# Patient Record
Sex: Male | Born: 1945 | Race: Black or African American | Hispanic: No | Marital: Married | Smoking: Current every day smoker
Health system: Southern US, Community
[De-identification: ages and names within clinical notes are randomized; demographics above are authoritative.]

## PROBLEM LIST (undated history)

## (undated) DIAGNOSIS — K635 Polyp of colon: Secondary | ICD-10-CM

## (undated) DIAGNOSIS — J449 Chronic obstructive pulmonary disease, unspecified: Secondary | ICD-10-CM

## (undated) DIAGNOSIS — M1990 Unspecified osteoarthritis, unspecified site: Secondary | ICD-10-CM

## (undated) DIAGNOSIS — K922 Gastrointestinal hemorrhage, unspecified: Secondary | ICD-10-CM

## (undated) DIAGNOSIS — I10 Essential (primary) hypertension: Secondary | ICD-10-CM

## (undated) DIAGNOSIS — I509 Heart failure, unspecified: Secondary | ICD-10-CM

## (undated) DIAGNOSIS — I3139 Other pericardial effusion (noninflammatory): Secondary | ICD-10-CM

## (undated) DIAGNOSIS — I5189 Other ill-defined heart diseases: Secondary | ICD-10-CM

## (undated) DIAGNOSIS — I469 Cardiac arrest, cause unspecified: Secondary | ICD-10-CM

## (undated) DIAGNOSIS — N184 Chronic kidney disease, stage 4 (severe): Secondary | ICD-10-CM

## (undated) DIAGNOSIS — I639 Cerebral infarction, unspecified: Secondary | ICD-10-CM

## (undated) DIAGNOSIS — N400 Enlarged prostate without lower urinary tract symptoms: Secondary | ICD-10-CM

## (undated) DIAGNOSIS — H547 Unspecified visual loss: Secondary | ICD-10-CM

## (undated) DIAGNOSIS — I96 Gangrene, not elsewhere classified: Secondary | ICD-10-CM

## (undated) DIAGNOSIS — H3552 Pigmentary retinal dystrophy: Secondary | ICD-10-CM

## (undated) DIAGNOSIS — I499 Cardiac arrhythmia, unspecified: Secondary | ICD-10-CM

## (undated) DIAGNOSIS — I313 Pericardial effusion (noninflammatory): Secondary | ICD-10-CM

## (undated) DIAGNOSIS — D649 Anemia, unspecified: Secondary | ICD-10-CM

## (undated) HISTORY — DX: Chronic obstructive pulmonary disease, unspecified: J44.9

## (undated) HISTORY — PX: CATARACT EXTRACTION W/ INTRAOCULAR LENS  IMPLANT, BILATERAL: SHX1307

## (undated) HISTORY — DX: Gastrointestinal hemorrhage, unspecified: K92.2

## (undated) HISTORY — DX: Unspecified osteoarthritis, unspecified site: M19.90

## (undated) HISTORY — DX: Cerebral infarction, unspecified: I63.9

## (undated) HISTORY — DX: Other ill-defined heart diseases: I51.89

## (undated) HISTORY — DX: Anemia, unspecified: D64.9

## (undated) HISTORY — PX: KNEE LIGAMENT RECONSTRUCTION: SHX1895

## (undated) HISTORY — DX: Pigmentary retinal dystrophy: H35.52

## (undated) HISTORY — DX: Essential (primary) hypertension: I10

---

## 1999-04-26 ENCOUNTER — Encounter: Payer: Self-pay | Admitting: Internal Medicine

## 1999-04-26 ENCOUNTER — Ambulatory Visit (HOSPITAL_COMMUNITY): Admission: RE | Admit: 1999-04-26 | Discharge: 1999-04-26 | Payer: Self-pay | Admitting: Internal Medicine

## 1999-11-08 ENCOUNTER — Ambulatory Visit (HOSPITAL_COMMUNITY): Admission: RE | Admit: 1999-11-08 | Discharge: 1999-11-08 | Payer: Self-pay | Admitting: Gastroenterology

## 1999-11-08 ENCOUNTER — Encounter (INDEPENDENT_AMBULATORY_CARE_PROVIDER_SITE_OTHER): Payer: Self-pay | Admitting: *Deleted

## 1999-12-20 ENCOUNTER — Encounter: Admission: RE | Admit: 1999-12-20 | Discharge: 2000-01-05 | Payer: Self-pay | Admitting: Internal Medicine

## 1999-12-20 ENCOUNTER — Encounter: Payer: Self-pay | Admitting: Internal Medicine

## 1999-12-20 ENCOUNTER — Ambulatory Visit (HOSPITAL_COMMUNITY): Admission: RE | Admit: 1999-12-20 | Discharge: 1999-12-20 | Payer: Self-pay | Admitting: Internal Medicine

## 2002-02-12 ENCOUNTER — Ambulatory Visit (HOSPITAL_COMMUNITY): Admission: RE | Admit: 2002-02-12 | Discharge: 2002-02-12 | Payer: Self-pay | Admitting: Gastroenterology

## 2002-02-12 ENCOUNTER — Encounter (INDEPENDENT_AMBULATORY_CARE_PROVIDER_SITE_OTHER): Payer: Self-pay | Admitting: *Deleted

## 2004-10-13 ENCOUNTER — Ambulatory Visit: Payer: Self-pay | Admitting: *Deleted

## 2004-12-20 ENCOUNTER — Ambulatory Visit: Payer: Self-pay | Admitting: Family Medicine

## 2004-12-27 ENCOUNTER — Ambulatory Visit (HOSPITAL_COMMUNITY): Admission: RE | Admit: 2004-12-27 | Discharge: 2004-12-27 | Payer: Self-pay | Admitting: Family Medicine

## 2005-10-17 ENCOUNTER — Ambulatory Visit: Payer: Self-pay | Admitting: Family Medicine

## 2005-12-18 ENCOUNTER — Ambulatory Visit: Payer: Self-pay | Admitting: Family Medicine

## 2006-05-08 ENCOUNTER — Ambulatory Visit: Payer: Self-pay | Admitting: Family Medicine

## 2006-05-15 ENCOUNTER — Ambulatory Visit (HOSPITAL_COMMUNITY): Admission: RE | Admit: 2006-05-15 | Discharge: 2006-05-15 | Payer: Self-pay | Admitting: Family Medicine

## 2007-09-24 ENCOUNTER — Ambulatory Visit: Payer: Self-pay | Admitting: Family Medicine

## 2007-11-04 ENCOUNTER — Ambulatory Visit: Payer: Self-pay | Admitting: Family Medicine

## 2007-11-04 LAB — CONVERTED CEMR LAB
Albumin: 4.1 g/dL (ref 3.5–5.2)
BUN: 17 mg/dL (ref 6–23)
Chloride: 110 meq/L (ref 96–112)
Cholesterol: 106 mg/dL (ref 0–200)
Eosinophils Relative: 0 % (ref 0–5)
HCT: 27.3 % — ABNORMAL LOW (ref 39.0–52.0)
HDL: 23 mg/dL — ABNORMAL LOW (ref >39)
Lymphocytes Relative: 20 % (ref 12–46)
Lymphs Abs: 1.3 10*3/uL (ref 0.7–4.0)
MCHC: 26.7 g/dL — ABNORMAL LOW (ref 30.0–36.0)
MCV: 78.2 fL (ref 78.0–100.0)
Monocytes Relative: 8 % (ref 3–12)
Neutro Abs: 4.6 10*3/uL (ref 1.7–7.7)
Neutrophils Relative %: 71 % (ref 43–77)
PSA: 0.79 ng/mL (ref 0.10–4.00)
Platelets: 96 10*3/uL — ABNORMAL LOW (ref 150–400)
Total Bilirubin: 0.3 mg/dL (ref 0.3–1.2)
Total CHOL/HDL Ratio: 4.6
VLDL: 32 mg/dL (ref 0–40)

## 2007-11-05 ENCOUNTER — Encounter (INDEPENDENT_AMBULATORY_CARE_PROVIDER_SITE_OTHER): Payer: Self-pay | Admitting: Family Medicine

## 2007-11-05 LAB — CONVERTED CEMR LAB
Iron: 15 ug/dL — ABNORMAL LOW (ref 42–165)
Saturation Ratios: 4 % — ABNORMAL LOW (ref 20–55)
TIBC: 373 ug/dL (ref 215–435)

## 2008-09-22 ENCOUNTER — Ambulatory Visit: Payer: Self-pay | Admitting: Family Medicine

## 2008-11-24 ENCOUNTER — Ambulatory Visit: Payer: Self-pay | Admitting: Family Medicine

## 2008-11-24 LAB — CONVERTED CEMR LAB
ALT: 18 units/L (ref 0–53)
AST: 15 units/L (ref 0–37)
Alkaline Phosphatase: 68 units/L (ref 39–117)
Basophils Relative: 0 % (ref 0–1)
CO2: 20 meq/L (ref 19–32)
Chloride: 110 meq/L (ref 96–112)
MCHC: 29.8 g/dL — ABNORMAL LOW (ref 30.0–36.0)
MCV: 88.4 fL (ref 78.0–100.0)
Monocytes Absolute: 0.6 10*3/uL (ref 0.1–1.0)
Monocytes Relative: 9 % (ref 3–12)
Potassium: 4.7 meq/L (ref 3.5–5.3)
RDW: 17.7 % — ABNORMAL HIGH (ref 11.5–15.5)
Total Bilirubin: 0.3 mg/dL (ref 0.3–1.2)
Triglycerides: 330 mg/dL — ABNORMAL HIGH (ref <150)
WBC: 6.9 10*3/uL (ref 4.0–10.5)

## 2009-01-05 ENCOUNTER — Ambulatory Visit: Payer: Self-pay | Admitting: Family Medicine

## 2009-01-05 LAB — CONVERTED CEMR LAB
Basophils Absolute: 0 10*3/uL (ref 0.0–0.1)
Basophils Relative: 0 % (ref 0–1)
Eosinophils Absolute: 0 10*3/uL (ref 0.0–0.7)
Eosinophils Relative: 0 % (ref 0–5)
HCT: 36.5 % — ABNORMAL LOW (ref 39.0–52.0)
Lymphocytes Relative: 23 % (ref 12–46)
Lymphs Abs: 1.6 10*3/uL (ref 0.7–4.0)
MCV: 89.2 fL (ref 78.0–100.0)
Monocytes Absolute: 0.6 10*3/uL (ref 0.1–1.0)
Neutro Abs: 4.9 10*3/uL (ref 1.7–7.7)
Platelets: 81 10*3/uL — ABNORMAL LOW (ref 150–400)
RBC: 4.09 M/uL — ABNORMAL LOW (ref 4.22–5.81)
WBC: 7.1 10*3/uL (ref 4.0–10.5)

## 2009-01-25 ENCOUNTER — Ambulatory Visit: Payer: Self-pay | Admitting: Hematology & Oncology

## 2009-02-09 ENCOUNTER — Ambulatory Visit: Payer: Self-pay | Admitting: Hematology and Oncology

## 2009-03-09 LAB — CBC & DIFF AND RETIC
BASO%: 0.3 % (ref 0.0–2.0)
EOS%: 0 % (ref 0.0–7.0)
IRF: 0.48 — ABNORMAL HIGH (ref 0.070–0.380)
MCH: 27.3 pg (ref 27.2–33.4)
MCHC: 31.5 g/dL — ABNORMAL LOW (ref 32.0–36.0)
MONO#: 0.5 10*3/uL (ref 0.1–0.9)
RBC: 3.88 10*6/uL — ABNORMAL LOW (ref 4.20–5.82)
RDW: 16.4 % — ABNORMAL HIGH (ref 11.0–14.6)
RETIC #: 114.8 10*3/uL — ABNORMAL HIGH (ref 31.8–103.9)
WBC: 6 10*3/uL (ref 4.0–10.3)
lymph#: 1.5 10*3/uL (ref 0.9–3.3)
nRBC: 0 % (ref 0–0)

## 2009-03-09 LAB — URINALYSIS, MICROSCOPIC - CHCC
Bilirubin (Urine): NEGATIVE
Blood: NEGATIVE
Glucose: NEGATIVE g/dL
Nitrite: NEGATIVE

## 2009-03-09 LAB — TECHNOLOGIST REVIEW

## 2009-03-11 ENCOUNTER — Ambulatory Visit (HOSPITAL_COMMUNITY): Admission: RE | Admit: 2009-03-11 | Discharge: 2009-03-11 | Payer: Self-pay | Admitting: Hematology and Oncology

## 2009-03-11 LAB — PROTEIN ELECTROPHORESIS, SERUM, WITH REFLEX
Alpha-1-Globulin: 4.2 % (ref 2.9–4.9)
Alpha-2-Globulin: 8.3 % (ref 7.1–11.8)
Gamma Globulin: 21.8 % — ABNORMAL HIGH (ref 11.1–18.8)
Total Protein, Serum Electrophoresis: 7.9 g/dL (ref 6.0–8.3)

## 2009-03-11 LAB — IRON AND TIBC
%SAT: 10 % — ABNORMAL LOW (ref 20–55)
TIBC: 347 ug/dL (ref 215–435)
UIBC: 313 ug/dL

## 2009-03-11 LAB — HEMOGLOBINOPATHY EVALUATION
Hemoglobin Other: 0 % (ref 0.0–0.0)
Hgb A2 Quant: 2.6 % (ref 2.2–3.2)
Hgb F Quant: 0 % (ref 0.0–2.0)

## 2009-03-11 LAB — DIRECT ANTIGLOBULIN TEST (NOT AT ARMC): DAT IgG: NEGATIVE

## 2009-03-11 LAB — HAPTOGLOBIN: Haptoglobin: 178 mg/dL (ref 16–200)

## 2009-03-11 LAB — VITAMIN B12: Vitamin B-12: 301 pg/mL (ref 211–911)

## 2009-03-16 ENCOUNTER — Encounter (INDEPENDENT_AMBULATORY_CARE_PROVIDER_SITE_OTHER): Payer: Self-pay | Admitting: Interventional Radiology

## 2009-03-16 ENCOUNTER — Ambulatory Visit (HOSPITAL_COMMUNITY): Admission: RE | Admit: 2009-03-16 | Discharge: 2009-03-16 | Payer: Self-pay | Admitting: Hematology and Oncology

## 2009-04-06 ENCOUNTER — Ambulatory Visit: Payer: Self-pay | Admitting: Hematology and Oncology

## 2009-05-18 ENCOUNTER — Ambulatory Visit (HOSPITAL_COMMUNITY): Admission: RE | Admit: 2009-05-18 | Discharge: 2009-05-18 | Payer: Self-pay | Admitting: Hematology and Oncology

## 2009-05-18 LAB — BASIC METABOLIC PANEL
CO2: 25 mEq/L (ref 19–32)
Calcium: 9 mg/dL (ref 8.4–10.5)
Sodium: 139 mEq/L (ref 135–145)

## 2009-05-18 LAB — IRON AND TIBC
%SAT: 22 % (ref 20–55)
TIBC: 234 ug/dL (ref 215–435)
UIBC: 183 ug/dL

## 2009-05-18 LAB — CBC WITH DIFFERENTIAL/PLATELET
BASO%: 0.2 % (ref 0.0–2.0)
EOS%: 0.2 % (ref 0.0–7.0)
Eosinophils Absolute: 0 10*3/uL (ref 0.0–0.5)
LYMPH%: 22.5 % (ref 14.0–49.0)
MCH: 29.1 pg (ref 27.2–33.4)
MCHC: 33.1 g/dL (ref 32.0–36.0)
MCV: 87.8 fL (ref 79.3–98.0)
MONO%: 8.4 % (ref 0.0–14.0)
NEUT#: 4.4 10*3/uL (ref 1.5–6.5)
RBC: 4.02 10*6/uL — ABNORMAL LOW (ref 4.20–5.82)
RDW: 17.5 % — ABNORMAL HIGH (ref 11.0–14.6)
nRBC: 0 % (ref 0–0)

## 2009-05-21 ENCOUNTER — Ambulatory Visit: Payer: Self-pay | Admitting: Hematology and Oncology

## 2009-09-10 ENCOUNTER — Ambulatory Visit: Payer: Self-pay | Admitting: Oncology

## 2009-09-15 ENCOUNTER — Encounter: Payer: Self-pay | Admitting: Internal Medicine

## 2009-09-15 ENCOUNTER — Ambulatory Visit: Payer: Self-pay | Admitting: Internal Medicine

## 2009-09-15 ENCOUNTER — Inpatient Hospital Stay (HOSPITAL_COMMUNITY): Admission: EM | Admit: 2009-09-15 | Discharge: 2009-09-20 | Payer: Self-pay | Admitting: Emergency Medicine

## 2009-09-17 ENCOUNTER — Encounter: Payer: Self-pay | Admitting: Cardiovascular Disease

## 2009-09-21 ENCOUNTER — Ambulatory Visit: Payer: Self-pay | Admitting: Cardiovascular Disease

## 2009-09-27 ENCOUNTER — Telehealth (INDEPENDENT_AMBULATORY_CARE_PROVIDER_SITE_OTHER): Payer: Self-pay | Admitting: *Deleted

## 2009-09-28 ENCOUNTER — Ambulatory Visit: Payer: Self-pay | Admitting: Cardiology

## 2009-09-28 ENCOUNTER — Encounter (HOSPITAL_COMMUNITY): Admission: RE | Admit: 2009-09-28 | Discharge: 2009-12-09 | Payer: Self-pay | Admitting: Internal Medicine

## 2009-09-28 ENCOUNTER — Ambulatory Visit: Payer: Self-pay

## 2009-09-28 DIAGNOSIS — M109 Gout, unspecified: Secondary | ICD-10-CM

## 2009-09-28 DIAGNOSIS — I110 Hypertensive heart disease with heart failure: Secondary | ICD-10-CM | POA: Insufficient documentation

## 2009-09-28 DIAGNOSIS — I313 Pericardial effusion (noninflammatory): Secondary | ICD-10-CM

## 2009-09-28 DIAGNOSIS — I509 Heart failure, unspecified: Secondary | ICD-10-CM

## 2009-09-30 ENCOUNTER — Encounter: Payer: Self-pay | Admitting: Cardiology

## 2009-09-30 ENCOUNTER — Ambulatory Visit: Payer: Self-pay

## 2009-10-01 DIAGNOSIS — Z862 Personal history of diseases of the blood and blood-forming organs and certain disorders involving the immune mechanism: Secondary | ICD-10-CM

## 2009-10-01 DIAGNOSIS — H548 Legal blindness, as defined in USA: Secondary | ICD-10-CM | POA: Insufficient documentation

## 2009-10-01 DIAGNOSIS — M1990 Unspecified osteoarthritis, unspecified site: Secondary | ICD-10-CM | POA: Insufficient documentation

## 2009-10-03 LAB — CONVERTED CEMR LAB
Basophils Relative: 0.7 % (ref 0.0–3.0)
CO2: 20 meq/L (ref 19–32)
Calcium: 8.6 mg/dL (ref 8.4–10.5)
Chloride: 112 meq/L (ref 96–112)
Creatinine, Ser: 2.2 mg/dL — ABNORMAL HIGH (ref 0.4–1.5)
Eosinophils Relative: 0.3 % (ref 0.0–5.0)
Hemoglobin: 10.8 g/dL — ABNORMAL LOW (ref 13.0–17.0)
MCV: 95 fL (ref 78.0–100.0)
Monocytes Relative: 10 % (ref 3.0–12.0)
Neutrophils Relative %: 61.7 % (ref 43.0–77.0)
Platelets: 142 10*3/uL — ABNORMAL LOW (ref 150.0–400.0)
Potassium: 4.6 meq/L (ref 3.5–5.1)

## 2009-10-04 ENCOUNTER — Ambulatory Visit: Payer: Self-pay | Admitting: Cardiology

## 2009-10-04 ENCOUNTER — Ambulatory Visit: Payer: Self-pay | Admitting: Cardiovascular Disease

## 2009-10-04 DIAGNOSIS — I5033 Acute on chronic diastolic (congestive) heart failure: Secondary | ICD-10-CM

## 2009-10-04 DIAGNOSIS — I10 Essential (primary) hypertension: Secondary | ICD-10-CM

## 2009-10-05 LAB — CONVERTED CEMR LAB
Creatinine, Ser: 2.7 mg/dL — ABNORMAL HIGH (ref 0.4–1.5)
GFR calc non Af Amer: 30.8 mL/min (ref >60)
Potassium: 4.3 meq/L (ref 3.5–5.1)
Sodium: 142 meq/L (ref 135–145)

## 2009-10-11 ENCOUNTER — Ambulatory Visit: Payer: Self-pay | Admitting: Cardiovascular Disease

## 2009-10-13 ENCOUNTER — Encounter: Payer: Self-pay | Admitting: Family Medicine

## 2009-10-13 ENCOUNTER — Encounter: Payer: Self-pay | Admitting: Cardiovascular Disease

## 2009-10-13 ENCOUNTER — Emergency Department (HOSPITAL_COMMUNITY): Admission: EM | Admit: 2009-10-13 | Discharge: 2009-10-14 | Payer: Self-pay | Admitting: Emergency Medicine

## 2009-10-13 LAB — CONVERTED CEMR LAB
BUN: 54 mg/dL — ABNORMAL HIGH (ref 6–23)
Glucose, Bld: 127 mg/dL — ABNORMAL HIGH (ref 70–99)
Potassium: 5.9 meq/L — ABNORMAL HIGH (ref 3.5–5.1)
Sodium: 143 meq/L (ref 135–145)

## 2009-10-14 ENCOUNTER — Ambulatory Visit: Payer: Self-pay | Admitting: Internal Medicine

## 2009-10-14 ENCOUNTER — Ambulatory Visit (HOSPITAL_COMMUNITY): Admission: RE | Admit: 2009-10-14 | Discharge: 2009-10-14 | Payer: Self-pay | Admitting: Cardiovascular Disease

## 2009-10-14 ENCOUNTER — Encounter: Payer: Self-pay | Admitting: Cardiovascular Disease

## 2009-10-14 ENCOUNTER — Ambulatory Visit: Payer: Self-pay

## 2009-10-27 ENCOUNTER — Encounter: Payer: Self-pay | Admitting: Cardiology

## 2009-10-27 ENCOUNTER — Telehealth (INDEPENDENT_AMBULATORY_CARE_PROVIDER_SITE_OTHER): Payer: Self-pay | Admitting: *Deleted

## 2009-11-02 ENCOUNTER — Encounter: Payer: Self-pay | Admitting: Cardiology

## 2009-12-30 ENCOUNTER — Encounter (HOSPITAL_COMMUNITY): Admission: RE | Admit: 2009-12-30 | Discharge: 2010-03-30 | Payer: Self-pay | Admitting: Nephrology

## 2010-04-19 ENCOUNTER — Ambulatory Visit: Payer: Self-pay | Admitting: Family Medicine

## 2010-06-07 ENCOUNTER — Encounter (HOSPITAL_COMMUNITY): Admission: RE | Admit: 2010-06-07 | Discharge: 2010-09-05 | Payer: Self-pay | Admitting: Nephrology

## 2010-09-14 ENCOUNTER — Encounter (HOSPITAL_COMMUNITY)
Admission: RE | Admit: 2010-09-14 | Discharge: 2010-12-13 | Payer: Self-pay | Source: Home / Self Care | Attending: Nephrology | Admitting: Nephrology

## 2011-01-13 NOTE — Letter (Signed)
Summary: [facility] Letter/Consult Report   [facility] Letter/Consult Report   Imported By: Roderic Ovens 07/04/2010 14:22:34  _____________________________________________________________________  External Attachment:    Type:   Image     Comment:   External Document

## 2011-02-20 LAB — POCT HEMOGLOBIN-HEMACUE: Hemoglobin: 11.2 g/dL — ABNORMAL LOW (ref 13.0–17.0)

## 2011-02-21 LAB — IRON AND TIBC
Iron: 66 ug/dL (ref 42–135)
TIBC: 205 ug/dL — ABNORMAL LOW (ref 215–435)

## 2011-02-21 LAB — POCT HEMOGLOBIN-HEMACUE
Hemoglobin: 11.7 g/dL — ABNORMAL LOW (ref 13.0–17.0)
Hemoglobin: 11.7 g/dL — ABNORMAL LOW (ref 13.0–17.0)

## 2011-02-22 LAB — POCT HEMOGLOBIN-HEMACUE: Hemoglobin: 10.4 g/dL — ABNORMAL LOW (ref 13.0–17.0)

## 2011-02-23 ENCOUNTER — Encounter (HOSPITAL_COMMUNITY): Payer: Medicare Other | Attending: Nephrology

## 2011-02-23 DIAGNOSIS — N184 Chronic kidney disease, stage 4 (severe): Secondary | ICD-10-CM | POA: Insufficient documentation

## 2011-02-23 DIAGNOSIS — D638 Anemia in other chronic diseases classified elsewhere: Secondary | ICD-10-CM | POA: Insufficient documentation

## 2011-02-23 LAB — IRON AND TIBC
Saturation Ratios: 16 % — ABNORMAL LOW (ref 20–55)
TIBC: 211 ug/dL — ABNORMAL LOW (ref 215–435)
UIBC: 178 ug/dL

## 2011-02-23 LAB — POCT HEMOGLOBIN-HEMACUE: Hemoglobin: 10.4 g/dL — ABNORMAL LOW (ref 13.0–17.0)

## 2011-02-24 LAB — POCT HEMOGLOBIN-HEMACUE
Hemoglobin: 11.4 g/dL — ABNORMAL LOW (ref 13.0–17.0)
Hemoglobin: 10.8 g/dL — ABNORMAL LOW (ref 13.0–17.0)

## 2011-02-25 LAB — POCT HEMOGLOBIN-HEMACUE: Hemoglobin: 10.8 g/dL — ABNORMAL LOW (ref 13.0–17.0)

## 2011-02-26 LAB — IRON AND TIBC
Iron: 60 ug/dL (ref 42–135)
TIBC: 219 ug/dL (ref 215–435)

## 2011-02-26 LAB — FERRITIN: Ferritin: 511 ng/mL — ABNORMAL HIGH (ref 22–322)

## 2011-03-01 LAB — POCT HEMOGLOBIN-HEMACUE: Hemoglobin: 10.9 g/dL — ABNORMAL LOW (ref 13.0–17.0)

## 2011-03-06 LAB — IRON AND TIBC
Saturation Ratios: 14 % — ABNORMAL LOW (ref 20–55)
TIBC: 229 ug/dL (ref 215–435)

## 2011-03-06 LAB — POCT HEMOGLOBIN-HEMACUE
Hemoglobin: 10.1 g/dL — ABNORMAL LOW (ref 13.0–17.0)
Hemoglobin: 11.3 g/dL — ABNORMAL LOW (ref 13.0–17.0)

## 2011-03-08 ENCOUNTER — Encounter (HOSPITAL_COMMUNITY): Payer: Medicare Other

## 2011-03-08 ENCOUNTER — Other Ambulatory Visit: Payer: Self-pay | Admitting: Nephrology

## 2011-03-15 LAB — URINALYSIS, ROUTINE W REFLEX MICROSCOPIC
Bilirubin Urine: NEGATIVE
Hgb urine dipstick: NEGATIVE
Protein, ur: NEGATIVE mg/dL
Urobilinogen, UA: 0.2 mg/dL (ref 0.0–1.0)

## 2011-03-15 LAB — CBC
HCT: 27.1 % — ABNORMAL LOW (ref 39.0–52.0)
Hemoglobin: 9.2 g/dL — ABNORMAL LOW (ref 13.0–17.0)
MCHC: 34.1 g/dL (ref 30.0–36.0)
RDW: 16 % — ABNORMAL HIGH (ref 11.5–15.5)

## 2011-03-15 LAB — BASIC METABOLIC PANEL
CO2: 17 mEq/L — ABNORMAL LOW (ref 19–32)
Glucose, Bld: 101 mg/dL — ABNORMAL HIGH (ref 70–99)
Potassium: 5.2 mEq/L — ABNORMAL HIGH (ref 3.5–5.1)
Sodium: 138 mEq/L (ref 135–145)

## 2011-03-15 LAB — DIFFERENTIAL
Basophils Relative: 1 % (ref 0–1)
Eosinophils Absolute: 0 10*3/uL (ref 0.0–0.7)
Eosinophils Relative: 0 % (ref 0–5)
Monocytes Absolute: 0.6 10*3/uL (ref 0.1–1.0)
Monocytes Relative: 11 % (ref 3–12)

## 2011-03-15 LAB — URINE MICROSCOPIC-ADD ON

## 2011-03-16 LAB — IRON AND TIBC
Iron: 55 ug/dL (ref 42–135)
Saturation Ratios: 25 % (ref 20–55)
TIBC: 224 ug/dL (ref 215–435)
UIBC: 169 ug/dL

## 2011-03-16 LAB — GLUCOSE, CAPILLARY
Glucose-Capillary: 113 mg/dL — ABNORMAL HIGH (ref 70–99)
Glucose-Capillary: 93 mg/dL (ref 70–99)
Glucose-Capillary: 94 mg/dL (ref 70–99)

## 2011-03-16 LAB — BASIC METABOLIC PANEL
BUN: 29 mg/dL — ABNORMAL HIGH (ref 6–23)
CO2: 21 mEq/L (ref 19–32)
CO2: 22 mEq/L (ref 19–32)
CO2: 24 mEq/L (ref 19–32)
CO2: 24 mEq/L (ref 19–32)
Calcium: 8.6 mg/dL (ref 8.4–10.5)
Calcium: 8.7 mg/dL (ref 8.4–10.5)
Calcium: 8.7 mg/dL (ref 8.4–10.5)
Calcium: 9 mg/dL (ref 8.4–10.5)
Calcium: 9.3 mg/dL (ref 8.4–10.5)
Chloride: 109 mEq/L (ref 96–112)
Chloride: 113 mEq/L — ABNORMAL HIGH (ref 96–112)
Creatinine, Ser: 1.85 mg/dL — ABNORMAL HIGH (ref 0.4–1.5)
Creatinine, Ser: 1.98 mg/dL — ABNORMAL HIGH (ref 0.4–1.5)
Creatinine, Ser: 2.23 mg/dL — ABNORMAL HIGH (ref 0.4–1.5)
GFR calc Af Amer: 36 mL/min — ABNORMAL LOW (ref >60)
GFR calc Af Amer: 42 mL/min — ABNORMAL LOW (ref >60)
GFR calc Af Amer: 45 mL/min — ABNORMAL LOW (ref >60)
GFR calc Af Amer: 47 mL/min — ABNORMAL LOW (ref >60)
GFR calc Af Amer: 48 mL/min — ABNORMAL LOW (ref >60)
GFR calc non Af Amer: 30 mL/min — ABNORMAL LOW (ref >60)
GFR calc non Af Amer: 31 mL/min — ABNORMAL LOW (ref >60)
GFR calc non Af Amer: 34 mL/min — ABNORMAL LOW (ref >60)
GFR calc non Af Amer: 37 mL/min — ABNORMAL LOW (ref >60)
Glucose, Bld: 120 mg/dL — ABNORMAL HIGH (ref 70–99)
Glucose, Bld: 94 mg/dL (ref 70–99)
Potassium: 4.4 mEq/L (ref 3.5–5.1)
Sodium: 139 mEq/L (ref 135–145)
Sodium: 140 mEq/L (ref 135–145)
Sodium: 142 mEq/L (ref 135–145)
Sodium: 142 mEq/L (ref 135–145)
Sodium: 143 mEq/L (ref 135–145)

## 2011-03-16 LAB — CARDIAC PANEL(CRET KIN+CKTOT+MB+TROPI)
CK, MB: 3.4 ng/mL (ref 0.3–4.0)
CK, MB: 3.6 ng/mL (ref 0.3–4.0)
Relative Index: 0.6 (ref 0.0–2.5)
Total CK: 594 U/L — ABNORMAL HIGH (ref 7–232)
Total CK: 989 U/L — ABNORMAL HIGH (ref 7–232)
Troponin I: 0.05 ng/mL (ref 0.00–0.06)

## 2011-03-16 LAB — CBC
HCT: 30.4 % — ABNORMAL LOW (ref 39.0–52.0)
HCT: 31.3 % — ABNORMAL LOW (ref 39.0–52.0)
Hemoglobin: 10.2 g/dL — ABNORMAL LOW (ref 13.0–17.0)
Hemoglobin: 10.9 g/dL — ABNORMAL LOW (ref 13.0–17.0)
Hemoglobin: 11.5 g/dL — ABNORMAL LOW (ref 13.0–17.0)
MCHC: 33.7 g/dL (ref 30.0–36.0)
MCHC: 33.7 g/dL (ref 30.0–36.0)
MCHC: 34.2 g/dL (ref 30.0–36.0)
MCHC: 34.4 g/dL (ref 30.0–36.0)
Platelets: 45 10*3/uL — CL (ref 150–400)
Platelets: 52 10*3/uL — ABNORMAL LOW (ref 150–400)
Platelets: 64 10*3/uL — ABNORMAL LOW (ref 150–400)
RBC: 3.11 MIL/uL — ABNORMAL LOW (ref 4.22–5.81)
RBC: 3.32 MIL/uL — ABNORMAL LOW (ref 4.22–5.81)
RDW: 16.1 % — ABNORMAL HIGH (ref 11.5–15.5)
RDW: 16.5 % — ABNORMAL HIGH (ref 11.5–15.5)
RDW: 16.5 % — ABNORMAL HIGH (ref 11.5–15.5)
WBC: 6.2 10*3/uL (ref 4.0–10.5)
WBC: 6.7 10*3/uL (ref 4.0–10.5)

## 2011-03-16 LAB — DIFFERENTIAL
Basophils Relative: 0 % (ref 0–1)
Eosinophils Relative: 0 % (ref 0–5)
Monocytes Absolute: 0.6 10*3/uL (ref 0.1–1.0)
Monocytes Relative: 12 % (ref 3–12)
Neutrophils Relative %: 62 % (ref 43–77)

## 2011-03-16 LAB — FERRITIN: Ferritin: 297 ng/mL (ref 22–322)

## 2011-03-16 LAB — TROPONIN I: Troponin I: 0.05 ng/mL (ref 0.00–0.06)

## 2011-03-16 LAB — CK TOTAL AND CKMB (NOT AT ARMC)
CK, MB: 2.9 ng/mL (ref 0.3–4.0)
Relative Index: 0.8 (ref 0.0–2.5)
Total CK: 374 U/L — ABNORMAL HIGH (ref 7–232)

## 2011-03-16 LAB — APTT: aPTT: 38 seconds — ABNORMAL HIGH (ref 24–37)

## 2011-03-16 LAB — BRAIN NATRIURETIC PEPTIDE: Pro B Natriuretic peptide (BNP): 372 pg/mL — ABNORMAL HIGH (ref 0.0–100.0)

## 2011-03-22 ENCOUNTER — Other Ambulatory Visit: Payer: Self-pay | Admitting: Nephrology

## 2011-03-22 ENCOUNTER — Encounter (HOSPITAL_COMMUNITY): Payer: Medicare Other | Attending: Nephrology

## 2011-03-22 DIAGNOSIS — D638 Anemia in other chronic diseases classified elsewhere: Secondary | ICD-10-CM | POA: Insufficient documentation

## 2011-03-22 DIAGNOSIS — N184 Chronic kidney disease, stage 4 (severe): Secondary | ICD-10-CM | POA: Insufficient documentation

## 2011-03-22 LAB — BONE MARROW EXAM

## 2011-03-22 LAB — CBC
HCT: 32 % — ABNORMAL LOW (ref 39.0–52.0)
MCV: 87.6 fL (ref 78.0–100.0)
RBC: 3.65 MIL/uL — ABNORMAL LOW (ref 4.22–5.81)
WBC: 5.9 10*3/uL (ref 4.0–10.5)

## 2011-03-22 LAB — PROTIME-INR: Prothrombin Time: 13.6 seconds (ref 11.6–15.2)

## 2011-03-22 LAB — CHROMOSOME ANALYSIS, BONE MARROW

## 2011-03-23 ENCOUNTER — Encounter (HOSPITAL_COMMUNITY): Payer: Medicare Other

## 2011-04-05 ENCOUNTER — Encounter (HOSPITAL_COMMUNITY): Payer: Medicare Other

## 2011-04-05 ENCOUNTER — Other Ambulatory Visit: Payer: Self-pay | Admitting: Nephrology

## 2011-04-05 LAB — POCT HEMOGLOBIN-HEMACUE: Hemoglobin: 12.2 g/dL — ABNORMAL LOW (ref 13.0–17.0)

## 2011-04-19 ENCOUNTER — Encounter (HOSPITAL_COMMUNITY): Payer: Medicare Other

## 2011-04-26 ENCOUNTER — Encounter (HOSPITAL_COMMUNITY): Payer: Medicare Other | Attending: Nephrology

## 2011-04-26 ENCOUNTER — Other Ambulatory Visit: Payer: Self-pay | Admitting: Nephrology

## 2011-04-26 DIAGNOSIS — D638 Anemia in other chronic diseases classified elsewhere: Secondary | ICD-10-CM | POA: Insufficient documentation

## 2011-04-26 DIAGNOSIS — N184 Chronic kidney disease, stage 4 (severe): Secondary | ICD-10-CM | POA: Insufficient documentation

## 2011-04-28 NOTE — Op Note (Signed)
[place]. [facility]  Patient:    KETAN, RENZ Visit Number: [id number] MRN: [id number]          Service Type: END Location: ENDO Attending Physician:  Orland Mustard Dictated by:   Llana Aliment. Randa Evens, M.D. Proc. Date: 02/12/02 Admit Date:  02/12/2002   CC:         Moshe Salisbury. Audria Nine, M.D.   Operative Report  PROCEDURE PERFORMED:  Colonoscopy and coagulation of polyps.  ENDOSCOPIST:  Llana Aliment. Randa Evens, M.D.  MEDICATIONS USED:  Fentanyl 50 mcg, Versed 5 mg IV, Ancef 1 gm due to intracranial shunt.  INSTRUMENT:  Pediatric Olympus video colonoscope.  INDICATIONS:  Previous history of adenomatous polyps with multiple polyps removed two years ago.  DESCRIPTION OF PROCEDURE:  The procedure had been explained to the patient and consent obtained.  With the patient in the left lateral decubitus position, the Olympus pediatric video colonoscope was inserted and advanced under direct visualization.  The prep was excellent and we were able to advance to the cecum without difficulty.  The ileocecal valve and appendiceal orifice were seen.  The scope was withdrawn.  The cecum, ascending colon, hepatic flexure, transverse colon, splenic flexure, descending and sigmoid colon were seen. Scattered diverticula in the  sigmoid colon.  In the rectum were two small 3 to 4 mm polyps that were cauterized and placed in a single jar.  No other polyps were seen.  Scope withdrawn, patient tolerated the procedure well.  ASSESSMENT:  Rectal polyps cauterized.  PLAN:  Routine postpolypectomy instructions.  Will recommend repeating procedure in three years.Dictated by:   Llana Aliment. Randa Evens, M.D. Attending Physician:  Orland Mustard DD:  02/12/02 TD:  02/12/02 Job: [id number]

## 2011-04-28 NOTE — Procedures (Signed)
[place]. [facility]  Patient:    Cameron Mendez                        MRN: [id number] Proc. Date: 11/08/99 Adm. Date:  [id number] Attending:  Orland Mustard CC:         Crista Luria, M.D., [facility]                           Procedure Report  PROCEDURE:  Colonoscopy and polypectomy with coagulation of polyp.  MEDICATIONS:  Ampicillin 1 g IV prior to the procedure due to cerebral shunt. Fentanyl 50 mcg, Versed 5 mg IV.  INDICATIONS:  Previous history of adenomatous colon polyps.  DESCRIPTION OF PROCEDURE:  Procedure has been explained to patient, consent obtained.  Patient in left lateral decubitus position.  The Olympus adult video  colonoscope inserted and advanced under direct visualization.  The colon was reasonably clean.  We were able to advance to the cecum.  A 1.5-1.75 cm sessile  polyp was encountered and removed with a snare, sucked up against the scope and  withdrawn.  We then advanced the scope back to the ascending colon, irrigating nd came back.  No other polyps were seen in the ascending colon, transverse colon r proximal descending colon.  In the distal descending colon, a 1.5 cm polyp was een and cauterized and placed in jar #2.  Approximately 10 cm distal to that, a 0.75 cm polyp was encountered was removed with a snare and sucked up against the scope nd removed.  No bleeding at these polypectomy sites and the scope was reinserted. The remainder of the sigmoid colon and rectum was unremarkable.  Patient tolerated he procedure well, maintained on low-flow oxygen and pulse oximeter, throughout the procedure with no obvious problem.  ASSESSMENT:  Multiple colon polyps removed.  PLAN:  Will remain off of nonsteroidals for one week.  Will recommend repeating in two years due to multiple polyps.  Routine postpolypectomy instructions. DD:  11/08/99 TD:  11/08/99 Job: [id number]

## 2011-05-17 ENCOUNTER — Encounter (HOSPITAL_COMMUNITY): Payer: Medicare Other | Attending: Nephrology

## 2011-05-17 ENCOUNTER — Other Ambulatory Visit: Payer: Self-pay | Admitting: Nephrology

## 2011-05-17 DIAGNOSIS — D638 Anemia in other chronic diseases classified elsewhere: Secondary | ICD-10-CM | POA: Insufficient documentation

## 2011-05-17 DIAGNOSIS — N184 Chronic kidney disease, stage 4 (severe): Secondary | ICD-10-CM | POA: Insufficient documentation

## 2011-05-17 LAB — POCT HEMOGLOBIN-HEMACUE: Hemoglobin: 11.5 g/dL — ABNORMAL LOW (ref 13.0–17.0)

## 2011-06-07 ENCOUNTER — Encounter (HOSPITAL_COMMUNITY): Payer: Medicare Other

## 2011-06-07 ENCOUNTER — Other Ambulatory Visit: Payer: Self-pay | Admitting: Nephrology

## 2011-06-07 LAB — POCT HEMOGLOBIN-HEMACUE: Hemoglobin: 12.4 g/dL — ABNORMAL LOW (ref 13.0–17.0)

## 2011-06-21 ENCOUNTER — Other Ambulatory Visit: Payer: Self-pay | Admitting: Nephrology

## 2011-06-21 ENCOUNTER — Encounter (HOSPITAL_COMMUNITY): Payer: Medicare Other | Attending: Nephrology

## 2011-06-21 DIAGNOSIS — D638 Anemia in other chronic diseases classified elsewhere: Secondary | ICD-10-CM | POA: Insufficient documentation

## 2011-06-21 DIAGNOSIS — N184 Chronic kidney disease, stage 4 (severe): Secondary | ICD-10-CM | POA: Insufficient documentation

## 2011-06-21 LAB — IRON AND TIBC: Saturation Ratios: 34 % (ref 20–55)

## 2011-06-21 LAB — FERRITIN: Ferritin: 655 ng/mL — ABNORMAL HIGH (ref 22–322)

## 2011-07-12 ENCOUNTER — Other Ambulatory Visit: Payer: Self-pay | Admitting: Nephrology

## 2011-07-12 ENCOUNTER — Encounter (HOSPITAL_COMMUNITY): Payer: Medicare Other | Attending: Nephrology

## 2011-07-12 DIAGNOSIS — D638 Anemia in other chronic diseases classified elsewhere: Secondary | ICD-10-CM | POA: Insufficient documentation

## 2011-07-12 DIAGNOSIS — N184 Chronic kidney disease, stage 4 (severe): Secondary | ICD-10-CM | POA: Insufficient documentation

## 2011-07-12 LAB — POCT HEMOGLOBIN-HEMACUE: Hemoglobin: 11.5 g/dL — ABNORMAL LOW (ref 13.0–17.0)

## 2011-08-02 ENCOUNTER — Encounter (HOSPITAL_COMMUNITY): Payer: Medicare Other

## 2011-08-09 ENCOUNTER — Encounter (HOSPITAL_COMMUNITY): Payer: Medicare Other

## 2011-08-09 ENCOUNTER — Other Ambulatory Visit: Payer: Self-pay | Admitting: Nephrology

## 2011-08-09 LAB — POCT HEMOGLOBIN-HEMACUE: Hemoglobin: 12.1 g/dL — ABNORMAL LOW (ref 13.0–17.0)

## 2011-08-23 ENCOUNTER — Encounter (HOSPITAL_COMMUNITY): Payer: Medicare Other | Attending: Nephrology

## 2011-08-23 ENCOUNTER — Other Ambulatory Visit: Payer: Self-pay | Admitting: Nephrology

## 2011-08-23 DIAGNOSIS — N184 Chronic kidney disease, stage 4 (severe): Secondary | ICD-10-CM | POA: Insufficient documentation

## 2011-08-23 DIAGNOSIS — D638 Anemia in other chronic diseases classified elsewhere: Secondary | ICD-10-CM | POA: Insufficient documentation

## 2011-09-13 ENCOUNTER — Encounter (HOSPITAL_COMMUNITY)
Admission: RE | Admit: 2011-09-13 | Discharge: 2011-09-13 | Disposition: A | Discharge status: Home / Self Care | Payer: Medicare Other | Source: Ambulatory Visit | Attending: Nephrology | Admitting: Nephrology

## 2011-09-13 DIAGNOSIS — N184 Chronic kidney disease, stage 4 (severe): Secondary | ICD-10-CM | POA: Insufficient documentation

## 2011-09-13 DIAGNOSIS — D638 Anemia in other chronic diseases classified elsewhere: Secondary | ICD-10-CM | POA: Insufficient documentation

## 2011-10-04 ENCOUNTER — Other Ambulatory Visit: Payer: Self-pay | Admitting: Nephrology

## 2011-10-04 ENCOUNTER — Encounter (HOSPITAL_COMMUNITY)
Admission: RE | Admit: 2011-10-04 | Discharge: 2011-10-04 | Payer: Medicare Other | Source: Ambulatory Visit | Attending: Nephrology | Admitting: Nephrology

## 2011-10-04 LAB — IRON AND TIBC: UIBC: 178 ug/dL (ref 125–400)

## 2011-10-12 ENCOUNTER — Encounter (HOSPITAL_COMMUNITY): Payer: Medicare Other

## 2011-10-12 ENCOUNTER — Other Ambulatory Visit: Payer: Self-pay | Admitting: Nephrology

## 2011-10-12 DIAGNOSIS — N184 Chronic kidney disease, stage 4 (severe): Secondary | ICD-10-CM | POA: Insufficient documentation

## 2011-10-12 DIAGNOSIS — D638 Anemia in other chronic diseases classified elsewhere: Secondary | ICD-10-CM | POA: Insufficient documentation

## 2011-10-12 LAB — POCT HEMOGLOBIN-HEMACUE: Hemoglobin: 10.7 g/dL — ABNORMAL LOW (ref 13.0–17.0)

## 2011-10-25 ENCOUNTER — Encounter (HOSPITAL_COMMUNITY): Payer: Medicare Other

## 2011-11-06 ENCOUNTER — Other Ambulatory Visit (HOSPITAL_COMMUNITY): Payer: Self-pay | Admitting: *Deleted

## 2011-11-08 ENCOUNTER — Encounter (HOSPITAL_COMMUNITY)
Admission: RE | Admit: 2011-11-08 | Discharge: 2011-11-08 | Disposition: A | Discharge status: Home / Self Care | Payer: Medicare Other | Source: Ambulatory Visit | Attending: Nephrology | Admitting: Nephrology

## 2011-11-08 MED ORDER — EPOETIN ALFA 20000 UNIT/ML IJ SOLN
INTRAMUSCULAR | Status: AC
  Administered 2011-11-08: 20000 [IU]
  Filled 2011-11-08: qty 1

## 2011-11-08 MED ORDER — EPOETIN ALFA 10000 UNIT/ML IJ SOLN: 20000.0000 [IU] | INTRAMUSCULAR | Status: DC

## 2011-11-27 ENCOUNTER — Other Ambulatory Visit (HOSPITAL_COMMUNITY): Payer: Self-pay | Admitting: *Deleted

## 2011-11-29 ENCOUNTER — Encounter (HOSPITAL_COMMUNITY)
Admission: RE | Admit: 2011-11-29 | Discharge: 2011-11-29 | Disposition: A | Discharge status: Home / Self Care | Payer: Medicare Other | Source: Ambulatory Visit | Attending: Nephrology | Admitting: Nephrology

## 2011-11-29 DIAGNOSIS — N184 Chronic kidney disease, stage 4 (severe): Secondary | ICD-10-CM | POA: Insufficient documentation

## 2011-11-29 DIAGNOSIS — D638 Anemia in other chronic diseases classified elsewhere: Secondary | ICD-10-CM | POA: Insufficient documentation

## 2011-11-29 LAB — BASIC METABOLIC PANEL
GFR calc Af Amer: 20 mL/min — ABNORMAL LOW (ref >90)
GFR calc non Af Amer: 17 mL/min — ABNORMAL LOW (ref >90)
Potassium: 3.7 mEq/L (ref 3.5–5.1)
Sodium: 140 mEq/L (ref 135–145)

## 2011-11-29 LAB — POCT HEMOGLOBIN-HEMACUE: Hemoglobin: 10.9 g/dL — ABNORMAL LOW (ref 13.0–17.0)

## 2011-11-29 MED ORDER — EPOETIN ALFA 20000 UNIT/ML IJ SOLN
INTRAMUSCULAR | Status: AC
  Administered 2011-11-29: 20000 [IU] via SUBCUTANEOUS
  Filled 2011-11-29: qty 1

## 2011-11-29 MED ORDER — EPOETIN ALFA 10000 UNIT/ML IJ SOLN: 20000.0000 [IU] | INTRAMUSCULAR | Status: DC

## 2011-12-18 ENCOUNTER — Other Ambulatory Visit (HOSPITAL_COMMUNITY): Payer: Self-pay | Admitting: *Deleted

## 2011-12-20 ENCOUNTER — Encounter (HOSPITAL_COMMUNITY)
Admission: RE | Admit: 2011-12-20 | Discharge: 2011-12-20 | Disposition: A | Discharge status: Home / Self Care | Payer: Medicare Other | Source: Ambulatory Visit | Attending: Nephrology | Admitting: Nephrology

## 2011-12-20 DIAGNOSIS — N184 Chronic kidney disease, stage 4 (severe): Secondary | ICD-10-CM | POA: Insufficient documentation

## 2011-12-20 DIAGNOSIS — D638 Anemia in other chronic diseases classified elsewhere: Secondary | ICD-10-CM | POA: Insufficient documentation

## 2011-12-20 LAB — BASIC METABOLIC PANEL
CO2: 24 mEq/L (ref 19–32)
Calcium: 8.9 mg/dL (ref 8.4–10.5)
Creatinine, Ser: 3.58 mg/dL — ABNORMAL HIGH (ref 0.50–1.35)
Glucose, Bld: 111 mg/dL — ABNORMAL HIGH (ref 70–99)

## 2011-12-20 LAB — IRON AND TIBC
Iron: 38 ug/dL — ABNORMAL LOW (ref 42–135)
TIBC: 187 ug/dL — ABNORMAL LOW (ref 215–435)

## 2011-12-20 MED ORDER — EPOETIN ALFA 20000 UNIT/ML IJ SOLN
INTRAMUSCULAR | Status: AC
  Administered 2011-12-20: 20000 [IU] via SUBCUTANEOUS
  Filled 2011-12-20: qty 1

## 2011-12-20 MED ORDER — EPOETIN ALFA 10000 UNIT/ML IJ SOLN: 20000.0000 [IU] | INTRAMUSCULAR | Status: DC

## 2012-01-10 ENCOUNTER — Encounter (HOSPITAL_COMMUNITY)
Admission: RE | Admit: 2012-01-10 | Discharge: 2012-01-10 | Disposition: A | Discharge status: Home / Self Care | Payer: Medicare Other | Source: Ambulatory Visit | Attending: Nephrology | Admitting: Nephrology

## 2012-01-10 MED ORDER — EPOETIN ALFA 20000 UNIT/ML IJ SOLN
INTRAMUSCULAR | Status: AC
  Administered 2012-01-10: 20000 [IU] via SUBCUTANEOUS
  Filled 2012-01-10: qty 1

## 2012-01-10 MED ORDER — EPOETIN ALFA 10000 UNIT/ML IJ SOLN: 20000.0000 [IU] | INTRAMUSCULAR | Status: DC

## 2012-01-23 ENCOUNTER — Other Ambulatory Visit (HOSPITAL_COMMUNITY): Payer: Self-pay | Admitting: *Deleted

## 2012-01-25 ENCOUNTER — Encounter (HOSPITAL_COMMUNITY)
Admission: RE | Admit: 2012-01-25 | Discharge: 2012-01-25 | Disposition: A | Discharge status: Home / Self Care | Payer: Medicare Other | Source: Ambulatory Visit | Attending: Nephrology | Admitting: Nephrology

## 2012-01-25 DIAGNOSIS — D638 Anemia in other chronic diseases classified elsewhere: Secondary | ICD-10-CM | POA: Insufficient documentation

## 2012-01-25 DIAGNOSIS — N184 Chronic kidney disease, stage 4 (severe): Secondary | ICD-10-CM | POA: Insufficient documentation

## 2012-01-25 LAB — POCT HEMOGLOBIN-HEMACUE: Hemoglobin: 11.4 g/dL — ABNORMAL LOW (ref 13.0–17.0)

## 2012-01-25 MED ORDER — FERUMOXYTOL INJECTION 510 MG/17 ML
510.0000 mg | Freq: Once | INTRAVENOUS | Status: AC
  Administered 2012-01-25: 510 mg via INTRAVENOUS

## 2012-01-25 MED ORDER — FERUMOXYTOL INJECTION 510 MG/17 ML
INTRAVENOUS | Status: AC
  Administered 2012-01-25: 510 mg via INTRAVENOUS
  Filled 2012-01-25: qty 17

## 2012-01-25 MED ORDER — EPOETIN ALFA 20000 UNIT/ML IJ SOLN
INTRAMUSCULAR | Status: AC
  Filled 2012-01-25: qty 1

## 2012-01-25 MED ORDER — EPOETIN ALFA 20000 UNIT/ML IJ SOLN
INTRAMUSCULAR | Status: AC
  Administered 2012-01-25: 20000 [IU] via SUBCUTANEOUS
  Filled 2012-01-25: qty 1

## 2012-01-25 MED ORDER — EPOETIN ALFA 10000 UNIT/ML IJ SOLN: 20000.0000 [IU] | INTRAMUSCULAR | Status: DC

## 2012-01-25 MED ORDER — FERUMOXYTOL INJECTION 510 MG/17 ML: 510.0000 mg | Freq: Once | INTRAVENOUS | Status: DC

## 2012-01-31 ENCOUNTER — Encounter (HOSPITAL_COMMUNITY): Payer: Medicare Other

## 2012-02-08 ENCOUNTER — Encounter (HOSPITAL_COMMUNITY)
Admission: RE | Admit: 2012-02-08 | Discharge: 2012-02-08 | Disposition: A | Discharge status: Home / Self Care | Payer: Medicare Other | Source: Ambulatory Visit | Attending: Nephrology | Admitting: Nephrology

## 2012-02-08 LAB — POCT HEMOGLOBIN-HEMACUE: Hemoglobin: 9.8 g/dL — ABNORMAL LOW (ref 13.0–17.0)

## 2012-02-08 MED ORDER — EPOETIN ALFA 20000 UNIT/ML IJ SOLN
INTRAMUSCULAR | Status: AC
  Administered 2012-02-08: 20000 [IU] via SUBCUTANEOUS
  Filled 2012-02-08: qty 1

## 2012-02-08 MED ORDER — EPOETIN ALFA 10000 UNIT/ML IJ SOLN: 20000.0000 [IU] | INTRAMUSCULAR | Status: DC

## 2012-02-21 ENCOUNTER — Encounter (HOSPITAL_COMMUNITY)
Admission: RE | Admit: 2012-02-21 | Discharge: 2012-02-21 | Disposition: A | Discharge status: Home / Self Care | Payer: Medicare Other | Source: Ambulatory Visit | Attending: Nephrology | Admitting: Nephrology

## 2012-02-21 DIAGNOSIS — D638 Anemia in other chronic diseases classified elsewhere: Secondary | ICD-10-CM | POA: Insufficient documentation

## 2012-02-21 DIAGNOSIS — N184 Chronic kidney disease, stage 4 (severe): Secondary | ICD-10-CM | POA: Insufficient documentation

## 2012-02-21 MED ORDER — EPOETIN ALFA 10000 UNIT/ML IJ SOLN: 20000.0000 [IU] | INTRAMUSCULAR | Status: DC

## 2012-02-21 MED ORDER — EPOETIN ALFA 20000 UNIT/ML IJ SOLN
INTRAMUSCULAR | Status: AC
  Administered 2012-02-21: 20000 [IU] via SUBCUTANEOUS
  Filled 2012-02-21: qty 1

## 2012-03-06 ENCOUNTER — Encounter (HOSPITAL_COMMUNITY): Payer: Medicare Other

## 2012-03-06 ENCOUNTER — Encounter (HOSPITAL_COMMUNITY)
Admission: RE | Admit: 2012-03-06 | Discharge: 2012-03-06 | Disposition: A | Discharge status: Home / Self Care | Payer: Medicare Other | Source: Ambulatory Visit | Attending: Nephrology | Admitting: Nephrology

## 2012-03-06 MED ORDER — EPOETIN ALFA 10000 UNIT/ML IJ SOLN: 20000.0000 [IU] | INTRAMUSCULAR | Status: DC

## 2012-03-06 MED ORDER — EPOETIN ALFA 20000 UNIT/ML IJ SOLN
INTRAMUSCULAR | Status: AC
  Administered 2012-03-06: 20000 [IU] via SUBCUTANEOUS
  Filled 2012-03-06: qty 1

## 2012-03-20 ENCOUNTER — Encounter (HOSPITAL_COMMUNITY)
Admission: RE | Admit: 2012-03-20 | Discharge: 2012-03-20 | Disposition: A | Discharge status: Home / Self Care | Payer: Medicare Other | Source: Ambulatory Visit | Attending: Nephrology | Admitting: Nephrology

## 2012-03-20 DIAGNOSIS — D638 Anemia in other chronic diseases classified elsewhere: Secondary | ICD-10-CM | POA: Insufficient documentation

## 2012-03-20 DIAGNOSIS — N184 Chronic kidney disease, stage 4 (severe): Secondary | ICD-10-CM | POA: Insufficient documentation

## 2012-03-20 LAB — IRON AND TIBC
Iron: 28 ug/dL — ABNORMAL LOW (ref 42–135)
TIBC: 186 ug/dL — ABNORMAL LOW (ref 215–435)
UIBC: 158 ug/dL (ref 125–400)

## 2012-03-20 LAB — FERRITIN: Ferritin: 989 ng/mL — ABNORMAL HIGH (ref 22–322)

## 2012-03-20 LAB — POCT HEMOGLOBIN-HEMACUE: Hemoglobin: 10.3 g/dL — ABNORMAL LOW (ref 13.0–17.0)

## 2012-03-20 MED ORDER — EPOETIN ALFA 20000 UNIT/ML IJ SOLN
INTRAMUSCULAR | Status: AC
  Administered 2012-03-20: 20000 [IU] via SUBCUTANEOUS
  Filled 2012-03-20: qty 1

## 2012-03-20 MED ORDER — EPOETIN ALFA 10000 UNIT/ML IJ SOLN: 20000.0000 [IU] | INTRAMUSCULAR | Status: DC

## 2012-04-01 ENCOUNTER — Other Ambulatory Visit (HOSPITAL_COMMUNITY): Payer: Self-pay | Admitting: *Deleted

## 2012-04-02 ENCOUNTER — Encounter (HOSPITAL_COMMUNITY): Payer: Medicare Other

## 2012-04-02 ENCOUNTER — Encounter (HOSPITAL_COMMUNITY)
Admission: RE | Admit: 2012-04-02 | Discharge: 2012-04-02 | Disposition: A | Discharge status: Home / Self Care | Payer: Medicare Other | Source: Ambulatory Visit | Attending: Nephrology | Admitting: Nephrology

## 2012-04-02 MED ORDER — EPOETIN ALFA 20000 UNIT/ML IJ SOLN
INTRAMUSCULAR | Status: AC
  Administered 2012-04-02: 20000 [IU] via SUBCUTANEOUS
  Filled 2012-04-02: qty 1

## 2012-04-02 MED ORDER — FERUMOXYTOL INJECTION 510 MG/17 ML
510.0000 mg | INTRAVENOUS | Status: DC
  Administered 2012-04-02: 510 mg via INTRAVENOUS
  Filled 2012-04-02: qty 17

## 2012-04-02 MED ORDER — SODIUM CHLORIDE 0.9 % IV SOLN
INTRAVENOUS | Status: DC
  Administered 2012-04-02: 09:00:00 via INTRAVENOUS

## 2012-04-03 ENCOUNTER — Other Ambulatory Visit: Payer: Self-pay | Admitting: Gastroenterology

## 2012-04-15 ENCOUNTER — Other Ambulatory Visit (HOSPITAL_COMMUNITY): Payer: Self-pay | Admitting: *Deleted

## 2012-04-16 ENCOUNTER — Encounter (HOSPITAL_COMMUNITY)
Admission: RE | Admit: 2012-04-16 | Discharge: 2012-04-16 | Disposition: A | Discharge status: Home / Self Care | Payer: Medicare Other | Source: Ambulatory Visit | Attending: Nephrology | Admitting: Nephrology

## 2012-04-16 DIAGNOSIS — D638 Anemia in other chronic diseases classified elsewhere: Secondary | ICD-10-CM | POA: Insufficient documentation

## 2012-04-16 DIAGNOSIS — N184 Chronic kidney disease, stage 4 (severe): Secondary | ICD-10-CM | POA: Insufficient documentation

## 2012-04-16 LAB — POCT HEMOGLOBIN-HEMACUE: Hemoglobin: 10 g/dL — ABNORMAL LOW (ref 13.0–17.0)

## 2012-04-16 MED ORDER — FERUMOXYTOL INJECTION 510 MG/17 ML
510.0000 mg | INTRAVENOUS | Status: AC
Start: 2012-04-16 — End: 2012-04-16
  Administered 2012-04-16: 510 mg via INTRAVENOUS
  Filled 2012-04-16: qty 17

## 2012-04-16 MED ORDER — SODIUM CHLORIDE 0.9 % IV SOLN
INTRAVENOUS | Status: AC
  Administered 2012-04-16: 250 mL via INTRAVENOUS

## 2012-04-16 MED ORDER — EPOETIN ALFA 20000 UNIT/ML IJ SOLN
INTRAMUSCULAR | Status: AC
  Administered 2012-04-16: 20000 [IU] via SUBCUTANEOUS
  Filled 2012-04-16: qty 1

## 2012-04-16 MED ORDER — EPOETIN ALFA 10000 UNIT/ML IJ SOLN: 20000.0000 [IU] | INTRAMUSCULAR | Status: DC

## 2012-04-30 ENCOUNTER — Encounter (HOSPITAL_COMMUNITY)
Admission: RE | Admit: 2012-04-30 | Discharge: 2012-04-30 | Disposition: A | Discharge status: Home / Self Care | Payer: Medicare Other | Source: Ambulatory Visit | Attending: Nephrology | Admitting: Nephrology

## 2012-04-30 LAB — POCT HEMOGLOBIN-HEMACUE: Hemoglobin: 10.3 g/dL — ABNORMAL LOW (ref 13.0–17.0)

## 2012-04-30 MED ORDER — EPOETIN ALFA 20000 UNIT/ML IJ SOLN
INTRAMUSCULAR | Status: AC
  Administered 2012-04-30: 20000 [IU] via SUBCUTANEOUS
  Filled 2012-04-30: qty 1

## 2012-04-30 MED ORDER — EPOETIN ALFA 10000 UNIT/ML IJ SOLN: 20000.0000 [IU] | INTRAMUSCULAR | Status: DC

## 2012-05-14 ENCOUNTER — Encounter (HOSPITAL_COMMUNITY)
Admission: RE | Admit: 2012-05-14 | Discharge: 2012-05-14 | Disposition: A | Discharge status: Home / Self Care | Payer: Medicare Other | Source: Ambulatory Visit | Attending: Nephrology | Admitting: Nephrology

## 2012-05-14 DIAGNOSIS — N184 Chronic kidney disease, stage 4 (severe): Secondary | ICD-10-CM | POA: Insufficient documentation

## 2012-05-14 DIAGNOSIS — D638 Anemia in other chronic diseases classified elsewhere: Secondary | ICD-10-CM | POA: Insufficient documentation

## 2012-05-14 MED ORDER — EPOETIN ALFA 20000 UNIT/ML IJ SOLN
INTRAMUSCULAR | Status: AC
  Administered 2012-05-14: 20000 [IU] via SUBCUTANEOUS
  Filled 2012-05-14: qty 1

## 2012-05-14 MED ORDER — EPOETIN ALFA 10000 UNIT/ML IJ SOLN: 20000.0000 [IU] | INTRAMUSCULAR | Status: DC

## 2012-05-28 ENCOUNTER — Encounter (HOSPITAL_COMMUNITY): Payer: Medicare Other

## 2012-05-29 ENCOUNTER — Encounter (HOSPITAL_COMMUNITY)
Admission: RE | Admit: 2012-05-29 | Discharge: 2012-05-29 | Disposition: A | Discharge status: Home / Self Care | Payer: Medicare Other | Source: Ambulatory Visit | Attending: Nephrology | Admitting: Nephrology

## 2012-05-29 LAB — POCT HEMOGLOBIN-HEMACUE: Hemoglobin: 9.5 g/dL — ABNORMAL LOW (ref 13.0–17.0)

## 2012-05-29 MED ORDER — EPOETIN ALFA 20000 UNIT/ML IJ SOLN
INTRAMUSCULAR | Status: AC
  Administered 2012-05-29: 20000 [IU] via SUBCUTANEOUS
  Filled 2012-05-29: qty 1

## 2012-05-29 MED ORDER — EPOETIN ALFA 10000 UNIT/ML IJ SOLN: 20000.0000 [IU] | INTRAMUSCULAR | Status: DC

## 2012-06-11 ENCOUNTER — Other Ambulatory Visit: Payer: Self-pay

## 2012-06-11 ENCOUNTER — Encounter (HOSPITAL_COMMUNITY): Payer: Medicare Other

## 2012-06-11 DIAGNOSIS — N185 Chronic kidney disease, stage 5: Secondary | ICD-10-CM

## 2012-06-11 DIAGNOSIS — Z01810 Encounter for preprocedural cardiovascular examination: Secondary | ICD-10-CM

## 2012-06-12 ENCOUNTER — Encounter: Payer: Self-pay | Admitting: Vascular Surgery

## 2012-06-17 ENCOUNTER — Encounter (HOSPITAL_COMMUNITY)
Admission: RE | Admit: 2012-06-17 | Discharge: 2012-06-17 | Disposition: A | Discharge status: Home / Self Care | Payer: Medicare Other | Source: Ambulatory Visit | Attending: Nephrology | Admitting: Nephrology

## 2012-06-17 DIAGNOSIS — N184 Chronic kidney disease, stage 4 (severe): Secondary | ICD-10-CM | POA: Insufficient documentation

## 2012-06-17 DIAGNOSIS — D638 Anemia in other chronic diseases classified elsewhere: Secondary | ICD-10-CM | POA: Insufficient documentation

## 2012-06-17 LAB — IRON AND TIBC
Iron: 51 ug/dL (ref 42–135)
Saturation Ratios: 26 % (ref 20–55)
UIBC: 147 ug/dL (ref 125–400)

## 2012-06-17 MED ORDER — EPOETIN ALFA 10000 UNIT/ML IJ SOLN
20000.0000 [IU] | INTRAMUSCULAR | Status: DC
Start: 2012-06-17 — End: 2012-06-18

## 2012-06-17 MED ORDER — EPOETIN ALFA 20000 UNIT/ML IJ SOLN
INTRAMUSCULAR | Status: AC
  Administered 2012-06-17: 20000 [IU] via SUBCUTANEOUS
  Filled 2012-06-17: qty 1

## 2012-07-01 ENCOUNTER — Encounter: Payer: Self-pay | Admitting: Vascular Surgery

## 2012-07-02 ENCOUNTER — Encounter (INDEPENDENT_AMBULATORY_CARE_PROVIDER_SITE_OTHER): Payer: Medicare Other | Admitting: *Deleted

## 2012-07-02 ENCOUNTER — Ambulatory Visit (INDEPENDENT_AMBULATORY_CARE_PROVIDER_SITE_OTHER): Payer: Medicare Other | Admitting: Vascular Surgery

## 2012-07-02 ENCOUNTER — Encounter (HOSPITAL_COMMUNITY)
Admission: RE | Admit: 2012-07-02 | Discharge: 2012-07-02 | Disposition: A | Discharge status: Home / Self Care | Payer: Medicare Other | Source: Ambulatory Visit | Attending: Nephrology | Admitting: Nephrology

## 2012-07-02 ENCOUNTER — Encounter: Payer: Self-pay | Admitting: Vascular Surgery

## 2012-07-02 VITALS — BP 118/47 | HR 67 | Resp 20 | Ht 64.0 in | Wt 163.0 lb

## 2012-07-02 DIAGNOSIS — Z01810 Encounter for preprocedural cardiovascular examination: Secondary | ICD-10-CM

## 2012-07-02 DIAGNOSIS — N186 End stage renal disease: Secondary | ICD-10-CM

## 2012-07-02 DIAGNOSIS — N185 Chronic kidney disease, stage 5: Secondary | ICD-10-CM

## 2012-07-02 MED ORDER — EPOETIN ALFA 20000 UNIT/ML IJ SOLN: 20000.0000 [IU] | INTRAMUSCULAR | Status: DC

## 2012-07-02 MED ORDER — EPOETIN ALFA 20000 UNIT/ML IJ SOLN
INTRAMUSCULAR | Status: AC
  Administered 2012-07-02: 20000 [IU] via SUBCUTANEOUS
  Filled 2012-07-02: qty 1

## 2012-07-02 NOTE — Progress Notes (Signed)
The patient has today for evaluation of AV access for hemodialysis. He has progressive chronic renal insufficiency. He is not on hemodialysis currently. He is here today with her family member and is wheelchair-bound and legally blind.  Past Medical History  Diagnosis Date  . Chronic kidney disease   . Hypertension   . Anemia   . Diastolic dysfunction   . Retinitis pigmentosa     Blindness  . Arthritis     Gout    History  Substance Use Topics  . Smoking status: Current Everyday Smoker -- 1.0 packs/day for 40 years    Types: Cigarettes  . Smokeless tobacco: Never Used  . Alcohol Use: No    Family History  Problem Relation Age of Onset  . Cancer Mother     OVARIAN    No Known Allergies  Current outpatient prescriptions:acetaminophen (TYLENOL) 325 MG tablet, Take 650 mg by mouth every 6 (six) hours as needed., Disp: , Rfl: ;  allopurinol (ZYLOPRIM) 100 MG tablet, Take 100 mg by mouth 2 (two) times daily., Disp: , Rfl: ;  amLODipine (NORVASC) 10 MG tablet, Take 10 mg by mouth daily., Disp: , Rfl: ;  aspirin 81 MG tablet, Take 81 mg by mouth daily., Disp: , Rfl:  carvedilol (COREG) 25 MG tablet, Take 25 mg by mouth 2 (two) times daily with a meal., Disp: , Rfl: ;  colchicine 0.6 MG tablet, Take 0.6 mg by mouth daily., Disp: , Rfl: ;  epoetin alfa (EPOGEN,PROCRIT) 13086 UNIT/ML injection, Inject 20,000 Units into the skin as needed., Disp: , Rfl: ;  furosemide (LASIX) 80 MG tablet, Take 80 mg by mouth 2 (two) times daily., Disp: , Rfl:  hydrALAZINE (APRESOLINE) 50 MG tablet, Take 50 mg by mouth 3 (three) times daily., Disp: , Rfl: ;  silodosin (RAPAFLO) 8 MG CAPS capsule, Take 8 mg by mouth daily with breakfast., Disp: , Rfl:  No current facility-administered medications for this visit. Facility-Administered Medications Ordered in Other Visits: epoetin alfa (EPOGEN,PROCRIT) 57846 UNIT/ML injection, , , , , 20,000 Units at 07/02/12 0920;  epoetin alfa (EPOGEN,PROCRIT) injection 20,000  Units, 20,000 Units, Subcutaneous, Q14 Days, Dyke Maes, MD  BP 118/47  Pulse 67  Resp 20  Ht 5\' 4"  (1.626 m)  Wt 163 lb (73.936 kg)  BMI 27.98 kg/m2  Body mass index is 27.98 kg/(m^2).       Review of systems positive for neck pain with ambulation and at rest. 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 He does not have large surface veins. Skin without ulcers or rashes Chest equal breath sounds without labored respirations or wheezes currently  Left arm vein mapping reveals patent cephalic vein throughout its course. It is 0.2 2.3 cm in the distal to mid forearm and greater than 3 cm from the antecubital fossa proximally  Impression and plan chronic renal insufficiency with patent cephalic vein in the left arm. I discussed options at length with the patient and his family present. I have recommended attempted fistula creation in left arm. He understands this is an outpatient procedure under local anesthesia with sedation we'll schedule this at his convenience on 07/08/2012

## 2012-07-03 ENCOUNTER — Encounter (HOSPITAL_COMMUNITY): Payer: Self-pay | Admitting: Pharmacy Technician

## 2012-07-09 ENCOUNTER — Other Ambulatory Visit: Payer: Self-pay

## 2012-07-09 NOTE — Procedures (Unsigned)
CEPHALIC VEIN MAPPING  INDICATION:  Preoperative vein mapping for dialysis access.  HISTORY: Chronic kidney disease.  EXAM:  The right cephalic and basilic veins are not evaluated.  The left cephalic vein is compressible.  Diameter measurements range from 0.49 to 0.22 cm.  The left basilic vein is compressible.  Diameter measurements range from 0.4 to 0.16 cm.  See attached worksheet for all measurements.  IMPRESSION:  Patent left cephalic and basilic veins with diameter measurements as described above.  ___________________________________________ [name], M.D.  LT/MEDQ  D:  07/03/2012  T:  07/03/2012  Job:  [id number]

## 2012-07-10 ENCOUNTER — Encounter (HOSPITAL_COMMUNITY)
Admission: RE | Admit: 2012-07-10 | Discharge: 2012-07-10 | Disposition: A | Discharge status: Home / Self Care | Payer: Medicare Other | Source: Ambulatory Visit | Attending: Vascular Surgery | Admitting: Vascular Surgery

## 2012-07-10 ENCOUNTER — Other Ambulatory Visit: Payer: Self-pay

## 2012-07-10 ENCOUNTER — Ambulatory Visit (HOSPITAL_COMMUNITY)
Admission: RE | Admit: 2012-07-10 | Discharge: 2012-07-10 | Disposition: A | Discharge status: Home / Self Care | Payer: Medicare Other | Source: Ambulatory Visit | Attending: Anesthesiology | Admitting: Anesthesiology

## 2012-07-10 ENCOUNTER — Encounter (HOSPITAL_COMMUNITY): Payer: Self-pay

## 2012-07-10 DIAGNOSIS — Z01812 Encounter for preprocedural laboratory examination: Secondary | ICD-10-CM | POA: Insufficient documentation

## 2012-07-10 DIAGNOSIS — Z01818 Encounter for other preprocedural examination: Secondary | ICD-10-CM | POA: Insufficient documentation

## 2012-07-10 NOTE — Pre-Procedure Instructions (Signed)
Cameron Mendez   07/10/2012   Your procedure is scheduled on:  Monday, AUGUST 5TH   Report to [facility] at 0830 AM.             Short Stay is now located on the 3rd floor of the hospital (3700)   Call this number if you have problems the morning of surgery: [phone]   Remember:   Do not eat food or drink any liquids:After Midnight. Sunday   Take these medicines the morning of surgery with A SIP OF WATER: Norvasc, Coreg   Do not wear jewelry, make-up or nail polish.  Do not wear lotions, powders, or perfumes. You may NOT wear deodorant.   Do not shave 48 hours prior to surgery. Men may shave face and neck.   Do not bring valuables to the hospital.  Contacts, dentures or bridgework may not be worn into surgery.   Leave suitcase in the car. After surgery it may be brought to your room.  For patients admitted to the hospital, checkout time is 11:00 AM the day of discharge.   Patients discharged the day of surgery will not be allowed to drive home.  Name and phone number of your driver:     Special Instructions: CHG Shower Use Special Wash: 1/2 bottle night before surgery and 1/2 bottle morning of surgery.   Please read over the following fact sheets that you were given: Pain Booklet, MRSA Information and Surgical Site Infection Prevention

## 2012-07-11 NOTE — Consult Note (Signed)
Anesthesia chart review: Patient is a 66 year old male scheduled for creation of a left arm arteriovenous fistula versus arteriovenous Gore-Tex graft on 07/15/2012 by Dr. Arbie Cookey.  He has not hemodialysis. History includes smoking, diastolic dysfunction, anemia, retinitis pigmentosa, hypertension, arthritis.  He is legally blind.  He reports a  history of a shunt placed in [place] many years ago to help control the pressures in his eye.  CXR on 07/10/12 showed no active disease. No significant change.   EKG on 07/10/12 showed NSR, lateral ST/T wave abnormality, consider ischemia.  It was not felt significantly changed when compared to his prior EKGs from November or October 2010.    Nuclear non-gated stress test on 09/28/09 showed:  "Originally a dobutamine stress study was started.The patient felt poorly [wheezing], and there was significant hypertension. The dobutamine was stopped. Therefore, only rest images are available. The rest images are normal. There is no obvious scar."  It was ordered due to severe HTN with pulmonary edema with underlying CKD.  He was diuresed, treated for HTN, and Nephrology consulted.  At his post-Myoview Cardiology follow-up appointment, Dr. Clifton James did not recommend a follow-up stress test at that time since he was then asymptomatic.  Echo was recommended (see below).   Echo on 10/14/09 showed: - Left ventricle: Wall thickness was increased in a pattern of severe LVH. Systolic function was vigorous. The estimated ejection fraction was in the range of 65% to 70%. Doppler parameters are consistent with a reversible restrictive pattern, indicative of decreased left ventricular diastolic compliance and/or increased left atrial pressure (grade 3 diastolic dysfunction). - Mitral valve: Mild regurgitation. - Left atrium: The atrium was moderately dilated. - Pulmonary arteries: PA peak pressure: 60mm Hg (S). - Pericardium, extracardiac: Small to moderate pericardial effusion  surrounds heart. Measures 6 to 15 mm. Largest along lateral surface of LV. No evidence of hemodynamic compromise by echo criteria. A small pericardial effusion was identified.  He is for labs on the day of surgery.  His EKG is stable.  If he does not have any new cardiopulmonary symptoms, then anticipate he can proceed as planned.  Shonna Chock, PA-C

## 2012-07-12 NOTE — Progress Notes (Signed)
Left message at [phone]. Time change to 0730.  Left callback # [phone] for pt to call and confirm receipt of this message.

## 2012-07-14 MED ORDER — CEFAZOLIN SODIUM 1-5 GM-% IV SOLN: 1.0000 g | Freq: Once | INTRAVENOUS | Status: DC

## 2012-07-14 MED ORDER — CEFAZOLIN SODIUM-DEXTROSE 2-3 GM-% IV SOLR
2.0000 g | Freq: Once | INTRAVENOUS | Status: AC
  Administered 2012-07-15: 2 g via INTRAVENOUS
  Filled 2012-07-14: qty 50

## 2012-07-15 ENCOUNTER — Encounter (HOSPITAL_COMMUNITY)
Admission: RE | Disposition: A | Discharge status: Home / Self Care | Payer: Self-pay | Source: Ambulatory Visit | Attending: Vascular Surgery

## 2012-07-15 ENCOUNTER — Telehealth: Payer: Self-pay | Admitting: Vascular Surgery

## 2012-07-15 ENCOUNTER — Encounter (HOSPITAL_COMMUNITY): Payer: Self-pay | Admitting: Vascular Surgery

## 2012-07-15 ENCOUNTER — Ambulatory Visit (HOSPITAL_COMMUNITY): Payer: Medicare Other | Admitting: Vascular Surgery

## 2012-07-15 ENCOUNTER — Ambulatory Visit (HOSPITAL_COMMUNITY)
Admission: RE | Admit: 2012-07-15 | Discharge: 2012-07-15 | Disposition: A | Discharge status: Home / Self Care | Payer: Medicare Other | Source: Ambulatory Visit | Attending: Vascular Surgery | Admitting: Vascular Surgery

## 2012-07-15 ENCOUNTER — Other Ambulatory Visit (HOSPITAL_COMMUNITY): Payer: Self-pay | Admitting: *Deleted

## 2012-07-15 DIAGNOSIS — H3552 Pigmentary retinal dystrophy: Secondary | ICD-10-CM | POA: Insufficient documentation

## 2012-07-15 DIAGNOSIS — N186 End stage renal disease: Secondary | ICD-10-CM

## 2012-07-15 DIAGNOSIS — M129 Arthropathy, unspecified: Secondary | ICD-10-CM | POA: Insufficient documentation

## 2012-07-15 DIAGNOSIS — M1990 Unspecified osteoarthritis, unspecified site: Secondary | ICD-10-CM | POA: Insufficient documentation

## 2012-07-15 DIAGNOSIS — K219 Gastro-esophageal reflux disease without esophagitis: Secondary | ICD-10-CM | POA: Insufficient documentation

## 2012-07-15 DIAGNOSIS — I509 Heart failure, unspecified: Secondary | ICD-10-CM | POA: Insufficient documentation

## 2012-07-15 DIAGNOSIS — F17200 Nicotine dependence, unspecified, uncomplicated: Secondary | ICD-10-CM | POA: Insufficient documentation

## 2012-07-15 DIAGNOSIS — I120 Hypertensive chronic kidney disease with stage 5 chronic kidney disease or end stage renal disease: Secondary | ICD-10-CM | POA: Insufficient documentation

## 2012-07-15 HISTORY — PX: AV FISTULA PLACEMENT: SHX1204

## 2012-07-15 LAB — POCT I-STAT 4, (NA,K, GLUC, HGB,HCT)
HCT: 32 % — ABNORMAL LOW (ref 39.0–52.0)
Hemoglobin: 10.9 g/dL — ABNORMAL LOW (ref 13.0–17.0)

## 2012-07-15 SURGERY — ARTERIOVENOUS (AV) FISTULA CREATION
Anesthesia: Monitor Anesthesia Care | Site: Arm Lower | Laterality: Left | Wound class: Clean

## 2012-07-15 MED ORDER — LIDOCAINE-EPINEPHRINE 0.5 %-1:200000 IJ SOLN
INTRAMUSCULAR | Status: DC | PRN
  Administered 2012-07-15: 6 mL

## 2012-07-15 MED ORDER — FENTANYL CITRATE 0.05 MG/ML IJ SOLN
INTRAMUSCULAR | Status: DC | PRN
  Administered 2012-07-15: 50 ug via INTRAVENOUS

## 2012-07-15 MED ORDER — SODIUM CHLORIDE 0.9 % IV SOLN
INTRAVENOUS | Status: DC | PRN
  Administered 2012-07-15: 12:00:00 via INTRAVENOUS

## 2012-07-15 MED ORDER — SODIUM CHLORIDE 0.9 % IR SOLN
Status: DC | PRN
  Administered 2012-07-15: 12:00:00

## 2012-07-15 MED ORDER — OXYCODONE HCL 5 MG PO TABS: 5.0000 mg | ORAL_TABLET | ORAL | Status: AC | PRN

## 2012-07-15 MED ORDER — MIDAZOLAM HCL 5 MG/5ML IJ SOLN
INTRAMUSCULAR | Status: DC | PRN
  Administered 2012-07-15: 0.5 mg via INTRAVENOUS

## 2012-07-15 MED ORDER — ACETAMINOPHEN 10 MG/ML IV SOLN
INTRAVENOUS | Status: AC
  Filled 2012-07-15: qty 100

## 2012-07-15 MED ORDER — PROPOFOL 10 MG/ML IV EMUL
INTRAVENOUS | Status: DC | PRN
  Administered 2012-07-15: 25 ug/kg/min via INTRAVENOUS

## 2012-07-15 MED ORDER — 0.9 % SODIUM CHLORIDE (POUR BTL) OPTIME
TOPICAL | Status: DC | PRN
  Administered 2012-07-15: 1000 mL

## 2012-07-15 MED ORDER — SODIUM CHLORIDE 0.9 % IV SOLN
INTRAVENOUS | Status: DC
  Administered 2012-07-15: 11:00:00 via INTRAVENOUS

## 2012-07-15 MED ORDER — DEXTROSE 5 % IV SOLN
INTRAVENOUS | Status: DC | PRN
  Administered 2012-07-15: 12:00:00 via INTRAVENOUS

## 2012-07-15 SURGICAL SUPPLY — 37 items
APL SKNCLS STERI-STRIP NONHPOA (GAUZE/BANDAGES/DRESSINGS) ×1
BENZOIN TINCTURE PRP APPL 2/3 (GAUZE/BANDAGES/DRESSINGS) ×2 IMPLANT
CANISTER SUCTION 2500CC (MISCELLANEOUS) ×2 IMPLANT
CLIP LIGATING EXTRA MED SLVR (CLIP) ×2 IMPLANT
CLIP LIGATING EXTRA SM BLUE (MISCELLANEOUS) ×2 IMPLANT
CLOTH BEACON ORANGE TIMEOUT ST (SAFETY) ×2 IMPLANT
CLSR STERI-STRIP ANTIMIC 1/2X4 (GAUZE/BANDAGES/DRESSINGS) ×1 IMPLANT
COVER PROBE W GEL 5X96 (DRAPES) ×2 IMPLANT
COVER SURGICAL LIGHT HANDLE (MISCELLANEOUS) ×2 IMPLANT
DECANTER SPIKE VIAL GLASS SM (MISCELLANEOUS) ×2 IMPLANT
ELECT REM PT RETURN 9FT ADLT (ELECTROSURGICAL) ×2
ELECTRODE REM PT RTRN 9FT ADLT (ELECTROSURGICAL) ×1 IMPLANT
GEL ULTRASOUND 20GR AQUASONIC (MISCELLANEOUS) IMPLANT
GLOVE BIOGEL PI IND STRL 6.5 (GLOVE) IMPLANT
GLOVE BIOGEL PI IND STRL 7.5 (GLOVE) IMPLANT
GLOVE BIOGEL PI INDICATOR 6.5 (GLOVE) ×2
GLOVE BIOGEL PI INDICATOR 7.5 (GLOVE) ×2
GLOVE SS BIOGEL STRL SZ 7 (GLOVE) IMPLANT
GLOVE SS BIOGEL STRL SZ 7.5 (GLOVE) ×1 IMPLANT
GLOVE SUPERSENSE BIOGEL SZ 7 (GLOVE) ×1
GLOVE SUPERSENSE BIOGEL SZ 7.5 (GLOVE) ×1
GOWN STRL NON-REIN LRG LVL3 (GOWN DISPOSABLE) ×4 IMPLANT
KIT BASIN OR (CUSTOM PROCEDURE TRAY) ×2 IMPLANT
KIT ROOM TURNOVER OR (KITS) ×2 IMPLANT
NS IRRIG 1000ML POUR BTL (IV SOLUTION) ×2 IMPLANT
PACK CV ACCESS (CUSTOM PROCEDURE TRAY) ×2 IMPLANT
PAD ARMBOARD 7.5X6 YLW CONV (MISCELLANEOUS) ×4 IMPLANT
SPONGE GAUZE 4X4 12PLY (GAUZE/BANDAGES/DRESSINGS) ×2 IMPLANT
STRIP CLOSURE SKIN 1/2X4 (GAUZE/BANDAGES/DRESSINGS) ×2 IMPLANT
SUT PROLENE 6 0 CC (SUTURE) ×2 IMPLANT
SUT VIC AB 3-0 SH 27 (SUTURE) ×2
SUT VIC AB 3-0 SH 27X BRD (SUTURE) ×1 IMPLANT
TAPE CLOTH SURG 4X10 WHT LF (GAUZE/BANDAGES/DRESSINGS) ×1 IMPLANT
TOWEL OR 17X24 6PK STRL BLUE (TOWEL DISPOSABLE) ×2 IMPLANT
TOWEL OR 17X26 10 PK STRL BLUE (TOWEL DISPOSABLE) ×2 IMPLANT
UNDERPAD 30X30 INCONTINENT (UNDERPADS AND DIAPERS) ×2 IMPLANT
WATER STERILE IRR 1000ML POUR (IV SOLUTION) ×2 IMPLANT

## 2012-07-15 NOTE — Anesthesia Preprocedure Evaluation (Addendum)
Anesthesia Evaluation  Patient identified by MRN, date of birth, ID band Patient awake    Reviewed: Allergy & Precautions, H&P , NPO status , Patient's Chart, lab work & pertinent test results, reviewed documented beta blocker date and time   History of Anesthesia Complications Negative for: history of anesthetic complications  Airway Mallampati: II TM Distance: >3 FB Neck ROM: Full    Dental  (+) Edentulous Upper, Poor Dentition, Loose and Dental Advisory Given,    Pulmonary Current Smoker,          Cardiovascular hypertension, Pt. on medications and Pt. on home beta blockers +CHF     Neuro/Psych negative neurological ROS  negative psych ROS   GI/Hepatic Neg liver ROS, GERD-  Controlled,  Endo/Other  negative endocrine ROS  Renal/GU CRFRenal disease     Musculoskeletal  (+) Arthritis -, Osteoarthritis,    Abdominal   Peds  Hematology negative hematology ROS (+)   Anesthesia Other Findings   Reproductive/Obstetrics                          Anesthesia Physical Anesthesia Plan  ASA: III  Anesthesia Plan: MAC   Post-op Pain Management:    Induction: Intravenous  Airway Management Planned: Mask  Additional Equipment:   Intra-op Plan:   Post-operative Plan:   Informed Consent: I have reviewed the patients History and Physical, chart, labs and discussed the procedure including the risks, benefits and alternatives for the proposed anesthesia with the patient or authorized representative who has indicated his/her understanding and acceptance.   Dental advisory given  Plan Discussed with: CRNA, Anesthesiologist and Surgeon  Anesthesia Plan Comments:         Anesthesia Quick Evaluation

## 2012-07-15 NOTE — Anesthesia Postprocedure Evaluation (Signed)
  Anesthesia Post-op Note  Patient: Cameron Mendez  Procedure(s) Performed: Procedure(s) (LRB): ARTERIOVENOUS (AV) FISTULA CREATION (Left)  Patient Location: PACU  Anesthesia Type: MAC  Level of Consciousness: awake  Airway and Oxygen Therapy: Patient Spontanous Breathing  Post-op Pain: mild  Post-op Assessment: Post-op Vital signs reviewed  Post-op Vital Signs: Reviewed  Complications: No apparent anesthesia complications

## 2012-07-15 NOTE — Op Note (Signed)
OPERATIVE REPORT  DATE OF SURGERY: 07/15/2012  PATIENT: Cameron Mendez, 66 y.o. male MRN: [id number]  DOB: [birth year]  PRE-OPERATIVE DIAGNOSIS: Chronic renal insufficiency  POST-OPERATIVE DIAGNOSIS:  Same  PROCEDURE: Left upper arm AV fistula creation  SURGEON:  Gretta Began, M.D.  PHYSICIAN ASSISTANT: Collins  ANESTHESIA:  Local with sedation  EBL: Minimal ml  Total I/O In: 50 [I.V.:50] Out: -   BLOOD ADMINISTERED: None  DRAINS: None  SPECIMEN: None  COUNTS CORRECT:  YES  PLAN OF CARE: PACU   PATIENT DISPOSITION:  PACU - hemodynamically stable  PROCEDURE DETAILS: The patient was taken to the operating room placed supine position where the area the left arm was prepped and draped in usual sterile fashion. SonoSite ultrasound was used to visualize the cephalic vein. This was small throughout the course of the forearm but was better at the antecubital space and proximally. Using local anesthesia an incision was made over the antecubital space and carried down to isolate the cephalic vein and the brachial artery. Tributary branches of the cephalic vein were ligated and divided. The vein was divided distally and mobilized to the level of the brachial artery. The brachial artery was occluded proximal and distally was opened with an 11 blade and sent longitudinally with Potts scissors. The vein was sewn end-to-side to the artery with a running 6-0 Prolene suture. Clamps removed and good flow was noted through the fistula. Wounds were irrigated with saline. Hemostasis was obtained with electrocautery. The wounds were closed with 3-0 Vicryl in the subcutaneous and subcuticular tissue. Benzoin and Steri-Strips were applied.   Gretta Began, M.D. 07/15/2012 12:53 PM

## 2012-07-15 NOTE — Interval H&P Note (Signed)
History and Physical Interval Note:  07/15/2012 8:54 AM  Cameron Mendez  has presented today for surgery, with the diagnosis of End Stage Renal Disease  The various methods of treatment have been discussed with the patient and family. After consideration of risks, benefits and other options for treatment, the patient has consented to  Procedure(s) (LRB): ARTERIOVENOUS (AV) FISTULA CREATION (Left) as a surgical intervention .  The patient's history has been reviewed, patient examined, no change in status, stable for surgery.  I have reviewed the patient's chart and labs.  Questions were answered to the patient's satisfaction.     [name]

## 2012-07-15 NOTE — Transfer of Care (Signed)
Immediate Anesthesia Transfer of Care Note  Patient: Cameron Mendez  Procedure(s) Performed: Procedure(s) (LRB): ARTERIOVENOUS (AV) FISTULA CREATION (Left)  Patient Location: PACU  Anesthesia Type: MAC  Level of Consciousness: sedated and patient cooperative  Airway & Oxygen Therapy: Patient Spontanous Breathing and Patient connected to face mask oxygen  Post-op Assessment: Report given to PACU RN and Post -op Vital signs reviewed and stable  Post vital signs: Reviewed and stable  Complications: No apparent anesthesia complications

## 2012-07-15 NOTE — H&P (View-Only) (Signed)
The patient has today for evaluation of AV access for hemodialysis. He has progressive chronic renal insufficiency. He is not on hemodialysis currently. He is here today with her family member and is wheelchair-bound and legally blind.  Past Medical History  Diagnosis Date  . Chronic kidney disease   . Hypertension   . Anemia   . Diastolic dysfunction   . Retinitis pigmentosa     Blindness  . Arthritis     Gout    History  Substance Use Topics  . Smoking status: Current Everyday Smoker -- 1.0 packs/day for 40 years    Types: Cigarettes  . Smokeless tobacco: Never Used  . Alcohol Use: No    Family History  Problem Relation Age of Onset  . Cancer Mother     OVARIAN    No Known Allergies  Current outpatient prescriptions:acetaminophen (TYLENOL) 325 MG tablet, Take 650 mg by mouth every 6 (six) hours as needed., Disp: , Rfl: ;  allopurinol (ZYLOPRIM) 100 MG tablet, Take 100 mg by mouth 2 (two) times daily., Disp: , Rfl: ;  amLODipine (NORVASC) 10 MG tablet, Take 10 mg by mouth daily., Disp: , Rfl: ;  aspirin 81 MG tablet, Take 81 mg by mouth daily., Disp: , Rfl:  carvedilol (COREG) 25 MG tablet, Take 25 mg by mouth 2 (two) times daily with a meal., Disp: , Rfl: ;  colchicine 0.6 MG tablet, Take 0.6 mg by mouth daily., Disp: , Rfl: ;  epoetin alfa (EPOGEN,PROCRIT) 20000 UNIT/ML injection, Inject 20,000 Units into the skin as needed., Disp: , Rfl: ;  furosemide (LASIX) 80 MG tablet, Take 80 mg by mouth 2 (two) times daily., Disp: , Rfl:  hydrALAZINE (APRESOLINE) 50 MG tablet, Take 50 mg by mouth 3 (three) times daily., Disp: , Rfl: ;  silodosin (RAPAFLO) 8 MG CAPS capsule, Take 8 mg by mouth daily with breakfast., Disp: , Rfl:  No current facility-administered medications for this visit. Facility-Administered Medications Ordered in Other Visits: epoetin alfa (EPOGEN,PROCRIT) 20000 UNIT/ML injection, , , , , 20,000 Units at 07/02/12 0920;  epoetin alfa (EPOGEN,PROCRIT) injection 20,000  Units, 20,000 Units, Subcutaneous, Q14 Days, Michael T Mattingly, MD  BP 118/47  Pulse 67  Resp 20  Ht 5' 4" (1.626 m)  Wt 163 lb (73.936 kg)  BMI 27.98 kg/m2  Body mass index is 27.98 kg/(m^2).       Review of systems positive for neck pain with ambulation and at rest. 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 He does not have large surface veins. Skin without ulcers or rashes Chest equal breath sounds without labored respirations or wheezes currently  Left arm vein mapping reveals patent cephalic vein throughout its course. It is 0.2 2.3 cm in the distal to mid forearm and greater than 3 cm from the antecubital fossa proximally  Impression and plan chronic renal insufficiency with patent cephalic vein in the left arm. I discussed options at length with the patient and his family present. I have recommended attempted fistula creation in left arm. He understands this is an outpatient procedure under local anesthesia with sedation we'll schedule this at his convenience on 07/08/2012 

## 2012-07-15 NOTE — Preoperative (Signed)
Beta Blockers   Reason not to administer Beta Blockers:Not Applicable 

## 2012-07-15 NOTE — Telephone Encounter (Addendum)
Message copied by Rosalyn Charters on Mon Jul 15, 2012  3:23 PM ------      Message from: Phillips Odor      Created: Mon Jul 15, 2012 12:55 PM                   ----- Message -----         From: Lars Mage, PA         Sent: 07/15/2012  12:49 PM           To: Melene Plan, RN            Left AVfistula upper arm F/U with Dr. Arbie Cookey in 4 weeks.  notified patient of fu appt in 4 wks on 08-13-12 11:45am

## 2012-07-16 ENCOUNTER — Encounter (HOSPITAL_COMMUNITY): Payer: Self-pay | Admitting: Vascular Surgery

## 2012-07-16 ENCOUNTER — Encounter (HOSPITAL_COMMUNITY): Payer: Medicare Other

## 2012-07-25 ENCOUNTER — Encounter (HOSPITAL_COMMUNITY)
Admission: RE | Admit: 2012-07-25 | Discharge: 2012-07-25 | Disposition: A | Discharge status: Home / Self Care | Payer: Medicare Other | Source: Ambulatory Visit | Attending: Nephrology | Admitting: Nephrology

## 2012-07-25 DIAGNOSIS — N184 Chronic kidney disease, stage 4 (severe): Secondary | ICD-10-CM | POA: Insufficient documentation

## 2012-07-25 DIAGNOSIS — D638 Anemia in other chronic diseases classified elsewhere: Secondary | ICD-10-CM | POA: Insufficient documentation

## 2012-07-25 LAB — POCT HEMOGLOBIN-HEMACUE: Hemoglobin: 7.5 g/dL — ABNORMAL LOW (ref 13.0–17.0)

## 2012-07-25 MED ORDER — EPOETIN ALFA 10000 UNIT/ML IJ SOLN: 20000.0000 [IU] | INTRAMUSCULAR | Status: DC

## 2012-07-25 MED ORDER — EPOETIN ALFA 20000 UNIT/ML IJ SOLN
INTRAMUSCULAR | Status: AC
  Administered 2012-07-25: 20000 [IU] via SUBCUTANEOUS
  Filled 2012-07-25: qty 1

## 2012-08-06 ENCOUNTER — Encounter (HOSPITAL_COMMUNITY)
Admission: RE | Admit: 2012-08-06 | Discharge: 2012-08-06 | Disposition: A | Discharge status: Home / Self Care | Payer: Medicare Other | Source: Ambulatory Visit | Attending: Nephrology | Admitting: Nephrology

## 2012-08-06 LAB — ABO/RH: ABO/RH(D): O POS

## 2012-08-06 MED ORDER — EPOETIN ALFA 20000 UNIT/ML IJ SOLN
INTRAMUSCULAR | Status: AC
  Administered 2012-08-06: 20000 [IU]
  Filled 2012-08-06: qty 1

## 2012-08-06 MED ORDER — EPOETIN ALFA 10000 UNIT/ML IJ SOLN: 20000.0000 [IU] | INTRAMUSCULAR | Status: DC

## 2012-08-07 ENCOUNTER — Encounter (HOSPITAL_COMMUNITY)
Admission: RE | Admit: 2012-08-07 | Discharge: 2012-08-07 | Disposition: A | Discharge status: Home / Self Care | Payer: Medicare Other | Source: Ambulatory Visit | Attending: Nephrology | Admitting: Nephrology

## 2012-08-07 LAB — POCT HEMOGLOBIN-HEMACUE: Hemoglobin: 6.9 g/dL — CL (ref 13.0–17.0)

## 2012-08-07 MED ORDER — FUROSEMIDE 10 MG/ML IJ SOLN
80.0000 mg | Freq: Once | INTRAMUSCULAR | Status: AC
  Administered 2012-08-07: 80 mg via INTRAVENOUS
  Filled 2012-08-07 (×2): qty 8

## 2012-08-07 NOTE — Progress Notes (Signed)
Lungs clear to auscultation bilaterally. 

## 2012-08-08 LAB — TYPE AND SCREEN
Antibody Screen: NEGATIVE
Unit division: 0

## 2012-08-09 ENCOUNTER — Encounter: Payer: Self-pay | Admitting: Vascular Surgery

## 2012-08-13 ENCOUNTER — Encounter: Payer: Self-pay | Admitting: Vascular Surgery

## 2012-08-13 ENCOUNTER — Ambulatory Visit (INDEPENDENT_AMBULATORY_CARE_PROVIDER_SITE_OTHER): Payer: Medicare Other | Admitting: Vascular Surgery

## 2012-08-13 VITALS — BP 145/63 | HR 78 | Resp 18 | Ht 63.0 in | Wt 138.5 lb

## 2012-08-13 DIAGNOSIS — N186 End stage renal disease: Secondary | ICD-10-CM

## 2012-08-13 NOTE — Progress Notes (Signed)
Patient is here today with his granddaughter for followup of left upper arm AV fistula creation. Surgery was 07/08/2012. He has good healing of his antecubital wound. He has excellent Sunnie Odden maturation of the cephalic vein. He is thin and has very superficial cephalic vein and it runs in a very straight course. He has an excellent thrill in the fistula. The size is approximately 5-6 mm in diameter. He was instructed not continued use of left arm exercise and will followup US on an as-needed basis. I feel he is a very good chance that this will be adequate for hemodialysis should he progressed to renal failure

## 2012-08-20 ENCOUNTER — Encounter (HOSPITAL_COMMUNITY)
Admission: RE | Admit: 2012-08-20 | Discharge: 2012-08-20 | Disposition: A | Discharge status: Home / Self Care | Payer: Medicare Other | Source: Ambulatory Visit | Attending: Nephrology | Admitting: Nephrology

## 2012-08-20 DIAGNOSIS — N184 Chronic kidney disease, stage 4 (severe): Secondary | ICD-10-CM | POA: Insufficient documentation

## 2012-08-20 DIAGNOSIS — D638 Anemia in other chronic diseases classified elsewhere: Secondary | ICD-10-CM | POA: Insufficient documentation

## 2012-08-20 LAB — POCT HEMOGLOBIN-HEMACUE: Hemoglobin: 10.2 g/dL — ABNORMAL LOW (ref 13.0–17.0)

## 2012-08-20 MED ORDER — EPOETIN ALFA 20000 UNIT/ML IJ SOLN
INTRAMUSCULAR | Status: AC
  Administered 2012-08-20: 20000 [IU] via SUBCUTANEOUS
  Filled 2012-08-20: qty 1

## 2012-08-20 MED ORDER — EPOETIN ALFA 10000 UNIT/ML IJ SOLN: 20000.0000 [IU] | INTRAMUSCULAR | Status: DC

## 2012-09-03 ENCOUNTER — Encounter (HOSPITAL_COMMUNITY)
Admission: RE | Admit: 2012-09-03 | Discharge: 2012-09-03 | Disposition: A | Discharge status: Home / Self Care | Payer: Medicare Other | Source: Ambulatory Visit | Attending: Nephrology | Admitting: Nephrology

## 2012-09-03 LAB — POCT HEMOGLOBIN-HEMACUE: Hemoglobin: 9.9 g/dL — ABNORMAL LOW (ref 13.0–17.0)

## 2012-09-03 MED ORDER — EPOETIN ALFA 20000 UNIT/ML IJ SOLN
INTRAMUSCULAR | Status: AC
  Administered 2012-09-03: 20000 [IU] via SUBCUTANEOUS
  Filled 2012-09-03: qty 1

## 2012-09-03 MED ORDER — EPOETIN ALFA 10000 UNIT/ML IJ SOLN: 20000.0000 [IU] | INTRAMUSCULAR | Status: DC

## 2012-09-17 ENCOUNTER — Encounter (HOSPITAL_COMMUNITY)
Admission: RE | Admit: 2012-09-17 | Discharge: 2012-09-17 | Disposition: A | Discharge status: Home / Self Care | Payer: Medicare Other | Source: Ambulatory Visit | Attending: Nephrology | Admitting: Nephrology

## 2012-09-17 DIAGNOSIS — N184 Chronic kidney disease, stage 4 (severe): Secondary | ICD-10-CM | POA: Insufficient documentation

## 2012-09-17 DIAGNOSIS — D638 Anemia in other chronic diseases classified elsewhere: Secondary | ICD-10-CM | POA: Insufficient documentation

## 2012-09-17 LAB — IRON AND TIBC
Iron: 39 ug/dL — ABNORMAL LOW (ref 42–135)
Saturation Ratios: 23 % (ref 20–55)
UIBC: 133 ug/dL (ref 125–400)

## 2012-09-17 MED ORDER — EPOETIN ALFA 10000 UNIT/ML IJ SOLN: 20000.0000 [IU] | INTRAMUSCULAR | Status: DC

## 2012-09-17 MED ORDER — EPOETIN ALFA 20000 UNIT/ML IJ SOLN
INTRAMUSCULAR | Status: AC
  Administered 2012-09-17: 20000 [IU] via SUBCUTANEOUS
  Filled 2012-09-17: qty 1

## 2012-09-18 ENCOUNTER — Encounter (HOSPITAL_COMMUNITY): Payer: Self-pay | Admitting: Emergency Medicine

## 2012-09-18 ENCOUNTER — Emergency Department (HOSPITAL_COMMUNITY)
Admission: EM | Admit: 2012-09-18 | Discharge: 2012-09-19 | Disposition: A | Discharge status: Home / Self Care | Payer: Medicare Other | Attending: Emergency Medicine | Admitting: Emergency Medicine

## 2012-09-18 DIAGNOSIS — Z7982 Long term (current) use of aspirin: Secondary | ICD-10-CM | POA: Insufficient documentation

## 2012-09-18 DIAGNOSIS — N189 Chronic kidney disease, unspecified: Secondary | ICD-10-CM | POA: Insufficient documentation

## 2012-09-18 DIAGNOSIS — L02519 Cutaneous abscess of unspecified hand: Secondary | ICD-10-CM | POA: Insufficient documentation

## 2012-09-18 DIAGNOSIS — Z992 Dependence on renal dialysis: Secondary | ICD-10-CM | POA: Insufficient documentation

## 2012-09-18 DIAGNOSIS — L0390 Cellulitis, unspecified: Secondary | ICD-10-CM

## 2012-09-18 DIAGNOSIS — L988 Other specified disorders of the skin and subcutaneous tissue: Secondary | ICD-10-CM | POA: Insufficient documentation

## 2012-09-18 DIAGNOSIS — L03119 Cellulitis of unspecified part of limb: Secondary | ICD-10-CM | POA: Insufficient documentation

## 2012-09-18 DIAGNOSIS — M25529 Pain in unspecified elbow: Secondary | ICD-10-CM | POA: Insufficient documentation

## 2012-09-18 DIAGNOSIS — M7989 Other specified soft tissue disorders: Secondary | ICD-10-CM | POA: Insufficient documentation

## 2012-09-18 DIAGNOSIS — I129 Hypertensive chronic kidney disease with stage 1 through stage 4 chronic kidney disease, or unspecified chronic kidney disease: Secondary | ICD-10-CM | POA: Insufficient documentation

## 2012-09-18 DIAGNOSIS — Z79899 Other long term (current) drug therapy: Secondary | ICD-10-CM | POA: Insufficient documentation

## 2012-09-18 DIAGNOSIS — T148XXA Other injury of unspecified body region, initial encounter: Secondary | ICD-10-CM

## 2012-09-18 DIAGNOSIS — H3552 Pigmentary retinal dystrophy: Secondary | ICD-10-CM | POA: Insufficient documentation

## 2012-09-18 MED ORDER — ENOXAPARIN SODIUM 100 MG/ML ~~LOC~~ SOLN
60.0000 mg | Freq: Once | SUBCUTANEOUS | Status: AC
  Administered 2012-09-19: 60 mg via SUBCUTANEOUS
  Filled 2012-09-18: qty 1

## 2012-09-18 MED ORDER — OXYCODONE-ACETAMINOPHEN 5-325 MG PO TABS
1.0000 | ORAL_TABLET | Freq: Once | ORAL | Status: AC
  Administered 2012-09-18: 1 via ORAL
  Filled 2012-09-18: qty 1
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
## 2012-09-19 ENCOUNTER — Ambulatory Visit (HOSPITAL_COMMUNITY)
Admission: RE | Admit: 2012-09-19 | Discharge: 2012-09-19 | Disposition: A | Discharge status: Home / Self Care | Payer: Medicare Other | Source: Ambulatory Visit | Attending: Family Medicine | Admitting: Family Medicine

## 2012-09-19 DIAGNOSIS — M7989 Other specified soft tissue disorders: Secondary | ICD-10-CM | POA: Insufficient documentation

## 2012-09-19 MED ORDER — CEPHALEXIN 500 MG PO CAPS: 500.0000 mg | ORAL_CAPSULE | Freq: Four times a day (QID) | ORAL | Status: DC

## 2012-09-19 NOTE — ED Notes (Signed)
PA at bedside for procedure.

## 2012-09-19 NOTE — ED Provider Notes (Signed)
Medical screening examination/treatment/procedure(s) were conducted as a shared visit with non-physician practitioner(s) and myself.  I personally evaluated the patient during the encounter.  I saw the patient along with C. Schinlever.  Cameron Mendez.  Cameron Mendez.  Cameron started with pain and swelling of the Mendez two days ago.  On exam, the patient is afebrile and the vitals are stable.  The arm appears to have mild swelling of the Mendez and distal forearm.  There is a palpable thrill and distal pulses are intact.  The concern at this point is for dvt.  Cameron will be brought back in the am for a vascular study to rule this out.  Cameron was given lovenox and will be discharged to home.  Geoffery Lyons, MD 09/19/12 1911

## 2012-09-19 NOTE — Progress Notes (Signed)
Right:  No evidence of DVT or superficial thrombosis.    

## 2012-10-01 ENCOUNTER — Encounter (HOSPITAL_COMMUNITY)
Admission: RE | Admit: 2012-10-01 | Discharge: 2012-10-01 | Disposition: A | Discharge status: Home / Self Care | Payer: Medicare Other | Source: Ambulatory Visit | Attending: Nephrology | Admitting: Nephrology

## 2012-10-01 MED ORDER — EPOETIN ALFA 20000 UNIT/ML IJ SOLN
INTRAMUSCULAR | Status: AC
  Administered 2012-10-01: 20000 [IU] via SUBCUTANEOUS
  Filled 2012-10-01: qty 1

## 2012-10-01 MED ORDER — EPOETIN ALFA 10000 UNIT/ML IJ SOLN: 20000.0000 [IU] | INTRAMUSCULAR | Status: DC

## 2012-10-14 ENCOUNTER — Other Ambulatory Visit (HOSPITAL_COMMUNITY): Payer: Self-pay | Admitting: *Deleted

## 2012-10-15 ENCOUNTER — Encounter (HOSPITAL_COMMUNITY)
Admission: RE | Admit: 2012-10-15 | Discharge: 2012-10-15 | Disposition: A | Discharge status: Home / Self Care | Payer: Medicare Other | Source: Ambulatory Visit | Attending: Nephrology | Admitting: Nephrology

## 2012-10-15 DIAGNOSIS — D638 Anemia in other chronic diseases classified elsewhere: Secondary | ICD-10-CM | POA: Insufficient documentation

## 2012-10-15 DIAGNOSIS — N184 Chronic kidney disease, stage 4 (severe): Secondary | ICD-10-CM | POA: Insufficient documentation

## 2012-10-15 MED ORDER — DARBEPOETIN ALFA-POLYSORBATE 200 MCG/0.4ML IJ SOLN: 200.0000 ug | INTRAMUSCULAR | Status: DC

## 2012-10-15 MED ORDER — DARBEPOETIN ALFA-POLYSORBATE 200 MCG/0.4ML IJ SOLN
INTRAMUSCULAR | Status: AC
  Filled 2012-10-15: qty 0.4

## 2012-10-15 MED ORDER — DARBEPOETIN ALFA-POLYSORBATE 200 MCG/0.4ML IJ SOLN
200.0000 ug | Freq: Once | INTRAMUSCULAR | Status: DC
  Administered 2012-10-15: 200 ug via SUBCUTANEOUS

## 2012-10-29 ENCOUNTER — Encounter (HOSPITAL_COMMUNITY)
Admission: RE | Admit: 2012-10-29 | Discharge: 2012-10-29 | Disposition: A | Discharge status: Home / Self Care | Payer: Medicare Other | Source: Ambulatory Visit | Attending: Nephrology | Admitting: Nephrology

## 2012-10-29 MED ORDER — DARBEPOETIN ALFA-POLYSORBATE 200 MCG/0.4ML IJ SOLN
INTRAMUSCULAR | Status: AC
  Filled 2012-10-29: qty 0.4

## 2012-10-29 MED ORDER — DARBEPOETIN ALFA-POLYSORBATE 200 MCG/0.4ML IJ SOLN
200.0000 ug | INTRAMUSCULAR | Status: DC
  Administered 2012-10-29: 200 ug via SUBCUTANEOUS

## 2012-10-31 ENCOUNTER — Ambulatory Visit
Admission: RE | Admit: 2012-10-31 | Discharge: 2012-10-31 | Disposition: A | Discharge status: Home / Self Care | Payer: Medicare Other | Source: Ambulatory Visit | Attending: Family Medicine | Admitting: Family Medicine

## 2012-10-31 ENCOUNTER — Other Ambulatory Visit: Payer: Self-pay | Admitting: Family Medicine

## 2012-10-31 DIAGNOSIS — M79645 Pain in left finger(s): Secondary | ICD-10-CM

## 2012-11-12 ENCOUNTER — Encounter (HOSPITAL_COMMUNITY)
Admission: RE | Admit: 2012-11-12 | Discharge: 2012-11-12 | Disposition: A | Discharge status: Home / Self Care | Payer: Medicare Other | Source: Ambulatory Visit | Attending: Nephrology | Admitting: Nephrology

## 2012-11-12 DIAGNOSIS — D638 Anemia in other chronic diseases classified elsewhere: Secondary | ICD-10-CM | POA: Insufficient documentation

## 2012-11-12 DIAGNOSIS — N184 Chronic kidney disease, stage 4 (severe): Secondary | ICD-10-CM | POA: Insufficient documentation

## 2012-11-12 LAB — POCT HEMOGLOBIN-HEMACUE: Hemoglobin: 8.7 g/dL — ABNORMAL LOW (ref 13.0–17.0)

## 2012-11-12 MED ORDER — DARBEPOETIN ALFA-POLYSORBATE 200 MCG/0.4ML IJ SOLN
200.0000 ug | INTRAMUSCULAR | Status: DC
  Administered 2012-11-12: 200 ug via SUBCUTANEOUS

## 2012-11-12 MED ORDER — DARBEPOETIN ALFA-POLYSORBATE 200 MCG/0.4ML IJ SOLN
INTRAMUSCULAR | Status: AC
  Administered 2012-11-12: 200 ug via SUBCUTANEOUS
  Filled 2012-11-12: qty 0.4

## 2012-11-19 ENCOUNTER — Ambulatory Visit (INDEPENDENT_AMBULATORY_CARE_PROVIDER_SITE_OTHER): Payer: Medicare Other | Admitting: Vascular Surgery

## 2012-11-19 DIAGNOSIS — I96 Gangrene, not elsewhere classified: Secondary | ICD-10-CM

## 2012-11-19 DIAGNOSIS — M79609 Pain in unspecified limb: Secondary | ICD-10-CM

## 2012-11-19 DIAGNOSIS — I998 Other disorder of circulatory system: Secondary | ICD-10-CM

## 2012-11-27 ENCOUNTER — Encounter (HOSPITAL_COMMUNITY)
Admission: RE | Admit: 2012-11-27 | Discharge: 2012-11-27 | Disposition: A | Discharge status: Home / Self Care | Payer: Medicare Other | Source: Ambulatory Visit | Attending: Nephrology | Admitting: Nephrology

## 2012-11-27 LAB — POCT HEMOGLOBIN-HEMACUE: Hemoglobin: 9.3 g/dL — ABNORMAL LOW (ref 13.0–17.0)

## 2012-11-27 MED ORDER — DARBEPOETIN ALFA-POLYSORBATE 200 MCG/0.4ML IJ SOLN
INTRAMUSCULAR | Status: AC
  Administered 2012-11-27: 200 ug via SUBCUTANEOUS
  Filled 2012-11-27: qty 0.4

## 2012-11-27 MED ORDER — DARBEPOETIN ALFA-POLYSORBATE 200 MCG/0.4ML IJ SOLN
200.0000 ug | INTRAMUSCULAR | Status: DC
  Administered 2012-11-27: 200 ug via SUBCUTANEOUS

## 2012-12-12 ENCOUNTER — Encounter (HOSPITAL_COMMUNITY): Payer: Medicare Other

## 2012-12-18 ENCOUNTER — Other Ambulatory Visit: Payer: Self-pay | Admitting: Orthopedic Surgery

## 2012-12-24 ENCOUNTER — Encounter (HOSPITAL_COMMUNITY)
Admission: RE | Admit: 2012-12-24 | Discharge: 2012-12-24 | Disposition: A | Discharge status: Home / Self Care | Payer: Medicare Other | Source: Ambulatory Visit | Attending: Nephrology | Admitting: Nephrology

## 2012-12-24 DIAGNOSIS — N184 Chronic kidney disease, stage 4 (severe): Secondary | ICD-10-CM | POA: Insufficient documentation

## 2012-12-24 DIAGNOSIS — D638 Anemia in other chronic diseases classified elsewhere: Secondary | ICD-10-CM | POA: Insufficient documentation

## 2012-12-24 LAB — IRON AND TIBC
Saturation Ratios: 20 % (ref 20–55)
TIBC: 167 ug/dL — ABNORMAL LOW (ref 215–435)

## 2012-12-24 MED ORDER — DARBEPOETIN ALFA-POLYSORBATE 200 MCG/0.4ML IJ SOLN
INTRAMUSCULAR | Status: AC
  Filled 2012-12-24: qty 0.4

## 2012-12-24 MED ORDER — DARBEPOETIN ALFA-POLYSORBATE 200 MCG/0.4ML IJ SOLN
200.0000 ug | INTRAMUSCULAR | Status: DC
  Administered 2012-12-24: 200 ug via SUBCUTANEOUS

## 2012-12-25 ENCOUNTER — Encounter (HOSPITAL_BASED_OUTPATIENT_CLINIC_OR_DEPARTMENT_OTHER): Payer: Self-pay | Admitting: *Deleted

## 2012-12-25 NOTE — Progress Notes (Signed)
Pt had lt upper arm av shunt put in 7/13-no on dialysis yet. Needs istat ekg done 8/13 Pt blind

## 2012-12-30 ENCOUNTER — Encounter (HOSPITAL_BASED_OUTPATIENT_CLINIC_OR_DEPARTMENT_OTHER)
Admission: RE | Disposition: A | Discharge status: Home / Self Care | Payer: Self-pay | Source: Ambulatory Visit | Attending: Orthopedic Surgery

## 2012-12-30 ENCOUNTER — Ambulatory Visit (HOSPITAL_BASED_OUTPATIENT_CLINIC_OR_DEPARTMENT_OTHER): Payer: Medicare Other | Admitting: Anesthesiology

## 2012-12-30 ENCOUNTER — Encounter (HOSPITAL_BASED_OUTPATIENT_CLINIC_OR_DEPARTMENT_OTHER): Payer: Self-pay | Admitting: Anesthesiology

## 2012-12-30 ENCOUNTER — Ambulatory Visit (HOSPITAL_BASED_OUTPATIENT_CLINIC_OR_DEPARTMENT_OTHER)
Admission: RE | Admit: 2012-12-30 | Discharge: 2012-12-30 | Disposition: A | Discharge status: Home / Self Care | Payer: Medicare Other | Source: Ambulatory Visit | Attending: Orthopedic Surgery | Admitting: Orthopedic Surgery

## 2012-12-30 ENCOUNTER — Encounter (HOSPITAL_BASED_OUTPATIENT_CLINIC_OR_DEPARTMENT_OTHER): Payer: Self-pay | Admitting: *Deleted

## 2012-12-30 DIAGNOSIS — N189 Chronic kidney disease, unspecified: Secondary | ICD-10-CM | POA: Insufficient documentation

## 2012-12-30 DIAGNOSIS — I129 Hypertensive chronic kidney disease with stage 1 through stage 4 chronic kidney disease, or unspecified chronic kidney disease: Secondary | ICD-10-CM | POA: Insufficient documentation

## 2012-12-30 DIAGNOSIS — F17200 Nicotine dependence, unspecified, uncomplicated: Secondary | ICD-10-CM | POA: Insufficient documentation

## 2012-12-30 DIAGNOSIS — Z7982 Long term (current) use of aspirin: Secondary | ICD-10-CM | POA: Insufficient documentation

## 2012-12-30 DIAGNOSIS — J4489 Other specified chronic obstructive pulmonary disease: Secondary | ICD-10-CM | POA: Insufficient documentation

## 2012-12-30 DIAGNOSIS — I96 Gangrene, not elsewhere classified: Secondary | ICD-10-CM | POA: Insufficient documentation

## 2012-12-30 DIAGNOSIS — Z79899 Other long term (current) drug therapy: Secondary | ICD-10-CM | POA: Insufficient documentation

## 2012-12-30 DIAGNOSIS — J449 Chronic obstructive pulmonary disease, unspecified: Secondary | ICD-10-CM | POA: Insufficient documentation

## 2012-12-30 HISTORY — DX: Benign prostatic hyperplasia without lower urinary tract symptoms: N40.0

## 2012-12-30 HISTORY — DX: Unspecified visual loss: H54.7

## 2012-12-30 HISTORY — PX: AMPUTATION: SHX166

## 2012-12-30 LAB — POCT I-STAT, CHEM 8
Chloride: 111 mEq/L (ref 96–112)
HCT: 31 % — ABNORMAL LOW (ref 39.0–52.0)
Potassium: 4.7 mEq/L (ref 3.5–5.1)
Sodium: 144 mEq/L (ref 135–145)

## 2012-12-30 SURGERY — AMPUTATION DIGIT
Anesthesia: General | Site: Finger | Laterality: Left | Wound class: Clean

## 2012-12-30 MED ORDER — SODIUM CHLORIDE 0.9 % IV SOLN
INTRAVENOUS | Status: DC
  Administered 2012-12-30: 08:00:00 via INTRAVENOUS

## 2012-12-30 MED ORDER — OXYCODONE HCL 5 MG/5ML PO SOLN: 5.0000 mg | Freq: Once | ORAL | Status: DC | PRN

## 2012-12-30 MED ORDER — CEFAZOLIN SODIUM-DEXTROSE 2-3 GM-% IV SOLR
2.0000 g | Freq: Once | INTRAVENOUS | Status: AC
  Administered 2012-12-30: 2 g via INTRAVENOUS

## 2012-12-30 MED ORDER — FENTANYL CITRATE 0.05 MG/ML IJ SOLN
INTRAMUSCULAR | Status: DC | PRN
  Administered 2012-12-30: 25 ug via INTRAVENOUS

## 2012-12-30 MED ORDER — EPHEDRINE SULFATE 50 MG/ML IJ SOLN
INTRAMUSCULAR | Status: DC | PRN
  Administered 2012-12-30: 10 mg via INTRAVENOUS

## 2012-12-30 MED ORDER — DOXYCYCLINE HYCLATE 100 MG PO TABS: 100.0000 mg | ORAL_TABLET | Freq: Two times a day (BID) | ORAL | Status: DC

## 2012-12-30 MED ORDER — HYDROMORPHONE HCL PF 1 MG/ML IJ SOLN: 0.2500 mg | INTRAMUSCULAR | Status: DC | PRN

## 2012-12-30 MED ORDER — PROMETHAZINE HCL 25 MG/ML IJ SOLN: 6.2500 mg | INTRAMUSCULAR | Status: DC | PRN

## 2012-12-30 MED ORDER — PROPOFOL 10 MG/ML IV BOLUS
INTRAVENOUS | Status: DC | PRN
  Administered 2012-12-30: 150 mg via INTRAVENOUS

## 2012-12-30 MED ORDER — LIDOCAINE HCL (CARDIAC) 20 MG/ML IV SOLN
INTRAVENOUS | Status: DC | PRN
  Administered 2012-12-30: 80 mg via INTRAVENOUS

## 2012-12-30 MED ORDER — OXYCODONE HCL 5 MG PO TABS: 5.0000 mg | ORAL_TABLET | Freq: Once | ORAL | Status: DC | PRN

## 2012-12-30 MED ORDER — LACTATED RINGERS IV SOLN: INTRAVENOUS | Status: DC

## 2012-12-30 MED ORDER — ONDANSETRON HCL 4 MG/2ML IJ SOLN
INTRAMUSCULAR | Status: DC | PRN
  Administered 2012-12-30: 4 mg via INTRAVENOUS

## 2012-12-30 MED ORDER — MIDAZOLAM HCL 2 MG/2ML IJ SOLN: 1.0000 mg | INTRAMUSCULAR | Status: DC | PRN

## 2012-12-30 MED ORDER — HYDROCODONE-ACETAMINOPHEN 5-325 MG PO TABS: 1.0000 | ORAL_TABLET | Freq: Four times a day (QID) | ORAL | Status: DC | PRN

## 2012-12-30 MED ORDER — DEXAMETHASONE SODIUM PHOSPHATE 4 MG/ML IJ SOLN
INTRAMUSCULAR | Status: DC | PRN
  Administered 2012-12-30: 10 mg via INTRAVENOUS

## 2012-12-30 MED ORDER — FENTANYL CITRATE 0.05 MG/ML IJ SOLN: 50.0000 ug | Freq: Once | INTRAMUSCULAR | Status: DC

## 2012-12-30 MED ORDER — BUPIVACAINE HCL (PF) 0.25 % IJ SOLN
INTRAMUSCULAR | Status: DC | PRN
  Administered 2012-12-30: 7 mL

## 2012-12-30 SURGICAL SUPPLY — 53 items
BANDAGE ELASTIC 3 VELCRO ST LF (GAUZE/BANDAGES/DRESSINGS) IMPLANT
BANDAGE GAUZE STRT 1 STR LF (GAUZE/BANDAGES/DRESSINGS) ×1 IMPLANT
BLADE MINI RND TIP GREEN BEAV (BLADE) IMPLANT
BLADE SURG 15 STRL LF DISP TIS (BLADE) ×2 IMPLANT
BLADE SURG 15 STRL SS (BLADE) ×4
BNDG CMPR 9X4 STRL LF SNTH (GAUZE/BANDAGES/DRESSINGS) ×1
BNDG CMPR MD 5X2 ELC HKLP STRL (GAUZE/BANDAGES/DRESSINGS)
BNDG COHESIVE 1X5 TAN STRL LF (GAUZE/BANDAGES/DRESSINGS) ×2 IMPLANT
BNDG ELASTIC 2 VLCR STRL LF (GAUZE/BANDAGES/DRESSINGS) IMPLANT
BNDG ESMARK 4X9 LF (GAUZE/BANDAGES/DRESSINGS) ×1 IMPLANT
CHLORAPREP W/TINT 26ML (MISCELLANEOUS) ×2 IMPLANT
CLOTH BEACON ORANGE TIMEOUT ST (SAFETY) ×2 IMPLANT
CORDS BIPOLAR (ELECTRODE) ×2 IMPLANT
COVER MAYO STAND STRL (DRAPES) ×2 IMPLANT
COVER TABLE BACK 60X90 (DRAPES) ×2 IMPLANT
DECANTER SPIKE VIAL GLASS SM (MISCELLANEOUS) IMPLANT
DRAIN PENROSE 1/4X12 LTX STRL (WOUND CARE) IMPLANT
DRAPE EXTREMITY T 121X128X90 (DRAPE) ×2 IMPLANT
DRAPE OEC MINIVIEW 54X84 (DRAPES) IMPLANT
DRAPE SURG 17X23 STRL (DRAPES) ×2 IMPLANT
GAUZE SPONGE 4X4 12PLY STRL LF (GAUZE/BANDAGES/DRESSINGS) IMPLANT
GAUZE XEROFORM 1X8 LF (GAUZE/BANDAGES/DRESSINGS) ×2 IMPLANT
GLOVE BIO SURGEON STRL SZ 6.5 (GLOVE) ×1 IMPLANT
GLOVE BIO SURGEON STRL SZ7.5 (GLOVE) ×2 IMPLANT
GLOVE BIOGEL PI IND STRL 7.0 (GLOVE) IMPLANT
GLOVE BIOGEL PI IND STRL 8 (GLOVE) ×1 IMPLANT
GLOVE BIOGEL PI INDICATOR 7.0 (GLOVE) ×1
GLOVE BIOGEL PI INDICATOR 8 (GLOVE) ×1
GOWN PREVENTION PLUS XLARGE (GOWN DISPOSABLE) ×2 IMPLANT
GOWN PREVENTION PLUS XXLARGE (GOWN DISPOSABLE) ×1 IMPLANT
GOWN STRL REIN XL XLG (GOWN DISPOSABLE) ×2 IMPLANT
NS IRRIG 1000ML POUR BTL (IV SOLUTION) ×2 IMPLANT
PACK BASIN DAY SURGERY FS (CUSTOM PROCEDURE TRAY) ×2 IMPLANT
PADDING CAST ABS 4INX4YD NS (CAST SUPPLIES) ×1
PADDING CAST ABS COTTON 4X4 ST (CAST SUPPLIES) ×1 IMPLANT
SPLINT FINGER 5/8X2.25 (CAST SUPPLIES) IMPLANT
SPLINT FNGR BALL END 5/8X4.25 (SOFTGOODS) IMPLANT
SPLINT PLASTALUME 2 1/4 (CAST SUPPLIES) ×2
SPLINT PLASTALUME BALL 4 1/4IN (SOFTGOODS)
SPONGE GAUZE 4X4 12PLY (GAUZE/BANDAGES/DRESSINGS) ×2 IMPLANT
STOCKINETTE 4X48 STRL (DRAPES) ×2 IMPLANT
SUT CHROMIC 4 0 P 3 18 (SUTURE) IMPLANT
SUT ETHILON 3 0 PS 1 (SUTURE) IMPLANT
SUT ETHILON 4 0 PS 2 18 (SUTURE) IMPLANT
SUT MERSILENE 4 0 P 3 (SUTURE) IMPLANT
SUT MON AB 5-0 P3 18 (SUTURE) ×2 IMPLANT
SUT VIC AB 4-0 P-3 18XBRD (SUTURE) IMPLANT
SUT VIC AB 4-0 P3 18 (SUTURE)
SYR BULB 3OZ (MISCELLANEOUS) ×2 IMPLANT
SYR CONTROL 10ML LL (SYRINGE) ×1 IMPLANT
TOWEL OR 17X24 6PK STRL BLUE (TOWEL DISPOSABLE) ×2 IMPLANT
UNDERPAD 30X30 INCONTINENT (UNDERPADS AND DIAPERS) ×2 IMPLANT
WATER STERILE IRR 1000ML POUR (IV SOLUTION) ×1 IMPLANT

## 2012-12-30 NOTE — Op Note (Signed)
[id number] 

## 2012-12-30 NOTE — Transfer of Care (Signed)
Immediate Anesthesia Transfer of Care Note  Patient: Cameron Mendez  Procedure(s) Performed: Procedure(s) (LRB) with comments: AMPUTATION DIGIT (Left) - LEFT SMALL FINGER AMPUTATION  Patient Location: PACU  Anesthesia Type:General  Level of Consciousness: sedated  Airway & Oxygen Therapy: Patient Spontanous Breathing and Patient connected to face mask oxygen  Post-op Assessment: Report given to PACU RN and Post -op Vital signs reviewed and stable  Post vital signs: Reviewed and stable  Complications: No apparent anesthesia complications
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
## 2012-12-30 NOTE — Anesthesia Postprocedure Evaluation (Signed)
  Anesthesia Post-op Note  Patient: Cameron Mendez  Procedure(s) Performed: Procedure(s) (LRB) with comments: AMPUTATION DIGIT (Left) - LEFT SMALL FINGER AMPUTATION  Patient Location: PACU  Anesthesia Type:General  Level of Consciousness: awake  Airway and Oxygen Therapy: Patient Spontanous Breathing  Post-op Pain: mild  Post-op Assessment: Post-op Vital signs reviewed, Patient's Cardiovascular Status Stable, Respiratory Function Stable, Patent Airway, No signs of Nausea or vomiting, Adequate PO intake and Pain level controlled  Post-op Vital Signs: stable  Complications: No apparent anesthesia complications
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
## 2012-12-30 NOTE — Brief Op Note (Signed)
12/30/2012  9:44 AM  PATIENT:  Ander Slade  67 y.o. male  PRE-OPERATIVE DIAGNOSIS:  LEFT SMALL FINGER GANGRENE  POST-OPERATIVE DIAGNOSIS:  LEFT SMALL FINGER GANGRENE  PROCEDURE:  Procedure(s) (LRB) with comments: AMPUTATION DIGIT (Left) - LEFT SMALL FINGER AMPUTATION  SURGEON:  Surgeon(s) and Role:    * [name], MD - Primary  PHYSICIAN ASSISTANT:   ASSISTANTS: none   ANESTHESIA:   general  EBL:     BLOOD ADMINISTERED:none  DRAINS: none   LOCAL MEDICATIONS USED:  MARCAINE     SPECIMEN:  Source of Specimen:  left small finger  DISPOSITION OF SPECIMEN:  PATHOLOGY  COUNTS:  YES  TOURNIQUET:   Total Tourniquet Time Documented: Forearm (Left) - 12 minutes  DICTATION: .Other Dictation: Dictation Number [id number]  PLAN OF CARE: Discharge to home after PACU  PATIENT DISPOSITION:  PACU - hemodynamically stable.

## 2012-12-30 NOTE — Anesthesia Procedure Notes (Signed)
Procedure Name: LMA Insertion Date/Time: 12/30/2012 9:03 AM Performed by: Gar Gibbon Pre-anesthesia Checklist: Patient identified, Emergency Drugs available, Suction available and Patient being monitored Patient Re-evaluated:Patient Re-evaluated prior to inductionOxygen Delivery Method: Circle System Utilized Preoxygenation: Pre-oxygenation with 100% oxygen Intubation Type: IV induction Ventilation: Mask ventilation without difficulty LMA: LMA inserted LMA Size: 4.0 Number of attempts: 1 Airway Equipment and Method: bite block Placement Confirmation: positive ETCO2 Tube secured with: Tape Dental Injury: Teeth and Oropharynx as per pre-operative assessment

## 2012-12-30 NOTE — Anesthesia Preprocedure Evaluation (Signed)
Anesthesia Evaluation  Patient identified by MRN, date of birth, ID band Patient awake    Reviewed: Allergy & Precautions, H&P , NPO status , Patient's Chart, lab work & pertinent test results  Airway Mallampati: I TM Distance: >3 FB Neck ROM: Full    Dental   Pulmonary shortness of breath, COPDCurrent Smoker,  + rhonchi         Cardiovascular hypertension, Rate:Normal     Neuro/Psych    GI/Hepatic   Endo/Other    Renal/GU CRFRenal disease     Musculoskeletal   Abdominal   Peds  Hematology   Anesthesia Other Findings   Reproductive/Obstetrics                           Anesthesia Physical Anesthesia Plan  ASA: III  Anesthesia Plan: General   Post-op Pain Management:    Induction: Intravenous  Airway Management Planned: LMA  Additional Equipment:   Intra-op Plan:   Post-operative Plan: Extubation in OR  Informed Consent: I have reviewed the patients History and Physical, chart, labs and discussed the procedure including the risks, benefits and alternatives for the proposed anesthesia with the patient or authorized representative who has indicated his/her understanding and acceptance.     Plan Discussed with: Surgeon and CRNA  Anesthesia Plan Comments:         Anesthesia Quick Evaluation

## 2013-01-01 ENCOUNTER — Encounter (HOSPITAL_BASED_OUTPATIENT_CLINIC_OR_DEPARTMENT_OTHER): Payer: Self-pay | Admitting: Orthopedic Surgery

## 2013-01-07 ENCOUNTER — Encounter (HOSPITAL_COMMUNITY): Payer: Medicare Other

## 2013-01-08 ENCOUNTER — Other Ambulatory Visit (HOSPITAL_COMMUNITY): Payer: Self-pay | Admitting: *Deleted

## 2013-01-09 ENCOUNTER — Encounter (HOSPITAL_COMMUNITY)
Admission: RE | Admit: 2013-01-09 | Discharge: 2013-01-09 | Disposition: A | Discharge status: Home / Self Care | Payer: Medicare Other | Source: Ambulatory Visit | Attending: Nephrology | Admitting: Nephrology

## 2013-01-09 MED ORDER — DARBEPOETIN ALFA-POLYSORBATE 200 MCG/0.4ML IJ SOLN
INTRAMUSCULAR | Status: AC
  Administered 2013-01-09: 200 ug via SUBCUTANEOUS
  Filled 2013-01-09: qty 0.4

## 2013-01-09 MED ORDER — DARBEPOETIN ALFA-POLYSORBATE 200 MCG/0.4ML IJ SOLN: 200.0000 ug | INTRAMUSCULAR | Status: DC

## 2013-01-23 ENCOUNTER — Encounter (HOSPITAL_COMMUNITY): Payer: Medicare Other

## 2013-02-04 ENCOUNTER — Encounter (HOSPITAL_COMMUNITY)
Admission: RE | Admit: 2013-02-04 | Discharge: 2013-02-04 | Disposition: A | Discharge status: Home / Self Care | Payer: Medicare Other | Source: Ambulatory Visit | Attending: Nephrology | Admitting: Nephrology

## 2013-02-04 DIAGNOSIS — D638 Anemia in other chronic diseases classified elsewhere: Secondary | ICD-10-CM | POA: Insufficient documentation

## 2013-02-04 DIAGNOSIS — N184 Chronic kidney disease, stage 4 (severe): Secondary | ICD-10-CM | POA: Insufficient documentation

## 2013-02-04 MED ORDER — DARBEPOETIN ALFA-POLYSORBATE 200 MCG/0.4ML IJ SOLN
200.0000 ug | INTRAMUSCULAR | Status: DC
  Administered 2013-02-04: 200 ug via SUBCUTANEOUS

## 2013-02-04 MED ORDER — DARBEPOETIN ALFA-POLYSORBATE 200 MCG/0.4ML IJ SOLN
INTRAMUSCULAR | Status: AC
  Filled 2013-02-04: qty 0.4

## 2013-02-18 ENCOUNTER — Encounter (HOSPITAL_COMMUNITY)
Admission: RE | Admit: 2013-02-18 | Discharge: 2013-02-18 | Disposition: A | Discharge status: Home / Self Care | Payer: Medicare Other | Source: Ambulatory Visit | Attending: Nephrology | Admitting: Nephrology

## 2013-02-18 DIAGNOSIS — N184 Chronic kidney disease, stage 4 (severe): Secondary | ICD-10-CM | POA: Insufficient documentation

## 2013-02-18 DIAGNOSIS — D638 Anemia in other chronic diseases classified elsewhere: Secondary | ICD-10-CM | POA: Insufficient documentation

## 2013-02-18 MED ORDER — DARBEPOETIN ALFA-POLYSORBATE 200 MCG/0.4ML IJ SOLN
INTRAMUSCULAR | Status: AC
  Administered 2013-02-18: 200 ug via SUBCUTANEOUS
  Filled 2013-02-18: qty 0.4

## 2013-02-18 MED ORDER — DARBEPOETIN ALFA-POLYSORBATE 200 MCG/0.4ML IJ SOLN
200.0000 ug | INTRAMUSCULAR | Status: DC
  Administered 2013-02-18: 200 ug via SUBCUTANEOUS

## 2013-03-03 ENCOUNTER — Other Ambulatory Visit (HOSPITAL_COMMUNITY): Payer: Self-pay | Admitting: *Deleted

## 2013-03-04 ENCOUNTER — Encounter (HOSPITAL_COMMUNITY): Payer: Medicare Other

## 2013-03-04 ENCOUNTER — Encounter (HOSPITAL_COMMUNITY)
Admission: RE | Admit: 2013-03-04 | Discharge: 2013-03-04 | Disposition: A | Discharge status: Home / Self Care | Payer: Medicare Other | Source: Ambulatory Visit | Attending: Nephrology | Admitting: Nephrology

## 2013-03-04 LAB — POCT HEMOGLOBIN-HEMACUE: Hemoglobin: 10.7 g/dL — ABNORMAL LOW (ref 13.0–17.0)

## 2013-03-04 MED ORDER — DARBEPOETIN ALFA-POLYSORBATE 200 MCG/0.4ML IJ SOLN
200.0000 ug | INTRAMUSCULAR | Status: DC
  Administered 2013-03-04: 200 ug via SUBCUTANEOUS

## 2013-03-04 MED ORDER — DARBEPOETIN ALFA-POLYSORBATE 200 MCG/0.4ML IJ SOLN
INTRAMUSCULAR | Status: AC
  Filled 2013-03-04: qty 0.4

## 2013-03-18 ENCOUNTER — Encounter (HOSPITAL_COMMUNITY)
Admission: RE | Admit: 2013-03-18 | Discharge: 2013-03-18 | Disposition: A | Discharge status: Home / Self Care | Payer: Medicare Other | Source: Ambulatory Visit | Attending: Nephrology | Admitting: Nephrology

## 2013-03-18 DIAGNOSIS — N184 Chronic kidney disease, stage 4 (severe): Secondary | ICD-10-CM | POA: Insufficient documentation

## 2013-03-18 DIAGNOSIS — D638 Anemia in other chronic diseases classified elsewhere: Secondary | ICD-10-CM | POA: Insufficient documentation

## 2013-03-18 LAB — IRON AND TIBC
Iron: 51 ug/dL (ref 42–135)
TIBC: 200 ug/dL — ABNORMAL LOW (ref 215–435)

## 2013-03-18 LAB — FERRITIN: Ferritin: 731 ng/mL — ABNORMAL HIGH (ref 22–322)

## 2013-03-18 MED ORDER — DARBEPOETIN ALFA-POLYSORBATE 200 MCG/0.4ML IJ SOLN: 200.0000 ug | INTRAMUSCULAR | Status: DC

## 2013-04-01 ENCOUNTER — Encounter (HOSPITAL_COMMUNITY)
Admission: RE | Admit: 2013-04-01 | Discharge: 2013-04-01 | Disposition: A | Discharge status: Home / Self Care | Payer: Medicare Other | Source: Ambulatory Visit | Attending: Nephrology | Admitting: Nephrology

## 2013-04-01 MED ORDER — DARBEPOETIN ALFA-POLYSORBATE 200 MCG/0.4ML IJ SOLN: 200.0000 ug | INTRAMUSCULAR | Status: DC

## 2013-04-01 MED ORDER — DARBEPOETIN ALFA-POLYSORBATE 200 MCG/0.4ML IJ SOLN
INTRAMUSCULAR | Status: AC
  Administered 2013-04-01: 200 ug via SUBCUTANEOUS
  Filled 2013-04-01: qty 0.4

## 2013-04-14 ENCOUNTER — Other Ambulatory Visit (HOSPITAL_COMMUNITY): Payer: Self-pay | Admitting: *Deleted

## 2013-04-15 ENCOUNTER — Encounter (HOSPITAL_COMMUNITY)
Admission: RE | Admit: 2013-04-15 | Discharge: 2013-04-15 | Disposition: A | Discharge status: Home / Self Care | Payer: Medicare Other | Source: Ambulatory Visit | Attending: Nephrology | Admitting: Nephrology

## 2013-04-15 DIAGNOSIS — D638 Anemia in other chronic diseases classified elsewhere: Secondary | ICD-10-CM | POA: Insufficient documentation

## 2013-04-15 DIAGNOSIS — N184 Chronic kidney disease, stage 4 (severe): Secondary | ICD-10-CM | POA: Insufficient documentation

## 2013-04-15 MED ORDER — DARBEPOETIN ALFA-POLYSORBATE 200 MCG/0.4ML IJ SOLN
200.0000 ug | INTRAMUSCULAR | Status: DC
  Administered 2013-04-15: 200 ug via SUBCUTANEOUS

## 2013-04-15 MED ORDER — DARBEPOETIN ALFA-POLYSORBATE 200 MCG/0.4ML IJ SOLN
INTRAMUSCULAR | Status: AC
  Filled 2013-04-15: qty 0.4

## 2013-04-29 ENCOUNTER — Encounter (HOSPITAL_COMMUNITY)
Admission: RE | Admit: 2013-04-29 | Discharge: 2013-04-29 | Disposition: A | Discharge status: Home / Self Care | Payer: Medicare Other | Source: Ambulatory Visit | Attending: Nephrology | Admitting: Nephrology

## 2013-04-29 MED ORDER — DARBEPOETIN ALFA-POLYSORBATE 200 MCG/0.4ML IJ SOLN
INTRAMUSCULAR | Status: AC
  Filled 2013-04-29: qty 0.4

## 2013-04-29 MED ORDER — DARBEPOETIN ALFA-POLYSORBATE 200 MCG/0.4ML IJ SOLN
200.0000 ug | INTRAMUSCULAR | Status: DC
  Administered 2013-04-29: 200 ug via SUBCUTANEOUS

## 2013-05-08 ENCOUNTER — Other Ambulatory Visit: Payer: Self-pay | Admitting: Nephrology

## 2013-05-08 ENCOUNTER — Ambulatory Visit
Admission: RE | Admit: 2013-05-08 | Discharge: 2013-05-08 | Disposition: A | Discharge status: Home / Self Care | Payer: Medicare Other | Source: Ambulatory Visit | Attending: Nephrology | Admitting: Nephrology

## 2013-05-08 DIAGNOSIS — R0602 Shortness of breath: Secondary | ICD-10-CM

## 2013-05-13 ENCOUNTER — Encounter (HOSPITAL_COMMUNITY)
Admission: RE | Admit: 2013-05-13 | Discharge: 2013-05-13 | Disposition: A | Discharge status: Home / Self Care | Payer: Medicare Other | Source: Ambulatory Visit | Attending: Nephrology | Admitting: Nephrology

## 2013-05-13 DIAGNOSIS — D638 Anemia in other chronic diseases classified elsewhere: Secondary | ICD-10-CM | POA: Insufficient documentation

## 2013-05-13 DIAGNOSIS — N184 Chronic kidney disease, stage 4 (severe): Secondary | ICD-10-CM | POA: Insufficient documentation

## 2013-05-13 MED ORDER — DARBEPOETIN ALFA-POLYSORBATE 200 MCG/0.4ML IJ SOLN
200.0000 ug | INTRAMUSCULAR | Status: DC
  Administered 2013-05-13: 200 ug via SUBCUTANEOUS

## 2013-05-13 MED ORDER — DARBEPOETIN ALFA-POLYSORBATE 200 MCG/0.4ML IJ SOLN
INTRAMUSCULAR | Status: AC
  Filled 2013-05-13: qty 0.4

## 2013-05-27 ENCOUNTER — Encounter (HOSPITAL_COMMUNITY)
Admission: RE | Admit: 2013-05-27 | Discharge: 2013-05-27 | Disposition: A | Discharge status: Home / Self Care | Payer: Medicare Other | Source: Ambulatory Visit | Attending: Nephrology | Admitting: Nephrology

## 2013-05-27 MED ORDER — DARBEPOETIN ALFA-POLYSORBATE 200 MCG/0.4ML IJ SOLN
200.0000 ug | INTRAMUSCULAR | Status: DC
  Administered 2013-05-27: 200 ug via SUBCUTANEOUS

## 2013-05-27 MED ORDER — DARBEPOETIN ALFA-POLYSORBATE 200 MCG/0.4ML IJ SOLN
INTRAMUSCULAR | Status: AC
  Filled 2013-05-27: qty 0.4

## 2013-06-10 ENCOUNTER — Encounter (HOSPITAL_COMMUNITY)
Admission: RE | Admit: 2013-06-10 | Discharge: 2013-06-10 | Disposition: A | Discharge status: Home / Self Care | Payer: Medicare Other | Source: Ambulatory Visit | Attending: Nephrology | Admitting: Nephrology

## 2013-06-10 DIAGNOSIS — N184 Chronic kidney disease, stage 4 (severe): Secondary | ICD-10-CM | POA: Insufficient documentation

## 2013-06-10 DIAGNOSIS — D638 Anemia in other chronic diseases classified elsewhere: Secondary | ICD-10-CM | POA: Insufficient documentation

## 2013-06-10 LAB — IRON AND TIBC: UIBC: 115 ug/dL — ABNORMAL LOW (ref 125–400)

## 2013-06-10 LAB — FERRITIN: Ferritin: 666 ng/mL — ABNORMAL HIGH (ref 22–322)

## 2013-06-10 MED ORDER — DARBEPOETIN ALFA-POLYSORBATE 200 MCG/0.4ML IJ SOLN: 200.0000 ug | INTRAMUSCULAR | Status: DC

## 2013-06-10 MED ORDER — DARBEPOETIN ALFA-POLYSORBATE 200 MCG/0.4ML IJ SOLN
INTRAMUSCULAR | Status: AC
  Administered 2013-06-10: 200 ug via SUBCUTANEOUS
  Filled 2013-06-10: qty 0.4

## 2013-06-24 ENCOUNTER — Encounter (HOSPITAL_COMMUNITY)
Admission: RE | Admit: 2013-06-24 | Discharge: 2013-06-24 | Disposition: A | Discharge status: Home / Self Care | Payer: Medicare Other | Source: Ambulatory Visit | Attending: Nephrology | Admitting: Nephrology

## 2013-06-24 LAB — POCT HEMOGLOBIN-HEMACUE: Hemoglobin: 9.3 g/dL — ABNORMAL LOW (ref 13.0–17.0)

## 2013-06-24 MED ORDER — DARBEPOETIN ALFA-POLYSORBATE 200 MCG/0.4ML IJ SOLN: 200.0000 ug | INTRAMUSCULAR | Status: DC

## 2013-06-24 MED ORDER — DARBEPOETIN ALFA-POLYSORBATE 200 MCG/0.4ML IJ SOLN
INTRAMUSCULAR | Status: AC
  Administered 2013-06-24: 200 ug via SUBCUTANEOUS
  Filled 2013-06-24: qty 0.4

## 2013-07-07 ENCOUNTER — Other Ambulatory Visit (HOSPITAL_COMMUNITY): Payer: Self-pay | Admitting: *Deleted

## 2013-07-08 ENCOUNTER — Encounter (HOSPITAL_COMMUNITY)
Admission: RE | Admit: 2013-07-08 | Discharge: 2013-07-08 | Disposition: A | Discharge status: Home / Self Care | Payer: Medicare Other | Source: Ambulatory Visit | Attending: Nephrology | Admitting: Nephrology

## 2013-07-08 LAB — POCT HEMOGLOBIN-HEMACUE: Hemoglobin: 9.7 g/dL — ABNORMAL LOW (ref 13.0–17.0)

## 2013-07-08 MED ORDER — DARBEPOETIN ALFA-POLYSORBATE 200 MCG/0.4ML IJ SOLN
INTRAMUSCULAR | Status: AC
  Administered 2013-07-08: 200 ug via SUBCUTANEOUS
  Filled 2013-07-08: qty 0.4

## 2013-07-08 MED ORDER — DARBEPOETIN ALFA-POLYSORBATE 200 MCG/0.4ML IJ SOLN: 200.0000 ug | INTRAMUSCULAR | Status: DC

## 2013-07-22 ENCOUNTER — Encounter (HOSPITAL_COMMUNITY)
Admission: RE | Admit: 2013-07-22 | Discharge: 2013-07-22 | Disposition: A | Discharge status: Home / Self Care | Payer: Medicare Other | Source: Ambulatory Visit | Attending: Nephrology | Admitting: Nephrology

## 2013-07-22 DIAGNOSIS — N184 Chronic kidney disease, stage 4 (severe): Secondary | ICD-10-CM | POA: Insufficient documentation

## 2013-07-22 DIAGNOSIS — D638 Anemia in other chronic diseases classified elsewhere: Secondary | ICD-10-CM | POA: Insufficient documentation

## 2013-07-22 MED ORDER — DARBEPOETIN ALFA-POLYSORBATE 200 MCG/0.4ML IJ SOLN
INTRAMUSCULAR | Status: AC
  Administered 2013-07-22: 200 ug via SUBCUTANEOUS
  Filled 2013-07-22: qty 0.4

## 2013-07-22 MED ORDER — DARBEPOETIN ALFA-POLYSORBATE 200 MCG/0.4ML IJ SOLN: 200.0000 ug | INTRAMUSCULAR | Status: DC

## 2013-08-05 ENCOUNTER — Encounter (HOSPITAL_COMMUNITY)
Admission: RE | Admit: 2013-08-05 | Discharge: 2013-08-05 | Disposition: A | Discharge status: Home / Self Care | Payer: Medicare Other | Source: Ambulatory Visit | Attending: Nephrology | Admitting: Nephrology

## 2013-08-05 MED ORDER — DARBEPOETIN ALFA-POLYSORBATE 200 MCG/0.4ML IJ SOLN
INTRAMUSCULAR | Status: AC
  Filled 2013-08-05: qty 0.4

## 2013-08-05 MED ORDER — DARBEPOETIN ALFA-POLYSORBATE 200 MCG/0.4ML IJ SOLN
200.0000 ug | INTRAMUSCULAR | Status: DC
  Administered 2013-08-05: 200 ug via SUBCUTANEOUS

## 2013-08-19 ENCOUNTER — Encounter (HOSPITAL_COMMUNITY)
Admission: RE | Admit: 2013-08-19 | Discharge: 2013-08-19 | Disposition: A | Discharge status: Home / Self Care | Payer: Medicare Other | Source: Ambulatory Visit | Attending: Nephrology | Admitting: Nephrology

## 2013-08-19 DIAGNOSIS — D638 Anemia in other chronic diseases classified elsewhere: Secondary | ICD-10-CM | POA: Insufficient documentation

## 2013-08-19 DIAGNOSIS — N184 Chronic kidney disease, stage 4 (severe): Secondary | ICD-10-CM | POA: Insufficient documentation

## 2013-08-19 MED ORDER — DARBEPOETIN ALFA-POLYSORBATE 200 MCG/0.4ML IJ SOLN
200.0000 ug | INTRAMUSCULAR | Status: DC
  Administered 2013-08-19: 200 ug via SUBCUTANEOUS
  Filled 2013-08-19: qty 0.4

## 2013-09-02 ENCOUNTER — Encounter (HOSPITAL_COMMUNITY)
Admission: RE | Admit: 2013-09-02 | Discharge: 2013-09-02 | Disposition: A | Discharge status: Home / Self Care | Payer: Medicare Other | Source: Ambulatory Visit | Attending: Nephrology | Admitting: Nephrology

## 2013-09-02 LAB — POCT HEMOGLOBIN-HEMACUE: Hemoglobin: 8.9 g/dL — ABNORMAL LOW (ref 13.0–17.0)

## 2013-09-02 MED ORDER — DARBEPOETIN ALFA-POLYSORBATE 200 MCG/0.4ML IJ SOLN
INTRAMUSCULAR | Status: AC
  Administered 2013-09-02: 200 ug via SUBCUTANEOUS
  Filled 2013-09-02: qty 0.4

## 2013-09-02 MED ORDER — DARBEPOETIN ALFA-POLYSORBATE 200 MCG/0.4ML IJ SOLN
200.0000 ug | INTRAMUSCULAR | Status: DC
  Administered 2013-09-02: 200 ug via SUBCUTANEOUS

## 2013-09-10 HISTORY — PX: PERICARDIOCENTESIS: SHX2215

## 2013-09-16 ENCOUNTER — Encounter (HOSPITAL_COMMUNITY): Payer: Medicare Other

## 2013-09-17 ENCOUNTER — Ambulatory Visit (HOSPITAL_COMMUNITY)
Admission: RE | Admit: 2013-09-17 | Discharge: 2013-09-17 | Disposition: A | Discharge status: Home / Self Care | Payer: Medicare Other | Source: Ambulatory Visit | Attending: Emergency Medicine | Admitting: Emergency Medicine

## 2013-09-17 ENCOUNTER — Inpatient Hospital Stay (HOSPITAL_COMMUNITY)
Admission: EM | Admit: 2013-09-17 | Discharge: 2013-09-26 | DRG: 291 | Disposition: A | Discharge status: Home / Self Care | Payer: Medicare Other | Attending: Internal Medicine | Admitting: Internal Medicine

## 2013-09-17 ENCOUNTER — Encounter (HOSPITAL_COMMUNITY): Payer: Self-pay | Admitting: Emergency Medicine

## 2013-09-17 ENCOUNTER — Other Ambulatory Visit (HOSPITAL_COMMUNITY): Payer: Self-pay | Admitting: Emergency Medicine

## 2013-09-17 ENCOUNTER — Other Ambulatory Visit: Payer: Self-pay

## 2013-09-17 DIAGNOSIS — H3552 Pigmentary retinal dystrophy: Secondary | ICD-10-CM | POA: Diagnosis present

## 2013-09-17 DIAGNOSIS — R0602 Shortness of breath: Secondary | ICD-10-CM

## 2013-09-17 DIAGNOSIS — J810 Acute pulmonary edema: Secondary | ICD-10-CM | POA: Diagnosis present

## 2013-09-17 DIAGNOSIS — I313 Pericardial effusion (noninflammatory): Secondary | ICD-10-CM | POA: Diagnosis present

## 2013-09-17 DIAGNOSIS — I110 Hypertensive heart disease with heart failure: Secondary | ICD-10-CM

## 2013-09-17 DIAGNOSIS — N19 Unspecified kidney failure: Secondary | ICD-10-CM

## 2013-09-17 DIAGNOSIS — I959 Hypotension, unspecified: Secondary | ICD-10-CM | POA: Diagnosis not present

## 2013-09-17 DIAGNOSIS — H548 Legal blindness, as defined in USA: Secondary | ICD-10-CM | POA: Diagnosis present

## 2013-09-17 DIAGNOSIS — I319 Disease of pericardium, unspecified: Secondary | ICD-10-CM

## 2013-09-17 DIAGNOSIS — R062 Wheezing: Secondary | ICD-10-CM

## 2013-09-17 DIAGNOSIS — Z7982 Long term (current) use of aspirin: Secondary | ICD-10-CM

## 2013-09-17 DIAGNOSIS — I3139 Other pericardial effusion (noninflammatory): Secondary | ICD-10-CM | POA: Diagnosis present

## 2013-09-17 DIAGNOSIS — J9600 Acute respiratory failure, unspecified whether with hypoxia or hypercapnia: Secondary | ICD-10-CM | POA: Diagnosis present

## 2013-09-17 DIAGNOSIS — N2581 Secondary hyperparathyroidism of renal origin: Secondary | ICD-10-CM | POA: Diagnosis present

## 2013-09-17 DIAGNOSIS — S68118A Complete traumatic metacarpophalangeal amputation of other finger, initial encounter: Secondary | ICD-10-CM

## 2013-09-17 DIAGNOSIS — J440 Chronic obstructive pulmonary disease with acute lower respiratory infection: Secondary | ICD-10-CM | POA: Diagnosis present

## 2013-09-17 DIAGNOSIS — N184 Chronic kidney disease, stage 4 (severe): Secondary | ICD-10-CM | POA: Diagnosis present

## 2013-09-17 DIAGNOSIS — Z886 Allergy status to analgesic agent status: Secondary | ICD-10-CM

## 2013-09-17 DIAGNOSIS — Z23 Encounter for immunization: Secondary | ICD-10-CM

## 2013-09-17 DIAGNOSIS — Z860100 Personal history of colon polyps, unspecified: Secondary | ICD-10-CM

## 2013-09-17 DIAGNOSIS — Z8041 Family history of malignant neoplasm of ovary: Secondary | ICD-10-CM

## 2013-09-17 DIAGNOSIS — J9601 Acute respiratory failure with hypoxia: Secondary | ICD-10-CM | POA: Diagnosis present

## 2013-09-17 DIAGNOSIS — N186 End stage renal disease: Secondary | ICD-10-CM | POA: Diagnosis present

## 2013-09-17 DIAGNOSIS — M109 Gout, unspecified: Secondary | ICD-10-CM | POA: Diagnosis present

## 2013-09-17 DIAGNOSIS — D631 Anemia in chronic kidney disease: Secondary | ICD-10-CM | POA: Diagnosis present

## 2013-09-17 DIAGNOSIS — F17200 Nicotine dependence, unspecified, uncomplicated: Secondary | ICD-10-CM | POA: Diagnosis present

## 2013-09-17 DIAGNOSIS — D649 Anemia, unspecified: Secondary | ICD-10-CM

## 2013-09-17 DIAGNOSIS — Z79899 Other long term (current) drug therapy: Secondary | ICD-10-CM

## 2013-09-17 DIAGNOSIS — E872 Acidosis, unspecified: Secondary | ICD-10-CM | POA: Diagnosis present

## 2013-09-17 DIAGNOSIS — J209 Acute bronchitis, unspecified: Secondary | ICD-10-CM | POA: Diagnosis present

## 2013-09-17 DIAGNOSIS — I5033 Acute on chronic diastolic (congestive) heart failure: Principal | ICD-10-CM | POA: Diagnosis present

## 2013-09-17 DIAGNOSIS — D696 Thrombocytopenia, unspecified: Secondary | ICD-10-CM | POA: Diagnosis present

## 2013-09-17 DIAGNOSIS — N400 Enlarged prostate without lower urinary tract symptoms: Secondary | ICD-10-CM | POA: Diagnosis present

## 2013-09-17 DIAGNOSIS — N179 Acute kidney failure, unspecified: Secondary | ICD-10-CM | POA: Diagnosis not present

## 2013-09-17 DIAGNOSIS — I509 Heart failure, unspecified: Secondary | ICD-10-CM

## 2013-09-17 DIAGNOSIS — M1990 Unspecified osteoarthritis, unspecified site: Secondary | ICD-10-CM

## 2013-09-17 DIAGNOSIS — E46 Unspecified protein-calorie malnutrition: Secondary | ICD-10-CM | POA: Diagnosis present

## 2013-09-17 DIAGNOSIS — I120 Hypertensive chronic kidney disease with stage 5 chronic kidney disease or end stage renal disease: Secondary | ICD-10-CM | POA: Diagnosis not present

## 2013-09-17 DIAGNOSIS — I10 Essential (primary) hypertension: Secondary | ICD-10-CM | POA: Diagnosis present

## 2013-09-17 HISTORY — DX: Chronic kidney disease, stage 4 (severe): N18.4

## 2013-09-17 HISTORY — DX: Pericardial effusion (noninflammatory): I31.3

## 2013-09-17 HISTORY — DX: Gangrene, not elsewhere classified: I96

## 2013-09-17 HISTORY — DX: Polyp of colon: K63.5

## 2013-09-17 HISTORY — DX: Other pericardial effusion (noninflammatory): I31.39

## 2013-09-17 LAB — POCT I-STAT 3, ART BLOOD GAS (G3+)
Acid-base deficit: 10 mmol/L — ABNORMAL HIGH (ref 0.0–2.0)
Bicarbonate: 15.7 mEq/L — ABNORMAL LOW (ref 20.0–24.0)
Patient temperature: 98.6
TCO2: 17 mmol/L (ref 0–100)
pH, Arterial: 7.307 — ABNORMAL LOW (ref 7.350–7.450)

## 2013-09-17 LAB — CBC WITH DIFFERENTIAL/PLATELET
Basophils Absolute: 0 10*3/uL (ref 0.0–0.1)
Basophils Relative: 0 % (ref 0–1)
Eosinophils Absolute: 0 10*3/uL (ref 0.0–0.7)
Eosinophils Absolute: 0 10*3/uL (ref 0.0–0.7)
Eosinophils Relative: 0 % (ref 0–5)
Lymphocytes Relative: 9 % — ABNORMAL LOW (ref 12–46)
Lymphs Abs: 0.5 10*3/uL — ABNORMAL LOW (ref 0.7–4.0)
MCH: 30 pg (ref 26.0–34.0)
MCHC: 32.2 g/dL (ref 30.0–36.0)
Monocytes Relative: 1 % — ABNORMAL LOW (ref 3–12)
Neutro Abs: 4.9 10*3/uL (ref 1.7–7.7)
Neutro Abs: 3.9 10*3/uL (ref 1.7–7.7)
Neutrophils Relative %: 86 % — ABNORMAL HIGH (ref 43–77)
Neutrophils Relative %: 90 % — ABNORMAL HIGH (ref 43–77)
Platelets: 64 10*3/uL — ABNORMAL LOW (ref 150–400)
Platelets: 56 10*3/uL — ABNORMAL LOW (ref 150–400)
RBC: 3.1 MIL/uL — ABNORMAL LOW (ref 4.22–5.81)
RDW: 16.5 % — ABNORMAL HIGH (ref 11.5–15.5)
WBC: 5.7 10*3/uL (ref 4.0–10.5)

## 2013-09-17 LAB — PRO B NATRIURETIC PEPTIDE
Pro B Natriuretic peptide (BNP): 7483 pg/mL — ABNORMAL HIGH (ref 0–125)
Pro B Natriuretic peptide (BNP): 8483 pg/mL — ABNORMAL HIGH (ref 0–125)

## 2013-09-17 LAB — COMPREHENSIVE METABOLIC PANEL
ALT: 9 U/L (ref 0–53)
AST: 11 U/L (ref 0–37)
BUN: 59 mg/dL — ABNORMAL HIGH (ref 6–23)
CO2: 17 mEq/L — ABNORMAL LOW (ref 19–32)
Calcium: 8.8 mg/dL (ref 8.4–10.5)
Chloride: 111 mEq/L (ref 96–112)
Creatinine, Ser: 3.62 mg/dL — ABNORMAL HIGH (ref 0.50–1.35)
GFR calc non Af Amer: 16 mL/min — ABNORMAL LOW (ref >90)
Total Bilirubin: 0.4 mg/dL (ref 0.3–1.2)

## 2013-09-17 LAB — BASIC METABOLIC PANEL
Calcium: 8.3 mg/dL — ABNORMAL LOW (ref 8.4–10.5)
GFR calc Af Amer: 20 mL/min — ABNORMAL LOW (ref >90)
GFR calc non Af Amer: 17 mL/min — ABNORMAL LOW (ref >90)
Potassium: 4.2 mEq/L (ref 3.5–5.1)
Sodium: 144 mEq/L (ref 135–145)

## 2013-09-17 LAB — TROPONIN I
Troponin I: <0.3 ng/mL (ref <0.30)
Troponin I: <0.3 ng/mL (ref <0.30)
Troponin I: <0.3 ng/mL (ref <0.30)

## 2013-09-17 LAB — MRSA PCR SCREENING: MRSA by PCR: NEGATIVE

## 2013-09-17 LAB — PHOSPHORUS: Phosphorus: 2.9 mg/dL (ref 2.3–4.6)

## 2013-09-17 MED ORDER — ACETAMINOPHEN 650 MG RE SUPP: 650.0000 mg | Freq: Four times a day (QID) | RECTAL | Status: DC | PRN

## 2013-09-17 MED ORDER — FUROSEMIDE 10 MG/ML IJ SOLN
80.0000 mg | Freq: Three times a day (TID) | INTRAMUSCULAR | Status: DC
  Administered 2013-09-17 – 2013-09-18 (×5): 80 mg via INTRAVENOUS
  Filled 2013-09-17 (×7): qty 8

## 2013-09-17 MED ORDER — LEVOFLOXACIN IN D5W 500 MG/100ML IV SOLN
500.0000 mg | INTRAVENOUS | Status: DC
  Filled 2013-09-17 (×2): qty 100

## 2013-09-17 MED ORDER — ASPIRIN EC 81 MG PO TBEC
81.0000 mg | DELAYED_RELEASE_TABLET | Freq: Every day | ORAL | Status: DC
  Administered 2013-09-17 – 2013-09-26 (×9): 81 mg via ORAL
  Filled 2013-09-17 (×10): qty 1

## 2013-09-17 MED ORDER — PNEUMOCOCCAL VAC POLYVALENT 25 MCG/0.5ML IJ INJ
0.5000 mL | INJECTION | INTRAMUSCULAR | Status: AC
  Administered 2013-09-18: 0.5 mL via INTRAMUSCULAR
  Filled 2013-09-17 (×2): qty 0.5

## 2013-09-17 MED ORDER — ONDANSETRON HCL 4 MG PO TABS: 4.0000 mg | ORAL_TABLET | Freq: Four times a day (QID) | ORAL | Status: DC | PRN

## 2013-09-17 MED ORDER — ACETAMINOPHEN 325 MG PO TABS: 650.0000 mg | ORAL_TABLET | Freq: Four times a day (QID) | ORAL | Status: DC | PRN

## 2013-09-17 MED ORDER — SODIUM BICARBONATE 650 MG PO TABS
650.0000 mg | ORAL_TABLET | Freq: Two times a day (BID) | ORAL | Status: DC
  Administered 2013-09-17 – 2013-09-18 (×4): 650 mg via ORAL
  Filled 2013-09-17 (×6): qty 1

## 2013-09-17 MED ORDER — CARVEDILOL 25 MG PO TABS
25.0000 mg | ORAL_TABLET | Freq: Two times a day (BID) | ORAL | Status: DC
  Administered 2013-09-17 – 2013-09-19 (×5): 25 mg via ORAL
  Filled 2013-09-17 (×10): qty 1

## 2013-09-17 MED ORDER — IPRATROPIUM BROMIDE 0.02 % IN SOLN
0.5000 mg | RESPIRATORY_TRACT | Status: DC
  Administered 2013-09-17 – 2013-09-21 (×26): 0.5 mg via RESPIRATORY_TRACT
  Filled 2013-09-17 (×27): qty 2.5

## 2013-09-17 MED ORDER — SODIUM CHLORIDE 0.9 % IJ SOLN
3.0000 mL | Freq: Two times a day (BID) | INTRAMUSCULAR | Status: DC
  Administered 2013-09-17 – 2013-09-26 (×15): 3 mL via INTRAVENOUS

## 2013-09-17 MED ORDER — FUROSEMIDE 10 MG/ML IJ SOLN
40.0000 mg | Freq: Once | INTRAMUSCULAR | Status: AC
  Administered 2013-09-17: 40 mg via INTRAVENOUS
  Filled 2013-09-17: qty 4

## 2013-09-17 MED ORDER — ONDANSETRON HCL 4 MG/2ML IJ SOLN: 4.0000 mg | Freq: Four times a day (QID) | INTRAMUSCULAR | Status: DC | PRN

## 2013-09-17 MED ORDER — ALBUTEROL SULFATE (5 MG/ML) 0.5% IN NEBU
2.5000 mg | INHALATION_SOLUTION | RESPIRATORY_TRACT | Status: DC
  Administered 2013-09-17 – 2013-09-21 (×26): 2.5 mg via RESPIRATORY_TRACT
  Filled 2013-09-17 (×27): qty 0.5

## 2013-09-17 MED ORDER — LEVOFLOXACIN IN D5W 750 MG/150ML IV SOLN
750.0000 mg | Freq: Once | INTRAVENOUS | Status: AC
  Administered 2013-09-17: 750 mg via INTRAVENOUS
  Filled 2013-09-17: qty 150

## 2013-09-17 MED ORDER — ALBUTEROL SULFATE (5 MG/ML) 0.5% IN NEBU: 2.5000 mg | INHALATION_SOLUTION | RESPIRATORY_TRACT | Status: DC | PRN

## 2013-09-17 MED ORDER — INFLUENZA VAC SPLIT QUAD 0.5 ML IM SUSP
0.5000 mL | INTRAMUSCULAR | Status: AC
  Administered 2013-09-18: 0.5 mL via INTRAMUSCULAR
  Filled 2013-09-17 (×2): qty 0.5

## 2013-09-17 MED ORDER — COLCHICINE 0.6 MG PO TABS
0.6000 mg | ORAL_TABLET | Freq: Every day | ORAL | Status: DC | PRN
  Filled 2013-09-17: qty 1

## 2013-09-17 MED ORDER — ALBUTEROL (5 MG/ML) CONTINUOUS INHALATION SOLN: 15.0000 mL | INHALATION_SOLUTION | RESPIRATORY_TRACT | Status: DC

## 2013-09-17 MED ORDER — SODIUM CHLORIDE 0.9 % IJ SOLN
3.0000 mL | Freq: Two times a day (BID) | INTRAMUSCULAR | Status: DC
  Administered 2013-09-17 – 2013-09-18 (×2): 3 mL via INTRAVENOUS

## 2013-09-17 MED ORDER — HYDROCODONE-ACETAMINOPHEN 5-325 MG PO TABS
1.0000 | ORAL_TABLET | Freq: Four times a day (QID) | ORAL | Status: DC | PRN
  Administered 2013-09-20 – 2013-09-24 (×3): 1 via ORAL
  Filled 2013-09-17 (×4): qty 1

## 2013-09-17 MED ORDER — ALBUTEROL SULFATE (5 MG/ML) 0.5% IN NEBU
5.0000 mg | INHALATION_SOLUTION | Freq: Once | RESPIRATORY_TRACT | Status: AC
  Administered 2013-09-17: 5 mg via RESPIRATORY_TRACT

## 2013-09-17 MED ORDER — AMLODIPINE BESYLATE 10 MG PO TABS
10.0000 mg | ORAL_TABLET | Freq: Every day | ORAL | Status: DC
  Administered 2013-09-17 – 2013-09-18 (×2): 10 mg via ORAL
  Filled 2013-09-17 (×2): qty 1

## 2013-09-17 MED ORDER — TAMSULOSIN HCL 0.4 MG PO CAPS
0.4000 mg | ORAL_CAPSULE | Freq: Every day | ORAL | Status: DC
  Administered 2013-09-17 – 2013-09-25 (×9): 0.4 mg via ORAL
  Filled 2013-09-17 (×10): qty 1

## 2013-09-17 MED ORDER — BUDESONIDE 0.25 MG/2ML IN SUSP
0.2500 mg | Freq: Two times a day (BID) | RESPIRATORY_TRACT | Status: DC
  Administered 2013-09-17 – 2013-09-26 (×15): 0.25 mg via RESPIRATORY_TRACT
  Filled 2013-09-17 (×21): qty 2

## 2013-09-17 MED ORDER — ALLOPURINOL 100 MG PO TABS
100.0000 mg | ORAL_TABLET | Freq: Two times a day (BID) | ORAL | Status: DC
  Administered 2013-09-17 – 2013-09-26 (×18): 100 mg via ORAL
  Filled 2013-09-17 (×20): qty 1

## 2013-09-17 MED ORDER — ASPIRIN 81 MG PO CHEW
81.0000 mg | CHEWABLE_TABLET | Freq: Every day | ORAL | Status: DC
  Administered 2013-09-17: 81 mg via ORAL
  Filled 2013-09-17 (×2): qty 1

## 2013-09-17 NOTE — ED Notes (Signed)
See downtime charting. 

## 2013-09-17 NOTE — ED Provider Notes (Signed)
CSN: [id number]     Arrival date & time 09/17/13  0050 History   First MD Initiated Contact with Patient 09/17/13 0258     Chief Complaint - shortness of breath  Patient is a 67 y.o. male presenting with shortness of breath. The history is provided by the patient.  Shortness of Breath Severity:  Moderate Onset quality:  Gradual Duration: several hours  Timing:  Constant Progression:  Worsening Chronicity:  Recurrent Relieved by:  Rest Worsened by:  Activity Associated symptoms: cough   Associated symptoms: no chest pain, no fever and no vomiting     Past Medical History  Diagnosis Date  . Anemia   . Diastolic dysfunction   . Retinitis pigmentosa     Blindness--has shunt --placed yrs ago in [place]..  . Arthritis     Gout  . Hypertension     dx--"long time"  . Shortness of breath   . Chronic kidney disease   . BPH (benign prostatic hyperplasia)   . Blind    Past Surgical History  Procedure Laterality Date  . Eye surgery    . Knee ligament reconstruction      left  . Av fistula placement  07/15/2012    Procedure: ARTERIOVENOUS (AV) FISTULA CREATION;  Surgeon: [name], MD;  Location: [facility] OR;  Service: Vascular;  Laterality: Left;  . Amputation  12/30/2012    Procedure: AMPUTATION DIGIT;  Surgeon: [name], MD;  Location: [facility];  Service: Orthopedics;  Laterality: Left;  LEFT SMALL FINGER AMPUTATION   Family History  Problem Relation Age of Onset  . Cancer Mother     OVARIAN   History  Substance Use Topics  . Smoking status: Current Every Day Smoker -- 0.50 packs/day for 40 years    Types: Cigarettes  . Smokeless tobacco: Never Used  . Alcohol Use: No    Review of Systems  Constitutional: Negative for fever.  Respiratory: Positive for cough and shortness of breath.   Cardiovascular: Negative for chest pain and leg swelling.  Gastrointestinal: Negative for vomiting.  All other systems reviewed and are negative.    Allergies   Ibuprofen  Home Medications   Current Outpatient Rx  Name  Route  Sig  Dispense  Refill  . albuterol (PROVENTIL HFA;VENTOLIN HFA) 108 (90 BASE) MCG/ACT inhaler   Inhalation   Inhale 2 puffs into the lungs every 6 (six) hours as needed.         [name] allopurinol (ZYLOPRIM) 100 MG tablet   Oral   Take 100 mg by mouth 2 (two) times daily.         [name] amLODipine (NORVASC) 10 MG tablet   Oral   Take 10 mg by mouth daily.         [name] aspirin 81 MG tablet   Oral   Take 81 mg by mouth daily.         . carvedilol (COREG) 25 MG tablet   Oral   Take 25 mg by mouth 2 (two) times daily with a meal.         . colchicine 0.6 MG tablet   Oral   Take 0.6 mg by mouth daily as needed. For gout         . furosemide (LASIX) 80 MG tablet   Oral   Take 80 mg by mouth 2 (two) times daily.         [name] HYDROcodone-acetaminophen (NORCO) 5-325 MG per tablet   Oral  Take 1 tablet by mouth every 6 (six) hours as needed for pain.   20 tablet   0   . silodosin (RAPAFLO) 8 MG CAPS capsule   Oral   Take 8 mg by mouth daily with breakfast.         . doxycycline (VIBRA-TABS) 100 MG tablet   Oral   Take 1 tablet (100 mg total) by mouth 2 (two) times daily.   14 tablet   0   .Physical Exam BP 132/62  Pulse 65  Resp 17  SpO2 100%  CONSTITUTIONAL: Well developed/well nourished HEAD: Normocephalic/atraumatic EYES: EOMI/PERRL ENMT: Mucous membranes moist NECK: supple no meningeal signs CV: S1/S2 noted, no murmurs/rubs/gallops noted LUNGS: coarse wheezing noted bilaterally, with mild tachypnea ABDOMEN: soft, nontender, no rebound or guarding GU:no cva tenderness NEURO: Pt is awake/alert, moves all extremitiesx4 EXTREMITIES: pulses normal, full ROM SKIN: warm, color normal PSYCH: no abnormalities of mood noted  ED Course  Procedures   CRITICAL CARE Performed by: Joya Gaskins Total critical care time: 31 Critical care time was exclusive of separately billable procedures  and treating other patients. Critical care was necessary to treat or prevent imminent or life-threatening deterioration. Critical care was time spent personally by me on the following activities: development of treatment plan with patient and/or surrogate as well as nursing, discussions with consultants, evaluation of patient's response to treatment, examination of patient, obtaining history from patient or surrogate, ordering and performing treatments and interventions, ordering and review of laboratory studies, ordering and review of radiographic studies, pulse oximetry and re-evaluation of patient's condition.  3:54 AM Pt presented SOB with wheeze and seemed to respond to albuterol However during ED course his resp status worsened and he required placement of bipap He also received albuterol/atrovent and prednisone He is now improved after placement of bipap CXR reveals CHF.  Lasix was ordered 4:07 AM D/w dr Toniann Fail.  Will admit to stepdown He requests ABG to be performed   MDM  No diagnosis found. Nursing notes including past medical history and social history reviewed and considered in documentation xrays reviewed and considered Labs/vital reviewed and considered    Date: 09/17/2013  Rate: 70  Rhythm: normal sinus rhythm  QRS Axis: normal  Intervals: normal  ST/T Wave abnormalities: nonspecific ST changes  Conduction Disutrbances:none     Joya Gaskins, MD 09/17/13 0408

## 2013-09-17 NOTE — Progress Notes (Signed)
Patient seen and examined.admitted after midnight secondary to increased SOB; presumably due to COPD and CHF exacerbation. Patient denies CP and currently breathing better and no longer on BIPAP. Please referred to Dr. Katherene Ponto H&P for further info and details on admission and plan.  Cameron Mendez [phone]

## 2013-09-17 NOTE — Progress Notes (Signed)
Nutrition Brief Note  Patient identified on the Malnutrition Screening Tool (MST) Report  Wt Readings from Last 15 Encounters:  09/17/13 151 lb 10.8 oz (68.8 kg)  12/25/12 138 lb (62.596 kg)  12/25/12 138 lb (62.596 kg)  08/13/12 138 lb 8 oz (62.823 kg)  08/07/12 145 lb (65.772 kg)  07/10/12 143 lb (64.864 kg)  07/02/12 163 lb (73.936 kg)  04/16/12 163 lb (73.936 kg)  04/02/12 163 lb (73.936 kg)  01/25/12 163 lb (73.936 kg)  10/04/09 173 lb (78.472 kg)  09/28/09 176 lb (79.833 kg)  09/28/09 177 lb (80.287 kg)    Body mass index is 26.02 kg/(m^2). Patient meets criteria for overweight based on current BMI.   Current diet order is Heart Healthy, patient is consuming approximately 100% of meals at this time. Labs and medications reviewed.   RD met with pt who reports he has had a good appetite with good intake.  He reports his wt is stable at 150-152 lbs.  Admission wt is 151 lbs.  Pt denies nutrition-related concerns or additional needs at this time. No nutrition interventions warranted at this time. If nutrition issues arise, please consult RD.   Loyce Dys, MS RD LDN Clinical Inpatient Dietitian Pager: [phone] Weekend/After hours pager: [phone]

## 2013-09-17 NOTE — H&P (Signed)
[facility] History and Physical  BUDDY LOEFFELHOLZ ZOX:[id number] DOB: 1946/04/27 DOA: 09/17/2013  Referring physician: ER physician. PCP: [name], MD  Specialists: Dr. Briant Cedar. Nephrologist.  Chief Complaint: Shortness of breath.  HPI: Cameron Mendez is a 67 y.o. male with known history of chronic kidney disease stage IV, congestive heart failure last EF measured in 2010 was 65-70%, chronic anemia, hypertension started developing sudden onset of shortness of breath last night. Patient did not have any chest pain productive cough fever chills. In the ER he was found to be hypoxic and placed on BiPAP. Patient was given IV Lasix and at this time has been admitted for acute pulmonary edema secondary to CHF. As per patient he is still making urine. Patient states he has been taking his medications as advised. Chest x-ray shows features consistent with CHF. On exam patient does have mild wheezing. Patient's previous 2-D echo done in 2010 did show large pericardial effusion.  Review of Systems: As presented in the history of presenting illness, rest negative.  Past Medical History  Diagnosis Date  . Anemia   . Diastolic dysfunction   . Retinitis pigmentosa     Blindness--has shunt --placed yrs ago in [place]..  . Arthritis     Gout  . Hypertension     dx--"long time"  . Shortness of breath   . Chronic kidney disease   . BPH (benign prostatic hyperplasia)   . Blind    Past Surgical History  Procedure Laterality Date  . Eye surgery    . Knee ligament reconstruction      left  . Av fistula placement  07/15/2012    Procedure: ARTERIOVENOUS (AV) FISTULA CREATION;  Surgeon: [name], MD;  Location: [facility] OR;  Service: Vascular;  Laterality: Left;  . Amputation  12/30/2012    Procedure: AMPUTATION DIGIT;  Surgeon: [name], MD;  Location: [facility];  Service: Orthopedics;  Laterality: Left;  LEFT SMALL FINGER AMPUTATION   Social History:  reports that he has  been smoking Cigarettes.  He has a 20 pack-year smoking history. He has never used smokeless tobacco. He reports that he does not drink alcohol or use illicit drugs. Home. where does patient live-- Yes. Can patient participate in ADLs?  Allergies  Allergen Reactions  . Ibuprofen     Family History  Problem Relation Age of Onset  . Cancer Mother     OVARIAN      Prior to Admission medications   Medication Sig Start Date End Date Taking? Authorizing Provider  albuterol (PROVENTIL HFA;VENTOLIN HFA) 108 (90 BASE) MCG/ACT inhaler Inhale 2 puffs into the lungs every 6 (six) hours as needed.   Yes Historical Provider, MD  allopurinol (ZYLOPRIM) 100 MG tablet Take 100 mg by mouth 2 (two) times daily.   Yes Historical Provider, MD  amLODipine (NORVASC) 10 MG tablet Take 10 mg by mouth daily.   Yes Historical Provider, MD  aspirin 81 MG tablet Take 81 mg by mouth daily.   Yes Historical Provider, MD  carvedilol (COREG) 25 MG tablet Take 25 mg by mouth 2 (two) times daily with a meal.   Yes Historical Provider, MD  colchicine 0.6 MG tablet Take 0.6 mg by mouth daily as needed. For gout   Yes Historical Provider, MD  furosemide (LASIX) 80 MG tablet Take 80 mg by mouth 2 (two) times daily.   Yes Historical Provider, MD  HYDROcodone-acetaminophen (NORCO) 5-325 MG per tablet Take 1 tablet by mouth  every 6 (six) hours as needed for pain. 12/30/12  Yes [name], MD  silodosin (RAPAFLO) 8 MG CAPS capsule Take 8 mg by mouth daily with breakfast.   Yes Historical Provider, MD  doxycycline (VIBRA-TABS) 100 MG tablet Take 1 tablet (100 mg total) by mouth 2 (two) times daily. 12/30/12   [name], MD   Physical Exam: Filed Vitals:   09/17/13 0330 09/17/13 0345 09/17/13 0430 09/17/13 0455  BP: 132/62 138/57 146/65 147/59  Pulse: 65 66 71 67  Temp:    98 F (36.7 C)  TempSrc:    Axillary  Resp: 17 17 19 16   Height:    5\' 4"  (1.626 m)  Weight:    68.8 kg (151 lb 10.8 oz)  SpO2: 100% 100% 100% 97%      General:  Well-developed and nourished.  Eyes: Anicteric no pallor.  ENT: No discharge from ears eyes nose mouth.  Neck: No mass felt. Positive JVD.  Cardiovascular: S1-S2 heard.  Respiratory: Mild expiratory wheeze heard bilaterally.  Abdomen: Soft nontender bowel sounds present.  Skin: No rash.  Musculoskeletal: No edema.  Psychiatric: Appears normal.  Neurologic: Alert awake oriented to time place and person. Moves all extremities.  Labs on Admission:  Basic Metabolic Panel:  Recent Labs Lab 09/17/13 0210  NA 144  K 4.2  CL 113*  CO2 16*  GLUCOSE 136*  BUN 56*  CREATININE 3.45*  CALCIUM 8.3*   Liver Function Tests: No results found for this basename: AST, ALT, ALKPHOS, BILITOT, PROT, ALBUMIN,  in the last 168 hours No results found for this basename: LIPASE, AMYLASE,  in the last 168 hours No results found for this basename: AMMONIA,  in the last 168 hours CBC:  Recent Labs Lab 09/17/13 0210  WBC 5.7  NEUTROABS 4.9  HGB 9.3*  HCT 29.5*  MCV 95.2  PLT 64*   Cardiac Enzymes:  Recent Labs Lab 09/17/13 0425  TROPONINI <0.30    BNP (last 3 results)  Recent Labs  09/17/13 0425  PROBNP 7483.0*   CBG: No results found for this basename: GLUCAP,  in the last 168 hours  Radiological Exams on Admission: No results found.  EKG: Independently reviewed. Normal sinus rhythm with low-voltage.  Assessment/Plan Principal Problem:   Acute respiratory failure Active Problems:   Essential hypertension, benign   ANEMIA, HX OF   Acute pulmonary edema   Chronic kidney disease (CKD), stage IV (severe)   1. Acute respiratory failure probably secondary to acute pulmonary - at this time I have placed patient on Lasix 80 mg IV every week. Check 2-D echo to measure EF and also pericardial effusion. Closely follow intake output and metabolic panel. Continue BiPAP for now. I have ordered an ABG. Patient also has a mild wheezing which could be from his  CHF but since patient also smokes cigarettes I have placed patient on nebulizer and Pulmicort. 2. Chronic kidney disease stage IV to V - patient has AV fistula. At this time closely monitor intake output and metabolic panel. If respiratory status does not improve with IV Lasix and may need to consult nephrologist. 3. Metabolic acidosis - I have ordered an ABG stat. Could be from his renal failure. 4. Chronic anemia - closely follow CBC. 5. History of hypertension - continue amlodipine. 6. History of gout - continue allopurinol.    Code Status: Full code.  Family Communication: Patient's wife and daughter at the bedside.  Disposition Plan: Admit to inpatient.  Mykala Mccready N. [facility] Pager [phone].  If 7PM-7AM, please contact night-coverage [email or web address] Password [email or web address] 09/17/2013, 6:09 AM

## 2013-09-17 NOTE — Progress Notes (Signed)
  Echocardiogram 2D Echocardiogram has been performed.  Morry Veiga FRANCES 09/17/2013, 4:57 PM

## 2013-09-17 NOTE — Progress Notes (Signed)
Utilization Review Completed.  

## 2013-09-18 ENCOUNTER — Encounter (HOSPITAL_COMMUNITY): Payer: Self-pay | Admitting: Physician Assistant

## 2013-09-18 DIAGNOSIS — D649 Anemia, unspecified: Secondary | ICD-10-CM | POA: Diagnosis present

## 2013-09-18 DIAGNOSIS — N186 End stage renal disease: Secondary | ICD-10-CM | POA: Diagnosis present

## 2013-09-18 DIAGNOSIS — D696 Thrombocytopenia, unspecified: Secondary | ICD-10-CM | POA: Diagnosis present

## 2013-09-18 LAB — BASIC METABOLIC PANEL
Calcium: 8.5 mg/dL (ref 8.4–10.5)
Chloride: 110 mEq/L (ref 96–112)
GFR calc non Af Amer: 15 mL/min — ABNORMAL LOW (ref >90)
Glucose, Bld: 127 mg/dL — ABNORMAL HIGH (ref 70–99)
Sodium: 144 mEq/L (ref 135–145)

## 2013-09-18 LAB — URINALYSIS, ROUTINE W REFLEX MICROSCOPIC
Bilirubin Urine: NEGATIVE
Glucose, UA: NEGATIVE mg/dL
Ketones, ur: NEGATIVE mg/dL
Nitrite: NEGATIVE
Protein, ur: NEGATIVE mg/dL
Urobilinogen, UA: 0.2 mg/dL (ref 0.0–1.0)

## 2013-09-18 LAB — CBC
MCH: 30.4 pg (ref 26.0–34.0)
MCHC: 32.6 g/dL (ref 30.0–36.0)
Platelets: 60 10*3/uL — ABNORMAL LOW (ref 150–400)
RBC: 2.76 MIL/uL — ABNORMAL LOW (ref 4.22–5.81)
RDW: 16.3 % — ABNORMAL HIGH (ref 11.5–15.5)
WBC: 7.2 10*3/uL (ref 4.0–10.5)

## 2013-09-18 LAB — URINE MICROSCOPIC-ADD ON

## 2013-09-18 MED ORDER — FUROSEMIDE 10 MG/ML IJ SOLN
100.0000 mg | Freq: Four times a day (QID) | INTRAVENOUS | Status: DC
  Filled 2013-09-18 (×3): qty 10

## 2013-09-18 MED ORDER — CALCITRIOL 0.25 MCG PO CAPS
0.2500 ug | ORAL_CAPSULE | Freq: Every day | ORAL | Status: DC
  Administered 2013-09-18 – 2013-09-22 (×4): 0.25 ug via ORAL
  Filled 2013-09-18 (×6): qty 1

## 2013-09-18 MED ORDER — FUROSEMIDE 10 MG/ML IJ SOLN
100.0000 mg | Freq: Four times a day (QID) | INTRAVENOUS | Status: DC
  Administered 2013-09-18 – 2013-09-19 (×2): 100 mg via INTRAVENOUS
  Filled 2013-09-18 (×4): qty 10

## 2013-09-18 MED ORDER — DARBEPOETIN ALFA-POLYSORBATE 100 MCG/0.5ML IJ SOLN
100.0000 ug | INTRAMUSCULAR | Status: DC
  Administered 2013-09-19: 100 ug via SUBCUTANEOUS
  Filled 2013-09-18 (×2): qty 0.5

## 2013-09-18 MED ORDER — DARBEPOETIN ALFA-POLYSORBATE 100 MCG/0.5ML IJ SOLN: 100.0000 ug | INTRAMUSCULAR | Status: DC

## 2013-09-18 NOTE — Consult Note (Signed)
Cardiology Consultation Note  Patient ID: Cameron Mendez, MRN: [id number], DOB/AGE: 1946/03/05 67 y.o. Admit date: 09/17/2013   Date of Consult: 09/18/2013 Primary Physician: [name], MD Primary Cardiologist: New to [facility] although see note below - references stress test  Chief Complaint: SOB Reason for Consult: pericardial effusion, CHF  HPI: Cameron Mendez is a 67 y/o blind M with history of CKD stage IV (LUEAV fistula in place not yet being used), HTN, diastolic CHF, pericardial effusion seen on echo 2010 who presented to [facility] 09/17/2013 with SOB. He was unaware of any prior heart problems. Per H&P, the patient presented with sudden onset of SOB last night. The patient states he had a "cold in my neck" but cannot explain this further. He reports SOB both with activity and at rest. He denies orthopnea, CP, fever or chills. In the ER he was found to be hypoxic requiring bipap. CXR showed mild-mod CHF. He was started on scheduled IV Lasix, receiving 360mg  thus far since admission. Cr has gone from 3.45 to 3.90 and weight up 9 lbs, I/O's only -830 but hospitalist NP reports that patient has had good UOP today and has been occasionally incontinent (condom cath falling off). pBNP 7-8k, Hgb down to 8-9 (general trend in upper 9-10), platelets 60k. Troponins neg x 4. He was found to have large pericardial effusion by 2D echo with partial collapse of the IVC but no definite evidence of tamponade. He denies any current symptoms but is audibly wheezing. Pulsus paradoxus 6.  Past Medical History  Diagnosis Date  . Anemia   . Diastolic dysfunction   . Retinitis pigmentosa     Blindness--has shunt --placed yrs ago in [place]..  . Arthritis     Gout  . Hypertension     dx--"long time"  . CKD (chronic kidney disease), stage IV     a. L upper extremity AV fistula created 07/2012.  [name] BPH (benign prostatic hyperplasia)   . Blind   . Gangrene of finger     a. 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  . Colon polyps   . Pericardial effusion     a. Noted on echo 10/2009. b. Again seen on echo 09/2013.      Most Recent Cardiac Studies: 2D Echo 09/2013 - Left ventricle: The cavity size was normal. Wall thickness was increased in a pattern of moderate LVH. The estimated ejection fraction was 60%. Wall motion was normal; there were no regional wall motion abnormalities. - Left atrium: The atrium was mildly dilated. - Pulmonary arteries: PA peak pressure: 54mm Hg (S). - Pericardium, extracardiac: A large pericardial effusion was identified circumferential to the heart. - Impressions: There is partial collapse of the IVC. There is no definite evidence of tamponade. Impressions: - There is partial collapse of the IVC. There is no definite evidence of tamponade.  2D echo 10/2009 - Left ventricle: Wall thickness was increased in a pattern of severe LVH. Systolic function was vigorous. The estimated ejection fraction was in the range of 65% to 70%. Doppler parameters are consistent with a reversible restrictive pattern, indicative of decreased left ventricular diastolic compliance and/or increased left atrial pressure (grade 3 diastolic dysfunction). - Mitral valve: Mild regurgitation. - Left atrium: The atrium was moderately dilated. - Pulmonary arteries: PA peak pressure: 60mm Hg (S). - Pericardium, extracardiac: Small to moderate pericardial effusion surrounds heart. Measures 6 to 15 mm. Largest along lateral surface of LV. No evidence of hemodynamic compromise by echo criteria.  A small pericardial effusion was identified.  There is a pre-op assessment done by surgery prior to her AV graft implantation which reports:  "Nuclear non-gated stress test on 09/28/09 showed: "Originally a dobutamine stress study was started.The patient felt poorly [wheezing], and there was significant hypertension. The dobutamine was stopped. Therefore, only rest images are available. The rest images are  normal. There is no obvious scar." It was ordered due to severe HTN with pulmonary edema with underlying CKD. He was diuresed, treated for HTN, and Nephrology consulted. At his post-[facility] Cardiology follow-up appointment, Dr. Clifton  did not recommend a follow-up stress test at that time since he was then asymptomatic. Echo was recommended (see below)."   Surgical History:  Past Surgical History  Procedure Laterality Date  . Eye surgery    . Knee ligament reconstruction      left  . Av fistula placement  07/15/2012    Procedure: ARTERIOVENOUS (AV) FISTULA CREATION;  Surgeon: [name], MD;  Location: [facility] OR;  Service: Vascular;  Laterality: Left;  . Amputation  12/30/2012    Procedure: AMPUTATION DIGIT;  Surgeon: [name], MD;  Location: [facility];  Service: Orthopedics;  Laterality: Left;  LEFT SMALL FINGER AMPUTATION     Home Meds: Prior to Admission medications   Medication Sig Start Date End Date Taking? Authorizing Provider  albuterol (PROVENTIL HFA;VENTOLIN HFA) 108 (90 BASE) MCG/ACT inhaler Inhale 2 puffs into the lungs every 6 (six) hours as needed.   Yes Historical Provider, MD  allopurinol (ZYLOPRIM) 100 MG tablet Take 100 mg by mouth 2 (two) times daily.   Yes Historical Provider, MD  amLODipine (NORVASC) 10 MG tablet Take 10 mg by mouth daily.   Yes Historical Provider, MD  aspirin 81 MG tablet Take 81 mg by mouth daily.   Yes Historical Provider, MD  calcitRIOL (ROCALTROL) 0.25 MCG capsule Take 0.25 mcg by mouth daily.   Yes Historical Provider, MD  carvedilol (COREG) 25 MG tablet Take 25 mg by mouth 2 (two) times daily with a meal.   Yes Historical Provider, MD  colchicine 0.6 MG tablet Take 0.6 mg by mouth daily as needed. For gout   Yes Historical Provider, MD  furosemide (LASIX) 80 MG tablet Take 80 mg by mouth 2 (two) times daily.   Yes Historical Provider, MD  HYDROcodone-acetaminophen (NORCO) 5-325 MG per tablet Take 1 tablet by mouth every 6 (six)  hours as needed for pain. 12/30/12  Yes [name], MD  silodosin (RAPAFLO) 8 MG CAPS capsule Take 8 mg by mouth daily with breakfast.   Yes Historical Provider, MD    Inpatient Medications:  . albuterol  2.5 mg Nebulization Q4H  . allopurinol  100 mg Oral BID  . amLODipine  10 mg Oral Daily  . aspirin  81 mg Oral Daily  . aspirin EC  81 mg Oral Daily  . budesonide (PULMICORT) nebulizer solution  0.25 mg Nebulization BID  . carvedilol  25 mg Oral BID WC  . furosemide  80 mg Intravenous Q8H  . influenza vac split quadrivalent PF  0.5 mL Intramuscular Tomorrow-1000  . ipratropium  0.5 mg Nebulization Q4H  . [START ON 09/19/2013] levofloxacin (LEVAQUIN) IV  500 mg Intravenous Q48H  . pneumococcal 23 valent vaccine  0.5 mL Intramuscular Tomorrow-1000  . sodium bicarbonate  650 mg Oral BID  . sodium chloride  3 mL Intravenous Q12H  . sodium chloride  3 mL Intravenous Q12H  . tamsulosin  0.4  mg Oral QPC supper      Allergies:  Allergies  Allergen Reactions  . Ibuprofen     History   Social History  . Marital Status: Married    Spouse Name: N/A    Number of Children: N/A  . Years of Education: N/A   Occupational History  . Not on file.   Social History Main Topics  . Smoking status: Current Every Day Smoker -- 0.50 packs/day for 40 years    Types: Cigarettes  . Smokeless tobacco: Never Used  . Alcohol Use: No  . Drug Use: No  . Sexual Activity: Not on file   Other Topics Concern  . Not on file   Social History Narrative  . No narrative on file     Family History  Problem Relation Age of Onset  . Cancer Mother     OVARIAN     Review of Systems: General: negative for chills, fever Cardiovascular: negative for chest pain, edema, orthopnea, palpitations, paroxysmal nocturnal dyspnea, shortness of breath or dyspnea on exertion Dermatological: negative for rash Respiratory: negative for cough or wheezing Urologic: negative for hematuria Abdominal: negative for  nausea, vomiting, diarrhea, bright red blood per rectum, melena, or hematemesis Neurologic: negative for visual changes, syncope, or dizziness All other systems reviewed and are otherwise negative except as noted above.  Labs:  Recent Labs  09/17/13 0425 09/17/13 0747 09/17/13 1245 09/17/13 2044  TROPONINI <0.30 <0.30 <0.30 <0.30   Lab Results  Component Value Date   WBC 7.2 09/18/2013   HGB 8.4* 09/18/2013   HCT 25.8* 09/18/2013   MCV 93.5 09/18/2013   PLT 60* 09/18/2013    Recent Labs Lab 09/17/13 0747 09/18/13 0400  NA 142 144  K 4.3 4.5  CL 111 110  CO2 17* 17*  BUN 59* 72*  CREATININE 3.62* 3.90*  CALCIUM 8.8 8.5  PROT 8.4*  --   BILITOT 0.4  --   ALKPHOS 79  --   ALT 9  --   AST 11  --   GLUCOSE 146* 127*   Lab Results  Component Value Date   CHOL 145 11/24/2008   HDL 22* 11/24/2008   LDLCALC 57 11/24/2008   TRIG 330* 11/24/2008   Radiology/Studies:  Dg Chest 2 View 09/17/2013   *RADIOLOGY REPORT*  Clinical Data: Short of breath, respiratory distress  Two-view chest x-ray  Comparison: Prior chest x-ray 05/08/2013  Findings: Stable marked enlargement of the cardiopericardial silhouette.  Interval increase in the pulmonary vascular congestion and bilateral interstitial and airspace opacities with slightly increased confluence in the right base.  Small left pleural effusion. Incompletely imaged ventriculoperitoneal shunt tubing appears unchanged.  IMPRESSION: Mild - moderate CHF.   Original Report Authenticated By: Malachy Moan, M.D.    EKG: NSR 70bpm low voltage precordial leads nonspecific ST-T changes  Physical Exam: Blood pressure 116/63, pulse 76, temperature 97.8 F (36.6 C), temperature source Oral, resp. rate 15, height 5\' 4"  (1.626 m), weight 160 lb 4.4 oz (72.7 kg), SpO2 100.00%. Pulsus paradoxus 6. General: Chronically ill appearing AAM in no acute distress but audibly wheezing. Head: Normocephalic, atraumatic, sclera non-icteric, no xanthomas,  nares are without discharge. Dentition in poor repair. Neck: JVD moderately elevated. Lungs: Diffuse coarse breath sounds with expiratory wheezing. Breathing is unlabored. Heart: RRR with S1 S2. No murmurs, rubs, or gallops appreciated. Abdomen: Soft, non-tender, non-distended with normoactive bowel sounds. No hepatomegaly. No rebound/guarding. No obvious abdominal masses. Msk:  Strength and tone appear normal for age. Extremities:  No clubbing or cyanosis. Tr edema.  Distal pedal pulses are 2+ and equal bilaterally. Neuro: Alert and oriented to place/self, not date. Moves all extremities spontaneously. Psych:  Responds to questions with blunted answers, a flat affect.   Assessment and Plan:   1. Acute hypoxic respiratory failure  2. Acute on chronic diastolic CHF 3. Large pericardial effusion 4. Acute on chronic kidney disease stage IV 5.  HTN 6. Acute on chronic anemia 7. Thrombocytopenia, ?chronic  Pt seen and examined with Dr. Antoine Poche. There is currently no evidence of cardiac tamponade but will need to follow volume status and vitals closely. Plan pericardiocentesis tomorrow to aid in fluid removal. Recommend renal consultation to follow along given bump in Cr with diuresis.  Signed, Dayna Dunn PA-C 09/18/2013, 11:37 AM  History and all data above reviewed.  Patient examined.  I agree with the findings as above.  He presented with dyspnea.  I am not sure how cogent he is in his description of symptoms. However, he does report dyspnea.  He is SOB with mild activity.  No apparent chest pain.  Echo shows a large pericardial effusion.  This is increased in size from 2010.  The patient exam reveals COR:RRR, no rub  ,  Lungs: Decreased breath sounds with upper airway transmitted wheezing  ,  Abd: No rebound no guarding, unable to percuss the liver., Ext no edema.  Neck External JVD to the angle of the jaw.  I suspect the internal JV is also to the angle of the jaw.    .  All available labs,  radiology testing, previous records reviewed. Agree with documented assessment and plan. I personally reviewed the echo images. Tamponade.  It is difficult to know although I believe that there is a significant possiblity that his pericardial effusion is contributing to his dyspnea.  He does have edema on Xray and significant cardiac enlargement (pericardia fluid).  This effusion is getting larger and an etiology is not clear (although it could be related to CKD.)  I think that that therapeutic pericardiocentesis is indicated.  However, I did discuss with him the risk of this which is higher with his lower platelets and other comorbidities.  I will place him on the schedule in the AM for pericardiocentesis.    Fayrene Fearing Ellary Casamento  1:26 PM  09/18/2013

## 2013-09-18 NOTE — Progress Notes (Signed)
Spoke with pt's wife with the patient's permission. Explained pericardiocentesis, rationale for doing so, risks and benefits. She is in agreement with her husband of proceeding with procedure tomorrow. Jaspal Pultz PA-C

## 2013-09-18 NOTE — Consult Note (Signed)
Cameron Mendez is a 67 y/o blind male with a history of stage IV CKD, sCreat 2.79 on 3/20, 3.62 on 7/28.  Renal dz due to nephrosclerosis followed by Dr. Briant Cedar,  s/p LUE AV fistula placed 07/15/12, HTN, diastolic CHF, pericardial effusion seen on echo 2010 who presented to [facility] 09/17/2013 with SOB. The patient presented with 2-3 day history of SOB. He reported SOB both with activity and at rest. He was found to be hypoxic requiring bipap. CXR showed a large cardiac silhouette and mild-mod CHF. He was started on IV Lasix 80mg  q8. Cr has gone from 3.45 to 3.90, weight up 9 lbs, but I/O's only -830 so far.  He was found to have large pericardial effusion by 2D echo with partial collapse of the IVC but no definite evidence of tamponade.  We are asked to follow given worsening sCreat and pericardial effusion.  Patient reports to me that a couple of days PTA he vomited for no reason and has had some nausea but overall had been at his baseline status.  He was last seen at our office on 07/07/13.  Past Medical History  Diagnosis Date  . Anemia   . Diastolic dysfunction   . Retinitis pigmentosa     Blindness--has shunt --placed yrs ago in [place]..  . Arthritis     Gout  . Hypertension     dx--"long time"  . CKD (chronic kidney disease), stage IV     a. L upper extremity AV fistula created 07/2012.  [name] BPH (benign prostatic hyperplasia)   . Blind   . Gangrene of finger     a. 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  . Colon polyps   . Pericardial effusion     a. Noted on echo 10/2009. b. Again seen on echo 09/2013.   Past Surgical History  Procedure Laterality Date  . Eye surgery    . Knee ligament reconstruction      left  . Av fistula placement  07/15/2012    Procedure: ARTERIOVENOUS (AV) FISTULA CREATION;  Surgeon: [name], MD;  Location: [facility] OR;  Service: Vascular;  Laterality: Left;  . Amputation  12/30/2012    Procedure: AMPUTATION DIGIT;  Surgeon: [name], MD;   Location: [facility];  Service: Orthopedics;  Laterality: Left;  LEFT SMALL FINGER AMPUTATION   Social History:  reports that he has been smoking Cigarettes.  He has a 20 pack-year smoking history. He has never used smokeless tobacco. He reports that he does not drink alcohol or use illicit drugs. Allergies:  Allergies  Allergen Reactions  . Ibuprofen    Family History  Problem Relation Age of Onset  . Cancer Mother     OVARIAN    Medications:  Scheduled: . albuterol  2.5 mg Nebulization Q4H  . allopurinol  100 mg Oral BID  . amLODipine  10 mg Oral Daily  . aspirin  81 mg Oral Daily  . aspirin EC  81 mg Oral Daily  . budesonide (PULMICORT) nebulizer solution  0.25 mg Nebulization BID  . carvedilol  25 mg Oral BID WC  . furosemide  80 mg Intravenous Q8H  . ipratropium  0.5 mg Nebulization Q4H  . [START ON 09/19/2013] levofloxacin (LEVAQUIN) IV  500 mg Intravenous Q48H  . sodium bicarbonate  650 mg Oral BID  . sodium chloride  3 mL Intravenous Q12H  . sodium chloride  3 mL Intravenous Q12H  . tamsulosin  0.4 mg Oral  QPC supper   ROS: as per HPI  Blood pressure 114/43, pulse 76, temperature 98.3 F (36.8 C), temperature source Oral, resp. rate 22, height 5\' 4"  (1.626 m), weight 72.7 kg (160 lb 4.4 oz), SpO2 97.00%.  General appearance: cooperative and slowed mentation Head: Normocephalic, without obvious abnormality, atraumatic neck vein on right bulging Eyes: blind with bilateral nystagmus Ears: normal TM's and external ear canals both ears Nose: no discharge Throat: lips, mucosa, and tongue normal; teeth and gums normal Resp: wheezes bilaterally Chest wall: no tenderness Cardio: regular rate and rhythm GI: protuberant Extremities: edema trace AVF lue not mature, finger amp Skin: Skin color, texture, turgor normal. No rashes or lesions Neurologic: Grossly normal  Slightly slow responses, but just woke up Results for orders placed during the hospital  encounter of 09/17/13 (from the past 48 hour(s))  TROPONIN I     Status: None   Collection Time    09/17/13  4:25 AM      Result Value Range   Troponin I <0.30  <0.30 ng/mL   Comment:            Due to the release kinetics of cTnI,     a negative result within the first hours     of the onset of symptoms does not rule out     myocardial infarction with certainty.     If myocardial infarction is still suspected,     repeat the test at appropriate intervals.  PRO B NATRIURETIC PEPTIDE     Status: Abnormal   Collection Time    09/17/13  4:25 AM      Result Value Range   Pro B Natriuretic peptide (BNP) 7483.0 (*) 0 - 125 pg/mL  POCT I-STAT 3, BLOOD GAS (G3+)     Status: Abnormal   Collection Time    09/17/13  4:28 AM      Result Value Range   pH, Arterial 7.307 (*) 7.350 - 7.450   pCO2 arterial 31.4 (*) 35.0 - 45.0 mmHg   pO2, Arterial 83.0  80.0 - 100.0 mmHg   Bicarbonate 15.7 (*) 20.0 - 24.0 mEq/L   TCO2 17  0 - 100 mmol/L   O2 Saturation 95.0     Acid-base deficit 10.0 (*) 0.0 - 2.0 mmol/L   Patient temperature 98.6 F     Collection site RADIAL, ALLEN'S TEST ACCEPTABLE     Drawn by Operator     Sample type ARTERIAL    MRSA PCR SCREENING     Status: None   Collection Time    09/17/13  5:11 AM      Result Value Range   MRSA by PCR NEGATIVE  NEGATIVE   Comment:            The GeneXpert MRSA Assay (FDA     approved for NASAL specimens     only), is one component of a     comprehensive MRSA colonization     surveillance program. It is not     intended to diagnose MRSA     infection nor to guide or     monitor treatment for     MRSA infections.  TROPONIN I     Status: None   Collection Time    09/17/13  7:47 AM      Result Value Range   Troponin I <0.30  <0.30 ng/mL   Comment:            Due to the release kinetics of  cTnI,     a negative result within the first hours     of the onset of symptoms does not rule out     myocardial infarction with certainty.     If  myocardial infarction is still suspected,     repeat the test at appropriate intervals.  PRO B NATRIURETIC PEPTIDE     Status: Abnormal   Collection Time    09/17/13  7:47 AM      Result Value Range   Pro B Natriuretic peptide (BNP) 8483.0 (*) 0 - 125 pg/mL  COMPREHENSIVE METABOLIC PANEL     Status: Abnormal   Collection Time    09/17/13  7:47 AM      Result Value Range   Sodium 142  135 - 145 mEq/L   Potassium 4.3  3.5 - 5.1 mEq/L   Chloride 111  96 - 112 mEq/L   CO2 17 (*) 19 - 32 mEq/L   Glucose, Bld 146 (*) 70 - 99 mg/dL   BUN 59 (*) 6 - 23 mg/dL   Creatinine, Ser 1.61 (*) 0.50 - 1.35 mg/dL   Calcium 8.8  8.4 - 09.6 mg/dL   Total Protein 8.4 (*) 6.0 - 8.3 g/dL   Albumin 3.8  3.5 - 5.2 g/dL   AST 11  0 - 37 U/L   ALT 9  0 - 53 U/L   Alkaline Phosphatase 79  39 - 117 U/L   Total Bilirubin 0.4  0.3 - 1.2 mg/dL   GFR calc non Af Amer 16 (*) >90 mL/min   GFR calc Af Amer 19 (*) >90 mL/min   Comment: (NOTE)     The eGFR has been calculated using the CKD EPI equation.     This calculation has not been validated in all clinical situations.     eGFR's persistently <90 mL/min signify possible Chronic Kidney     Disease.  CBC WITH DIFFERENTIAL     Status: Abnormal   Collection Time    09/17/13  7:47 AM      Result Value Range   WBC 4.3  4.0 - 10.5 K/uL   RBC 3.14 (*) 4.22 - 5.81 MIL/uL   Hemoglobin 9.5 (*) 13.0 - 17.0 g/dL   HCT 04.5 (*) 40.9 - 81.1 %   MCV 93.9  78.0 - 100.0 fL   MCH 30.3  26.0 - 34.0 pg   MCHC 32.2  30.0 - 36.0 g/dL   RDW 91.4 (*) 78.2 - 95.6 %   Platelets 56 (*) 150 - 400 K/uL   Comment: CONSISTENT WITH PREVIOUS RESULT   Neutrophils Relative % 90 (*) 43 - 77 %   Neutro Abs 3.9  1.7 - 7.7 K/uL   Lymphocytes Relative 9 (*) 12 - 46 %   Lymphs Abs 0.4 (*) 0.7 - 4.0 K/uL   Monocytes Relative 1 (*) 3 - 12 %   Monocytes Absolute 0.0 (*) 0.1 - 1.0 K/uL   Eosinophils Relative 0  0 - 5 %   Eosinophils Absolute 0.0  0.0 - 0.7 K/uL   Basophils Relative 0  0 - 1  %   Basophils Absolute 0.0  0.0 - 0.1 K/uL  TSH     Status: None   Collection Time    09/17/13  7:47 AM      Result Value Range   TSH 1.853  0.350 - 4.500 uIU/mL   Comment: Performed at [facility]  PHOSPHORUS     Status: None  Collection Time    09/17/13  7:47 AM      Result Value Range   Phosphorus 2.9  2.3 - 4.6 mg/dL  TROPONIN I     Status: None   Collection Time    09/17/13 12:45 PM      Result Value Range   Troponin I <0.30  <0.30 ng/mL   Comment:            Due to the release kinetics of cTnI,     a negative result within the first hours     of the onset of symptoms does not rule out     myocardial infarction with certainty.     If myocardial infarction is still suspected,     repeat the test at appropriate intervals.  TROPONIN I     Status: None   Collection Time    09/17/13  8:44 PM      Result Value Range   Troponin I <0.30  <0.30 ng/mL   Comment:            Due to the release kinetics of cTnI,     a negative result within the first hours     of the onset of symptoms does not rule out     myocardial infarction with certainty.     If myocardial infarction is still suspected,     repeat the test at appropriate intervals.  BASIC METABOLIC PANEL     Status: Abnormal   Collection Time    09/18/13  4:00 AM      Result Value Range   Sodium 144  135 - 145 mEq/L   Potassium 4.5  3.5 - 5.1 mEq/L   Chloride 110  96 - 112 mEq/L   CO2 17 (*) 19 - 32 mEq/L   Glucose, Bld 127 (*) 70 - 99 mg/dL   BUN 72 (*) 6 - 23 mg/dL   Creatinine, Ser 0.27 (*) 0.50 - 1.35 mg/dL   Calcium 8.5  8.4 - 25.3 mg/dL   GFR calc non Af Amer 15 (*) >90 mL/min   GFR calc Af Amer 17 (*) >90 mL/min   Comment: (NOTE)     The eGFR has been calculated using the CKD EPI equation.     This calculation has not been validated in all clinical situations.     eGFR's persistently <90 mL/min signify possible Chronic Kidney     Disease.  CBC     Status: Abnormal   Collection Time    09/18/13   4:00 AM      Result Value Range   WBC 7.2  4.0 - 10.5 K/uL   RBC 2.76 (*) 4.22 - 5.81 MIL/uL   Hemoglobin 8.4 (*) 13.0 - 17.0 g/dL   HCT 66.4 (*) 40.3 - 47.4 %   MCV 93.5  78.0 - 100.0 fL   MCH 30.4  26.0 - 34.0 pg   MCHC 32.6  30.0 - 36.0 g/dL   RDW 25.9 (*) 56.3 - 87.5 %   Platelets 60 (*) 150 - 400 K/uL   Comment: PLATELET COUNT CONFIRMED BY SMEAR     REPEATED TO VERIFY     CONSISTENT WITH PREVIOUS RESULT   Dg Chest 2 View  09/17/2013   *RADIOLOGY REPORT*  Clinical Data: Short of breath, respiratory distress  Two-view chest x-ray  Comparison: Prior chest x-ray 05/08/2013  Findings: Stable marked enlargement of the cardiopericardial silhouette.  Interval increase in the pulmonary vascular congestion and bilateral interstitial and airspace  opacities with slightly increased confluence in the right base.  Small left pleural effusion. Incompletely imaged ventriculoperitoneal shunt tubing appears unchanged.  IMPRESSION: Mild - moderate CHF.   Original Report Authenticated By: Malachy Moan, M.D.    Assessment:  1 Stage IV CKD with acute exacerbation hemodynamically mediated due to CHF and lower BP; with met. acidosis and ? 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  We will follow closely with you.  Jamille Fisher C 09/18/2013, 2:47 PM

## 2013-09-18 NOTE — Progress Notes (Signed)
[facility] Progress Note [place] TEAM 1 - Stepdown ICU Team   Cameron Mendez ZOX:[id number] DOB: 03-01-1946 DOA: 09/17/2013 PCP: [name], MD  Brief narrative: 67 year old male patient with known chronic kidney disease stage IV as well as diastolic congestive heart failure. He has chronic anemia and chronic thrombocytopenia dating back to 2013. He developed sudden onset of shortness of breath on the night of admission this was not associated with any chest pain or constitutional symptoms. In the ER he was profoundly hypoxic and required BiPAP. He was given IV Lasix. Chest x-ray was consistent with edema and CHF. Despite his poor kidney function patient endorsed he is making his usual amount of urine. Patient also has a known small to moderate pericardial effusion found on echocardiogram in 2010.  Assessment/Plan: Active Problems:   Acute respiratory failure with hypoxia due to:   A) DIASTOLIC HEART FAILURE, ACUTE ON CHRONIC   B) Acute pulmonary edema   C) COPD exacerbation -still wheezing- cardiac vs obstructive airways -continue IV Lasix, Coreg, supportive care and nebs -Cards consulted -cont Levaquin to cover for bronchitis    Essential hypertension, benign -as above -cont Norvasc    Large Pericardial effusion -progressively enlarging over years -now large but without tamponade physiology -Cards consulted- pericardiocentesis tomorrow    CKD (chronic kidney disease) stage 4, GFR 15-29 ml/min -subtle increase in BUN/Scr since admit -will dc Colchicine -Nephrology consulted    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    Anemia/Thrombocytopenia, chronic -stable   BPH -cont Flomax   DVT prophylaxis: SCDs Code Status: Full Family Communication: Patient only Disposition Plan/Expected LOS: Stepdown Isolation: None Nutritional Status: Likely has chronic protein calorie malnutrition related to multiple chronic  illnesses  Consultants: Nephrology Cardiology  Procedures: TTE - Left ventricle: The cavity size was normal. Wall thickness was increased in a pattern of moderate LVH. The estimated ejection fraction was 60%. Wall motion was normal; there were no regional wall motion abnormalities. - Left atrium: The atrium was mildly dilated. - Pulmonary arteries: PA peak pressure: 54mm Hg (S). - Pericardium, extracardiac: A large pericardial effusion was identified circumferential to the heart. - Impressions: There is partial collapse of the IVC. There is no definite evidence of tamponade.   Antibiotics:  Levaquin 10/8 >>>  HPI/Subjective: Patient alert and awake and denies shortness of breath- states breathing has improved since admission. Poor historian.  Objective: Blood pressure 114/43, pulse 76, temperature 98.3 F (36.8 C), temperature source Oral, resp. rate 22, height 5\' 4"  (1.626 m), weight 72.7 kg (160 lb 4.4 oz), SpO2 97.00%.  Intake/Output Summary (Last 24 hours) at 09/18/13 1333 Last data filed at 09/18/13 0300  Gross per 24 hour  Intake      0 ml  Output    800 ml  Net   -800 ml     Exam: General: No acute respiratory distress Lungs: COARSE to auscultation bilaterally with wheezes, 3 L Cardiovascular: Regular rate and rhythm without murmur gallop or rub normal S1 and S2, no peripheral edema or JVD Abdomen: Nontender, nondistended, soft, bowel sounds positive, no rebound, no ascites, no appreciable mass Musculoskeletal: No significant cyanosis, clubbing of bilateral lower extremities Neurological: Alert and oriented x 3 although has difficulty with recollection of current medical history including that he last saw specific physicians, moves all extremities x 4 without focal neurological deficits, CN 2-12 intact  Scheduled Meds:  Scheduled Meds: . albuterol  2.5 mg Nebulization Q4H  . allopurinol  100 mg Oral BID  . amLODipine  10 mg Oral Daily  . aspirin  81 mg Oral  Daily  . aspirin EC  81 mg Oral Daily  . budesonide (PULMICORT) nebulizer solution  0.25 mg Nebulization BID  . carvedilol  25 mg Oral BID WC  . furosemide  80 mg Intravenous Q8H  . influenza vac split quadrivalent PF  0.5 mL Intramuscular Tomorrow-1000  . ipratropium  0.5 mg Nebulization Q4H  . [START ON 09/19/2013] levofloxacin (LEVAQUIN) IV  500 mg Intravenous Q48H  . pneumococcal 23 valent vaccine  0.5 mL Intramuscular Tomorrow-1000  . sodium bicarbonate  650 mg Oral BID  . sodium chloride  3 mL Intravenous Q12H  . sodium chloride  3 mL Intravenous Q12H  . tamsulosin  0.4 mg Oral QPC supper   Continuous Infusions:   **Reviewed in detail by the Attending Physicia  Data Reviewed: Basic Metabolic Panel:  Recent Labs Lab 09/17/13 0210 09/17/13 0747 09/18/13 0400  NA 144 142 144  K 4.2 4.3 4.5  CL 113* 111 110  CO2 16* 17* 17*  GLUCOSE 136* 146* 127*  BUN 56* 59* 72*  CREATININE 3.45* 3.62* 3.90*  CALCIUM 8.3* 8.8 8.5  PHOS  --  2.9  --    Liver Function Tests:  Recent Labs Lab 09/17/13 0747  AST 11  ALT 9  ALKPHOS 79  BILITOT 0.4  PROT 8.4*  ALBUMIN 3.8   No results found for this basename: LIPASE, AMYLASE,  in the last 168 hours No results found for this basename: AMMONIA,  in the last 168 hours CBC:  Recent Labs Lab 09/17/13 0210 09/17/13 0747 09/18/13 0400  WBC 5.7 4.3 7.2  NEUTROABS 4.9 3.9  --   HGB 9.3* 9.5* 8.4*  HCT 29.5* 29.5* 25.8*  MCV 95.2 93.9 93.5  PLT 64* 56* 60*   Cardiac Enzymes:  Recent Labs Lab 09/17/13 0425 09/17/13 0747 09/17/13 1245 09/17/13 2044  TROPONINI <0.30 <0.30 <0.30 <0.30   BNP (last 3 results)  Recent Labs  09/17/13 0425 09/17/13 0747  PROBNP 7483.0* 8483.0*   CBG: No results found for this basename: GLUCAP,  in the last 168 hours  Recent Results (from the past 240 hour(s))  MRSA PCR SCREENING     Status: None   Collection Time    09/17/13  5:11 AM      Result Value Range Status   MRSA by PCR  NEGATIVE  NEGATIVE Final   Comment:            The GeneXpert MRSA Assay (FDA     approved for NASAL specimens     only), is one component of a     comprehensive MRSA colonization     surveillance program. It is not     intended to diagnose MRSA     infection nor to guide or     monitor treatment for     MRSA infections.     Studies:  Recent x-ray studies have been reviewed in detail by the Attending Physician     Junious Silk, ANP [facility] Office  [phone] Pager [phone]  **If unable to reach the above provider after paging please contact the Flow Manager @ [phone]  On-Call/Text Page:      [email or web address]      password [email or web address]  If 7PM-7AM, please contact night-coverage [email or web address] Password [email or web address] 09/18/2013, 1:33 PM   LOS: 1 day   I have examined the patient, reviewed the chart and modified the above note which I agree with.   Felton Buczynski,MD  [phone] 09/18/2013, 3:29 PM

## 2013-09-18 NOTE — Progress Notes (Signed)
Attempt to call report to nurse Junius Roads who is currently at bedside with another patient. Nurse to return call

## 2013-09-19 ENCOUNTER — Encounter (HOSPITAL_COMMUNITY)
Admission: EM | Disposition: A | Discharge status: Home / Self Care | Payer: Self-pay | Source: Home / Self Care | Attending: Internal Medicine

## 2013-09-19 DIAGNOSIS — I319 Disease of pericardium, unspecified: Secondary | ICD-10-CM

## 2013-09-19 DIAGNOSIS — J209 Acute bronchitis, unspecified: Secondary | ICD-10-CM

## 2013-09-19 DIAGNOSIS — I509 Heart failure, unspecified: Secondary | ICD-10-CM

## 2013-09-19 HISTORY — PX: PERICARDIAL TAP: SHX5486

## 2013-09-19 LAB — LACTATE DEHYDROGENASE, PLEURAL OR PERITONEAL FLUID: LD, Fluid: 106 U/L — ABNORMAL HIGH (ref 3–23)

## 2013-09-19 LAB — BODY FLUID CELL COUNT WITH DIFFERENTIAL
Eos, Fluid: 0 %
Lymphs, Fluid: 14 %
Monocyte-Macrophage-Serous Fluid: 24 % — ABNORMAL LOW (ref 50–90)
Neutrophil Count, Fluid: 62 % — ABNORMAL HIGH (ref 0–25)
Total Nucleated Cell Count, Fluid: 149 cu mm (ref 0–1000)

## 2013-09-19 LAB — RENAL FUNCTION PANEL
Albumin: 3.3 g/dL — ABNORMAL LOW (ref 3.5–5.2)
BUN: 90 mg/dL — ABNORMAL HIGH (ref 6–23)
CO2: 19 mEq/L (ref 19–32)
Calcium: 8.5 mg/dL (ref 8.4–10.5)
GFR calc Af Amer: 14 mL/min — ABNORMAL LOW (ref >90)
GFR calc non Af Amer: 12 mL/min — ABNORMAL LOW (ref >90)
Phosphorus: 4.7 mg/dL — ABNORMAL HIGH (ref 2.3–4.6)
Sodium: 142 mEq/L (ref 135–145)

## 2013-09-19 LAB — CREATININE, FLUID (PLEURAL, PERITONEAL, JP DRAINAGE): Creat, Fluid: 4.4 mg/dL

## 2013-09-19 LAB — AMYLASE, BODY FLUID: Amylase, Fluid: 34 U/L

## 2013-09-19 LAB — IRON AND TIBC
Iron: 60 ug/dL (ref 42–135)
Saturation Ratios: 37 % (ref 20–55)
TIBC: 163 ug/dL — ABNORMAL LOW (ref 215–435)
UIBC: 103 ug/dL — ABNORMAL LOW (ref 125–400)

## 2013-09-19 LAB — FERRITIN: Ferritin: 545 ng/mL — ABNORMAL HIGH (ref 22–322)

## 2013-09-19 LAB — PROTEIN, BODY FLUID: Total protein, fluid: 5.1 g/dL

## 2013-09-19 SURGERY — PERICARDIAL TAP
Anesthesia: LOCAL

## 2013-09-19 MED ORDER — SODIUM CHLORIDE 0.9 % IV SOLN
INTRAVENOUS | Status: AC
  Administered 2013-09-19: 16:00:00 via INTRAVENOUS

## 2013-09-19 MED ORDER — HEPARIN (PORCINE) IN NACL 2-0.9 UNIT/ML-% IJ SOLN
INTRAMUSCULAR | Status: AC
  Filled 2013-09-19: qty 500

## 2013-09-19 MED ORDER — COLCHICINE 0.6 MG PO TABS
0.3000 mg | ORAL_TABLET | Freq: Every day | ORAL | Status: DC
  Administered 2013-09-20 – 2013-09-26 (×7): 0.3 mg via ORAL
  Filled 2013-09-19 (×7): qty 0.5

## 2013-09-19 MED ORDER — FENTANYL CITRATE 0.05 MG/ML IJ SOLN
INTRAMUSCULAR | Status: AC
  Filled 2013-09-19: qty 2

## 2013-09-19 MED ORDER — MORPHINE SULFATE 2 MG/ML IJ SOLN
2.0000 mg | INTRAMUSCULAR | Status: DC | PRN
  Administered 2013-09-19 – 2013-09-20 (×6): 2 mg via INTRAVENOUS
  Filled 2013-09-19 (×6): qty 1

## 2013-09-19 MED ORDER — LIDOCAINE HCL (PF) 1 % IJ SOLN
INTRAMUSCULAR | Status: AC
  Filled 2013-09-19: qty 30

## 2013-09-19 MED ORDER — SODIUM BICARBONATE 650 MG PO TABS
1300.0000 mg | ORAL_TABLET | Freq: Two times a day (BID) | ORAL | Status: DC
  Administered 2013-09-19 – 2013-09-20 (×3): 1300 mg via ORAL
  Filled 2013-09-19 (×5): qty 2

## 2013-09-19 MED ORDER — MIDAZOLAM HCL 2 MG/2ML IJ SOLN
INTRAMUSCULAR | Status: AC
  Filled 2013-09-19: qty 2

## 2013-09-19 MED ORDER — COLCHICINE 0.6 MG PO TABS
0.6000 mg | ORAL_TABLET | Freq: Once | ORAL | Status: AC
  Administered 2013-09-19: 0.6 mg via ORAL
  Filled 2013-09-19: qty 1

## 2013-09-19 NOTE — Progress Notes (Signed)
Pt c/o chest pain.  8/10.  BP also trending lower, heart sounds distant, possible rub heard.  Spoke with Dr Excell Seltzer.  Stat 2d echo ordered to eval pericardial effusion.  Per Dr. Excell Seltzer at bedside, 250cc NS given for SBPs in 80's.  After the bolus pt able to have 2 mg morphine.  This helped with the pain.  Also started on Colchicine for pleuritic pain.    Eliane Decree, RN

## 2013-09-19 NOTE — Progress Notes (Signed)
Echocardiogram 2D Echocardiogram has been performed.  Cameron Mendez 09/19/2013, 4:14 PM

## 2013-09-19 NOTE — Interval H&P Note (Signed)
History and Physical Interval Note:  09/19/2013 9:48 AM  Cameron Mendez  has presented today for surgery, with the diagnosis of pericardial eff  The various methods of treatment have been discussed with the patient and family. After consideration of risks, benefits and other options for treatment, the patient has consented to  Procedure(s): PERICARDIAL TAP (N/A) as a surgical intervention .  The patient's history has been reviewed, patient examined, no change in status, stable for surgery.  I have reviewed the patient's chart and labs.  Questions were answered to the patient's satisfaction.  I called the patient's wife this morning and discussed the plan for pericardiocentesis with her. She understands.    Tonny Bollman

## 2013-09-19 NOTE — Progress Notes (Addendum)
CTSP for pleuritic CP and hypotension. BP has been as low as 80's systolic. Pt complaining of chest pain with deep breathing. Since placement this am, 150 cc pericardial fluid drained.   Exam reveals pt in moderate discomfort. BP 106/30, HR 60's. Lungs clear, heart RRR with a friction rub.   Stat echo done and I reviewed this. Shows resolution of his effusion except small - moderate posterior effusion (much smaller than pre-procedure).   Plan - 250 cc saline bolus, narcotic analgesics. Ideally would treat pericardial pain with NSAID's but he is not a candidate because of his kidney disease. Will add colchicine 0.6 mg once then 0.3 mg daily with his renal disease.   Tonny Bollman 09/19/2013 3:49 PM

## 2013-09-19 NOTE — CV Procedure (Signed)
   Cardiac Catheterization Procedure Note  Name: ODEAN FESTER MRN: [id number] DOB: Apr 18, 1946  Procedure: pericardiocentesis  Indication: large pericardial effusion   Procedural details: The subxiphoid area was prepped and draped using normal sterile technique. After careful palpation and fluoroscopic evaluation of landmarks, the left subxiphoid area was anesthetized with 1% lidocaine. A pericardial needle was advanced under fluoroscopic guidance into the pericardial space and a wire was advanced so that it clearly surrounded the heart silhouette. After a dilator was advanced, a side-hole catheter was advanced into the pericardial space and 740cc or straw-colored pericardial fluid was removed and sent to the lab for fluid studies. The patient tolerated the procedure well without immediate complications.   Final Conclusions:  Successful pericardiocentesis with removal of 740 cc straw-colored fluid.   Recommendations: Echo in am. Remove drain in am pending rate of fluid drainage.   Tonny Bollman 09/19/2013, 10:33 AM

## 2013-09-19 NOTE — Progress Notes (Signed)
Admit: 09/17/2013 LOS: 2  39M CKD4 (Followed by Briant Cedar, BL SCr mid 3s) admitted with SOB, likey acute CHF exacerbation, and large pericardial effusion (drained 10/10 ).  Has AoCKD.    Subjective:  periocardiocentesis this AM, straw colored fluid; left pericardial drain in place On 3L [place] 100% SpO2 Breathing much improved Pt and wife don't identify any dietary indiscretions UA without protein or blood.    10/09 0701 - 10/10 0700 In: 225 [P.O.:222; I.V.:3] Out: 1900 [Urine:1900]  Filed Weights   09/17/13 0455 09/18/13 0455 09/19/13 0500  Weight: 68.8 kg (151 lb 10.8 oz) 72.7 kg (160 lb 4.4 oz) 67.4 kg (148 lb 9.4 oz)    Current meds: reviewed including calcitrol 0.25 daily, aranesp (10/10 last dose), levofloxacin, NaHCO3 650 BID (Inc to 1300 BId today) Current Labs: reviewed    Physical Exam:  Blood pressure 112/57, pulse 68, temperature 97.5 F (36.4 C), temperature source Oral, resp. rate 14, height 5\' 4"  (1.626 m), weight 67.4 kg (148 lb 9.4 oz), SpO2 100.00%. NAD, groggy RRR, nl s1s2 PULM: b/l end exp wheezing, occ rhonchorus sounds ABD: s/nt/nd EXT: no LEE currently; LUA AVF +B/T SKIN: pericardial drain bandaged; straw colored fluid in line NERUO: no asterixis. nonfocal ENT: poor dentition EYES: muddy sclera GU: condom cath on.   Assessment/Plan 1. AoCKD: Worsening over past 24h could be related to aggressive diuresis.  Agree with lasix holidy for next 24h.  Does not appar uremic currently and I agree that this chronic effusion is unlikely 2/2 his CKD.  No indication for RRT.   2. HTN/Vol: as above, hold diuretics for 24h.  BP ok.  No LEE.  Breathing markedly improved with SpO2 100% on 3L.  Amlodipine was held yesterday. 3. Pericardial Effusion: drain in place, studies in lab.  ANA ordered.  Per cardiology. 4. 2HPTH: on calcitriol, Ca is 8.5. 5. Anemia: On aranesp.  Follow 6. Wheezing: SMoker.  ? COPD.  On albuterol.  .   7. MEtabolic acidosis:  increase NaHCO3 to 1300 BID 8. L UA of AVF: appears immature, +B/T.   Sabra Heck MD 09/19/2013, 11:30 AM   Recent Labs Lab 09/17/13 0210 09/17/13 0747 09/18/13 0400 09/19/13 0405  NA 144 142 144 142  K 4.2 4.3 4.5 4.2  CL 113* 111 110 107  CO2 16* 17* 17* 19  GLUCOSE 136* 146* 127* 90  BUN 56* 59* 72* 90*  CREATININE 3.45* 3.62* 3.90* 4.47*  CALCIUM 8.3* 8.8 8.5 8.5  PHOS  --  2.9  --  4.7*    Recent Labs Lab 09/17/13 0210 09/17/13 0747 09/18/13 0400  WBC 5.7 4.3 7.2  NEUTROABS 4.9 3.9  --   HGB 9.3* 9.5* 8.4*  HCT 29.5* 29.5* 25.8*  MCV 95.2 93.9 93.5  PLT 64* 56* 60*

## 2013-09-19 NOTE — Care Management Note (Signed)
    Page 1 of 1   09/19/2013     11:48:46 AM   CARE MANAGEMENT NOTE 09/19/2013  Patient:  Cameron Mendez, Cameron Mendez   Account Number:  [id number]  Date Initiated:  09/19/2013  Documentation initiated by:  Junius Creamer  Subjective/Objective Assessment:   adm w pericardial effusion     Action/Plan:   lives w wife, pcp dr Earvin [place]   Anticipated DC Date:     Anticipated DC Plan:        DC Planning Services  CM consult      Choice offered to / List presented to:             Status of service:   Medicare Important Message given?   (If response is "NO", the following Medicare IM given date fields will be blank) Date Medicare IM given:   Date Additional Medicare IM given:    Discharge Disposition:    Per UR Regulation:  Reviewed for med. necessity/level of care/duration of stay  If discussed at Long Length of Stay Meetings, dates discussed:    Comments:

## 2013-09-19 NOTE — H&P (View-Only) (Signed)
    Subjective:  No Chest pain. Dyspnea unchanged.  Objective:  Vital Signs in the last 24 hours: Temp:  [97.6 F (36.4 C)-98.4 F (36.9 C)] 97.9 F (36.6 C) (10/10 0400) Pulse Rate:  [72-76] 76 (10/09 2357) Resp:  [15-22] 18 (10/09 2357) BP: (112-121)/(43-84) 112/57 mmHg (10/09 2357) SpO2:  [96 %-100 %] 100 % (10/10 0314) Weight:  [148 lb 9.4 oz (67.4 kg)] 148 lb 9.4 oz (67.4 kg) (10/10 0500)  Intake/Output from previous day: 10/09 0701 - 10/10 0700 In: 225 [P.O.:222; I.V.:3] Out: 1900 [Urine:1900]  Physical Exam: Pt is alert and oriented, elderly male in NAD HEENT: normal Neck: JVP - moderately elevated Lungs: CTA bilaterally CV: RRR, distant Abd: soft, NT, obese Ext: no C/C/E Skin: warm/dry no rash   Lab Results:  Recent Labs  09/17/13 0747 09/18/13 0400  WBC 4.3 7.2  HGB 9.5* 8.4*  PLT 56* 60*    Recent Labs  09/18/13 0400 09/19/13 0405  NA 144 142  K 4.5 4.2  CL 110 107  CO2 17* 19  GLUCOSE 127* 90  BUN 72* 90*  CREATININE 3.90* 4.47*    Recent Labs  09/17/13 1245 09/17/13 2044  TROPONINI <0.30 <0.30    Cardiac Studies: 2D Echo: Study Conclusions  - Left ventricle: The cavity size was normal. Wall thickness was increased in a pattern of moderate LVH. The estimated ejection fraction was 60%. Wall motion was normal; there were no regional wall motion abnormalities. - Left atrium: The atrium was mildly dilated. - Pulmonary arteries: PA peak pressure: 54mm Hg (S). - Pericardium, extracardiac: A large pericardial effusion was identified circumferential to the heart. - Impressions: There is partial collapse of the IVC. There is no definite evidence of tamponade. Impressions:  - There is partial collapse of the IVC. There is no definite evidence of tamponade.   Tele: Sinus rhythm, personally reviewed.  Assessment/Plan:  1. Acute hypoxic respiratory failure  2. Acute on chronic diastolic CHF  3. Large pericardial effusion  4.  Acute on chronic kidney disease stage IV  5. HTN  6. Acute on chronic anemia  7. Thrombocytopenia, plt 60,000  I have reviewed the patient's echo images and discussed his case with Dr Hochrein. He has a large pericardial effusion without clear evidence of tamponade, but is noted to have acute on chronic heart failure and progressive dyspnea. I agree that pericardiocentesis is indicated for therapeutic and potentially diagnostic purposes. I have explained the procedure to the patient and he seems to understand. Risks, indications, and alternatives have been reviewed with his wife who is agreeable.   BUN/creatinine are worsening. I am going to hold high-dose diuretics until he is seen by the renal team later today.  Vaishnavi Dalby, M.D. 09/19/2013, 7:21 AM     

## 2013-09-19 NOTE — Progress Notes (Signed)
    Subjective:  No Chest pain. Dyspnea unchanged.  Objective:  Vital Signs in the last 24 hours: Temp:  [97.6 F (36.4 C)-98.4 F (36.9 C)] 97.9 F (36.6 C) (10/10 0400) Pulse Rate:  [72-76] 76 (10/09 2357) Resp:  [15-22] 18 (10/09 2357) BP: (112-121)/(43-84) 112/57 mmHg (10/09 2357) SpO2:  [96 %-100 %] 100 % (10/10 0314) Weight:  [148 lb 9.4 oz (67.4 kg)] 148 lb 9.4 oz (67.4 kg) (10/10 0500)  Intake/Output from previous day: 10/09 0701 - 10/10 0700 In: 225 [P.O.:222; I.V.:3] Out: 1900 [Urine:1900]  Physical Exam: Pt is alert and oriented, elderly male in NAD HEENT: normal Neck: JVP - moderately elevated Lungs: CTA bilaterally CV: RRR, distant Abd: soft, NT, obese Ext: no C/C/E Skin: warm/dry no rash   Lab Results:  Recent Labs  09/17/13 0747 09/18/13 0400  WBC 4.3 7.2  HGB 9.5* 8.4*  PLT 56* 60*    Recent Labs  09/18/13 0400 09/19/13 0405  NA 144 142  K 4.5 4.2  CL 110 107  CO2 17* 19  GLUCOSE 127* 90  BUN 72* 90*  CREATININE 3.90* 4.47*    Recent Labs  09/17/13 1245 09/17/13 2044  TROPONINI <0.30 <0.30    Cardiac Studies: 2D Echo: Study Conclusions  - Left ventricle: The cavity size was normal. Wall thickness was increased in a pattern of moderate LVH. The estimated ejection fraction was 60%. Wall motion was normal; there were no regional wall motion abnormalities. - Left atrium: The atrium was mildly dilated. - Pulmonary arteries: PA peak pressure: 54mm Hg (S). - Pericardium, extracardiac: A large pericardial effusion was identified circumferential to the heart. - Impressions: There is partial collapse of the IVC. There is no definite evidence of tamponade. Impressions:  - There is partial collapse of the IVC. There is no definite evidence of tamponade.   Tele: Sinus rhythm, personally reviewed.  Assessment/Plan:  1. Acute hypoxic respiratory failure  2. Acute on chronic diastolic CHF  3. Large pericardial effusion  4.  Acute on chronic kidney disease stage IV  5. HTN  6. Acute on chronic anemia  7. Thrombocytopenia, plt 60,000  I have reviewed the patient's echo images and discussed his case with Dr Antoine Poche. He has a large pericardial effusion without clear evidence of tamponade, but is noted to have acute on chronic heart failure and progressive dyspnea. I agree that pericardiocentesis is indicated for therapeutic and potentially diagnostic purposes. I have explained the procedure to the patient and he seems to understand. Risks, indications, and alternatives have been reviewed with his wife who is agreeable.   BUN/creatinine are worsening. I am going to hold high-dose diuretics until he is seen by the renal team later today.  Tonny Bollman, M.D. 09/19/2013, 7:21 AM

## 2013-09-19 NOTE — Progress Notes (Signed)
[facility] Progress Note  TEAM 1 - Stepdown ICU Team   [name] DGU:[id number] DOB: [birth year] DOA: 09/17/2013 PCP: [name], MD  Brief narrative: 67 year old male Cameron Mendez with known chronic kidney disease stage IV as well as diastolic congestive heart failure. Cameron has chronic anemia and chronic thrombocytopenia dating back to 2013. Cameron developed sudden onset of shortness of breath on the night of admission this was not associated with any chest pain or constitutional symptoms. In the ER Cameron was profoundly hypoxic and required BiPAP. Cameron was given IV Lasix. Chest x-ray was consistent with edema and CHF. Despite his poor kidney function Cameron Mendez endorsed Cameron is making his usual amount of urine. Cameron Mendez also has a known small to moderate pericardial effusion found on echocardiogram in 2010.  Assessment/Plan: Active Problems:   Acute respiratory failure with hypoxia due to:   A) DIASTOLIC HEART FAILURE, ACUTE ON CHRONIC   B) Acute pulmonary edema   C) COPD exacerbation - wheezing- likely due to COPD since not significant improvement and Scr up with aggressive diuresis -continue Coreg, supportive care and nebs -Cards consulted and ordered diuretics -renal function worsening and agree with holding diuretics for now -cont Levaquin to cover for bronchitis    Essential hypertension, benign -as above -cont Norvasc    Large Pericardial effusion -progressively enlarging over years -now large but without tamponade physiology -- pericardiocentesis 10/10 with 740 cc straw colored fluid removed-FU on any ordered cytology    CKD (chronic kidney disease) stage 4, GFR 15-29 ml/min -further increase in BUN/Scr since admit-? 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 Wall thickness was increased in a pattern of moderate LVH. The estimated ejection fraction was 60%. Wall motion was normal; there were no regional wall motion abnormalities. - Left atrium: The atrium was mildly dilated. - Pulmonary arteries: PA peak pressure: 54mm Hg (S). - Pericardium, extracardiac: A large pericardial effusion was identified circumferential to the heart. - Impressions: There is partial collapse of the IVC. There is no definite evidence of tamponade.   Antibiotics:  Levaquin 10/8 >>>  HPI/Subjective: Cameron Mendez alert and awake and denies shortness of breath at rest. No questions.  Objective: Blood pressure 112/57, pulse 68, temperature 97.5 F (36.4 C), temperature source Oral, resp. rate 14, height 5\' 4"  (1.626 m), weight 67.4 kg (148 lb 9.4 oz), SpO2 100.00%.  Intake/Output Summary (Last 24 hours) at 09/19/13 1113 Last data filed at 09/19/13 0500  Gross per 24 hour  Intake    225 ml  Output   1900 ml  Net  -1675 ml     Exam: General: No acute respiratory distress Lungs: Less coarse bilateral  wheezes, 3 L-100% Cardiovascular: Regular rate and rhythm without murmur gallop or rub normal S1 and S2, no peripheral edema or JVD Abdomen: Nontender, nondistended, soft, bowel sounds positive, no rebound, no ascites, no appreciable mass Musculoskeletal: No significant cyanosis, clubbing of bilateral lower extremities Neurological: Alert and oriented x 3 although has difficulty with recollection of current medical history including that Cameron last saw specific physicians, moves all extremities x 4 without focal neurological deficits, CN  2-12 intact  Scheduled Meds:  Scheduled Meds: .  albuterol  2.5 mg Nebulization Q4H  . allopurinol  100 mg Oral BID  . aspirin  81 mg Oral Daily  . aspirin EC  81 mg Oral Daily  . budesonide (PULMICORT) nebulizer solution  0.25 mg Nebulization BID  . calcitRIOL  0.25 mcg Oral Daily  . carvedilol  25 mg Oral BID WC  . darbepoetin (ARANESP) injection - DIALYSIS  100 mcg Subcutaneous Q Fri-HD  . ipratropium  0.5 mg Nebulization Q4H  . levofloxacin (LEVAQUIN) IV  500 mg Intravenous Q48H  . sodium bicarbonate  650 mg Oral BID  . sodium chloride  3 mL Intravenous Q12H  . tamsulosin  0.4 mg Oral QPC supper   Continuous Infusions:   **Reviewed in detail by the Attending Physicia  Data Reviewed: Basic Metabolic Panel:  Recent Labs Lab 09/17/13 0210 09/17/13 0747 09/18/13 0400 09/19/13 0405  NA 144 142 144 142  K 4.2 4.3 4.5 4.2  CL 113* 111 110 107  CO2 16* 17* 17* 19  GLUCOSE 136* 146* 127* 90  BUN 56* 59* 72* 90*  CREATININE 3.45* 3.62* 3.90* 4.47*  CALCIUM 8.3* 8.8 8.5 8.5  PHOS  --  2.9  --  4.7*   Liver Function Tests:  Recent Labs Lab 09/17/13 0747 09/19/13 0405  AST 11  --   ALT 9  --   ALKPHOS 79  --   BILITOT 0.4  --   PROT 8.4*  --   ALBUMIN 3.8 3.3*   No results found for this basename: LIPASE, AMYLASE,  in the last 168 hours No results found for this basename: AMMONIA,  in the last 168 hours CBC:  Recent Labs Lab 09/17/13 0210 09/17/13 0747 09/18/13 0400  WBC 5.7 4.3 7.2  NEUTROABS 4.9 3.9  --   HGB 9.3* 9.5* 8.4*  HCT 29.5* 29.5* 25.8*  MCV 95.2 93.9 93.5  PLT 64* 56* 60*   Cardiac Enzymes:  Recent Labs Lab 09/17/13 0425 09/17/13 0747 09/17/13 1245 09/17/13 2044  TROPONINI <0.30 <0.30 <0.30 <0.30   BNP (last 3 results)  Recent Labs  09/17/13 0425 09/17/13 0747  PROBNP 7483.0* 8483.0*   CBG: No results found for this basename: GLUCAP,  in the last 168 hours  Recent Results (from the past 240 hour(s))  MRSA PCR SCREENING     Status: None   Collection Time    09/17/13  5:11  AM      Result Value Range Status   MRSA by PCR NEGATIVE  NEGATIVE Final   Comment:            The GeneXpert MRSA Assay (FDA     approved for NASAL specimens     only), is one component of a     comprehensive MRSA colonization     surveillance program. It is not     intended to diagnose MRSA     infection nor to guide or     monitor treatment for     MRSA infections.     Studies:  Recent x-ray studies have been reviewed in detail by the Attending Physician     Junious Silk, ANP [facility] Office  [phone] Pager [phone]  **If unable to reach the above provider after paging please contact the Flow Manager @ [phone]  On-Call/Text Page:      [email or web address]      password [email or web address]  If 7PM-7AM, please contact night-coverage [email or web address] Password [email or web address] 09/19/2013, 11:13 AM  LOS: 2 days   I have examined the Cameron Mendez, reviewed the chart and modified the above note which I agree with.   Adajah Cocking,MD [phone] 09/19/2013, 12:54 PM

## 2013-09-20 DIAGNOSIS — D696 Thrombocytopenia, unspecified: Secondary | ICD-10-CM

## 2013-09-20 LAB — BASIC METABOLIC PANEL
BUN: 100 mg/dL — ABNORMAL HIGH (ref 6–23)
CO2: 18 mEq/L — ABNORMAL LOW (ref 19–32)
Chloride: 107 mEq/L (ref 96–112)
Creatinine, Ser: 4.55 mg/dL — ABNORMAL HIGH (ref 0.50–1.35)
GFR calc Af Amer: 14 mL/min — ABNORMAL LOW (ref >90)
Glucose, Bld: 93 mg/dL (ref 70–99)
Potassium: 4.4 mEq/L (ref 3.5–5.1)

## 2013-09-20 LAB — CBC
HCT: 29.1 % — ABNORMAL LOW (ref 39.0–52.0)
Hemoglobin: 9.3 g/dL — ABNORMAL LOW (ref 13.0–17.0)
MCH: 29.8 pg (ref 26.0–34.0)
MCHC: 32 g/dL (ref 30.0–36.0)
MCV: 93.3 fL (ref 78.0–100.0)
RBC: 3.12 MIL/uL — ABNORMAL LOW (ref 4.22–5.81)
RDW: 16.5 % — ABNORMAL HIGH (ref 11.5–15.5)

## 2013-09-20 MED ORDER — CARVEDILOL 25 MG PO TABS
25.0000 mg | ORAL_TABLET | Freq: Two times a day (BID) | ORAL | Status: DC
  Administered 2013-09-20 – 2013-09-23 (×7): 25 mg via ORAL
  Filled 2013-09-20 (×9): qty 1

## 2013-09-20 NOTE — Progress Notes (Signed)
SUBJECTIVE:  Positive chest pain related to the pericardial drain.  No acute distress   PHYSICAL EXAM Filed Vitals:   09/20/13 0500 09/20/13 0600 09/20/13 0700 09/20/13 0847  BP: 139/61 134/53 144/70   Pulse: 59 58 58   Temp:      TempSrc:      Resp: 12 10 21    Height:      Weight: 149 lb 11.1 oz (67.9 kg)     SpO2: 100% 99% 98% 97%   General:  No acute distress but slightly uncomfortable, Lungs:  Diffuse wheezing Heart:  RRR, no rub Abdomen:  Positive bowel sounds, no rebound no guarding Extremities:  No edema Neuro:  Blind, nonfocal  LABS:  Results for orders placed during the hospital encounter of 09/17/13 (from the past 24 hour(s))  BODY FLUID CELL COUNT WITH DIFFERENTIAL     Status: Abnormal   Collection Time    09/19/13 10:10 AM      Result Value Range   Fluid Type-FCT PERICARDIAL     Color, Fluid YELLOW  YELLOW   Appearance, Fluid CLEAR  CLEAR   WBC, Fluid 149  0 - 1000 cu mm   Neutrophil Count, Fluid 62 (*) 0 - 25 %   Lymphs, Fluid 14     Monocyte-Macrophage-Serous Fluid 24 (*) 50 - 90 %   Eos, Fluid 0    LACTATE DEHYDROGENASE, BODY FLUID     Status: Abnormal   Collection Time    09/19/13 10:10 AM      Result Value Range   LD, Fluid 106 (*) 3 - 23 U/L   Fluid Type-FLDH PERICARDIAL    BODY FLUID CULTURE     Status: None   Collection Time    09/19/13 10:10 AM      Result Value Range   Specimen Description FLUID PERICARDIAL     Special Requests FLUID     Gram Stain       Value: RARE WBC PRESENT, PREDOMINANTLY PMN     NO ORGANISMS SEEN     Performed at [facility]   Culture PENDING     Report Status PENDING    AMYLASE, BODY FLUID     Status: None   Collection Time    09/19/13 10:10 AM      Result Value Range   Amylase, Fluid 34     Fluid Type-FAMY PERICARDIAL    PROTEIN, BODY FLUID     Status: None   Collection Time    09/19/13 10:10 AM      Result Value Range   Total protein, fluid 5.1     Fluid Type-FTP PERICARDIAL      CREATININE, BODY FLUID     Status: None   Collection Time    09/19/13 10:10 AM      Result Value Range   Creat, Fluid 4.4     Fluid Type-FCRE PERICARDIAL    GLUCOSE, SEROUS FLUID     Status: None   Collection Time    09/19/13 10:10 AM      Result Value Range   Glucose, Fluid 97     Fluid Type-FGLU PERICARDIAL    PH, BODY FLUID     Status: None   Collection Time    09/19/13 10:10 AM      Result Value Range   pH, Fluid Type PERICARDIAL  FLUID     pH, Fluid 8.00    FERRITIN     Status: Abnormal   Collection Time  09/19/13  3:17 PM      Result Value Range   Ferritin 545 (*) 22 - 322 ng/mL  IRON AND TIBC     Status: Abnormal   Collection Time    09/19/13  3:17 PM      Result Value Range   Iron 60  42 - 135 ug/dL   TIBC 161 (*) 096 - 045 ug/dL   Saturation Ratios 37  20 - 55 %   UIBC 103 (*) 125 - 400 ug/dL  BASIC METABOLIC PANEL     Status: Abnormal   Collection Time    09/20/13  5:50 AM      Result Value Range   Sodium 140  135 - 145 mEq/L   Potassium 4.4  3.5 - 5.1 mEq/L   Chloride 107  96 - 112 mEq/L   CO2 18 (*) 19 - 32 mEq/L   Glucose, Bld 93  70 - 99 mg/dL   BUN 409 (*) 6 - 23 mg/dL   Creatinine, Ser 8.11 (*) 0.50 - 1.35 mg/dL   Calcium 8.1 (*) 8.4 - 10.5 mg/dL   GFR calc non Af Amer 12 (*) >90 mL/min   GFR calc Af Amer 14 (*) >90 mL/min  CBC     Status: Abnormal   Collection Time    09/20/13  5:50 AM      Result Value Range   WBC 7.0  4.0 - 10.5 K/uL   RBC 3.12 (*) 4.22 - 5.81 MIL/uL   Hemoglobin 9.3 (*) 13.0 - 17.0 g/dL   HCT 91.4 (*) 78.2 - 95.6 %   MCV 93.3  78.0 - 100.0 fL   MCH 29.8  26.0 - 34.0 pg   MCHC 32.0  30.0 - 36.0 g/dL   RDW 21.3 (*) 08.6 - 57.8 %   Platelets 62 (*) 150 - 400 K/uL    Intake/Output Summary (Last 24 hours) at 09/20/13 0947 Last data filed at 09/20/13 0800  Gross per 24 hour  Intake    900 ml  Output    760 ml  Net    140 ml    ASSESSMENT AND PLAN:  Pericardial effusion:  S/P pericardial tap.  Unfortunately his  breathing is not improved.  I pulled the pericardial drain.  He can have follow up echoes in the future.  If he has recurrent effusion he would need a window.  Cytology and cultures pending.  However, I suspect that this related to his renal insufficiency.  This has been a slowly growing effusion over a couple of years.  His dyspnea might in some part be related to CHF with diastolic dysfunction but was not likely related to the effusion as he has had no improvement.  We will follow up the studies on the pericardial fluid.   Fayrene Fearing [facility] 09/20/2013 9:47 AM

## 2013-09-20 NOTE — Progress Notes (Addendum)
[facility] Progress Note [place] TEAM 1 - Stepdown ICU Team   MIRAJ TRUSS ZOX:[id number] DOB: [birth year] DOA: 09/17/2013 PCP: [name], MD  Brief narrative: 67 year old male patient with known chronic kidney disease stage IV as well as diastolic congestive heart failure. He has chronic anemia and chronic thrombocytopenia dating back to 2013. He developed sudden onset of shortness of breath on the night of admission this was not associated with any chest pain or constitutional symptoms. In the ER he was profoundly hypoxic and required BiPAP. He was given IV Lasix. Chest x-ray was consistent with edema and CHF. Despite his poor kidney function patient endorsed he is making his usual amount of urine. Patient also has a known small to moderate pericardial effusion found on echocardiogram in 2010.  Assessment/Plan: Active Problems:   Acute respiratory failure with hypoxia due to:   A) DIASTOLIC HEART FAILURE, ACUTE ON CHRONIC   B) Acute pulmonary edema   C) COPD exacerbation- on chronic O2 at home- dose not have pulmonologist - wheezing- likely due to COPD since not significant improvement and Scr up with aggressive diuresis -continue Coreg, supportive care and nebs  -renal function worsening and agree with holding diuretics for now -cont Levaquin to cover for bronchitis - upper airway wheeze continues- vocal cord issues??- have consulted pulm     Essential hypertension, benign -as above -cont Norvasc    Large Pericardial effusion -progressively enlarging over years -now large but without tamponade physiology -- pericardiocentesis 10/10 with 740 cc straw colored fluid removed-currently blood tinged fluid.  - f/u on ANA - significant chest pain due to friction after fluid removed- unable to take NSAIDS due to ARF therefore started on Colchicine. Also being given Morphine and Vicodin PRN.    ARF on CKD (chronic kidney disease) stage 4, GFR 15-29 ml/min -further increase in  BUN/Scr since admit-? overdiuresis -Colchicine dc'd 10/9 -Nephrology following  Metabolic Acidosis - on bicarb tabs per renal     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    Anemia/Thrombocytopenia, chronic -stable   BPH -cont Flomax   DVT prophylaxis: SCDs Code Status: Full Family Communication: Patient only Disposition Plan/Expected LOS: Transferred to CCU post pericardiocentesis- will place orders for transfer to tele Isolation: None Nutritional Status: Likely has chronic protein calorie malnutrition related to multiple chronic illnesses  Consultants: Nephrology Cardiology  Procedures: TTE - Left ventricle: The cavity size was normal. Wall thickness was increased in a pattern of moderate LVH. The estimated ejection fraction was 60%. Wall motion was normal; there were no regional wall motion abnormalities. - Left atrium: The atrium was mildly dilated. - Pulmonary arteries: PA peak pressure: 54mm Hg (S). - Pericardium, extracardiac: A large pericardial effusion was identified circumferential to the heart. - Impressions: There is partial collapse of the IVC. There is no definite evidence of tamponade.   Antibiotics:  Levaquin 10/8 >>>  HPI/Subjective: Patient alert and awake - per RN he has severe chest pain this morning and received Morphine and Hydrocodone- Dr Antoine Poche has just removed chest tube.   Objective: Blood pressure 144/70, pulse 58, temperature 97.8 F (36.6 C), temperature source Oral, resp. rate 21, height 5\' 4"  (1.626 m), weight 67.9 kg (149 lb 11.1 oz), SpO2 97.00%.  Intake/Output Summary (Last 24 hours) at 09/20/13 0950 Last data filed at 09/20/13 0800  Gross per 24 hour  Intake    900 ml  Output    760 ml  Net    140 ml     Exam: General: No  acute respiratory distress Lungs:  coarse bilateral  wheezes, 3 L-100%- crackles at bases Cardiovascular: Regular rate and rhythm without murmur gallop or rub normal S1 and S2, no peripheral edema or JVD-  chest tube in place Abdomen: Nontender, nondistended, soft, bowel sounds positive, no rebound, no ascites, no appreciable mass Musculoskeletal: No significant cyanosis, clubbing of bilateral lower extremities Neurological: Alert and oriented x 3 although has difficulty with recollection of current medical history including that he last saw specific physicians, moves all extremities x 4 without focal neurological deficits, CN 2-12 intact  Scheduled Meds:  Scheduled Meds: . albuterol  2.5 mg Nebulization Q4H  . allopurinol  100 mg Oral BID  . aspirin EC  81 mg Oral Daily  . budesonide (PULMICORT) nebulizer solution  0.25 mg Nebulization BID  . calcitRIOL  0.25 mcg Oral Daily  . carvedilol  25 mg Oral BID WC  . colchicine  0.3 mg Oral Daily  . darbepoetin (ARANESP) injection - DIALYSIS  100 mcg Subcutaneous Q Fri-HD  . ipratropium  0.5 mg Nebulization Q4H  . levofloxacin (LEVAQUIN) IV  500 mg Intravenous Q48H  . sodium bicarbonate  1,300 mg Oral BID  . sodium chloride  3 mL Intravenous Q12H  . tamsulosin  0.4 mg Oral QPC supper   Continuous Infusions:   **Reviewed in detail by the Attending Physicia  Data Reviewed: Basic Metabolic Panel:  Recent Labs Lab 09/17/13 0210 09/17/13 0747 09/18/13 0400 09/19/13 0405 09/20/13 0550  NA 144 142 144 142 140  K 4.2 4.3 4.5 4.2 4.4  CL 113* 111 110 107 107  CO2 16* 17* 17* 19 18*  GLUCOSE 136* 146* 127* 90 93  BUN 56* 59* 72* 90* 100*  CREATININE 3.45* 3.62* 3.90* 4.47* 4.55*  CALCIUM 8.3* 8.8 8.5 8.5 8.1*  PHOS  --  2.9  --  4.7*  --    Liver Function Tests:  Recent Labs Lab 09/17/13 0747 09/19/13 0405  AST 11  --   ALT 9  --   ALKPHOS 79  --   BILITOT 0.4  --   PROT 8.4*  --   ALBUMIN 3.8 3.3*   No results found for this basename: LIPASE, AMYLASE,  in the last 168 hours No results found for this basename: AMMONIA,  in the last 168 hours CBC:  Recent Labs Lab 09/17/13 0210 09/17/13 0747 09/18/13 0400 09/20/13 0550    WBC 5.7 4.3 7.2 7.0  NEUTROABS 4.9 3.9  --   --   HGB 9.3* 9.5* 8.4* 9.3*  HCT 29.5* 29.5* 25.8* 29.1*  MCV 95.2 93.9 93.5 93.3  PLT 64* 56* 60* 62*   Cardiac Enzymes:  Recent Labs Lab 09/17/13 0425 09/17/13 0747 09/17/13 1245 09/17/13 2044  TROPONINI <0.30 <0.30 <0.30 <0.30   BNP (last 3 results)  Recent Labs  09/17/13 0425 09/17/13 0747  PROBNP 7483.0* 8483.0*   CBG: No results found for this basename: GLUCAP,  in the last 168 hours  Recent Results (from the past 240 hour(s))  MRSA PCR SCREENING     Status: None   Collection Time    09/17/13  5:11 AM      Result Value Range Status   MRSA by PCR NEGATIVE  NEGATIVE Final   Comment:            The GeneXpert MRSA Assay (FDA     approved for NASAL specimens     only), is one component of a     comprehensive MRSA colonization  surveillance program. It is not     intended to diagnose MRSA     infection nor to guide or     monitor treatment for     MRSA infections.  BODY FLUID CULTURE     Status: None   Collection Time    09/19/13 10:10 AM      Result Value Range Status   Specimen Description FLUID PERICARDIAL   Final   Special Requests FLUID   Final   Gram Stain     Final   Value: RARE WBC PRESENT, PREDOMINANTLY PMN     NO ORGANISMS SEEN     Performed at [facility]   Culture PENDING   Incomplete   Report Status PENDING   Incomplete     Studies:  Recent x-ray studies have been reviewed in detail by the Attending Physician    **If unable to reach the above provider after paging please contact the Flow Manager @ [phone]  On-Call/Text Page:      [email or web address]      password [email or web address]  If 7PM-7AM, please contact night-coverage [email or web address] Password [email or web address] 09/20/2013, 9:50 AM   LOS: 3 days   I have examined the patient, reviewed the chart and modified the above note which I agree with.   Madelyn Tlatelpa,MD [phone] 09/20/2013, 9:50 AM

## 2013-09-20 NOTE — Progress Notes (Signed)
Admit: 09/17/2013 LOS: 3  30M CKD4 (Followed by Briant Cedar, BL SCr mid 3s, has AVF) admitted with SOB, likey acute diastolic CHF exacerbation, and large pericardial effusion (drained 10/10 ).  Has AoCKD.    Subjective:  Mild hypotension and CP yesterday, rec NS bolus Pt thirsty this AM No SOB Cr and BUN increased today  10/10 0701 - 10/11 0700 In: 900 [P.O.:640; I.V.:260] Out: 685 [Urine:450; Drains:235]  Filed Weights   09/18/13 0455 09/19/13 0500 09/20/13 0500  Weight: 72.7 kg (160 lb 4.4 oz) 67.4 kg (148 lb 9.4 oz) 67.9 kg (149 lb 11.1 oz)    Current meds: reviewed including calcitrol 0.25 daily, aranesp (10/10 last dose), levofloxacin, NaHCO3 650 BID (Inc to 1300 BId today) Current Labs: reviewed    Physical Exam:  Blood pressure 144/70, pulse 58, temperature 97.8 F (36.6 C), temperature source Oral, resp. rate 21, height 5\' 4"  (1.626 m), weight 67.9 kg (149 lb 11.1 oz), SpO2 98.00%. NAD, groggy, appropriate RRR, nl s1s2,  PULM: b/l end exp wheezing, occ rhonchorus sounds ABD: s/nt/nd EXT: no LEE currently; LUA AVF +B/T SKIN: pericardial drain bandaged; NERUO: no asterixis. nonfocal ENT: poor dentition EYES: muddy sclera GU: condom cath on.   Assessment/Plan 1. AoCKD: BP ok, no LEE, and stable on 3L [place].  Pt is thirsty.  I still wonder if this is from aggressive diuresis and would hold diuresis for at least another 24h.  Pt with nl LVEF but clearly came in with hypoxia and resp distress probably diastolic issues.  At least today I'd be generous with PO intake and consider IVFs.  Hopefully will improve as intravascular volume returns.  If continues to worsen might need to pursue RRT during hospitalization.  Electrolytes ok.  No uremic symptoms.   2. HTN/Vol: as above, hold diuretics for another 24h.  BP ok.  No LEE.  Breathing markedly improved with SpO2 100% on 3L.  Amlodipine is on hold. 3. Pericardial Effusion: drain in place, studies in lab.  ANA  ordered.  Per cardiology. 4. 2HPTH: on calcitriol, Ca is 8.1 5. Anemia: On aranesp.  Follow 6. Wheezing: SMoker.  ? COPD.  On albuterol and ipratropium nebs standing  .   7. MEtabolic acidosis: increase NaHCO3 to 1300 BID. Follow.   8. L UA of AVF: appears immature, +B/T.   Sabra Heck MD 09/20/2013, 8:45 AM   Recent Labs Lab 09/17/13 0210 09/17/13 0747 09/18/13 0400 09/19/13 0405 09/20/13 0550  NA 144 142 144 142 140  K 4.2 4.3 4.5 4.2 4.4  CL 113* 111 110 107 107  CO2 16* 17* 17* 19 18*  GLUCOSE 136* 146* 127* 90 93  BUN 56* 59* 72* 90* 100*  CREATININE 3.45* 3.62* 3.90* 4.47* 4.55*  CALCIUM 8.3* 8.8 8.5 8.5 8.1*  PHOS  --  2.9  --  4.7*  --     Recent Labs Lab 09/17/13 0210 09/17/13 0747 09/18/13 0400 09/20/13 0550  WBC 5.7 4.3 7.2 7.0  NEUTROABS 4.9 3.9  --   --   HGB 9.3* 9.5* 8.4* 9.3*  HCT 29.5* 29.5* 25.8* 29.1*  MCV 95.2 93.9 93.5 93.3  PLT 64* 56* 60* 62*

## 2013-09-20 NOTE — Progress Notes (Signed)
Spouse at bedside ,feeding Pt .

## 2013-09-21 ENCOUNTER — Inpatient Hospital Stay (HOSPITAL_COMMUNITY): Payer: Medicare Other

## 2013-09-21 DIAGNOSIS — I10 Essential (primary) hypertension: Secondary | ICD-10-CM

## 2013-09-21 DIAGNOSIS — R062 Wheezing: Secondary | ICD-10-CM

## 2013-09-21 DIAGNOSIS — D649 Anemia, unspecified: Secondary | ICD-10-CM

## 2013-09-21 DIAGNOSIS — I5033 Acute on chronic diastolic (congestive) heart failure: Principal | ICD-10-CM

## 2013-09-21 DIAGNOSIS — I319 Disease of pericardium, unspecified: Secondary | ICD-10-CM

## 2013-09-21 LAB — BASIC METABOLIC PANEL
BUN: 111 mg/dL — ABNORMAL HIGH (ref 6–23)
CO2: 16 mEq/L — ABNORMAL LOW (ref 19–32)
Creatinine, Ser: 4.79 mg/dL — ABNORMAL HIGH (ref 0.50–1.35)
GFR calc non Af Amer: 11 mL/min — ABNORMAL LOW (ref >90)
Glucose, Bld: 97 mg/dL (ref 70–99)
Potassium: 4.2 mEq/L (ref 3.5–5.1)

## 2013-09-21 MED ORDER — ALBUTEROL SULFATE (5 MG/ML) 0.5% IN NEBU
2.5000 mg | INHALATION_SOLUTION | Freq: Four times a day (QID) | RESPIRATORY_TRACT | Status: DC
  Administered 2013-09-22 – 2013-09-26 (×10): 2.5 mg via RESPIRATORY_TRACT
  Filled 2013-09-21 (×14): qty 0.5

## 2013-09-21 MED ORDER — SODIUM BICARBONATE 650 MG PO TABS
1300.0000 mg | ORAL_TABLET | Freq: Three times a day (TID) | ORAL | Status: DC
  Administered 2013-09-21 – 2013-09-22 (×5): 1300 mg via ORAL
  Filled 2013-09-21 (×8): qty 2

## 2013-09-21 MED ORDER — SODIUM CHLORIDE 0.9 % IV SOLN
INTRAVENOUS | Status: AC
  Administered 2013-09-21: 11:00:00 via INTRAVENOUS

## 2013-09-21 MED ORDER — LEVOFLOXACIN 500 MG PO TABS
500.0000 mg | ORAL_TABLET | ORAL | Status: AC
  Administered 2013-09-21 – 2013-09-25 (×3): 500 mg via ORAL
  Filled 2013-09-21 (×3): qty 1

## 2013-09-21 MED ORDER — PREDNISONE 20 MG PO TABS
40.0000 mg | ORAL_TABLET | Freq: Two times a day (BID) | ORAL | Status: DC
  Administered 2013-09-21 – 2013-09-22 (×2): 40 mg via ORAL
  Filled 2013-09-21 (×4): qty 2

## 2013-09-21 MED ORDER — IPRATROPIUM BROMIDE 0.02 % IN SOLN
0.5000 mg | Freq: Four times a day (QID) | RESPIRATORY_TRACT | Status: DC
  Administered 2013-09-22 – 2013-09-26 (×10): 0.5 mg via RESPIRATORY_TRACT
  Filled 2013-09-21 (×14): qty 2.5

## 2013-09-21 NOTE — Progress Notes (Signed)
[facility] Progress Note   Cameron Mendez ZOX:[id number] DOB: Apr 14, 1946 DOA: 09/17/2013 PCP: [name], MD  Brief narrative: 67 year old male patient with known chronic kidney disease stage IV as well as diastolic congestive heart failure. He has chronic anemia and chronic thrombocytopenia dating back to 2013. He developed sudden onset of shortness of breath on the night of admission this was not associated with any chest pain or constitutional symptoms. In the ER he was profoundly hypoxic and required BiPAP. He was given IV Lasix. Chest x-ray was consistent with edema and CHF. Despite his poor kidney function patient endorsed he is making his usual amount of urine. Patient also has a known small to moderate pericardial effusion found on echocardiogram in 2010, larger this time and required pericardiocentesis (10/10). Patient use oxygen chronically at home and had hx of COPD actively still smoking (no pulmonologist in outpatient setting)  Assessment/Plan:  Acute respiratory failure with hypoxia due to:   A) DIASTOLIC HEART FAILURE, ACUTE ON CHRONIC   B) Acute pulmonary edema   C) COPD exacerbation- on chronic O2 at home- do not have pulmonologist   D)bronchitis/bronchiectasis   E)pericardial effusion - wheezing- likely due to COPD since not significant improvement and Scr up with aggressive diuresis. -status post pericardiocentesis w/o significant change in his breathing status. -continue Coreg, supportive care and nebs -will continue pulmicort and start prednisone; pulmonology has been consulted, will follow rec's -renal function worsening and agree with holding diuretics for now and providing gentle hydration; if continue worsening will benefit of HD -cont Levaquin to cover for bronchitis/bronchiectasis  Essential hypertension, benign -well controlled.  -continue coreg.  -continue holding norvasc and now lasix (patient had episode of hypotension while receiving coreg, norvasc  and IV lasix)  Large Pericardial effusion -progressively enlarging over years -now large but without tamponade physiology -- pericardiocentesis 10/10 with 740 cc straw colored fluid removed-currently blood tinged fluid.  - f/u on ANA and cytology - significant chest pain due to friction after fluid removed- unable to take NSAIDS due to ARF therefore started on Colchicine.  -Also being given Morphine and Vicodin PRN.   ARF on CKD (chronic kidney disease) stage 4, GFR 15-29 ml/min -further increase in BUN/Scr since admit- most likely due to overdiuresis -Nephrology following -plan is for gentle hydration in the next 24 hours and if needed HD -Cr 4.79 today  Metabolic Acidosis - on bicarb tabs per renal   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  Anemia/Thrombocytopenia, chronic -stable -continue aranesp   BPH -cont Flomax  Tobacco abuse:  -cessation counseling provided.   DVT prophylaxis: SCDs  Code Status: Full Family Communication: wife at bedside Disposition: to be determine  Consultants: Nephrology Cardiology  Procedures: TTE - Left ventricle: The cavity size was normal. Wall thickness was increased in a pattern of moderate LVH. The estimated ejection fraction was 60%. Wall motion was normal; there were no regional wall motion abnormalities. - Left atrium: The atrium was mildly dilated. - Pulmonary arteries: PA peak pressure: 54mm Hg (S). - Pericardium, extracardiac: A large pericardial effusion was identified circumferential to the heart. - Impressions: There is partial collapse of the IVC. There is no definite evidence of tamponade.   Antibiotics:  Levaquin 10/8 >>>  HPI/Subjective: Patient alert and awake - he denies any pain and is breathing slightly better. No fever  Objective: Blood pressure 144/80, pulse 74, temperature 98.1 F (36.7 C), temperature source Oral, resp. rate 16, height 5\' 4"  (1.626 m), weight 68.1 kg (150 lb 2.1 oz), SpO2  98.00%.  Intake/Output Summary (Last 24 hours) at 09/21/13 1325 Last data filed at 09/21/13 6962  Gross per 24 hour  Intake    340 ml  Output    351 ml  Net    -11 ml     Exam: General: No acute respiratory distress Lungs:  bilateral  wheezes, 3 L-100%; mild crackles at bases and decrease BS Cardiovascular: Regular rate and rhythm without murmur gallop or rub normal S1 and S2, no peripheral edema or JVD Abdomen: Nontender, nondistended, soft, bowel sounds positive, no rebound, no ascites, no appreciable mass Musculoskeletal: No significant cyanosis, clubbing of bilateral lower extremities Neurological: Alert and oriented x 3 although has difficulty with recollection of current medical history including that he last saw specific physicians, moves all extremities x 4 without focal neurological deficits, CN 2-12 intact  Scheduled Meds:  Scheduled Meds: . albuterol  2.5 mg Nebulization Q4H  . allopurinol  100 mg Oral BID  . aspirin EC  81 mg Oral Daily  . budesonide (PULMICORT) nebulizer solution  0.25 mg Nebulization BID  . calcitRIOL  0.25 mcg Oral Daily  . carvedilol  25 mg Oral BID WC  . colchicine  0.3 mg Oral Daily  . darbepoetin (ARANESP) injection - DIALYSIS  100 mcg Subcutaneous Q Fri-HD  . ipratropium  0.5 mg Nebulization Q4H  . levofloxacin  500 mg Oral Q48H  . predniSONE  40 mg Oral BID WC  . sodium bicarbonate  1,300 mg Oral TID  . sodium chloride  3 mL Intravenous Q12H  . tamsulosin  0.4 mg Oral QPC supper   Continuous Infusions: . sodium chloride      Data Reviewed: Basic Metabolic Panel:  Recent Labs Lab 09/17/13 0210 09/17/13 0747 09/18/13 0400 09/19/13 0405 09/20/13 0550 09/21/13 0530  NA 144 142 144 142 140 139  K 4.2 4.3 4.5 4.2 4.4 4.2  CL 113* 111 110 107 107 106  CO2 16* 17* 17* 19 18* 16*  GLUCOSE 136* 146* 127* 90 93 97  BUN 56* 59* 72* 90* 100* 111*  CREATININE 3.45* 3.62* 3.90* 4.47* 4.55* 4.79*  CALCIUM 8.3* 8.8 8.5 8.5 8.1* 7.8*   PHOS  --  2.9  --  4.7*  --   --    Liver Function Tests:  Recent Labs Lab 09/17/13 0747 09/19/13 0405  AST 11  --   ALT 9  --   ALKPHOS 79  --   BILITOT 0.4  --   PROT 8.4*  --   ALBUMIN 3.8 3.3*   CBC:  Recent Labs Lab 09/17/13 0210 09/17/13 0747 09/18/13 0400 09/20/13 0550  WBC 5.7 4.3 7.2 7.0  NEUTROABS 4.9 3.9  --   --   HGB 9.3* 9.5* 8.4* 9.3*  HCT 29.5* 29.5* 25.8* 29.1*  MCV 95.2 93.9 93.5 93.3  PLT 64* 56* 60* 62*   Cardiac Enzymes:  Recent Labs Lab 09/17/13 0425 09/17/13 0747 09/17/13 1245 09/17/13 2044  TROPONINI <0.30 <0.30 <0.30 <0.30   BNP (last 3 results)  Recent Labs  09/17/13 0425 09/17/13 0747  PROBNP 7483.0* 8483.0*    Recent Results (from the past 240 hour(s))  MRSA PCR SCREENING     Status: None   Collection Time    09/17/13  5:11 AM      Result Value Range Status   MRSA by PCR NEGATIVE  NEGATIVE Final   Comment:            The GeneXpert MRSA Assay (FDA  approved for NASAL specimens     only), is one component of a     comprehensive MRSA colonization     surveillance program. It is not     intended to diagnose MRSA     infection nor to guide or     monitor treatment for     MRSA infections.  BODY FLUID CULTURE     Status: None   Collection Time    09/19/13 10:10 AM      Result Value Range Status   Specimen Description FLUID PERICARDIAL   Final   Special Requests FLUID   Final   Gram Stain     Final   Value: RARE WBC PRESENT, PREDOMINANTLY PMN     NO ORGANISMS SEEN     Performed at [facility]   Culture     Final   Value: NO GROWTH 2 DAYS     Performed at [facility]   Report Status PENDING   Incomplete     Chevette Fee,MD [phone] 09/21/2013, 1:25 PM   If 7PM-7AM, please contact night-coverage [email or web address] Password [email or web address] 09/21/2013, 1:25 PM   LOS: 4 days

## 2013-09-21 NOTE — Progress Notes (Signed)
Pt resting on bed comfortable, Morphine 2mg  IV given as needed for pain with relieve.

## 2013-09-21 NOTE — Progress Notes (Signed)
    SUBJECTIVE: No acute distress.  Chronically ill appearing. Some tenderness at the pericardiocentesis site.     PHYSICAL EXAM Filed Vitals:   09/21/13 0006 09/21/13 0100 09/21/13 0313 09/21/13 0500  BP:  119/62  144/80  Pulse:  71  74  Temp:  98.4 F (36.9 C)  98.1 F (36.7 C)  TempSrc:  Oral  Oral  Resp:  16  16  Height:      Weight:    150 lb 2.1 oz (68.1 kg)  SpO2: 98% 99% 98% 98%   General:  No acute distress  Lungs:  Diffuse wheezing and coarse crackles.  Heart:  RRR, no rub Abdomen:  Positive bowel sounds, no rebound no guarding Extremities:  No edema  LABS:  Results for orders placed during the hospital encounter of 09/17/13 (from the past 24 hour(s))  BASIC METABOLIC PANEL     Status: Abnormal   Collection Time    09/21/13  5:30 AM      Result Value Range   Sodium 139  135 - 145 mEq/L   Potassium 4.2  3.5 - 5.1 mEq/L   Chloride 106  96 - 112 mEq/L   CO2 16 (*) 19 - 32 mEq/L   Glucose, Bld 97  70 - 99 mg/dL   BUN 161 (*) 6 - 23 mg/dL   Creatinine, Ser 0.96 (*) 0.50 - 1.35 mg/dL   Calcium 7.8 (*) 8.4 - 10.5 mg/dL   GFR calc non Af Amer 11 (*) >90 mL/min   GFR calc Af Amer 13 (*) >90 mL/min    Intake/Output Summary (Last 24 hours) at 09/21/13 1216 Last data filed at 09/21/13 0952  Gross per 24 hour  Intake    460 ml  Output    401 ml  Net     59 ml    ASSESSMENT AND PLAN:  Pericardial effusion:  S/P pericardial tap.  His breathing did not improve with diuresis of a couple of liters in the hospital or the percidardial tap.  No further cardiac evaluation is planned.  He should have a follow up echocardiogram in 2 - 3 months.  He would need a window if this effusion reaccumulates.  I agree with no further diuresis.  Renal is hydrating.  He might need dialysis.    Fayrene Fearing [facility] 09/21/2013 12:16 PM

## 2013-09-21 NOTE — Progress Notes (Signed)
Admit: 09/17/2013 LOS: 4  45M CKD4 (Followed by Briant Cedar, BL SCr mid 3s, has AVF) admitted with SOB, likey acute diastolic CHF exacerbation, and large pericardial effusion (drained 10/10 ).  Has AoCKD.    Subjective:  Moved out of ICU yesterday Spoke with nursing, pt eating well, drinking well Has condom catheter Pt states he is thirsty Denies SOB, N/V, dysgeusia  10/11 0701 - 10/12 0700 In: 820 [P.O.:820] Out: 626 [Urine:550; Drains:75; Stool:1]  Filed Weights   09/20/13 0500 09/20/13 1504 09/21/13 0500  Weight: 67.9 kg (149 lb 11.1 oz) 68.6 kg (151 lb 3.8 oz) 68.1 kg (150 lb 2.1 oz)    Current meds: reviewed including calcitrol 0.25 daily, aranesp (10/10 last dose), levofloxacin, NaHCO3 650 BID (Inc to 1300 TID today) Current Labs: reviewed    Physical Exam:  Blood pressure 144/80, pulse 74, temperature 98.1 F (36.7 C), temperature source Oral, resp. rate 16, height 5\' 4"  (1.626 m), weight 68.1 kg (150 lb 2.1 oz), SpO2 98.00%. NAD, answsers questions, lying in bed RRR, nl s1s2 PULM: b/l end exp wheezing, occ rhonchorus sounds.  Some crackles in bases ABD: s/nt/nd EXT: no LEE currently; LUA AVF +B/T SKIN: pericardial drain bandaged; NERUO: no asterixis. nonfocal ENT: poor dentition EYES: muddy sclera GU: condom cath on.   Assessment/Plan 1. AoCKD: BP ok, no LEE, and stable on 3L [place].  Despite no lasix now for 48h BUN and SCr continue to worsen.  Acidosis persists.  Potassium is normal.  Will give very gentle hydration over course of today with careful monitoring of resp status.  If this fails to improve his renal issues, I think he'll need dialysis.  He does have bibasilar crackles likely some atelectasis and perhaps some component of edema too.  Check renal US given just a condom catheter to ensure no obstructive component given his age.   2. HTN/Vol: as above, cont to hold diuretics.  BP ok.  No LEE.  Breathing markedly improved with SpO2 100% on 3L.  Amlodipine  is on hold.  3. Pericardial Effusion: Drain removed,  ANA ordered.  Per cardiology. 4. 2HPTH: on calcitriol, Ca is 7.8, Phos 4.7 5. Anemia: On aranesp.  Follow.  Appears Fe replete.   6. Wheezing: Smoker.  ? COPD.  On albuterol and ipratropium nebs standing  .   7. Metabolic acidosis: increase NaHCO3 to 1300 TID. Follow.   8. L UA of AVF: appears potentially small but could attempt use if needed as above, +B/T.   Sabra Heck MD 09/21/2013, 10:15 AM   Recent Labs Lab 09/17/13 0210 09/17/13 0747  09/19/13 0405 09/20/13 0550 09/21/13 0530  NA 144 142  < > 142 140 139  K 4.2 4.3  < > 4.2 4.4 4.2  CL 113* 111  < > 107 107 106  CO2 16* 17*  < > 19 18* 16*  GLUCOSE 136* 146*  < > 90 93 97  BUN 56* 59*  < > 90* 100* 111*  CREATININE 3.45* 3.62*  < > 4.47* 4.55* 4.79*  CALCIUM 8.3* 8.8  < > 8.5 8.1* 7.8*  PHOS  --  2.9  --  4.7*  --   --   < > = values in this interval not displayed.  Recent Labs Lab 09/17/13 0210 09/17/13 0747 09/18/13 0400 09/20/13 0550  WBC 5.7 4.3 7.2 7.0  NEUTROABS 4.9 3.9  --   --   HGB 9.3* 9.5* 8.4* 9.3*  HCT 29.5* 29.5* 25.8* 29.1*  MCV 95.2  93.9 93.5 93.3  PLT 64* 56* 60* 62*

## 2013-09-21 NOTE — Consult Note (Signed)
Dictation #:  [id number]

## 2013-09-22 ENCOUNTER — Inpatient Hospital Stay (HOSPITAL_COMMUNITY): Payer: Medicare Other

## 2013-09-22 LAB — RENAL FUNCTION PANEL
Albumin: 2.9 g/dL — ABNORMAL LOW (ref 3.5–5.2)
BUN: 107 mg/dL — ABNORMAL HIGH (ref 6–23)
Calcium: 7.6 mg/dL — ABNORMAL LOW (ref 8.4–10.5)
Chloride: 105 mEq/L (ref 96–112)
Creatinine, Ser: 4.55 mg/dL — ABNORMAL HIGH (ref 0.50–1.35)
GFR calc non Af Amer: 12 mL/min — ABNORMAL LOW (ref >90)
Sodium: 138 mEq/L (ref 135–145)

## 2013-09-22 LAB — BODY FLUID CULTURE

## 2013-09-22 MED ORDER — PREDNISONE 20 MG PO TABS
40.0000 mg | ORAL_TABLET | Freq: Every day | ORAL | Status: DC
  Administered 2013-09-23: 40 mg via ORAL
  Filled 2013-09-22 (×2): qty 2

## 2013-09-22 MED ORDER — DARBEPOETIN ALFA-POLYSORBATE 100 MCG/0.5ML IJ SOLN: 100.0000 ug | INTRAMUSCULAR | Status: DC

## 2013-09-22 NOTE — Consult Note (Signed)
Cameron Mendez, Cameron Mendez NO.:  [id number]  MEDICAL RECORD NO.:  [id number]  LOCATION:  4E29C                        FACILITY:  [facility]  PHYSICIAN:  Barbaraann Share, MD,FCCPDATE OF BIRTH:  10/17/1946  DATE OF CONSULTATION:  09/21/2013 DATE OF DISCHARGE:                                CONSULTATION   REFERRING PHYSICIAN:  [facility].  HISTORY OF PRESENT ILLNESS:  Patient is a 67 year old male who I have been asked to see for evaluation of wheezing.  The patient has known cardiac disease with congestive heart failure, as well as chronic renal failure.  He has a history of smoking, but he is unable to tell me how much and for how long, and it is unclear if he is continuing to smoke or not.  According to his family, he has never had pulmonary function studies.  The patient was admitted on September 17, 2013, with worsening shortness of breath, and found to be in congestive heart failure and had a large pericardial effusion.  He has been diuresed aggressively, and has also had a drainage procedure for his pericardial effusion.  Despite this, the patient continues to have audible high-pitched wheeze that is most likely coming from his upper airway.  The question has also been raised whether the patient may have COPD.  He is a very difficult historian, and his family is not able to shed a lot of light on his symptoms over the years.  He is currently being treated with nebulized bronchodilators, as well as prednisone.  He does not have any dyspnea on exertion at rest, and his oxygen saturations are nearly 100% on supplemental oxygen.  PAST MEDICAL HISTORY: 1. Significant for chronic diastolic dysfunction. 2. History of anemia. 3. Retinitis pigmentosa with blindness. 4. History of gouty arthritis. 5. History of hypertension. 6. History of chronic renal failure. 7. History of BPH.  The patient is intolerant of ibuprofen.  SOCIAL HISTORY:  The patient lives at home,  and it is difficulty to get any kind of smoking history out of him currently.  FAMILY HISTORY:  Remarkable for his mother having ovarian cancer, otherwise is noncontributory.  Ten point review of systems was unremarkable except for that listed in the history of present illness.  PHYSICAL EXAMINATION:  GENERAL:  He is an overweight male in no acute distress. VITAL SIGNS:  Blood pressure is 129/56.  Pulse is 66, respiratory rate 17.  He is afebrile.  Oxygen saturation on 3 L is 98%. HEENT:  Pupils are poorly reactive, extraocular muscles are intact. Nares are patent without discharge.  Oropharynx is clear. NECK:  Supple without lymphadenopathy or thyromegaly.  There is prominent JVD noted, even with the patient in an upright position. CHEST:  Reveals dense crackles in both bases and throughout all lung fields.  There is no true wheezing.  There is a very prominent upper airway pseudo wheezing noted that is high pitched in nature. CARDIAC:  Reveals a regular rhythm with a 2/6 systolic murmur. ABDOMEN:  Quite good protuberant but nontender, bowel sounds are present. GENITAL/RECTAL/BREAST:  Exam was not done and not indicated. EXTREMITIES:  Lower extremities show minimal edema, no calf tenderness, pulses are  intact but quite decreased bilaterally. NEUROLOGIC:  The patient is somewhat confused, but can answer some questions appropriately.  He moves all 4 extremities.  IMPRESSION: 1. Acute respiratory failure that I suspect is primarily secondary to     acute on chronic diastolic heart failure and complicated by a large     pericardial effusion.  The patient still had edema on his x-ray      on the 8th of this month, and continues to have crackles on exam     that are quite prominent.  The question has been asked whether he     may have superimposed COPD, and I have no way of answering this     currently.  He apparently does have a smoking history, but is not     able to quantify this for  me.  He currently does not have true     wheezing on exam.  The only way to clarify this, will be pulmonary     function studies as an outpatient once he is more stable.  You can     certainly continue him on a bronchodilator regimen at discharge     until these can be done, but I would not maintain him on prednisone     with no active bronchospasm on exam today. 2. High pitched upper airway pseudo wheezing that I do not believe is     secondary to lower airway wheezing.  This is most commonly seen     associative with postnasal drip, reflux disease, ACE inhibitors,     and also in patients who are having respiratory difficulty and     trying to "auto-peep."  It can also be seen in patients with upper     airway abnormality such as vocal cord dysfunction and upper airway     growths.  I think it would be prudent to have an upper airway exam     since it is so prominent, by Otolaryngology.  SUGGESTIONS: 1. Wean off oxygen as tolerated. 2. Continue diuresis as blood pressure allows. 3. We would try and discontinue systemic steroids as quickly as     possible. 4. Can continue bronchodilators at discharge, but he will need     pulmonary function studies in order to clarify whether he really     has COPD or not.  I do not believe his current wheezing is coming     from his lower airway. 5. I would consider otolaryngology evaluation of his upper airway with     fiberoptic laryngoscopy.     Barbaraann Share, MD,FCCP     [facility]/MEDQ  D:  09/21/2013  T:  09/22/2013  Job:  [id number]

## 2013-09-22 NOTE — Progress Notes (Signed)
Pt is alert and oriented x2. (Self and time). Pt is complete care and a feeder. Pt is on 3L [place]. Pt has no complaints of pain. Will continue to monitor

## 2013-09-22 NOTE — Plan of Care (Signed)
Problem: Phase I Progression Outcomes Goal: EF % per last Echo/documented,Core Reminder form on chart Outcome: Completed/Met Date Met:  09/22/13 EF 60% from Echo 09/17/2013

## 2013-09-22 NOTE — Progress Notes (Signed)
Pt resting comfortable on bed, continues on 3L O2 [place], denies any SOB or pain at this time, VSS no distress noticed, pt still having some wheezing on ascultation. Pt on IV fluids as ordered.

## 2013-09-22 NOTE — Evaluation (Signed)
Clinical/Bedside Swallow Evaluation Patient Details  Name: Cameron Mendez MRN: [id number] Date of Birth: 02/27/1946  Today's Date: 09/22/2013 Time: 0900-0917 SLP Time Calculation (min): 17 min  Past Medical History:  Past Medical History  Diagnosis Date  . Anemia   . Diastolic dysfunction   . Retinitis pigmentosa     Blindness--has shunt --placed yrs ago in [place]..  . Arthritis     Gout  . Hypertension     dx--"long time"  . CKD (chronic kidney disease), stage IV     a. L upper extremity AV fistula created 07/2012.  [name] BPH (benign prostatic hyperplasia)   . Blind   . Gangrene of finger     a. 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  . Colon polyps   . Pericardial effusion     a. Noted on echo 10/2009. b. Again seen on echo 09/2013.   Past Surgical History:  Past Surgical History  Procedure Laterality Date  . Eye surgery    . Knee ligament reconstruction      left  . Av fistula placement  07/15/2012    Procedure: ARTERIOVENOUS (AV) FISTULA CREATION;  Surgeon: [name], MD;  Location: [facility] OR;  Service: Vascular;  Laterality: Left;  . Amputation  12/30/2012    Procedure: AMPUTATION DIGIT;  Surgeon: [name], MD;  Location: [facility];  Service: Orthopedics;  Laterality: Left;  LEFT SMALL FINGER AMPUTATION   HPI:  67 yo male adm to [facility] 09/17/13 with acute respiratory failure.  PMH + anemia, HTN, gout, acute on chronic heart failure with respiratory issues, CHF, pleural effusion, CKD, blindness.  CXR i10/8/14 Mild - moderate CHF.  Order for bedside swallow evaluation received.    Assessment / Plan / Recommendation Clinical Impression  Pt presents with signs/symptoms of moderate oral and possible pharyngeal dysphagia- although he denies any dysphagia symptoms.   Please note pt appears with possible CN deficits impacting LEFT trigeminal, facial, hypoglossal and vagus nerves but he denies h/o CVA.  (SLP did not locate brain imaging studies in chart).   Open mouth posture noted with standing secretions in oral cavity without pt awareness.    Decreased oral bolus cohesion with delayed transit noted across consistencies.  Pt also required verbal cues to swallow intermittently during evaluation most notably with solid residuals- ? Cognitive based deficits.    Frequent belching noted after liquid swallow without pt awareness, ? indication of esoph deficits.   No overt clinical indication of aspiration noted but subtle throat clearing apparent with liquids.   SLP can not rule out silent aspiration at bedside, especially given CN deficits.  Rec to modify meats to ground to mitigate aspiration risk- pt agreeable.  Educated pt to importance of slow rate of po and taking rest breaks during meals if short of breath for maximal airway protection.   Skilled intervention included educating pt to dietary recommendations and compensation strategies.  Pt with decreased ability to follow directions during testing.  SLP to follow briefly to educate pt/family to mitigation strategies and determine clinically if MBS is indicated.      Note rec per pulmonary to consider have laryngoscopy due to upper airway wheeze.      Aspiration Risk  Moderate    Diet Recommendation Regular;Thin liquid (GROUND MEATS)   Liquid Administration via: Cup;Straw Medication Administration:  (as tolerated) Supervision:  (set up assist) Compensations: Slow rate;Small sips/bites (rest break if short of breath) Postural Changes and/or Swallow Maneuvers: Seated upright 90  degrees;Upright 30-60 min after meal    Other  Recommendations Oral Care Recommendations: Oral care BID   Follow Up Recommendations   (TBD)    Frequency and Duration min 2x/week  2 weeks   Pertinent Vitals/Pain Afebrile, decreased    SLP Swallow Goals   See care plan  Swallow Study Prior Functional Status   denies h/o dysphagia but does admit to becoming dyspneic with po intake.    General Date of Onset:  09/22/13 HPI: 67 yo male adm to [facility] 09/17/13 with acute respiratory failure.  PMH + anemia, HTN, gout, acute on chronic heart failure with respiratory issues, CHF, pleural effusion, CKD, blindness.  CXR i10/8/14 Mild - moderate CHF.  Order for bedside swallow evaluation received.  Type of Study: Bedside swallow evaluation Diet Prior to this Study: Regular;Thin liquids Temperature Spikes Noted: No Respiratory Status: Nasal cannula (3 liters) History of Recent Intubation: No Behavior/Cognition: Requires cueing;Doesn't follow directions;Decreased sustained attention;Cooperative (pt appeared sleepy but did participate with delays) Oral Cavity - Dentition: Dentures, top (few lower dentition missing) Self-Feeding Abilities: Needs set up Patient Positioning: Upright in bed Baseline Vocal Quality: Clear Volitional Cough: Strong Volitional Swallow: Able to elicit    Oral/Motor/Sensory Function Overall Oral Motor/Sensory Function:  (open mouth posture with secretions retained, WEAKNESS) Labial ROM: Reduced left Labial Symmetry: Abnormal symmetry left Labial Strength: Reduced Lingual ROM: Reduced left Lingual Symmetry: Abnormal symmetry left Lingual Strength: Reduced Facial ROM: Reduced left Facial Strength: Reduced Facial Sensation: Reduced (LEFT) Velum: Impaired left (appearance of deviation to right upon phonation) Mandible: Impaired   Ice Chips Ice chips: Not tested   Thin Liquid Thin Liquid: Impaired Presentation: Straw Oral Phase Impairments: Reduced lingual movement/coordination Oral Phase Functional Implications: Prolonged oral transit;Oral holding Pharyngeal  Phase Impairments: Throat Clearing - Immediate;Decreased hyoid-laryngeal movement Other Comments: ? subtle throat clearing after swallow, pt required verbal cues to swallow-     Nectar Thick Nectar Thick Liquid: Not tested   Honey Thick Honey Thick Liquid: Not tested   Puree Puree: Impaired Presentation: Self Fed;Spoon Oral  Phase Functional Implications: Prolonged oral transit Pharyngeal Phase Impairments: Suspected delayed Swallow;Decreased hyoid-laryngeal movement   Solid   GO    Solid: Impaired Oral Phase Impairments: Reduced lingual movement/coordination;Impaired anterior to posterior transit Oral Phase Functional Implications: Oral residue;Oral holding (mastication) Pharyngeal Phase Impairments: Suspected delayed Swallow;Decreased hyoid-laryngeal movement Other Comments: pt required verbal cue to swallow residual from tongue       Donavan Burnet, MS [facility] SLP [phone]

## 2013-09-22 NOTE — Progress Notes (Signed)
    SUBJECTIVE: No acute distress.  Chronically ill appearing. No further chest tenderness   PHYSICAL EXAM Filed Vitals:   09/22/13 0828 09/22/13 1248 09/22/13 1343 09/22/13 1529  BP:   118/55   Pulse:   70   Temp:   98.3 F (36.8 C)   TempSrc:   Oral   Resp:   20   Height:      Weight:      SpO2: 92% 93% 98% 97%   General:  No acute distress  Lungs:  Diffuse wheezing mostly upper airway Heart:  RRR, no rub Abdomen:  Positive bowel sounds, no rebound no guarding Extremities:  No edema Chest :  No drainage  LABS:  Results for orders placed during the hospital encounter of 09/17/13 (from the past 24 hour(s))  RENAL FUNCTION PANEL     Status: Abnormal   Collection Time    09/22/13  8:45 AM      Result Value Range   Sodium 138  135 - 145 mEq/L   Potassium 4.2  3.5 - 5.1 mEq/L   Chloride 105  96 - 112 mEq/L   CO2 18 (*) 19 - 32 mEq/L   Glucose, Bld 123 (*) 70 - 99 mg/dL   BUN 657 (*) 6 - 23 mg/dL   Creatinine, Ser 8.46 (*) 0.50 - 1.35 mg/dL   Calcium 7.6 (*) 8.4 - 10.5 mg/dL   Phosphorus 6.0 (*) 2.3 - 4.6 mg/dL   Albumin 2.9 (*) 3.5 - 5.2 g/dL   GFR calc non Af Amer 12 (*) >90 mL/min   GFR calc Af Amer 14 (*) >90 mL/min    Intake/Output Summary (Last 24 hours) at 09/22/13 1839 Last data filed at 09/22/13 1839  Gross per 24 hour  Intake   1138 ml  Output    750 ml  Net    388 ml    ASSESSMENT AND PLAN:  Pericardial effusion:  S/P pericardial tap.  His breathing did not improve with diuresis of a couple of liters in the hospital or the percidardial tap. He is overall improved with pulmonary treatment however.  No further cardiac evaluation is planned.  He should have a follow up echocardiogram in 2 - 3 months.  He would need a window if this effusion reaccumulates.  I agree with no further diuresis.  We will sign off but let is know when he is discharged so that we can arrange a follow up echo.  (Send me a staff message through Epic.)   Rollene Rotunda 09/22/2013 6:39 PM

## 2013-09-22 NOTE — Progress Notes (Signed)
[facility] Progress Note   Cameron Mendez:[id number] DOB: 1946-07-30 DOA: 09/17/2013 PCP: [name], MD  Brief narrative: 67 year old male patient with known chronic kidney disease stage IV as well as diastolic congestive heart failure. He has chronic anemia and chronic thrombocytopenia dating back to 2013. He developed sudden onset of shortness of breath on the night of admission this was not associated with any chest pain or constitutional symptoms. In the ER he was profoundly hypoxic and required BiPAP. He was given IV Lasix. Chest x-ray was consistent with edema and CHF. Despite his poor kidney function patient endorsed he is making his usual amount of urine. Patient also has a known small to moderate pericardial effusion found on echocardiogram in 2010, larger this time and required pericardiocentesis (10/10). Patient do not use oxygen chronically at home and had undiagnosed of COPD; he is actively still smoking (no pulmonologist in outpatient setting)  Assessment/Plan:  Acute respiratory failure with hypoxia due to:   A) DIASTOLIC HEART FAILURE, ACUTE ON CHRONIC   B) Acute pulmonary edema   C) COPD exacerbation- no chronic O2 at home- do not have pulmonologist   D)bronchitis/bronchiectasis   E)pericardial effusion - wheezing- likely due to COPD since not significant improvement and Scr up with aggressive diuresis. -status post pericardiocentesis w/o significant change in his breathing status. -continue Coreg, supportive care and nebs -will continue pulmicort and continue prednisone; pulmonology has recommended quick tapering of steroids and continue diuresis; PFT's in outpatient setting and also ENT evaluation for upper airway disease. -renal function minimally improved with gentle hydration overnight. Will follow renal rec's; for now no HD. Will get CXR to evaluate pulmonary vascular congestion -cont Levaquin to cover for bronchitis/bronchiectasis -no overt fluid overload  on exam.  Essential hypertension, benign -well controlled.  -continue coreg.  -continue holding norvasc and now lasix   Large Pericardial effusion -progressively enlarging over years -now large but without tamponade physiology -- pericardiocentesis 10/10 with 740 cc straw colored fluid removed-currently blood tinged fluid.  - f/u on ANA and cytology - significant chest pain due to friction after fluid removed- unable to take NSAIDS due to ARF therefore started on Colchicine.  -Also being given Morphine and Vicodin PRN.   ARF on CKD (chronic kidney disease) stage 4-5, GFR 15-29 ml/min -further increase in BUN/Scr since admit- most likely due to overdiuresis, hypotension -Nephrology following; will follow rec's -Cr 4.55 today -GFR 14 -might ended needing HD; will see clinical response and evolution  Metabolic Acidosis - on bicarb tabs per renal   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  Anemia/Thrombocytopenia, chronic -stable -continue aranesp   BPH -cont Flomax  Hyperparathyroidism -continue calcitriol  Tobacco abuse:  -cessation counseling provided.   DVT prophylaxis: SCDs  Code Status: Full Family Communication: wife at bedside Disposition: to be determine  Consultants: Nephrology Cardiology  Procedures: TTE - Left ventricle: The cavity size was normal. Wall thickness was increased in a pattern of moderate LVH. The estimated ejection fraction was 60%. Wall motion was normal; there were no regional wall motion abnormalities. - Left atrium: The atrium was mildly dilated. - Pulmonary arteries: PA peak pressure: 54mm Hg (S). - Pericardium, extracardiac: A large pericardial effusion was identified circumferential to the heart. - Impressions: There is partial collapse of the IVC. There is no definite evidence of tamponade.   Antibiotics:  Levaquin 10/8 >>>  HPI/Subjective: Patient denies any pain and is breathing slightly better. No fever, no nausea, no  vomiting  Objective: Blood pressure 118/55, pulse 70, temperature  98.3 F (36.8 C), temperature source Oral, resp. rate 20, height 5\' 4"  (1.626 m), weight 67.5 kg (148 lb 13 oz), SpO2 97.00%.  Intake/Output Summary (Last 24 hours) at 09/22/13 1547 Last data filed at 09/22/13 1508  Gross per 24 hour  Intake   1138 ml  Output    500 ml  Net    638 ml     Exam: General: No acute respiratory distress Lungs:  bilateral  Exp wheezes, 3 L-100%; minimally crackles at bases and decrease BS Cardiovascular: Regular rate and rhythm without murmur gallop or rub normal S1 and S2, no peripheral edema or JVD. Abdomen: Nontender, nondistended, soft, bowel sounds positive, no rebound, no ascites, no appreciable mass Musculoskeletal: No significant cyanosis, clubbing or bilateral lower extremities Neurological: Alert and oriented x 3 although has difficulty with recollection of current medical history including that he last saw specific physicians, moves all extremities x 4 without focal neurological deficits, CN 2-12 intact  Scheduled Meds:  Scheduled Meds: . albuterol  2.5 mg Nebulization QID  . allopurinol  100 mg Oral BID  . aspirin EC  81 mg Oral Daily  . budesonide (PULMICORT) nebulizer solution  0.25 mg Nebulization BID  . calcitRIOL  0.25 mcg Oral Daily  . carvedilol  25 mg Oral BID WC  . colchicine  0.3 mg Oral Daily  . [START ON 09/26/2013] darbepoetin (ARANESP) injection - NON-DIALYSIS  100 mcg Subcutaneous Q Fri-1800  . ipratropium  0.5 mg Nebulization QID  . levofloxacin  500 mg Oral Q48H  . [START ON 09/23/2013] predniSONE  40 mg Oral Q breakfast  . sodium bicarbonate  1,300 mg Oral TID  . sodium chloride  3 mL Intravenous Q12H  . tamsulosin  0.4 mg Oral QPC supper   Continuous Infusions:    Data Reviewed: Basic Metabolic Panel:  Recent Labs Lab 09/17/13 0210 09/17/13 0747 09/18/13 0400 09/19/13 0405 09/20/13 0550 09/21/13 0530 09/22/13 0845  NA 144 142 144 142 140  139 138  K 4.2 4.3 4.5 4.2 4.4 4.2 4.2  CL 113* 111 110 107 107 106 105  CO2 16* 17* 17* 19 18* 16* 18*  GLUCOSE 136* 146* 127* 90 93 97 123*  BUN 56* 59* 72* 90* 100* 111* 107*  CREATININE 3.45* 3.62* 3.90* 4.47* 4.55* 4.79* 4.55*  CALCIUM 8.3* 8.8 8.5 8.5 8.1* 7.8* 7.6*  PHOS  --  2.9  --  4.7*  --   --  6.0*   Liver Function Tests:  Recent Labs Lab 09/17/13 0747 09/19/13 0405 09/22/13 0845  AST 11  --   --   ALT 9  --   --   ALKPHOS 79  --   --   BILITOT 0.4  --   --   PROT 8.4*  --   --   ALBUMIN 3.8 3.3* 2.9*   CBC:  Recent Labs Lab 09/17/13 0210 09/17/13 0747 09/18/13 0400 09/20/13 0550  WBC 5.7 4.3 7.2 7.0  NEUTROABS 4.9 3.9  --   --   HGB 9.3* 9.5* 8.4* 9.3*  HCT 29.5* 29.5* 25.8* 29.1*  MCV 95.2 93.9 93.5 93.3  PLT 64* 56* 60* 62*   Cardiac Enzymes:  Recent Labs Lab 09/17/13 0425 09/17/13 0747 09/17/13 1245 09/17/13 2044  TROPONINI <0.30 <0.30 <0.30 <0.30   BNP (last 3 results)  Recent Labs  09/17/13 0425 09/17/13 0747  PROBNP 7483.0* 8483.0*    Recent Results (from the past 240 hour(s))  MRSA PCR SCREENING  Status: None   Collection Time    09/17/13  5:11 AM      Result Value Range Status   MRSA by PCR NEGATIVE  NEGATIVE Final   Comment:            The GeneXpert MRSA Assay (FDA     approved for NASAL specimens     only), is one component of a     comprehensive MRSA colonization     surveillance program. It is not     intended to diagnose MRSA     infection nor to guide or     monitor treatment for     MRSA infections.  BODY FLUID CULTURE     Status: None   Collection Time    09/19/13 10:10 AM      Result Value Range Status   Specimen Description FLUID PERICARDIAL   Final   Special Requests FLUID   Final   Gram Stain     Final   Value: RARE WBC PRESENT, PREDOMINANTLY PMN     NO ORGANISMS SEEN     Performed at [facility]   Culture     Final   Value: NO GROWTH 3 DAYS     Performed at [facility]    Report Status 09/22/2013 FINAL   Final     Brandilyn Nanninga,MD [phone] 09/21/2013, 1:25 PM   If 7PM-7AM, please contact night-coverage [email or web address] Password [email or web address] 09/22/2013, 3:47 PM   LOS: 5 days

## 2013-09-22 NOTE — Progress Notes (Signed)
Subjective:  Has family in Denies any pain or dyspnea at present Lying comfortably in bed  Objective Vital signs in last 24 hours: Filed Vitals:   09/21/13 2040 09/21/13 2054 09/22/13 0501 09/22/13 0828  BP: 128/40  119/49   Pulse: 70  72   Temp: 97.5 F (36.4 C)  98.2 F (36.8 C)   TempSrc: Oral  Oral   Resp: 17  18   Height:      Weight:   67.5 kg (148 lb 13 oz)   SpO2: 99% 94% 100% 92%   Weight change: -1.1 kg (-2 lb 6.8 oz)  Intake/Output Summary (Last 24 hours) at 09/22/13 1145 Last data filed at 09/22/13 1114  Gross per 24 hour  Intake   1118 ml  Output    500 ml  Net    618 ml   Physical Exam:  Blood pressure 119/49, pulse 72, temperature 98.2 F (36.8 C), temperature source Oral, resp. rate 18, height 5\' 4"  (1.626 m), weight 67.5 kg (148 lb 13 oz), SpO2 92.00%. NAD, in bed, visitors (family) in  SKIN: pericardial drain site with large dressing RRR, S1S2 no S3S4 no rub heard PULM: b/l end exp wheezing, occ rhonchi Few base crackles ABD: s/nt/nd  EXT: no LE edema; LUA AVF +B/T  NERUO: no asterixis. nonfocal but seems slow  GU: condom cath some urine in bag   Weight trending 09/22/13 0501 67.5 kg (148 lb 13 oz)  09/21/13 0500 68.1 kg (150 lb 2.1 oz)  09/20/13 1504 68.6 kg (151 lb 3.8 oz)   09/20/13 0500 67.9 kg (149 lb 11.1 oz) 09/19/13 0500 67.4 kg (148 lb 9.4 oz)  09/18/13 0455 72.7 kg (160 lb 4.4 oz)    Recent Labs Lab 09/17/13 0210 09/17/13 0747 09/18/13 0400 09/19/13 0405 09/20/13 0550 09/21/13 0530 09/22/13 0845  NA 144 142 144 142 140 139 138  K 4.2 4.3 4.5 4.2 4.4 4.2 4.2  CL 113* 111 110 107 107 106 105  CO2 16* 17* 17* 19 18* 16* 18*  GLUCOSE 136* 146* 127* 90 93 97 123*  BUN 56* 59* 72* 90* 100* 111* 107*  CREATININE 3.45* 3.62* 3.90* 4.47* 4.55* 4.79* 4.55*  CALCIUM 8.3* 8.8 8.5 8.5 8.1* 7.8* 7.6*  PHOS  --  2.9  --  4.7*  --   --  6.0*   Liver Function Tests:  Recent Labs Lab 09/17/13 0747 09/19/13 0405 09/22/13 0845  AST 11   --   --   ALT 9  --   --   ALKPHOS 79  --   --   BILITOT 0.4  --   --   PROT 8.4*  --   --   ALBUMIN 3.8 3.3* 2.9*    Recent Labs Lab 09/17/13 0210 09/17/13 0747 09/18/13 0400 09/20/13 0550  WBC 5.7 4.3 7.2 7.0  NEUTROABS 4.9 3.9  --   --   HGB 9.3* 9.5* 8.4* 9.3*  HCT 29.5* 29.5* 25.8* 29.1*  MCV 95.2 93.9 93.5 93.3  PLT 64* 56* 60* 62*    Recent Labs Lab 09/17/13 0425 09/17/13 0747 09/17/13 1245 09/17/13 2044  TROPONINI <0.30 <0.30 <0.30 <0.30    Recent Labs Lab 09/19/13 1517  IRON 60  TIBC 163*  FERRITIN 545*   Results for Cameron Mendez, Cameron Mendez (MRN [id number]) as of 09/22/2013 11:55  Ref. Range 09/19/2013 15:17  ANA Latest Range: NEGATIVE  NEGATIVE   Results for Cameron Mendez, Cameron Mendez (MRN [id number]) as of 09/22/2013 11:55  Ref.  Range 09/17/2013 07:47  TSH Latest Range: 0.350-4.500 uIU/mL 1.853   Studies/Results: US Renal  09/21/2013   CLINICAL DATA:  67 year old male with acute renal failure.  EXAM: RENAL/URINARY TRACT ULTRASOUND COMPLETE  COMPARISON:  05/16/2009 CT  FINDINGS: Right Kidney  Length: 9.2 cm. Upper limits normal renal echogenicity noted. A 1.3 cm lower pole cyst is present. There is no evidence of hydronephrosis, solid renal mass or definite renal calculi. .  Left Kidney  Length: 10 cm. Upper limits normal renal echogenicity noted. There is no evidence of hydronephrosis, solid renal mass or definite renal calculi.  Bladder  Appears normal for degree of bladder distention.  IMPRESSION: Upper limits normal renal echogenicity which may reflect medical renal disease.  No evidence of hydronephrosis.   Electronically Signed   By: Laveda Abbe M.D.   On: 09/21/2013 13:59   Medications:   . albuterol  2.5 mg Nebulization QID  . allopurinol  100 mg Oral BID  . aspirin EC  81 mg Oral Daily  . budesonide (PULMICORT) nebulizer solution  0.25 mg Nebulization BID  . calcitRIOL  0.25 mcg Oral Daily  . carvedilol  25 mg Oral BID WC  . colchicine  0.3 mg Oral Daily  .  darbepoetin (ARANESP) injection - DIALYSIS  100 mcg Subcutaneous Q Fri-HD  . ipratropium  0.5 mg Nebulization QID  . levofloxacin  500 mg Oral Q48H  . [START ON 09/23/2013] predniSONE  40 mg Oral Q breakfast  . sodium bicarbonate  1,300 mg Oral TID  . sodium chloride  3 mL Intravenous Q12H  . tamsulosin  0.4 mg Oral QPC supper   I  have reviewed scheduled and prn medications.  ASSESSMENT/RECOMMENDATIONS  78M CKD4 (Followed by Dr. Briant Cedar, with baseline creatinine mid 3s, has left upper AVF) admitted with SOB, likey acute diastolic CHF exacerbation, and large pericardial effusion (drained 10/10 ). Has AoCKD.   Assessment/Plan   1. AKI on CKD: Most likely hemodynamic with initial drainage of PC effusion and aggressive diuresis with about 5 kg weight loss.  BP ok, no LEE, and stable on 3L [place]. Lasix on hold now for 72 hours, IVF given for a short time yesterday, creatinine today is down just a little.  Still no LE edema, not overtly uremic. Volume status always difficult in diastolic heart failure pts.   He does have bibasilar crackles likely some atelectasis and perhaps some component of edema too. I would favor continuing to hold lasix given small decline in creatinine, no change in weight and no edema.  Check CXR. May need HD but feel don't need to pull that trigger just yet.   2. HTN/Vol: as above, cont to hold diuretics. BP ok. No LEE. Breathing markedly improved with SpO2 100% on 3L. Amlodipine is on hold.  3. Pericardial Effusion: Drain removed, ANA negative, TSH normal. Per cardiology.  4. 2HPTH: on calcitriol, Ca is 7.8, Phos 6 Add binder  5. Anemia: On aranesp. Follow. Appears Fe replete.  6. Wheezing: Smoker. ? COPD. On albuterol and ipratropium nebs standing. Pulm recommends d/c of steroids  7. Metabolic acidosis:  NaHCO3 ^ to 1300 TID 10/12. Follow.  8. L UA of AVF: appears potentially small but could attempt use if needed as above, +B/T.   Camille Bal, MD [facility] [phone] pager 09/22/2013, 11:45 AM

## 2013-09-23 LAB — RENAL FUNCTION PANEL
Albumin: 2.7 g/dL — ABNORMAL LOW (ref 3.5–5.2)
BUN: 110 mg/dL — ABNORMAL HIGH (ref 6–23)
Calcium: 8 mg/dL — ABNORMAL LOW (ref 8.4–10.5)
Chloride: 109 mEq/L (ref 96–112)
Creatinine, Ser: 4.48 mg/dL — ABNORMAL HIGH (ref 0.50–1.35)
GFR calc non Af Amer: 12 mL/min — ABNORMAL LOW (ref >90)
Glucose, Bld: 105 mg/dL — ABNORMAL HIGH (ref 70–99)
Phosphorus: 5.6 mg/dL — ABNORMAL HIGH (ref 2.3–4.6)
Potassium: 4.2 mEq/L (ref 3.5–5.1)

## 2013-09-23 MED ORDER — CARVEDILOL 12.5 MG PO TABS
12.5000 mg | ORAL_TABLET | Freq: Two times a day (BID) | ORAL | Status: DC
  Filled 2013-09-23 (×2): qty 1

## 2013-09-23 MED ORDER — CARVEDILOL 3.125 MG PO TABS
3.1250 mg | ORAL_TABLET | Freq: Two times a day (BID) | ORAL | Status: DC
  Administered 2013-09-23 – 2013-09-26 (×5): 3.125 mg via ORAL
  Filled 2013-09-23 (×8): qty 1

## 2013-09-23 MED ORDER — CALCIUM ACETATE 667 MG PO CAPS
667.0000 mg | ORAL_CAPSULE | Freq: Three times a day (TID) | ORAL | Status: DC
  Administered 2013-09-23 – 2013-09-26 (×10): 667 mg via ORAL
  Filled 2013-09-23 (×12): qty 1

## 2013-09-23 MED ORDER — PREDNISONE 20 MG PO TABS
30.0000 mg | ORAL_TABLET | Freq: Every day | ORAL | Status: DC
  Administered 2013-09-24 – 2013-09-25 (×2): 30 mg via ORAL
  Filled 2013-09-23 (×3): qty 1

## 2013-09-23 MED ORDER — DOXERCALCIFEROL 4 MCG/2ML IV SOLN
1.0000 ug | INTRAVENOUS | Status: DC
  Administered 2013-09-23 – 2013-09-25 (×2): 1 ug via INTRAVENOUS
  Filled 2013-09-23 (×2): qty 2

## 2013-09-23 MED ORDER — DOXERCALCIFEROL 4 MCG/2ML IV SOLN
INTRAVENOUS | Status: AC
  Administered 2013-09-23: 1 ug via INTRAVENOUS
  Filled 2013-09-23: qty 2

## 2013-09-23 NOTE — Procedures (Addendum)
First HD.  AVF very small proximally. Venous site had to be cannulated a second time.  BFR 150 with VP160.  Hopefully will improve with use but will have to be careful about advancing blood flow. 2K bath, 1 liter goal   Plan 2 1/2 hour TMT today.  CLIP process started.

## 2013-09-23 NOTE — Progress Notes (Addendum)
Speech Language Pathology Treatment: Dysphagia  Patient Details Name: LESLEY GALENTINE MRN: [id number] DOB: 11-24-1946 Today's Date: 09/23/2013 Time: 4132-4401 SLP Time Calculation (min): 16 min  Assessment / Plan / Recommendation Clinical Impression  SLP follow up re: swallowing evaluation/function.  Note intake documented as 50% with pt afebrile and lungs decreased.  CXR 10/13 results indicating persistent vascular congestion with pt poor inspiration.  Pt lying flat in bed upon SLP entrance to room, SLP advised pt to stay upright for at least 30 minutes after eating due to concerns for symptoms of possible esophageal issues c/b frequent belching during po.    Observed pt to consume water via straw, delayed swallow followed by intermittent throat clearing.  Pt is impulsive and takes large sips, but he did respond to maximum verbal/tactile cues to smaller amounts.  Given he requires full assist due to weakness and blindness and can be instructed on swallow precautions with assist, recommend continue diet with STRICT aspiration precautions.   Pt with fewer retained oral secretions and decreased open mouth posture today than yesterday - he indicated he felt better today than previously.    SLP skilled intervention included reinforcement of swallow strategies to mitigate risk using teach back with pt.  Pt able to articulate strategies but implementation is limited.  SLP to follow up briefly for pt/family education.    If MD concerned re: risk of overt silent aspiration, rec consider MBS, otherwise continue strategies with precautions.     HPI  see previous note   Pertinent Vitals Decreased, afebrile  SLP Plan    ? SNF may be indicated due to pt requiring total assistance for eating   Recommendations Diet recommendations: Regular;Thin liquid (ground meats d/t dentition and for energy conservation) Liquids provided via: Cup;Straw Medication Administration: Whole meds with liquid Supervision:  Staff to assist with self feeding Compensations: Slow rate;Small sips/bites (rest break if dyspneic) Postural Changes and/or Swallow Maneuvers: Seated upright 90 degrees;Upright 30-60 min after meal              Oral Care Recommendations: Oral care before and after PO Follow up Recommendations:  (TBD)    GO     Donavan Burnet, MS [facility] SLP [phone]

## 2013-09-23 NOTE — Progress Notes (Signed)
[facility] Progress Note   Cameron Mendez ZOX:[id number] DOB: 03/27/1946 DOA: 09/17/2013 PCP: [name], MD  Brief narrative: 67 year old male patient with known chronic kidney disease stage IV as well as diastolic congestive heart failure. He has chronic anemia and chronic thrombocytopenia dating back to 2013. He developed sudden onset of shortness of breath on the night of admission this was not associated with any chest pain or constitutional symptoms. In the ER he was profoundly hypoxic and required BiPAP. He was given IV Lasix. Chest x-ray was consistent with edema and CHF. Despite his poor kidney function patient endorsed he is making his usual amount of urine. Patient also has a known small to moderate pericardial effusion found on echocardiogram in 2010, larger this time and required pericardiocentesis (10/10). Patient do not use oxygen chronically at home and had undiagnosed of COPD; he is actively still smoking (no pulmonologist in outpatient setting). Patient with positive asterixis, feeling more short of breath and lethargic this morning. High concerns for patient to start becoming uremic; discussed with Dr. Eliott Nine who agree and plans is to proceed with HD initiation.  Assessment/Plan:  Acute respiratory failure with hypoxia due to:   A) DIASTOLIC HEART FAILURE, ACUTE ON CHRONIC   B) Acute pulmonary edema   C) COPD exacerbation- no chronic O2 at home- do not have pulmonologist   D)bronchitis/bronchiectasis   E)pericardial effusion - wheezing- likely due to COPD since not significant improvement and Scr up with aggressive diuresis. -status post pericardiocentesis w/o significant change in his breathing status. -continue Coreg, supportive care and nebs -will continue pulmicort and continue prednisone; pulmonology has recommended quick tapering of steroids and continue diuresis; PFT's in outpatient setting and also ENT evaluation for upper airway disease. -renal function  essentially unchanged; but patient is more lethargic, with positive asterixis and feeling more short of breath today. -Plan is to initiate hemodialysis and clipping process for outpatient treatment. -cont Levaquin (days #3 out of 5) to cover for bronchitis/bronchiectasis -no overt fluid overload on exam. -Chest x-ray on 09/22/2013 demonstrated persistent vasc congestion   Essential hypertension, benign -soft but stable -in order to provide room for HD will change coreg to 3.125 BID -norvasc and lasix discontinued.  Large Pericardial effusion -progressively enlarging over years -now large but without tamponade physiology -pericardiocentesis 10/10 with 740 cc straw colored fluid removed-currently blood tinged fluid.  - negative ANA; final cytology pending - significant chest pain due to friction after fluid removed- unable to take NSAIDS due to ARF therefore started on Colchicine.  -Also being given Morphine and Vicodin PRN.   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   DVT prophylaxis: SCDs  Code Status: Full Family Communication: wife at bedside Disposition: to be determine  Consultants: Nephrology Cardiology  Procedures: TTE - Left ventricle: The cavity size was normal. Wall thickness was increased in a pattern of moderate LVH. The estimated ejection fraction was 60%. Wall motion was normal; there were no regional wall motion abnormalities. - Left atrium: The  atrium was mildly dilated. - Pulmonary arteries: PA peak pressure: 54mm  Hg (S). - Pericardium, extracardiac: A large pericardial effusion was identified circumferential to the heart. - Impressions: There is partial collapse of the IVC. There is no definite evidence of tamponade.   Antibiotics:  Levaquin 10/8 >>>  HPI/Subjective: Patient more lethargic, reports is feeling a little SOB and has positive asterixis.  Objective: Blood pressure 117/49, pulse 63, temperature 98.3 F (36.8 C), temperature source Oral, resp. rate 18, height 5\' 4"  (1.626 m), weight 67.1 kg (147 lb 14.9 oz), SpO2 98.00%.  Intake/Output Summary (Last 24 hours) at 09/23/13 1420 Last data filed at 09/23/13 1310  Gross per 24 hour  Intake   1063 ml  Output    400 ml  Net    663 ml     Exam: General: no labor breathing, but patient reports feeling more SOB Lungs:  bilateral  Exp wheezes, 3 L-100%; minimally crackles at bases and decrease BS Cardiovascular: Regular rate and rhythm without murmur gallop or rub normal S1 and S2, no peripheral edema or JVD. Abdomen: Nontender, nondistended, soft, bowel sounds positive, no rebound, no ascites, no appreciable mass Musculoskeletal: No significant cyanosis, clubbing or bilateral lower extremities edema Neurological: Alert and oriented x 3, slightly more lethargic although has difficulty with recollection of current medical history including that he last saw specific physicians, moves all extremities x 4 without focal neurological deficits, CN 2-12 intact  Scheduled Meds:  Scheduled Meds: . albuterol  2.5 mg Nebulization QID  . allopurinol  100 mg Oral BID  . aspirin EC  81 mg Oral Daily  . budesonide (PULMICORT) nebulizer solution  0.25 mg Nebulization BID  . calcium acetate  667 mg Oral TID WC  . carvedilol  3.125 mg Oral BID WC  . colchicine  0.3 mg Oral Daily  . [START ON 09/26/2013] darbepoetin (ARANESP) injection - NON-DIALYSIS  100 mcg Subcutaneous Q  Fri-1800  . doxercalciferol  1 mcg Intravenous Q T,Th,Sa-HD  . ipratropium  0.5 mg Nebulization QID  . levofloxacin  500 mg Oral Q48H  . [START ON 09/24/2013] predniSONE  30 mg Oral Q breakfast  . sodium chloride  3 mL Intravenous Q12H  . tamsulosin  0.4 mg Oral QPC supper   Continuous Infusions:    Data Reviewed: Basic Metabolic Panel:  Recent Labs Lab 09/17/13 0210 09/17/13 0747  09/19/13 0405 09/20/13 0550 09/21/13 0530 09/22/13 0845 09/23/13 0435  NA 144 142  < > 142 140 139 138 143  K 4.2 4.3  < > 4.2 4.4 4.2 4.2 4.2  CL 113* 111  < > 107 107 106 105 109  CO2 16* 17*  < > 19 18* 16* 18* 18*  GLUCOSE 136* 146*  < > 90 93 97 123* 105*  BUN 56* 59*  < > 90* 100* 111* 107* 110*  CREATININE 3.45* 3.62*  < > 4.47* 4.55* 4.79* 4.55* 4.48*  CALCIUM 8.3* 8.8  < > 8.5 8.1* 7.8* 7.6* 8.0*  PHOS  --  2.9  --  4.7*  --   --  6.0* 5.6*  < > = values in this interval not displayed. Liver Function Tests:  Recent Labs Lab 09/17/13 0747 09/19/13 0405 09/22/13 0845 09/23/13 0435  AST 11  --   --   --   ALT 9  --   --   --   ALKPHOS 79  --   --   --   BILITOT 0.4  --   --   --   PROT 8.4*  --   --   --  ALBUMIN 3.8 3.3* 2.9* 2.7*   CBC:  Recent Labs Lab 09/17/13 0210 09/17/13 0747 09/18/13 0400 09/20/13 0550  WBC 5.7 4.3 7.2 7.0  NEUTROABS 4.9 3.9  --   --   HGB 9.3* 9.5* 8.4* 9.3*  HCT 29.5* 29.5* 25.8* 29.1*  MCV 95.2 93.9 93.5 93.3  PLT 64* 56* 60* 62*   Cardiac Enzymes:  Recent Labs Lab 09/17/13 0425 09/17/13 0747 09/17/13 1245 09/17/13 2044  TROPONINI <0.30 <0.30 <0.30 <0.30   BNP (last 3 results)  Recent Labs  09/17/13 0425 09/17/13 0747  PROBNP 7483.0* 8483.0*    Recent Results (from the past 240 hour(s))  MRSA PCR SCREENING     Status: None   Collection Time    09/17/13  5:11 AM      Result Value Range Status   MRSA by PCR NEGATIVE  NEGATIVE Final   Comment:            The GeneXpert MRSA Assay (FDA     approved for NASAL specimens      only), is one component of a     comprehensive MRSA colonization     surveillance program. It is not     intended to diagnose MRSA     infection nor to guide or     monitor treatment for     MRSA infections.  BODY FLUID CULTURE     Status: None   Collection Time    09/19/13 10:10 AM      Result Value Range Status   Specimen Description FLUID PERICARDIAL   Final   Special Requests FLUID   Final   Gram Stain     Final   Value: RARE WBC PRESENT, PREDOMINANTLY PMN     NO ORGANISMS SEEN     Performed at [facility]   Culture     Final   Value: NO GROWTH 3 DAYS     Performed at [facility]   Report Status 09/22/2013 FINAL   Final     Quintavius Niebuhr,MD [phone] 09/21/2013, 1:25 PM   If 7PM-7AM, please contact night-coverage [email or web address] Password [email or web address] 09/23/2013, 2:20 PM   LOS: 6 days

## 2013-09-23 NOTE — Progress Notes (Addendum)
Subjective:  More lethargic and slow to answer today Says a little more short of breath  Objective Vital signs in last 24 hours: Filed Vitals:   09/22/13 2027 09/22/13 2157 09/23/13 0151 09/23/13 0623  BP:  119/51 117/43 111/54  Pulse:  73 70 64  Temp:  98.4 F (36.9 C) 98.2 F (36.8 C) 98.3 F (36.8 C)  TempSrc:  Oral Oral Oral  Resp:  18 16 18   Height:      Weight:    67.1 kg (147 lb 14.9 oz)  SpO2: 95% 98% 100% 99%   Weight change: -0.4 kg (-14.1 oz)  Intake/Output Summary (Last 24 hours) at 09/23/13 1013 Last data filed at 09/23/13 0900  Gross per 24 hour  Intake    943 ml  Output    550 ml  Net    393 ml   Physical Exam:  BP 111/54  Pulse 64  Temp(Src) 98.3 F (36.8 C) (Oral)  Resp 18  Ht 5\' 4"  (1.626 m)  Wt 67.1 kg (147 lb 14.9 oz)  BMI 25.38 kg/m2  SpO2 99%NAD, in bed SKIN: pericardial  site with large dressing RRR, S1S2 no S3S4 no rub heard PULM: b/l end exp wheezing, occ rhonchi Crackles at both bases ABD: s/nt/nd  EXT: no LE edema; LUA AVF +B/T  NERUO: + asterixus nonfocal but very slow slow  GU: condom cath some urine in bag   Weight trending 09/23/13 0623 67.1 kg  09/22/13 0501 67.5 kg (148 lb 13 oz)  09/21/13 0500 68.1 kg (150 lb 2.1 oz)  09/20/13 1504 68.6 kg (151 lb 3.8 oz)   09/20/13 0500 67.9 kg (149 lb 11.1 oz) 09/19/13 0500 67.4 kg (148 lb 9.4 oz)  09/18/13 0455 72.7 kg (160 lb 4.4 oz)    Recent Labs Lab 09/17/13 0210 09/17/13 0747 09/18/13 0400 09/19/13 0405 09/20/13 0550 09/21/13 0530 09/22/13 0845 09/23/13 0435  NA 144 142 144 142 140 139 138 143  K 4.2 4.3 4.5 4.2 4.4 4.2 4.2 4.2  CL 113* 111 110 107 107 106 105 109  CO2 16* 17* 17* 19 18* 16* 18* 18*  GLUCOSE 136* 146* 127* 90 93 97 123* 105*  BUN 56* 59* 72* 90* 100* 111* 107* 110*  CREATININE 3.45* 3.62* 3.90* 4.47* 4.55* 4.79* 4.55* 4.48*  CALCIUM 8.3* 8.8 8.5 8.5 8.1* 7.8* 7.6* 8.0*  PHOS  --  2.9  --  4.7*  --   --  6.0* 5.6*   Liver Function Tests:  Recent  Labs Lab 09/17/13 0747 09/19/13 0405 09/22/13 0845 09/23/13 0435  AST 11  --   --   --   ALT 9  --   --   --   ALKPHOS 79  --   --   --   BILITOT 0.4  --   --   --   PROT 8.4*  --   --   --   ALBUMIN 3.8 3.3* 2.9* 2.7*    Recent Labs Lab 09/17/13 0210 09/17/13 0747 09/18/13 0400 09/20/13 0550  WBC 5.7 4.3 7.2 7.0  NEUTROABS 4.9 3.9  --   --   HGB 9.3* 9.5* 8.4* 9.3*  HCT 29.5* 29.5* 25.8* 29.1*  MCV 95.2 93.9 93.5 93.3  PLT 64* 56* 60* 62*    Recent Labs Lab 09/17/13 0425 09/17/13 0747 09/17/13 1245 09/17/13 2044  TROPONINI <0.30 <0.30 <0.30 <0.30    Recent Labs Lab 09/19/13 1517  IRON 60  TIBC 163*  FERRITIN 545*  Results for AMIIR, HECKARD (MRN [id number]) as of 09/22/2013 11:55  Ref. Range 09/19/2013 15:17  ANA Latest Range: NEGATIVE  NEGATIVE   Results for TAISHAWN, SMALDONE (MRN [id number]) as of 09/22/2013 11:55  Ref. Range 09/17/2013 07:47  TSH Latest Range: 0.350-4.500 uIU/mL 1.853   Studies/Results: Dg Chest 2 View  09/22/2013   CLINICAL DATA:  Status post pericardial effusion drainage  EXAM: CHEST  2 VIEW  COMPARISON:  09/17/2013  FINDINGS: The cardiac shadow remains enlarged but is decreased from the prior exam. Some mild vascular congestion remains. The overall inspiratory effort is poor with crowding of the vascular markings. No focal confluent infiltrate is seen. A ventriculostomy shunt catheter is again noted.  IMPRESSION: Persistent vascular congestion accentuated by poor inspiratory effort.   Electronically Signed   By: Alcide Clever M.D.   On: 09/22/2013 15:35   US Renal  09/21/2013   CLINICAL DATA:  67 year old male with acute renal failure.  EXAM: RENAL/URINARY TRACT ULTRASOUND COMPLETE  COMPARISON:  05/16/2009 CT  FINDINGS: Right Kidney  Length: 9.2 cm. Upper limits normal renal echogenicity noted. A 1.3 cm lower pole cyst is present. There is no evidence of hydronephrosis, solid renal mass or definite renal calculi. .  Left Kidney  Length:  10 cm. Upper limits normal renal echogenicity noted. There is no evidence of hydronephrosis, solid renal mass or definite renal calculi.  Bladder  Appears normal for degree of bladder distention.  IMPRESSION: Upper limits normal renal echogenicity which may reflect medical renal disease.  No evidence of hydronephrosis.   Electronically Signed   By: Laveda Abbe M.D.   On: 09/21/2013 13:59   Medications:   . albuterol  2.5 mg Nebulization QID  . allopurinol  100 mg Oral BID  . aspirin EC  81 mg Oral Daily  . budesonide (PULMICORT) nebulizer solution  0.25 mg Nebulization BID  . calcitRIOL  0.25 mcg Oral Daily  . carvedilol  25 mg Oral BID WC  . colchicine  0.3 mg Oral Daily  . [START ON 09/26/2013] darbepoetin (ARANESP) injection - NON-DIALYSIS  100 mcg Subcutaneous Q Fri-1800  . ipratropium  0.5 mg Nebulization QID  . levofloxacin  500 mg Oral Q48H  . predniSONE  40 mg Oral Q breakfast  . sodium bicarbonate  1,300 mg Oral TID  . sodium chloride  3 mL Intravenous Q12H  . tamsulosin  0.4 mg Oral QPC supper   I  have reviewed scheduled and prn medications.  ASSESSMENT/RECOMMENDATIONS  67 BM with baseline CKD4 (followed by Dr. Briant Cedar, with baseline creatinine mid 3s, has left upper AVF) admitted with SOB, likely acute diastolic CHF exacerbation, and large pericardial effusion (drained 10/10 ). AKI on CKD with development of uremic symptoms, now ESRD   Assessment/Plan   1. 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  More lethargic, now with asterixus.  Feel we should go ahead with HD as he was quite borderline to start with.  Will need to lower BP meds to allow for ultrafiltration with HD. Start CLIP process.  AVF should be usable.  17 gauge needles.  Short slow TMT today Will start CLIP process  2. HTN/Vol: as above, start HD, lower BP meds 3. Pericardial Effusion: Drain removed, ANA  negative, TSH normal. Per cardiology.  4. 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 Change calcitriol to hectorol  5. Anemia: On aranesp. Follow. Appears Fe replete.  6. Wheezing: Smoker. ? COPD. On albuterol and ipratropium nebs standing. Pulm recommends d/c of steroids  7. Metabolic acidosis:  Stop sodium bicarb with initiation of HD 8. L UA of AVF: appears potentially small but could attempt use if needed as above, +B/T.   Camille Bal, MD [facility] [phone] pager 09/23/2013, 10:13 AM

## 2013-09-23 NOTE — Progress Notes (Signed)
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 He is on 3 L [place] of oxygen and is maximum assist with feeding and toileting, he is incontinent. During the shift his heart increased to 134 bpm non-sustained while sleeping in bed. VS were taken and documented. Pt.was asymptomatic. Paged and notified Mid-level provider on call with [facility].

## 2013-09-24 DIAGNOSIS — N19 Unspecified kidney failure: Secondary | ICD-10-CM

## 2013-09-24 LAB — RENAL FUNCTION PANEL
BUN: 73 mg/dL — ABNORMAL HIGH (ref 6–23)
CO2: 24 mEq/L (ref 19–32)
Calcium: 8.4 mg/dL (ref 8.4–10.5)
Chloride: 109 mEq/L (ref 96–112)
Creatinine, Ser: 3.4 mg/dL — ABNORMAL HIGH (ref 0.50–1.35)
Glucose, Bld: 95 mg/dL (ref 70–99)
Sodium: 146 mEq/L — ABNORMAL HIGH (ref 135–145)

## 2013-09-24 MED ORDER — NEPRO/CARBSTEADY PO LIQD
237.0000 mL | ORAL | Status: DC
  Administered 2013-09-24 – 2013-09-26 (×2): 237 mL via ORAL
  Filled 2013-09-24 (×4): qty 237

## 2013-09-24 NOTE — Progress Notes (Signed)
Subjective:  Much more awake and alert today Had first dialysis yesterday and tolerated well with 1 liter off Denies SOB and O2 off this AM  Objective Vital signs in last 24 hours: Filed Vitals:   09/23/13 1642 09/23/13 2000 09/23/13 2100 09/24/13 0556  BP: 139/70  123/47 125/42  Pulse: 67  68 63  Temp: 98.1 F (36.7 C)  98.2 F (36.8 C) 98.3 F (36.8 C)  TempSrc: Oral  Oral Oral  Resp: 13  18 17   Height:      Weight:    67.1 kg (147 lb 14.9 oz)  SpO2: 99% 98% 97% 95%   Weight change: 0.3 kg (10.6 oz)  Intake/Output Summary (Last 24 hours) at 09/24/13 0953 Last data filed at 09/24/13 0856  Gross per 24 hour  Intake    843 ml  Output   1450 ml  Net   -607 ml   Physical Exam:  BP 125/42  Pulse 63  Temp(Src) 98.3 F (36.8 C) (Oral)  Resp 17  Ht 5\' 4"  (1.626 m)  Wt 67.1 kg (147 lb 14.9 oz)  BMI 25.38 kg/m2  SpO2 95%NAD, in bed  RRR, S1S2 no S3S4 no rub heard PULM: No wheezing today  Few base crackles right ABD: s/nt/nd  EXT: no LE edema; LUA AVF +B/T with bandaid still on from yesterday's treatment NEURO: much more animated    Weight trending 09/23/13 0623 67.1 kg  09/22/13 0501 67.5 kg   09/21/13 0500 68.1 kg  09/20/13 1504 68.6 kg    09/20/13 0500 67.9 kg  09/19/13 0500 67.4 kg   09/18/13 0455 72.7 kg     Recent Labs Lab 09/18/13 0400 09/19/13 0405 09/20/13 0550 09/21/13 0530 09/22/13 0845 09/23/13 0435 09/24/13 0607  NA 144 142 140 139 138 143 146*  K 4.5 4.2 4.4 4.2 4.2 4.2 4.2  CL 110 107 107 106 105 109 109  CO2 17* 19 18* 16* 18* 18* 24  GLUCOSE 127* 90 93 97 123* 105* 95  BUN 72* 90* 100* 111* 107* 110* 73*  CREATININE 3.90* 4.47* 4.55* 4.79* 4.55* 4.48* 3.40*  CALCIUM 8.5 8.5 8.1* 7.8* 7.6* 8.0* 8.4  PHOS  --  4.7*  --   --  6.0* 5.6* 4.5    Recent Labs Lab 09/22/13 0845 09/23/13 0435 09/24/13 0607  ALBUMIN 2.9* 2.7* 2.9*    Recent Labs Lab 09/18/13 0400 09/20/13 0550  WBC 7.2 7.0  HGB 8.4* 9.3*  HCT 25.8* 29.1*  MCV 93.5  93.3  PLT 60* 62*    Recent Labs Lab 09/17/13 1245 09/17/13 2044  TROPONINI <0.30 <0.30    Recent Labs Lab 09/19/13 1517  IRON 60  TIBC 163*  FERRITIN 545*   Results for KOL, CONSUEGRA (MRN [id number]) as of 09/22/2013 11:55  Ref. Range 09/19/2013 15:17  ANA Latest Range: NEGATIVE  NEGATIVE   Results for SHAHIR, KAREN (MRN [id number]) as of 09/22/2013 11:55  Ref. Range 09/17/2013 07:47  TSH Latest Range: 0.350-4.500 uIU/mL 1.853   Studies/Results: Dg Chest 2 View  09/22/2013   CLINICAL DATA:  Status post pericardial effusion drainage  EXAM: CHEST  2 VIEW  COMPARISON:  09/17/2013  FINDINGS: The cardiac shadow remains enlarged but is decreased from the prior exam. Some mild vascular congestion remains. The overall inspiratory effort is poor with crowding of the vascular markings. No focal confluent infiltrate is seen. A ventriculostomy shunt catheter is again noted.  IMPRESSION: Persistent vascular congestion accentuated by poor inspiratory effort.  Electronically Signed   By: Alcide Clever M.D.   On: 09/22/2013 15:35   Medications:   . albuterol  2.5 mg Nebulization QID  . allopurinol  100 mg Oral BID  . aspirin EC  81 mg Oral Daily  . budesonide (PULMICORT) nebulizer solution  0.25 mg Nebulization BID  . calcium acetate  667 mg Oral TID WC  . carvedilol  3.125 mg Oral BID WC  . colchicine  0.3 mg Oral Daily  . [START ON 09/26/2013] darbepoetin (ARANESP) injection - NON-DIALYSIS  100 mcg Subcutaneous Q Fri-1800  . doxercalciferol  1 mcg Intravenous Q T,Th,Sa-HD  . ipratropium  0.5 mg Nebulization QID  . levofloxacin  500 mg Oral Q48H  . predniSONE  30 mg Oral Q breakfast  . sodium chloride  3 mL Intravenous Q12H  . tamsulosin  0.4 mg Oral QPC supper   I  have reviewed scheduled and prn medications.  ASSESSMENT/RECOMMENDATIONS  67 BM with baseline CKD4 (followed by Dr. Briant Cedar, with baseline creatinine mid 3s, has left upper AVF) admitted with SOB, likely acute  diastolic CHF exacerbation, and large pericardial effusion (drained 10/10 ). AKI on CKD with development of uremic symptoms, now ESRD   Assessment/Plan   1. AKI on CKD4 -  new ESRD:  Started HD yesterday with marked improvement in level of alertness, breathing well today.  Started CLIP process.  AVF small up top but was OK at BFR of 200 (ultimately) using 17 gauge needles. Plan for second treatment tomorrow. 2. HTN/Vol: as above, started HD, lowered BP meds 3. Pericardial Effusion: Drain removed, ANA negative, TSH normal. Per cardiology.  4. 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 Change calcitriol to hectorol  5. Anemia: On aranesp. Follow. Appears Fe replete.  6. Wheezing: Smoker. ? COPD. On albuterol and ipratropium nebs standing. Pulm recommends d/c of steroids - being tapered. No wheezing this AM 7. Metabolic acidosis:  Stopped sodium bicarb with initiation of HD 8. L UA of AVF: appears potentially small but could attempt use if needed as above, +B/T.  9. Nutrition - low alb - start nepro  Camille Bal, MD [facility] [phone] pager 09/24/2013, 9:53 AM

## 2013-09-24 NOTE — Progress Notes (Signed)
[facility] Progress Note   EDELMIRO INNOCENT ZOX:[id number] DOB: 24-Sep-1946 DOA: 09/17/2013 PCP: [name], MD  Brief narrative: 67 year old male patient with known chronic kidney disease stage IV as well as diastolic congestive heart failure. He has chronic anemia and chronic thrombocytopenia dating back to 2013. He developed sudden onset of shortness of breath on the night of admission this was not associated with any chest pain or constitutional symptoms. In the ER he was profoundly hypoxic and required BiPAP. He was given IV Lasix. Chest x-ray was consistent with edema and CHF. Despite his poor kidney function patient endorsed he is making his usual amount of urine. Patient also has a known small to moderate pericardial effusion found on echocardiogram in 2010, larger this time and required pericardiocentesis (10/10). Patient do not use oxygen chronically at home and had undiagnosed of COPD; he is actively still smoking (no pulmonologist in outpatient setting). Patient with positive asterixis, feeling more short of breath and lethargic this morning. High concerns for patient to start becoming uremic; discussed with Dr. Eliott Nine who agree and plans is to proceed with HD initiation.  Assessment/Plan:  Acute respiratory failure with hypoxia due DIASTOLIC HEART FAILURE, ACUTE ON CHRONIC/ Acute pulmonary edema/ COPD exacerbation- no chronic O2 at home- do not have pulmonologist/bronchitis/bronchiectasis/pericardial effusion: - will continue pulmicort and continue prednisone; pulmonology has recommended quick tapering of steroids and continue diuresis; PFT's in outpatient setting and al so ENT evaluation for upper airway disease. -  wheezing improved. - status post pericardiocentesis w/o significant change in his breathing status. - continue Coreg, supportive care and nebs - cont Levaquin (days #3 out of 5) to cover for bronchitis/bronchiectasis - + JVD - Chest x-ray on 09/22/2013 demonstrated  persistent vasc congestion   ARF on CKD (chronic kidney disease) stage 4-5, GFR 15-29 ml/min - most likely due to overdiuresis, hypotension - Nephrology following; will follow rec's - Plan is to initiate hemodialysis and clipping process for outpatient treatment.  Essential hypertension, benign -soft but stable -in order to provide room for HD will change coreg to 3.125 BID  Large Pericardial effusion -pericardiocentesis 10/10 with 740 cc straw colored fluid removed-currently blood tinged fluid.  - negative ANA; final cytology pending - significant chest pain due to friction after fluid removed- unable to take NSAIDS due to ARF therefore started on Colchicine.  -Also being given Morphine and Vicodin PRN.   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   DVT prophylaxis: SCDs  Code Status: Full Family Communication: wife at bedside Disposition: to be determine  Consultants: Nephrology Cardiology  Procedures: TTE - Left ventricle: The cavity size was normal. Wall thickness was increased in a pattern of moderate LVH. The estimated ejection fraction was 60%. Wall motion was normal; there were no regional wall motion abnormalities. - Left atrium: The atrium was mildly dilated. - Pulmonary arteries: PA peak pressure: 54mm Hg (S). - Pericardium, extracardiac: A large pericardial effusion was identified circumferential to the heart. - Impressions: There is partial collapse of the IVC. There is no definite evidence of tamponade.   Antibiotics:  Levaquin 10/8 >>>10.16.2014  HPI/Subjective: Alert no complains. Relates his SOB is improved   Objective: Blood pressure 125/42, pulse 63, temperature 98.3 F (36.8 C), temperature source Oral,  resp. rate 17, height 5\' 4"  (1.626 m), weight 67.1  kg (147 lb 14.9 oz), SpO2 95.00%.  Intake/Output Summary (Last 24 hours) at 09/24/13 1311 Last data filed at 09/24/13 0856  Gross per 24 hour  Intake    723 ml  Output   1300 ml  Net   -577 ml     Exam: General: no labor breathing, but patient reports feeling more SOB Lungs:  minimally crackles at bases and decrease BS Cardiovascular: Regular rate and rhythm without murmur gallop or rub normal S1 and S2, no peripheral edema, + JVD. Abdomen: Nontender, nondistended, soft, bowel sounds positive, no rebound, no ascites, no appreciable mass Musculoskeletal: No significant cyanosis, clubbing or bilateral lower extremities edema Neurological: Alert and oriented x 3, s moves all extremities x 4 without focal neurological deficits, CN 2-12 intact  Scheduled Meds:  Scheduled Meds: . albuterol  2.5 mg Nebulization QID  . allopurinol  100 mg Oral BID  . aspirin EC  81 mg Oral Daily  . budesonide (PULMICORT) nebulizer solution  0.25 mg Nebulization BID  . calcium acetate  667 mg Oral TID WC  . carvedilol  3.125 mg Oral BID WC  . colchicine  0.3 mg Oral Daily  . [START ON 09/26/2013] darbepoetin (ARANESP) injection - NON-DIALYSIS  100 mcg Subcutaneous Q Fri-1800  . doxercalciferol  1 mcg Intravenous Q T,Th,Sa-HD  . feeding supplement (NEPRO CARB STEADY)  237 mL Oral Q24H  . ipratropium  0.5 mg Nebulization QID  . levofloxacin  500 mg Oral Q48H  . predniSONE  30 mg Oral Q breakfast  . sodium chloride  3 mL Intravenous Q12H  . tamsulosin  0.4 mg Oral QPC supper   Continuous Infusions:    Data Reviewed: Basic Metabolic Panel:  Recent Labs Lab 09/19/13 0405 09/20/13 0550 09/21/13 0530 09/22/13 0845 09/23/13 0435 09/24/13 0607  NA 142 140 139 138 143 146*  K 4.2 4.4 4.2 4.2 4.2 4.2  CL 107 107 106 105 109 109  CO2 19 18* 16* 18* 18* 24  GLUCOSE 90 93 97 123* 105* 95  BUN 90* 100* 111* 107* 110* 73*  CREATININE 4.47* 4.55* 4.79*  4.55* 4.48* 3.40*  CALCIUM 8.5 8.1* 7.8* 7.6* 8.0* 8.4  PHOS 4.7*  --   --  6.0* 5.6* 4.5   Liver Function Tests:  Recent Labs Lab 09/19/13 0405 09/22/13 0845 09/23/13 0435 09/24/13 0607  ALBUMIN 3.3* 2.9* 2.7* 2.9*   CBC:  Recent Labs Lab 09/18/13 0400 09/20/13 0550  WBC 7.2 7.0  HGB 8.4* 9.3*  HCT 25.8* 29.1*  MCV 93.5 93.3  PLT 60* 62*   Cardiac Enzymes:  Recent Labs Lab 09/17/13 2044  TROPONINI <0.30   BNP (last 3 results)  Recent Labs  09/17/13 0425 09/17/13 0747  PROBNP 7483.0* 8483.0*    Recent Results (from the past 240 hour(s))  MRSA PCR SCREENING     Status: None   Collection Time    09/17/13  5:11 AM      Result Value Range Status   MRSA by PCR NEGATIVE  NEGATIVE Final   Comment:            The GeneXpert MRSA Assay (FDA     approved for NASAL specimens     only), is one component of a     comprehensive MRSA colonization     surveillance program. It is not     intended to diagnose MRSA     infection nor to guide or     monitor treatment for  MRSA infections.  BODY FLUID CULTURE     Status: None   Collection Time    09/19/13 10:10 AM      Result Value Range Status   Specimen Description FLUID PERICARDIAL   Final   Special Requests FLUID   Final   Gram Stain     Final   Value: RARE WBC PRESENT, PREDOMINANTLY PMN     NO ORGANISMS SEEN     Performed at [facility]   Culture     Final   Value: NO GROWTH 3 DAYS     Performed at [facility]   Report Status 09/22/2013 FINAL   Final     MADERA,CARLOS,MD [phone] 09/21/2013, 1:25 PM   If 7PM-7AM, please contact night-coverage [email or web address] Password [email or web address] 09/24/2013, 1:11 PM   LOS: 7 days

## 2013-09-24 NOTE — Progress Notes (Signed)
Pt.A/Ox2 and had no c/o pain during the 7p-7a shift and no signs of distress. He he on oxygen at 1 L [place] and is maximum assist with ADL's.

## 2013-09-24 NOTE — Evaluation (Signed)
Physical Therapy Evaluation Patient Details Name: Cameron Mendez MRN: [id number] DOB: 1946/02/16 Today's Date: 09/24/2013 Time: 7829-5621 PT Time Calculation (min): 22 min  PT Assessment / Plan / Recommendation History of Present Illness  67 year old male patient with known chronic kidney disease stage IV as well as diastolic congestive heart failure. He has chronic anemia and chronic thrombocytopenia dating back to 2013. He developed sudden onset of shortness of breath on the night of admission this was not associated with any chest pain or constitutional symptoms  Clinical Impression  Pt functioning near baseline. Pt most limited by poor vision requiring assist for safe manipulation of RW in room/hallway. Suspect pt to do better in home environment due to pt being more familiar with own home.     PT Assessment  Patient needs continued PT services    Follow Up Recommendations  No PT follow up;Supervision/Assistance - 24 hour    Does the patient have the potential to tolerate intense rehabilitation      Barriers to Discharge Decreased caregiver support      Equipment Recommendations  None recommended by PT (pt has RW)    Recommendations for Other Services     Frequency Min 3X/week    Precautions / Restrictions Precautions Precautions: Fall Restrictions Weight Bearing Restrictions: No   Pertinent Vitals/Pain Denies pain      Mobility  Bed Mobility Bed Mobility: Supine to Sit;Sit to Supine Supine to Sit: 4: Min guard;With rails;HOB elevated Sit to Supine: 4: Min guard;With rail;HOB elevated Details for Bed Mobility Assistance: v/c's for direction due to poor vision Transfers Transfers: Sit to Stand;Stand to Sit Sit to Stand: 4: Min guard;With upper extremity assist;From bed Stand to Sit: 4: Min guard;With upper extremity assist;To bed Details for Transfer Assistance: verbal directional cues due to poor vision Ambulation/Gait Ambulation/Gait Assistance: 4: Min  guard Ambulation Distance (Feet): 100 Feet Assistive device: Rolling walker Ambulation/Gait Assistance Details: attempted L HHA to mimic cane however pt guarded and unsteady. used RW pt with increased stability. minA for directional purposes due to poor vision Gait Pattern: Step-through pattern;Decreased stride length Gait velocity: wfl Stairs: No    Exercises     PT Diagnosis: Difficulty walking  PT Problem List: Decreased strength;Decreased balance;Decreased activity tolerance PT Treatment Interventions: DME instruction;Gait training;Stair training;Functional mobility training;Therapeutic activities;Therapeutic exercise;Balance training     PT Goals(Current goals can be found in the care plan section) Acute Rehab PT Goals Patient Stated Goal: home PT Goal Formulation: With patient Time For Goal Achievement: 10/01/13 Potential to Achieve Goals: Good  Visit Information  Last PT Received On: 09/24/13 Assistance Needed: +1 History of Present Illness: 67 year old male patient with known chronic kidney disease stage IV as well as diastolic congestive heart failure. He has chronic anemia and chronic thrombocytopenia dating back to 2013. He developed sudden onset of shortness of breath on the night of admission this was not associated with any chest pain or constitutional symptoms       Prior Functioning  Home Living Family/patient expects to be discharged to:: Private residence Living Arrangements: Spouse/significant other Available Help at Discharge: Family;Available PRN/intermittently Type of Home: House Home Access: Stairs to enter Entrance Stairs-Number of Steps: 1 Home Layout: One level Home Equipment: Walker - 2 wheels;Cane - quad Prior Function Level of Independence: Independent Comments: wife does meal prep due to pt's vision deficits Communication Communication: 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) Dominant Hand: Right    Cognition  Cognition Arousal/Alertness:  Awake/alert Behavior During Therapy: WFL for tasks assessed/performed Overall  Cognitive Status: Within Functional Limits for tasks assessed    Extremity/Trunk Assessment Upper Extremity Assessment Upper Extremity Assessment: Generalized weakness Lower Extremity Assessment Lower Extremity Assessment: Generalized weakness Cervical / Trunk Assessment Cervical / Trunk Assessment: Normal   Balance    End of Session PT - End of Session Equipment Utilized During Treatment: Gait belt Activity Tolerance: Patient tolerated treatment well Patient left: in bed;with call bell/phone within reach Nurse Communication: Mobility status  GP     Marcene Brawn 09/24/2013, 4:07 PM  Lewis Shock, PT, DPT Pager #: [phone] Office #: [phone]

## 2013-09-25 DIAGNOSIS — I110 Hypertensive heart disease with heart failure: Secondary | ICD-10-CM

## 2013-09-25 LAB — RENAL FUNCTION PANEL
Albumin: 2.8 g/dL — ABNORMAL LOW (ref 3.5–5.2)
BUN: 75 mg/dL — ABNORMAL HIGH (ref 6–23)
CO2: 26 mEq/L (ref 19–32)
Chloride: 105 mEq/L (ref 96–112)
GFR calc Af Amer: 19 mL/min — ABNORMAL LOW (ref >90)
Glucose, Bld: 86 mg/dL (ref 70–99)
Potassium: 4 mEq/L (ref 3.5–5.1)
Sodium: 141 mEq/L (ref 135–145)

## 2013-09-25 LAB — CBC
HCT: 24.9 % — ABNORMAL LOW (ref 39.0–52.0)
Hemoglobin: 7.9 g/dL — ABNORMAL LOW (ref 13.0–17.0)
MCV: 92.9 fL (ref 78.0–100.0)
Platelets: 65 10*3/uL — ABNORMAL LOW (ref 150–400)
RBC: 2.68 MIL/uL — ABNORMAL LOW (ref 4.22–5.81)
WBC: 5.6 10*3/uL (ref 4.0–10.5)

## 2013-09-25 MED ORDER — DOXERCALCIFEROL 4 MCG/2ML IV SOLN
INTRAVENOUS | Status: AC
  Filled 2013-09-25: qty 2

## 2013-09-25 MED ORDER — DARBEPOETIN ALFA-POLYSORBATE 100 MCG/0.5ML IJ SOLN
100.0000 ug | INTRAMUSCULAR | Status: DC
  Administered 2013-09-25: 100 ug via INTRAVENOUS
  Filled 2013-09-25: qty 0.5

## 2013-09-25 MED ORDER — DARBEPOETIN ALFA-POLYSORBATE 100 MCG/0.5ML IJ SOLN
INTRAMUSCULAR | Status: AC
  Administered 2013-09-25: 100 ug via INTRAVENOUS
  Filled 2013-09-25: qty 0.5

## 2013-09-25 MED ORDER — PREDNISONE 20 MG PO TABS
20.0000 mg | ORAL_TABLET | Freq: Every day | ORAL | Status: DC
  Administered 2013-09-26: 20 mg via ORAL
  Filled 2013-09-25 (×2): qty 1

## 2013-09-25 NOTE — Progress Notes (Signed)
[facility] Progress Note   ELISON WORREL ZOX:[id number] DOB: 1946-04-17 DOA: 09/17/2013 PCP: [name], MD  Brief narrative: 67 year old male patient with known chronic kidney disease stage IV as well as diastolic congestive heart failure. He has chronic anemia and chronic thrombocytopenia dating back to 2013. He developed sudden onset of shortness of breath on the night of admission this was not associated with any chest pain or constitutional symptoms. In the ER he was profoundly hypoxic and required BiPAP. He was given IV Lasix. Chest x-ray was consistent with edema and CHF. Despite his poor kidney function patient endorsed he is making his usual amount of urine. Patient also has a known small to moderate pericardial effusion found on echocardiogram in 2010, larger this time and required pericardiocentesis (10/10). Patient do not use oxygen chronically at home and had undiagnosed of COPD; he is actively still smoking (no pulmonologist in outpatient setting). Patient with positive asterixis, feeling more short of breath and lethargic this morning. High concerns for patient to start becoming uremic; discussed with Dr. Eliott Nine who agree and plans is to proceed with HD initiation.  Assessment/Plan:  Acute respiratory failure with hypoxia due DIASTOLIC HEART FAILURE, ACUTE ON CHRONIC/ Acute pulmonary edema/ COPD exacerbation- no chronic O2 at home- do not have pulmonologist/bronchitis/bronchiectasis/pericardial effusion: - will tapering of steroids and continue diuresis;  - PFT's in outpatient setting and al so ENT evaluation for upper airway disease. - status post pericardiocentesis w/o significant change in his breathing status. - continue Coreg, supportive care and nebs - cont Levaquin (days 5 out of 5) to cover for bronchitis/bronchiectasis   ARF on CKD (chronic kidney disease) stage 4-5, GFR 15-29 ml/min - most likely due to overdiuresis, hypotension - Nephrology following; will  follow rec's - Plan is to initiate hemodialysis and clipping process for outpatient treatment.  Essential hypertension, benign -soft but stable -in order to provide room for HD will change coreg to 3.125 BID  Large Pericardial effusion -pericardiocentesis 10/10 with 740 cc straw colored fluid removed-currently blood tinged fluid.  - negative ANA; final cytology pending - significant chest pain due to friction after fluid removed- unable to take NSAIDS due to ARF therefore started on Colchicine.  -Also being given Morphine and Vicodin PRN.   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   DVT prophylaxis: SCDs  Code Status: Full Family Communication: wife at bedside Disposition: to be determine  Consultants: Nephrology Cardiology  Procedures: TTE - Left ventricle: The cavity size was normal. Wall thickness was increased in a pattern of moderate LVH. The estimated ejection fraction was 60%. Wall motion was normal; there were no regional wall motion abnormalities. - Left atrium: The atrium was mildly dilated. - Pulmonary arteries: PA peak pressure: 54mm Hg (S). - Pericardium, extracardiac: A large pericardial effusion was identified circumferential to the heart. - Impressions: There is partial collapse of the IVC. There is no definite evidence of tamponade.   Antibiotics:  Levaquin 10/8 >>>10.16.2014  HPI/Subjective: Alert no complains. Relates his SOB is improved   Objective: Blood pressure 140/56, pulse 63, temperature 98.1 F (36.7 C), temperature source Oral, resp. rate 18, height 5\' 4"  (1.626 m), weight 64.6 kg (142 lb 6.7 oz), SpO2 96.00%.  Intake/Output Summary (Last 24 hours) at 09/25/13 1335 Last data filed at 09/25/13  1115  Gross per 24 hour  Intake    540 ml  Output   1400 ml  Net   -860 ml     Exam: General: no labor breathing, but patient reports feeling more SOB Lungs:  minimally crackles at bases and decrease BS Cardiovascular: Regular rate and rhythm without murmur gallop or rub normal S1 and S2, no peripheral edema, + JVD. Abdomen: Nontender, nondistended, soft, bowel sounds positive, no rebound, no ascites, no appreciable mass Musculoskeletal: No significant cyanosis, clubbing or bilateral lower extremities edema Neurological: Alert and oriented x 3, s moves all extremities x 4 without focal neurological deficits, CN 2-12 intact  Scheduled Meds:  Scheduled Meds: . albuterol  2.5 mg Nebulization QID  . allopurinol  100 mg Oral BID  . aspirin EC  81 mg Oral Daily  . budesonide (PULMICORT) nebulizer solution  0.25 mg Nebulization BID  . calcium acetate  667 mg Oral TID WC  . carvedilol  3.125 mg Oral BID WC  . colchicine  0.3 mg Oral Daily  . darbepoetin (ARANESP) injection - DIALYSIS  100 mcg Intravenous Q Thu-HD  . doxercalciferol  1 mcg Intravenous Q T,Th,Sa-HD  . feeding supplement (NEPRO CARB STEADY)  237 mL Oral Q24H  . ipratropium  0.5 mg Nebulization QID  . [START ON 09/26/2013] predniSONE  20 mg Oral Q breakfast  . sodium chloride  3 mL Intravenous Q12H  . tamsulosin  0.4 mg Oral QPC supper   Continuous Infusions:    Data Reviewed: Basic Metabolic Panel:  Recent Labs Lab 09/19/13 0405  09/21/13 0530 09/22/13 0845 09/23/13 0435 09/24/13 0607 09/25/13 0530  NA 142  < > 139 138 143 146* 141  K 4.2  < > 4.2 4.2 4.2 4.2 4.0  CL 107  < > 106 105 109 109 105  CO2 19  < > 16* 18* 18* 24 26  GLUCOSE 90  < > 97 123* 105* 95 86  BUN 90*  < > 111* 107* 110* 73* 75*  CREATININE 4.47*  < > 4.79* 4.55* 4.48* 3.40* 3.48*  CALCIUM 8.5  < > 7.8* 7.6* 8.0* 8.4 8.4  PHOS 4.7*  --   --  6.0* 5.6* 4.5 3.9  < > = values in this interval not displayed. Liver Function Tests:  Recent  Labs Lab 09/19/13 0405 09/22/13 0845 09/23/13 0435 09/24/13 0607 09/25/13 0530  ALBUMIN 3.3* 2.9* 2.7* 2.9* 2.8*   CBC:  Recent Labs Lab 09/20/13 0550 09/25/13 0530  WBC 7.0 5.6  HGB 9.3* 7.9*  HCT 29.1* 24.9*  MCV 93.3 92.9  PLT 62* 65*   Cardiac Enzymes: No results found for this basename: CKTOTAL, CKMB, CKMBINDEX, TROPONINI,  in the last 168 hours BNP (last 3 results)  Recent Labs  09/17/13 0425 09/17/13 0747  PROBNP 7483.0* 8483.0*    Recent Results (from the past 240 hour(s))  MRSA PCR SCREENING     Status: None   Collection Time    09/17/13  5:11 AM      Result Value Range Status   MRSA by PCR NEGATIVE  NEGATIVE Final   Comment:            The GeneXpert MRSA Assay (FDA     approved for NASAL specimens     only), is one component of a     comprehensive MRSA colonization     surveillance program. It is not     intended to diagnose MRSA     infection nor to guide or  monitor treatment for     MRSA infections.  BODY FLUID CULTURE     Status: None   Collection Time    09/19/13 10:10 AM      Result Value Range Status   Specimen Description FLUID PERICARDIAL   Final   Special Requests FLUID   Final   Gram Stain     Final   Value: RARE WBC PRESENT, PREDOMINANTLY PMN     NO ORGANISMS SEEN     Performed at [facility]   Culture     Final   Value: NO GROWTH 3 DAYS     Performed at [facility]   Report Status 09/22/2013 FINAL   Final     MADERA,CARLOS,MD [phone] 09/21/2013, 1:25 PM   If 7PM-7AM, please contact night-coverage [email or web address] Password [email or web address] 09/25/2013, 1:35 PM   LOS: 8 days

## 2013-09-25 NOTE — Procedures (Signed)
Initiation HD #2. In process of cannulating AVF.  Plan 3 1/2 hours BFR 200 and hope that AVF will tolerate.  For Aranesp with HD today 100 mcg/cbd

## 2013-09-25 NOTE — Progress Notes (Signed)
Subjective:  Patient seen in dialysis Feels well this AM No complaints of CP or SOB Preparing for AVF cannulation  Objective Vital signs in last 24 hours: Filed Vitals:   09/24/13 1328 09/24/13 1626 09/24/13 2120 09/25/13 0703  BP: 126/51  134/42 130/58  Pulse: 67  64 62  Temp: 98 F (36.7 C)  98.4 F (36.9 C) 97.9 F (36.6 C)  TempSrc: Oral  Oral Oral  Resp: 18  16 18   Height:      Weight:    65.3 kg (143 lb 15.4 oz)  SpO2: 100% 98% 94% 97%   Weight change:   Intake/Output Summary (Last 24 hours) at 09/25/13 1610 Last data filed at 09/25/13 0610  Gross per 24 hour  Intake    660 ml  Output    300 ml  Net    360 ml   Physical Exam:  BP 130/58  Pulse 62  Temp(Src) 97.9 F (36.6 C) (Oral)  Resp 18  Ht 5\' 4"  (1.626 m)  Wt 65.3 kg (143 lb 15.4 oz)  BMI 24.7 kg/m2  SpO2 97%NAD, in bed In good spirits Regular rhythm S1S2 no S3S4 no rub  PULM: right basilar crackles ABD: s/nt/nd  EXT: no LE edema; LUA AVF +B/T with bandaid still on from Tuesday's treatment NEURO: much more animated.   Weight trending 09/25/13 0703 65.3 kg  09/23/13 0623 67.1 kg  09/22/13 0501 67.5 kg   09/21/13 0500 68.1 kg  09/20/13 1504 68.6 kg    09/20/13 0500 67.9 kg  09/19/13 0500 67.4 kg   09/18/13 0455 72.7 kg     Recent Labs Lab 09/19/13 0405 09/20/13 0550 09/21/13 0530 09/22/13 0845 09/23/13 0435 09/24/13 0607 09/25/13 0530  NA 142 140 139 138 143 146* 141  K 4.2 4.4 4.2 4.2 4.2 4.2 4.0  CL 107 107 106 105 109 109 105  CO2 19 18* 16* 18* 18* 24 26  GLUCOSE 90 93 97 123* 105* 95 86  BUN 90* 100* 111* 107* 110* 73* 75*  CREATININE 4.47* 4.55* 4.79* 4.55* 4.48* 3.40* 3.48*  CALCIUM 8.5 8.1* 7.8* 7.6* 8.0* 8.4 8.4  PHOS 4.7*  --   --  6.0* 5.6* 4.5 3.9    Recent Labs Lab 09/23/13 0435 09/24/13 0607 09/25/13 0530  ALBUMIN 2.7* 2.9* 2.8*    Recent Labs Lab 09/20/13 0550 09/25/13 0530  WBC 7.0 5.6  HGB 9.3* 7.9*  HCT 29.1* 24.9*  MCV 93.3 92.9  PLT 62* 65*    No results found for this basename: CKTOTAL, CKMB, CKMBINDEX, TROPONINI,  in the last 168 hours  Recent Labs Lab 09/19/13 1517  IRON 60  TIBC 163*  FERRITIN 545*   Results for CASY, BRUNETTO (MRN [id number]) as of 09/22/2013 11:55  Ref. Range 09/19/2013 15:17  ANA Latest Range: NEGATIVE  NEGATIVE   Results for GERVASE, COLBERG (MRN [id number]) as of 09/22/2013 11:55  Ref. Range 09/17/2013 07:47  TSH Latest Range: 0.350-4.500 uIU/mL 1.853   Studies/Results: No results found. Medications:   . albuterol  2.5 mg Nebulization QID  . allopurinol  100 mg Oral BID  . aspirin EC  81 mg Oral Daily  . budesonide (PULMICORT) nebulizer solution  0.25 mg Nebulization BID  . calcium acetate  667 mg Oral TID WC  . carvedilol  3.125 mg Oral BID WC  . colchicine  0.3 mg Oral Daily  . [START ON 09/26/2013] darbepoetin (ARANESP) injection - NON-DIALYSIS  100 mcg Subcutaneous Q Fri-1800  .  doxercalciferol  1 mcg Intravenous Q T,Th,Sa-HD  . feeding supplement (NEPRO CARB STEADY)  237 mL Oral Q24H  . ipratropium  0.5 mg Nebulization QID  . levofloxacin  500 mg Oral Q48H  . predniSONE  30 mg Oral Q breakfast  . sodium chloride  3 mL Intravenous Q12H  . tamsulosin  0.4 mg Oral QPC supper   I  have reviewed scheduled and prn medications.  ASSESSMENT/RECOMMENDATIONS  67 BM with baseline CKD4 (followed by Dr. Briant Cedar, with baseline creatinine mid 3s, has left upper AVF) admitted with SOB, likely acute diastolic CHF exacerbation, and large pericardial effusion (drained 10/10 ). AKI on CKD with development of uremic symptoms, now ESRD   Assessment/Plan   1. AKI on CKD4 -  new ESRD:  Started HD 10/14 with marked improvement in level of alertness, breathing well now.  Started CLIP process.  AVF small up top but was OK at BFR of 200 (ultimately) using 17 gauge needles. Plan for second treatment today 2. HTN/Vol: as above, started HD, lowered BP meds 3. Pericardial Effusion: Drain removed, ANA  negative, TSH normal. Per cardiology.  4. 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 Change calcitriol to hectorol  5. Anemia: On aranesp. Follow. Appears Fe replete. Change to QThurs Hb drop to 7.9 No overt bleeding site. 6. Wheezing: Smoker. ? COPD. On albuterol and ipratropium nebs standing. Pulm recommends d/c of steroids - being tapered by primary team. No wheezing now 7. Metabolic acidosis:  Resolved. Stopped sodium bicarb with initiation of HD 8. Nutrition - low alb - started nepro 9. Dispo - anticipate home once CLIP process completed?  Cameron Bal, MD [facility] [phone] pager 09/25/2013, 7:12 AM

## 2013-09-25 NOTE — Progress Notes (Signed)
Pt assisted prn to bedside commode. Denies any CP nor SOB.

## 2013-09-25 NOTE — Progress Notes (Signed)
Speech Language Pathology Treatment:    Patient Details Name: Cameron Mendez MRN: [id number] DOB: Apr 30, 1946 Today's Date: 09/25/2013 Time:  -     Assessment / Plan / Recommendation Clinical Impression  Pt. appears to be tolerating diet without s/s of aspiration.  Pt. required moderate cues to verbalize and follow previously instructed compensatory strategies, but did recall need to sit upright after meals.  Currently, pt.'s LS are clear and he remains afebrile.  Will d/c ST at this time, but recommend continued supervision and assist with meals to follow aspiration and reflux precautions.     HPI     Pertinent Vitals n/a  SLP Plan  Discharge SLP treatment due to (comment);All goals met    Recommendations Liquids provided via: Cup;Straw Medication Administration: Whole meds with liquid Supervision: Staff to assist with self feeding Compensations: Slow rate;Small sips/bites Postural Changes and/or Swallow Maneuvers: Seated upright 90 degrees;Upright 30-60 min after meal              Oral Care Recommendations: Oral care before and after PO Plan: Discharge SLP treatment due to (comment);All goals met    GO     Maryjo Rochester T 09/25/2013, 5:01 PM

## 2013-09-25 NOTE — Progress Notes (Signed)
PT Cancellation Note  Patient Details Name: Cameron Mendez MRN: [id number] DOB: 1946-03-18   Cancelled Treatment:    Reason Eval/Treat Not Completed: Other (comment) (Pt refused due to possibly going home. ).   [name],[name] 09/25/2013, 2:37 PM  [facility] [phone] (pager)

## 2013-09-25 NOTE — Progress Notes (Signed)
Pt wanted to know if he is going to be d/c today.   Dr. Geronimo Running informed and instructed pt is not going to be d/c'd today as he need a dialysis center after d/c.  Pt informed and wife at bedside also made aware.  Verbalized understanding.  Amanda Pea, Charity fundraiser.

## 2013-09-26 LAB — RENAL FUNCTION PANEL
CO2: 27 mEq/L (ref 19–32)
Chloride: 108 mEq/L (ref 96–112)
Creatinine, Ser: 2.84 mg/dL — ABNORMAL HIGH (ref 0.50–1.35)
GFR calc Af Amer: 25 mL/min — ABNORMAL LOW (ref >90)
GFR calc non Af Amer: 21 mL/min — ABNORMAL LOW (ref >90)
Glucose, Bld: 78 mg/dL (ref 70–99)
Sodium: 144 mEq/L (ref 135–145)

## 2013-09-26 LAB — CBC
Hemoglobin: 8.2 g/dL — ABNORMAL LOW (ref 13.0–17.0)
MCH: 29.4 pg (ref 26.0–34.0)
MCHC: 31.3 g/dL (ref 30.0–36.0)
MCV: 93.9 fL (ref 78.0–100.0)
RBC: 2.79 MIL/uL — ABNORMAL LOW (ref 4.22–5.81)
RDW: 16.5 % — ABNORMAL HIGH (ref 11.5–15.5)

## 2013-09-26 MED ORDER — DARBEPOETIN ALFA-POLYSORBATE 100 MCG/0.5ML IJ SOLN: 100.0000 ug | INTRAMUSCULAR | Status: DC

## 2013-09-26 MED ORDER — DOXERCALCIFEROL 4 MCG/2ML IV SOLN: 1.0000 ug | INTRAVENOUS | Status: DC

## 2013-09-26 MED ORDER — HEPARIN SODIUM (PORCINE) 1000 UNIT/ML IJ SOLN
1300.0000 [IU] | Freq: Once | INTRAMUSCULAR | Status: AC
  Administered 2013-09-26: 1300 [IU] via INTRAVENOUS

## 2013-09-26 MED ORDER — CALCIUM ACETATE 667 MG PO CAPS: 667.0000 mg | ORAL_CAPSULE | Freq: Three times a day (TID) | ORAL | Status: DC

## 2013-09-26 NOTE — Progress Notes (Signed)
CLIP office now says patient will go to [place] 2nd shift 10:30 AM.  Staff will discuss change with family, give directions.  We will send records to the kidney center./cbd

## 2013-09-26 NOTE — Progress Notes (Addendum)
Subjective:  Pt has had HD X2 and tolerated well Now has outpt spot at [facility] MWF Waiting on chair time Objective Vital signs in last 24 hours: Filed Vitals:   09/25/13 1226 09/25/13 1602 09/25/13 2027 09/26/13 0450  BP: 140/56  129/49 136/57  Pulse: 63  67 63  Temp: 98.1 F (36.7 C)  98.2 F (36.8 C) 98.3 F (36.8 C)  TempSrc: Oral  Oral Oral  Resp: 18  18 16   Height:      Weight:    64.365 kg (141 lb 14.4 oz)  SpO2: 96% 95% 96% 96%   Weight change:   Intake/Output Summary (Last 24 hours) at 09/26/13 0709 Last data filed at 09/26/13 0151  Gross per 24 hour  Intake    483 ml  Output   1525 ml  Net  -1042 ml   Physical Exam:  BP 136/57  Pulse 63  Temp(Src) 98.3 F (36.8 C) (Oral)  Resp 16  Ht 5\' 4"  (1.626 m)  Wt 64.365 kg (141 lb 14.4 oz)  BMI 24.34 kg/m2  SpO2 96%NAD, in bed In good spirits Regular rhythm S1S2 no S3S4 no rub  PULM: clear ABD: s/nt/nd  EXT: no LE edema; LUA AVF +B/T    Weight trending 09/25/13 1150 64.6 kg post HD 09/25/13 0710 65.7 kg pre HD 09/25/13 0703 65.3 kg  09/23/13 0623 67.1 kg  09/22/13 0501 67.5 kg   09/21/13 0500 68.1 kg  09/20/13 1504 68.6 kg    09/20/13 0500 67.9 kg  09/19/13 0500 67.4 kg   09/18/13 0455 72.7 kg     Recent Labs Lab 09/20/13 0550 09/21/13 0530 09/22/13 0845 09/23/13 0435 09/24/13 0607 09/25/13 0530 09/26/13 0555  NA 140 139 138 143 146* 141 144  K 4.4 4.2 4.2 4.2 4.2 4.0 4.0  CL 107 106 105 109 109 105 108  CO2 18* 16* 18* 18* 24 26 27   GLUCOSE 93 97 123* 105* 95 86 78  BUN 100* 111* 107* 110* 73* 75* 47*  CREATININE 4.55* 4.79* 4.55* 4.48* 3.40* 3.48* 2.84*  CALCIUM 8.1* 7.8* 7.6* 8.0* 8.4 8.4 8.2*  PHOS  --   --  6.0* 5.6* 4.5 3.9 3.0    Recent Labs Lab 09/24/13 0607 09/25/13 0530 09/26/13 0555  ALBUMIN 2.9* 2.8* 2.8*    Recent Labs Lab 09/20/13 0550 09/25/13 0530 09/26/13 0555  WBC 7.0 5.6 PENDING  HGB 9.3* 7.9* 8.2*  HCT 29.1* 24.9* 26.2*  MCV 93.3 92.9 93.9  PLT 62* 65* PENDING    Recent Labs Lab 09/19/13 1517  IRON 60  TIBC 163*  FERRITIN 545*   Results for Cameron Mendez, Cameron Mendez (MRN [id number]) as of 09/22/2013 11:55  Ref. Range 09/19/2013 15:17  ANA Latest Range: NEGATIVE  NEGATIVE   Results for Cameron Mendez, Cameron Mendez (MRN [id number]) as of 09/22/2013 11:55  Ref. Range 09/17/2013 07:47  TSH Latest Range: 0.350-4.500 uIU/mL 1.853   Studies/Results: No results found. Medications:   . albuterol  2.5 mg Nebulization QID  . allopurinol  100 mg Oral BID  . aspirin EC  81 mg Oral Daily  . budesonide (PULMICORT) nebulizer solution  0.25 mg Nebulization BID  . calcium acetate  667 mg Oral TID WC  . carvedilol  3.125 mg Oral BID WC  . colchicine  0.3 mg Oral Daily  . darbepoetin (ARANESP) injection - DIALYSIS  100 mcg Intravenous Q Thu-HD  . doxercalciferol  1 mcg Intravenous Q T,Th,Sa-HD  . feeding supplement (NEPRO CARB STEADY)  237  mL Oral Q24H  . ipratropium  0.5 mg Nebulization QID  . predniSONE  20 mg Oral Q breakfast  . sodium chloride  3 mL Intravenous Q12H  . tamsulosin  0.4 mg Oral QPC supper   I  have reviewed scheduled and prn medications.  ASSESSMENT/RECOMMENDATIONS  67 BM with baseline CKD4 (followed by Dr. Briant Cedar, with baseline creatinine mid 3s, has left upper AVF) admitted with SOB, likely acute diastolic CHF exacerbation, and large pericardial effusion (drained 10/10 ). AKI on CKD with development of uremic symptoms, now with new ESRD   Assessment/Plan   1. ESRD:  Started HD 10/14 with marked improvement in level of alertness, breathing well now.   Status of CLIP process: have MWF [facility] ([facility]esterday [facility] were told [facility])  - just waiting on chair time AVF small up top but has been OK at BFR of 200 (ultimately) using 17 gauge needles. Will have to slowly work BFR up as outpt Plan for abbreviated 3rd TMT today to get on schedule Begin 4 hour TMT time on Monday Once has his TMT and chair time, could be d/c from renal standpoint Will need to  make sure he has transportation to/from HD (blind) 2. HTN/Vol: as above, started HD, lowered BP meds 3. Pericardial Effusion: Drain removed, ANA negative, TSH normal. 4. 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 Changed calcitriol to hectorol  5. Anemia: On aranesp. Follow. Appears Fe replete.  6. Wheezing: Smoker. ? COPD. On albuterol and ipratropium nebs standing. Pulm recommends d/c of steroids - being tapered by primary team. No wheezing now 7. Metabolic acidosis:  Resolved. Stopped sodium bicarb with initiation of HD 8. Nutrition - low alb - started nepro 9. Dispo - anticipate home once CLIP process completed - possibly today  Camille Bal, MD [facility] [phone] pager 09/26/2013, 7:09 AM

## 2013-09-26 NOTE — Progress Notes (Signed)
The patient states that he is tired this morning.  He did not have any complaints of pain overnight or acute changes.

## 2013-09-26 NOTE — Progress Notes (Deleted)
Physician Discharge Summary  Cameron Mendez ZOX:[id number] DOB: Nov 14, 1946 DOA: 09/17/2013  PCP: [name], MD  Admit date: 09/17/2013 Discharge date: 09/26/2013  Time spent: 35 minutes  Recommendations for Outpatient Follow-up:  1. Follow up with renal as an outpatinet 2. HD Monday, wenesday and fridays.  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 male with known history of chronic kidney disease stage IV, congestive heart failure last EF measured in 2010 was 65-70%, chronic anemia, hypertension started developing sudden onset of shortness of breath last night. Patient did not have any chest pain productive cough fever chills. In the ER he was found to be hypoxic and placed on BiPAP. Patient was given IV Lasix and at this time has been admitted for acute pulmonary edema secondary to CHF. As per patient he is still making urine. Patient states he has been taking his medications as advised. Chest x-ray shows features consistent with CHF. On exam patient does have mild wheezing. Patient's previous 2-D echo done in 2010 did show large pericardial effusion.   Hospital Course:  Acute respiratory failure with hypoxia due DIASTOLIC HEART FAILURE, ACUTE ON CHRONIC/ Acute pulmonary edema/ COPD exacerbation- no chronic O2 at home- do not have pulmonologist/bronchitis/bronchiectasis/pericardial effusion:  - will tapering of  steroids and continue diuresis;  - PFT's in outpatient setting and al so ENT evaluation for upper airway disease.  - status post pericardiocentesis w/o significant change in his breathing status.  - continue Coreg, supportive care and nebs  - cont Levaquin (days 5 out of 5) to cover for bronchitis/bronchiectasis.  ESDR: - As he was overloaded, and no improvement with diuretics, renal was consulted and recommended start HD. - tolerate it. - Nephrology consulted -  hemodialysis intitiated and clipping process for outpatient treatment.   Essential hypertension, benign  -soft but stable  -in order to provide room for HD will change coreg to 3.125 BID.  Large Pericardial effusion  -pericardiocentesis 10/10 with 740 cc straw colored fluid removed-currently blood tinged fluid.  - negative ANA; final cytology pending  - significant chest pain due to friction after fluid removed- unable to take NSAIDS due to ARF therefore started on Colchicine.  -Also being given Morphine and Vicodin PRN.   Metabolic Acidosis  - On bicarb tabs per renal  - Initiated HD.  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  Anemia/Thrombocytopenia, chronic  -stable  -continue aranesp   BPH  -cont Flomax    Procedures:  CXR  Renal US  Echo  Consultations:  Cardiology  renal  Discharge Exam: Filed Vitals:   09/26/13 1015  BP: 137/43  Pulse: 61  Temp:   Resp: 13    General: A&O x3 Cardiovascular: RRR Respiratory: good air movement CTA B/L  Discharge Instructions      Discharge Orders   Future Orders Complete By Expires   Diet - low sodium heart healthy  As directed  Increase activity slowly  As directed        Medication List    STOP taking these medications       furosemide 80 MG tablet  Commonly known as:  LASIX      TAKE these medications       albuterol 108 (90 BASE) MCG/ACT inhaler  Commonly known as:  PROVENTIL HFA;VENTOLIN HFA  Inhale 2 puffs into the lungs every 6 (six)  hours as needed.     allopurinol 100 MG tablet  Commonly known as:  ZYLOPRIM  Take 100 mg by mouth 2 (two) times daily.     amLODipine 10 MG tablet  Commonly known as:  NORVASC  Take 10 mg by mouth daily.     aspirin 81 MG tablet  Take 81 mg by mouth daily.     calcitRIOL 0.25 MCG capsule  Commonly known as:  ROCALTROL  Take 0.25 mcg by mouth daily.     calcium acetate 667 MG capsule  Commonly known as:  PHOSLO  Take 1 capsule (667 mg total) by mouth 3 (three) times daily with meals.     carvedilol 25 MG tablet  Commonly known as:  COREG  Take 25 mg by mouth 2 (two) times daily with a meal.     colchicine 0.6 MG tablet  Take 0.6 mg by mouth daily as needed. For gout     darbepoetin 100 MCG/0.5ML Soln injection  Commonly known as:  ARANESP  Inject 0.5 mLs (100 mcg total) into the vein every Thursday with hemodialysis.     doxercalciferol 4 MCG/2ML injection  Commonly known as:  HECTOROL  Inject 0.5 mLs (1 mcg total) into the vein Every Tuesday,Thursday,and Saturday with dialysis.     HYDROcodone-acetaminophen 5-325 MG per tablet  Commonly known as:  NORCO  Take 1 tablet by mouth every 6 (six) hours as needed for pain.     silodosin 8 MG Caps capsule  Commonly known as:  RAPAFLO  Take 8 mg by mouth daily with breakfast.       Allergies  Allergen Reactions  . Ibuprofen       The results of significant diagnostics from this hospitalization (including imaging, microbiology, ancillary and laboratory) are listed below for reference.    Significant Diagnostic Studies: Dg Chest 2 View  09/22/2013   CLINICAL DATA:  Status post pericardial effusion drainage  EXAM: CHEST  2 VIEW  COMPARISON:  09/17/2013  FINDINGS: The cardiac shadow remains enlarged but is decreased from the prior exam. Some mild vascular congestion remains. The overall inspiratory effort is poor with crowding of the vascular markings. No focal confluent infiltrate is seen. A ventriculostomy shunt  catheter is again noted.  IMPRESSION: Persistent vascular congestion accentuated by poor inspiratory effort.   Electronically Signed   By: Alcide Clever M.D.   On: 09/22/2013 15:35   Dg Chest 2 View  09/17/2013   *RADIOLOGY REPORT*  Clinical Data: Short of breath, respiratory distress  Two-view chest x-ray  Comparison: Prior chest x-ray 05/08/2013  Findings: Stable marked enlargement of the cardiopericardial silhouette.  Interval increase in the pulmonary vascular congestion and bilateral interstitial and airspace opacities with slightly increased confluence in the right base.  Small left pleural effusion. Incompletely imaged ventriculoperitoneal shunt tubing appears unchanged.  IMPRESSION: Mild - moderate CHF.   Original Report Authenticated By: Malachy Moan, M.D.   US Renal  09/21/2013   CLINICAL DATA:  67 year old male with acute renal failure.  EXAM: RENAL/URINARY TRACT ULTRASOUND COMPLETE  COMPARISON:  05/16/2009 CT  FINDINGS: Right Kidney  Length: 9.2 cm. Upper limits normal renal echogenicity noted. A 1.3 cm lower pole cyst is present. There is no evidence of hydronephrosis, solid renal mass or definite renal calculi. .  Left Kidney  Length: 10 cm. Upper limits normal renal echogenicity noted. There is no evidence of hydronephrosis, solid renal mass or definite renal calculi.  Bladder  Appears normal for degree of bladder distention.  IMPRESSION: Upper limits normal renal echogenicity which may reflect medical renal disease.  No evidence of hydronephrosis.   Electronically Signed   By: Laveda Abbe M.D.   On: 09/21/2013 13:59    Microbiology: Recent Results (from the past 240 hour(s))  MRSA PCR SCREENING     Status: None   Collection Time    09/17/13  5:11 AM      Result Value Range Status   MRSA by PCR NEGATIVE  NEGATIVE Final   Comment:            The GeneXpert MRSA Assay (FDA     approved for NASAL specimens     only), is one component of a     comprehensive MRSA colonization      surveillance program. It is not     intended to diagnose MRSA     infection nor to guide or     monitor treatment for     MRSA infections.  BODY FLUID CULTURE     Status: None   Collection Time    09/19/13 10:10 AM      Result Value Range Status   Specimen Description FLUID PERICARDIAL   Final   Special Requests FLUID   Final   Gram Stain     Final   Value: RARE WBC PRESENT, PREDOMINANTLY PMN     NO ORGANISMS SEEN     Performed at [facility]   Culture     Final   Value: NO GROWTH 3 DAYS     Performed at [facility]   Report Status 09/22/2013 FINAL   Final     Labs: Basic Metabolic Panel:  Recent Labs Lab 09/22/13 0845 09/23/13 0435 09/24/13 0607 09/25/13 0530 09/26/13 0555  NA 138 143 146* 141 144  K 4.2 4.2 4.2 4.0 4.0  CL 105 109 109 105 108  CO2 18* 18* 24 26 27   GLUCOSE 123* 105* 95 86 78  BUN 107* 110* 73* 75* 47*  CREATININE 4.55* 4.48* 3.40* 3.48* 2.84*  CALCIUM 7.6* 8.0* 8.4 8.4 8.2*  PHOS 6.0* 5.6* 4.5 3.9 3.0   Liver Function Tests:  Recent Labs Lab 09/22/13 0845 09/23/13 0435 09/24/13 0607 09/25/13 0530 09/26/13 0555  ALBUMIN 2.9* 2.7* 2.9* 2.8* 2.8*   No results found for this basename: LIPASE, AMYLASE,  in the last 168 hours No results found for this basename: AMMONIA,  in the last 168 hours CBC:  Recent Labs Lab 09/20/13 0550 09/25/13 0530 09/26/13 0555  WBC 7.0 5.6 6.7  HGB 9.3* 7.9* 8.2*  HCT 29.1* 24.9* 26.2*  MCV 93.3 92.9 93.9  PLT 62* 65* 83*   Cardiac Enzymes: No results found for this basename: CKTOTAL, CKMB, CKMBINDEX, TROPONINI,  in the last 168 hours BNP: BNP (last 3 results)  Recent Labs  09/17/13 0425 09/17/13 0747  PROBNP 7483.0* 8483.0*   CBG: No results found for this basename: GLUCAP,  in the last 168 hours     Signed:  Marinda Elk  [facility] 09/26/2013, 10:44  AM     

## 2013-09-26 NOTE — Progress Notes (Signed)
Physical Therapy Treatment Patient Details Name: Cameron Mendez MRN: [id number] DOB: May 28, 1946 Today's Date: 09/26/2013 Time: 4098-1191 PT Time Calculation (min): 25 min  PT Assessment / Plan / Recommendation  History of Present Illness 67 year old male patient with known chronic kidney disease stage IV as well as diastolic congestive heart failure. He has chronic anemia and chronic thrombocytopenia dating back to 2013. He developed sudden onset of shortness of breath on the night of admission this was not associated with any chest pain or constitutional symptoms   PT Comments   Pt progressing well--appears primarily limited by lack of vision combined with not having his blind cane and being in an unfamiliar environment. He states he has no concerns over his mobility and feels ready to go home. Denied dizziness post-dialysis and NT into check BP which was normal.   Follow Up Recommendations  No PT follow up;Supervision/Assistance - 24 hour     Does the patient have the potential to tolerate intense rehabilitation     Barriers to Discharge        Equipment Recommendations  None recommended by PT    Recommendations for Other Services    Frequency Min 3X/week   Progress towards PT Goals Progress towards PT goals: Progressing toward goals  Plan Current plan remains appropriate    Precautions / Restrictions Precautions Precautions: Fall   Pertinent Vitals/Pain Denied pain or dizziness     Mobility  Bed Mobility Bed Mobility: Supine to Sit;Sitting - Scoot to [place] of Bed;Sit to Supine Supine to Sit: 6: Modified independent (Device/Increase time) Sitting - Scoot to Edge of Bed: 7: Independent Sit to Supine: 7: Independent Details for Bed Mobility Assistance: vc for direction of his pillow only Transfers Transfers: Sit to Stand;Stand to Sit Sit to Stand: 5: Supervision Stand to Sit: 4: Min guard Details for Transfer Assistance: supervision for safety; guarding assist as he  tries to find/align himself with the surface to sit upon Ambulation/Gait Ambulation/Gait Assistance: 4: Min guard Ambulation Distance (Feet): 120 Feet Assistive device: 1 person hand held assist Ambulation/Gait Assistance Details: Lt HHA; pt is used to blind cane (not straight cane) and denies problems with his balance PTA; slight unsteadiness to his Lt when turning to his Rt (? due to unfamiliar environment and lack of cane?) Gait Pattern: Step-through pattern;Decreased stride length Gait velocity: very slow; reports he walks faster with his blind cane    Exercises     PT Diagnosis:    PT Problem List:   PT Treatment Interventions:     PT Goals (current goals can now be found in the care plan section) Acute Rehab PT Goals Patient Stated Goal: home  Visit Information  Last PT Received On: 09/26/13 Assistance Needed: +1 History of Present Illness: 67 year old male patient with known chronic kidney disease stage IV as well as diastolic congestive heart failure. He has chronic anemia and chronic thrombocytopenia dating back to 2013. He developed sudden onset of shortness of breath on the night of admission this was not associated with any chest pain or constitutional symptoms    Subjective Data  Subjective: Reports he uses his blind cane at home. States he barely sees light/shadows Patient Stated Goal: home   Cognition  Cognition Arousal/Alertness: Awake/alert Behavior During Therapy: WFL for tasks assessed/performed Overall Cognitive Status: Within Functional Limits for tasks assessed    Balance     End of Session PT - End of Session Equipment Utilized During Treatment: Gait belt Activity Tolerance: Patient tolerated treatment well Patient  left: in bed;with call bell/phone within reach Nurse Communication: Mobility status   GP     Cameron Mendez 09/26/2013, 3:01 PM Pager [phone]

## 2013-09-26 NOTE — Progress Notes (Signed)
Patient transported to dialysis. Mickey Farber, RN

## 2013-09-27 NOTE — Discharge Summary (Signed)
Physician Discharge Summary  Cameron Mendez ZOX:[id number] DOB: Jan 11, 1946 DOA: 09/17/2013  PCP: [name], MD  Admit date: 09/17/2013 Discharge date: 09/27/2013  Time spent: 35 minutes  Recommendations for Outpatient Follow-up:  1. Follow up with renal as an outpatinet 2. HD Monday, wenesday and fridays.  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 male with known history of chronic kidney disease stage IV, congestive heart failure last EF measured in 2010 was 65-70%, chronic anemia, hypertension started developing sudden onset of shortness of breath last night. Patient did not have any chest pain productive cough fever chills. In the ER he was found to be hypoxic and placed on BiPAP. Patient was given IV Lasix and at this time has been admitted for acute pulmonary edema secondary to CHF. As per patient he is still making urine. Patient states he has been taking his medications as advised. Chest x-ray shows features consistent with CHF. On exam patient does have mild wheezing. Patient's previous 2-D echo done in 2010 did show large pericardial effusion.   Hospital Course:  Acute respiratory failure with hypoxia due DIASTOLIC HEART FAILURE, ACUTE ON CHRONIC/ Acute pulmonary edema/ COPD exacerbation- no chronic O2 at home- do not have pulmonologist/bronchitis/bronchiectasis/pericardial effusion:  - will tapering of  steroids and continue diuresis;  - PFT's in outpatient setting and al so ENT evaluation for upper airway disease.  - status post pericardiocentesis w/o significant change in his breathing status.  - continue Coreg, supportive care and nebs  - cont Levaquin (days 5 out of 5) to cover for bronchitis/bronchiectasis.  ESDR: - As he was overloaded, and no improvement with diuretics, renal was consulted and recommended start HD. - tolerate it. - Nephrology consulted -  hemodialysis intitiated and clipping process for outpatient treatment.   Essential hypertension, benign  -soft but stable  -in order to provide room for HD will change coreg to 3.125 BID.  Large Pericardial effusion  -pericardiocentesis 10/10 with 740 cc straw colored fluid removed-currently blood tinged fluid.  - negative ANA; final cytology pending  - significant chest pain due to friction after fluid removed- unable to take NSAIDS due to ARF therefore started on Colchicine.  -Also being given Morphine and Vicodin PRN.   Metabolic Acidosis  - On bicarb tabs per renal  - Initiated HD.  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  Anemia/Thrombocytopenia, chronic  -stable  -continue aranesp   BPH  -cont Flomax    Procedures:  CXR  Renal US  Echo  Consultations:  Cardiology  renal  Discharge Exam: Filed Vitals:   09/26/13 1449  BP: 130/47  Pulse: 57  Temp:   Resp: 18    General: A&O x3 Cardiovascular: RRR Respiratory: good air movement CTA B/L  Discharge Instructions      Discharge Orders   Future Orders Complete By Expires   Diet - low sodium heart healthy  As directed  Increase activity slowly  As directed        Medication List    STOP taking these medications       furosemide 80 MG tablet  Commonly known as:  LASIX      TAKE these medications       albuterol 108 (90 BASE) MCG/ACT inhaler  Commonly known as:  PROVENTIL HFA;VENTOLIN HFA  Inhale 2 puffs into the lungs every 6 (six)  hours as needed.     allopurinol 100 MG tablet  Commonly known as:  ZYLOPRIM  Take 100 mg by mouth 2 (two) times daily.     amLODipine 10 MG tablet  Commonly known as:  NORVASC  Take 10 mg by mouth daily.     aspirin 81 MG tablet  Take 81 mg by mouth daily.     calcitRIOL 0.25 MCG capsule  Commonly known as:  ROCALTROL  Take 0.25 mcg by mouth daily.     calcium acetate 667 MG capsule  Commonly known as:  PHOSLO  Take 1 capsule (667 mg total) by mouth 3 (three) times daily with meals.     carvedilol 25 MG tablet  Commonly known as:  COREG  Take 25 mg by mouth 2 (two) times daily with a meal.     colchicine 0.6 MG tablet  Take 0.6 mg by mouth daily as needed. For gout     darbepoetin 100 MCG/0.5ML Soln injection  Commonly known as:  ARANESP  Inject 0.5 mLs (100 mcg total) into the vein every Thursday with hemodialysis.     doxercalciferol 4 MCG/2ML injection  Commonly known as:  HECTOROL  Inject 0.5 mLs (1 mcg total) into the vein Every Tuesday,Thursday,and Saturday with dialysis.     HYDROcodone-acetaminophen 5-325 MG per tablet  Commonly known as:  NORCO  Take 1 tablet by mouth every 6 (six) hours as needed for pain.     silodosin 8 MG Caps capsule  Commonly known as:  RAPAFLO  Take 8 mg by mouth daily with breakfast.       Allergies  Allergen Reactions  . Ibuprofen       The results of significant diagnostics from this hospitalization (including imaging, microbiology, ancillary and laboratory) are listed below for reference.    Significant Diagnostic Studies: Dg Chest 2 View  09/22/2013   CLINICAL DATA:  Status post pericardial effusion drainage  EXAM: CHEST  2 VIEW  COMPARISON:  09/17/2013  FINDINGS: The cardiac shadow remains enlarged but is decreased from the prior exam. Some mild vascular congestion remains. The overall inspiratory effort is poor with crowding of the vascular markings. No focal confluent infiltrate is seen. A ventriculostomy shunt  catheter is again noted.  IMPRESSION: Persistent vascular congestion accentuated by poor inspiratory effort.   Electronically Signed   By: Alcide Clever M.D.   On: 09/22/2013 15:35   Dg Chest 2 View  09/17/2013   *RADIOLOGY REPORT*  Clinical Data: Short of breath, respiratory distress  Two-view chest x-ray  Comparison: Prior chest x-ray 05/08/2013  Findings: Stable marked enlargement of the cardiopericardial silhouette.  Interval increase in the pulmonary vascular congestion and bilateral interstitial and airspace opacities with slightly increased confluence in the right base.  Small left pleural effusion. Incompletely imaged ventriculoperitoneal shunt tubing appears unchanged.  IMPRESSION: Mild - moderate CHF.   Original Report Authenticated By: Malachy Moan, M.D.   US Renal  09/21/2013   CLINICAL DATA:  67 year old male with acute renal failure.  EXAM: RENAL/URINARY TRACT ULTRASOUND COMPLETE  COMPARISON:  05/16/2009 CT  FINDINGS: Right Kidney  Length: 9.2 cm. Upper limits normal renal echogenicity noted. A 1.3 cm lower pole cyst is present. There is no evidence of hydronephrosis, solid renal mass or definite renal calculi. .  Left Kidney  Length: 10 cm. Upper limits normal renal echogenicity noted. There is no evidence of hydronephrosis, solid renal mass or definite renal calculi.  Bladder  Appears normal for degree of bladder distention.  IMPRESSION: Upper limits normal renal echogenicity which may reflect medical renal disease.  No evidence of hydronephrosis.   Electronically Signed   By: Laveda Abbe M.D.   On: 09/21/2013 13:59    Microbiology: Recent Results (from the past 240 hour(s))  BODY FLUID CULTURE     Status: None   Collection Time    09/19/13 10:10 AM      Result Value Range Status   Specimen Description FLUID PERICARDIAL   Final   Special Requests FLUID   Final   Gram Stain     Final   Value: RARE WBC PRESENT, PREDOMINANTLY PMN     NO ORGANISMS SEEN     Performed at [facility]   Culture     Final   Value: NO GROWTH 3 DAYS     Performed at [facility]   Report Status 09/22/2013 FINAL   Final     Labs: Basic Metabolic Panel:  Recent Labs Lab 09/22/13 0845 09/23/13 0435 09/24/13 0607 09/25/13 0530 09/26/13 0555  NA 138 143 146* 141 144  K 4.2 4.2 4.2 4.0 4.0  CL 105 109 109 105 108  CO2 18* 18* 24 26 27   GLUCOSE 123* 105* 95 86 78  BUN 107* 110* 73* 75* 47*  CREATININE 4.55* 4.48* 3.40* 3.48* 2.84*  CALCIUM 7.6* 8.0* 8.4 8.4 8.2*  PHOS 6.0* 5.6* 4.5 3.9 3.0   Liver Function Tests:  Recent Labs Lab 09/22/13 0845 09/23/13 0435 09/24/13 0607 09/25/13 0530 09/26/13 0555  ALBUMIN 2.9* 2.7* 2.9* 2.8* 2.8*   No results found for this basename: LIPASE, AMYLASE,  in the last 168 hours No results found for this basename: AMMONIA,  in the last 168 hours CBC:  Recent Labs Lab 09/25/13 0530 09/26/13 0555  WBC 5.6 6.7  HGB 7.9* 8.2*  HCT 24.9* 26.2*  MCV 92.9 93.9  PLT 65* 83*   Cardiac Enzymes: No results found for this basename: CKTOTAL, CKMB, CKMBINDEX, TROPONINI,  in the last 168 hours BNP: BNP (last 3 results)  Recent Labs  09/17/13 0425 09/17/13 0747  PROBNP 7483.0* 8483.0*   CBG: No results found for this basename: GLUCAP,  in the last 168 hours     Signed:  Marinda Elk  [facility] 09/27/2013, 10:21 AM

## 2014-01-06 ENCOUNTER — Ambulatory Visit
Admission: RE | Admit: 2014-01-06 | Discharge: 2014-01-06 | Disposition: A | Discharge status: Home / Self Care | Payer: Medicare Other | Source: Ambulatory Visit | Attending: Family Medicine | Admitting: Family Medicine

## 2014-01-06 ENCOUNTER — Other Ambulatory Visit: Payer: Self-pay | Admitting: Family Medicine

## 2014-01-06 DIAGNOSIS — R52 Pain, unspecified: Secondary | ICD-10-CM

## 2014-04-27 ENCOUNTER — Encounter (HOSPITAL_COMMUNITY): Payer: Self-pay | Admitting: Emergency Medicine

## 2014-04-27 ENCOUNTER — Inpatient Hospital Stay (HOSPITAL_COMMUNITY)
Admission: EM | Admit: 2014-04-27 | Discharge: 2014-05-01 | DRG: 190 | Disposition: A | Discharge status: Home / Self Care | Payer: Medicare Other | Attending: Nephrology | Admitting: Nephrology

## 2014-04-27 ENCOUNTER — Emergency Department (HOSPITAL_COMMUNITY): Payer: Medicare Other

## 2014-04-27 DIAGNOSIS — I428 Other cardiomyopathies: Secondary | ICD-10-CM | POA: Diagnosis present

## 2014-04-27 DIAGNOSIS — H543 Unqualified visual loss, both eyes: Secondary | ICD-10-CM | POA: Diagnosis present

## 2014-04-27 DIAGNOSIS — N186 End stage renal disease: Secondary | ICD-10-CM

## 2014-04-27 DIAGNOSIS — E8770 Fluid overload, unspecified: Secondary | ICD-10-CM

## 2014-04-27 DIAGNOSIS — D631 Anemia in chronic kidney disease: Secondary | ICD-10-CM | POA: Diagnosis present

## 2014-04-27 DIAGNOSIS — Z7982 Long term (current) use of aspirin: Secondary | ICD-10-CM

## 2014-04-27 DIAGNOSIS — Z886 Allergy status to analgesic agent status: Secondary | ICD-10-CM

## 2014-04-27 DIAGNOSIS — H3552 Pigmentary retinal dystrophy: Secondary | ICD-10-CM | POA: Diagnosis present

## 2014-04-27 DIAGNOSIS — N039 Chronic nephritic syndrome with unspecified morphologic changes: Secondary | ICD-10-CM

## 2014-04-27 DIAGNOSIS — J069 Acute upper respiratory infection, unspecified: Secondary | ICD-10-CM | POA: Diagnosis present

## 2014-04-27 DIAGNOSIS — Z992 Dependence on renal dialysis: Secondary | ICD-10-CM

## 2014-04-27 DIAGNOSIS — R0603 Acute respiratory distress: Secondary | ICD-10-CM

## 2014-04-27 DIAGNOSIS — Z8041 Family history of malignant neoplasm of ovary: Secondary | ICD-10-CM

## 2014-04-27 DIAGNOSIS — M109 Gout, unspecified: Secondary | ICD-10-CM | POA: Diagnosis present

## 2014-04-27 DIAGNOSIS — F17200 Nicotine dependence, unspecified, uncomplicated: Secondary | ICD-10-CM | POA: Diagnosis present

## 2014-04-27 DIAGNOSIS — J45901 Unspecified asthma with (acute) exacerbation: Principal | ICD-10-CM

## 2014-04-27 DIAGNOSIS — H548 Legal blindness, as defined in USA: Secondary | ICD-10-CM | POA: Diagnosis present

## 2014-04-27 DIAGNOSIS — J441 Chronic obstructive pulmonary disease with (acute) exacerbation: Principal | ICD-10-CM | POA: Diagnosis present

## 2014-04-27 DIAGNOSIS — Z982 Presence of cerebrospinal fluid drainage device: Secondary | ICD-10-CM

## 2014-04-27 DIAGNOSIS — Z9889 Other specified postprocedural states: Secondary | ICD-10-CM | POA: Insufficient documentation

## 2014-04-27 DIAGNOSIS — J9600 Acute respiratory failure, unspecified whether with hypoxia or hypercapnia: Secondary | ICD-10-CM | POA: Diagnosis present

## 2014-04-27 DIAGNOSIS — I120 Hypertensive chronic kidney disease with stage 5 chronic kidney disease or end stage renal disease: Secondary | ICD-10-CM | POA: Diagnosis present

## 2014-04-27 DIAGNOSIS — N400 Enlarged prostate without lower urinary tract symptoms: Secondary | ICD-10-CM | POA: Diagnosis present

## 2014-04-27 DIAGNOSIS — N2581 Secondary hyperparathyroidism of renal origin: Secondary | ICD-10-CM | POA: Diagnosis present

## 2014-04-27 HISTORY — DX: Heart failure, unspecified: I50.9

## 2014-04-27 LAB — COMPREHENSIVE METABOLIC PANEL
ALT: 12 U/L (ref 0–53)
AST: 15 U/L (ref 0–37)
Albumin: 3.5 g/dL (ref 3.5–5.2)
Alkaline Phosphatase: 64 U/L (ref 39–117)
BUN: 31 mg/dL — ABNORMAL HIGH (ref 6–23)
CALCIUM: 9.2 mg/dL (ref 8.4–10.5)
CO2: 27 meq/L (ref 19–32)
Chloride: 102 mEq/L (ref 96–112)
Creatinine, Ser: 4.02 mg/dL — ABNORMAL HIGH (ref 0.50–1.35)
GFR calc Af Amer: 16 mL/min — ABNORMAL LOW (ref >90)
GFR calc non Af Amer: 14 mL/min — ABNORMAL LOW (ref >90)
Glucose, Bld: 100 mg/dL — ABNORMAL HIGH (ref 70–99)
Potassium: 3.8 mEq/L (ref 3.7–5.3)
Sodium: 143 mEq/L (ref 137–147)
TOTAL PROTEIN: 7.4 g/dL (ref 6.0–8.3)
Total Bilirubin: 0.4 mg/dL (ref 0.3–1.2)

## 2014-04-27 LAB — CBG MONITORING, ED: Glucose-Capillary: 102 mg/dL — ABNORMAL HIGH (ref 70–99)

## 2014-04-27 LAB — I-STAT VENOUS BLOOD GAS, ED
Acid-Base Excess: 3 mmol/L — ABNORMAL HIGH (ref 0.0–2.0)
Bicarbonate: 29.2 mEq/L — ABNORMAL HIGH (ref 20.0–24.0)
O2 Saturation: 71 %
PCO2 VEN: 50 mmHg (ref 45.0–50.0)
TCO2: 31 mmol/L (ref 0–100)
pH, Ven: 7.373 — ABNORMAL HIGH (ref 7.250–7.300)
pO2, Ven: 39 mmHg (ref 30.0–45.0)

## 2014-04-27 LAB — CBC WITH DIFFERENTIAL/PLATELET
BASOS PCT: 0 % (ref 0–1)
Basophils Absolute: 0 10*3/uL (ref 0.0–0.1)
EOS ABS: 0 10*3/uL (ref 0.0–0.7)
Eosinophils Relative: 0 % (ref 0–5)
HEMATOCRIT: 32.4 % — AB (ref 39.0–52.0)
Hemoglobin: 9.8 g/dL — ABNORMAL LOW (ref 13.0–17.0)
LYMPHS PCT: 17 % (ref 12–46)
Lymphs Abs: 0.7 10*3/uL (ref 0.7–4.0)
MCH: 30.2 pg (ref 26.0–34.0)
MCHC: 30.2 g/dL (ref 30.0–36.0)
MCV: 99.7 fL (ref 78.0–100.0)
MONO ABS: 0.3 10*3/uL (ref 0.1–1.0)
Monocytes Relative: 7 % (ref 3–12)
Neutro Abs: 3.1 10*3/uL (ref 1.7–7.7)
Neutrophils Relative %: 76 % (ref 43–77)
Platelets: 79 10*3/uL — ABNORMAL LOW (ref 150–400)
RBC: 3.25 MIL/uL — ABNORMAL LOW (ref 4.22–5.81)
RDW: 16.3 % — AB (ref 11.5–15.5)
WBC: 4.2 10*3/uL (ref 4.0–10.5)

## 2014-04-27 LAB — I-STAT CHEM 8, ED
BUN: 29 mg/dL — ABNORMAL HIGH (ref 6–23)
CREATININE: 4.1 mg/dL — AB (ref 0.50–1.35)
Calcium, Ion: 1.15 mmol/L (ref 1.13–1.30)
Chloride: 104 mEq/L (ref 96–112)
Glucose, Bld: 101 mg/dL — ABNORMAL HIGH (ref 70–99)
HEMATOCRIT: 33 % — AB (ref 39.0–52.0)
HEMOGLOBIN: 11.2 g/dL — AB (ref 13.0–17.0)
POTASSIUM: 3.8 meq/L (ref 3.7–5.3)
SODIUM: 144 meq/L (ref 137–147)
TCO2: 28 mmol/L (ref 0–100)

## 2014-04-27 LAB — PRO B NATRIURETIC PEPTIDE: Pro B Natriuretic peptide (BNP): 8130 pg/mL — ABNORMAL HIGH (ref 0–125)

## 2014-04-27 LAB — I-STAT TROPONIN, ED: Troponin i, poc: 0 ng/mL (ref 0.00–0.08)

## 2014-04-27 LAB — LACTIC ACID, PLASMA: Lactic Acid, Venous: 1 mmol/L (ref 0.5–2.2)

## 2014-04-27 LAB — HEPATITIS B SURFACE ANTIGEN: Hepatitis B Surface Ag: NEGATIVE

## 2014-04-27 MED ORDER — HEPARIN SODIUM (PORCINE) 1000 UNIT/ML DIALYSIS
4000.0000 [IU] | Freq: Once | INTRAMUSCULAR | Status: AC
  Administered 2014-04-28: 4000 [IU] via INTRAVENOUS_CENTRAL
  Filled 2014-04-27: qty 4

## 2014-04-27 MED ORDER — ONDANSETRON HCL 4 MG/2ML IJ SOLN: 4.0000 mg | Freq: Four times a day (QID) | INTRAMUSCULAR | Status: DC | PRN

## 2014-04-27 MED ORDER — PENTAFLUOROPROP-TETRAFLUOROETH EX AERO: 1.0000 "application " | INHALATION_SPRAY | CUTANEOUS | Status: DC | PRN

## 2014-04-27 MED ORDER — ALLOPURINOL 100 MG PO TABS
100.0000 mg | ORAL_TABLET | Freq: Every day | ORAL | Status: DC
  Administered 2014-04-27 – 2014-05-01 (×5): 100 mg via ORAL
  Filled 2014-04-27 (×5): qty 1

## 2014-04-27 MED ORDER — CALCIUM ACETATE 667 MG PO CAPS
667.0000 mg | ORAL_CAPSULE | Freq: Three times a day (TID) | ORAL | Status: DC
  Administered 2014-04-27 – 2014-05-01 (×8): 667 mg via ORAL
  Filled 2014-04-27 (×14): qty 1

## 2014-04-27 MED ORDER — FUROSEMIDE 10 MG/ML IJ SOLN
80.0000 mg | Freq: Once | INTRAMUSCULAR | Status: AC
  Administered 2014-04-27: 80 mg via INTRAVENOUS
  Filled 2014-04-27: qty 8

## 2014-04-27 MED ORDER — LIDOCAINE-PRILOCAINE 2.5-2.5 % EX CREA
1.0000 | TOPICAL_CREAM | CUTANEOUS | Status: DC | PRN
Start: 2014-04-27 — End: 2014-04-27

## 2014-04-27 MED ORDER — CARVEDILOL 25 MG PO TABS
25.0000 mg | ORAL_TABLET | Freq: Two times a day (BID) | ORAL | Status: DC
  Administered 2014-04-27 – 2014-04-30 (×6): 25 mg via ORAL
  Filled 2014-04-27 (×10): qty 1

## 2014-04-27 MED ORDER — SODIUM CHLORIDE 0.9 % IV SOLN: 100.0000 mL | INTRAVENOUS | Status: DC | PRN

## 2014-04-27 MED ORDER — HYDROCODONE-ACETAMINOPHEN 5-325 MG PO TABS: 1.0000 | ORAL_TABLET | ORAL | Status: DC | PRN

## 2014-04-27 MED ORDER — LIDOCAINE-PRILOCAINE 2.5-2.5 % EX CREA: 1.0000 "application " | TOPICAL_CREAM | CUTANEOUS | Status: DC | PRN

## 2014-04-27 MED ORDER — NEPRO/CARBSTEADY PO LIQD: 237.0000 mL | ORAL | Status: DC | PRN

## 2014-04-27 MED ORDER — ACETAMINOPHEN 325 MG PO TABS: 650.0000 mg | ORAL_TABLET | Freq: Four times a day (QID) | ORAL | Status: DC | PRN

## 2014-04-27 MED ORDER — HEPARIN SODIUM (PORCINE) 1000 UNIT/ML DIALYSIS
1000.0000 [IU] | INTRAMUSCULAR | Status: DC | PRN
  Filled 2014-04-27: qty 1

## 2014-04-27 MED ORDER — ASPIRIN EC 81 MG PO TBEC
81.0000 mg | DELAYED_RELEASE_TABLET | Freq: Every day | ORAL | Status: DC
  Administered 2014-04-27 – 2014-05-01 (×5): 81 mg via ORAL
  Filled 2014-04-27 (×5): qty 1

## 2014-04-27 MED ORDER — LIDOCAINE HCL (PF) 1 % IJ SOLN: 5.0000 mL | INTRAMUSCULAR | Status: DC | PRN

## 2014-04-27 MED ORDER — AMLODIPINE BESYLATE 10 MG PO TABS
10.0000 mg | ORAL_TABLET | Freq: Every day | ORAL | Status: DC
  Administered 2014-04-27 – 2014-05-01 (×5): 10 mg via ORAL
  Filled 2014-04-27 (×5): qty 1

## 2014-04-27 MED ORDER — ALTEPLASE 2 MG IJ SOLR
2.0000 mg | Freq: Once | INTRAMUSCULAR | Status: AC | PRN
  Filled 2014-04-27: qty 2

## 2014-04-27 MED ORDER — SODIUM CHLORIDE 0.9 % IJ SOLN
3.0000 mL | Freq: Two times a day (BID) | INTRAMUSCULAR | Status: DC
  Administered 2014-04-28 – 2014-04-30 (×6): 3 mL via INTRAVENOUS

## 2014-04-27 MED ORDER — HEPARIN SODIUM (PORCINE) 1000 UNIT/ML DIALYSIS: 1000.0000 [IU] | INTRAMUSCULAR | Status: DC | PRN

## 2014-04-27 MED ORDER — ONDANSETRON HCL 4 MG PO TABS: 4.0000 mg | ORAL_TABLET | Freq: Four times a day (QID) | ORAL | Status: DC | PRN

## 2014-04-27 MED ORDER — ALTEPLASE 2 MG IJ SOLR: 2.0000 mg | Freq: Once | INTRAMUSCULAR | Status: DC | PRN

## 2014-04-27 MED ORDER — ACETAMINOPHEN 650 MG RE SUPP: 650.0000 mg | Freq: Four times a day (QID) | RECTAL | Status: DC | PRN

## 2014-04-27 MED ORDER — HEPARIN SODIUM (PORCINE) 1000 UNIT/ML DIALYSIS: 4000.0000 [IU] | Freq: Once | INTRAMUSCULAR | Status: DC

## 2014-04-27 MED ORDER — ASPIRIN 81 MG PO TABS: 81.0000 mg | ORAL_TABLET | Freq: Every day | ORAL | Status: DC

## 2014-04-27 NOTE — ED Notes (Signed)
Dr Joneen Caraway into see pt and make arrangements for dialysis today soon

## 2014-04-27 NOTE — Progress Notes (Signed)
Pt admitted to room 6E 08 from dialysis.  Pt blind . Pt just removed from bi-pap prior to being transferred.  O2 sat was reported 99% on RA,  Upon arrival RA sat 89% from dialysis.  Placed pt on 2 lpm via [place].  Pt A&OX4.  Denies pain. Pt is blind. Total care.  Wife at bedside.

## 2014-04-27 NOTE — H&P (Signed)
Renal Service Consult Note [facility]  COBE Cameron Mendez 04/27/2014 Sol Blazing Requesting Physician:  Dr Ashok Cordia  Reason for Consult:  ESRD patient with acute SOB, resp distress HPI: The patient is a 68 y.o. year-old with hx of HTN, diast HF and ESRD presenting today with acute SOB and resp distress. His wife provides hx.  Pt had had a "cold" early last week with some cough and congestion.  Over the w/e he developed some DOE and today qas having difficulty breathing and was brought to ED.  Pt is blind and uses a walking stick.  He started HD in Oct 2014.    No fever, chills, sweats, no CP or HA, no diarreha. Vomited once this morning.   Chart review 2003-- colonoscopy w rectal polyps cauterized Oct 2010-- SOB , pulm edema, diast HF exacerbation, acute/CRF, HTN crisis, EG 70%. Chronic low plts. Legally blind, gout, DJD, anemia, ventriculostomy shunt. Diuresed w IV lasix w improvement. 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 Had diuresis, pericardiocentesis w/o improvement in SOB, abx for bronchitis. CKD IV > V and HD was initiated. Legal blindness, BPH on flomax. D/C'd to OP HD.     ROS  no jt pain or rash  no hemoptysis  Past Medical History  Past Medical History  Diagnosis Date  . Anemia   . Diastolic dysfunction   . Retinitis pigmentosa     Blindness--has shunt --placed yrs ago in [place]..  . Arthritis     Gout  . Hypertension     dx--"long time"  . CKD (chronic kidney disease), stage IV     a. L upper extremity AV fistula created 07/2012.  [name] BPH (benign prostatic hyperplasia)   . Blind   . Gangrene of finger     a. 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  . Colon polyps   . Pericardial effusion     a. Noted on echo 10/2009. b. Again seen on echo 09/2013.  [name] CHF (congestive heart failure)    Past Surgical History  Past Surgical History   Procedure Laterality Date  . Eye surgery    . Knee ligament reconstruction      left  . Av fistula placement  07/15/2012    Procedure: ARTERIOVENOUS (AV) FISTULA CREATION;  Surgeon: Rosetta Posner, MD;  Location: [place];  Service: Vascular;  Laterality: Left;  . Amputation  12/30/2012    Procedure: AMPUTATION DIGIT;  Surgeon: Tennis Must, MD;  Location: [place];  Service: Orthopedics;  Laterality: Left;  LEFT SMALL FINGER AMPUTATION   Family History  Family History  Problem Relation Age of Onset  . Cancer Mother     OVARIAN   Social History  reports that he has been smoking Cigarettes.  He has a 20 pack-year smoking history. He has never used smokeless tobacco. He reports that he does not drink alcohol or use illicit drugs. Allergies  Allergies  Allergen Reactions  . Ibuprofen Other (See Comments)    Kidney doctor does not want pt to take   Home medications Prior to Admission medications   Medication Sig Start Date End Date Taking? Authorizing Provider  albuterol (PROVENTIL HFA;VENTOLIN HFA) 108 (90 BASE) MCG/ACT inhaler Inhale 2 puffs into the lungs every 6 (six) hours as needed for wheezing or shortness of breath.    Yes Historical Provider, MD  allopurinol (  ZYLOPRIM) 100 MG tablet Take 100 mg by mouth daily.    Yes Historical Provider, MD  amLODipine (NORVASC) 10 MG tablet Take 10 mg by mouth daily.   Yes Historical Provider, MD  aspirin 81 MG tablet Take 81 mg by mouth daily.   Yes Historical Provider, MD  bisacodyl (DULCOLAX) 5 MG EC tablet Take 5 mg by mouth daily as needed for moderate constipation.   Yes Historical Provider, MD  calcium acetate (PHOSLO) 667 MG capsule Take 1 capsule (667 mg total) by mouth 3 (three) times daily with meals. 09/26/13  Yes [name], MD  carvedilol (COREG) 25 MG tablet Take 25 mg by mouth 2 (two) times daily with a meal.   Yes Historical Provider, MD  clotrimazole-betamethasone (LOTRISONE) cream Apply 1 application  topically 2 (two) times daily.   Yes Historical Provider, MD  colchicine 0.6 MG tablet Take 0.6 mg by mouth daily as needed. For gout   Yes Historical Provider, MD  diphenhydrAMINE (BENADRYL) 25 MG tablet Take 25-50 mg by mouth every 6 (six) hours as needed for allergies.   Yes Historical Provider, MD  HYDROcodone-acetaminophen (NORCO) 5-325 MG per tablet Take 1 tablet by mouth every 6 (six) hours as needed for pain. 12/30/12  Yes Tennis Must, MD   Liver Function Tests  Recent Labs Lab 04/27/14 0600  AST 15  ALT 12  ALKPHOS 64  BILITOT 0.4  PROT 7.4  ALBUMIN 3.5   No results found for this basename: LIPASE, AMYLASE,  in the last 168 hours CBC  Recent Labs Lab 04/27/14 0600 04/27/14 0611  WBC 4.2  --   NEUTROABS 3.1  --   HGB 9.8* 11.2*  HCT 32.4* 33.0*  MCV 99.7  --   PLT 79*  --    Basic Metabolic Panel  Recent Labs Lab 04/27/14 0600 04/27/14 0611  NA 143 144  K 3.8 3.8  CL 102 104  CO2 27  --   GLUCOSE 100* 101*  BUN 31* 29*  CREATININE 4.02* 4.10*  CALCIUM 9.2  --     Filed Vitals:   04/27/14 0530 04/27/14 0608 04/27/14 0630 04/27/14 0700  BP: 143/54 143/53 151/61 146/80  Pulse: 63 65 63 74  Temp:      TempSrc:      Resp: 19 21 18 20   Height:      Weight:      SpO2: 100% 100% 100% 100%   Exam: On bipap, opens eyes, not in distress now No rash, cyanosis or gangrene Sclera anicteric, throat clear +jvd Chest bilat insp and exp coarse wheezing w occ rales RRR no MRG heard Abd soft, firm, +BS, NTND, no ascites +bilat pitting pretibial edema 2+ No LE ulcers or gangrene, no joint effusion or deformity Neuro is blind ou, follows commands, good strength x 4 ext LUA AVF patent  CXR CM mild bilat hazy infiltrates w vasc congestion  Dialysis: [facility] 4h  67kg   2/2.25 [place]    Heparin 4000   LUA AVF Hectorol 2     Aranesp 200/wk on thurs     Venofer 50/wk   Assessment: 1 SOB / acute onset- likely pulm edema, he has signs of volume overload on  exam 2 ESRD on HD 3 HTN 4 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

## 2014-04-27 NOTE — ED Notes (Addendum)
Patient with increased shortness of breath for the last few days.  Patient does have history of CHF.  Patient with bilateral breath sounds of rales, rhonchi and wheezing.  Patient is blind, CAOx3.  Patient receiving neb en route to ED, which has helped.  Wife at bedside.  Patient is dialysis patient and goes on MWF.  Patient is due today at 11am.

## 2014-04-27 NOTE — ED Provider Notes (Addendum)
Pt in ED s/p workup by Dr Cheri Guppy - she states pt needs inpatient hd due to dyspnea/hypoxia.  [facility] called - discussed pt with Dr Jonnie Finner, who evaluated pt in ED, and has arrange HD last this morning, and he requests med service admit.  [facility] paged to admit/obs.   Subsequently, renal called back to state that they would do the admission.  [facility] informed.      Mirna Mires, MD 04/27/14 1000

## 2014-04-27 NOTE — Procedures (Signed)
Patient was seen on dialysis and the procedure was supervised.  BFR 400  Via AVF BP is  141/60.   Patient appears to be tolerating treatment well- emergent HD for volume/sob  Louis Meckel 04/27/2014

## 2014-04-27 NOTE — ED Notes (Signed)
Report to [facility] nurse

## 2014-04-27 NOTE — Progress Notes (Signed)
Paged Dr. Melvia Heaps for Order for O2.  Pt 89% on RA.

## 2014-04-27 NOTE — ED Provider Notes (Signed)
CSN: [id number]     Arrival date & time 04/27/14  0505 History   First MD Initiated Contact with Patient 04/27/14 [phone]     Chief Complaint  Patient presents with  . 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   He is BIB EMS from home with increasingly severe SOB over the past 3 to 4 days. Hx obtained mainly from the patient's wife in light of respiratory distress. Paramedics report that the patient had already been placed on supplemental 02 by FIRE. RA sats were not obtained. The patient was started on a NRB face mask and an albuterol SVN was initiated.   Patient's wife reports that he had a full run of H/D, as scheduled, 2 days ago and is due at 1100 today. He has not had fever or chest pain. He still makes urine. No recent changes to any medications. Reports compliance with all meds.   Past Medical History  Diagnosis Date  . Anemia   . Diastolic dysfunction   . Retinitis pigmentosa     Blindness--has shunt --placed yrs ago in [place]..  . Arthritis     Gout  . Hypertension     dx--"long time"  . CKD (chronic kidney disease), stage IV     a. L upper extremity AV fistula created 07/2012.  [name] BPH (benign prostatic hyperplasia)   . Blind   . Gangrene of finger     a. 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  . Colon polyps   . Pericardial effusion     a. Noted on echo 10/2009. b. Again seen on echo 09/2013.  [name] CHF (congestive heart failure)    Past Surgical History  Procedure Laterality Date  . Eye surgery    . Knee ligament reconstruction      left  . Av fistula placement  07/15/2012    Procedure: ARTERIOVENOUS (AV) FISTULA CREATION;  Surgeon: Rosetta Posner, MD;  Location: [place];  Service: Vascular;  Laterality: Left;  . Amputation  12/30/2012    Procedure: AMPUTATION DIGIT;   Surgeon: Tennis Must, MD;  Location: [place];  Service: Orthopedics;  Laterality: Left;  LEFT SMALL FINGER AMPUTATION   Family History  Problem Relation Age of Onset  . Cancer Mother     OVARIAN   History  Substance Use Topics  . Smoking status: Current Every Day Smoker -- 0.50 packs/day for 40 years    Types: Cigarettes  . Smokeless tobacco: Never Used  . Alcohol Use: No    Review of Systems Unable to obtain ROS in light of respiratory distress.  Please note level 5 caveat applies.     Allergies  Ibuprofen  Home Medications   Prior to Admission medications   Medication Sig Start Date End Date Taking? Authorizing Provider  albuterol (PROVENTIL HFA;VENTOLIN HFA) 108 (90 BASE) MCG/ACT inhaler Inhale 2 puffs into the lungs every 6 (six) hours as needed.    Historical Provider, MD  allopurinol (ZYLOPRIM) 100 MG tablet Take 100 mg by mouth 2 (two) times daily.    Historical Provider, MD  amLODipine (NORVASC) 10 MG tablet Take 10 mg by mouth daily.    Historical Provider, MD  aspirin 81 MG tablet Take 81 mg by mouth daily.    Historical Provider, MD  calcitRIOL (ROCALTROL) 0.25 MCG capsule Take 0.25 mcg by mouth daily.  Historical Provider, MD  calcium acetate (PHOSLO) 667 MG capsule Take 1 capsule (667 mg total) by mouth 3 (three) times daily with meals. 09/26/13   [name], MD  carvedilol (COREG) 25 MG tablet Take 25 mg by mouth 2 (two) times daily with a meal.    Historical Provider, MD  colchicine 0.6 MG tablet Take 0.6 mg by mouth daily as needed. For gout    Historical Provider, MD  darbepoetin (ARANESP) 100 MCG/0.5ML SOLN injection Inject 0.5 mLs (100 mcg total) into the vein every Thursday with hemodialysis. 09/26/13   [name], MD  doxercalciferol (HECTOROL) 4 MCG/2ML injection Inject 0.5 mLs (1 mcg total) into the vein Every Tuesday,Thursday,and Saturday with dialysis. 09/26/13   [name], MD  HYDROcodone-acetaminophen  (NORCO) 5-325 MG per tablet Take 1 tablet by mouth every 6 (six) hours as needed for pain. 12/30/12   Tennis Must, MD  silodosin (RAPAFLO) 8 MG CAPS capsule Take 8 mg by mouth daily with breakfast.    Historical Provider, MD   BP 143/61  Temp(Src) 98.6 F (37 C) (Oral)  Resp 25  Ht 5' 6"  (1.676 m)  Wt 150 lb (68.04 kg)  BMI 24.22 kg/m2  SpO2 100% Physical Exam  ED Course  Procedures (including critical care time) Labs Review  Recent Results (from the past 2160 hour(s))  CULTURE, BLOOD (ROUTINE X 2)     Status: None   Collection Time    04/27/14  6:00 AM      Result Value Ref Range   Specimen Description BLOOD RIGHT FOREARM     Special Requests BOTTLES DRAWN AEROBIC AND ANAEROBIC 5CC EA     Culture  Setup Time       Value: 04/27/2014 11:10     Performed at [facility]   Culture       Value:        BLOOD CULTURE RECEIVED NO GROWTH TO DATE CULTURE WILL BE HELD FOR 5 DAYS BEFORE ISSUING A FINAL NEGATIVE REPORT     Performed at [facility]   Report Status PENDING    PRO B NATRIURETIC PEPTIDE     Status: Abnormal   Collection Time    04/27/14  6:00 AM      Result Value Ref Range   Pro B Natriuretic peptide (BNP) 8130.0 (*) 0 - 125 pg/mL  CBC WITH DIFFERENTIAL     Status: Abnormal   Collection Time    04/27/14  6:00 AM      Result Value Ref Range   WBC 4.2  4.0 - 10.5 K/uL   RBC 3.25 (*) 4.22 - 5.81 MIL/uL   Hemoglobin 9.8 (*) 13.0 - 17.0 g/dL   HCT 32.4 (*) 39.0 - 52.0 %   MCV 99.7  78.0 - 100.0 fL   MCH 30.2  26.0 - 34.0 pg   MCHC 30.2  30.0 - 36.0 g/dL   RDW 16.3 (*) 11.5 - 15.5 %   Platelets 79 (*) 150 - 400 K/uL   Comment: PLATELET COUNT CONFIRMED BY SMEAR     REPEATED TO VERIFY   Neutrophils Relative % 76  43 - 77 %   Neutro Abs 3.1  1.7 - 7.7 K/uL   Lymphocytes Relative 17  12 - 46 %   Lymphs Abs 0.7  0.7 - 4.0 K/uL   Monocytes Relative 7  3 - 12 %   Monocytes Absolute 0.3  0.1 - 1.0 K/uL   Eosinophils Relative 0  0 - 5 %   Eosinophils  Absolute 0.0  0.0 - 0.7 K/uL   Basophils Relative 0  0 - 1 %   Basophils Absolute 0.0  0.0 - 0.1 K/uL  COMPREHENSIVE METABOLIC PANEL     Status: Abnormal   Collection Time    04/27/14  6:00 AM      Result Value Ref Range   Sodium 143  137 - 147 mEq/L   Potassium 3.8  3.7 - 5.3 mEq/L   Chloride 102  96 - 112 mEq/L   CO2 27  19 - 32 mEq/L   Glucose, Bld 100 (*) 70 - 99 mg/dL   BUN 31 (*) 6 - 23 mg/dL   Creatinine, Ser 4.02 (*) 0.50 - 1.35 mg/dL   Calcium 9.2  8.4 - 10.5 mg/dL   Total Protein 7.4  6.0 - 8.3 g/dL   Albumin 3.5  3.5 - 5.2 g/dL   AST 15  0 - 37 U/L   ALT 12  0 - 53 U/L   Alkaline Phosphatase 64  39 - 117 U/L   Total Bilirubin 0.4  0.3 - 1.2 mg/dL   GFR calc non Af Amer 14 (*) >90 mL/min   GFR calc Af Amer 16 (*) >90 mL/min   Comment: (NOTE)     The eGFR has been calculated using the CKD EPI equation.     This calculation has not been validated in all clinical situations.     eGFR's persistently <90 mL/min signify possible Chronic Kidney     Disease.  LACTIC ACID, PLASMA     Status: None   Collection Time    04/27/14  6:00 AM      Result Value Ref Range   Lactic Acid, Venous 1.0  0.5 - 2.2 mmol/L  CBG MONITORING, ED     Status: Abnormal   Collection Time    04/27/14  6:07 AM      Result Value Ref Range   Glucose-Capillary 102 (*) 70 - 99 mg/dL  I-STAT TROPOININ, ED     Status: None   Collection Time    04/27/14  6:09 AM      Result Value Ref Range   Troponin i, poc 0.00  0.00 - 0.08 ng/mL   Comment 3            Comment: Due to the release kinetics of cTnI,     a negative result within the first hours     of the onset of symptoms does not rule out     myocardial infarction with certainty.     If myocardial infarction is still suspected,     repeat the test at appropriate intervals.  I-STAT CHEM 8, ED     Status: Abnormal   Collection Time    04/27/14  6:11 AM      Result Value Ref Range   Sodium 144  137 - 147 mEq/L   Potassium 3.8  3.7 - 5.3 mEq/L    Chloride 104  96 - 112 mEq/L   BUN 29 (*) 6 - 23 mg/dL   Creatinine, Ser 4.10 (*) 0.50 - 1.35 mg/dL   Glucose, Bld 101 (*) 70 - 99 mg/dL   Calcium, Ion 1.15  1.13 - 1.30 mmol/L   TCO2 28  0 - 100 mmol/L   Hemoglobin 11.2 (*) 13.0 - 17.0 g/dL   HCT 33.0 (*) 39.0 - 52.0 %  I-STAT VENOUS BLOOD GAS, ED     Status: Abnormal  Collection Time    04/27/14  6:11 AM      Result Value Ref Range   pH, Ven 7.373 (*) 7.250 - 7.300   pCO2, Ven 50.0  45.0 - 50.0 mmHg   pO2, Ven 39.0  30.0 - 45.0 mmHg   Bicarbonate 29.2 (*) 20.0 - 24.0 mEq/L   TCO2 31  0 - 100 mmol/L   O2 Saturation 71.0     Acid-Base Excess 3.0 (*) 0.0 - 2.0 mmol/L   Sample type VENOUS    CULTURE, BLOOD (ROUTINE X 2)     Status: None   Collection Time    04/27/14  6:30 AM      Result Value Ref Range   Specimen Description BLOOD RIGHT HAND     Special Requests BOTTLES DRAWN AEROBIC AND ANAEROBIC 5CC EACH     Culture  Setup Time       Value: 04/27/2014 11:10     Performed at [facility]   Culture       Value:        BLOOD CULTURE RECEIVED NO GROWTH TO DATE CULTURE WILL BE HELD FOR 5 DAYS BEFORE ISSUING A FINAL NEGATIVE REPORT     Performed at [facility]   Report Status PENDING    HEPATITIS B SURFACE ANTIGEN     Status: None   Collection Time    04/27/14 10:57 AM      Result Value Ref Range   Hepatitis B Surface Ag NEGATIVE  NEGATIVE   Comment: Performed at [facility]  CBC     Status: Abnormal   Collection Time    04/29/14  7:24 AM      Result Value Ref Range   WBC 5.5  4.0 - 10.5 K/uL   RBC 3.24 (*) 4.22 - 5.81 MIL/uL   Hemoglobin 9.8 (*) 13.0 - 17.0 g/dL   HCT 30.1 (*) 39.0 - 52.0 %   MCV 92.9  78.0 - 100.0 fL   MCH 30.2  26.0 - 34.0 pg   MCHC 32.6  30.0 - 36.0 g/dL   RDW 15.3  11.5 - 15.5 %   Platelets 91 (*) 150 - 400 K/uL   Comment: PLATELET COUNT CONFIRMED BY SMEAR  RENAL FUNCTION PANEL     Status: Abnormal   Collection Time    04/29/14  7:24 AM      Result Value Ref Range    Sodium 137  137 - 147 mEq/L   Potassium 4.4  3.7 - 5.3 mEq/L   Chloride 99  96 - 112 mEq/L   CO2 24  19 - 32 mEq/L   Glucose, Bld 117 (*) 70 - 99 mg/dL   BUN 25 (*) 6 - 23 mg/dL   Creatinine, Ser 3.48 (*) 0.50 - 1.35 mg/dL   Calcium 8.9  8.4 - 10.5 mg/dL   Phosphorus 3.1  2.3 - 4.6 mg/dL   Albumin 3.3 (*) 3.5 - 5.2 g/dL   GFR calc non Af Amer 17 (*) >90 mL/min   GFR calc Af Amer 19 (*) >90 mL/min   Comment: (NOTE)     The eGFR has been calculated using the CKD EPI equation.     This calculation has not been validated in all clinical situations.     eGFR's persistently <90 mL/min signify possible Chronic Kidney     Disease.   DG Chest Port 1 View (Final result)  Result time: 04/27/14 05:53:36    Final result by Rad Results In  Interface (04/27/14 05:53:36)    Narrative:   CLINICAL DATA: Respiratory distress.  EXAM: PORTABLE CHEST - 1 VIEW  COMPARISON: Chest x-ray 09/22/2013.  FINDINGS: Patchy multifocal areas of interstitial prominence and ill-defined airspace disease noted in the left mid lung, right base and right upper lobe, concerning for multilobar bronchopneumonia. No pleural effusions. No evidence of pulmonary edema. Heart size is mildly enlarged. Upper mediastinal contours are within normal limits. Atherosclerosis in the thoracic aorta. Tubing projecting over the right side of the chest and central abdomen presumably ventriculoperitoneal shunt tubing.  IMPRESSION: 1. Findings concerning for developing multilobar bronchopneumonia, as above. 2. Mild cardiomegaly. 3. Atherosclerosis.   Electronically Signed By: Vinnie Langton M.D. On: 04/27/2014 05:53   EKG: NSR, normal axis, normal intervals, normal QRS, non specific ST-T wave changes are stable compared to most recent EKG.   CRITICAL CARE Performed by: Elyn Peers   Total critical care time: 45m Critical care time was exclusive of separately billable procedures and treating other patients.  Critical  care was necessary to treat or prevent imminent or life-threatening deterioration.  Critical care was time spent personally by me on the following activities: development of treatment plan with patient and/or surrogate as well as nursing, discussions with consultants, evaluation of patient's response to treatment, examination of patient, obtaining history from patient or surrogate, ordering and performing treatments and interventions, ordering and review of laboratory studies, ordering and review of radiographic studies, pulse oximetry and re-evaluation of patient's condition.   MDM   Patient with multi-lobar pneumonia on CXR per Radiology interpretation. Although, clinical presentation is most consistent with pulmonary edema secondary to volume overload. Normal troponin. Patient is afebrile with normal WBC. Blood cultures obtained. Lactate noted to be pneumonia.  Patient treated with IV lasix for volume overload, as he still makes urine.   The patient will require admission for hemodialysis and monitoring for recovery from symptoms following H/D.     JElyn Peers MD 04/30/14 [phone]

## 2014-04-27 NOTE — ED Notes (Signed)
Pt transported to dialysis

## 2014-04-27 NOTE — Progress Notes (Signed)
Pt transported to dialysis on NIV.

## 2014-04-27 NOTE — ED Notes (Signed)
Spoke To dr Ashok Cordia, he is checking admit status wife informed. She had asked about imforming his dialysis site about wheter pt will be going today

## 2014-04-28 ENCOUNTER — Inpatient Hospital Stay (HOSPITAL_COMMUNITY): Payer: Medicare Other

## 2014-04-28 MED ORDER — IPRATROPIUM-ALBUTEROL 0.5-2.5 (3) MG/3ML IN SOLN
3.0000 mL | RESPIRATORY_TRACT | Status: DC
  Administered 2014-04-28 – 2014-04-29 (×4): 3 mL via RESPIRATORY_TRACT
  Filled 2014-04-28 (×4): qty 3

## 2014-04-28 MED ORDER — RENA-VITE PO TABS
1.0000 | ORAL_TABLET | Freq: Every day | ORAL | Status: DC
  Administered 2014-04-28 – 2014-04-30 (×3): 1 via ORAL
  Filled 2014-04-28 (×4): qty 1

## 2014-04-28 MED ORDER — DARBEPOETIN ALFA-POLYSORBATE 200 MCG/0.4ML IJ SOLN
200.0000 ug | INTRAMUSCULAR | Status: DC
  Filled 2014-04-28: qty 0.4

## 2014-04-28 MED ORDER — IPRATROPIUM BROMIDE 0.02 % IN SOLN
RESPIRATORY_TRACT | Status: AC
  Administered 2014-04-28: 0.5 mg
  Filled 2014-04-28: qty 2.5

## 2014-04-28 MED ORDER — ALBUTEROL SULFATE (2.5 MG/3ML) 0.083% IN NEBU
INHALATION_SOLUTION | RESPIRATORY_TRACT | Status: AC
  Administered 2014-04-28: 2.5 mg
  Filled 2014-04-28: qty 3

## 2014-04-28 MED ORDER — PREDNISONE 50 MG PO TABS
50.0000 mg | ORAL_TABLET | Freq: Every day | ORAL | Status: DC
  Administered 2014-04-28 – 2014-05-01 (×4): 50 mg via ORAL
  Filled 2014-04-28 (×6): qty 1

## 2014-04-28 MED ORDER — DEXTROSE 5 % IV SOLN
1.0000 g | INTRAVENOUS | Status: DC
  Administered 2014-04-28: 1 g via INTRAVENOUS
  Filled 2014-04-28 (×2): qty 10

## 2014-04-28 NOTE — Procedures (Signed)
Patient was seen on dialysis and the procedure was supervised.  BFR 400  Via AVF BP is  135/58.   Patient appears to be tolerating treatment well  Louis Meckel 04/28/2014

## 2014-04-28 NOTE — Progress Notes (Signed)
Subjective:   Breathing is a little better. Cramping on HD  Objective Filed Vitals:   04/28/14 0943 04/28/14 0949 04/28/14 1000 04/28/14 1030  BP: 136/62 124/53 131/68 121/65  Pulse: 64 61 65 60  Temp:      TempSrc:      Resp:      Height:      Weight:      SpO2:       Physical Exam General: alert and oriented. No acute distress Heart: RRR Lungs: inspiratory wheeze and rales bilat. Unlabored. sats 100% Abdomen: soft, nontender +BS Extremities: +1 LE edema Dialysis Access:  LUA AVF patent on HD  D[facility]: [facility]  4h 67kg 2/2.25 [facility] Heparin 4000 LUA AVF  Hectorol 2 Aranesp 200/wk on thurs Venofer 50/wk  Assessment/Plan: 1. SOB- pulm edema. Improving with HD. Blood cultures no growth Diffuse wheezing today, doubt this is all from volume as pt cramped w HD yest and today.  Heavy smoker and significant bronchospasm, have started AB, nebs and po steroids. Consider pulm consult if not improving. 2. ESRD - MWF @ [place]. HD again tomorrow  3. Anemia - hgb 11.2, cont aranesp q weds, watch cbc 4. Secondary hyperparathyroidism - Ca+9.8  phoslo 5. HTN/volume - 131/67 norvasc and coreg, still with excess volume, under edw- lower at DC 6. Nutrition - alb 3.5.renal diet, nepro, mulitvit  Shelle Iron, NP [employer] [phone] 04/28/2014,10:57 AM  LOS: 1 day   Pt seen, examined, agree w assess/plan as above with additions as indicated.  Kelly Splinter MD pager [phone]    cell [phone] 04/28/2014, 11:26 AM      Additional Objective Labs: Basic Metabolic Panel:  Recent Labs Lab 04/27/14 0600 04/27/14 0611  NA 143 144  K 3.8 3.8  CL 102 104  CO2 27  --   GLUCOSE 100* 101*  BUN 31* 29*  CREATININE 4.02* 4.10*  CALCIUM 9.2  --    Liver Function Tests:  Recent Labs Lab 04/27/14 0600  AST 15  ALT 12  ALKPHOS 64  BILITOT 0.4  PROT 7.4  ALBUMIN 3.5   No results found for this basename: LIPASE, AMYLASE,  in the last 168  hours CBC:  Recent Labs Lab 04/27/14 0600 04/27/14 0611  WBC 4.2  --   NEUTROABS 3.1  --   HGB 9.8* 11.2*  HCT 32.4* 33.0*  MCV 99.7  --   PLT 79*  --    Blood Culture    Component Value Date/Time   SDES BLOOD RIGHT HAND 04/27/2014 0630   SPECREQUEST BOTTLES DRAWN AEROBIC AND ANAEROBIC 5CC EACH 04/27/2014 0630   CULT  Value:        BLOOD CULTURE RECEIVED NO GROWTH TO DATE CULTURE WILL BE HELD FOR 5 DAYS BEFORE ISSUING A FINAL NEGATIVE REPORT Performed at [facility] 04/27/2014 0630   REPTSTATUS PENDING 04/27/2014 0630    Cardiac Enzymes: No results found for this basename: CKTOTAL, CKMB, CKMBINDEX, TROPONINI,  in the last 168 hours CBG:  Recent Labs Lab 04/27/14 0607  GLUCAP 102*   Iron Studies: No results found for this basename: IRON, TIBC, TRANSFERRIN, FERRITIN,  in the last 72 hours @lablastinr3 @ Studies/Results: Dg Chest Port 1 View  04/27/2014   CLINICAL DATA:  Respiratory distress.  EXAM: PORTABLE CHEST - 1 VIEW  COMPARISON:  Chest x-ray 09/22/2013.  FINDINGS: Patchy multifocal areas of interstitial prominence and ill-defined airspace disease noted in the left mid lung, right base and right upper lobe, concerning for  multilobar bronchopneumonia. No pleural effusions. No evidence of pulmonary edema. Heart size is mildly enlarged. Upper mediastinal contours are within normal limits. Atherosclerosis in the thoracic aorta. Tubing projecting over the right side of the chest and central abdomen presumably ventriculoperitoneal shunt tubing.  IMPRESSION: 1. Findings concerning for developing multilobar bronchopneumonia, as above. 2. Mild cardiomegaly. 3. Atherosclerosis.   Electronically Signed   By: Vinnie Langton M.D.   On: 04/27/2014 05:53   Medications:   . allopurinol  100 mg Oral Daily  . amLODipine  10 mg Oral Daily  . aspirin EC  81 mg Oral Daily  . calcium acetate  667 mg Oral TID WC  . carvedilol  25 mg Oral BID WC  . sodium chloride  3 mL Intravenous Q12H

## 2014-04-29 DIAGNOSIS — J441 Chronic obstructive pulmonary disease with (acute) exacerbation: Secondary | ICD-10-CM | POA: Diagnosis present

## 2014-04-29 DIAGNOSIS — J069 Acute upper respiratory infection, unspecified: Secondary | ICD-10-CM

## 2014-04-29 DIAGNOSIS — E8779 Other fluid overload: Secondary | ICD-10-CM

## 2014-04-29 DIAGNOSIS — J984 Other disorders of lung: Secondary | ICD-10-CM

## 2014-04-29 LAB — RENAL FUNCTION PANEL
Albumin: 3.3 g/dL — ABNORMAL LOW (ref 3.5–5.2)
BUN: 25 mg/dL — ABNORMAL HIGH (ref 6–23)
CALCIUM: 8.9 mg/dL (ref 8.4–10.5)
CO2: 24 mEq/L (ref 19–32)
Chloride: 99 mEq/L (ref 96–112)
Creatinine, Ser: 3.48 mg/dL — ABNORMAL HIGH (ref 0.50–1.35)
GFR calc non Af Amer: 17 mL/min — ABNORMAL LOW (ref >90)
GFR, EST AFRICAN AMERICAN: 19 mL/min — AB (ref >90)
GLUCOSE: 117 mg/dL — AB (ref 70–99)
Phosphorus: 3.1 mg/dL (ref 2.3–4.6)
Potassium: 4.4 mEq/L (ref 3.7–5.3)
SODIUM: 137 meq/L (ref 137–147)

## 2014-04-29 LAB — CBC
HCT: 30.1 % — ABNORMAL LOW (ref 39.0–52.0)
Hemoglobin: 9.8 g/dL — ABNORMAL LOW (ref 13.0–17.0)
MCH: 30.2 pg (ref 26.0–34.0)
MCHC: 32.6 g/dL (ref 30.0–36.0)
MCV: 92.9 fL (ref 78.0–100.0)
PLATELETS: 91 10*3/uL — AB (ref 150–400)
RBC: 3.24 MIL/uL — ABNORMAL LOW (ref 4.22–5.81)
RDW: 15.3 % (ref 11.5–15.5)
WBC: 5.5 10*3/uL (ref 4.0–10.5)

## 2014-04-29 MED ORDER — ALTEPLASE 2 MG IJ SOLR
2.0000 mg | Freq: Once | INTRAMUSCULAR | Status: DC | PRN
  Filled 2014-04-29: qty 2

## 2014-04-29 MED ORDER — NEPRO/CARBSTEADY PO LIQD
237.0000 mL | ORAL | Status: DC | PRN
Start: 2014-04-29 — End: 2014-04-29

## 2014-04-29 MED ORDER — NEPRO/CARBSTEADY PO LIQD
237.0000 mL | ORAL | Status: DC
  Administered 2014-04-29 – 2014-05-01 (×3): 237 mL via ORAL

## 2014-04-29 MED ORDER — LIDOCAINE-PRILOCAINE 2.5-2.5 % EX CREA: 1.0000 "application " | TOPICAL_CREAM | CUTANEOUS | Status: DC | PRN

## 2014-04-29 MED ORDER — LIDOCAINE HCL (PF) 1 % IJ SOLN
5.0000 mL | INTRAMUSCULAR | Status: DC | PRN
Start: 2014-04-29 — End: 2014-04-29

## 2014-04-29 MED ORDER — HEPARIN SODIUM (PORCINE) 1000 UNIT/ML DIALYSIS
4000.0000 [IU] | Freq: Once | INTRAMUSCULAR | Status: AC
  Administered 2014-04-29: 4000 [IU] via INTRAVENOUS_CENTRAL

## 2014-04-29 MED ORDER — PENTAFLUOROPROP-TETRAFLUOROETH EX AERO
1.0000 | INHALATION_SPRAY | CUTANEOUS | Status: DC | PRN
Start: 2014-04-29 — End: 2014-04-29

## 2014-04-29 MED ORDER — BUDESONIDE-FORMOTEROL FUMARATE 160-4.5 MCG/ACT IN AERO
2.0000 | INHALATION_SPRAY | Freq: Two times a day (BID) | RESPIRATORY_TRACT | Status: DC
  Administered 2014-04-29 – 2014-04-30 (×3): 2 via RESPIRATORY_TRACT
  Filled 2014-04-29: qty 6

## 2014-04-29 MED ORDER — ALBUTEROL SULFATE (2.5 MG/3ML) 0.083% IN NEBU: 2.5000 mg | INHALATION_SOLUTION | RESPIRATORY_TRACT | Status: DC | PRN

## 2014-04-29 MED ORDER — IPRATROPIUM-ALBUTEROL 0.5-2.5 (3) MG/3ML IN SOLN
3.0000 mL | Freq: Four times a day (QID) | RESPIRATORY_TRACT | Status: DC
  Administered 2014-04-29 – 2014-05-01 (×7): 3 mL via RESPIRATORY_TRACT
  Filled 2014-04-29 (×6): qty 3

## 2014-04-29 MED ORDER — SODIUM CHLORIDE 0.9 % IV SOLN
100.0000 mL | INTRAVENOUS | Status: DC | PRN
Start: 2014-04-29 — End: 2014-04-29

## 2014-04-29 MED ORDER — SODIUM CHLORIDE 0.9 % IV SOLN: 100.0000 mL | INTRAVENOUS | Status: DC | PRN

## 2014-04-29 MED ORDER — HEPARIN SODIUM (PORCINE) 1000 UNIT/ML DIALYSIS: 1000.0000 [IU] | INTRAMUSCULAR | Status: DC | PRN

## 2014-04-29 NOTE — Progress Notes (Signed)
  [facility] Progress Note   Subjective: Feeling better, still coughing some.   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 Also wheezing a lot and is a heavy smoker , 48 years and still smokes, denies hx of seeing lung doctor. Has albuterol prn nebs at home as only resp medication 2 ESRD on HD 3 Anemia on aranesp  4 HTN on norvasc and coreg 5 HPTH cont phoslo 6 Blind d/t RP  Plan- consulting pulm re: question of COPD / airways disease. Cont po pred and duoneb scheduled.  Will stop AB without fever or prod cough    Kelly Splinter MD  pager [phone]    cell [phone]  04/29/2014, 11:20 AM     Recent Labs Lab 04/27/14 0600 04/27/14 0611 04/29/14 0724  NA 143 144 137  K 3.8 3.8 4.4  CL 102 104 99  CO2 27  --  24  GLUCOSE 100* 101* 117*  BUN 31* 29* 25*  CREATININE 4.02* 4.10* 3.48*  CALCIUM 9.2  --  8.9  PHOS  --   --  3.1    Recent Labs Lab 04/27/14 0600 04/29/14 0724  AST 15  --   ALT 12  --   ALKPHOS 64  --   BILITOT 0.4  --   PROT 7.4  --   ALBUMIN 3.5 3.3*    Recent Labs Lab 04/27/14 0600 04/27/14 0611 04/29/14 0724  WBC 4.2  --  5.5  NEUTROABS 3.1  --   --   HGB 9.8* 11.2* 9.8*  HCT 32.4* 33.0* 30.1*  MCV 99.7  --  92.9  PLT 79*  --  91*   . allopurinol  100 mg Oral Daily  . amLODipine  10 mg Oral Daily   . aspirin EC  81 mg Oral Daily  . calcium acetate  667 mg Oral TID WC  . carvedilol  25 mg Oral BID WC  . cefTRIAXone (ROCEPHIN)  IV  1 g Intravenous Q24H  . darbepoetin (ARANESP) injection - DIALYSIS  200 mcg Intravenous Q Wed-HD  . ipratropium-albuterol  3 mL Nebulization Q4H  . multivitamin  1 tablet Oral QHS  . predniSONE  50 mg Oral Q breakfast  . sodium chloride  3 mL Intravenous Q12H     sodium chloride, sodium chloride, acetaminophen, acetaminophen, alteplase, feeding supplement (NEPRO CARB STEADY), heparin, HYDROcodone-acetaminophen, lidocaine (PF), lidocaine-prilocaine, ondansetron (ZOFRAN) IV, ondansetron, pentafluoroprop-tetrafluoroeth

## 2014-04-29 NOTE — Procedures (Signed)
Patient was seen on dialysis and the procedure was supervised.  BFR 350  Via AVF BP is  103/49.   Patient appears to be tolerating treatment well  Louis Meckel 04/29/2014

## 2014-04-29 NOTE — Progress Notes (Signed)
INITIAL NUTRITION ASSESSMENT  DOCUMENTATION CODES Per approved criteria  -Not Applicable   INTERVENTION: Add Nepro Shake po daily, each supplement provides 425 kcal and 19 grams protein. RD to continue to follow nutrition care plan.  NUTRITION DIAGNOSIS: Inadequate oral intake related to poor appetite as evidenced by patient report, recent weight loss.   Goal: Intake to meet >90% of estimated nutrition needs.  Monitor:  weight trends, lab trends, I/O's, PO intake, supplement tolerance  Reason for Assessment: Malnutrition Screening Tool  68 y.o. male  Admitting Dx: Acute respiratory failure  ASSESSMENT: PMHx significant for HTN, HF, ESRD on HD. Admitted with SOB. Work-up reveals pulmonary edema.  EDW per renal is 67 kg, pt is currently 61 kg.  Currently ordered for a Renal Diet, eating 100% of his meals per document flowsheets, however RD saw breakfast tray and pt only consumed 25% of his breakfast. We discussed his weight. This RD asked pt if his weight loss was 2/2 fluid or appetite and he says likely his fluid because he is eating well. He states that he doesn't take an oral nutrition supplements because he eats a lot of meat/proteins. Discussed that he isn't really eating well presently and he is agreeable to trying Nepro. RD conducted brief physical exam and pt without any significant fat/muscle wasting.  Potassium and phosphorus WNL  Height: Ht Readings from Last 1 Encounters:  04/27/14 5\' 5"  (1.651 m)    Weight: Wt Readings from Last 1 Encounters:  04/29/14 134 lb 7.7 oz (61 kg)    Ideal Body Weight: 136 lb  % Ideal Body Weight: 99%  Wt Readings from Last 10 Encounters:  04/29/14 134 lb 7.7 oz (61 kg)  09/26/13 142 lb 13.7 oz (64.8 kg)  09/26/13 142 lb 13.7 oz (64.8 kg)  12/25/12 138 lb (62.596 kg)  12/25/12 138 lb (62.596 kg)  08/13/12 138 lb 8 oz (62.823 kg)  08/07/12 145 lb (65.772 kg)  07/10/12 143 lb (64.864 kg)  07/02/12 163 lb (73.936 kg)  04/16/12  163 lb (73.936 kg)    Usual Body Weight: 67 kg  % Usual Body Weight: 91%  BMI:  Body mass index is 22.38 kg/(m^2). WNL  Estimated Nutritional Needs: Kcal: 1700 - 1900 Protein: at least 73 grams daily Fluid: 1.2 liters  Skin: intact  Diet Order: Renal with 1200 ml Fluid Restriction  EDUCATION NEEDS: -No education needs identified at this time   Intake/Output Summary (Last 24 hours) at 04/29/14 1228 Last data filed at 04/29/14 1110  Gross per 24 hour  Intake    770 ml  Output   2718 ml  Net  -1948 ml    Last BM: PTA  Labs:   Recent Labs Lab 04/27/14 0600 04/27/14 0611 04/29/14 0724  NA 143 144 137  K 3.8 3.8 4.4  CL 102 104 99  CO2 27  --  24  BUN 31* 29* 25*  CREATININE 4.02* 4.10* 3.48*  CALCIUM 9.2  --  8.9  PHOS  --   --  3.1  GLUCOSE 100* 101* 117*    CBG (last 3)   Recent Labs  04/27/14 0607  GLUCAP 102*    Scheduled Meds: . allopurinol  100 mg Oral Daily  . amLODipine  10 mg Oral Daily  . aspirin EC  81 mg Oral Daily  . calcium acetate  667 mg Oral TID WC  . carvedilol  25 mg Oral BID WC  . darbepoetin (ARANESP) injection - DIALYSIS  200 mcg Intravenous  Q Wed-HD  . ipratropium-albuterol  3 mL Nebulization Q4H  . multivitamin  1 tablet Oral QHS  . predniSONE  50 mg Oral Q breakfast  . sodium chloride  3 mL Intravenous Q12H    Continuous Infusions:   Past Medical History  Diagnosis Date  . Anemia   . Diastolic dysfunction   . Retinitis pigmentosa     Blindness--has shunt --placed yrs ago in [place]..  . Arthritis     Gout  . Hypertension     dx--"long time"  . CKD (chronic kidney disease), stage IV     a. L upper extremity AV fistula created 07/2012.  [name] BPH (benign prostatic hyperplasia)   . Blind   . Gangrene of finger     a. 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  . Colon polyps   . Pericardial effusion     a. Noted on echo 10/2009. b. Again seen on echo 09/2013.  [name] CHF (congestive heart failure)     Past  Surgical History  Procedure Laterality Date  . Eye surgery    . Knee ligament reconstruction      left  . Av fistula placement  07/15/2012    Procedure: ARTERIOVENOUS (AV) FISTULA CREATION;  Surgeon: Rosetta Posner, MD;  Location: [place];  Service: Vascular;  Laterality: Left;  . Amputation  12/30/2012    Procedure: AMPUTATION DIGIT;  Surgeon: Tennis Must, MD;  Location: [place];  Service: Orthopedics;  Laterality: Left;  LEFT SMALL FINGER AMPUTATION    Inda Coke MS, RD, LDN Inpatient Registered Dietitian Pager: [phone] After-hours pager: [phone]

## 2014-04-29 NOTE — Progress Notes (Signed)
PT Cancellation Note  Patient Details Name: Cameron Mendez MRN: [id number] DOB: June 09, 1946   Cancelled Treatment:    Reason Eval/Treat Not Completed: Patient at procedure or test/unavailable  Pt currently in HD; Will follow-up for PT eval alter today as time allows, otherwise will return for PT eval tomorrow; Thank you,   Roney Marion, PT  Acute Rehabilitation Services Pager [phone] Office [phone]    Mila Homer [place] 04/29/2014, 8:16 AM

## 2014-04-29 NOTE — Consult Note (Signed)
Name: Cameron Mendez MRN: [id number] DOB: 1946-08-08    ADMISSION DATE:  04/27/2014 CONSULTATION DATE:  04/28/2014  REFERRING MD :  Jonnie Finner PRIMARY SERVICE:  Nephrology  CHIEF COMPLAINT:  SOB  BRIEF PATIENT DESCRIPTION: 68 y.o. M with ESRD, admitted 5/18 with acute SOB/resp distress.  Received HD x 3 for volume overload, still having SOB and now bronchospastic.  PCCM consulted.  SIGNIFICANT EVENTS / STUDIES:  5/18 admitted.  Received HD x 3 5/20 PCCM consulted  LINES / TUBES: Left forearm AVF  CULTURES: Blood 5/18 >>>  ANTIBIOTICS: Ceftriaxone 5/19 >>> 5/20   HISTORY OF PRESENT ILLNESS:  Cameron Mendez is a 68 y.o. M with PMH significant for ESRD, CHF, Blindness, HTN, BPH, who presented on 5/18 with acute SOB.  He was seen by nephrology and found to have pulmonary edema thought secondary to volume overload.  He was placed on BiPAP and started on HD.  Received HD x 3.  On 5/20, pt was still having some SOB and was moderately bronchospastic on exam.  He was placed on prednisone, duonebs, bipap, PCCM was consulted for further evaluation. Pt is an active smoker, has smoked for 48 years, unable to tell me how much.  Tells me that he is open to try and quit; however, he has tried before and failed.  He says that he tried a nicotine patch but is unsure how long he tried it for.  He has had a cough on and off for past few weeks, non-productive.  Denies fevers/chills/sweats, chest pain.  Has had some wheezing on and off, mainly in past few days.  Reports occasional sore throat.  PAST MEDICAL HISTORY :  Past Medical History  Diagnosis Date  . Anemia   . Diastolic dysfunction   . Retinitis pigmentosa     Blindness--has shunt --placed yrs ago in [place]..  . Arthritis     Gout  . Hypertension     dx--"long time"  . CKD (chronic kidney disease), stage IV     a. L upper extremity AV fistula created 07/2012.  [name] BPH (benign prostatic hyperplasia)   . Blind   . Gangrene of finger     a. L  small finger gangrene 12/2012 s/p amputation.  . Colon polyps   . Pericardial effusion     a. Noted on echo 10/2009. b. Again seen on echo 09/2013.  [name] CHF (congestive heart failure)    Past Surgical History  Procedure Laterality Date  . Eye surgery    . Knee ligament reconstruction      left  . Av fistula placement  07/15/2012    Procedure: ARTERIOVENOUS (AV) FISTULA CREATION;  Surgeon: Rosetta Posner, MD;  Location: [place];  Service: Vascular;  Laterality: Left;  . Amputation  12/30/2012    Procedure: AMPUTATION DIGIT;  Surgeon: Tennis Must, MD;  Location: [place];  Service: Orthopedics;  Laterality: Left;  LEFT SMALL FINGER AMPUTATION   Prior to Admission medications   Medication Sig Start Date End Date Taking? Authorizing Provider  albuterol (PROVENTIL HFA;VENTOLIN HFA) 108 (90 BASE) MCG/ACT inhaler Inhale 2 puffs into the lungs every 6 (six) hours as needed for wheezing or shortness of breath.    Yes Historical Provider, MD  allopurinol (ZYLOPRIM) 100 MG tablet Take 100 mg by mouth daily.    Yes Historical Provider, MD  amLODipine (NORVASC) 10 MG tablet Take 10 mg by mouth daily.   Yes Historical Provider, MD  aspirin 81 MG tablet Take  81 mg by mouth daily.   Yes Historical Provider, MD  bisacodyl (DULCOLAX) 5 MG EC tablet Take 5 mg by mouth daily as needed for moderate constipation.   Yes Historical Provider, MD  calcium acetate (PHOSLO) 667 MG capsule Take 1 capsule (667 mg total) by mouth 3 (three) times daily with meals. 09/26/13  Yes [name], MD  carvedilol (COREG) 25 MG tablet Take 25 mg by mouth 2 (two) times daily with a meal.   Yes Historical Provider, MD  clotrimazole-betamethasone (LOTRISONE) cream Apply 1 application topically 2 (two) times daily.   Yes Historical Provider, MD  colchicine 0.6 MG tablet Take 0.6 mg by mouth daily as needed. For gout   Yes Historical Provider, MD  diphenhydrAMINE (BENADRYL) 25 MG tablet Take 25-50 mg by mouth every 6  (six) hours as needed for allergies.   Yes Historical Provider, MD  HYDROcodone-acetaminophen (NORCO) 5-325 MG per tablet Take 1 tablet by mouth every 6 (six) hours as needed for pain. 12/30/12  Yes Tennis Must, MD   Allergies  Allergen Reactions  . Ibuprofen Other (See Comments)    Kidney doctor does not want pt to take    FAMILY HISTORY:  Family History  Problem Relation Age of Onset  . Cancer Mother     OVARIAN   SOCIAL HISTORY:  reports that he has been smoking Cigarettes.  He has a 20 pack-year smoking history. He has never used smokeless tobacco. He reports that he does not drink alcohol or use illicit drugs.  REVIEW OF SYSTEMS:   All negative; except for those that are bolded, which indicate positives.  Constitutional: weight loss, weight gain, night sweats, fevers, chills, fatigue, weakness.  HEENT: headaches, sore throat, sneezing, nasal congestion, post nasal drip, difficulty swallowing, tooth/dental problems, visual complaints, visual changes, ear aches. Neuro: difficulty with speech, weakness, numbness, ataxia. CV:  chest pain, orthopnea, PND, swelling in lower extremities, dizziness, palpitations, syncope.  Resp: cough, hemoptysis, dyspnea, wheezing. GI  heartburn, indigestion, abdominal pain, nausea, vomiting, diarrhea, constipation, change in bowel habits, loss of appetite, hematemesis, melena, hematochezia.  GU: dysuria, change in color of urine, urgency or frequency, flank pain, hematuria. MSK: joint pain or swelling, decreased range of motion. Psych: change in mood or affect, depression, anxiety, suicidal ideations, homicidal ideations. Skin: rash, itching, bruising.   SUBJECTIVE:  Reports improvement in SOB.  Doesn't really feel like he has any trouble at the moment.   Vitals stable.  VITAL SIGNS: Temp:  [98 F (36.7 C)-98.7 F (37.1 C)] 98.4 F (36.9 C) (05/20 1110) Pulse Rate:  [61-72] 67 (05/20 1110) Resp:  [16-18] 18 (05/20 1110) BP: (98-153)/(37-79)  122/46 mmHg (05/20 1110) SpO2:  [92 %-100 %] 97 % (05/20 1110) Weight:  [61 kg (134 lb 7.7 oz)-63.6 kg (140 lb 3.4 oz)] 61 kg (134 lb 7.7 oz) (05/20 1110)  PHYSICAL EXAMINATION: General: Pleasant elderly male, sitting up in bed eating lunch, in NAD. Neuro: A&O x 3, non-focal.  HEENT: [place]/AT. PERRL, sclerae anicteric. Cardiovascular: RRR, no M/R/G.  Lungs: Respirations even and unlabored.  Scattered wheezes throughout though primarily in upper lung fields, mild crackles. Abdomen: BS x 4, soft, NT/ND.  Musculoskeletal: No gross deformities, no edema.  Skin: Intact, warm, no rashes.     Recent Labs Lab 04/27/14 0600 04/27/14 0611 04/29/14 0724  NA 143 144 137  K 3.8 3.8 4.4  CL 102 104 99  CO2 27  --  24  BUN 31* 29* 25*  CREATININE 4.02*  4.10* 3.48*  GLUCOSE 100* 101* 117*    Recent Labs Lab 04/27/14 0600 04/27/14 0611 04/29/14 0724  HGB 9.8* 11.2* 9.8*  HCT 32.4* 33.0* 30.1*  WBC 4.2  --  5.5  PLT 79*  --  91*   Dg Chest Port 1v Same Day  04/28/2014   CLINICAL DATA:  f/u cxr edema vs pna  EXAM: PORTABLE CHEST - 1 VIEW SAME DAY  COMPARISON:  04/27/2014  FINDINGS: Presumed right ventriculoperitoneal shunt courses across the right of the thorax. Presumed discontinued shunt is also identified, unchanged in position.  The heart is enlarged. No pulmonary edema. There has been some improvement in aeration of the lungs bilaterally with minimal patchy densities persisting in the mid lung zones.  IMPRESSION: Improvement in aeration.  Stable cardiomegaly.   Electronically Signed   By: Shon Hale M.D.   On: 04/28/2014 12:42    ASSESSMENT / PLAN:  Acute Respiratory Failure - likely multifactorial in the setting of known heart failure, ESRD with volume overload, tobacco abuse. Wheezing - broad DDx, though favor cardiac wheeze. ? COPD - Pt does have a long smoking hx, however, he has no documented PFT's in EPIC system and he does not recall ever seeing a pulmonologist of having PFT's  done. Tobacco Use Disorder Recs: - BD regimen adjusted. - Continue prednisone given bronchospasm on exam, would taper gradually. - Diurese / HD as much as BP permits. - Agree d/c abx given no obvious signs of infection. - Smoking cessation!  After discharge, he is open to seeing his PCP about smoking cessation options. - Once acute issues are resolved and pt is discharged, would recommend f/u with pulmonology for PFT's and medical management.  Montey Hora, PA - [facility] Pgr: [phone]  or [phone]  Significant wheezing on exam and cardiomegally on CXR, this is likely a combination of COPD exacerbation and volume overload (cardiac asthma).  I agree no role for abx here, continue steroids however and would recommend a slow taper over a 2 week period.  Bronchodilators adjusted as above.  Will start an inhaled steroid/LABA combination in the hospital (acknowledge he is on systemic steroids for now but would like him to get used to this prior to discharge).  Will most certainly need PFTs after the acute phase of his exacerbation is addressed so please arrange f/u with PCCM as outpatient for full PFT and closer monitoring.  PCCM will sign off, please call back if needed.  Rush Farmer, M.D. [facility]. Pager: [phone]. After hours pager: [phone].

## 2014-04-30 NOTE — Progress Notes (Signed)
Subjective:  Eating Brk  Feels better , no coughing now, HD past  3 days  For volume  Objective Vital signs in last 24 hours: Filed Vitals:   04/29/14 2111 04/30/14 0454 04/30/14 0735 04/30/14 0905  BP: 131/44 127/67 132/64   Pulse: 62 66 69   Temp: 98.1 F (36.7 C) 97.7 F (36.5 C) 98.5 F (36.9 C)   TempSrc:   Oral   Resp: 17 17 18    Height:      Weight: 60.9 kg (134 lb 4.2 oz)     SpO2: 91% 98% 98% 98%   Weight change: -0.9 kg (-1 lb 15.8 oz)  Physical Exam: General: alert , nad, eating brk Heart: RRR, no rub, mur Lungs: Bilat wheezing > ext than inspir.  With some air movement heard Abdomen: bs pos. ,soft , non tender, non distended Extremities:  No pedal edema Dialysis Access: Pos. Bruit L UA AVF  HD: MWF [place]  4h 67kg 2/2.25 Bath Heparin 4000 LUA AVF  Hect 2 Aranesp 200 q week on Thurs Venofer 50/wk  Problem/Plan:  1 Dyspnea / resp distress secondary to copd/Volume overload.- clinically improved, 3.218 cc uf yesterday  Wt 60.9( EDW= volume excess much better, weight is down 6.1  kg under prior dry wt after HD x 3 /  wheezing improved and is a heavy smoker= 48 years smoking still/Pulmonary Consult yesterday  With rec.=  Start inhaled steroids/ LABA combination "AND get USE to this PRIOR to DC",To continue and  taper steroids over 2 week period/ Bronchodialtor , no antib. PCCM Fu as op for Full PFT and closer monitoring/ smoking cessation per primary MD/Has albuterol prn nebs at home as only resp medication  2 ESRD on HD [facility] MWF. k 4.4 yest./ hd in am 3 Anemia on aranesp  hgb 9.8 yest. 4 HTN on norvasc and coreg /132/64 am bp  5 HPTH cont phoslo with  Ca/ phos stable 6 Blind d/t RP 7 Disp  - dc  After hd tomor. if stable with new meds as per Pulm.  Ernest Haber, PA-C [facility] Beeper [phone] 04/30/2014,9:12 AM  LOS: 3 days   Pt seen, examined and agree w A/P as above.  Kelly Splinter MD pager [phone]    cell [phone] 04/30/2014, 11:47  AM    Labs: Basic Metabolic Panel:  Recent Labs Lab 04/27/14 0600 04/27/14 0611 04/29/14 0724  NA 143 144 137  K 3.8 3.8 4.4  CL 102 104 99  CO2 27  --  24  GLUCOSE 100* 101* 117*  BUN 31* 29* 25*  CREATININE 4.02* 4.10* 3.48*  CALCIUM 9.2  --  8.9  PHOS  --   --  3.1   Liver Function Tests:  Recent Labs Lab 04/27/14 0600 04/29/14 0724  AST 15  --   ALT 12  --   ALKPHOS 64  --   BILITOT 0.4  --   PROT 7.4  --   ALBUMIN 3.5 3.3*  CBC:  Recent Labs Lab 04/27/14 0600 04/27/14 0611 04/29/14 0724  WBC 4.2  --  5.5  NEUTROABS 3.1  --   --   HGB 9.8* 11.2* 9.8*  HCT 32.4* 33.0* 30.1*  MCV 99.7  --  92.9  PLT 79*  --  91*  CBG:  Recent Labs Lab 04/27/14 0607  GLUCAP 102*    Studies/Results: Dg Chest Port 1v Same Day  04/28/2014   CLINICAL DATA:  f/u cxr edema vs pna  EXAM: PORTABLE CHEST -  1 VIEW SAME DAY  COMPARISON:  04/27/2014  FINDINGS: Presumed right ventriculoperitoneal shunt courses across the right of the thorax. Presumed discontinued shunt is also identified, unchanged in position.  The heart is enlarged. No pulmonary edema. There has been some improvement in aeration of the lungs bilaterally with minimal patchy densities persisting in the mid lung zones.  IMPRESSION: Improvement in aeration.  Stable cardiomegaly.   Electronically Signed   By: Shon Hale M.D.   On: 04/28/2014 12:42   Medications:   . allopurinol  100 mg Oral Daily  . amLODipine  10 mg Oral Daily  . aspirin EC  81 mg Oral Daily  . budesonide-formoterol  2 puff Inhalation BID  . calcium acetate  667 mg Oral TID WC  . carvedilol  25 mg Oral BID WC  . darbepoetin (ARANESP) injection - DIALYSIS  200 mcg Intravenous Q Wed-HD  . feeding supplement (NEPRO CARB STEADY)  237 mL Oral Q24H  . ipratropium-albuterol  3 mL Nebulization Q6H  . multivitamin  1 tablet Oral QHS  . predniSONE  50 mg Oral Q breakfast  . sodium chloride  3 mL Intravenous Q12H

## 2014-04-30 NOTE — Evaluation (Signed)
Occupational Therapy Evaluation Patient Details Name: Cameron Mendez MRN: [id number] DOB: 1946/09/24 Today's Date: 04/30/2014    History of Present Illness 68 y.o. male with h/o diastolic CHF,  ESRD admitted with with acute SOB, resp distress.    Clinical Impression   Pt is at set up/sup - Mod I level with ADLs and ADL mobility. Pt has assist at home from his wife and has [employer] for transportation to HD. No further acute OT services are indicated at this time    Follow Up Recommendations  No OT follow up;Supervision/Assistance - 24 hour    Equipment Recommendations  None recommended by OT    Recommendations for Other Services       Precautions / Restrictions Precautions Precautions: Other (comment) (visually impaired) Precaution Comments: Pt is blind Restrictions Weight Bearing Restrictions: No      Mobility Bed Mobility               General bed mobility comments: pt up in recliner upon entering rooom  Transfers Overall transfer level: Modified independent Equipment used: 1 person hand held assist;Rolling walker (2 wheeled)                  Balance Overall balance assessment: Modified Independent                                          ADL Overall ADL's : At baseline                                       General ADL Comments: Pt able to perform ADLs with set up/sup, can stand at sink for grooming and ADL with sup. Tranfers to toilet and shower with Mod I -  sup     Vision  pt is legally blind                   Perception Perception Perception Tested?: No   Praxis Praxis Praxis tested?: Not tested    Pertinent Vitals/Pain No c/o pain, VSS     Hand Dominance Right   Extremity/Trunk Assessment Upper Extremity Assessment Upper Extremity Assessment: Overall WFL for tasks assessed   Lower Extremity Assessment Lower Extremity Assessment: Defer to PT evaluation   Cervical / Trunk  Assessment Cervical / Trunk Assessment: Normal   Communication Communication Communication: No difficulties   Cognition Arousal/Alertness: Awake/alert Behavior During Therapy: WFL for tasks assessed/performed Overall Cognitive Status: Within Functional Limits for tasks assessed                     General Comments   Pt  pleasant, cooperative and jovial                 Home Living Family/patient expects to be discharged to:: Private residence Living Arrangements: Spouse/significant other Available Help at Discharge: Family;Available PRN/intermittently Type of Home: House Home Access: Stairs to enter [employer] of Steps: 3 Entrance Stairs-Rails: Right;Left;Can reach both Home Layout: One level     Bathroom Shower/Tub: Teacher, early years/pre: Standard     Home Equipment: Cane - quad;Wheelchair - Designer, fashion/clothing - standard          Prior Functioning/Environment Level of Independence: Needs assistance    ADL's / Homemaking Assistance Needed: wife assists with bathing/dressing  Comments: wife does meal prep due to pt's vision deficits    OT Diagnosis:     OT Problem List:     OT Treatment/Interventions:      OT Goals(Current goals can be found in the care plan section) Acute Rehab OT Goals Patient Stated Goal: return home  OT Frequency:     Barriers to D/C:  none                        End of Session Equipment Utilized During Treatment: Gait belt;Rolling walker;Other (comment) (3 in 1)  Activity Tolerance: Patient tolerated treatment well Patient left: in chair;with call bell/phone within reach   Time: 1332-1359 OT Time Calculation (min): 27 min Charges:  OT General Charges $OT Visit: 1 Procedure OT Evaluation $Initial OT Evaluation Tier I: 1 Procedure OT Treatments $Therapeutic Activity: 8-22 mins G-Codes:    Mosetta Putt 19-May-2014, 2:10 PM

## 2014-04-30 NOTE — Care Management Note (Signed)
CARE MANAGEMENT NOTE 04/30/2014  Patient:  Cameron Mendez, Cameron Mendez   Account Number:  [id number]  Date Initiated:  04/30/2014  Documentation initiated by:  Hassani Sliney  Subjective/Objective Assessment:     Action/Plan:   04/30/2014 CM following for progression and d/c planning.   Anticipated DC Date:  04/30/2014   Anticipated DC Plan:  HOME/SELF CARE         Choice offered to / List presented to:             Status of service:  Completed, signed off Medicare Important Message given?  YES (If response is "NO", the following Medicare IM given date fields will be blank) Date Medicare IM given:  04/30/2014 Date Additional Medicare IM given:    Discharge Disposition:  HOME/SELF CARE  Per UR Regulation:    If discussed at Long Length of Stay Meetings, dates discussed:    Comments:

## 2014-04-30 NOTE — Evaluation (Signed)
Physical Therapy Evaluation Patient Details Name: Cameron Mendez MRN: [id number] DOB: 1946/11/10 Today's Date: 04/30/2014   History of Present Illness  68 y.o. male with h/o diastolic CHF,  ESRD admitted with with acute SOB, resp distress.   Clinical Impression  **Pt admitted with *SOB 2* CHF**. Pt currently with functional limitations due to the deficits listed below (see PT Problem List).  Pt will benefit from skilled PT to increase their independence and safety with mobility to allow discharge to the venue listed below.   *    Follow Up Recommendations No PT follow up    Equipment Recommendations  Rolling walker with 5" wheels    Recommendations for Other Services       Precautions / Restrictions Precautions Precautions: Other (comment) Precaution Comments: Pt is blind Restrictions Weight Bearing Restrictions: No      Mobility  Bed Mobility                  Transfers Overall transfer level: Modified independent Equipment used: Rolling walker (2 wheeled)                Ambulation/Gait Ambulation/Gait assistance: Min assist Ambulation Distance (Feet): 180 Feet Assistive device: Rolling walker (2 wheeled) Gait Pattern/deviations: WFL(Within Functional Limits)   Gait velocity interpretation: at or above normal speed for age/gender General Gait Details: min A to maneuver RW 2* visual deficit  Stairs            Wheelchair Mobility    Modified Rankin (Stroke Patients Only)       Balance Overall balance assessment: Modified Independent (steady with RW, pt denies falls)                                           Pertinent Vitals/Pain **0/10 pain 0/4 dyspnea with walking*    Home Living Family/patient expects to be discharged to:: Private residence Living Arrangements: Spouse/significant other Available Help at Discharge: Family;Available PRN/intermittently (wife works but will retire soon. Pt uses [employer] bus to get to HD.  ) Type of Home: House Home Access: Stairs to enter Entrance Stairs-Rails: Right;Left;Can reach both Entrance Stairs-Number of Steps: 3 Home Layout: One level Home Equipment: Cane - quad;Wheelchair - Designer, fashion/clothing - standard      Prior Function Level of Independence: Needs assistance      ADL's / Homemaking Assistance Needed: wife assists with bathing/dressing  Comments: wife does meal prep due to pt's vision deficits     Hand Dominance        Extremity/Trunk Assessment   Upper Extremity Assessment: Overall WFL for tasks assessed           Lower Extremity Assessment: Overall WFL for tasks assessed      Cervical / Trunk Assessment: Normal  Communication      Cognition Arousal/Alertness: Awake/alert Behavior During Therapy: WFL for tasks assessed/performed Overall Cognitive Status: Within Functional Limits for tasks assessed                      General Comments      Exercises        Assessment/Plan    PT Assessment Patient needs continued PT services  PT Diagnosis Generalized weakness   PT Problem List Decreased activity tolerance  PT Treatment Interventions Gait training;Therapeutic exercise;Therapeutic activities;Patient/family education   PT Goals (Current goals can be found in the Care Plan section)  Acute Rehab PT Goals Patient Stated Goal: return home, quit smoking PT Goal Formulation: With patient Time For Goal Achievement: 05/14/14 Potential to Achieve Goals: Good    Frequency Min 3X/week   Barriers to discharge        Co-evaluation               End of Session   Activity Tolerance: Patient tolerated treatment well Patient left: in chair;with call bell/phone within reach Nurse Communication: Mobility status         Time: 1255-1315 PT Time Calculation (min): 20 min   Charges:   PT Evaluation $Initial PT Evaluation Tier I: 1 Procedure PT Treatments $Gait Training: 8-22 mins   PT G CodesLucile Crater 04/30/2014, 1:20 PM [phone]

## 2014-05-01 LAB — RENAL FUNCTION PANEL
ALBUMIN: 3.3 g/dL — AB (ref 3.5–5.2)
BUN: 66 mg/dL — AB (ref 6–23)
CALCIUM: 8.7 mg/dL (ref 8.4–10.5)
CO2: 24 mEq/L (ref 19–32)
CREATININE: 5.94 mg/dL — AB (ref 0.50–1.35)
Chloride: 91 mEq/L — ABNORMAL LOW (ref 96–112)
GFR calc Af Amer: 10 mL/min — ABNORMAL LOW (ref >90)
GFR calc non Af Amer: 9 mL/min — ABNORMAL LOW (ref >90)
Glucose, Bld: 91 mg/dL (ref 70–99)
PHOSPHORUS: 3.9 mg/dL (ref 2.3–4.6)
POTASSIUM: 3.8 meq/L (ref 3.7–5.3)
Sodium: 133 mEq/L — ABNORMAL LOW (ref 137–147)

## 2014-05-01 LAB — CBC
HEMATOCRIT: 30.7 % — AB (ref 39.0–52.0)
Hemoglobin: 9.8 g/dL — ABNORMAL LOW (ref 13.0–17.0)
MCH: 30 pg (ref 26.0–34.0)
MCHC: 31.9 g/dL (ref 30.0–36.0)
MCV: 93.9 fL (ref 78.0–100.0)
Platelets: 90 10*3/uL — ABNORMAL LOW (ref 150–400)
RBC: 3.27 MIL/uL — ABNORMAL LOW (ref 4.22–5.81)
RDW: 15.5 % (ref 11.5–15.5)
WBC: 10.3 10*3/uL (ref 4.0–10.5)

## 2014-05-01 MED ORDER — PREDNISONE 1 MG PO TABS: ORAL_TABLET | ORAL | Status: DC

## 2014-05-01 MED ORDER — BUDESONIDE-FORMOTEROL FUMARATE 160-4.5 MCG/ACT IN AERO: 2.0000 | INHALATION_SPRAY | Freq: Two times a day (BID) | RESPIRATORY_TRACT | Status: DC

## 2014-05-01 MED ORDER — PENTAFLUOROPROP-TETRAFLUOROETH EX AERO
INHALATION_SPRAY | CUTANEOUS | Status: AC
  Administered 2014-05-01: 08:00:00
  Filled 2014-05-01: qty 103.5

## 2014-05-01 MED ORDER — DARBEPOETIN ALFA-POLYSORBATE 200 MCG/0.4ML IJ SOLN
INTRAMUSCULAR | Status: AC
  Filled 2014-05-01: qty 0.4

## 2014-05-01 MED ORDER — DARBEPOETIN ALFA-POLYSORBATE 200 MCG/0.4ML IJ SOLN
200.0000 ug | INTRAMUSCULAR | Status: DC
  Administered 2014-05-01: 200 ug via INTRAVENOUS

## 2014-05-01 NOTE — Procedures (Signed)
Patient was seen on dialysis and the procedure was supervised.  BFR 350  Via avf BP is  106/47.   Patient appears to be tolerating treatment well  Louis Meckel 05/01/2014

## 2014-05-01 NOTE — Progress Notes (Signed)
Subjective:   Breathing and cough are better, wants to go home today  Objective Filed Vitals:   05/01/14 0900 05/01/14 0930 05/01/14 1000 05/01/14 1030  BP: 106/56 112/48 103/48 116/44  Pulse: 59 65 56 57  Temp:      TempSrc:      Resp: 20 15  18   Height:      Weight:      SpO2:       Physical Exam General: alert and oriented. No acute distress. On HD Heart: RRR Lungs: bilat wheezing, exp>insp.  Abdomen: soft, nontender +BS Extremities: no edema Dialysis Access:  L AVF patent on HD  HD: MWF [place]  4h 67kg 2/2.25 Bath Heparin 4000 LUA AVF  Hect 2 Aranesp 200 q week on Thurs Venofer 50/wk  Problem/Plan:  1 Dyspnea / resp distress secondary to copd/Volume overload.- clinically improved, under prior dry wt after HD x 3 / wheezing improved and is a heavy smoker= 48 years smoking still/Pulmonary Consult yesterday With rec.= Start inhaled steroids/ LABA combo and use prior to DC. Cont and taper steroids over 2 week period/ Bronchodialtor , no antib. Will need PFT after DC 2 ESRD MWF @ [place]. HD now, uf goal 3500, lower edw at DC 3 Anemia hgb 9.8 on aranesp 200 q Wends 4 HTN 116/44 on norvasc and coreg 5 HPTH Ca+ 8.7, phos 3.9 cont phoslo  6 Blind d/t RP  7 Disp -DC if stable   Shelle Iron, NP [employer] [phone] 05/01/2014,11:05 AM  LOS: 4 days    Additional Objective Labs: Basic Metabolic Panel:  Recent Labs Lab 04/27/14 0600 04/27/14 0611 04/29/14 0724 05/01/14 0803  NA 143 144 137 133*  K 3.8 3.8 4.4 3.8  CL 102 104 99 91*  CO2 27  --  24 24  GLUCOSE 100* 101* 117* 91  BUN 31* 29* 25* 66*  CREATININE 4.02* 4.10* 3.48* 5.94*  CALCIUM 9.2  --  8.9 8.7  PHOS  --   --  3.1 3.9   Liver Function Tests:  Recent Labs Lab 04/27/14 0600 04/29/14 0724 05/01/14 0803  AST 15  --   --   ALT 12  --   --   ALKPHOS 64  --   --   BILITOT 0.4  --   --   PROT 7.4  --   --   ALBUMIN 3.5 3.3* 3.3*   No results found for this basename:  LIPASE, AMYLASE,  in the last 168 hours CBC:  Recent Labs Lab 04/27/14 0600 04/27/14 0611 04/29/14 0724 05/01/14 0803  WBC 4.2  --  5.5 10.3  NEUTROABS 3.1  --   --   --   HGB 9.8* 11.2* 9.8* 9.8*  HCT 32.4* 33.0* 30.1* 30.7*  MCV 99.7  --  92.9 93.9  PLT 79*  --  91* 90*   Blood Culture    Component Value Date/Time   SDES BLOOD RIGHT HAND 04/27/2014 0630   SPECREQUEST BOTTLES DRAWN AEROBIC AND ANAEROBIC 5CC EACH 04/27/2014 0630   CULT  Value:        BLOOD CULTURE RECEIVED NO GROWTH TO DATE CULTURE WILL BE HELD FOR 5 DAYS BEFORE ISSUING A FINAL NEGATIVE REPORT Performed at [facility] 04/27/2014 0630   REPTSTATUS PENDING 04/27/2014 0630    Cardiac Enzymes: No results found for this basename: CKTOTAL, CKMB, CKMBINDEX, TROPONINI,  in the last 168 hours CBG:  Recent Labs Lab 04/27/14 0607  GLUCAP 102*   Iron Studies:  No results found for this basename: IRON, TIBC, TRANSFERRIN, FERRITIN,  in the last 72 hours @lablastinr3 @ Studies/Results: No results found. Medications:   . allopurinol  100 mg Oral Daily  . amLODipine  10 mg Oral Daily  . aspirin EC  81 mg Oral Daily  . budesonide-formoterol  2 puff Inhalation BID  . calcium acetate  667 mg Oral TID WC  . carvedilol  25 mg Oral BID WC  . darbepoetin (ARANESP) injection - DIALYSIS  200 mcg Intravenous Q Wed-HD  . feeding supplement (NEPRO CARB STEADY)  237 mL Oral Q24H  . ipratropium-albuterol  3 mL Nebulization Q6H  . multivitamin  1 tablet Oral QHS  . predniSONE  50 mg Oral Q breakfast  . sodium chloride  3 mL Intravenous Q12H

## 2014-05-01 NOTE — Discharge Summary (Signed)
Physician Discharge Summary  Patient ID: Cameron Mendez MRN: [id number] DOB/AGE: Jun 10, 1946 68 y.o.  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 year-old with hx of HTN, diast HF and ESRD presenting with acute SOB and resp distress. Pt had had a "cold" early last week with some cough and congestion. Over the w/e he developed some DOE and then having difficulty breathing and was brought to ED. Pt is blind and uses a walking stick. He started HD in Oct 2014.   1 Acute respiratory failure due to vol excess and COPD exacerbation - pt had serial dialysis and dry wt was lowered from 67 to 61kg, all in all he lost 8-9kg of volume in the hospital.  Pt was still SOB and wheezing however after second HD and pt was started on Rx for suspected COPD flare.  F/U CXR showed clearing of patchy edema pattern.  Rx'd for COPD (see below) and breathing improved with mild exp wheezing and cough at time of discharge.  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 Pt was started on inhaled steroid/LABA combination (symicort) and pulm recommended 2 week taper of po prednisone. F/U w Dr Lamonte Sakai was scheduled for May 12, 2014 at 2:45 pm.   Consults: Pulmonary Dr Nelda Marseille  Treatments: dialysis: Hemodialysis  Discharge Exam: Blood pressure 106/47, pulse 58, temperature 97.6 F (36.4 C), temperature source Oral,  resp. rate 16, height 5\' 5"  (1.651 m), weight 64.6 kg (142 lb 6.7 oz), SpO2 94.00%. General: alert and oriented. No acute distress. On HD  Heart: RRR  Lungs: bilat wheezing, exp>insp.  Abdomen: soft, nontender +BS  Extremities: no edema  Dialysis Access: L AVF patent on HD   Disposition: 01-Home or Self Care  Discharge Instructions   Discharge patient    Complete by:  As directed   Home w family            Medication List         albuterol 108 (90 BASE) MCG/ACT inhaler  Commonly known as:  PROVENTIL HFA;VENTOLIN HFA  Inhale 2 puffs into the lungs every 6 (six) hours as needed for wheezing or shortness of breath.     allopurinol 100 MG tablet  Commonly known as:  ZYLOPRIM  Take 100 mg by mouth daily.     amLODipine 10 MG tablet  Commonly known as:  NORVASC  Take 10 mg by mouth daily.     aspirin 81 MG tablet  Take 81 mg by mouth daily.     bisacodyl 5 MG EC tablet  Commonly known as:  DULCOLAX  Take 5 mg by mouth daily as needed for moderate constipation.     budesonide-formoterol 160-4.5  MCG/ACT inhaler  Commonly known as:  SYMBICORT  Inhale 2 puffs into the lungs 2 (two) times daily.     calcium acetate 667 MG capsule  Commonly known as:  PHOSLO  Take 1 capsule (667 mg total) by mouth 3 (three) times daily with meals.     carvedilol 25 MG tablet  Commonly known as:  COREG  Take 25 mg by mouth 2 (two) times daily with a meal.     clotrimazole-betamethasone cream  Commonly known as:  LOTRISONE  Apply 1 application topically 2 (two) times daily.     colchicine 0.6 MG tablet  Take 0.6 mg by mouth daily as needed. For gout     diphenhydrAMINE 25 MG tablet  Commonly known as:  BENADRYL  Take 25-50 mg by mouth every 6 (six) hours as needed for allergies.     HYDROcodone-acetaminophen 5-325 MG per tablet  Commonly known as:  NORCO  Take 1 tablet by mouth every 6 (six) hours as needed for pain.     predniSONE 1 MG tablet  Commonly known as:  DELTASONE   Take 4 tabs daily for 4 days, then 3 tabs daily for 4 days, then 2 tabs daily for 4 days, then 1 tab daily for 4 days then stop.           Follow-up Information   Follow up with [name]., MD On 05/12/2014. (245 pm )    Specialty:  Pulmonary Disease   Contact information:   [address] [address] [phone]       Signed: [name] 05/01/2014, 12:13 PM

## 2014-05-01 NOTE — Progress Notes (Signed)
  Patient Discharge:  Disposition: home with wife  Education: educated patient on discharge medications, discharge instructions, and follow-up appointments.  Patient verbalized understanding.  Unable to sign AVS due to blindness  IV: previously removed  Telemetry: removed and returned, CCMD notified   Follow-up appointments: previously scheduled, patient verbalized understanding  Prescriptions:  Sent electronically to [employer] :  Escorted to car (wife driving) with NT  Belongings: gathered by patient and wife

## 2014-05-03 LAB — CULTURE, BLOOD (ROUTINE X 2)
CULTURE: NO GROWTH
Culture: NO GROWTH

## 2014-05-12 ENCOUNTER — Encounter: Payer: Self-pay | Admitting: Emergency Medicine

## 2014-05-12 ENCOUNTER — Ambulatory Visit (INDEPENDENT_AMBULATORY_CARE_PROVIDER_SITE_OTHER): Payer: Medicare Other | Admitting: Emergency Medicine

## 2014-05-12 VITALS — BP 124/48 | HR 64 | Temp 98.2°F | Ht 64.0 in | Wt 148.0 lb

## 2014-05-12 DIAGNOSIS — R0609 Other forms of dyspnea: Secondary | ICD-10-CM

## 2014-05-12 DIAGNOSIS — J449 Chronic obstructive pulmonary disease, unspecified: Secondary | ICD-10-CM

## 2014-05-12 DIAGNOSIS — R0600 Dyspnea, unspecified: Secondary | ICD-10-CM

## 2014-05-12 DIAGNOSIS — R0989 Other specified symptoms and signs involving the circulatory and respiratory systems: Secondary | ICD-10-CM

## 2014-05-12 NOTE — Patient Instructions (Signed)
Do not start symbicort. We will not use any scheduled inhaled medications for now.  You may continue to use your albuterol 2 puffs as needed.  We will perform full pulmonary function testing  Follow with Dr Lamonte Sakai next available after your PFT to review your breathing tests and to discuss starting a scheduled inhaled medication.

## 2014-05-12 NOTE — Assessment & Plan Note (Signed)
He had a hospitalization for multifactorial resp failure. Suspect that he does have COPD - he has benefited from albuterol before. Severity is unclear. He never took the pred taper or symbicort that were on his discharge med list.  - will perform full PFt before starting scheduled meds - albuterol prn - rov next available.

## 2014-05-12 NOTE — Progress Notes (Signed)
Subjective:    Patient ID: Cameron Mendez, male    DOB: 09/04/1946, 68 y.o.   MRN: [id number]  HPI 68 yo smoker (30pk-yr, 0.5pk/day), hx of HTN, diastolic dysfxn, chronic renal disease on HD. He was admitted to [facility] 5/18-5/22 with resp failure felt to be volume overload + possible COPD exacerbation. At baseline he does not cough much, he does hear some wheezing.    Review of Systems  Constitutional: Negative for fever and unexpected weight change.  HENT: Negative for congestion, dental problem, ear pain, nosebleeds, postnasal drip, rhinorrhea, sinus pressure, sneezing, sore throat and trouble swallowing.   Eyes: Negative for redness and itching.  Respiratory: Positive for shortness of breath. Negative for cough, chest tightness and wheezing.   Cardiovascular: Negative for palpitations and leg swelling.  Gastrointestinal: Negative for nausea and vomiting.  Genitourinary: Negative for dysuria.  Musculoskeletal: Negative for joint swelling.  Skin: Negative for rash.  Neurological: Negative for headaches.  Hematological: Does not bruise/bleed easily.  Psychiatric/Behavioral: Negative for dysphoric mood. The patient is not nervous/anxious.    Past Medical History  Diagnosis Date  . Anemia   . Diastolic dysfunction   . Retinitis pigmentosa     Blindness--has shunt --placed yrs ago in [place]..  . Arthritis     Gout  . Hypertension     dx--"long time"  . CKD (chronic kidney disease), stage IV     a. L upper extremity AV fistula created 07/2012.  [name] BPH (benign prostatic hyperplasia)   . Blind   . Gangrene of finger     a. 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  . Colon polyps   . Pericardial effusion     a. Noted on echo 10/2009. b. Again seen on echo 09/2013.  [name] CHF (congestive heart failure)      Family History  Problem Relation Age of Onset  . Cancer Mother     OVARIAN     History   Social History  . Marital Status: Married    Spouse Name: N/A    Number of  Children: N/A  . Years of Education: N/A   Occupational History  . unemployeed    Social History Main Topics  . Smoking status: Current Every Day Smoker -- 0.50 packs/day for 50 years    Types: Cigarettes  . Smokeless tobacco: Never Used  . Alcohol Use: No  . Drug Use: No  . Sexual Activity: Not on file   Other Topics Concern  . Not on file   Social History Narrative  . No narrative on file     Allergies  Allergen Reactions  . Ibuprofen Other (See Comments)    Kidney doctor does not want pt to take     Outpatient Prescriptions Prior to Visit  Medication Sig Dispense Refill  . albuterol (PROVENTIL HFA;VENTOLIN HFA) 108 (90 BASE) MCG/ACT inhaler Inhale 2 puffs into the lungs every 6 (six) hours as needed for wheezing or shortness of breath.       . allopurinol (ZYLOPRIM) 100 MG tablet Take 100 mg by mouth daily.       [name] amLODipine (NORVASC) 10 MG tablet Take 10 mg by mouth daily.      [name] aspirin 81 MG tablet Take 81 mg by mouth daily.      . bisacodyl (DULCOLAX) 5 MG EC tablet Take 5 mg by mouth daily as needed for moderate constipation.      . calcium acetate (PHOSLO) 667 MG capsule Take 1 capsule (  667 mg total) by mouth 3 (three) times daily with meals.  90 capsule  0  . carvedilol (COREG) 25 MG tablet Take 25 mg by mouth 2 (two) times daily with a meal.      . clotrimazole-betamethasone (LOTRISONE) cream Apply 1 application topically 2 (two) times daily.      . colchicine 0.6 MG tablet Take 0.6 mg by mouth daily as needed. For gout      . diphenhydrAMINE (BENADRYL) 25 MG tablet Take 25-50 mg by mouth every 6 (six) hours as needed for allergies.      [name] HYDROcodone-acetaminophen (NORCO) 5-325 MG per tablet Take 1 tablet by mouth every 6 (six) hours as needed for pain.  20 tablet  0  . budesonide-formoterol (SYMBICORT) 160-4.5 MCG/ACT inhaler Inhale 2 puffs into the lungs 2 (two) times daily.  1 Inhaler  12  . predniSONE (DELTASONE) 1 MG tablet Take 4 tabs daily for 4 days, then  3 tabs daily for 4 days, then 2 tabs daily for 4 days, then 1 tab daily for 4 days then stop.  40 tablet  0   No facility-administered medications prior to visit.         Objective:   Physical Exam Filed Vitals:   05/12/14 1500  BP: 124/48  Pulse: 64  Temp: 98.2 F (36.8 C)  TempSrc: Oral  Height: 5\' 4"  (1.626 m)  Weight: 148 lb (67.132 kg)  SpO2: 97%   Gen: Pleasant, well-nourished, in no distress,  normal affect  ENT: No lesions,  mouth clear, blind in both eyes,   Neck: No JVD, no TMG, no carotid bruits  Lungs: No use of accessory muscles, no wheeze, few scattered basilar crackles.   Cardiovascular: RRR, heart sounds normal, no murmur or gallops, trace peripheral edema  Musculoskeletal: No deformities, no cyanosis or clubbing  Neuro: alert, non focal  Skin: Warm, no lesions or rashes     Assessment & Plan:  COPD (chronic obstructive pulmonary disease) He had a hospitalization for multifactorial resp failure. Suspect that he does have COPD - he has benefited from albuterol before. Severity is unclear. He never took the pred taper or symbicort that were on his discharge med list.  - will perform full PFt before starting scheduled meds - albuterol prn - rov next available.

## 2014-06-04 ENCOUNTER — Ambulatory Visit (INDEPENDENT_AMBULATORY_CARE_PROVIDER_SITE_OTHER): Payer: Medicare Other | Admitting: Emergency Medicine

## 2014-06-04 DIAGNOSIS — R0989 Other specified symptoms and signs involving the circulatory and respiratory systems: Secondary | ICD-10-CM

## 2014-06-04 DIAGNOSIS — R0600 Dyspnea, unspecified: Secondary | ICD-10-CM

## 2014-06-04 DIAGNOSIS — R0609 Other forms of dyspnea: Secondary | ICD-10-CM

## 2014-06-04 NOTE — Progress Notes (Signed)
PFT done today. 

## 2014-06-18 ENCOUNTER — Ambulatory Visit (INDEPENDENT_AMBULATORY_CARE_PROVIDER_SITE_OTHER): Payer: Medicare Other | Admitting: Emergency Medicine

## 2014-06-18 ENCOUNTER — Encounter: Payer: Self-pay | Admitting: Emergency Medicine

## 2014-06-18 VITALS — BP 128/68 | HR 55 | Ht 63.0 in | Wt 147.0 lb

## 2014-06-18 DIAGNOSIS — J449 Chronic obstructive pulmonary disease, unspecified: Secondary | ICD-10-CM

## 2014-06-18 NOTE — Assessment & Plan Note (Signed)
PFT show mixed disease, moderate obstruction. He was sent home from hospital on symbicort but never filled it - will start spiriva daily, assess resp status after ~ 1 month - rov 1

## 2014-06-18 NOTE — Patient Instructions (Signed)
We will start Spiriva one inhalation daily.  Use albuterol 2 puffs if needed for shortness of breath. Follow with Dr Lamonte Sakai in 1 month

## 2014-06-18 NOTE — Progress Notes (Signed)
   Subjective:    Patient ID: Cameron Mendez, male    DOB: 04-Feb-1946, 68 y.o.   MRN: [id number]  HPI 68 yo smoker (30pk-yr, 0.5pk/day), hx of HTN, diastolic dysfxn, chronic renal disease on HD. He was admitted to [facility] 5/18-5/22 with resp failure felt to be volume overload + possible COPD exacerbation. At baseline he does not cough much, he does hear some wheezing.   ROV 06/18/14 -- follows up for dyspnea, hx recent admission for resp failure, suspected COPD. He underwent PFT 06/04/14 > mixed disease, decreased DLCO that corrects for VA. Returns now to review. He is not using albuterol frequently. Not very active but also not SOB. Occasional cough.    Review of Systems  Constitutional: Negative for fever and unexpected weight change.  HENT: Negative for congestion, dental problem, ear pain, nosebleeds, postnasal drip, rhinorrhea, sinus pressure, sneezing, sore throat and trouble swallowing.   Eyes: Negative for redness and itching.  Respiratory: Positive for shortness of breath. Negative for cough, chest tightness and wheezing.   Cardiovascular: Negative for palpitations and leg swelling.  Gastrointestinal: Negative for nausea and vomiting.  Genitourinary: Negative for dysuria.  Musculoskeletal: Negative for joint swelling.  Skin: Negative for rash.  Neurological: Negative for headaches.  Hematological: Does not bruise/bleed easily.  Psychiatric/Behavioral: Negative for dysphoric mood. The patient is not nervous/anxious.        Objective:   Physical Exam Filed Vitals:   06/18/14 0855  BP: 128/68  Pulse: 55  Height: 5\' 3"  (1.6 m)  Weight: 147 lb (66.679 kg)  SpO2: 100%   Gen: Pleasant, well-nourished, in no distress,  normal affect  ENT: No lesions,  mouth clear, blind in both eyes,   Neck: No JVD, no TMG, no carotid bruits  Lungs: No use of accessory muscles, no wheeze, few scattered basilar crackles.   Cardiovascular: RRR, heart sounds normal, no murmur or gallops, trace  peripheral edema  Musculoskeletal: No deformities, no cyanosis or clubbing  Neuro: alert, non focal  Skin: Warm, no lesions or rashes     Assessment & Plan:  COPD (chronic obstructive pulmonary disease) PFT show mixed disease, moderate obstruction. He was sent home from hospital on symbicort but never filled it - will start spiriva daily, assess resp status after ~ 1 month - rov 1

## 2014-07-01 LAB — PULMONARY FUNCTION TEST
DL/VA % pred: 120 %
DL/VA: 4.93 ml/min/mmHg/L
DLCO UNC % PRED: 68 %
DLCO unc: 15.79 ml/min/mmHg
FEF 25-75 POST: 1.44 L/s
FEF 25-75 Pre: 1.47 L/sec
FEF2575-%Change-Post: -1 %
FEF2575-%PRED-POST: 74 %
FEF2575-%Pred-Pre: 75 %
FEV1-%Change-Post: 0 %
FEV1-%Pred-Post: 66 %
FEV1-%Pred-Pre: 66 %
FEV1-POST: 1.42 L
FEV1-PRE: 1.42 L
FEV1FVC-%CHANGE-POST: 1 %
FEV1FVC-%PRED-PRE: 107 %
FEV6-%Change-Post: -1 %
FEV6-%Pred-Post: 62 %
FEV6-%Pred-Pre: 63 %
FEV6-Post: 1.69 L
FEV6-Pre: 1.72 L
FEV6FVC-%Pred-Post: 106 %
FEV6FVC-%Pred-Pre: 106 %
FVC-%CHANGE-POST: -1 %
FVC-%Pred-Post: 59 %
FVC-%Pred-Pre: 59 %
FVC-PRE: 1.72 L
FVC-Post: 1.69 L
PRE FEV1/FVC RATIO: 83 %
PRE FEV6/FVC RATIO: 100 %
Post FEV1/FVC ratio: 84 %
Post FEV6/FVC ratio: 100 %

## 2014-08-04 ENCOUNTER — Ambulatory Visit (INDEPENDENT_AMBULATORY_CARE_PROVIDER_SITE_OTHER): Payer: Medicare Other | Admitting: Emergency Medicine

## 2014-08-04 ENCOUNTER — Encounter: Payer: Self-pay | Admitting: Emergency Medicine

## 2014-08-04 VITALS — BP 122/58 | HR 65 | Temp 98.0°F | Ht 63.0 in | Wt 157.2 lb

## 2014-08-04 DIAGNOSIS — J449 Chronic obstructive pulmonary disease, unspecified: Secondary | ICD-10-CM

## 2014-08-04 MED ORDER — TIOTROPIUM BROMIDE MONOHYDRATE 2.5 MCG/ACT IN AERS: 2.0000 | INHALATION_SPRAY | RESPIRATORY_TRACT | Status: DC

## 2014-08-04 NOTE — Assessment & Plan Note (Signed)
Seems to have benefited from Spiriva. Will order for him the Respimat, as I believe this will be easier for him to administer with his blindness.  Flu shot in Fall Follow with Dr Lamonte Sakai in 4 months or sooner if you have any problems.

## 2014-08-04 NOTE — Progress Notes (Signed)
   Subjective:    Patient ID: Cameron Mendez, male    DOB: 08-07-1946, 68 y.o.   MRN: [id number]  HPI 68 yo smoker (30pk-yr, 0.5pk/day), hx of HTN, diastolic dysfxn, chronic renal disease on HD. He was admitted to [facility] 5/18-5/22 with resp failure felt to be volume overload + possible COPD exacerbation. At baseline he does not cough much, he does hear some wheezing.   ROV 06/18/14 -- follows up for dyspnea, hx recent admission for resp failure, suspected COPD. He underwent PFT 06/04/14 > mixed disease, decreased DLCO that corrects for VA. Returns now to review. He is not using albuterol frequently. Not very active but also not SOB. Occasional cough.   ROV 08/04/14 -- follow up for probable COPD; Also w hx  HTN, diastolic dysfxn, chronic renal disease on HD. Last time we started Spiriva > he feels that it has helped his breathing. He hasn't had any apparent side effects. He is tolerating HD on MWF.    Review of Systems  Constitutional: Negative for fever and unexpected weight change.  HENT: Negative for congestion, dental problem, ear pain, nosebleeds, postnasal drip, rhinorrhea, sinus pressure, sneezing, sore throat and trouble swallowing.   Eyes: Negative for redness and itching.  Respiratory: Positive for shortness of breath. Negative for cough, chest tightness and wheezing.   Cardiovascular: Negative for palpitations and leg swelling.  Gastrointestinal: Negative for nausea and vomiting.  Genitourinary: Negative for dysuria.  Musculoskeletal: Negative for joint swelling.  Skin: Negative for rash.  Neurological: Negative for headaches.  Hematological: Does not bruise/bleed easily.  Psychiatric/Behavioral: Negative for dysphoric mood. The patient is not nervous/anxious.        Objective:   Physical Exam Filed Vitals:   08/04/14 0935  BP: 122/58  Pulse: 65  Temp: 98 F (36.7 C)  TempSrc: Oral  Height: 5\' 3"  (1.6 m)  Weight: 157 lb 3.2 oz (71.305 kg)  SpO2: 96%   Gen: Pleasant,  well-nourished, in no distress,  normal affect  ENT: No lesions,  mouth clear, blind in both eyes,   Neck: No JVD, no TMG, no carotid bruits  Lungs: No use of accessory muscles, no wheeze, few scattered basilar crackles.   Cardiovascular: RRR, heart sounds normal, no murmur or gallops, trace peripheral edema  Musculoskeletal: No deformities, no cyanosis or clubbing  Neuro: alert, non focal  Skin: Warm, no lesions or rashes     Assessment & Plan:  COPD (chronic obstructive pulmonary disease) Seems to have benefited from Spiriva. Will order for him the Respimat, as I believe this will be easier for him to administer with his blindness.  Flu shot in Fall Follow with Dr Lamonte Sakai in 4 months or sooner if you have any problems.

## 2014-08-04 NOTE — Patient Instructions (Addendum)
We will continue Spiriva once a day, 2 inhalations of Respimat  Keep albuterol available to use 2 puffs if needed for shortness of breath.  Be sure to get the Flu shot this Fall Follow with Dr Lamonte Sakai in 4 months or sooner if you have any problems.

## 2014-08-04 NOTE — Addendum Note (Signed)
Addended by: Virl Cagey on: 08/04/2014 10:12 AM   Modules accepted: Orders

## 2014-08-21 ENCOUNTER — Inpatient Hospital Stay (HOSPITAL_COMMUNITY)
Admission: EM | Admit: 2014-08-21 | Discharge: 2014-08-24 | DRG: 811 | Disposition: A | Discharge status: Home / Self Care | Payer: Medicare Other | Attending: Internal Medicine | Admitting: Internal Medicine

## 2014-08-21 ENCOUNTER — Encounter (HOSPITAL_COMMUNITY): Payer: Self-pay | Admitting: Emergency Medicine

## 2014-08-21 DIAGNOSIS — F17200 Nicotine dependence, unspecified, uncomplicated: Secondary | ICD-10-CM | POA: Diagnosis present

## 2014-08-21 DIAGNOSIS — I5189 Other ill-defined heart diseases: Secondary | ICD-10-CM | POA: Diagnosis present

## 2014-08-21 DIAGNOSIS — D61818 Other pancytopenia: Secondary | ICD-10-CM | POA: Diagnosis present

## 2014-08-21 DIAGNOSIS — I5032 Chronic diastolic (congestive) heart failure: Secondary | ICD-10-CM | POA: Diagnosis present

## 2014-08-21 DIAGNOSIS — J432 Centrilobular emphysema: Secondary | ICD-10-CM

## 2014-08-21 DIAGNOSIS — Z992 Dependence on renal dialysis: Secondary | ICD-10-CM | POA: Diagnosis not present

## 2014-08-21 DIAGNOSIS — Z860100 Personal history of colon polyps, unspecified: Secondary | ICD-10-CM

## 2014-08-21 DIAGNOSIS — J449 Chronic obstructive pulmonary disease, unspecified: Secondary | ICD-10-CM | POA: Diagnosis present

## 2014-08-21 DIAGNOSIS — N186 End stage renal disease: Secondary | ICD-10-CM | POA: Diagnosis present

## 2014-08-21 DIAGNOSIS — I10 Essential (primary) hypertension: Secondary | ICD-10-CM | POA: Diagnosis present

## 2014-08-21 DIAGNOSIS — J438 Other emphysema: Secondary | ICD-10-CM

## 2014-08-21 DIAGNOSIS — D649 Anemia, unspecified: Secondary | ICD-10-CM | POA: Diagnosis present

## 2014-08-21 DIAGNOSIS — J4489 Other specified chronic obstructive pulmonary disease: Secondary | ICD-10-CM | POA: Diagnosis present

## 2014-08-21 DIAGNOSIS — H548 Legal blindness, as defined in USA: Secondary | ICD-10-CM | POA: Diagnosis present

## 2014-08-21 DIAGNOSIS — D631 Anemia in chronic kidney disease: Secondary | ICD-10-CM | POA: Diagnosis present

## 2014-08-21 DIAGNOSIS — M109 Gout, unspecified: Secondary | ICD-10-CM | POA: Diagnosis present

## 2014-08-21 DIAGNOSIS — Z7982 Long term (current) use of aspirin: Secondary | ICD-10-CM

## 2014-08-21 DIAGNOSIS — Z9889 Other specified postprocedural states: Secondary | ICD-10-CM

## 2014-08-21 DIAGNOSIS — N039 Chronic nephritic syndrome with unspecified morphologic changes: Principal | ICD-10-CM

## 2014-08-21 DIAGNOSIS — M1990 Unspecified osteoarthritis, unspecified site: Secondary | ICD-10-CM

## 2014-08-21 DIAGNOSIS — I509 Heart failure, unspecified: Secondary | ICD-10-CM | POA: Diagnosis present

## 2014-08-21 DIAGNOSIS — I519 Heart disease, unspecified: Secondary | ICD-10-CM

## 2014-08-21 DIAGNOSIS — I120 Hypertensive chronic kidney disease with stage 5 chronic kidney disease or end stage renal disease: Secondary | ICD-10-CM | POA: Diagnosis present

## 2014-08-21 DIAGNOSIS — D696 Thrombocytopenia, unspecified: Secondary | ICD-10-CM

## 2014-08-21 LAB — POC OCCULT BLOOD, ED: Fecal Occult Bld: NEGATIVE

## 2014-08-21 LAB — CBC WITH DIFFERENTIAL/PLATELET
BASOS PCT: 0 % (ref 0–1)
Basophils Absolute: 0 10*3/uL (ref 0.0–0.1)
EOS ABS: 0 10*3/uL (ref 0.0–0.7)
EOS PCT: 0 % (ref 0–5)
HCT: 20 % — ABNORMAL LOW (ref 39.0–52.0)
Hemoglobin: 6.2 g/dL — CL (ref 13.0–17.0)
Lymphocytes Relative: 16 % (ref 12–46)
Lymphs Abs: 0.6 10*3/uL — ABNORMAL LOW (ref 0.7–4.0)
MCH: 29.8 pg (ref 26.0–34.0)
MCHC: 31 g/dL (ref 30.0–36.0)
MCV: 96.2 fL (ref 78.0–100.0)
MONOS PCT: 10 % (ref 3–12)
Monocytes Absolute: 0.4 10*3/uL (ref 0.1–1.0)
NEUTROS PCT: 73 % (ref 43–77)
Neutro Abs: 2.8 10*3/uL (ref 1.7–7.7)
Platelets: 72 10*3/uL — ABNORMAL LOW (ref 150–400)
RBC: 2.08 MIL/uL — ABNORMAL LOW (ref 4.22–5.81)
RDW: 15.4 % (ref 11.5–15.5)
WBC: 3.8 10*3/uL — ABNORMAL LOW (ref 4.0–10.5)

## 2014-08-21 LAB — PREPARE RBC (CROSSMATCH)

## 2014-08-21 LAB — HEMOGLOBIN AND HEMATOCRIT, BLOOD
HCT: 20.5 % — ABNORMAL LOW (ref 39.0–52.0)
HEMOGLOBIN: 6.6 g/dL — AB (ref 13.0–17.0)

## 2014-08-21 MED ORDER — CARVEDILOL 25 MG PO TABS
25.0000 mg | ORAL_TABLET | Freq: Two times a day (BID) | ORAL | Status: DC
  Administered 2014-08-22 – 2014-08-24 (×5): 25 mg via ORAL
  Filled 2014-08-21 (×8): qty 1

## 2014-08-21 MED ORDER — ONDANSETRON HCL 4 MG/2ML IJ SOLN: 4.0000 mg | Freq: Three times a day (TID) | INTRAMUSCULAR | Status: DC | PRN

## 2014-08-21 MED ORDER — CALCIUM ACETATE 667 MG PO CAPS
667.0000 mg | ORAL_CAPSULE | Freq: Three times a day (TID) | ORAL | Status: DC
  Administered 2014-08-22 – 2014-08-24 (×7): 667 mg via ORAL
  Filled 2014-08-21 (×12): qty 1

## 2014-08-21 MED ORDER — ALLOPURINOL 100 MG PO TABS
100.0000 mg | ORAL_TABLET | Freq: Every day | ORAL | Status: DC
  Administered 2014-08-22 – 2014-08-24 (×3): 100 mg via ORAL
  Filled 2014-08-21 (×3): qty 1

## 2014-08-21 MED ORDER — PANTOPRAZOLE SODIUM 40 MG PO TBEC
40.0000 mg | DELAYED_RELEASE_TABLET | Freq: Every day | ORAL | Status: DC
  Administered 2014-08-21 – 2014-08-24 (×3): 40 mg via ORAL
  Filled 2014-08-21 (×3): qty 1

## 2014-08-21 MED ORDER — ONDANSETRON HCL 4 MG PO TABS: 4.0000 mg | ORAL_TABLET | Freq: Four times a day (QID) | ORAL | Status: DC | PRN

## 2014-08-21 MED ORDER — ONDANSETRON HCL 4 MG/2ML IJ SOLN: 4.0000 mg | Freq: Four times a day (QID) | INTRAMUSCULAR | Status: DC | PRN

## 2014-08-21 MED ORDER — SODIUM CHLORIDE 0.9 % IV SOLN: 250.0000 mL | INTRAVENOUS | Status: DC | PRN

## 2014-08-21 MED ORDER — ACETAMINOPHEN 325 MG PO TABS: 650.0000 mg | ORAL_TABLET | Freq: Four times a day (QID) | ORAL | Status: DC | PRN

## 2014-08-21 MED ORDER — SODIUM CHLORIDE 0.9 % IV SOLN
Freq: Once | INTRAVENOUS | Status: AC
  Administered 2014-08-21: 23:00:00 via INTRAVENOUS

## 2014-08-21 MED ORDER — SODIUM CHLORIDE 0.9 % IJ SOLN
3.0000 mL | INTRAMUSCULAR | Status: DC | PRN
Start: 2014-08-21 — End: 2014-08-24

## 2014-08-21 MED ORDER — DIPHENHYDRAMINE HCL 25 MG PO TABS
25.0000 mg | ORAL_TABLET | Freq: Four times a day (QID) | ORAL | Status: DC | PRN
  Filled 2014-08-21: qty 2

## 2014-08-21 MED ORDER — SODIUM CHLORIDE 0.9 % IJ SOLN
3.0000 mL | Freq: Two times a day (BID) | INTRAMUSCULAR | Status: DC
Start: 2014-08-21 — End: 2014-08-24
  Administered 2014-08-21 – 2014-08-24 (×5): 3 mL via INTRAVENOUS

## 2014-08-21 MED ORDER — AMLODIPINE BESYLATE 10 MG PO TABS
10.0000 mg | ORAL_TABLET | Freq: Every evening | ORAL | Status: DC
  Administered 2014-08-21 – 2014-08-23 (×3): 10 mg via ORAL
  Filled 2014-08-21 (×4): qty 1

## 2014-08-21 MED ORDER — TIOTROPIUM BROMIDE MONOHYDRATE 2.5 MCG/ACT IN AERS: 2.0000 | INHALATION_SPRAY | RESPIRATORY_TRACT | Status: DC

## 2014-08-21 MED ORDER — ACETAMINOPHEN 650 MG RE SUPP: 650.0000 mg | Freq: Four times a day (QID) | RECTAL | Status: DC | PRN

## 2014-08-21 MED ORDER — OXYCODONE HCL 5 MG PO TABS: 5.0000 mg | ORAL_TABLET | ORAL | Status: DC | PRN

## 2014-08-21 MED ORDER — HYDROMORPHONE HCL PF 1 MG/ML IJ SOLN: 0.5000 mg | INTRAMUSCULAR | Status: DC | PRN

## 2014-08-21 MED ORDER — SODIUM CHLORIDE 0.9 % IV SOLN: 10.0000 mL/h | Freq: Once | INTRAVENOUS | Status: DC

## 2014-08-21 MED ORDER — BISACODYL 5 MG PO TBEC: 5.0000 mg | DELAYED_RELEASE_TABLET | Freq: Every day | ORAL | Status: DC | PRN

## 2014-08-21 MED ORDER — TIOTROPIUM BROMIDE MONOHYDRATE 18 MCG IN CAPS
18.0000 ug | ORAL_CAPSULE | Freq: Every day | RESPIRATORY_TRACT | Status: DC
  Administered 2014-08-23: 18 ug via RESPIRATORY_TRACT
  Filled 2014-08-21: qty 5

## 2014-08-21 NOTE — ED Provider Notes (Signed)
CSN: [id number]     Arrival date & time 08/21/14  24 History   First MD Initiated Contact with Patient 08/21/14 1704     Chief Complaint  Patient presents with  . Anemia     (Consider location/radiation/quality/duration/timing/severity/associated sxs/prior Treatment) Patient is a 68 y.o. male presenting with anemia.  Anemia Associated symptoms include congestion, fatigue and headaches. Pertinent negatives include no abdominal pain, chest pain, chills, fever, nausea or vomiting.   Pt is a 68 y/o male w/ PMHx of ESRD on HD, HTN, anemia of chronic disease, and blindness who presents to the ED for low hgb of 5.9 while at HD today. Per dialysis records pt has downtrending hgb since 9/2 of 7.7-> 6.0->5.9. His baseline hbg appears to be 9-10.0. He endorses fatigue and abdominal pain x 1 week. He currently does not have any abdominal pain. Denies N/V, night sweats, fevers, chills, diarrhea, blood in stools, recent illness, sick contacts, and dizziness. He cannot remember if he has had a blood transfusion before. He was previously on EPO shots before dialysis and is no longer on them. He sent in fecal occult cards for screening last year which wife reports was negative for blood. He is due for a colonoscopy this year.   Past Medical History  Diagnosis Date  . Anemia   . Diastolic dysfunction   . Retinitis pigmentosa     Blindness--has shunt --placed yrs ago in [place]..  . Arthritis     Gout  . Hypertension     dx--"long time"  . CKD (chronic kidney disease), stage IV     a. L upper extremity AV fistula created 07/2012.  [name] BPH (benign prostatic hyperplasia)   . Blind   . Gangrene of finger     a. 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  . Colon polyps   . Pericardial effusion     a. Noted on echo 10/2009. b. Again seen on echo 09/2013.  [name] CHF (congestive heart failure)    Past Surgical History  Procedure Laterality Date  . Eye surgery    . Knee ligament reconstruction      left   . Av fistula placement  07/15/2012    Procedure: ARTERIOVENOUS (AV) FISTULA CREATION;  Surgeon: Rosetta Posner, MD;  Location: [place];  Service: Vascular;  Laterality: Left;  . Amputation  12/30/2012    Procedure: AMPUTATION DIGIT;  Surgeon: Tennis Must, MD;  Location: [place];  Service: Orthopedics;  Laterality: Left;  LEFT SMALL FINGER AMPUTATION   Family History  Problem Relation Age of Onset  . Cancer Mother     OVARIAN   History  Substance Use Topics  . Smoking status: Current Every Day Smoker -- 0.50 packs/day for 50 years    Types: Cigarettes  . Smokeless tobacco: Never Used  . Alcohol Use: No    Review of Systems  Constitutional: Positive for fatigue. Negative for fever, chills and activity change.  HENT: Positive for congestion.   Respiratory: Negative for shortness of breath.   Cardiovascular: Negative for chest pain.  Gastrointestinal: Negative for nausea, vomiting, abdominal pain, diarrhea and blood in stool.  Neurological: Positive for headaches. Negative for dizziness.  Psychiatric/Behavioral: Positive for confusion (baseline).      Allergies  Ibuprofen and Nsaids  Home Medications   Prior to Admission medications   Medication Sig Start Date End Date Taking? Authorizing Provider  acetaminophen (TYLENOL) 500 MG tablet Take 1,000 mg by mouth every 6 (six) hours as  needed for moderate pain.   Yes Historical Provider, MD  allopurinol (ZYLOPRIM) 100 MG tablet Take 100 mg by mouth daily.    Yes Historical Provider, MD  amLODipine (NORVASC) 10 MG tablet Take 10 mg by mouth every evening.    Yes Historical Provider, MD  aspirin 81 MG tablet Take 81 mg by mouth every morning.    Yes Historical Provider, MD  bisacodyl (DULCOLAX) 5 MG EC tablet Take 5 mg by mouth daily as needed for moderate constipation.   Yes Historical Provider, MD  calcium acetate (PHOSLO) 667 MG capsule Take 1 capsule (667 mg total) by mouth 3 (three) times daily with meals. 09/26/13   Yes [name], MD  carvedilol (COREG) 25 MG tablet Take 25 mg by mouth 2 (two) times daily with a meal.   Yes Historical Provider, MD  clotrimazole-betamethasone (LOTRISONE) cream Apply 1 application topically 2 (two) times daily as needed (for rash).    Yes Historical Provider, MD  colchicine 0.6 MG tablet Take 0.6 mg by mouth daily as needed. For gout   Yes Historical Provider, MD  diphenhydrAMINE (BENADRYL) 25 MG tablet Take 25-50 mg by mouth every 6 (six) hours as needed for itching or allergies.    Yes Historical Provider, MD  Tiotropium Bromide Monohydrate (SPIRIVA RESPIMAT) 2.5 MCG/ACT AERS Inhale 2 puffs into the lungs every morning. 08/04/14  Yes [name], MD   BP 124/45  Pulse 73  Temp(Src) 97.9 F (36.6 C) (Oral)  Resp 16  SpO2 100% Physical Exam  Constitutional: He is oriented to person, place, and time. He appears well-developed and well-nourished.  HENT:  Post nasal drip   Neck:  Lt lateral lipoma on posterior neck   Cardiovascular: Normal rate and regular rhythm.   Pulmonary/Chest: Effort normal and breath sounds normal. He has no wheezes.  Abdominal: Soft. Bowel sounds are normal. There is no tenderness.  Neurological: He is alert and oriented to person, place, and time.  Skin: Skin is warm and dry.    ED Course  Procedures (including critical care time) Labs Review Labs Reviewed  CBC WITH DIFFERENTIAL - Abnormal; Notable for the following:    WBC 3.8 (*)    RBC 2.08 (*)    Hemoglobin 6.2 (*)    HCT 20.0 (*)    Platelets 72 (*)    Lymphs Abs 0.6 (*)    All other components within normal limits  POC OCCULT BLOOD, ED  TYPE AND SCREEN  PREPARE RBC (CROSSMATCH)     MDM   Final diagnoses:  Anemia, unspecified anemia type    Pt presents to the ED w/ low hemoglobin of 5.9 that was noted while at dialysis today. CBC in the ED reveals Hgb of 6.2 and patient is asymptomatic. Pt endorses fatigue and lt side head x 1 week. Denies blood in stools,  SOB, and dizziness. DDx for low hemoglobin include anemia of chronic disease ( iron studies on 09/2013 revealed low normal iron and increased ferritin; also hx of EPO injections) and GI bleed w/ hx of abdominal pain although denies bloody BM and no longer having abdominal pain. Will type and screen patient as well as get a stool guaiac.  Stool guaiac negative, will start transfusion of 2 units RBCs, hospitalist to admit to tele.    Julious Oka, MD 08/21/14 [phone]

## 2014-08-21 NOTE — H&P (Signed)
[facility] Admission History and Physical       Cameron Mendez:[id number] DOB: [birth year] DOA: 08/21/2014  Referring physician:  EDP PCP: [name], MD  Specialists:   Chief Complaint:  Abnormal Labs  HPI: Cameron Mendez is a 68 y.o. male with ESRD on HD, Diastolic Dysfunction, COPD, HTN, Anemia and Thrombocytopenia who presents to the ED with complaints of abnormal labs which were found at dialysis today.  He reports that the dialysis doctors have been watching his hemoglobin level for the past few weeks and it has been trending down.   Today his hemoglobin was found to be 5.9 at the dialysis center and after his dialysis treatment he was sent to the ED.   He reports having weakness and fatigue for the past week , and denies having any melena or hematochezia.  He denies having any Chest pain , SOB, ABD pain or nausea , vomiting, or hematemesis.    In the ED the FOBT was HEME Negative and repeat hemoglobin level was 6.2.      Patient reports having his last Colonoscopy 2 years ago  and it was performed by Dr Oletta Lamas, he also reports historically having thrombocytopenia and was seen in the past by Dr. Jamse Arn of Heme/Onc.  His platelets were 72 tonight.      Review of Systems:  Constitutional: No Weight Loss, No Weight Gain, Night Sweats, Fevers, Chills, Dizziness,+Fatigue, +Generalized Weakness HEENT: No Headaches, Difficulty Swallowing,Tooth/Dental Problems,Sore Throat,  No Sneezing, Rhinitis, Ear Ache, Nasal Congestion, or Post Nasal Drip,  Cardio-vascular:  No Chest pain, Orthopnea, PND, Edema in Lower Extremities, Anasarca, Dizziness, Palpitations  Resp: No Dyspnea, No DOE, No Productive Cough, No Non-Productive Cough, No Hemoptysis, No Wheezing.    GI: No Heartburn, Indigestion, Abdominal Pain, Nausea, Vomiting, Diarrhea, Hematemesis, Hematochezia, Melena, Change in Bowel Habits,  Loss of Appetite  GU: No Dysuria, Change in Color of Urine, No Urgency or Frequency, No  Flank pain.  Musculoskeletal: No Joint Pain or Swelling, No Decreased Range of Motion, No Back Pain.  Neurologic: No Syncope, No Seizures, Muscle Weakness, Paresthesia, Vision Disturbance or Loss, No Diplopia, No Vertigo, No Difficulty Walking,  Skin: No Rash or Lesions. Psych: No Change in Mood or Affect, No Depression or Anxiety, No Memory loss, No Confusion, or Hallucinations   Past Medical History  Diagnosis Date  . Anemia   . Diastolic dysfunction   . Retinitis pigmentosa     Blindness--has shunt --placed yrs ago in [place]..  . Arthritis     Gout  . Hypertension     dx--"long time"  . CKD (chronic kidney disease), stage IV     a. L upper extremity AV fistula created 07/2012.  [name] BPH (benign prostatic hyperplasia)   . Blind   . Gangrene of finger     a. 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  . Colon polyps   . Pericardial effusion     a. Noted on echo 10/2009. b. Again seen on echo 09/2013.  [name] CHF (congestive heart failure)       Past Surgical History  Procedure Laterality Date  . Eye surgery    . Knee ligament reconstruction      left  . Av fistula placement  07/15/2012    Procedure: ARTERIOVENOUS (AV) FISTULA CREATION;  Surgeon: Rosetta Posner, MD;  Location: [place];  Service: Vascular;  Laterality: Left;  . Amputation  12/30/2012    Procedure: AMPUTATION DIGIT;  Surgeon: Tennis Must, MD;  Location: [place];  Service: Orthopedics;  Laterality: Left;  LEFT SMALL FINGER AMPUTATION       Prior to Admission medications   Medication Sig Start Date End Date Taking? Authorizing Provider  acetaminophen (TYLENOL) 500 MG tablet Take 1,000 mg by mouth every 6 (six) hours as needed for moderate pain.   Yes Historical Provider, MD  allopurinol (ZYLOPRIM) 100 MG tablet Take 100 mg by mouth daily.    Yes Historical Provider, MD  amLODipine (NORVASC) 10 MG tablet Take 10 mg by mouth every evening.    Yes Historical Provider, MD  aspirin 81 MG tablet Take 81  mg by mouth every morning.    Yes Historical Provider, MD  bisacodyl (DULCOLAX) 5 MG EC tablet Take 5 mg by mouth daily as needed for moderate constipation.   Yes Historical Provider, MD  calcium acetate (PHOSLO) 667 MG capsule Take 1 capsule (667 mg total) by mouth 3 (three) times daily with meals. 09/26/13  Yes [name], MD  carvedilol (COREG) 25 MG tablet Take 25 mg by mouth 2 (two) times daily with a meal.   Yes Historical Provider, MD  clotrimazole-betamethasone (LOTRISONE) cream Apply 1 application topically 2 (two) times daily as needed (for rash).    Yes Historical Provider, MD  colchicine 0.6 MG tablet Take 0.6 mg by mouth daily as needed. For gout   Yes Historical Provider, MD  diphenhydrAMINE (BENADRYL) 25 MG tablet Take 25-50 mg by mouth every 6 (six) hours as needed for itching or allergies.    Yes Historical Provider, MD  Tiotropium Bromide Monohydrate (SPIRIVA RESPIMAT) 2.5 MCG/ACT AERS Inhale 2 puffs into the lungs every morning. 08/04/14  Yes [name], MD      Allergies  Allergen Reactions  . Ibuprofen Other (See Comments)    Kidney problems-dialysis patient  . Nsaids Other (See Comments)    Kidney problems-dialysis patient     Social History:  reports that he has been smoking Cigarettes.  He has a 25 pack-year smoking history. He has never used smokeless tobacco. He reports that he does not drink alcohol or use illicit drugs.     Family History  Problem Relation Age of Onset  . Cancer Mother     OVARIAN       Physical Exam:  GEN:  Pleasant Well Nourished and Well Developed 68 y.o. male examined and in no acute distress; cooperative with exam Filed Vitals:   08/21/14 1915 08/21/14 1930 08/21/14 1945 08/21/14 2000  BP: 119/46 124/38 121/44 120/41  Pulse: 69     Temp:      TempSrc:      Resp: 13 15 16 17   SpO2: 99%   98%   Blood pressure 120/41, pulse 69, temperature 97.9 F (36.6 C), temperature source Oral, resp. rate 17, SpO2  98.00%. PSYCH: He is alert and oriented x4; does not appear anxious does not appear depressed; affect is normal HEENT: Normocephalic and Atraumatic, Mucous membranes pink; PERRLA; EOM intact; Fundi:  Benign;  No scleral icterus, Nares: Patent, Oropharynx: Clear, Fair Dentition,    Neck:  FROM, No Cervical Lymphadenopathy nor Thyromegaly or Carotid Bruit; No JVD; Breasts:: Not examined CHEST WALL: No tenderness CHEST: Normal respiration, clear to auscultation bilaterally HEART: Regular rate and rhythm; no murmurs rubs or gallops BACK: No kyphosis or scoliosis; No CVA tenderness ABDOMEN: Positive Bowel Sounds, Soft Non-Tender; No Masses, No Organomegaly. Rectal Exam: Not done EXTREMITIES: No Cyanosis, Clubbing, or Edema; No Ulcerations. Genitalia: not examined  PULSES: 2+ and symmetric SKIN: Normal hydration no rash or ulceration CNS: Alert and Oriented x 4,  No Focal Deficits  Vascular: pulses palpable throughout    Labs on Admission:  Basic Metabolic Panel: No results found for this basename: NA, K, CL, CO2, GLUCOSE, BUN, CREATININE, CALCIUM, MG, PHOS,  in the last 168 hours Liver Function Tests: No results found for this basename: AST, ALT, ALKPHOS, BILITOT, PROT, ALBUMIN,  in the last 168 hours No results found for this basename: LIPASE, AMYLASE,  in the last 168 hours No results found for this basename: AMMONIA,  in the last 168 hours CBC:  Recent Labs Lab 08/21/14 1715  WBC 3.8*  NEUTROABS 2.8  HGB 6.2*  HCT 20.0*  MCV 96.2  PLT 72*   Cardiac Enzymes: No results found for this basename: CKTOTAL, CKMB, CKMBINDEX, TROPONINI,  in the last 168 hours  BNP (last 3 results)  Recent Labs  09/17/13 0425 09/17/13 0747 04/27/14 0600  PROBNP 7483.0* 8483.0* 8130.0*   CBG: No results found for this basename: GLUCAP,  in the last 168 hours  Radiological Exams on Admission: No results found.    Assessment/Plan:   68 y.o. male with   Principal Problem:   1.   Anemia-  Due to Occult Bleeding, vs Production Problem   Transfuse 1 unit tonight , and possible 2 units with dialysis   Stop Aspirin Rx   Monitor H/Hs   May need GI evaluation and possible Hematology Consults has seen both in past    GI: Dr Oletta Lamas ( Last Colonoscopy 2 years ago),  Previously seen by Hematology : Dr Jamse Arn in the past     Active Problems:   2.   Other pancytopenia   Monitor trend of blood cell lines   H/Hs every 8 hrs     3.   Essential hypertension, benign   Continue Carvedilol, and Amlodipine Rx   IV Hydralazine PRN SBP > 160     4.   ESRD on hemodialysis- MWF schedule   Notify dialysis Team     5.   COPD (chronic obstructive pulmonary disease)   Continue Spiriva   Monitor O2     6.   Diastolic dysfunction   Continue Carvedilol   BP control   Continue Dialysis on MWF     7.   Gout, unspecified   Stable   Continue Allopurinol     8.   Tobacco Use Disorder-   Tobacco Cessation discussed   Nicotine patch daily      9.   DVT Prophylaxis   SCDS due to Thrombocytopenia     Code Status:    FULL CODE Family Communication:    Wife and Daughter at Bedside Disposition Plan:    Inpatient   Time spent:  [facility] Pager [phone]   If Bray Please Contact the Day Rounding Team MD for [facility]  If 7PM-7AM, Please Contact night-coverage  [email or web address] Password [facility] 08/21/2014, 8:29 PM

## 2014-08-21 NOTE — ED Notes (Signed)
Attempted to call report

## 2014-08-21 NOTE — ED Notes (Addendum)
Per EMS- Pt comes from dialysis where they wanted him checkout for low hemoglobin of 5.9. Reports that he has been c/o abdominal pain for about 1 week. Pt is blind, denies pain at current. Is a x 4. BP 128/48, HR 70. Had regular dialysis session today.

## 2014-08-22 LAB — BASIC METABOLIC PANEL
Anion gap: 11 (ref 5–15)
BUN: 13 mg/dL (ref 6–23)
CO2: 31 meq/L (ref 19–32)
CREATININE: 3.6 mg/dL — AB (ref 0.50–1.35)
Calcium: 8.6 mg/dL (ref 8.4–10.5)
Chloride: 98 mEq/L (ref 96–112)
GFR calc Af Amer: 19 mL/min — ABNORMAL LOW (ref >90)
GFR, EST NON AFRICAN AMERICAN: 16 mL/min — AB (ref >90)
GLUCOSE: 91 mg/dL (ref 70–99)
Potassium: 3.5 mEq/L — ABNORMAL LOW (ref 3.7–5.3)
SODIUM: 140 meq/L (ref 137–147)

## 2014-08-22 LAB — CBC
HEMATOCRIT: 23.8 % — AB (ref 39.0–52.0)
Hemoglobin: 7.5 g/dL — ABNORMAL LOW (ref 13.0–17.0)
MCH: 29.3 pg (ref 26.0–34.0)
MCHC: 31.5 g/dL (ref 30.0–36.0)
MCV: 93 fL (ref 78.0–100.0)
Platelets: 71 10*3/uL — ABNORMAL LOW (ref 150–400)
RBC: 2.56 MIL/uL — ABNORMAL LOW (ref 4.22–5.81)
RDW: 16.6 % — ABNORMAL HIGH (ref 11.5–15.5)
WBC: 3.9 10*3/uL — AB (ref 4.0–10.5)

## 2014-08-22 LAB — HEMOGLOBIN AND HEMATOCRIT, BLOOD
HCT: 24.9 % — ABNORMAL LOW (ref 39.0–52.0)
Hemoglobin: 8 g/dL — ABNORMAL LOW (ref 13.0–17.0)

## 2014-08-22 MED ORDER — DARBEPOETIN ALFA-POLYSORBATE 200 MCG/0.4ML IJ SOLN
200.0000 ug | INTRAMUSCULAR | Status: DC
  Administered 2014-08-24: 200 ug via INTRAVENOUS
  Filled 2014-08-22: qty 0.4

## 2014-08-22 MED ORDER — SODIUM CHLORIDE 0.9 % IV SOLN
125.0000 mg | INTRAVENOUS | Status: DC
  Administered 2014-08-24: 125 mg via INTRAVENOUS
  Filled 2014-08-22: qty 10

## 2014-08-22 MED ORDER — DOXERCALCIFEROL 4 MCG/2ML IV SOLN
1.0000 ug | INTRAVENOUS | Status: DC
  Administered 2014-08-24: 1 ug via INTRAVENOUS
  Filled 2014-08-22: qty 2

## 2014-08-22 NOTE — Progress Notes (Signed)
PROGRESS NOTE  SALAM MICUCCI LNL:[id number] DOB: 01/30/1946 DOA: 08/21/2014 PCP: [name], MD  Assessment/Plan: Anemia- ? Etiology -no Fe studies done before transfusions S/p 1 unit PRBC -patient denies blood in stool (but is blind) -heme test stools  Stop Aspirin Rx  Monitor H/Hs  May need GI evaluation and possible Hematology Consults has seen both in past  GI: Dr Oletta Lamas ( Last Colonoscopy 2 years ago), Previously seen by Hematology : Dr Jamse Arn in the past - can not find notes -denies abdominal pain   Other pancytopenia  Monitor trend of blood cell lines  H/Hs every  12 hours ? From liver steatosis- seen on CT scan on 2010   Essential hypertension, benign  Continue Carvedilol, and Amlodipine Rx  IV Hydralazine PRN SBP > 160   ESRD on hemodialysis- MWF schedule  -nephrology consult  COPD (chronic obstructive pulmonary disease)  Continue Spiriva  Monitor O2    Diastolic dysfunction  Continue Carvedilol  BP control  Continue Dialysis on MWF   Gout, unspecified  Stable  Continue Allopurinol   Tobacco Use Disorder-  Tobacco Cessation discussed  Nicotine patch daily     Code Status: full Family Communication:  Disposition Plan:    Consultants:    Procedures:      HPI/Subjective: Eating breakfast    Objective: Filed Vitals:   08/22/14 0500  BP: 123/36  Pulse: 68  Temp: 97.9 F (36.6 C)  Resp: 17    Intake/Output Summary (Last 24 hours) at 08/22/14 0927 Last data filed at 08/22/14 0200  Gross per 24 hour  Intake    301 ml  Output      0 ml  Net    301 ml   Filed Weights   08/21/14 2032  Weight: 65.409 kg (144 lb 3.2 oz)    Exam:   General:  Pleasant/cooperative  Cardiovascular: rrr  Respiratory: clear  Abdomen: +Bs, soft   Data Reviewed: Basic Metabolic Panel:  Recent Labs Lab 08/22/14 0640  NA 140  K 3.5*  CL 98  CO2 31  GLUCOSE 91  BUN 13  CREATININE 3.60*  CALCIUM 8.6   Liver Function Tests: No  results found for this basename: AST, ALT, ALKPHOS, BILITOT, PROT, ALBUMIN,  in the last 168 hours No results found for this basename: LIPASE, AMYLASE,  in the last 168 hours No results found for this basename: AMMONIA,  in the last 168 hours CBC:  Recent Labs Lab 08/21/14 1715 08/21/14 2130 08/22/14 0640  WBC 3.8*  --  3.9*  NEUTROABS 2.8  --   --   HGB 6.2* 6.6* 7.5*  HCT 20.0* 20.5* 23.8*  MCV 96.2  --  93.0  PLT 72*  --  71*   Cardiac Enzymes: No results found for this basename: CKTOTAL, CKMB, CKMBINDEX, TROPONINI,  in the last 168 hours BNP (last 3 results)  Recent Labs  09/17/13 0425 09/17/13 0747 04/27/14 0600  PROBNP 7483.0* 8483.0* 8130.0*   CBG: No results found for this basename: GLUCAP,  in the last 168 hours  No results found for this or any previous visit (from the past 240 hour(s)).   Studies: No results found.  Scheduled Meds: . sodium chloride  10 mL/hr Intravenous Once  . allopurinol  100 mg Oral Daily  . amLODipine  10 mg Oral QPM  . calcium acetate  667 mg Oral TID WC  . carvedilol  25 mg Oral BID WC  . pantoprazole  40 mg Oral Daily  . sodium  chloride  3 mL Intravenous Q12H  . tiotropium  18 mcg Inhalation Daily   Continuous Infusions:  Antibiotics Given (last 72 hours)   None      Principal Problem:   Anemia Active Problems:   Gout, unspecified   BLINDNESS, LEGAL, [place] DEFINITION   Essential hypertension, benign   ESRD on hemodialysis   COPD (chronic obstructive pulmonary disease)   Other pancytopenia   Diastolic dysfunction    Time spent: 35 min    VANN, JESSICA  [facility] Pager [phone] If 7PM-7AM, please contact night-coverage at [email or web address], password [facility] 08/22/2014, 9:27 AM  LOS: 1 day

## 2014-08-22 NOTE — Consult Note (Signed)
[facility] Renal Consultation Note    Indication for Consultation:  Management of ESRD/hemodialysis; anemia, hypertension/volume and secondary hyperparathyroidism PCP:  HPI: Cameron Mendez is a 68 y.o. male with ESRD secondary to HTN on HD since 09/2013, hx pericardiac effusion with neg cytology 10./2014, COPD/tobacco abuse and chronic thrombocytopenia (70-80K) was sent to the ED due to declining Hgb the lowest of which was 6.0 9/9.  Review of historical Hgb show Hgb in the 10 range June and July and through August 12  while on Aranesp 200 which was stopped 06/10/2014.  Hgb remarkably remained stable for 6 weeks following cessation of Aranesp then Hgb started decline to 9.2 8/19, 8.1 8/26, 7/7 9/2 and 6.0 9/9. Stat Hgb 9/11 was 5.9   Aranesp was not resumed until 8/26 at 40/week.  It was increased to 100 on 9/9.  Tsat was 21% 8/26 and venofer 100 x 1 was started 9/2.  Of note was that between May and July WBC ran 5-7 K and platelets were 70 - 90 K; platelets were 61L 08/04/2014.Cameron Mendez  PT denies blood in stool (he is blind). FOBT in the ED was heme neg.  Repeat Hgb in the [facility] ED was 6.2 post HD. He has been weak, has abdominal pain at times, dizzy at times (ambulates by himself), but denies SOB or CP. Appetite is good.  Cramps at times on HD, but got to EDW on Friday with net UF of 2.8  Chart Review: 09/2009 - SOB, acute / chronic diast HF, pulm edema, acute/CRF, HTN crisis, EF 70%, chronic thrombocytopenia, legal blind d/t RP, hx ventriculostomy shunt, anemia, gout, DJD > rx with IV lasix, BP meds, for OP stress test after dc 09/2013 - SOB, acute pulm edema, hypoxemia, CKD IV, large pericardial effusion > rx with IV lasix and pericardiocentesis. Didn't improved so was started on hemodialysis. Improved. CP post pericardiocentesis rx'd with colchicine 04/2014 - SOB/ vol excess / pulm edema > admit, rx with serial HD, down 9kg from admit. Also COPD flare rx by pulm team. Dry wt lowered.    Past  Medical History  Diagnosis Date  . Anemia   . Diastolic dysfunction   . Retinitis pigmentosa     Blindness--has shunt --placed yrs ago in [place]..  . Arthritis     Gout  . Hypertension     dx--"long time"  . CKD (chronic kidney disease), stage IV     a. L upper extremity AV fistula created 07/2012.  Cameron Mendez BPH (benign prostatic hyperplasia)   . Blind   . Gangrene of finger     a. 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  . Colon polyps   . Pericardial effusion     a. Noted on echo 10/2009. b. Again seen on echo 09/2013.  Cameron Mendez CHF (congestive heart failure)    Past Surgical History  Procedure Laterality Date  . Eye surgery    . Knee ligament reconstruction      left  . Av fistula placement  07/15/2012    Procedure: ARTERIOVENOUS (AV) FISTULA CREATION;  Surgeon: Rosetta Posner, MD;  Location: [place];  Service: Vascular;  Laterality: Left;  . Amputation  12/30/2012    Procedure: AMPUTATION DIGIT;  Surgeon: Tennis Must, MD;  Location: [place] [place];  Service: Orthopedics;  Laterality: Left;  LEFT SMALL FINGER AMPUTATION   Family History  Problem Relation Age of Onset  . Cancer Mother     OVARIAN   Social History:  reports  that he has been smoking Cigarettes.  He has a 25 pack-year smoking history. He has never used smokeless tobacco. He reports that he does not drink alcohol or use illicit drugs. Allergies  Allergen Reactions  . Ibuprofen Other (See Comments)    Kidney problems-dialysis patient  . Nsaids Other (See Comments)    Kidney problems-dialysis patient   Prior to Admission medications   Medication Sig Start Date End Date Taking? Authorizing Provider  acetaminophen (TYLENOL) 500 MG tablet Take 1,000 mg by mouth every 6 (six) hours as needed for moderate pain.   Yes Historical Provider, MD  allopurinol (ZYLOPRIM) 100 MG tablet Take 100 mg by mouth daily.    Yes Historical Provider, MD  amLODipine (NORVASC) 10 MG tablet Take 10 mg by mouth every evening.    Yes  Historical Provider, MD  aspirin 81 MG tablet Take 81 mg by mouth every morning.    Yes Historical Provider, MD  bisacodyl (DULCOLAX) 5 MG EC tablet Take 5 mg by mouth daily as needed for moderate constipation.   Yes Historical Provider, MD  calcium acetate (PHOSLO) 667 MG capsule Take 1 capsule (667 mg total) by mouth 3 (three) times daily with meals. 09/26/13  Yes [name], MD  carvedilol (COREG) 25 MG tablet Take 25 mg by mouth 2 (two) times daily with a meal.   Yes Historical Provider, MD  clotrimazole-betamethasone (LOTRISONE) cream Apply 1 application topically 2 (two) times daily as needed (for rash).    Yes Historical Provider, MD  colchicine 0.6 MG tablet Take 0.6 mg by mouth daily as needed. For gout   Yes Historical Provider, MD  diphenhydrAMINE (BENADRYL) 25 MG tablet Take 25-50 mg by mouth every 6 (six) hours as needed for itching or allergies.    Yes Historical Provider, MD  Tiotropium Bromide Monohydrate (SPIRIVA RESPIMAT) 2.5 MCG/ACT AERS Inhale 2 puffs into the lungs every morning. 08/04/14  Yes [name], MD   Current Facility-Administered Medications  Medication Dose Route Frequency Provider Last Rate Last Dose  . 0.9 %  sodium chloride infusion  10 mL/hr Intravenous Once Julious Oka, MD      . 0.9 %  sodium chloride infusion  250 mL Intravenous PRN [name], MD      . acetaminophen (TYLENOL) tablet 650 mg  650 mg Oral Q6H PRN [name], MD       Or  . acetaminophen (TYLENOL) suppository 650 mg  650 mg Rectal Q6H PRN [name], MD      . allopurinol (ZYLOPRIM) tablet 100 mg  100 mg Oral Daily [name], MD   100 mg at 08/22/14 1017  . amLODipine (NORVASC) tablet 10 mg  10 mg Oral QPM [name], MD   10 mg at 08/21/14 2249  . bisacodyl (DULCOLAX) EC tablet 5 mg  5 mg Oral Daily PRN [name], MD      . calcium acetate (PHOSLO) capsule 667 mg  667 mg Oral TID WC [name], MD   667 mg at 08/22/14 0842   . carvedilol (COREG) tablet 25 mg  25 mg Oral BID WC [name], MD   25 mg at 08/22/14 1017  . diphenhydrAMINE (BENADRYL) tablet 25-50 mg  25-50 mg Oral Q6H PRN [name], MD      . HYDROmorphone (DILAUDID) injection 0.5-1 mg  0.5-1 mg Intravenous Q3H PRN [name], MD      . ondansetron [facility]) tablet  4 mg  4 mg Oral Q6H PRN [name], MD       Or  . ondansetron (ZOFRAN) injection 4 mg  4 mg Intravenous Q6H PRN [name], MD      . oxyCODONE (Oxy IR/ROXICODONE) immediate release tablet 5 mg  5 mg Oral Q4H PRN [name], MD      . pantoprazole (PROTONIX) EC tablet 40 mg  40 mg Oral Daily [name], MD   40 mg at 08/21/14 2259  . sodium chloride 0.9 % injection 3 mL  3 mL Intravenous Q12H [name], MD   3 mL at 08/22/14 1000  . sodium chloride 0.9 % injection 3 mL  3 mL Intravenous PRN [name], MD      . tiotropium [place]) inhalation capsule 18 mcg  18 mcg Inhalation Daily [name], MD       Labs: Basic Metabolic Panel:  Recent Labs Lab 08/22/14 0640  NA 140  K 3.5*  CL 98  CO2 31  GLUCOSE 91  BUN 13  CREATININE 3.60*  CALCIUM 8.6  CBC:  Recent Labs Lab 08/21/14 1715 08/21/14 2130 08/22/14 0640  WBC 3.8*  --  3.9*  NEUTROABS 2.8  --   --   HGB 6.2* 6.6* 7.5*  HCT 20.0* 20.5* 23.8*  MCV 96.2  --  93.0  PLT 72*  --  71*    ROS: As per HPI otherwise negative.  Physical Exam: Filed Vitals:   08/22/14 0021 08/22/14 0139 08/22/14 0200 08/22/14 0500  BP: 120/37 133/35 122/35 123/36  Pulse: 67 69 67 68  Temp: 98.2 F (36.8 C) 97.9 F (36.6 C) 98.1 F (36.7 C) 97.9 F (36.6 C)  TempSrc: Oral Oral Tympanic Oral  Resp: 17 16 17 17   Height:      Weight:      SpO2: 93% 94% 93% 94%     General: Well developed, well nourished, alert in no acute distress. Head: Normocephalic, atraumatic, sclera non-icteric, mucus membranes are moist, blind Neck: Supple.  Lungs: Clear  bilaterally to auscultation without wheezes, rales, or rhonchi. Breathing is unlabored. Heart: RRR with S1 S2.  Abdomen: Soft, non-tender, non-distended with normoactive bowel sounds. No rebound/guarding.  Lower extremities: leathery skin without edema , elongated nails Neuro: Alert and oriented X 3. Moves all extremities spontaneously. Psych:  Responds to questions appropriately with a normal affect. Dialysis Access: left upper AVF patent - distal site scab came off when I removed dressing so replaced and asked RN to remove in a little while.  Dialysis Orders: MWF [place] 180 4 hr 400/A 1.5 2K 2.25 Ca profile 4 Var Na EDW 66.5 AVF Aranesp 100 q HD started 9/9 - up from 40 on 8/26, venofer 100 mg IV q HD x 10  Recent labs;  tsat 21% 8/26 and ferritin 760  Assessment/Plan: 1. Anemia - Hgb 6s on admission up to 7.5 after transfusion on 1 unit PRBC; stool heme neg - very well could have a delayed response to the cessation of Aranesp - he had been on 200 (max dose previously in order to sustain Hgb in the 10s - it was stopped 7/1.  He seems to have an overall pancytopenia, with platelets at the lower end of his usual range; I would favor max Aranesp dose until he starts trending up, redose Monday if still here, continue IV Fe; If Hgb < 7.3 tomorrow - favor transfusing another unit PRBC - he should be  able to tolerate K and volume load WBC is lower than usual, but platelets have been 40-80 K since 2010 2. ESRD -  MWF K 3.5 3. Hypertension/volume  - controlled/ EDW seems ok for him 4. Metabolic bone disease -  Continue hectorol 1 5. Nutrition - renal diet 6. COPD/tobacco abuse - needs cessation   Myriam Jacobson, PA-C [place] [phone] 08/22/2014, 12:57 PM     Pt seen, examined, agree w assess/plan as above. Will follow.  Kelly Splinter MD pager [phone]    cell [phone] 08/22/2014, 8:31 PM

## 2014-08-22 NOTE — Progress Notes (Signed)
Blood transfusion completed at 0201.  No sign of reaction noted. VS stable.

## 2014-08-23 LAB — CBC
HEMATOCRIT: 25 % — AB (ref 39.0–52.0)
Hemoglobin: 8 g/dL — ABNORMAL LOW (ref 13.0–17.0)
MCH: 29.3 pg (ref 26.0–34.0)
MCHC: 32 g/dL (ref 30.0–36.0)
MCV: 91.6 fL (ref 78.0–100.0)
Platelets: 64 10*3/uL — ABNORMAL LOW (ref 150–400)
RBC: 2.73 MIL/uL — ABNORMAL LOW (ref 4.22–5.81)
RDW: 16.4 % — ABNORMAL HIGH (ref 11.5–15.5)
WBC: 4.1 10*3/uL (ref 4.0–10.5)

## 2014-08-23 LAB — HEMOGLOBIN AND HEMATOCRIT, BLOOD
HCT: 22.5 % — ABNORMAL LOW (ref 39.0–52.0)
HEMATOCRIT: 21.2 % — AB (ref 39.0–52.0)
HEMOGLOBIN: 7 g/dL — AB (ref 13.0–17.0)
Hemoglobin: 7.3 g/dL — ABNORMAL LOW (ref 13.0–17.0)

## 2014-08-23 LAB — PREPARE RBC (CROSSMATCH)

## 2014-08-23 LAB — LACTATE DEHYDROGENASE: LDH: 196 U/L (ref 94–250)

## 2014-08-23 LAB — BASIC METABOLIC PANEL
Anion gap: 15 (ref 5–15)
BUN: 25 mg/dL — AB (ref 6–23)
CALCIUM: 8.4 mg/dL (ref 8.4–10.5)
CHLORIDE: 92 meq/L — AB (ref 96–112)
CO2: 26 meq/L (ref 19–32)
CREATININE: 4.91 mg/dL — AB (ref 0.50–1.35)
GFR calc Af Amer: 13 mL/min — ABNORMAL LOW (ref >90)
GFR calc non Af Amer: 11 mL/min — ABNORMAL LOW (ref >90)
Glucose, Bld: 90 mg/dL (ref 70–99)
Potassium: 3.5 mEq/L — ABNORMAL LOW (ref 3.7–5.3)
Sodium: 133 mEq/L — ABNORMAL LOW (ref 137–147)

## 2014-08-23 LAB — GLUCOSE, CAPILLARY
GLUCOSE-CAPILLARY: 91 mg/dL (ref 70–99)
GLUCOSE-CAPILLARY: 105 mg/dL — AB (ref 70–99)
Glucose-Capillary: 90 mg/dL (ref 70–99)

## 2014-08-23 MED ORDER — SODIUM CHLORIDE 0.9 % IV SOLN
Freq: Once | INTRAVENOUS | Status: DC
Start: 2014-08-23 — End: 2014-08-24

## 2014-08-23 NOTE — Progress Notes (Signed)
  [facility] Progress Note   Subjective: Hb down today to 7.3, got one unit prbc's yest   Filed Vitals:   08/22/14 1417 08/22/14 2014 08/23/14 0020 08/23/14 0400  BP: 125/52 134/46 132/50 112/44  Pulse: 67 63 68 60  Temp: 97.4 F (36.3 C) 98.8 F (37.1 C) 98.4 F (36.9 C) 98.7 F (37.1 C)  TempSrc: Axillary Oral Oral Oral  Resp: 18 15 17 18   Height:      Weight:      SpO2: 100% 98% 99% 96%   Exam: Alert, no distress No jvd Chest occ scattered wheezes, upper airway noises, no rales or bronchial BS, no ^WOB RRR no MRG Abd soft, NTND, +BS No LE edema Neuro is Ox 3, nf LUA AVF patent  HD: MWF [place] 4h   66.5kg   2/2.25 [place]   400/A1.5   AVF LUA    Heparin ? Aranesp 100 ug weekly started 9/9, Venofer 100 mg q HD x 10 ?stop date   Assessment: 1 Anemia unclear cause , guiac neg, s/p 1u prbc 2 ESRD on HD 3 Chronic thrombocytopenia 4 HTN on amlodipine, coreg, at dry wt 5 COPD 6 Tobacco use 7 Bilat blindness d/t RP  Plan- HD tomorrow, will transfuse 2 u prbc with HD tomorrow    Kelly Splinter MD  pager [phone]    cell [phone]  08/23/2014, 9:19 AM     Recent Labs Lab 08/22/14 0640 08/23/14  NA 140 133*  K 3.5* 3.5*  CL 98 92*  CO2 31 26  GLUCOSE 91 90  BUN 13 25*  CREATININE 3.60* 4.91*  CALCIUM 8.6 8.4   No results found for this basename: AST, ALT, ALKPHOS, BILITOT, PROT, ALBUMIN,  in the last 168 hours  Recent Labs Lab 08/21/14 1715  08/22/14 0640 08/22/14 1531 08/23/14 08/23/14 0400  WBC 3.8*  --  3.9*  --  4.1  --   NEUTROABS 2.8  --   --   --   --   --   HGB 6.2*  < > 7.5* 8.0* 8.0* 7.3*  HCT 20.0*  < > 23.8* 24.9* 25.0* 22.5*  MCV 96.2  --  93.0  --  91.6  --   PLT 72*  --  71*  --  64*  --   < > = values in this interval not displayed. . sodium chloride  10 mL/hr Intravenous Once  . allopurinol  100 mg Oral Daily  . amLODipine  10 mg Oral QPM  . calcium acetate  667 mg Oral TID WC  . carvedilol  25 mg Oral BID WC  .  [START ON 08/24/2014] darbepoetin (ARANESP) injection - DIALYSIS  200 mcg Intravenous Q Mon-HD  . [START ON 08/24/2014] doxercalciferol  1 mcg Intravenous Q M,W,F-HD  . [START ON 08/24/2014] ferric gluconate (FERRLECIT/NULECIT) IV  125 mg Intravenous Q M,W,F-HD  . pantoprazole  40 mg Oral Daily  . sodium chloride  3 mL Intravenous Q12H  . tiotropium  18 mcg Inhalation Daily     sodium chloride, acetaminophen, acetaminophen, bisacodyl, diphenhydrAMINE, HYDROmorphone (DILAUDID) injection, ondansetron (ZOFRAN) IV, ondansetron, oxyCODONE, sodium chloride

## 2014-08-23 NOTE — Progress Notes (Signed)
Utilization review completed.  

## 2014-08-23 NOTE — ED Provider Notes (Signed)
.  Face to face Exam:  General:  A&Ox3 HEENT:  Atraumatic Resp:  Normal effort Abd:  Nondistended Neuro:No focal deficits  CRITICAL CARE Performed by: Leonard Schwartz L Total critical care time: 30 min Critical care time was exclusive of separately billable procedures and treating other patients. Critical care was necessary to treat or prevent imminent or life-threatening deterioration. Critical care was time spent personally by me on the following activities: development of treatment plan with patient and/or surrogate as well as nursing, discussions with consultants, evaluation of patient's response to treatment, examination of patient, obtaining history from patient or surrogate, ordering and performing treatments and interventions, ordering and review of laboratory studies, ordering and review of radiographic studies, pulse oximetry and re-evaluation of patient's condition.   Dot Lanes, MD 08/23/14 1026

## 2014-08-23 NOTE — Progress Notes (Signed)
PROGRESS NOTE  MECHEL SCHUTTER HUT:[id number] DOB: [birth year] DOA: 08/21/2014 PCP: [name], MD  Assessment/Plan: Anemia- ? Etiology- ?anemia of CD -no Fe studies done before transfusions S/p 1 unit PRBC -patient denies blood in stool (but is blind) -heme test stools negative Stop Aspirin Rx  Monitor H/Hs  May need GI evaluation and possible Hematology Consults has seen both in past  GI: Dr Oletta Lamas ( Last Colonoscopy 2 years ago), Previously seen by Hematology : Dr Jamse Arn in the past - can not find notes -denies abdominal pain - Hgb dropped from 8 to 7.3 this AM-- ? Volume depletion - max aranesp dose per nephrology- appears this was stopped on 7/1   Other pancytopenia  Monitor trend of blood cell lines  ? From liver steatosis- seen on CT scan on 2010 -stable   Essential hypertension, benign  Continue Carvedilol, and Amlodipine Rx  IV Hydralazine PRN SBP > 160   ESRD on hemodialysis- MWF schedule  -nephrology consult  COPD (chronic obstructive pulmonary disease)  Continue Spiriva  Monitor O2    Diastolic dysfunction  Continue Carvedilol  BP control  Continue Dialysis on MWF   Gout, unspecified  Stable  Continue Allopurinol   Tobacco Use Disorder-  Tobacco Cessation discussed  Nicotine patch daily     Code Status: full Family Communication:  Disposition Plan: ? D/c tomm after dialysis   Consultants:  nephrology  Procedures:      HPI/Subjective: Eating breakfast No new c/o    Objective: Filed Vitals:   08/23/14 0400  BP: 112/44  Pulse: 60  Temp: 98.7 F (37.1 C)  Resp: 18    Intake/Output Summary (Last 24 hours) at 08/23/14 0820 Last data filed at 08/22/14 1826  Gross per 24 hour  Intake    360 ml  Output      0 ml  Net    360 ml   Filed Weights   08/21/14 2032  Weight: 65.409 kg (144 lb 3.2 oz)    Exam:   General:  Pleasant/cooperative  Cardiovascular: rrr  Respiratory: clear  Abdomen: +Bs, soft   Data  Reviewed: Basic Metabolic Panel:  Recent Labs Lab 08/22/14 0640 08/23/14  NA 140 133*  K 3.5* 3.5*  CL 98 92*  CO2 31 26  GLUCOSE 91 90  BUN 13 25*  CREATININE 3.60* 4.91*  CALCIUM 8.6 8.4   Liver Function Tests: No results found for this basename: AST, ALT, ALKPHOS, BILITOT, PROT, ALBUMIN,  in the last 168 hours No results found for this basename: LIPASE, AMYLASE,  in the last 168 hours No results found for this basename: AMMONIA,  in the last 168 hours CBC:  Recent Labs Lab 08/21/14 1715 08/21/14 2130 08/22/14 0640 08/22/14 1531 08/23/14 08/23/14 0400  WBC 3.8*  --  3.9*  --  4.1  --   NEUTROABS 2.8  --   --   --   --   --   HGB 6.2* 6.6* 7.5* 8.0* 8.0* 7.3*  HCT 20.0* 20.5* 23.8* 24.9* 25.0* 22.5*  MCV 96.2  --  93.0  --  91.6  --   PLT 72*  --  71*  --  64*  --    Cardiac Enzymes: No results found for this basename: CKTOTAL, CKMB, CKMBINDEX, TROPONINI,  in the last 168 hours BNP (last 3 results)  Recent Labs  09/17/13 0425 09/17/13 0747 04/27/14 0600  PROBNP 7483.0* 8483.0* 8130.0*   CBG: No results found for this basename: GLUCAP,  in the last  168 hours  No results found for this or any previous visit (from the past 240 hour(s)).   Studies: No results found.  Scheduled Meds: . sodium chloride  10 mL/hr Intravenous Once  . allopurinol  100 mg Oral Daily  . amLODipine  10 mg Oral QPM  . calcium acetate  667 mg Oral TID WC  . carvedilol  25 mg Oral BID WC  . [START ON 08/24/2014] darbepoetin (ARANESP) injection - DIALYSIS  200 mcg Intravenous Q Mon-HD  . [START ON 08/24/2014] doxercalciferol  1 mcg Intravenous Q M,W,F-HD  . [START ON 08/24/2014] ferric gluconate (FERRLECIT/NULECIT) IV  125 mg Intravenous Q M,W,F-HD  . pantoprazole  40 mg Oral Daily  . sodium chloride  3 mL Intravenous Q12H  . tiotropium  18 mcg Inhalation Daily   Continuous Infusions:  Antibiotics Given (last 72 hours)   None      Principal Problem:   Anemia Active Problems:    Gout, unspecified   BLINDNESS, LEGAL, [place] DEFINITION   Essential hypertension, benign   ESRD on hemodialysis   COPD (chronic obstructive pulmonary disease)   Other pancytopenia   Diastolic dysfunction    Time spent: 25 min    Shayle Donahoo  [facility] Pager [phone] If 7PM-7AM, please contact night-coverage at [email or web address], password [facility] 08/23/2014, 8:20 AM  LOS: 2 days

## 2014-08-24 DIAGNOSIS — H548 Legal blindness, as defined in USA: Secondary | ICD-10-CM

## 2014-08-24 LAB — HEMOGLOBIN AND HEMATOCRIT, BLOOD
HEMATOCRIT: 20.4 % — AB (ref 39.0–52.0)
Hemoglobin: 6.7 g/dL — CL (ref 13.0–17.0)

## 2014-08-24 LAB — HAPTOGLOBIN: Haptoglobin: 192 mg/dL (ref 45–215)

## 2014-08-24 LAB — BASIC METABOLIC PANEL
ANION GAP: 15 (ref 5–15)
BUN: 38 mg/dL — ABNORMAL HIGH (ref 6–23)
CHLORIDE: 93 meq/L — AB (ref 96–112)
CO2: 27 mEq/L (ref 19–32)
Calcium: 8.5 mg/dL (ref 8.4–10.5)
Creatinine, Ser: 6.27 mg/dL — ABNORMAL HIGH (ref 0.50–1.35)
GFR calc non Af Amer: 8 mL/min — ABNORMAL LOW (ref >90)
GFR, EST AFRICAN AMERICAN: 9 mL/min — AB (ref >90)
Glucose, Bld: 95 mg/dL (ref 70–99)
POTASSIUM: 3.5 meq/L — AB (ref 3.7–5.3)
Sodium: 135 mEq/L — ABNORMAL LOW (ref 137–147)

## 2014-08-24 LAB — CBC
HCT: 20.2 % — ABNORMAL LOW (ref 39.0–52.0)
Hemoglobin: 6.5 g/dL — CL (ref 13.0–17.0)
MCH: 29.6 pg (ref 26.0–34.0)
MCHC: 32.7 g/dL (ref 30.0–36.0)
MCV: 90.6 fL (ref 78.0–100.0)
PLATELETS: 61 10*3/uL — AB (ref 150–400)
RBC: 2.23 MIL/uL — ABNORMAL LOW (ref 4.22–5.81)
RDW: 15.8 % — AB (ref 11.5–15.5)
WBC: 3.7 10*3/uL — AB (ref 4.0–10.5)

## 2014-08-24 MED ORDER — SODIUM CHLORIDE 0.9 % IV SOLN: 125.0000 mg | INTRAVENOUS | Status: DC

## 2014-08-24 MED ORDER — PANTOPRAZOLE SODIUM 40 MG PO TBEC: 40.0000 mg | DELAYED_RELEASE_TABLET | Freq: Every day | ORAL | Status: DC

## 2014-08-24 MED ORDER — DOXERCALCIFEROL 4 MCG/2ML IV SOLN: 1.0000 ug | INTRAVENOUS | Status: DC

## 2014-08-24 MED ORDER — DARBEPOETIN ALFA-POLYSORBATE 200 MCG/0.4ML IJ SOLN
INTRAMUSCULAR | Status: AC
  Filled 2014-08-24: qty 0.4

## 2014-08-24 MED ORDER — DOXERCALCIFEROL 4 MCG/2ML IV SOLN
INTRAVENOUS | Status: AC
  Filled 2014-08-24: qty 2

## 2014-08-24 MED ORDER — DARBEPOETIN ALFA-POLYSORBATE 200 MCG/0.4ML IJ SOLN: 200.0000 ug | INTRAMUSCULAR | Status: DC

## 2014-08-24 NOTE — Progress Notes (Signed)
Hgb 6.5. Pt stable. Plan for transfusion in am in place.

## 2014-08-24 NOTE — Progress Notes (Signed)
CRITICAL VALUE ALERT  Critical value received:  Hgb 6.5  Date of notification:  08/24/14  Time of notification:  0228  Critical value read back:Yes.    Nurse who received alert:  Daune Perch RN-BC  MD notified (1st page):  Donald Siva, NP  Time of first page:  0231  MD notified (2nd page):  Time of second page:  Responding MD:  Donald Siva, NP  Time MD responded:  [phone]

## 2014-08-24 NOTE — Procedures (Signed)
Patient was seen on dialysis and the procedure was supervised. BFR 400 Via LUE AVF BP is 135/59.  Patient appears to be tolerating treatment well

## 2014-08-24 NOTE — Care Management Note (Unsigned)
    Page 1 of 1   08/24/2014     3:32:19 PM CARE MANAGEMENT NOTE 08/24/2014  Patient:  Cameron Mendez, Cameron Mendez   Account Number:  [id number]  Date Initiated:  08/24/2014  Documentation initiated by:  Hero Kulish  Subjective/Objective Assessment:   Pt adm on 08/21/14 with anemia, pancytopenia, and HTN.  PTA, pt independent, lives with wife.     Action/Plan:   Will follow for dc needs as pt progresses.   Anticipated DC Date:  08/25/2014   Anticipated DC Plan:  [place]  CM consult      Choice offered to / List presented to:             Status of service:  In process, will continue to follow Medicare Important Message given?  YES (If response is "NO", the following Medicare IM given date fields will be blank) Date Medicare IM given:  08/24/2014 Medicare IM given by:  Allysa Governale Date Additional Medicare IM given:   Additional Medicare IM given by:    Discharge Disposition:    Per UR Regulation:  Reviewed for med. necessity/level of care/duration of stay  If discussed at [place] of Stay Meetings, dates discussed:    Comments:

## 2014-08-24 NOTE — Discharge Summary (Signed)
Physician Discharge Summary  WILLIOM CEDAR WER:[id number] DOB: 1946-11-26 DOA: 08/21/2014  PCP: [name], MD  Admit date: 08/21/2014 Discharge date: 08/24/2014  Time spent: 35  minutes  Recommendations for Outpatient Follow-up:  1. Cbc 1 week 2. Continue HD 3. Referral to hematology- re panyctopenia  Discharge Diagnoses:  Principal Problem:   Anemia Active Problems:   Gout, unspecified   BLINDNESS, LEGAL, [place] DEFINITION   Essential hypertension, benign   ESRD on hemodialysis   COPD (chronic obstructive pulmonary disease)   Other pancytopenia   Diastolic dysfunction   Discharge Condition: improved  Diet recommendation: renal  Filed Weights   08/21/14 2032 08/24/14 0840 08/24/14 1305  Weight: 65.409 kg (144 lb 3.2 oz) 66.6 kg (146 lb 13.2 oz) 65.5 kg (144 lb 6.4 oz)    History of present illness:  Cameron Mendez is a 68 y.o. male with ESRD on HD, Diastolic Dysfunction, COPD, HTN, Anemia and Thrombocytopenia who presents to the ED with complaints of abnormal labs which were found at dialysis today. He reports that the dialysis doctors have been watching his hemoglobin level for the past few weeks and it has been trending down. Today his hemoglobin was found to be 5.9 at the dialysis center and after his dialysis treatment he was sent to the ED. He reports having weakness and fatigue for the past week , and denies having any melena or hematochezia. He denies having any Chest pain , SOB, ABD pain or nausea , vomiting, or hematemesis. In the ED the FOBT was HEME Negative and repeat hemoglobin level was 6.2.  Patient reports having his last Colonoscopy 2 years ago and it was performed by Dr Oletta Lamas, he also reports historically having thrombocytopenia and was seen in the past by Dr. Jamse Arn of Heme/Onc. His platelets were [place] [facility] Course:  Anemia- ? Etiology- ?anemia of CD  -no Fe studies done before transfusions  S/p 3 unit PRBC  -heme test stools negative   Hematology referral  - max aranesp dose per nephrology- appears this was stopped on 7/1   Other pancytopenia  Monitor trend of blood cell lines  ? From liver steatosis- seen on CT scan on 2010  -stable   Essential hypertension, benign  Continue Carvedilol, and Amlodipine Rx   ESRD on hemodialysis- MWF schedule  -nephrology consult   COPD (chronic obstructive pulmonary disease)  Continue Spiriva   Diastolic dysfunction  Continue Carvedilol  BP control  Continue Dialysis on MWF   Gout, unspecified  Stable  Continue Allopurinol       Procedures:  HD  Consultations:  renal  Discharge Exam: Filed Vitals:   08/24/14 1305  BP: 112/58  Pulse: 60  Temp: 98.4 F (36.9 C)  Resp: 17    General: A+Ox3, NAd Cardiovascular: rrr Respiratory: clear  Discharge Instructions You were cared for by a hospitalist during your hospital stay. If you have any questions about your discharge medications or the care you received while you were in the hospital after you are discharged, you can call the unit and asked to speak with the hospitalist on call if the hospitalist that took care of you is not available. Once you are discharged, your primary care physician will handle any further medical issues. Please note that NO REFILLS for any discharge medications will be authorized once you are discharged, as it is imperative that you return to your primary care physician (or establish a relationship with a primary care physician if you do not  have one) for your aftercare needs so that they can reassess your need for medications and monitor your lab values.  Discharge Instructions   Diet - low sodium heart healthy    Complete by:  As directed      Discharge instructions    Complete by:  As directed   Cbc 1-2 weeks Keep appointment with hematology     Increase activity slowly    Complete by:  As directed           Current Discharge Medication List    START taking these medications    Details  darbepoetin (ARANESP) 200 MCG/0.4ML SOLN injection Inject 0.4 mLs (200 mcg total) into the vein every Monday with hemodialysis. Qty: 1.68 mL    doxercalciferol (HECTOROL) 4 MCG/2ML injection Inject 0.5 mLs (1 mcg total) into the vein every Monday, Wednesday, and Friday with hemodialysis. Qty: 2 mL    ferric gluconate 125 mg in sodium chloride 0.9 % 100 mL Inject 125 mg into the vein every Monday, Wednesday, and Friday with hemodialysis.    pantoprazole (PROTONIX) 40 MG tablet Take 1 tablet (40 mg total) by mouth daily. Qty: 30 tablet, Refills: 0      CONTINUE these medications which have NOT CHANGED   Details  acetaminophen (TYLENOL) 500 MG tablet Take 1,000 mg by mouth every 6 (six) hours as needed for moderate pain.    allopurinol (ZYLOPRIM) 100 MG tablet Take 100 mg by mouth daily.     amLODipine (NORVASC) 10 MG tablet Take 10 mg by mouth every evening.     aspirin 81 MG tablet Take 81 mg by mouth every morning.     bisacodyl (DULCOLAX) 5 MG EC tablet Take 5 mg by mouth daily as needed for moderate constipation.    calcium acetate (PHOSLO) 667 MG capsule Take 1 capsule (667 mg total) by mouth 3 (three) times daily with meals. Qty: 90 capsule, Refills: 0    carvedilol (COREG) 25 MG tablet Take 25 mg by mouth 2 (two) times daily with a meal.    clotrimazole-betamethasone (LOTRISONE) cream Apply 1 application topically 2 (two) times daily as needed (for rash).     colchicine 0.6 MG tablet Take 0.6 mg by mouth daily as needed. For gout    diphenhydrAMINE (BENADRYL) 25 MG tablet Take 25-50 mg by mouth every 6 (six) hours as needed for itching or allergies.     Tiotropium Bromide Monohydrate (SPIRIVA RESPIMAT) 2.5 MCG/ACT AERS Inhale 2 puffs into the lungs every morning. Qty: 1 Inhaler, Refills: 6       Allergies  Allergen Reactions  . Ibuprofen Other (See Comments)    Kidney problems-dialysis patient  . Nsaids Other (See Comments)    Kidney problems-dialysis  patient   Follow-up Information   Follow up with HILL,GERALD K, MD In 1 week.   Specialty:  Family Medicine   Contact information:   [address] [place] [address] [phone]        The results of significant diagnostics from this hospitalization (including imaging, microbiology, ancillary and laboratory) are listed below for reference.    Significant Diagnostic Studies: No results found.  Microbiology: No results found for this or any previous visit (from the past 240 hour(s)).   Labs: Basic Metabolic Panel:  Recent Labs Lab 08/22/14 0640 08/23/14 08/24/14 0128  NA 140 133* 135*  K 3.5* 3.5* 3.5*  CL 98 92* 93*  CO2 31 26 27   GLUCOSE 91 90 95  BUN  13 25* 38*  CREATININE 3.60* 4.91* 6.27*  CALCIUM 8.6 8.4 8.5   Liver Function Tests: No results found for this basename: AST, ALT, ALKPHOS, BILITOT, PROT, ALBUMIN,  in the last 168 hours No results found for this basename: LIPASE, AMYLASE,  in the last 168 hours No results found for this basename: AMMONIA,  in the last 168 hours CBC:  Recent Labs Lab 08/21/14 1715  08/22/14 0640  08/23/14 08/23/14 0400 08/23/14 1616 08/24/14 0128 08/24/14 0542  WBC 3.8*  --  3.9*  --  4.1  --   --  3.7*  --   NEUTROABS 2.8  --   --   --   --   --   --   --   --   HGB 6.2*  < > 7.5*  < > 8.0* 7.3* 7.0* 6.5* 6.7*  HCT 20.0*  < > 23.8*  < > 25.0* 22.5* 21.2* 20.2* 20.4*  MCV 96.2  --  93.0  --  91.6  --   --  90.6  --   PLT 72*  --  71*  --  64*  --   --  61*  --   < > = values in this interval not displayed. Cardiac Enzymes: No results found for this basename: CKTOTAL, CKMB, CKMBINDEX, TROPONINI,  in the last 168 hours BNP: BNP (last 3 results)  Recent Labs  09/17/13 0425 09/17/13 0747 04/27/14 0600  PROBNP 7483.0* 8483.0* 8130.0*   CBG:  Recent Labs Lab 08/23/14 1130 08/23/14 1623 08/23/14 2201  GLUCAP 91 105* 90       Signed:  VANN, JESSICA  [facility] 08/24/2014, 2:02 PM

## 2014-08-25 LAB — TYPE AND SCREEN
ABO/RH(D): O POS
Antibody Screen: NEGATIVE
UNIT DIVISION: 0
UNIT DIVISION: 0
Unit division: 0

## 2014-09-10 ENCOUNTER — Encounter (HOSPITAL_COMMUNITY): Payer: Self-pay | Admitting: Emergency Medicine

## 2014-09-10 ENCOUNTER — Inpatient Hospital Stay (HOSPITAL_COMMUNITY): Payer: Medicare Other

## 2014-09-10 ENCOUNTER — Inpatient Hospital Stay (HOSPITAL_COMMUNITY)
Admission: EM | Admit: 2014-09-10 | Discharge: 2014-09-15 | DRG: 811 | Disposition: A | Discharge status: Home / Self Care | Payer: Medicare Other | Attending: Internal Medicine | Admitting: Internal Medicine

## 2014-09-10 DIAGNOSIS — N186 End stage renal disease: Secondary | ICD-10-CM

## 2014-09-10 DIAGNOSIS — N189 Chronic kidney disease, unspecified: Secondary | ICD-10-CM

## 2014-09-10 DIAGNOSIS — Z89022 Acquired absence of left finger(s): Secondary | ICD-10-CM | POA: Diagnosis not present

## 2014-09-10 DIAGNOSIS — H540 Blindness, both eyes: Secondary | ICD-10-CM | POA: Diagnosis not present

## 2014-09-10 DIAGNOSIS — J441 Chronic obstructive pulmonary disease with (acute) exacerbation: Secondary | ICD-10-CM | POA: Diagnosis not present

## 2014-09-10 DIAGNOSIS — K219 Gastro-esophageal reflux disease without esophagitis: Secondary | ICD-10-CM | POA: Diagnosis present

## 2014-09-10 DIAGNOSIS — J189 Pneumonia, unspecified organism: Secondary | ICD-10-CM

## 2014-09-10 DIAGNOSIS — N2581 Secondary hyperparathyroidism of renal origin: Secondary | ICD-10-CM | POA: Diagnosis not present

## 2014-09-10 DIAGNOSIS — Z7982 Long term (current) use of aspirin: Secondary | ICD-10-CM

## 2014-09-10 DIAGNOSIS — F17210 Nicotine dependence, cigarettes, uncomplicated: Secondary | ICD-10-CM | POA: Diagnosis not present

## 2014-09-10 DIAGNOSIS — Z992 Dependence on renal dialysis: Secondary | ICD-10-CM | POA: Diagnosis not present

## 2014-09-10 DIAGNOSIS — J449 Chronic obstructive pulmonary disease, unspecified: Secondary | ICD-10-CM | POA: Diagnosis present

## 2014-09-10 DIAGNOSIS — I120 Hypertensive chronic kidney disease with stage 5 chronic kidney disease or end stage renal disease: Secondary | ICD-10-CM | POA: Diagnosis not present

## 2014-09-10 DIAGNOSIS — D631 Anemia in chronic kidney disease: Principal | ICD-10-CM

## 2014-09-10 DIAGNOSIS — I5189 Other ill-defined heart diseases: Secondary | ICD-10-CM

## 2014-09-10 DIAGNOSIS — I5033 Acute on chronic diastolic (congestive) heart failure: Secondary | ICD-10-CM | POA: Diagnosis not present

## 2014-09-10 DIAGNOSIS — N400 Enlarged prostate without lower urinary tract symptoms: Secondary | ICD-10-CM | POA: Diagnosis present

## 2014-09-10 DIAGNOSIS — D696 Thrombocytopenia, unspecified: Secondary | ICD-10-CM

## 2014-09-10 DIAGNOSIS — I519 Heart disease, unspecified: Secondary | ICD-10-CM

## 2014-09-10 DIAGNOSIS — D649 Anemia, unspecified: Secondary | ICD-10-CM | POA: Diagnosis present

## 2014-09-10 DIAGNOSIS — M109 Gout, unspecified: Secondary | ICD-10-CM | POA: Diagnosis not present

## 2014-09-10 DIAGNOSIS — D61818 Other pancytopenia: Secondary | ICD-10-CM | POA: Diagnosis not present

## 2014-09-10 DIAGNOSIS — I10 Essential (primary) hypertension: Secondary | ICD-10-CM

## 2014-09-10 LAB — CBC WITH DIFFERENTIAL/PLATELET
BASOS PCT: 0 % (ref 0–1)
Basophils Absolute: 0 10*3/uL (ref 0.0–0.1)
EOS ABS: 0 10*3/uL (ref 0.0–0.7)
Eosinophils Relative: 0 % (ref 0–5)
HCT: 22.1 % — ABNORMAL LOW (ref 39.0–52.0)
HEMOGLOBIN: 6.7 g/dL — AB (ref 13.0–17.0)
Lymphocytes Relative: 26 % (ref 12–46)
Lymphs Abs: 0.7 10*3/uL (ref 0.7–4.0)
MCH: 28.4 pg (ref 26.0–34.0)
MCHC: 30.3 g/dL (ref 30.0–36.0)
MCV: 93.6 fL (ref 78.0–100.0)
MONOS PCT: 9 % (ref 3–12)
Monocytes Absolute: 0.3 10*3/uL (ref 0.1–1.0)
NEUTROS PCT: 65 % (ref 43–77)
Neutro Abs: 1.8 10*3/uL (ref 1.7–7.7)
PLATELETS: 65 10*3/uL — AB (ref 150–400)
RBC: 2.36 MIL/uL — ABNORMAL LOW (ref 4.22–5.81)
RDW: 16.7 % — ABNORMAL HIGH (ref 11.5–15.5)
WBC: 2.7 10*3/uL — ABNORMAL LOW (ref 4.0–10.5)

## 2014-09-10 LAB — COMPREHENSIVE METABOLIC PANEL
ALBUMIN: 3.4 g/dL — AB (ref 3.5–5.2)
ALK PHOS: 67 U/L (ref 39–117)
ALT: 6 U/L (ref 0–53)
ANION GAP: 14 (ref 5–15)
AST: 13 U/L (ref 0–37)
BUN: 23 mg/dL (ref 6–23)
CALCIUM: 8.6 mg/dL (ref 8.4–10.5)
CO2: 33 mEq/L — ABNORMAL HIGH (ref 19–32)
Chloride: 98 mEq/L (ref 96–112)
Creatinine, Ser: 4.61 mg/dL — ABNORMAL HIGH (ref 0.50–1.35)
GFR calc Af Amer: 14 mL/min — ABNORMAL LOW (ref >90)
GFR calc non Af Amer: 12 mL/min — ABNORMAL LOW (ref >90)
Glucose, Bld: 88 mg/dL (ref 70–99)
POTASSIUM: 3.8 meq/L (ref 3.7–5.3)
Sodium: 145 mEq/L (ref 137–147)
TOTAL PROTEIN: 7.6 g/dL (ref 6.0–8.3)
Total Bilirubin: 0.6 mg/dL (ref 0.3–1.2)

## 2014-09-10 LAB — DIRECT ANTIGLOBULIN TEST (NOT AT ARMC)
DAT, IgG: NEGATIVE
DAT, complement: NEGATIVE

## 2014-09-10 LAB — PREPARE RBC (CROSSMATCH)

## 2014-09-10 MED ORDER — ALBUTEROL SULFATE (2.5 MG/3ML) 0.083% IN NEBU: 2.5000 mg | INHALATION_SOLUTION | RESPIRATORY_TRACT | Status: DC | PRN

## 2014-09-10 MED ORDER — SODIUM CHLORIDE 0.9 % IV SOLN
Freq: Once | INTRAVENOUS | Status: AC
  Administered 2014-09-11: 02:00:00 via INTRAVENOUS

## 2014-09-10 MED ORDER — LEVOFLOXACIN IN D5W 500 MG/100ML IV SOLN
500.0000 mg | INTRAVENOUS | Status: DC
  Administered 2014-09-12: 500 mg via INTRAVENOUS
  Filled 2014-09-10: qty 100

## 2014-09-10 MED ORDER — AMLODIPINE BESYLATE 10 MG PO TABS
10.0000 mg | ORAL_TABLET | Freq: Every evening | ORAL | Status: DC
  Administered 2014-09-10 – 2014-09-14 (×5): 10 mg via ORAL
  Filled 2014-09-10 (×6): qty 1

## 2014-09-10 MED ORDER — ONDANSETRON HCL 4 MG PO TABS: 4.0000 mg | ORAL_TABLET | Freq: Four times a day (QID) | ORAL | Status: DC | PRN

## 2014-09-10 MED ORDER — VANCOMYCIN HCL IN DEXTROSE 750-5 MG/150ML-% IV SOLN
750.0000 mg | INTRAVENOUS | Status: DC
  Administered 2014-09-11 – 2014-09-14 (×2): 750 mg via INTRAVENOUS
  Filled 2014-09-10 (×4): qty 150

## 2014-09-10 MED ORDER — IPRATROPIUM BROMIDE 0.02 % IN SOLN
0.5000 mg | RESPIRATORY_TRACT | Status: DC
  Filled 2014-09-10: qty 2.5

## 2014-09-10 MED ORDER — CARVEDILOL 25 MG PO TABS
25.0000 mg | ORAL_TABLET | Freq: Two times a day (BID) | ORAL | Status: DC
  Administered 2014-09-11 – 2014-09-15 (×9): 25 mg via ORAL
  Filled 2014-09-10 (×11): qty 1

## 2014-09-10 MED ORDER — PANTOPRAZOLE SODIUM 40 MG PO TBEC
40.0000 mg | DELAYED_RELEASE_TABLET | Freq: Every day | ORAL | Status: DC
  Administered 2014-09-10 – 2014-09-15 (×6): 40 mg via ORAL
  Filled 2014-09-10 (×6): qty 1

## 2014-09-10 MED ORDER — ALLOPURINOL 100 MG PO TABS
100.0000 mg | ORAL_TABLET | Freq: Every day | ORAL | Status: DC
  Filled 2014-09-10: qty 1

## 2014-09-10 MED ORDER — IPRATROPIUM-ALBUTEROL 0.5-2.5 (3) MG/3ML IN SOLN
3.0000 mL | Freq: Two times a day (BID) | RESPIRATORY_TRACT | Status: DC
  Administered 2014-09-11 – 2014-09-15 (×8): 3 mL via RESPIRATORY_TRACT
  Filled 2014-09-10 (×9): qty 3

## 2014-09-10 MED ORDER — VANCOMYCIN HCL 10 G IV SOLR
1250.0000 mg | Freq: Once | INTRAVENOUS | Status: AC
  Administered 2014-09-10: 1250 mg via INTRAVENOUS
  Filled 2014-09-10: qty 1250

## 2014-09-10 MED ORDER — GUAIFENESIN ER 600 MG PO TB12
600.0000 mg | ORAL_TABLET | Freq: Two times a day (BID) | ORAL | Status: DC
  Administered 2014-09-10 – 2014-09-15 (×10): 600 mg via ORAL
  Filled 2014-09-10 (×11): qty 1

## 2014-09-10 MED ORDER — SODIUM CHLORIDE 0.9 % IJ SOLN
3.0000 mL | Freq: Two times a day (BID) | INTRAMUSCULAR | Status: DC
  Administered 2014-09-10 – 2014-09-15 (×10): 3 mL via INTRAVENOUS

## 2014-09-10 MED ORDER — ONDANSETRON 8 MG/NS 50 ML IVPB
8.0000 mg | Freq: Four times a day (QID) | INTRAVENOUS | Status: DC | PRN
  Filled 2014-09-10: qty 8

## 2014-09-10 MED ORDER — ACETAMINOPHEN 500 MG PO TABS: 1000.0000 mg | ORAL_TABLET | Freq: Four times a day (QID) | ORAL | Status: DC | PRN

## 2014-09-10 MED ORDER — INFLUENZA VAC SPLIT QUAD 0.5 ML IM SUSY
0.5000 mL | PREFILLED_SYRINGE | INTRAMUSCULAR | Status: AC
Start: 2014-09-11 — End: 2014-09-11
  Administered 2014-09-11: 0.5 mL via INTRAMUSCULAR
  Filled 2014-09-10: qty 0.5

## 2014-09-10 MED ORDER — IPRATROPIUM-ALBUTEROL 0.5-2.5 (3) MG/3ML IN SOLN
3.0000 mL | RESPIRATORY_TRACT | Status: DC
  Administered 2014-09-10: 3 mL via RESPIRATORY_TRACT

## 2014-09-10 MED ORDER — DIPHENHYDRAMINE HCL 25 MG PO TABS
25.0000 mg | ORAL_TABLET | Freq: Four times a day (QID) | ORAL | Status: DC | PRN
  Filled 2014-09-10: qty 2

## 2014-09-10 MED ORDER — LEVOFLOXACIN IN D5W 750 MG/150ML IV SOLN
750.0000 mg | Freq: Once | INTRAVENOUS | Status: AC
  Administered 2014-09-10: 750 mg via INTRAVENOUS
  Filled 2014-09-10: qty 150

## 2014-09-10 MED ORDER — BISACODYL 5 MG PO TBEC
5.0000 mg | DELAYED_RELEASE_TABLET | Freq: Every day | ORAL | Status: DC | PRN
  Filled 2014-09-10: qty 1

## 2014-09-10 MED ORDER — ALBUTEROL SULFATE (2.5 MG/3ML) 0.083% IN NEBU
2.5000 mg | INHALATION_SOLUTION | RESPIRATORY_TRACT | Status: DC
  Filled 2014-09-10: qty 3

## 2014-09-10 NOTE — ED Notes (Signed)
Family at bedside. 

## 2014-09-10 NOTE — H&P (Signed)
[facility] History and Physical  Patient: Cameron Mendez  IRJ:[id number]  DOB: 10/26/1946  DOS: the patient was seen and examined on 09/10/2014 PCP: [name], MD  Chief Complaint: Abnormal lab  HPI: Cameron Mendez is a 68 y.o. male with Past medical history of chronic diastolic dysfunction, hypertension, ESRD on hemodialysis, COPD, anemia of kidney disease, chronic thrombocytopenia. the patient presents to ER with the complaint of abnormal lab. Patient was recently admitted for anemia, etiology of that was not identified and patient was referred to hematology for further workup as an outpatient but unfortunately the patient has not seen a hematologist. Yesterday when the patient went with hemodialysis routine CBC was performed and he was found to have hemoglobin of 6 and therefore he was referred to ER for further workup. Patient continues to deny any active bleeding, black color bowel movement, nausea, vomiting, dizziness, diarrhea. Patient also denies any abdominal pain at the time of my evaluation although he mention few days ago he had suprapubic pain. Patient is on aspirin and mentions to me that he has been getting heparin with his hemodialysis.(Although during his last admission he never received heparin with dialysis) Patient complains of tiredness whenever he is trying to move. He denies any chest pain chest heaviness palpitation. He also complains of increasing worsening shortness of breath, with dry cough which is also progressively worsening since last few days. He complains of some acid reflux on and off and is not taking the Protonix that he was prescribed on last admission. He denies any over the counter NSAIDS.  The patient is coming from home. And at his baseline independent for most of his ADL.  Review of Systems: as mentioned in the history of present illness.  A Comprehensive review of the other systems is negative.  Past Medical History  Diagnosis Date  .  Anemia   . Diastolic dysfunction   . Retinitis pigmentosa     Blindness--has shunt --placed yrs ago in [place]..  . Arthritis     Gout  . Hypertension     dx--"long time"  . CKD (chronic kidney disease), stage IV     a. L upper extremity AV fistula created 07/2012.  [name] BPH (benign prostatic hyperplasia)   . Blind   . Gangrene of finger     a. 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  . Colon polyps   . Pericardial effusion     a. Noted on echo 10/2009. b. Again seen on echo 09/2013.  [name] CHF (congestive heart failure)    Past Surgical History  Procedure Laterality Date  . Eye surgery    . Knee ligament reconstruction      left  . Av fistula placement  07/15/2012    Procedure: ARTERIOVENOUS (AV) FISTULA CREATION;  Surgeon: Rosetta Posner, MD;  Location: [place];  Service: Vascular;  Laterality: Left;  . Amputation  12/30/2012    Procedure: AMPUTATION DIGIT;  Surgeon: Tennis Must, MD;  Location: [facility];  Service: Orthopedics;  Laterality: Left;  LEFT SMALL FINGER AMPUTATION   Social History:  reports that he has been smoking Cigarettes.  He has a 25 pack-year smoking history. He has never used smokeless tobacco. He reports that he does not drink alcohol or use illicit drugs.  Allergies  Allergen Reactions  . Ibuprofen Other (See Comments)    Kidney problems-dialysis patient  . Nsaids Other (See Comments)    Kidney problems-dialysis patient    Family History  Problem Relation Age of Onset  . Cancer Mother     OVARIAN    Prior to Admission medications   Medication Sig Start Date End Date Taking? Authorizing Provider  acetaminophen (TYLENOL) 500 MG tablet Take 1,000 mg by mouth every 6 (six) hours as needed for moderate pain.   Yes Historical Provider, MD  allopurinol (ZYLOPRIM) 100 MG tablet Take 100 mg by mouth daily.    Yes Historical Provider, MD  amLODipine (NORVASC) 10 MG tablet Take 10 mg by mouth every evening.    Yes Historical Provider, MD   aspirin 81 MG tablet Take 81 mg by mouth every morning.    Yes Historical Provider, MD  bisacodyl (DULCOLAX) 5 MG EC tablet Take 5 mg by mouth daily as needed for moderate constipation.   Yes Historical Provider, MD  carvedilol (COREG) 25 MG tablet Take 25 mg by mouth 2 (two) times daily with a meal.   Yes Historical Provider, MD  clotrimazole-betamethasone (LOTRISONE) cream Apply 1 application topically 2 (two) times daily as needed (for rash).    Yes Historical Provider, MD  colchicine 0.6 MG tablet Take 0.6 mg by mouth daily as needed. For gout   Yes Historical Provider, MD  darbepoetin (ARANESP) 200 MCG/0.4ML SOLN injection Inject 200 mcg into the vein every 7 (seven) days. Take on Mondays 08/24/14  Yes [name], DO  diphenhydrAMINE (BENADRYL) 25 MG tablet Take 25-50 mg by mouth every 6 (six) hours as needed for itching or allergies.    Yes Historical Provider, MD  doxercalciferol (HECTOROL) 4 MCG/2ML injection Inject 0.5 mLs (1 mcg total) into the vein every Monday, Wednesday, and Friday with hemodialysis. 08/24/14  Yes [name], DO  ferric gluconate 125 mg in sodium chloride 0.9 % 100 mL Inject 125 mg into the vein every Monday, Wednesday, and Friday with hemodialysis. 08/24/14  Yes Jessica U Vann, DO  pantoprazole (PROTONIX) 40 MG tablet Take 40 mg by mouth daily. 08/24/14  Yes [name], DO  Tiotropium Bromide Monohydrate (SPIRIVA RESPIMAT) 2.5 MCG/ACT AERS Inhale 2 puffs into the lungs every morning. 08/04/14  Yes [name], MD    Physical Exam: Filed Vitals:   09/10/14 1845 09/10/14 1915 09/10/14 1919 09/10/14 1930  BP: 127/42 126/43 123/41 124/63  Pulse: 69 70 71 74  Temp:   98.1 F (36.7 C)   TempSrc:   Oral   Resp: 13 10 16 16   SpO2: 99% 98% 100% 98%    General: Alert, Awake and Oriented to Time, Place and Person. Appear in mild distress Eyes: PERRL ENT: Oral Mucosa clear moist. Neck: no JVD Cardiovascular: S1 and S2 Present, no Murmur, Peripheral Pulses  Present Respiratory: Bilateral Air entry equal and Decreased, no Crackles, bilateral expiratory  wheezes Abdomen: Bowel Sound Present , Soft and Non tender Skin: no Rash Extremities:  no  Pedal edema, no calf tenderness Neurologic: Grossly no focal neuro deficit.  Labs on Admission:  CBC:  Recent Labs Lab 09/10/14 1721  WBC 2.7*  NEUTROABS 1.8  HGB 6.7*  HCT 22.1*  MCV 93.6  PLT 65*    CMP     Component Value Date/Time   NA 145 09/10/2014 1721   K 3.8 09/10/2014 1721   CL 98 09/10/2014 1721   CO2 33* 09/10/2014 1721   GLUCOSE 88 09/10/2014 1721   BUN 23 09/10/2014 1721   CREATININE 4.61* 09/10/2014 1721   CALCIUM 8.6 09/10/2014 1721   PROT 7.6 09/10/2014 1721   ALBUMIN  3.4* 09/10/2014 1721   AST 13 09/10/2014 1721   ALT 6 09/10/2014 1721   ALKPHOS 67 09/10/2014 1721   BILITOT 0.6 09/10/2014 1721   GFRNONAA 12* 09/10/2014 1721   GFRAA 14* 09/10/2014 1721    No results found for this basename: LIPASE, AMYLASE,  in the last 168 hours No results found for this basename: AMMONIA,  in the last 168 hours  No results found for this basename: CKTOTAL, CKMB, CKMBINDEX, TROPONINI,  in the last 168 hours BNP (last 3 results)  Recent Labs  09/17/13 0425 09/17/13 0747 04/27/14 0600  PROBNP 7483.0* 8483.0* 8130.0*    Radiological Exams on Admission: Dg Chest 2 View  09/10/2014   CLINICAL DATA:  Shortness of breath, COPD exacerbation  EXAM: CHEST  2 VIEW  COMPARISON:  04/28/2014  FINDINGS: Cardiomegaly with pulmonary vascular congestion. Superimposed right perihilar opacity, pneumonia not excluded. No pleural effusion or pneumothorax.  Right VP shunt catheter.  IMPRESSION: Cardiomegaly with pulmonary vascular congestion.  Superimposed right perihilar opacity, pneumonia not excluded.   Electronically Signed   By: Julian Hy M.D.   On: 09/10/2014 21:00    Assessment/Plan Principal Problem:   Anemia Active Problems:   Essential hypertension, benign   ESRD on hemodialysis   COPD  (chronic obstructive pulmonary disease)   Other pancytopenia   Diastolic dysfunction   suspected COPD with acute exacerbation   1. Anemia Pancytopenia  The patient is presenting with complaints of abnormal lab with shortness of breath and tiredness without any signs of active bleeding. He has chronically low hemoglobin as well as platelets, and has not seen any hematologist for further workup lately. I will check Hemoccult, vitamin B-12, folate acid, ferritin, iron studies, reticulocyte, haptoglobin, LDH, TSH, Coombs' for initial workup. With  All 3 cell lines being on a lower side possibility of marrow suppression, medication side effect, nutritional deficiency cannot be ruled out. He will require further workup. At present due to hemoglobin being less than 7 I will give him one unit of PRBC. Monitor posttransfusion H&H.  2.ESRD  Would continue hemodialysis, consult nephrology. Iron supplement as well as  Aranesp per nephrology. he mentions he has been receiving heparin with his HD although I doubt that. Monitor platelets. Chest x-ray shows vascular congestion, patient will be receiving hemodialysis tomorrow.  3.COPD with acute exacerbation Possible pneumonia Patient is presenting with cough and shortness of breath without any fever. Chest x-ray shows increased vascular congestion and possible infiltrate. Patient appears to have bilateral wheezing with productive sputum. At present I would treat him as possible COPD exacerbation with DuoNeb, oxygen as needed, levofloxacin. Follow sputum culture, influenza PCR.  4.Possible acute on chronic diastolic dysfunction. Patient on hemodialysis with ESRD without any urination. Patient is not on any oxygen at present.  Patient will get hemodialysis tomorrow  Consults:  nephrology   DVT Prophylaxis: mechanical compression device Nutrition:  n.p.o. except medication   Code Status: full   Family Communication:  family  was present at bedside,  opportunity was given to ask question and all questions were answered satisfactorily at the time of interview. Disposition: Admitted to inpatient in telemetry unit.  Author: Berle Mull, MD [facility] Pager: [phone] 09/10/2014, 8:30 PM    If 7PM-7AM, please contact night-coverage [email or web address] Password [email or web address]

## 2014-09-10 NOTE — ED Provider Notes (Signed)
CSN: [id number]     Arrival date & time 09/10/14  1708 History   First MD Initiated Contact with Patient 09/10/14 1759     Chief Complaint  Patient presents with  . Abnormal Lab     (Consider location/radiation/quality/duration/timing/severity/associated sxs/prior Treatment) Patient is a 68 y.o. male presenting with general illness. The history is provided by the patient and medical records.  Illness Location:  Diffuse Quality:  Anemic on routine blood draw. No sxs Severity:  Unable to specify Onset quality:  Unable to specify Timing:  Constant Progression:  Worsening Chronicity:  Recurrent Context:  Hx of ESRD on MWF HD. Anemic 3 weeks ago, admitted and transfused. given Heme f/u but never went. On routine blood draw at HD, found to be anemic, sent here Relieved by:  N/a Worsened by:  N/a Ineffective treatments:  N/a Associated symptoms: no abdominal pain, no chest pain, no congestion, no cough, no diarrhea, no fatigue, no fever, no loss of consciousness, no nausea, no rash, no rhinorrhea, no shortness of breath, no vomiting and no wheezing   Risk factors:  ESRd, hx of similar   Past Medical History  Diagnosis Date  . Anemia   . Diastolic dysfunction   . Retinitis pigmentosa     Blindness--has shunt --placed yrs ago in [place]..  . Arthritis     Gout  . Hypertension     dx--"long time"  . CKD (chronic kidney disease), stage IV     a. L upper extremity AV fistula created 07/2012.  [name] BPH (benign prostatic hyperplasia)   . Blind   . Gangrene of finger     a. 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  . Colon polyps   . Pericardial effusion     a. Noted on echo 10/2009. b. Again seen on echo 09/2013.  [name] CHF (congestive heart failure)    Past Surgical History  Procedure Laterality Date  . Eye surgery    . Knee ligament reconstruction      left  . Av fistula placement  07/15/2012    Procedure: ARTERIOVENOUS (AV) FISTULA CREATION;  Surgeon: Rosetta Posner, MD;   Location: [facility];  Service: Vascular;  Laterality: Left;  . Amputation  12/30/2012    Procedure: AMPUTATION DIGIT;  Surgeon: Tennis Must, MD;  Location: [facility];  Service: Orthopedics;  Laterality: Left;  LEFT SMALL FINGER AMPUTATION   Family History  Problem Relation Age of Onset  . Cancer Mother     OVARIAN   History  Substance Use Topics  . Smoking status: Current Every Day Smoker -- 0.50 packs/day for 50 years    Types: Cigarettes  . Smokeless tobacco: Never Used  . Alcohol Use: No    Review of Systems  Constitutional: Negative for fever, activity change, appetite change and fatigue.  HENT: Negative for congestion and rhinorrhea.   Eyes: Negative for discharge and itching.  Respiratory: Negative for cough, shortness of breath and wheezing.   Cardiovascular: Negative for chest pain.  Gastrointestinal: Negative for nausea, vomiting, abdominal pain, diarrhea and constipation.  Genitourinary: Negative for hematuria, decreased urine volume and difficulty urinating.  Skin: Negative for rash and wound.  Neurological: Negative for loss of consciousness, syncope, weakness and numbness.  All other systems reviewed and are negative.     Allergies  Ibuprofen and Nsaids  Home Medications   Prior to Admission medications   Medication Sig Start Date End Date Taking? Authorizing Provider  acetaminophen (TYLENOL) 500 MG tablet Take  1,000 mg by mouth every 6 (six) hours as needed for moderate pain.   Yes Historical Provider, MD  allopurinol (ZYLOPRIM) 100 MG tablet Take 100 mg by mouth daily.    Yes Historical Provider, MD  amLODipine (NORVASC) 10 MG tablet Take 10 mg by mouth every evening.    Yes Historical Provider, MD  aspirin 81 MG tablet Take 81 mg by mouth every morning.    Yes Historical Provider, MD  bisacodyl (DULCOLAX) 5 MG EC tablet Take 5 mg by mouth daily as needed for moderate constipation.   Yes Historical Provider, MD  carvedilol (COREG) 25 MG tablet  Take 25 mg by mouth 2 (two) times daily with a meal.   Yes Historical Provider, MD  clotrimazole-betamethasone (LOTRISONE) cream Apply 1 application topically 2 (two) times daily as needed (for rash).    Yes Historical Provider, MD  colchicine 0.6 MG tablet Take 0.6 mg by mouth daily as needed. For gout   Yes Historical Provider, MD  darbepoetin (ARANESP) 200 MCG/0.4ML SOLN injection Inject 200 mcg into the vein every 7 (seven) days. Take on Mondays 08/24/14  Yes [name], DO  diphenhydrAMINE (BENADRYL) 25 MG tablet Take 25-50 mg by mouth every 6 (six) hours as needed for itching or allergies.    Yes Historical Provider, MD  doxercalciferol (HECTOROL) 4 MCG/2ML injection Inject 0.5 mLs (1 mcg total) into the vein every Monday, Wednesday, and Friday with hemodialysis. 08/24/14  Yes [name], DO  ferric gluconate 125 mg in sodium chloride 0.9 % 100 mL Inject 125 mg into the vein every Monday, Wednesday, and Friday with hemodialysis. 08/24/14  Yes Jessica U Vann, DO  pantoprazole (PROTONIX) 40 MG tablet Take 40 mg by mouth daily. 08/24/14  Yes [name], DO  Tiotropium Bromide Monohydrate (SPIRIVA RESPIMAT) 2.5 MCG/ACT AERS Inhale 2 puffs into the lungs every morning. 08/04/14  Yes [name], MD   BP 123/41  Pulse 71  Temp(Src) 98.1 F (36.7 C) (Oral)  Resp 16  SpO2 100% Physical Exam  Vitals reviewed. Constitutional: He is oriented to person, place, and time. He appears well-developed and well-nourished. No distress.  HENT:  Head: Normocephalic and atraumatic.  Mouth/Throat: Oropharynx is clear and moist. No oropharyngeal exudate.  Eyes: EOM are normal. Pupils are equal, round, and reactive to light. Right eye exhibits no discharge. Left eye exhibits no discharge. No scleral icterus.  Pale conj  Neck: Normal range of motion. Neck supple.  Cardiovascular: Normal rate, regular rhythm, normal heart sounds and intact distal pulses.  Exam reveals no gallop and no friction rub.    No murmur heard. Pulmonary/Chest: Effort normal and breath sounds normal. No respiratory distress. He has no wheezes. He has no rales.  Abdominal: Soft. He exhibits no distension and no mass. There is no tenderness.  Musculoskeletal: Normal range of motion.  Neurological: He is alert and oriented to person, place, and time. No cranial nerve deficit. He exhibits normal muscle tone. Coordination normal.  Skin: Skin is warm. No rash noted. He is not diaphoretic.    ED Course  Procedures (including critical care time) Labs Review Labs Reviewed  CBC WITH DIFFERENTIAL - Abnormal; Notable for the following:    WBC 2.7 (*)    RBC 2.36 (*)    Hemoglobin 6.7 (*)    HCT 22.1 (*)    RDW 16.7 (*)    Platelets 65 (*)    All other components within normal limits  COMPREHENSIVE METABOLIC PANEL -  Abnormal; Notable for the following:    CO2 33 (*)    Creatinine, Ser 4.61 (*)    Albumin 3.4 (*)    GFR calc non Af Amer 12 (*)    GFR calc Af Amer 14 (*)    All other components within normal limits  TYPE AND SCREEN    Imaging Review No results found.   EKG Interpretation None      MDM   MDM: 69 y.o. AAM w./ PMHx of ESRD, anemia w/ cc: of anemia. MWF HD. Took blood at [facility] and called and told he is anemic. No chest pain, SOB, abd pain, n/v/d, f/c, HA, numbness. Here well appeairng, NAD. Exam as above. Hx of anemia in past and admitted 3 weeks ago and given Heme f/u but never made it. Asymptomatic. D/w hospitalist who will admit. Instructed by them not to transfuse at this time and they will check with Renal as inpatient on timing of transfusion. Admit.   Final diagnoses:  Anemia in chronic renal disease    Admit to Hospitalist  Sol Passer, MD 09/10/14 1926

## 2014-09-10 NOTE — Progress Notes (Signed)
ANTIBIOTIC CONSULT NOTE - INITIAL  Pharmacy Consult for Vancomycin, Levaquin Indication: pneumonia  Allergies  Allergen Reactions  . Ibuprofen Other (See Comments)    Kidney problems-dialysis patient  . Nsaids Other (See Comments)    Kidney problems-dialysis patient    Patient Measurements: Height: 5\' 3"  (160 cm) Weight: 141 lb 1.5 oz (64 kg) IBW/kg (Calculated) : 56.9  Labs:  Recent Labs  09/10/14 1721  WBC 2.7*  HGB 6.7*  PLT 65*  CREATININE 4.61*    Microbiology: No results found for this or any previous visit (from the past 720 hour(s)).  Medical History: Past Medical History  Diagnosis Date  . Anemia   . Diastolic dysfunction   . Retinitis pigmentosa     Blindness--has shunt --placed yrs ago in [place]..  . Arthritis     Gout  . Hypertension     dx--"long time"  . CKD (chronic kidney disease), stage IV     a. L upper extremity AV fistula created 07/2012.  [name] BPH (benign prostatic hyperplasia)   . Blind   . Gangrene of finger     a. 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  . Colon polyps   . Pericardial effusion     a. Noted on echo 10/2009. b. Again seen on echo 09/2013.  [name] CHF (congestive heart failure)     Assessment: 68 year old male with ESRD HD MWF Admitted with HCAP and low HgB Pharmacy asked to begin Levaquin and Vancomycin  Goal of Therapy:  Appropriate dosing  Plan:  1) Levaquin 750 mg iv x 1 then 500 mg iv Q 48 hours 2) Vancomycin 1250 mg iv x 1 now then 750 mg IV Q HD 3) Follow up cultures, progress, fever trend  Thank you Anette Guarneri, PharmD [phone]   09/10/2014,9:32 PM

## 2014-09-10 NOTE — Progress Notes (Signed)
Notified lab re: stat labs. Vianny Schraeder, [name], Therapist, sports

## 2014-09-10 NOTE — ED Notes (Signed)
Pt in stating that he was called from his dialysis center and told his hemaglobin was 6.0 when checked yesterday, pt recently seen here for similar symptoms, pt reports generalized fatigue

## 2014-09-10 NOTE — ED Provider Notes (Signed)
I saw and evaluated the patient, reviewed the resident's note and I agree with the findings and plan.   EKG Interpretation None      Results for orders placed during the hospital encounter of 09/10/14  CBC WITH DIFFERENTIAL      Result Value Ref Range   WBC 2.7 (*) 4.0 - 10.5 K/uL   RBC 2.36 (*) 4.22 - 5.81 MIL/uL   Hemoglobin 6.7 (*) 13.0 - 17.0 g/dL   HCT 22.1 (*) 39.0 - 52.0 %   MCV 93.6  78.0 - 100.0 fL   MCH 28.4  26.0 - 34.0 pg   MCHC 30.3  30.0 - 36.0 g/dL   RDW 16.7 (*) 11.5 - 15.5 %   Platelets 65 (*) 150 - 400 K/uL   Neutrophils Relative % 65  43 - 77 %   Neutro Abs 1.8  1.7 - 7.7 K/uL   Lymphocytes Relative 26  12 - 46 %   Lymphs Abs 0.7  0.7 - 4.0 K/uL   Monocytes Relative 9  3 - 12 %   Monocytes Absolute 0.3  0.1 - 1.0 K/uL   Eosinophils Relative 0  0 - 5 %   Eosinophils Absolute 0.0  0.0 - 0.7 K/uL   Basophils Relative 0  0 - 1 %   Basophils Absolute 0.0  0.0 - 0.1 K/uL  COMPREHENSIVE METABOLIC PANEL      Result Value Ref Range   Sodium 145  137 - 147 mEq/L   Potassium 3.8  3.7 - 5.3 mEq/L   Chloride 98  96 - 112 mEq/L   CO2 33 (*) 19 - 32 mEq/L   Glucose, Bld 88  70 - 99 mg/dL   BUN 23  6 - 23 mg/dL   Creatinine, Ser 4.61 (*) 0.50 - 1.35 mg/dL   Calcium 8.6  8.4 - 10.5 mg/dL   Total Protein 7.6  6.0 - 8.3 g/dL   Albumin 3.4 (*) 3.5 - 5.2 g/dL   AST 13  0 - 37 U/L   ALT 6  0 - 53 U/L   Alkaline Phosphatase 67  39 - 117 U/L   Total Bilirubin 0.6  0.3 - 1.2 mg/dL   GFR calc non Af Amer 12 (*) >90 mL/min   GFR calc Af Amer 14 (*) >90 mL/min   Anion gap 14  5 - 15  TYPE AND SCREEN      Result Value Ref Range   ABO/RH(D) O POS     Antibody Screen NEG     Sample Expiration 09/13/2014     Unit Number [id number]     Blood Component Type RED CELLS,LR     Unit division 00     Status of Unit ALLOCATED     Transfusion Status OK TO TRANSFUSE     Crossmatch Result Compatible    PREPARE RBC (CROSSMATCH)      Result Value Ref Range   Order Confirmation  ORDER PROCESSED BY BLOOD BANK     No results found.  Patient seen by me. Patient is a dialysis patient normally dialyzed Monday Wednesdays and Fridays he was dialyzed yesterday. Patient's been having some persistent weakness and generalized fatigue. Patient received a call from his dialysis center comes hemoglobin was 6.0. Hemoglobin is low here today patient has been trending with hemoglobins 7 or lower for the past 2 weeks. Certainly could be contributing to his symptoms. Discussed with the admitting hospitalist team they will in that the patient probably will require  transfusion for symptomatic anemia. Patient though is nontoxic no acute distress. AV fistula in his left arm is functioning well. Lungs were clear bilaterally abdomen soft and nontender.  Fredia Sorrow, MD 09/10/14 2000

## 2014-09-10 NOTE — ED Notes (Addendum)
Per Dr. Posey Pronto, Radiology called to come get pt for xray before transport to floor.

## 2014-09-10 NOTE — ED Notes (Signed)
Patient denies pain and is resting comfortably.  

## 2014-09-10 NOTE — ED Notes (Signed)
hospitalist at bedside

## 2014-09-11 ENCOUNTER — Encounter (HOSPITAL_COMMUNITY): Payer: Self-pay | Admitting: General Practice

## 2014-09-11 DIAGNOSIS — D631 Anemia in chronic kidney disease: Secondary | ICD-10-CM | POA: Diagnosis not present

## 2014-09-11 LAB — CBC WITH DIFFERENTIAL/PLATELET
BASOS ABS: 0 10*3/uL (ref 0.0–0.1)
Basophils Relative: 1 % (ref 0–1)
EOS PCT: 0 % (ref 0–5)
Eosinophils Absolute: 0 10*3/uL (ref 0.0–0.7)
HCT: 24.3 % — ABNORMAL LOW (ref 39.0–52.0)
Hemoglobin: 7.8 g/dL — ABNORMAL LOW (ref 13.0–17.0)
LYMPHS ABS: 0.6 10*3/uL — AB (ref 0.7–4.0)
Lymphocytes Relative: 17 % (ref 12–46)
MCH: 28.9 pg (ref 26.0–34.0)
MCHC: 32.1 g/dL (ref 30.0–36.0)
MCV: 90 fL (ref 78.0–100.0)
MONOS PCT: 9 % (ref 3–12)
Monocytes Absolute: 0.3 10*3/uL (ref 0.1–1.0)
NEUTROS PCT: 73 % (ref 43–77)
Neutro Abs: 2.7 10*3/uL (ref 1.7–7.7)
Platelets: 65 10*3/uL — ABNORMAL LOW (ref 150–400)
RBC: 2.7 MIL/uL — ABNORMAL LOW (ref 4.22–5.81)
RDW: 17.6 % — AB (ref 11.5–15.5)
WBC: 3.6 10*3/uL — AB (ref 4.0–10.5)

## 2014-09-11 LAB — VITAMIN B12: VITAMIN B 12: 389 pg/mL (ref 211–911)

## 2014-09-11 LAB — IRON AND TIBC
Iron: 116 ug/dL (ref 42–135)
SATURATION RATIOS: 65 % — AB (ref 20–55)
TIBC: 179 ug/dL — AB (ref 215–435)
UIBC: 63 ug/dL — ABNORMAL LOW (ref 125–400)

## 2014-09-11 LAB — PREPARE RBC (CROSSMATCH)

## 2014-09-11 LAB — INFLUENZA PANEL BY PCR (TYPE A & B)
H1N1FLUPCR: NOT DETECTED
Influenza A By PCR: NEGATIVE
Influenza B By PCR: NEGATIVE

## 2014-09-11 LAB — TSH: TSH: 1.2 u[IU]/mL (ref 0.350–4.500)

## 2014-09-11 LAB — FERRITIN: FERRITIN: 1590 ng/mL — AB (ref 22–322)

## 2014-09-11 LAB — OCCULT BLOOD X 1 CARD TO LAB, STOOL: Fecal Occult Bld: NEGATIVE

## 2014-09-11 LAB — RETICULOCYTES
RBC.: 2.24 MIL/uL — ABNORMAL LOW (ref 4.22–5.81)
RETIC COUNT ABSOLUTE: 69.4 10*3/uL (ref 19.0–186.0)
Retic Ct Pct: 3.1 % (ref 0.4–3.1)

## 2014-09-11 LAB — LACTATE DEHYDROGENASE: LDH: 350 U/L — ABNORMAL HIGH (ref 94–250)

## 2014-09-11 LAB — HAPTOGLOBIN: Haptoglobin: <25 mg/dL — ABNORMAL LOW (ref 45–215)

## 2014-09-11 LAB — BASIC METABOLIC PANEL
Anion gap: 14 (ref 5–15)
BUN: 29 mg/dL — AB (ref 6–23)
CO2: 28 mEq/L (ref 19–32)
Calcium: 7.9 mg/dL — ABNORMAL LOW (ref 8.4–10.5)
Chloride: 94 mEq/L — ABNORMAL LOW (ref 96–112)
Creatinine, Ser: 5.04 mg/dL — ABNORMAL HIGH (ref 0.50–1.35)
GFR, EST AFRICAN AMERICAN: 12 mL/min — AB (ref >90)
GFR, EST NON AFRICAN AMERICAN: 11 mL/min — AB (ref >90)
Glucose, Bld: 82 mg/dL (ref 70–99)
Potassium: 3.4 mEq/L — ABNORMAL LOW (ref 3.7–5.3)
Sodium: 136 mEq/L — ABNORMAL LOW (ref 137–147)

## 2014-09-11 LAB — MRSA PCR SCREENING: MRSA by PCR: NEGATIVE

## 2014-09-11 LAB — HEPATITIS B SURFACE ANTIGEN: Hepatitis B Surface Ag: NEGATIVE

## 2014-09-11 LAB — FOLATE: Folate: 6.1 ng/mL

## 2014-09-11 MED ORDER — DARBEPOETIN ALFA-POLYSORBATE 200 MCG/0.4ML IJ SOLN: 200.0000 ug | INTRAMUSCULAR | Status: DC

## 2014-09-11 MED ORDER — SODIUM CHLORIDE 0.9 % IV SOLN: Freq: Once | INTRAVENOUS | Status: DC

## 2014-09-11 MED ORDER — RENA-VITE PO TABS
1.0000 | ORAL_TABLET | Freq: Every day | ORAL | Status: DC
  Administered 2014-09-11 – 2014-09-14 (×4): 1 via ORAL
  Filled 2014-09-11 (×6): qty 1

## 2014-09-11 MED ORDER — DARBEPOETIN ALFA-POLYSORBATE 200 MCG/0.4ML IJ SOLN
INTRAMUSCULAR | Status: AC
  Filled 2014-09-11: qty 0.4

## 2014-09-11 MED ORDER — DOXERCALCIFEROL 4 MCG/2ML IV SOLN
INTRAVENOUS | Status: AC
  Administered 2014-09-11: 1 ug via INTRAVENOUS
  Filled 2014-09-11: qty 2

## 2014-09-11 MED ORDER — ALLOPURINOL 100 MG PO TABS
100.0000 mg | ORAL_TABLET | Freq: Every day | ORAL | Status: DC
  Administered 2014-09-11 – 2014-09-14 (×4): 100 mg via ORAL
  Filled 2014-09-11 (×5): qty 1

## 2014-09-11 MED ORDER — DOXERCALCIFEROL 4 MCG/2ML IV SOLN
1.0000 ug | INTRAVENOUS | Status: DC
  Administered 2014-09-11 – 2014-09-14 (×2): 1 ug via INTRAVENOUS
  Filled 2014-09-11: qty 2

## 2014-09-11 NOTE — Consult Note (Signed)
I have personally seen and examined this patient and agree with the assessment/plan as outlined above by [facility] PA. PRBC transfusion at HD today with ongoing management of COPD flare.  Florita Nitsch K.,MD 09/11/2014 1:25 PM

## 2014-09-11 NOTE — Progress Notes (Signed)
UR completed 

## 2014-09-11 NOTE — Progress Notes (Signed)
Hemodialysis- Pt tolerated well, uf 1.6L total. Given 1 unit PRBCs per order as well as vancomycin. Report called to 6E, pt transported back to room.

## 2014-09-11 NOTE — Procedures (Signed)
Patient seen on Hemodialysis. QB 400, UF goal 1.5L Treatment adjusted as needed.  [name] MD [facility]. Office # [phone] Pager # [phone] 1:26 PM

## 2014-09-11 NOTE — Progress Notes (Signed)
New Admission Note:   Arrival Method: Stretcher Mental Orientation: Alert and oriented x4 Telemetry: Box #4 Assessment: Completed Skin: Intact IV: Rt AC Pain: N/A Tubes: N/A Safety Measures: Safety Fall Prevention Plan has been  Discussed          Admission: Completed [address] Orientation: Patient has been oriented to the room, unit and staff.  Family: Updated of plan  Orders have been reviewed and implemented. Will continue to monitor the patient. Call light has been placed within reach and bed alarm has been activated.   [employer], RN-BC Phone number: [phone]

## 2014-09-11 NOTE — Progress Notes (Signed)
[facility] PROGRESS NOTE  Cameron Mendez JAS:[id number] DOB: [birth year] DOA: 09/10/2014 PCP: [name], MD  Assessment/Plan: 1.  Anemia  Pancytopenia  The patient is presenting with complaints of abnormal lab with shortness of breath and tiredness without any signs of active bleeding. He has chronically low hemoglobin as well as platelets, and has not seen any hematologist for further workup lately.  Anemia work up is pending. Will call hematology consultation for further recommendations.  With All 3 cell lines being on a lower side possibility of marrow suppression, medication side effect, nutritional deficiency cannot be ruled out.  He will require further workup.  Pt received 2 units of prbc transfusion since admission.  2.ESRD  Would continue hemodialysis, consulted nephrology.  Iron supplement as well as Aranesp per nephrology.  he mentions he has been receiving heparin with his HD although I doubt that.  Monitor platelets.  Chest x-ray shows vascular congestion, patient will be receiving hemodialysis today.  3.COPD with acute exacerbation  Possible pneumonia  Patient is presenting with cough and shortness of breath without any fever. Chest x-ray shows increased vascular congestion and possible infiltrate.  He is being treated with DuoNeb, oxygen as needed, levofloxacin. Follow sputum culture. 4.Possible acute on chronic diastolic dysfunction.  Patient on hemodialysis with ESRD .   Code Status: full code.  Family Communication: none at bedside. ) Disposition Plan: pending.    Consultants:  RENAL  Procedures:  HD  Antibiotics:  levaquin  HPI/Subjective: Reports being exhausted.   Objective: Filed Vitals:   09/11/14 1716  BP: 127/61  Pulse: 63  Temp: 97.6 F (36.4 C)  Resp:     Intake/Output Summary (Last 24 hours) at 09/11/14 1722 Last data filed at 09/11/14 1716  Gross per 24 hour  Intake   1855 ml  Output   1725 ml  Net    130 ml   Filed  Weights   09/10/14 2100 09/11/14 1300 09/11/14 1716  Weight: 64 kg (141 lb 1.5 oz) 65.4 kg (144 lb 2.9 oz) 64.1 kg (141 lb 5 oz)    Exam:   General:  Alert afebrile comfortable  Cardiovascular: s1s2  Respiratory: ctab  Abdomen: soft NT NDBS+  Musculoskeletal: no pedal edema.   Data Reviewed: Basic Metabolic Panel:  Recent Labs Lab 09/10/14 1721 09/11/14 0717  NA 145 136*  K 3.8 3.4*  CL 98 94*  CO2 33* 28  GLUCOSE 88 82  BUN 23 29*  CREATININE 4.61* 5.04*  CALCIUM 8.6 7.9*   Liver Function Tests:  Recent Labs Lab 09/10/14 1721  AST 13  ALT 6  ALKPHOS 67  BILITOT 0.6  PROT 7.6  ALBUMIN 3.4*   No results found for this basename: LIPASE, AMYLASE,  in the last 168 hours No results found for this basename: AMMONIA,  in the last 168 hours CBC:  Recent Labs Lab 09/10/14 1721 09/11/14 0717  WBC 2.7* 3.6*  NEUTROABS 1.8 2.7  HGB 6.7* 7.8*  HCT 22.1* 24.3*  MCV 93.6 90.0  PLT 65* 65*   Cardiac Enzymes: No results found for this basename: CKTOTAL, CKMB, CKMBINDEX, TROPONINI,  in the last 168 hours BNP (last 3 results)  Recent Labs  09/17/13 0425 09/17/13 0747 04/27/14 0600  PROBNP 7483.0* 8483.0* 8130.0*   CBG: No results found for this basename: GLUCAP,  in the last 168 hours  Recent Results (from the past 240 hour(s))  MRSA PCR SCREENING     Status: None   Collection Time  09/10/14 10:58 PM      Result Value Ref Range Status   MRSA by PCR NEGATIVE  NEGATIVE Final   Comment:            The GeneXpert MRSA Assay (FDA     approved for NASAL specimens     only), is one component of a     comprehensive MRSA colonization     surveillance program. It is not     intended to diagnose MRSA     infection nor to guide or     monitor treatment for     MRSA infections.     Studies: Dg Chest 2 View  09/10/2014   CLINICAL DATA:  Shortness of breath, COPD exacerbation  EXAM: CHEST  2 VIEW  COMPARISON:  04/28/2014  FINDINGS: Cardiomegaly with  pulmonary vascular congestion. Superimposed right perihilar opacity, pneumonia not excluded. No pleural effusion or pneumothorax.  Right VP shunt catheter.  IMPRESSION: Cardiomegaly with pulmonary vascular congestion.  Superimposed right perihilar opacity, pneumonia not excluded.   Electronically Signed   By: Julian Hy M.D.   On: 09/10/2014 21:00    Scheduled Meds: . sodium chloride   Intravenous Once  . sodium chloride   Intravenous Once  . allopurinol  100 mg Oral QHS  . amLODipine  10 mg Oral QPM  . carvedilol  25 mg Oral BID WC  . [START ON 09/16/2014] darbepoetin (ARANESP) injection - DIALYSIS  200 mcg Intravenous Q Wed-HD  . [START ON 09/14/2014] doxercalciferol  1 mcg Intravenous Q M,W,F-HD  . guaiFENesin  600 mg Oral BID  . ipratropium-albuterol  3 mL Nebulization BID  . [START ON 09/12/2014] levofloxacin (LEVAQUIN) IV  500 mg Intravenous Q48H  . multivitamin  1 tablet Oral QHS  . pantoprazole  40 mg Oral Daily  . sodium chloride  3 mL Intravenous Q12H  . vancomycin  750 mg Intravenous Q M,W,F-HD   Continuous Infusions:   Principal Problem:   Anemia Active Problems:   Essential hypertension, benign   ESRD on hemodialysis   COPD (chronic obstructive pulmonary disease)   Other pancytopenia   Diastolic dysfunction   suspected COPD with acute exacerbation    Time spent: 25 minutes.     [facility] Pager [phone] If 7PM-7AM, please contact night-coverage at [email or web address], password [facility] 09/11/2014, 5:22 PM  LOS: 1 day

## 2014-09-11 NOTE — Consult Note (Signed)
[facility] Renal Consultation Note  Indication for Consultation:  Management of ESRD/hemodialysis; anemia, hypertension/volume and secondary hyperparathyroidism  HPI: Cameron Mendez is a 68 y.o. male admitted with  SoB and recurrent Anemia /with ho  Chronic Anemia with hgb 6.7 on 09-09-14 at [facility] center and pt reporting weakness and  SOB with Dry cough increasing intensity past several days. History of prior Hgb drop with hgb 6.0 (08/19/14) as op  Had transfusion  And now recurrent /was to have Out pt work up wit Hematologist but not seen yet.he is Blind lives with his wife and no reported Melena,or other active bleeding problems,no fever, chills, N/V, chest pain or diarrhea. Smokes half pack cigarettes dally="don't need to see to light them  Up". OP Transferrin  sat = 31%= 09/02/14.    Past Medical History  Diagnosis Date  . Anemia   . Diastolic dysfunction   . Retinitis pigmentosa     Blindness--has shunt --placed yrs ago in [place]..  . Arthritis     Gout  . Hypertension     dx--"long time"  . CKD (chronic kidney disease), stage IV     a. L upper extremity AV fistula created 07/2012.  [name] BPH (benign prostatic hyperplasia)   . Blind   . Gangrene of finger     a. 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  . Colon polyps   . Pericardial effusion     a. Noted on echo 10/2009. b. Again seen on echo 09/2013.  [name] CHF (congestive heart failure)     Past Surgical History  Procedure Laterality Date  . Cataract extraction w/ intraocular lens  implant, bilateral Bilateral   . Knee ligament reconstruction Left   . Av fistula placement  07/15/2012    Procedure: ARTERIOVENOUS (AV) FISTULA CREATION;  Surgeon: Rosetta Posner, MD;  Location: [facility];  Service: Vascular;  Laterality: Left;  . Amputation  12/30/2012    Procedure: AMPUTATION DIGIT;  Surgeon: Tennis Must, MD;  Location: [facility];  Service: Orthopedics;  Laterality: Left;  LEFT SMALL FINGER  AMPUTATION  . Pericardiocentesis  09/2013      Family History  Problem Relation Age of Onset  . Cancer Mother     OVARIAN      reports that he has been smoking Cigarettes.  He has a 25 pack-year smoking history. He has never used smokeless tobacco. He reports that he drinks alcohol. He reports that he uses illicit drugs.   Allergies  Allergen Reactions  . Ibuprofen Other (See Comments)    Kidney problems-dialysis patient  . Nsaids Other (See Comments)    Kidney problems-dialysis patient    Prior to Admission medications   Medication Sig Start Date End Date Taking? Authorizing Provider  acetaminophen (TYLENOL) 500 MG tablet Take 1,000 mg by mouth every 6 (six) hours as needed for moderate pain.   Yes Historical Provider, MD  allopurinol (ZYLOPRIM) 100 MG tablet Take 100 mg by mouth daily.    Yes Historical Provider, MD  amLODipine (NORVASC) 10 MG tablet Take 10 mg by mouth every evening.    Yes Historical Provider, MD  aspirin 81 MG tablet Take 81 mg by mouth every morning.    Yes Historical Provider, MD  bisacodyl (DULCOLAX) 5 MG EC tablet Take 5 mg by mouth daily as needed for moderate constipation.   Yes Historical Provider, MD  carvedilol (COREG) 25 MG tablet Take 25 mg by mouth 2 (two) times daily with  a meal.   Yes Historical Provider, MD  clotrimazole-betamethasone (LOTRISONE) cream Apply 1 application topically 2 (two) times daily as needed (for rash).    Yes Historical Provider, MD  colchicine 0.6 MG tablet Take 0.6 mg by mouth daily as needed. For gout   Yes Historical Provider, MD  darbepoetin (ARANESP) 200 MCG/0.4ML SOLN injection Inject 200 mcg into the vein every 7 (seven) days. Take on Mondays 08/24/14  Yes [name], DO  diphenhydrAMINE (BENADRYL) 25 MG tablet Take 25-50 mg by mouth every 6 (six) hours as needed for itching or allergies.    Yes Historical Provider, MD  doxercalciferol (HECTOROL) 4 MCG/2ML injection Inject 0.5 mLs (1 mcg total) into the vein every  Monday, Wednesday, and Friday with hemodialysis. 08/24/14  Yes [name], DO  ferric gluconate 125 mg in sodium chloride 0.9 % 100 mL Inject 125 mg into the vein every Monday, Wednesday, and Friday with hemodialysis. 08/24/14  Yes Jessica U Vann, DO  pantoprazole (PROTONIX) 40 MG tablet Take 40 mg by mouth daily. 08/24/14  Yes [name], DO  Tiotropium Bromide Monohydrate (SPIRIVA RESPIMAT) 2.5 MCG/ACT AERS Inhale 2 puffs into the lungs every morning. 08/04/14  Yes [name], MD    Continuous:  [id number], albuterol, bisacodyl, diphenhydrAMINE, ondansetron (ZOFRAN) IV, ondansetron  Results for orders placed during the hospital encounter of 09/10/14 (from the past 48 hour(s))  CBC WITH DIFFERENTIAL     Status: Abnormal   Collection Time    09/10/14  5:21 PM      Result Value Ref Range   WBC 2.7 (*) 4.0 - 10.5 K/uL   RBC 2.36 (*) 4.22 - 5.81 MIL/uL   Hemoglobin 6.7 (*) 13.0 - 17.0 g/dL   Comment: REPEATED TO VERIFY     CRITICAL RESULT CALLED TO, READ BACK BY AND VERIFIED WITH:     SARA FLACK RN AT 1809 10.1.15 BY CHRIS GRINSTEAD MLT   HCT 22.1 (*) 39.0 - 52.0 %   MCV 93.6  78.0 - 100.0 fL   MCH 28.4  26.0 - 34.0 pg   MCHC 30.3  30.0 - 36.0 g/dL   RDW 16.7 (*) 11.5 - 15.5 %   Platelets 65 (*) 150 - 400 K/uL   Comment: PLATELET COUNT CONFIRMED BY SMEAR   Neutrophils Relative % 65  43 - 77 %   Neutro Abs 1.8  1.7 - 7.7 K/uL   Lymphocytes Relative 26  12 - 46 %   Lymphs Abs 0.7  0.7 - 4.0 K/uL   Monocytes Relative 9  3 - 12 %   Monocytes Absolute 0.3  0.1 - 1.0 K/uL   Eosinophils Relative 0  0 - 5 %   Eosinophils Absolute 0.0  0.0 - 0.7 K/uL   Basophils Relative 0  0 - 1 %   Basophils Absolute 0.0  0.0 - 0.1 K/uL  COMPREHENSIVE METABOLIC PANEL     Status: Abnormal   Collection Time    09/10/14  5:21 PM      Result Value Ref Range   Sodium 145  137 - 147 mEq/L   Potassium 3.8  3.7 - 5.3 mEq/L   Chloride 98  96 - 112 mEq/L   CO2 33 (*) 19 - 32 mEq/L   Glucose,  Bld 88  70 - 99 mg/dL   BUN 23  6 - 23 mg/dL   Creatinine, Ser 4.61 (*) 0.50 - 1.35 mg/dL   Calcium 8.6  8.4 - 10.5 mg/dL  Total Protein 7.6  6.0 - 8.3 g/dL   Albumin 3.4 (*) 3.5 - 5.2 g/dL   AST 13  0 - 37 U/L   ALT 6  0 - 53 U/L   Alkaline Phosphatase 67  39 - 117 U/L   Total Bilirubin 0.6  0.3 - 1.2 mg/dL   GFR calc non Af Amer 12 (*) >90 mL/min   GFR calc Af Amer 14 (*) >90 mL/min   Comment: (NOTE)     The eGFR has been calculated using the CKD EPI equation.     This calculation has not been validated in all clinical situations.     eGFR's persistently <90 mL/min signify possible Chronic Kidney     Disease.   Anion gap 14  5 - 15  TYPE AND SCREEN     Status: None   Collection Time    09/10/14  5:40 PM      Result Value Ref Range   ABO/RH(D) O POS     Antibody Screen NEG     Sample Expiration 09/13/2014     Unit Number [id number]     Blood Component Type RED CELLS,LR     Unit division 00     Status of Unit ISSUED     Transfusion Status OK TO TRANSFUSE     Crossmatch Result Compatible    DIRECT ANTIGLOBULIN TEST     Status: None   Collection Time    09/10/14  7:29 PM      Result Value Ref Range   DAT, complement NEG     DAT, IgG NEG    PREPARE RBC (CROSSMATCH)     Status: None   Collection Time    09/10/14  8:00 PM      Result Value Ref Range   Order Confirmation ORDER PROCESSED BY BLOOD BANK    MRSA PCR SCREENING     Status: None   Collection Time    09/10/14 10:58 PM      Result Value Ref Range   MRSA by PCR NEGATIVE  NEGATIVE   Comment:            The GeneXpert MRSA Assay (FDA     approved for NASAL specimens     only), is one component of a     comprehensive MRSA colonization     surveillance program. It is not     intended to diagnose MRSA     infection nor to guide or     monitor treatment for     MRSA infections.  INFLUENZA PANEL BY PCR (TYPE A & B, H1N1)     Status: None   Collection Time    09/10/14 10:59 PM      Result Value Ref Range    Influenza A By PCR NEGATIVE  NEGATIVE   Influenza B By PCR NEGATIVE  NEGATIVE   H1N1 flu by pcr NOT DETECTED  NOT DETECTED   Comment:            The Xpert Flu assay (FDA approved for     nasal aspirates or washes and     nasopharyngeal swab specimens), is     intended as an aid in the diagnosis of     influenza and should not be used as     a sole basis for treatment.  VITAMIN B12     Status: None   Collection Time    09/10/14 11:17 PM      Result Value Ref Range  Vitamin B-12 389  211 - 911 pg/mL   Comment: Performed at [place]     Status: None   Collection Time    09/10/14 11:17 PM      Result Value Ref Range   Folate 6.1     Comment: (NOTE)     Reference Ranges            Deficient:       0.4 - 3.3 ng/mL            Indeterminate:   3.4 - 5.4 ng/mL            Normal:              > 5.4 ng/mL     Performed at [place]     Status: Abnormal   Collection Time    09/10/14 11:17 PM      Result Value Ref Range   Ferritin 1590 (*) 22 - 322 ng/mL   Comment: Performed at [place] [facility]     Status: Abnormal   Collection Time    09/10/14 11:17 PM      Result Value Ref Range   Retic Ct Pct 3.1  0.4 - 3.1 %   RBC. 2.24 (*) 4.22 - 5.81 MIL/uL   Retic Count, Manual 69.4  19.0 - 186.0 K/uL  LACTATE DEHYDROGENASE     Status: Abnormal   Collection Time    09/10/14 11:17 PM      Result Value Ref Range   LDH 350 (*) 94 - 250 U/L  TSH     Status: None   Collection Time    09/10/14 11:17 PM      Result Value Ref Range   TSH 1.200  0.350 - 4.500 uIU/mL  CBC WITH DIFFERENTIAL     Status: Abnormal   Collection Time    09/11/14  7:17 AM      Result Value Ref Range   WBC 3.6 (*) 4.0 - 10.5 K/uL   Comment: WHITE COUNT CONFIRMED ON SMEAR   RBC 2.70 (*) 4.22 - 5.81 MIL/uL   Hemoglobin 7.8 (*) 13.0 - 17.0 g/dL   HCT 24.3 (*) 39.0 - 52.0 %   MCV 90.0  78.0 - 100.0 fL   MCH 28.9  26.0 - 34.0 pg   MCHC 32.1  30.0 - 36.0 g/dL    RDW 17.6 (*) 11.5 - 15.5 %   Platelets 65 (*) 150 - 400 K/uL   Comment: REPEATED TO VERIFY     PLATELET COUNT CONFIRMED BY SMEAR   Neutrophils Relative % 73  43 - 77 %   Lymphocytes Relative 17  12 - 46 %   Monocytes Relative 9  3 - 12 %   Eosinophils Relative 0  0 - 5 %   Basophils Relative 1  0 - 1 %   Neutro Abs 2.7  1.7 - 7.7 K/uL   Lymphs Abs 0.6 (*) 0.7 - 4.0 K/uL   Monocytes Absolute 0.3  0.1 - 1.0 K/uL   Eosinophils Absolute 0.0  0.0 - 0.7 K/uL   Basophils Absolute 0.0  0.0 - 0.1 K/uL   RBC Morphology POLYCHROMASIA PRESENT     WBC Morphology ATYPICAL LYMPHOCYTES    BASIC METABOLIC PANEL     Status: Abnormal   Collection Time    09/11/14  7:17 AM      Result Value Ref Range   Sodium 136 (*) 137 -  147 mEq/L   Potassium 3.4 (*) 3.7 - 5.3 mEq/L   Chloride 94 (*) 96 - 112 mEq/L   CO2 28  19 - 32 mEq/L   Glucose, Bld 82  70 - 99 mg/dL   BUN 29 (*) 6 - 23 mg/dL   Creatinine, Ser 5.04 (*) 0.50 - 1.35 mg/dL   Calcium 7.9 (*) 8.4 - 10.5 mg/dL   GFR calc non Af Amer 11 (*) >90 mL/min   GFR calc Af Amer 12 (*) >90 mL/min   Comment: (NOTE)     The eGFR has been calculated using the CKD EPI equation.     This calculation has not been validated in all clinical situations.     eGFR's persistently <90 mL/min signify possible Chronic Kidney     Disease.   Anion gap 14  5 - 15     ROS:  See positives in hpi  Physical Exam: Filed Vitals:   09/11/14 0938  BP: 123/49  Pulse: 72  Temp: 98.2 F (36.8 C)  Resp: 18     General: Alert BM Nad ,appropriate HEENT: [place], MMM, Nonicteric Neck: no jvd Heart: RRR, No rub, mur or gallop Lungs: Scattered Wheezing with no resp distress Abdomen: bs pos. , soft , nontender, nondisteneded Extremities: no pedal edema Skin: no overt rash. Warm dry Neuro: Alert moves all extrm/ no acute deficits for him / blind Dialysis Access: Pos bruit LUAAVF  Dialysis Orders: Center: [facility]  on MWF . EDW 65.5kg HD Bath 2.0 k, 2.25 ca  Time 4hr Heparin  4000. Access L UA AVF BFR 400 DFR a1.5    Hectorol 4 mcg IV/HD, aranesp 200 q wed /HD  Venofer  0  Other HGB- 6.7<9.1<9.5<6.0   TFS 31% 09-02-14  Assessment/Plan 1. Anemia Acure On Chronic  Symptomatic- admit team wu.Hgb am 7.8/ re ck pre hd / max aranesp weekly hd wed 2. COPD Exacerbation and "possible Infiltrate CXR /possible pna- admit team rx/ nebs Levofloacin 3. ESRD -  HD MWF  4. Hypertension/volume  - 1 kg below edw by current wt will uf 2 l today on hd/ 123/49 am bp/ home meds Norvasc 43m and  Coreg 25 mg bid 5. Metabolic bone disease -  vit d on hd and binder   6. Nutrition - Renal diet  / renal vitamin/ alb 3.4  DErnest Haber PA-C [id number]/01/2014, 11:35 AM

## 2014-09-12 LAB — BASIC METABOLIC PANEL
Anion gap: 13 (ref 5–15)
BUN: 20 mg/dL (ref 6–23)
CALCIUM: 8.5 mg/dL (ref 8.4–10.5)
CHLORIDE: 95 meq/L — AB (ref 96–112)
CO2: 25 mEq/L (ref 19–32)
CREATININE: 3.87 mg/dL — AB (ref 0.50–1.35)
GFR calc Af Amer: 17 mL/min — ABNORMAL LOW (ref >90)
GFR calc non Af Amer: 15 mL/min — ABNORMAL LOW (ref >90)
Glucose, Bld: 83 mg/dL (ref 70–99)
Potassium: 3.5 mEq/L — ABNORMAL LOW (ref 3.7–5.3)
Sodium: 133 mEq/L — ABNORMAL LOW (ref 137–147)

## 2014-09-12 LAB — CBC
HCT: 26 % — ABNORMAL LOW (ref 39.0–52.0)
HEMOGLOBIN: 8.4 g/dL — AB (ref 13.0–17.0)
MCH: 28.6 pg (ref 26.0–34.0)
MCHC: 32.3 g/dL (ref 30.0–36.0)
MCV: 88.4 fL (ref 78.0–100.0)
Platelets: 52 10*3/uL — ABNORMAL LOW (ref 150–400)
RBC: 2.94 MIL/uL — ABNORMAL LOW (ref 4.22–5.81)
RDW: 17.1 % — ABNORMAL HIGH (ref 11.5–15.5)
WBC: 2.7 10*3/uL — ABNORMAL LOW (ref 4.0–10.5)

## 2014-09-12 LAB — TYPE AND SCREEN
ABO/RH(D): O POS
ANTIBODY SCREEN: NEGATIVE
UNIT DIVISION: 0
Unit division: 0

## 2014-09-12 LAB — MAGNESIUM: Magnesium: 1.8 mg/dL (ref 1.5–2.5)

## 2014-09-12 LAB — SAVE SMEAR

## 2014-09-12 MED ORDER — NICOTINE 21 MG/24HR TD PT24
21.0000 mg | MEDICATED_PATCH | Freq: Every day | TRANSDERMAL | Status: DC
  Administered 2014-09-12 – 2014-09-15 (×4): 21 mg via TRANSDERMAL
  Filled 2014-09-12 (×4): qty 1

## 2014-09-12 NOTE — Progress Notes (Signed)
Subjective:  Feeling much better, no complaints, wants to smoke a cigarette  Objective: Vital signs in last 24 hours: Temp:  [97.5 F (36.4 C)-100 F (37.8 C)] 99.1 F (37.3 C) (10/03 0625) Pulse Rate:  [61-80] 68 (10/03 0625) Resp:  [16-19] 18 (10/03 0625) BP: (118-150)/(44-63) 127/49 mmHg (10/03 0625) SpO2:  [95 %-100 %] 95 % (10/03 0625) Weight:  [64.1 kg (141 lb 5 oz)-65.4 kg (144 lb 2.9 oz)] 64.1 kg (141 lb 5 oz) (10/02 1716) Weight change: 1.4 kg (3 lb 1.4 oz)  Intake/Output from previous day: 10/02 0701 - 10/03 0700 In: 1135 [P.O.:800; Blood:335] Out: 1700 [Urine:100] Intake/Output this shift:   Lab Results:  Recent Labs  09/10/14 1721 09/11/14 0717  WBC 2.7* 3.6*  HGB 6.7* 7.8*  HCT 22.1* 24.3*  PLT 65* 65*   BMET:  Recent Labs  09/10/14 1721 09/11/14 0717  NA 145 136*  K 3.8 3.4*  CL 98 94*  CO2 33* 28  GLUCOSE 88 82  BUN 23 29*  CREATININE 4.61* 5.04*  CALCIUM 8.6 7.9*  ALBUMIN 3.4*  --    No results found for this basename: PTH,  in the last 72 hours Iron Studies:  Recent Labs  09/10/14 2317  IRON 116  TIBC 179*  FERRITIN 1590*   Studies/Results: No results found.  EXAM: General appearance:  Alert, blind AA M, in no apparent distress Resp: Scattered wheezes biaterally  Cardio:  RRR without murmur or rub GI:  + BS, soft and nontender Extremities:  No edema Access:  AVF @ LUA with + bruit  Dialysis Orders: Center: [facility] on MWF .  EDW 65.5kg HD Bath 2.0 k, 2.25 ca Time 4hr Heparin 4000. Access L UA AVF BFR 400 DFR a1.5  Hectorol 4 mcg IV/HD, aranesp 200 q wed /HD Venofer 0  Other HGB- 6.7<9.1<9.5<6.0 TFS 31% 09-02-14  Assessment/Plan: 1. Anemia - acute on chronic, symptoms better s/p 2 U PRBCs 10/1, Hgb up from 6.7 to 7.8, Fe sat 65%, Aranesp 200 mcg on Wed. 2. COPD exacerbation - CXR with R preihilar opacity, possibly PNA, still smoking, on Levaquin. 3. ESRD - HD on MWF @ [place], K 3.4.  Next HD 10/5. 4. HTN/Volume - BP 127/49,  Carvedilol 25 mg bid, Amlodipine 10 mg qhs; wt 64.1 kg s/p net UF 1.6 L yesterday.  5. Sec HPT - Ca 7.9; Hectorol 1 mcg, no binder. 6. Nutrition - renal diet, multivitamin.   LOS: 2 days   Cameron Mendez 09/12/2014,7:16 AM

## 2014-09-12 NOTE — Progress Notes (Signed)
I have personally seen and examined this patient and agree with the assessment/plan as outlined above by Lyles PA. Karman Biswell K.,MD 09/12/2014 10:49 AM

## 2014-09-12 NOTE — Progress Notes (Signed)
Patient had 14 beats of Vtach.Vitals stable,patient denies any chest pain or any kind of pain.Patient seems comfortable in bed.[name],NP notified,no orders received.Will continue to monitor. Cameron Mendez, [name], Therapist, sports

## 2014-09-12 NOTE — Progress Notes (Signed)
[facility] PROGRESS NOTE  Cameron Mendez RUE:[id number] DOB: 1946/07/13 DOA: 09/10/2014 PCP: [name], MD  Assessment/Plan:  Pancytopenia  The patient is presenting with complaints of abnormal lab with shortness of breath and tiredness without any signs of active bleeding. He has chronically low hemoglobin as well as platelets, and has not seen any hematologist for further workup lately. Patient is refusing further work up with a bone marrow biopsy at this time.  Anemia panel reveals elevated ferritin and normal iron levels.  Will discuss with family regarding hematology consultation. With All 3 cell lines being on a lower side possibility of marrow suppression, medication side effect, nutritional deficiency cannot be ruled out.   Pt received 2 units of prbc transfusion since admission.    2.ESRD  Would continue hemodialysis, consulted nephrology.  Iron supplement as well as Aranesp per nephrology.  he mentions he has been receiving heparin with his HD although I doubt that.  Monitor platelets.    3.COPD with acute exacerbation  Possible pneumonia  Patient is presenting with cough and shortness of breath without any fever. Chest x-ray shows increased vascular congestion and possible infiltrate.  He is being treated with DuoNeb, oxygen as needed, levofloxacin. Follow sputum culture. 4.Possible acute on chronic diastolic dysfunction.  Patient on hemodialysis with ESRD .   Code Status: full code.  Family Communication: none at bedside.  Disposition Plan: pending.    Consultants:  RENAL  Procedures:  HD  Antibiotics:  levaquin  HPI/Subjective: Reports being exhausted.   Objective: Filed Vitals:   09/12/14 1650  BP: 125/43  Pulse: 65  Temp: 97.8 F (36.6 C)  Resp: 16    Intake/Output Summary (Last 24 hours) at 09/12/14 1948 Last data filed at 09/12/14 1806  Gross per 24 hour  Intake    960 ml  Output    100 ml  Net    860 ml   Filed Weights   09/10/14 2100 09/11/14 1300 09/11/14 1716  Weight: 64 kg (141 lb 1.5 oz) 65.4 kg (144 lb 2.9 oz) 64.1 kg (141 lb 5 oz)    Exam:   General:  Alert afebrile comfortable  Cardiovascular: s1s2  Respiratory: ctab  Abdomen: soft NT NDBS+  Musculoskeletal: no pedal edema.   Data Reviewed: Basic Metabolic Panel:  Recent Labs Lab 09/10/14 1721 09/11/14 0717 09/12/14 1351  NA 145 136* 133*  K 3.8 3.4* 3.5*  CL 98 94* 95*  CO2 33* 28 25  GLUCOSE 88 82 83  BUN 23 29* 20  CREATININE 4.61* 5.04* 3.87*  CALCIUM 8.6 7.9* 8.5  MG  --   --  1.8   Liver Function Tests:  Recent Labs Lab 09/10/14 1721  AST 13  ALT 6  ALKPHOS 67  BILITOT 0.6  PROT 7.6  ALBUMIN 3.4*   No results found for this basename: LIPASE, AMYLASE,  in the last 168 hours No results found for this basename: AMMONIA,  in the last 168 hours CBC:  Recent Labs Lab 09/10/14 1721 09/11/14 0717 09/12/14 1024  WBC 2.7* 3.6* 2.7*  NEUTROABS 1.8 2.7  --   HGB 6.7* 7.8* 8.4*  HCT 22.1* 24.3* 26.0*  MCV 93.6 90.0 88.4  PLT 65* 65* 52*   Cardiac Enzymes: No results found for this basename: CKTOTAL, CKMB, CKMBINDEX, TROPONINI,  in the last 168 hours BNP (last 3 results)  Recent Labs  09/17/13 0425 09/17/13 0747 04/27/14 0600  PROBNP 7483.0* 8483.0* 8130.0*   CBG: No results found for this basename:  GLUCAP,  in the last 168 hours  Recent Results (from the past 240 hour(s))  MRSA PCR SCREENING     Status: None   Collection Time    09/10/14 10:58 PM      Result Value Ref Range Status   MRSA by PCR NEGATIVE  NEGATIVE Final   Comment:            The GeneXpert MRSA Assay (FDA     approved for NASAL specimens     only), is one component of a     comprehensive MRSA colonization     surveillance program. It is not     intended to diagnose MRSA     infection nor to guide or     monitor treatment for     MRSA infections.     Studies: Dg Chest 2 View  09/10/2014   CLINICAL DATA:  Shortness of  breath, COPD exacerbation  EXAM: CHEST  2 VIEW  COMPARISON:  04/28/2014  FINDINGS: Cardiomegaly with pulmonary vascular congestion. Superimposed right perihilar opacity, pneumonia not excluded. No pleural effusion or pneumothorax.  Right VP shunt catheter.  IMPRESSION: Cardiomegaly with pulmonary vascular congestion.  Superimposed right perihilar opacity, pneumonia not excluded.   Electronically Signed   By: Julian Hy M.D.   On: 09/10/2014 21:00    Scheduled Meds: . sodium chloride   Intravenous Once  . sodium chloride   Intravenous Once  . allopurinol  100 mg Oral QHS  . amLODipine  10 mg Oral QPM  . carvedilol  25 mg Oral BID WC  . [START ON 09/16/2014] darbepoetin (ARANESP) injection - DIALYSIS  200 mcg Intravenous Q Wed-HD  . [START ON 09/14/2014] doxercalciferol  1 mcg Intravenous Q M,W,F-HD  . guaiFENesin  600 mg Oral BID  . ipratropium-albuterol  3 mL Nebulization BID  . levofloxacin (LEVAQUIN) IV  500 mg Intravenous Q48H  . multivitamin  1 tablet Oral QHS  . nicotine  21 mg Transdermal Daily  . pantoprazole  40 mg Oral Daily  . sodium chloride  3 mL Intravenous Q12H  . vancomycin  750 mg Intravenous Q M,W,F-HD   Continuous Infusions:   Principal Problem:   Anemia Active Problems:   Essential hypertension, benign   ESRD on hemodialysis   COPD (chronic obstructive pulmonary disease)   Other pancytopenia   Diastolic dysfunction   suspected COPD with acute exacerbation    Time spent: 25 minutes.     [place] [facility] Pager [phone] If 7PM-7AM, please contact night-coverage at [email or web address], password [facility] 09/12/2014, 7:48 PM  LOS: 2 days

## 2014-09-13 MED ORDER — LEVOFLOXACIN 500 MG PO TABS
500.0000 mg | ORAL_TABLET | ORAL | Status: DC
  Administered 2014-09-14: 500 mg via ORAL
  Filled 2014-09-13: qty 1

## 2014-09-13 NOTE — Progress Notes (Signed)
  [facility] Progress Note  Assessment/Plan: 1. Acute on chronic anemia - Hgb 8.4 10/3 post 2 units 10/1 - Fe studies adequate - max ESA q Wed 2. ESRD -MWF - HD tomorrow 3.  Exacerbation of COPD - levaquin/Vanc/nebs - needs to stop smoking 4. Secondary hyperparathyroidism - Hectorol 1 - check P - no binder ordered 5. HTN/volume - BP ok - prone to cramping even at low volumes 6. Nutrition - renal diet + vitamin 7. Chronic thrombocytopenia/leukopenia (new) - plts 52 - decrease heparin to 1000; WBC 2.7  Myriam Jacobson, PA-C [facility] Beeper [phone] 09/13/2014,9:16 AM  LOS: 3 days   Subjective:   Wants to go home; denies problems with breathing; + cough nonproductive; intake poor  Objective Filed Vitals:   09/12/14 1650 09/12/14 2114 09/13/14 0459 09/13/14 0903  BP: 125/43 107/41 107/38 114/45  Pulse: 65 65 63 61  Temp: 97.8 F (36.6 C) 98.4 F (36.9 C) 98 F (36.7 C) 98.3 F (36.8 C)  TempSrc: Oral Oral Oral Oral  Resp: 16 15 16 16   Height:  5\' 3"  (1.6 m)    Weight:  64.9 kg (143 lb 1.3 oz)    SpO2: 93% 96% 95% 97%   Physical Exam General: NAD Heart: RRR Lungs: prolonged exp wheezes Abdomen: soft NT Extremities: no sig edema; skin leathery Dialysis Access: left upper AVF patent  Dialysis Orders: Center: [facility] on MWF .  EDW 65.5kg HD Bath 2.0 k, 2.25 ca Time 4hr Heparin 4000. Access L UA AVF BFR 400 DFR a1.5  Hectorol 4 mcg IV/HD, aranesp 200 q wed /HD Venofer 0  Other HGB- 6.7<9.1<9.5<6.0 TFS 31% 09-02-14  Additional Objective Labs: Basic Metabolic Panel:  Recent Labs Lab 09/10/14 1721 09/11/14 0717 09/12/14 1351  NA 145 136* 133*  K 3.8 3.4* 3.5*  CL 98 94* 95*  CO2 33* 28 25  GLUCOSE 88 82 83  BUN 23 29* 20  CREATININE 4.61* 5.04* 3.87*  CALCIUM 8.6 7.9* 8.5   Liver Function Tests:  Recent Labs Lab 09/10/14 1721  AST 13  ALT 6  ALKPHOS 67  BILITOT 0.6  PROT 7.6  ALBUMIN 3.4*   CBC:  Recent Labs Lab  09/10/14 1721 09/11/14 0717 09/12/14 1024  WBC 2.7* 3.6* 2.7*  NEUTROABS 1.8 2.7  --   HGB 6.7* 7.8* 8.4*  HCT 22.1* 24.3* 26.0*  MCV 93.6 90.0 88.4  PLT 65* 65* 52*  Iron Studies:   Recent Labs  09/10/14 2317  IRON 116  TIBC 179*  FERRITIN 1590*    No results found. Medications:   . sodium chloride   Intravenous Once  . sodium chloride   Intravenous Once  . allopurinol  100 mg Oral QHS  . amLODipine  10 mg Oral QPM  . carvedilol  25 mg Oral BID WC  . [START ON 09/16/2014] darbepoetin (ARANESP) injection - DIALYSIS  200 mcg Intravenous Q Wed-HD  . [START ON 09/14/2014] doxercalciferol  1 mcg Intravenous Q M,W,F-HD  . guaiFENesin  600 mg Oral BID  . ipratropium-albuterol  3 mL Nebulization BID  . levofloxacin (LEVAQUIN) IV  500 mg Intravenous Q48H  . multivitamin  1 tablet Oral QHS  . nicotine  21 mg Transdermal Daily  . pantoprazole  40 mg Oral Daily  . sodium chloride  3 mL Intravenous Q12H  . vancomycin  750 mg Intravenous Q M,W,F-HD

## 2014-09-13 NOTE — Progress Notes (Signed)
Order to obtain consent for bone marrow biopsy. Per patient, he has no idea about procedure, hence consent not obtained. Will pass information to morning RN.

## 2014-09-13 NOTE — Progress Notes (Signed)
[place] for Vancomycin, Levaquin Indication: pneumonia  Allergies  Allergen Reactions  . Ibuprofen Other (See Comments)    Kidney problems-dialysis patient  . Nsaids Other (See Comments)    Kidney problems-dialysis patient    Patient Measurements: Height: 5\' 3"  (160 cm) Weight: 143 lb 1.3 oz (64.9 kg) IBW/kg (Calculated) : 56.9  Labs:  Recent Labs  09/10/14 1721 09/11/14 0717 09/12/14 1024 09/12/14 1351  WBC 2.7* 3.6* 2.7*  --   HGB 6.7* 7.8* 8.4*  --   PLT 65* 65* 52*  --   CREATININE 4.61* 5.04*  --  3.87*    Microbiology: Recent Results (from the past 720 hour(s))  MRSA PCR SCREENING     Status: None   Collection Time    09/10/14 10:58 PM      Result Value Ref Range Status   MRSA by PCR NEGATIVE  NEGATIVE Final   Comment:            The GeneXpert MRSA Assay (FDA     approved for NASAL specimens     only), is one component of a     comprehensive MRSA colonization     surveillance program. It is not     intended to diagnose MRSA     infection nor to guide or     monitor treatment for     MRSA infections.    Medical History: Past Medical History  Diagnosis Date  . Anemia   . Diastolic dysfunction   . Retinitis pigmentosa     Blindness--has shunt --placed yrs ago in [place]..  . Arthritis     Gout  . Hypertension     dx--"long time"  . CKD (chronic kidney disease), stage IV     a. L upper extremity AV fistula created 07/2012.  [name] BPH (benign prostatic hyperplasia)   . Blind   . Gangrene of finger     a. 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  . Colon polyps   . Pericardial effusion     a. Noted on echo 10/2009. b. Again seen on echo 09/2013.  [name] CHF (congestive heart failure)     Assessment: 68 year old male with ESRD HD MWF Admitted with Pneumonia/COPD exacerbation and low HgB Afebrile, no cultures  Goal of Therapy:  Appropriate dosing  Plan:  1) Continue Levaquin 500 mg iv Q 48 hours 2)  Continue vancomcyin 750 mg IV Q HD 3) Follow up cultures, progress, fever trend  Thank you Anette Guarneri, PharmD [phone]   09/13/2014,2:07 PM

## 2014-09-13 NOTE — Progress Notes (Signed)
I have personally seen and examined this patient and agree with the assessment/plan as outlined above by [facility] PA. Cortavius Montesinos K.,MD 09/13/2014 10:51 AM

## 2014-09-13 NOTE — Progress Notes (Signed)
[facility] PROGRESS NOTE  EWART CARRERA GTX:[id number] DOB: 11-19-1946 DOA: 09/10/2014 PCP: [name], MD  Assessment/Plan:  Pancytopenia  The patient is presenting with complaints of abnormal lab with shortness of breath and tiredness without any signs of active bleeding. He has chronically low hemoglobin as well as platelets, and has not seen any hematologist for further workup lately.Anemia panel reveals elevated ferritin and normal iron levels.  Will discuss with family regarding hematology consultation. With All 3 cell lines being on a lower side possibility of marrow suppression, medication side effect, nutritional deficiency cannot be ruled out.   Pt received 2 units of prbc transfusion since admission. Spoke to Dr Julien Nordmann , recommended Bone marrow biopsy with IR, if abnormal follow up with hematologist.    2.ESRD  Would continue hemodialysis, consulted nephrology.  Iron supplement as well as Aranesp per nephrology.  he mentions he has been receiving heparin with his HD although I doubt that.  Monitor platelets.    3.COPD with acute exacerbation  Possible pneumonia  Patient is presenting with cough and shortness of breath without any fever. Chest x-ray shows increased vascular congestion and possible infiltrate.  He is being treated with DuoNeb, oxygen as needed, levofloxacin. Follow sputum culture. 4.Possible acute on chronic diastolic dysfunction.  Patient on hemodialysis with ESRD .   Code Status: full code.  Family Communication: none at bedside.  Disposition Plan: pending.    Consultants:  RENAL  Procedures:  HD  Antibiotics:  levaquin  HPI/Subjective: Reports being exhausted.   Objective: Filed Vitals:   09/13/14 0903  BP: 114/45  Pulse: 61  Temp: 98.3 F (36.8 C)  Resp: 16    Intake/Output Summary (Last 24 hours) at 09/13/14 1415 Last data filed at 09/13/14 0400  Gross per 24 hour  Intake    590 ml  Output    100 ml  Net    490 ml    Filed Weights   09/11/14 1300 09/11/14 1716 09/12/14 2114  Weight: 65.4 kg (144 lb 2.9 oz) 64.1 kg (141 lb 5 oz) 64.9 kg (143 lb 1.3 oz)    Exam:   General:  Alert afebrile comfortable  Cardiovascular: s1s2  Respiratory: ctab  Abdomen: soft NT NDBS+  Musculoskeletal: no pedal edema.   Data Reviewed: Basic Metabolic Panel:  Recent Labs Lab 09/10/14 1721 09/11/14 0717 09/12/14 1351  NA 145 136* 133*  K 3.8 3.4* 3.5*  CL 98 94* 95*  CO2 33* 28 25  GLUCOSE 88 82 83  BUN 23 29* 20  CREATININE 4.61* 5.04* 3.87*  CALCIUM 8.6 7.9* 8.5  MG  --   --  1.8   Liver Function Tests:  Recent Labs Lab 09/10/14 1721  AST 13  ALT 6  ALKPHOS 67  BILITOT 0.6  PROT 7.6  ALBUMIN 3.4*   No results found for this basename: LIPASE, AMYLASE,  in the last 168 hours No results found for this basename: AMMONIA,  in the last 168 hours CBC:  Recent Labs Lab 09/10/14 1721 09/11/14 0717 09/12/14 1024  WBC 2.7* 3.6* 2.7*  NEUTROABS 1.8 2.7  --   HGB 6.7* 7.8* 8.4*  HCT 22.1* 24.3* 26.0*  MCV 93.6 90.0 88.4  PLT 65* 65* 52*   Cardiac Enzymes: No results found for this basename: CKTOTAL, CKMB, CKMBINDEX, TROPONINI,  in the last 168 hours BNP (last 3 results)  Recent Labs  09/17/13 0425 09/17/13 0747 04/27/14 0600  PROBNP 7483.0* 8483.0* 8130.0*   CBG: No results found for  this basename: GLUCAP,  in the last 168 hours  Recent Results (from the past 240 hour(s))  MRSA PCR SCREENING     Status: None   Collection Time    09/10/14 10:58 PM      Result Value Ref Range Status   MRSA by PCR NEGATIVE  NEGATIVE Final   Comment:            The GeneXpert MRSA Assay (FDA     approved for NASAL specimens     only), is one component of a     comprehensive MRSA colonization     surveillance program. It is not     intended to diagnose MRSA     infection nor to guide or     monitor treatment for     MRSA infections.     Studies: No results found.  Scheduled Meds: .  sodium chloride   Intravenous Once  . sodium chloride   Intravenous Once  . allopurinol  100 mg Oral QHS  . amLODipine  10 mg Oral QPM  . carvedilol  25 mg Oral BID WC  . [START ON 09/16/2014] darbepoetin (ARANESP) injection - DIALYSIS  200 mcg Intravenous Q Wed-HD  . [START ON 09/14/2014] doxercalciferol  1 mcg Intravenous Q M,W,F-HD  . guaiFENesin  600 mg Oral BID  . ipratropium-albuterol  3 mL Nebulization BID  . [START ON 09/14/2014] levofloxacin  500 mg Oral Q48H  . multivitamin  1 tablet Oral QHS  . nicotine  21 mg Transdermal Daily  . pantoprazole  40 mg Oral Daily  . sodium chloride  3 mL Intravenous Q12H  . vancomycin  750 mg Intravenous Q M,W,F-HD   Continuous Infusions:   Principal Problem:   Anemia Active Problems:   Essential hypertension, benign   ESRD on hemodialysis   COPD (chronic obstructive pulmonary disease)   Other pancytopenia   Diastolic dysfunction   suspected COPD with acute exacerbation    Time spent: 25 minutes.     [facility] Pager [phone] If 7PM-7AM, please contact night-coverage at [email or web address], password [facility] 09/13/2014, 2:15 PM  LOS: 3 days

## 2014-09-14 ENCOUNTER — Encounter (HOSPITAL_COMMUNITY): Payer: Self-pay | Admitting: Radiology

## 2014-09-14 ENCOUNTER — Inpatient Hospital Stay (HOSPITAL_COMMUNITY): Payer: Medicare Other

## 2014-09-14 LAB — CBC WITH DIFFERENTIAL/PLATELET
Basophils Absolute: 0 10*3/uL (ref 0.0–0.1)
Basophils Relative: 0 % (ref 0–1)
EOS ABS: 0 10*3/uL (ref 0.0–0.7)
EOS PCT: 0 % (ref 0–5)
HCT: 31 % — ABNORMAL LOW (ref 39.0–52.0)
Hemoglobin: 10 g/dL — ABNORMAL LOW (ref 13.0–17.0)
LYMPHS ABS: 0.5 10*3/uL — AB (ref 0.7–4.0)
Lymphocytes Relative: 20 % (ref 12–46)
MCH: 28.4 pg (ref 26.0–34.0)
MCHC: 32.3 g/dL (ref 30.0–36.0)
MCV: 88.1 fL (ref 78.0–100.0)
Monocytes Absolute: 0.1 10*3/uL (ref 0.1–1.0)
Monocytes Relative: 6 % (ref 3–12)
Neutro Abs: 1.7 10*3/uL (ref 1.7–7.7)
Neutrophils Relative %: 74 % (ref 43–77)
Platelets: 61 10*3/uL — ABNORMAL LOW (ref 150–400)
RBC: 3.52 MIL/uL — ABNORMAL LOW (ref 4.22–5.81)
RDW: 16.5 % — AB (ref 11.5–15.5)
WBC: 2.3 10*3/uL — ABNORMAL LOW (ref 4.0–10.5)

## 2014-09-14 LAB — RENAL FUNCTION PANEL
Albumin: 3 g/dL — ABNORMAL LOW (ref 3.5–5.2)
Anion gap: 14 (ref 5–15)
BUN: 42 mg/dL — ABNORMAL HIGH (ref 6–23)
CO2: 24 mEq/L (ref 19–32)
Calcium: 8.2 mg/dL — ABNORMAL LOW (ref 8.4–10.5)
Chloride: 94 mEq/L — ABNORMAL LOW (ref 96–112)
Creatinine, Ser: 6.39 mg/dL — ABNORMAL HIGH (ref 0.50–1.35)
GFR calc Af Amer: 9 mL/min — ABNORMAL LOW (ref >90)
GFR calc non Af Amer: 8 mL/min — ABNORMAL LOW (ref >90)
Glucose, Bld: 84 mg/dL (ref 70–99)
Phosphorus: 3.9 mg/dL (ref 2.3–4.6)
Potassium: 3.6 mEq/L — ABNORMAL LOW (ref 3.7–5.3)
Sodium: 132 mEq/L — ABNORMAL LOW (ref 137–147)

## 2014-09-14 LAB — CBC
HCT: 29.2 % — ABNORMAL LOW (ref 39.0–52.0)
Hemoglobin: 9.8 g/dL — ABNORMAL LOW (ref 13.0–17.0)
MCH: 28 pg (ref 26.0–34.0)
MCHC: 33.6 g/dL (ref 30.0–36.0)
MCV: 83.4 fL (ref 78.0–100.0)
PLATELETS: 121 10*3/uL — AB (ref 150–400)
RBC: 3.5 MIL/uL — ABNORMAL LOW (ref 4.22–5.81)
RDW: 17.9 % — ABNORMAL HIGH (ref 11.5–15.5)
WBC: 7.3 10*3/uL (ref 4.0–10.5)

## 2014-09-14 LAB — PROTIME-INR
INR: 1.52 — ABNORMAL HIGH (ref 0.00–1.49)
PROTHROMBIN TIME: 18.3 s — AB (ref 11.6–15.2)

## 2014-09-14 LAB — GLUCOSE, CAPILLARY: Glucose-Capillary: 104 mg/dL — ABNORMAL HIGH (ref 70–99)

## 2014-09-14 LAB — APTT: APTT: 38 s — AB (ref 24–37)

## 2014-09-14 MED ORDER — LIDOCAINE-PRILOCAINE 2.5-2.5 % EX CREA: 1.0000 "application " | TOPICAL_CREAM | CUTANEOUS | Status: DC | PRN

## 2014-09-14 MED ORDER — NEPRO/CARBSTEADY PO LIQD: 237.0000 mL | ORAL | Status: DC | PRN

## 2014-09-14 MED ORDER — HEPARIN SODIUM (PORCINE) 1000 UNIT/ML DIALYSIS: 1000.0000 [IU] | Freq: Once | INTRAMUSCULAR | Status: DC

## 2014-09-14 MED ORDER — LIDOCAINE HCL (PF) 1 % IJ SOLN: 5.0000 mL | INTRAMUSCULAR | Status: DC | PRN

## 2014-09-14 MED ORDER — HEPARIN SODIUM (PORCINE) 1000 UNIT/ML DIALYSIS
1000.0000 [IU] | INTRAMUSCULAR | Status: DC | PRN
Start: 2014-09-14 — End: 2014-09-14

## 2014-09-14 MED ORDER — PENTAFLUOROPROP-TETRAFLUOROETH EX AERO: 1.0000 "application " | INHALATION_SPRAY | CUTANEOUS | Status: DC | PRN

## 2014-09-14 MED ORDER — DOXERCALCIFEROL 4 MCG/2ML IV SOLN
INTRAVENOUS | Status: AC
  Administered 2014-09-14: 1 ug via INTRAVENOUS
  Filled 2014-09-14: qty 2

## 2014-09-14 MED ORDER — ALTEPLASE 2 MG IJ SOLR: 2.0000 mg | Freq: Once | INTRAMUSCULAR | Status: DC | PRN

## 2014-09-14 MED ORDER — SODIUM CHLORIDE 0.9 % IV SOLN: 100.0000 mL | INTRAVENOUS | Status: DC | PRN

## 2014-09-14 NOTE — Progress Notes (Signed)
Subjective:  Seen on dialysis, no complaints  Objective: Vital signs in last 24 hours: Temp:  [97.4 F (36.3 C)-98.7 F (37.1 C)] 97.7 F (36.5 C) (10/05 0659) Pulse Rate:  [61-90] 62 (10/05 1000) Resp:  [16-18] 18 (10/05 0709) BP: (110-143)/(42-60) 125/54 mmHg (10/05 1000) SpO2:  [96 %-98 %] 96 % (10/05 0514) Weight:  [65.2 kg (143 lb 11.8 oz)-65.3 kg (143 lb 15.4 oz)] 65.2 kg (143 lb 11.8 oz) (10/05 0659) Weight change: 0.4 kg (14.1 oz)  Intake/Output from previous day: 10/04 0701 - 10/05 0700 In: 3 [I.V.:3] Out: 425 [Urine:425] Intake/Output this shift:   Lab Results:  Recent Labs  09/12/14 1024 09/14/14 0500  WBC 2.7* 7.3  HGB 8.4* 9.8*  HCT 26.0* 29.2*  PLT 52* 121*   BMET:  Recent Labs  09/12/14 1351 09/14/14 0717  NA 133* 132*  K 3.5* 3.6*  CL 95* 94*  CO2 25 24  GLUCOSE 83 84  BUN 20 42*  CREATININE 3.87* 6.39*  CALCIUM 8.5 8.2*  ALBUMIN  --  3.0*   No results found for this basename: PTH,  in the last 72 hours Iron Studies: No results found for this basename: IRON, TIBC, TRANSFERRIN, FERRITIN,  in the last 72 hours  Studies/Results: No results found.  EXAM:  General appearance: Alert, blind AA M, in no apparent distress  Resp: Scattered wheezes biaterally  Cardio: RRR without murmur or rub  GI: + BS, soft and nontender  Extremities: No edema  Access: AVF @ LUA with BFR 350   Dialysis Orders: Center: [facility] on MWF .  EDW 65.5kg HD Bath 2.0 k, 2.25 ca Time 4hr Heparin 4000. Access L UA AVF BFR 400 DFR a1.5  Hectorol 4 mcg IV/HD, aranesp 200 q wed /HD Venofer 0  Other HGB- 6.7<9.1<9.5<6.0 TFS 31% 09-02-14  Assessment/Plan: 1. Anemia - acute on chronic, symptoms better s/p 2 U PRBCs 10/1, Hgb up from 6.7 to 7.8, Fe sat 65%, Aranesp 200 mcg on Wed. CBC today is much better, maybe lab error, will repeat  2. COPD exacerbation - CXR with R perihilar opacity, possibly PNA, still smoking, on Levaquin.  3. ESRD - HD on MWF @ [place], K 3.6. HD  today. 4. HTN/Volume - BP 113/53, Carvedilol 25 mg bid, Amlodipine 10 mg qhs; wt 63.7 kg with UF goal 1 L. 5. Sec HPT - Ca 8.2 (9 corrected), P 3.9; Hectorol 1 mcg, no binder.  6. Nutrition - Alb 3, renal diet, multivitamin.     LOS: 4 days   LYLES,CHARLES 09/14/2014,10:36 AM  Pt seen, examined, agree w assess/plan as above with additions as indicated.  Kelly Splinter MD pager [phone]    cell [phone] 09/14/2014, 1:47 PM

## 2014-09-14 NOTE — Progress Notes (Signed)
[facility] PROGRESS NOTE  SIE FORMISANO VZC:[id number] DOB: May 28, 1946 DOA: 09/10/2014 PCP: [name], MD  Assessment/Plan:  Pancytopenia  The patient is presenting with complaints of abnormal lab with shortness of breath and tiredness without any signs of active bleeding. He has chronically low hemoglobin as well as platelets, and has not seen any hematologist for further workup lately.Anemia panel reveals elevated ferritin and normal iron levels.  With All 3 cell lines being on a lower side possibility of marrow suppression, medication side effect, nutritional deficiency cannot be ruled out.   Pt received 2 units of prbc transfusion since admission. Spoke to Dr Julien Nordmann , recommended Bone marrow biopsy with IR, if abnormal follow up with hematologist.    .ESRD  Would continue hemodialysis, consulted nephrology.  Iron supplement as well as Aranesp per nephrology.     COPD with acute exacerbation / questionable pneumonia. Patient is presenting with cough and shortness of breath without any fever. Chest x-ray shows increased vascular congestion and possible infiltrate.  He is being treated with DuoNeb, oxygen as needed, levofloxacin. Follow sputum culture.  4.Possible acute on chronic diastolic dysfunction.  Patient on hemodialysis with ESRD .   Code Status: full code.  Family Communication: none at bedside.  Disposition Plan: pending.    Consultants:  RENAL  IR  Procedures:  HD  Antibiotics:  levaquin  HPI/Subjective: Is alert and cheerful. Wants to go ahead with the procedure.   Objective: Filed Vitals:   09/14/14 1211  BP: 124/44  Pulse: 62  Temp: 98.6 F (37 C)  Resp: 18    Intake/Output Summary (Last 24 hours) at 09/14/14 1739 Last data filed at 09/14/14 1112  Gross per 24 hour  Intake      3 ml  Output   1425 ml  Net  -1422 ml   Filed Weights   09/13/14 2051 09/14/14 0659 09/14/14 1112  Weight: 65.3 kg (143 lb 15.4 oz) 65.2 kg (143 lb  11.8 oz) 63.7 kg (140 lb 6.9 oz)    Exam:   General:  Alert afebrile comfortable  Cardiovascular: s1s2  Respiratory: ctab  Abdomen: soft NT NDBS+  Musculoskeletal: no pedal edema.   Data Reviewed: Basic Metabolic Panel:  Recent Labs Lab 09/10/14 1721 09/11/14 0717 09/12/14 1351 09/14/14 0717  NA 145 136* 133* 132*  K 3.8 3.4* 3.5* 3.6*  CL 98 94* 95* 94*  CO2 33* 28 25 24   GLUCOSE 88 82 83 84  BUN 23 29* 20 42*  CREATININE 4.61* 5.04* 3.87* 6.39*  CALCIUM 8.6 7.9* 8.5 8.2*  MG  --   --  1.8  --   PHOS  --   --   --  3.9   Liver Function Tests:  Recent Labs Lab 09/10/14 1721 09/14/14 0717  AST 13  --   ALT 6  --   ALKPHOS 67  --   BILITOT 0.6  --   PROT 7.6  --   ALBUMIN 3.4* 3.0*   No results found for this basename: LIPASE, AMYLASE,  in the last 168 hours No results found for this basename: AMMONIA,  in the last 168 hours CBC:  Recent Labs Lab 09/10/14 1721 09/11/14 0717 09/12/14 1024 09/14/14 0500 09/14/14 1350  WBC 2.7* 3.6* 2.7* 7.3 2.3*  NEUTROABS 1.8 2.7  --   --  1.7  HGB 6.7* 7.8* 8.4* 9.8* 10.0*  HCT 22.1* 24.3* 26.0* 29.2* 31.0*  MCV 93.6 90.0 88.4 83.4 88.1  PLT 65* 65* 52* 121* 61*  Cardiac Enzymes: No results found for this basename: CKTOTAL, CKMB, CKMBINDEX, TROPONINI,  in the last 168 hours BNP (last 3 results)  Recent Labs  09/17/13 0425 09/17/13 0747 04/27/14 0600  PROBNP 7483.0* 8483.0* 8130.0*   CBG: No results found for this basename: GLUCAP,  in the last 168 hours  Recent Results (from the past 240 hour(s))  MRSA PCR SCREENING     Status: None   Collection Time    09/10/14 10:58 PM      Result Value Ref Range Status   MRSA by PCR NEGATIVE  NEGATIVE Final   Comment:            The GeneXpert MRSA Assay (FDA     approved for NASAL specimens     only), is one component of a     comprehensive MRSA colonization     surveillance program. It is not     intended to diagnose MRSA     infection nor to guide or      monitor treatment for     MRSA infections.     Studies: No results found.  Scheduled Meds: . sodium chloride   Intravenous Once  . sodium chloride   Intravenous Once  . allopurinol  100 mg Oral QHS  . amLODipine  10 mg Oral QPM  . carvedilol  25 mg Oral BID WC  . [START ON 09/16/2014] darbepoetin (ARANESP) injection - DIALYSIS  200 mcg Intravenous Q Wed-HD  . doxercalciferol  1 mcg Intravenous Q M,W,F-HD  . guaiFENesin  600 mg Oral BID  . ipratropium-albuterol  3 mL Nebulization BID  . levofloxacin  500 mg Oral Q48H  . multivitamin  1 tablet Oral QHS  . nicotine  21 mg Transdermal Daily  . pantoprazole  40 mg Oral Daily  . sodium chloride  3 mL Intravenous Q12H  . vancomycin  750 mg Intravenous Q M,W,F-HD   Continuous Infusions:   Principal Problem:   Anemia Active Problems:   Essential hypertension, benign   ESRD on hemodialysis   COPD (chronic obstructive pulmonary disease)   Other pancytopenia   Diastolic dysfunction   suspected COPD with acute exacerbation    Time spent: 25 minutes.     [facility] Pager [phone] If 7PM-7AM, please contact night-coverage at [email or web address], password [facility] 09/14/2014, 5:39 PM  LOS: 4 days

## 2014-09-14 NOTE — H&P (Signed)
Chief Complaint: Chief Complaint  Patient presents with  . Abnormal Lab    Referring Physician(s): Dr. Karleen Hampshire   History of Present Illness: Cameron Mendez is a 68 y.o. male with pancytopenia, IR received request for bone marrow biopsy. He denies any active chest pain, shortness of breath or palpitations. He denies any active signs of bleeding or excessive bruising. He has previously tolerated sedation without complications during a colonoscopy.   Past Medical History  Diagnosis Date  . Anemia   . Diastolic dysfunction   . Retinitis pigmentosa     Blindness--has shunt --placed yrs ago in [place]..  . Arthritis     Gout  . Hypertension     dx--"long time"  . CKD (chronic kidney disease), stage IV     a. L upper extremity AV fistula created 07/2012.  [name] BPH (benign prostatic hyperplasia)   . Blind   . Gangrene of finger     a. 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  . Colon polyps   . Pericardial effusion     a. Noted on echo 10/2009. b. Again seen on echo 09/2013.  [name] CHF (congestive heart failure)     Past Surgical History  Procedure Laterality Date  . Cataract extraction w/ intraocular lens  implant, bilateral Bilateral   . Knee ligament reconstruction Left   . Av fistula placement  07/15/2012    Procedure: ARTERIOVENOUS (AV) FISTULA CREATION;  Surgeon: Rosetta Posner, MD;  Location: [facility];  Service: Vascular;  Laterality: Left;  . Amputation  12/30/2012    Procedure: AMPUTATION DIGIT;  Surgeon: Tennis Must, MD;  Location: [facility];  Service: Orthopedics;  Laterality: Left;  LEFT SMALL FINGER AMPUTATION  . Pericardiocentesis  09/2013    Allergies: Ibuprofen and Nsaids  Medications: Prior to Admission medications   Medication Sig Start Date End Date Taking? Authorizing Provider  acetaminophen (TYLENOL) 500 MG tablet Take 1,000 mg by mouth every 6 (six) hours as needed for moderate pain.   Yes Historical Provider, MD  allopurinol (ZYLOPRIM) 100  MG tablet Take 100 mg by mouth daily.    Yes Historical Provider, MD  amLODipine (NORVASC) 10 MG tablet Take 10 mg by mouth every evening.    Yes Historical Provider, MD  aspirin 81 MG tablet Take 81 mg by mouth every morning.    Yes Historical Provider, MD  bisacodyl (DULCOLAX) 5 MG EC tablet Take 5 mg by mouth daily as needed for moderate constipation.   Yes Historical Provider, MD  carvedilol (COREG) 25 MG tablet Take 25 mg by mouth 2 (two) times daily with a meal.   Yes Historical Provider, MD  clotrimazole-betamethasone (LOTRISONE) cream Apply 1 application topically 2 (two) times daily as needed (for rash).    Yes Historical Provider, MD  colchicine 0.6 MG tablet Take 0.6 mg by mouth daily as needed. For gout   Yes Historical Provider, MD  darbepoetin (ARANESP) 200 MCG/0.4ML SOLN injection Inject 200 mcg into the vein every 7 (seven) days. Take on Mondays 08/24/14  Yes [name], DO  diphenhydrAMINE (BENADRYL) 25 MG tablet Take 25-50 mg by mouth every 6 (six) hours as needed for itching or allergies.    Yes Historical Provider, MD  doxercalciferol (HECTOROL) 4 MCG/2ML injection Inject 0.5 mLs (1 mcg total) into the vein every Monday, Wednesday, and Friday with hemodialysis. 08/24/14  Yes [name], DO  ferric gluconate 125 mg in sodium chloride 0.9 % 100 mL Inject  125 mg into the vein every Monday, Wednesday, and Friday with hemodialysis. 08/24/14  Yes Jessica U Vann, DO  pantoprazole (PROTONIX) 40 MG tablet Take 40 mg by mouth daily. 08/24/14  Yes [name], DO  Tiotropium Bromide Monohydrate (SPIRIVA RESPIMAT) 2.5 MCG/ACT AERS Inhale 2 puffs into the lungs every morning. 08/04/14  Yes [name], MD    Family History  Problem Relation Age of Onset  . Cancer Mother     OVARIAN    History   Social History  . Marital Status: Married    Spouse Name: N/A    Number of Children: N/A  . Years of Education: N/A   Occupational History  . unemployeed    Social History Main  Topics  . Smoking status: Current Every Day Smoker -- 0.50 packs/day for 50 years    Types: Cigarettes  . Smokeless tobacco: Never Used  . Alcohol Use: Yes     Comment: "quit driinking in ~ 2000"  . Drug Use: Yes  . Sexual Activity: Not Currently   Other Topics Concern  . None   Social History Narrative  . None    Review of Systems: A 12 point ROS discussed and pertinent positives are indicated in the HPI above.  All other systems are negative.  Review of Systems  Vital Signs: BP 124/44  Pulse 62  Temp(Src) 98.6 F (37 C) (Oral)  Resp 18  Ht _0  (1.6 m)  Wt 140 lb 6.9 oz (63.7 kg)  BMI 24.88 kg/m2  SpO2 96%  Physical Exam  Constitutional: He is oriented to person, place, and time. No distress.  HENT:  Head: Normocephalic and atraumatic.  Cardiovascular: Normal rate and regular rhythm.  Exam reveals no gallop and no friction rub.   No murmur heard. Pulmonary/Chest: Effort normal and breath sounds normal. No respiratory distress. He has no wheezes. He has no rales.  Abdominal: Soft. Bowel sounds are normal. He exhibits no distension. There is no tenderness.  Neurological: He is alert and oriented to person, place, and time.  Skin: He is not diaphoretic.  General: Blind AA male in NAD, A&Ox3  Imaging: Dg Chest 2 View  09/10/2014   CLINICAL DATA:  Shortness of breath, COPD exacerbation  EXAM: CHEST  2 VIEW  COMPARISON:  04/28/2014  FINDINGS: Cardiomegaly with pulmonary vascular congestion. Superimposed right perihilar opacity, pneumonia not excluded. No pleural effusion or pneumothorax.  Right VP shunt catheter.  IMPRESSION: Cardiomegaly with pulmonary vascular congestion.  Superimposed right perihilar opacity, pneumonia not excluded.   Electronically Signed   By: Julian Hy M.D.   On: 09/10/2014 21:00    Labs:  CBC:  Recent Labs  09/11/14 0717 09/12/14 1024 09/14/14 0500 09/14/14 1350  WBC 3.6* 2.7* 7.3 2.3*  HGB 7.8* 8.4* 9.8* 10.0*  HCT 24.3* 26.0*  29.2* 31.0*  PLT 65* 52* 121* 61*    COAGS:  Recent Labs  09/14/14 0500  INR 1.52*  APTT 38*    BMP:  Recent Labs  09/10/14 1721 09/11/14 0717 09/12/14 1351 09/14/14 0717  NA 145 136* 133* 132*  K 3.8 3.4* 3.5* 3.6*  CL 98 94* 95* 94*  CO2 33* _1 GLUCOSE 88 82 83 84  BUN 23 29* 20 42*  CALCIUM 8.6 7.9* 8.5 8.2*  CREATININE 4.61* 5.04* 3.87* 6.39*  GFRNONAA 12* 11* 15* 8*  GFRAA 14* 12* 17* 9*    LIVER FUNCTION TESTS:  Recent Labs  09/17/13 0747  04/27/14 0600  04/29/14 0724 05/01/14 0803 09/10/14 1721 09/14/14 0717  BILITOT 0.4  --  0.4  --   --  0.6  --   AST 11  --  15  --   --  13  --   ALT 9  --  12  --   --  6  --   ALKPHOS 79  --  64  --   --  67  --   PROT 8.4*  --  7.4  --   --  7.6  --   ALBUMIN 3.8  < > 3.5 3.3* 3.3* 3.4* 3.0*  < > = values in this interval not displayed.  TUMOR MARKERS: No results found for this basename: AFPTM, CEA, CA199, CHROMGRNA,  in the last 8760 hours  Assessment and Plan: Pancytopenia Request for image guided bone marrow biopsy with moderate sedation Patient will be NPO after midnight, labs ordered Risks and Benefits discussed with the patient. All of the patient's questions were answered, patient is agreeable to proceed. Consent signed and in chart.  COPD with acute exacerbation  On levaquin/Vancomycin for possible HCAP.  ESRD on HD    Signed: Hedy Jacob 09/14/2014, 3:48 PM

## 2014-09-15 ENCOUNTER — Ambulatory Visit (HOSPITAL_COMMUNITY)
Admission: EM | Admit: 2014-09-15 | Discharge: 2014-09-15 | Disposition: A | Discharge status: Home / Self Care | Payer: Medicare Other | Source: Home / Self Care | Attending: Radiology | Admitting: Radiology

## 2014-09-15 LAB — PROTEIN ELECTROPHORESIS, SERUM
ALBUMIN ELP: 50.4 % — AB (ref 55.8–66.1)
Alpha-1-Globulin: 6.3 % — ABNORMAL HIGH (ref 2.9–4.9)
Alpha-2-Globulin: 6.3 % — ABNORMAL LOW (ref 7.1–11.8)
Beta 2: 8.4 % — ABNORMAL HIGH (ref 3.2–6.5)
Beta Globulin: 6.5 % (ref 4.7–7.2)
GAMMA GLOBULIN: 22.1 % — AB (ref 11.1–18.8)
M-Spike, %: NOT DETECTED g/dL
Total Protein ELP: 6.2 g/dL (ref 6.0–8.3)

## 2014-09-15 LAB — CBC
HCT: 28 % — ABNORMAL LOW (ref 39.0–52.0)
Hemoglobin: 8.8 g/dL — ABNORMAL LOW (ref 13.0–17.0)
MCH: 28.6 pg (ref 26.0–34.0)
MCHC: 31.4 g/dL (ref 30.0–36.0)
MCV: 90.9 fL (ref 78.0–100.0)
Platelets: 57 10*3/uL — ABNORMAL LOW (ref 150–400)
RBC: 3.08 MIL/uL — ABNORMAL LOW (ref 4.22–5.81)
RDW: 16.7 % — AB (ref 11.5–15.5)
WBC: 2.8 10*3/uL — AB (ref 4.0–10.5)

## 2014-09-15 LAB — UIFE/LIGHT CHAINS/TP QN, 24-HR UR
ALPHA 2 UR: DETECTED — AB
Albumin, U: DETECTED
Alpha 1, Urine: DETECTED — AB
BETA UR: DETECTED — AB
GAMMA UR: DETECTED — AB
TOTAL PROTEIN, URINE-UPE24: 49 mg/dL — AB (ref 5–25)

## 2014-09-15 LAB — PROTIME-INR
INR: 1.22 (ref 0.00–1.49)
Prothrombin Time: 15.4 seconds — ABNORMAL HIGH (ref 11.6–15.2)

## 2014-09-15 LAB — BONE MARROW EXAM

## 2014-09-15 MED ORDER — LIDOCAINE HCL 1 % IJ SOLN
INTRAMUSCULAR | Status: AC
  Filled 2014-09-15: qty 20

## 2014-09-15 MED ORDER — MIDAZOLAM HCL 2 MG/2ML IJ SOLN
INTRAMUSCULAR | Status: AC | PRN
  Administered 2014-09-15: 1 mg via INTRAVENOUS

## 2014-09-15 MED ORDER — FENTANYL CITRATE 0.05 MG/ML IJ SOLN
INTRAMUSCULAR | Status: AC | PRN
  Administered 2014-09-15: 50 ug via INTRAVENOUS

## 2014-09-15 MED ORDER — RENA-VITE PO TABS: 1.0000 | ORAL_TABLET | Freq: Every day | ORAL | Status: DC

## 2014-09-15 MED ORDER — IPRATROPIUM-ALBUTEROL 0.5-2.5 (3) MG/3ML IN SOLN: 3.0000 mL | RESPIRATORY_TRACT | Status: DC | PRN

## 2014-09-15 MED ORDER — MIDAZOLAM HCL 2 MG/2ML IJ SOLN
INTRAMUSCULAR | Status: AC
  Filled 2014-09-15: qty 2

## 2014-09-15 MED ORDER — FENTANYL CITRATE 0.05 MG/ML IJ SOLN
INTRAMUSCULAR | Status: AC
  Filled 2014-09-15: qty 4

## 2014-09-15 MED ORDER — GUAIFENESIN ER 600 MG PO TB12: 600.0000 mg | ORAL_TABLET | Freq: Two times a day (BID) | ORAL | Status: DC

## 2014-09-15 NOTE — Sedation Documentation (Signed)
Dr. Laurence Ferrari obtaining sample, pt denied pain.  VSS.

## 2014-09-15 NOTE — Progress Notes (Signed)
Subjective:  Comfortable in bed, no complaints  Objective: Vital signs in last 24 hours: Temp:  [97.5 F (36.4 C)-99.3 F (37.4 C)] 98.2 F (36.8 C) (10/06 0442) Pulse Rate:  [59-71] 59 (10/06 0442) Resp:  [17-18] 17 (10/06 0442) BP: (106-143)/(44-66) 106/44 mmHg (10/06 0442) SpO2:  [95 %-98 %] 96 % (10/06 0442) Weight:  [63.7 kg (140 lb 6.9 oz)-64 kg (141 lb 1.5 oz)] 64 kg (141 lb 1.5 oz) (10/05 2147) Weight change: -1.6 kg (-3 lb 8.4 oz)  Intake/Output from previous day: 10/05 0701 - 10/06 0700 In: 480 [P.O.:480] Out: 1200 [Urine:200] Intake/Output this shift:   Lab Results:  Recent Labs  09/14/14 1350 09/15/14 0430  WBC 2.3* 2.8*  HGB 10.0* 8.8*  HCT 31.0* 28.0*  PLT 61* 57*   BMET:  Recent Labs  09/12/14 1351 09/14/14 0717  NA 133* 132*  K 3.5* 3.6*  CL 95* 94*  CO2 25 24  GLUCOSE 83 84  BUN 20 42*  CREATININE 3.87* 6.39*  CALCIUM 8.5 8.2*  ALBUMIN  --  3.0*   No results found for this basename: PTH,  in the last 72 hours Iron Studies: No results found for this basename: IRON, TIBC, TRANSFERRIN, FERRITIN,  in the last 72 hours  Studies/Results: Dg Chest 2 View  09/14/2014   CLINICAL DATA:  Cough and shortness of breath with history of COPD, dialysis dependent renal insufficiency, and CHF.  EXAM: CHEST  2 VIEW  COMPARISON:  PA and lateral chest x-ray of September 10, 2014.  FINDINGS: The lungs are mildly hyperinflated. There is no focal infiltrate. The cardiopericardial silhouette is enlarged. The central pulmonary vascularity is mildly prominent though stable. There is no pleural effusion. A portion of the patient's ventriculoperitoneal shunt tube is visible and appears intact. There is a second tubular structure extending from the posterior aspect of the right seventh rib inferiorly which may reflect an unused shunt fragment. This is stable. The bony thorax is unremarkable.  IMPRESSION: Low-grade compensated CHF superimposed upon COPD. There is no evidence of  pneumonia nor pulmonary alveolar edema.   Electronically Signed   By: David  Jordan   On: 09/14/2014 19:34   EXAM:  General appearance: Alert, blind AA M, in no apparent distress  Resp: Few scattered wheezes, but mostly clear Cardio: RRR without murmur or rub  GI: + BS, soft and nontender  Extremities: No edema  Access: AVF @ LUA with + bruit  Dialysis Orders: Center: [facility] on MWF .  EDW 65.5kg HD Bath 2.0 k, 2.25 ca Time 4hr Heparin 4000. Access L UA AVF BFR 400 DFR a1.5  Hectorol 4 mcg IV/HD, aranesp 200 q wed /HD Venofer 0  Other HGB- 6.7<9.1<9.5<6.0 TFS 31% 09-02-14  Assessment/Plan: 1. Anemia - acute on chronic, symptoms better s/p 2 U PRBCs 10/1, Hgb up to 10 yesterday, now 8.8, Fe sat 65%, Aranesp 200 mcg on Wed.  Bone marrow biopsy today to evaluate pancytopenia. 2. COPD exacerbation - CXR with R perihilar opacity, possibly PNA, still smoking, on Vancomycin & Levaquin.  3. ESRD - HD on MWF [facility] K 3.6. HD tomorrow.  4. HTN/Volume - BP 106/44, Carvedilol 25 mg bid, Amlodipine 10 mg qhs; wt 64 kg s/p net UF 1 L yesterday.  5. Sec HPT - Ca 8.2 (9 corrected), P 3.9; Hectorol 1 mcg, no binder.  6. Nutrition - Alb 3, renal diet, multivitamin.     LOS: 5 days   LYLES,CHARLES 09/15/2014,8:07 AM  Pt seen, examined and   agree w A/P as above.  Rob  MD pager [phone]    cell [phone] 09/15/2014, 3:47 PM     

## 2014-09-15 NOTE — Progress Notes (Signed)
Patient Discharge: Disposition: Patient is discharged home. Education: Patient and family educated about follow-up appointments, discharge instructions, medications, prescriptions. IV: Removed Iv before discharge. Transportation: Patient was transported in w/c accompanied by the staff member and wife. Belongings: Patient 's wife took all his belongings with her.

## 2014-09-15 NOTE — Discharge Summary (Signed)
Physician Discharge Summary  Cameron Mendez:[id number] DOB: [birth year] DOA: 09/10/2014  PCP: [name], MD  Admit date: 09/10/2014 Discharge date: 09/15/2014  Time spent: 30 minutes  Recommendations for Outpatient Follow-up:  1. FOLLOW UP WITH hematologist and follow up there results of the bone marrow biopsy 2. Follow up with Pcp IN ONE WEEK.   Discharge Diagnoses:  Principal Problem:   Anemia Active Problems:   Essential hypertension, benign   ESRD on hemodialysis   COPD (chronic obstructive pulmonary disease)   Other pancytopenia   Diastolic dysfunction   suspected COPD with acute exacerbation   Discharge Condition: improved  Diet recommendation: low sodium diet.   Filed Weights   09/14/14 0659 09/14/14 1112 09/14/14 2147  Weight: 65.2 kg (143 lb 11.8 oz) 63.7 kg (140 lb 6.9 oz) 64 kg (141 lb 1.5 oz)    History of present illness:  Cameron Mendez is a 68 y.o. male with Past medical history of chronic diastolic dysfunction, hypertension, ESRD on hemodialysis, COPD, anemia of kidney disease, chronic thrombocytopenia.  the patient presents to ER with the complaint of abnormal lab. Patient was recently admitted for anemia, etiology of that was not identified and patient was referred to hematology for further workup as an outpatient but unfortunately the patient has not seen a hematologist.  when the patient went with hemodialysis routine CBC was performed and he was found to have hemoglobin of 6 and therefore he was referred to ER for further workup.   Hospital Course:  Pancytopenia  The patient is presenting with complaints of abnormal lab with shortness of breath and tiredness without any signs of active bleeding. He has chronically low hemoglobin as well as platelets, and has not seen any hematologist for further workup lately.Anemia panel reveals elevated ferritin and normal iron levels. With All 3 cell lines being on a lower side possibility of marrow suppression,  medication side effect, nutritional deficiency cannot be ruled out.  Pt received 2 units of prbc transfusion since admission. Spoke to Dr Julien Nordmann , recommended Bone marrow biopsy with IR, if abnormal follow up with hematologist. Pt underwent bone arrow biopsy on 10/6 and discharged to follow up with hematology to discuss the results of the bone marrow biopsy. .ESRD  Would continue hemodialysis, consulted nephrology.  Iron supplement as well as Aranesp per nephrology.  COPD with acute exacerbation / questionable pneumonia.  Patient presented with cough and shortness of breath without any fever. Chest x-ray shows increased vascular congestion and possible infiltrate.  He was treated with DuoNeb, oxygen as needed, levofloxacin.  4.Possible acute on chronic diastolic dysfunction.  Resolved. Patient on hemodialysis with ESRD .      Procedures Bone marrow biopsy 10/6 hd  Consultations:  Renal  IR  Hematology over the phone with Dr Julien Nordmann.  Discharge Exam: Filed Vitals:   09/15/14 0442  BP: 106/44  Pulse: 59  Temp: 98.2 F (36.8 C)  Resp: 17    General: alert afebrile comfortable Cardiovascular: s1s2 Respiratory: ctab  Discharge Instructions You were cared for by a hospitalist during your hospital stay. If you have any questions about your discharge medications or the care you received while you were in the hospital after you are discharged, you can call the unit and asked to speak with the hospitalist on call if the hospitalist that took care of you is not available. Once you are discharged, your primary care physician will handle any further medical issues. Please note that NO REFILLS for any discharge medications  will be authorized once you are discharged, as it is imperative that you return to your primary care physician (or establish a relationship with a primary care physician if you do not have one) for your aftercare needs so that they can reassess your need for  medications and monitor your lab values.  Discharge Instructions   Diet - low sodium heart healthy    Complete by:  As directed      Discharge instructions    Complete by:  As directed   Follow up with PCP in 2 weeks.  Follow up with Dr Julien Nordmann in 1 to 2 weeks.          Current Discharge Medication List    START taking these medications   Details  guaiFENesin (MUCINEX) 600 MG 12 hr tablet Take 1 tablet (600 mg total) by mouth 2 (two) times daily.    ipratropium-albuterol (DUONEB) 0.5-2.5 (3) MG/3ML SOLN Take 3 mLs by nebulization every 4 (four) hours as needed. Qty: 360 mL, Refills: 2    multivitamin (RENA-VIT) TABS tablet Take 1 tablet by mouth at bedtime. Qty: 30 tablet, Refills: 0      CONTINUE these medications which have NOT CHANGED   Details  acetaminophen (TYLENOL) 500 MG tablet Take 1,000 mg by mouth every 6 (six) hours as needed for moderate pain.    allopurinol (ZYLOPRIM) 100 MG tablet Take 100 mg by mouth daily.     amLODipine (NORVASC) 10 MG tablet Take 10 mg by mouth every evening.     aspirin 81 MG tablet Take 81 mg by mouth every morning.     bisacodyl (DULCOLAX) 5 MG EC tablet Take 5 mg by mouth daily as needed for moderate constipation.    carvedilol (COREG) 25 MG tablet Take 25 mg by mouth 2 (two) times daily with a meal.    clotrimazole-betamethasone (LOTRISONE) cream Apply 1 application topically 2 (two) times daily as needed (for rash).     colchicine 0.6 MG tablet Take 0.6 mg by mouth daily as needed. For gout    darbepoetin (ARANESP) 200 MCG/0.4ML SOLN injection Inject 200 mcg into the vein every 7 (seven) days. Take on Mondays    diphenhydrAMINE (BENADRYL) 25 MG tablet Take 25-50 mg by mouth every 6 (six) hours as needed for itching or allergies.     doxercalciferol (HECTOROL) 4 MCG/2ML injection Inject 0.5 mLs (1 mcg total) into the vein every Monday, Wednesday, and Friday with hemodialysis. Qty: 2 mL    ferric gluconate 125 mg in sodium  chloride 0.9 % 100 mL Inject 125 mg into the vein every Monday, Wednesday, and Friday with hemodialysis.    pantoprazole (PROTONIX) 40 MG tablet Take 40 mg by mouth daily.    Tiotropium Bromide Monohydrate (SPIRIVA RESPIMAT) 2.5 MCG/ACT AERS Inhale 2 puffs into the lungs every morning. Qty: 1 Inhaler, Refills: 6       Allergies  Allergen Reactions  . Ibuprofen Other (See Comments)    Kidney problems-dialysis patient  . Nsaids Other (See Comments)    Kidney problems-dialysis patient   Follow-up Information   Follow up with HILL,GERALD K, MD. Schedule an appointment as soon as possible for a visit in 1 week.   Specialty:  Family Medicine   Contact information:   [address] [place]  [address] [phone]       Follow up with [facility] K., MD. Schedule an appointment as soon as possible for a visit in 1 week.   Specialty:  Oncology   Contact information:   [address] [place] [address] [phone]        The results of significant diagnostics from this hospitalization (including imaging, microbiology, ancillary and laboratory) are listed below for reference.    Significant Diagnostic Studies: Dg Chest 2 View  09/14/2014   CLINICAL DATA:  Cough and shortness of breath with history of COPD, dialysis dependent renal insufficiency, and CHF.  EXAM: CHEST  2 VIEW  COMPARISON:  PA and lateral chest x-ray of September 10, 2014.  FINDINGS: The lungs are mildly hyperinflated. There is no focal infiltrate. The cardiopericardial silhouette is enlarged. The central pulmonary vascularity is mildly prominent though stable. There is no pleural effusion. A portion of the patient's ventriculoperitoneal shunt tube is visible and appears intact. There is a second tubular structure extending from the posterior aspect of the right seventh rib inferiorly which may reflect an unused shunt fragment. This is stable. The bony thorax is unremarkable.  IMPRESSION: Low-grade  compensated CHF superimposed upon COPD. There is no evidence of pneumonia nor pulmonary alveolar edema.   Electronically Signed   By: David  Martinique   On: 09/14/2014 19:34   Dg Chest 2 View  09/10/2014   CLINICAL DATA:  Shortness of breath, COPD exacerbation  EXAM: CHEST  2 VIEW  COMPARISON:  04/28/2014  FINDINGS: Cardiomegaly with pulmonary vascular congestion. Superimposed right perihilar opacity, pneumonia not excluded. No pleural effusion or pneumothorax.  Right VP shunt catheter.  IMPRESSION: Cardiomegaly with pulmonary vascular congestion.  Superimposed right perihilar opacity, pneumonia not excluded.   Electronically Signed   By: Julian Hy M.D.   On: 09/10/2014 21:00    Microbiology: Recent Results (from the past 240 hour(s))  MRSA PCR SCREENING     Status: None   Collection Time    09/10/14 10:58 PM      Result Value Ref Range Status   MRSA by PCR NEGATIVE  NEGATIVE Final   Comment:            The GeneXpert MRSA Assay (FDA     approved for NASAL specimens     only), is one component of a     comprehensive MRSA colonization     surveillance program. It is not     intended to diagnose MRSA     infection nor to guide or     monitor treatment for     MRSA infections.     Labs: Basic Metabolic Panel:  Recent Labs Lab 09/10/14 1721 09/11/14 0717 09/12/14 1351 09/14/14 0717  NA 145 136* 133* 132*  K 3.8 3.4* 3.5* 3.6*  CL 98 94* 95* 94*  CO2 33* 28 25 24   GLUCOSE 88 82 83 84  BUN 23 29* 20 42*  CREATININE 4.61* 5.04* 3.87* 6.39*  CALCIUM 8.6 7.9* 8.5 8.2*  MG  --   --  1.8  --   PHOS  --   --   --  3.9   Liver Function Tests:  Recent Labs Lab 09/10/14 1721 09/14/14 0717  AST 13  --   ALT 6  --   ALKPHOS 67  --   BILITOT 0.6  --   PROT 7.6  --   ALBUMIN 3.4* 3.0*   No results found for this basename: LIPASE, AMYLASE,  in the last 168 hours No results found for this basename: AMMONIA,  in the last 168 hours CBC:  Recent Labs Lab 09/10/14 1721  09/11/14 0717 09/12/14 1024 09/14/14 0500 09/14/14  1350 09/15/14 0430  WBC 2.7* 3.6* 2.7* 7.3 2.3* 2.8*  NEUTROABS 1.8 2.7  --   --  1.7  --   HGB 6.7* 7.8* 8.4* 9.8* 10.0* 8.8*  HCT 22.1* 24.3* 26.0* 29.2* 31.0* 28.0*  MCV 93.6 90.0 88.4 83.4 88.1 90.9  PLT 65* 65* 52* 121* 61* 57*   Cardiac Enzymes: No results found for this basename: CKTOTAL, CKMB, CKMBINDEX, TROPONINI,  in the last 168 hours BNP: BNP (last 3 results)  Recent Labs  09/17/13 0425 09/17/13 0747 04/27/14 0600  PROBNP 7483.0* 8483.0* 8130.0*   CBG:  Recent Labs Lab 09/14/14 2129  GLUCAP 104*       Signed:  Newt Levingston  [facility] 09/15/2014, 3:18 PM

## 2014-09-15 NOTE — Procedures (Signed)
Interventional Radiology Procedure Note  Procedure: CT guided aspirate and core biopsy of right iliac bone Complications: None Recommendations: - Bedrest supine x 2 hrs - Hydrocodone PRN  Pain - Follow biopsy results  Signed,  Olivier Frayre K. Damien Batty, MD   

## 2014-09-23 LAB — CHROMOSOME ANALYSIS, BONE MARROW

## 2014-11-19 ENCOUNTER — Encounter (HOSPITAL_COMMUNITY): Payer: Self-pay | Admitting: Cardiovascular Disease

## 2014-12-15 ENCOUNTER — Encounter: Payer: Self-pay | Admitting: Emergency Medicine

## 2014-12-15 ENCOUNTER — Ambulatory Visit (INDEPENDENT_AMBULATORY_CARE_PROVIDER_SITE_OTHER): Payer: Medicare Other | Admitting: Emergency Medicine

## 2014-12-15 VITALS — BP 140/62 | HR 74 | Temp 98.3°F | Ht 63.0 in | Wt 141.0 lb

## 2014-12-15 DIAGNOSIS — Z23 Encounter for immunization: Secondary | ICD-10-CM

## 2014-12-15 DIAGNOSIS — J449 Chronic obstructive pulmonary disease, unspecified: Secondary | ICD-10-CM

## 2014-12-15 NOTE — Addendum Note (Signed)
Addended by: Desmond Dike C on: 12/15/2014 03:00 PM   Modules accepted: Orders

## 2014-12-15 NOTE — Assessment & Plan Note (Addendum)
Overall stable. He seems to be benefiting from Spiriva. - he considers stopping smoking but not ready to make a plan at this time   Please continue your Spiriva every day Use albuterol 2 puffs as needed for short windedness Please continue to work on decreasing your smoking We will give you the Prevnar 13 vaccination today.  Follow with Dr Lamonte Sakai in 4 months or sooner if you have any problems.

## 2014-12-15 NOTE — Patient Instructions (Signed)
Please continue your Spiriva every day Use albuterol 2 puffs as needed for short windedness Please continue to work on decreasing your smoking We will give you the Prevnar 13 vaccination today.  Follow with Dr Lamonte Sakai in 4 months or sooner if you have any problems.

## 2014-12-15 NOTE — Progress Notes (Signed)
   Subjective:    Patient ID: Cameron Mendez, male    DOB: 09/30/1946, 69 y.o.   MRN: [id number]  HPI 69 yo smoker (30pk-yr, 0.5pk/day), hx of HTN, diastolic dysfxn, chronic renal disease on HD. He was admitted to [facility] 5/18-5/22 with resp failure felt to be volume overload + possible COPD exacerbation. At baseline he does not cough much, he does hear some wheezing.   ROV 06/18/14 -- follows up for dyspnea, hx recent admission for resp failure, suspected COPD. He underwent PFT 06/04/14 > mixed disease, decreased DLCO that corrects for [facility]. Returns now to review. He is not using albuterol frequently. Not very active but also not SOB. Occasional cough.   ROV 08/04/14 -- follow up for probable COPD; Also w hx  HTN, diastolic dysfxn, chronic renal disease on HD. Last time we started Spiriva > he feels that it has helped his breathing. He hasn't had any apparent side effects. He is tolerating HD on MWF.   ROV 12/15/14 -- pleasant 69 year old gentleman who follows up for COPD. He has been using Spiriva via respimat. He has been doing well since last time. He does still have some periods of dyspnea at rest or with exertion. He uses SABA 1-2x a week. He continues to smoke 0.5 pk/day, doesn't really want to stop. Discussed prevnar 13.    Review of Systems  Constitutional: Negative for fever and unexpected weight change.  HENT: Negative for congestion, dental problem, ear pain, nosebleeds, postnasal drip, rhinorrhea, sinus pressure, sneezing, sore throat and trouble swallowing.   Eyes: Negative for redness and itching.  Respiratory: Positive for shortness of breath. Negative for cough, chest tightness and wheezing.   Cardiovascular: Negative for palpitations and leg swelling.  Gastrointestinal: Negative for nausea and vomiting.  Genitourinary: Negative for dysuria.  Musculoskeletal: Negative for joint swelling.  Skin: Negative for rash.  Neurological: Negative for headaches.  Hematological: Does not  bruise/bleed easily.  Psychiatric/Behavioral: Negative for dysphoric mood. The patient is not nervous/anxious.        Objective:   Physical Exam Filed Vitals:   12/15/14 1436 12/15/14 1437  BP:  140/62  Pulse:  74  Temp: 98.3 F (36.8 C)   TempSrc: Oral   Height: 5\' 3"  (1.6 m)   Weight: 141 lb (63.957 kg)   SpO2:  94%   Gen: Pleasant, well-nourished, in no distress,  normal affect  ENT: No lesions,  mouth clear, blind in both eyes,   Neck: No JVD, no TMG, no carotid bruits  Lungs: No use of accessory muscles, no wheeze, few scattered basilar crackles.   Cardiovascular: RRR, heart sounds normal, no murmur or gallops, trace peripheral edema  Musculoskeletal: No deformities, no cyanosis or clubbing  Neuro: alert, non focal  Skin: Warm, no lesions or rashes     Assessment & Plan:  COPD (chronic obstructive pulmonary disease) Overall stable. He seems to be benefiting from Spiriva. - he considers stopping smoking but not ready to make a plan at this time   Please continue your Spiriva every day Use albuterol 2 puffs as needed for short windedness Please continue to work on decreasing your smoking We will give you the Prevnar 13 vaccination today.  Follow with Dr Lamonte Sakai in 4 months or sooner if you have any problems.

## 2014-12-30 ENCOUNTER — Encounter: Payer: Self-pay | Admitting: Gastroenterology

## 2014-12-31 ENCOUNTER — Ambulatory Visit (INDEPENDENT_AMBULATORY_CARE_PROVIDER_SITE_OTHER): Payer: Medicare Other | Admitting: Nurse Practitioner

## 2014-12-31 ENCOUNTER — Encounter: Payer: Self-pay | Admitting: Nurse Practitioner

## 2014-12-31 VITALS — BP 162/50 | HR 80 | Ht 63.0 in | Wt 136.4 lb

## 2014-12-31 DIAGNOSIS — D631 Anemia in chronic kidney disease: Secondary | ICD-10-CM

## 2014-12-31 DIAGNOSIS — N189 Chronic kidney disease, unspecified: Secondary | ICD-10-CM

## 2014-12-31 DIAGNOSIS — Z1211 Encounter for screening for malignant neoplasm of colon: Secondary | ICD-10-CM

## 2014-12-31 DIAGNOSIS — Z86010 Personal history of colonic polyps: Secondary | ICD-10-CM

## 2014-12-31 DIAGNOSIS — R195 Other fecal abnormalities: Secondary | ICD-10-CM

## 2014-12-31 MED ORDER — MOVIPREP 100 G PO SOLR: 1.0000 | ORAL | Status: DC

## 2014-12-31 NOTE — Progress Notes (Addendum)
     History of Present Illness:  Patient is a 69 year old male with multiple medical problems including, but not limited to ESRD on hemodialysis, COPD, hypertension, chronic diastolic dysfunction and blindness. Patient is referred by nephrology for progressive anemia and hemoccult positive stools. He has chronic anemia but recently he has been requiring blood transfusions.  In mid September patient  transfused a unit of blood for hgb of 6.2. Hgb drifted back down to 6.5 post-transfusion and another two units of blood were given. A post-transfusion hgb wasn't checked.    Patient was readmitted early October with symptomatic anemia (hgb 6.7). His ferritin was high at 1500, TIBC low.  Given 2 units of blood, hgb was 8.8 at time of discharge on 09/15/14. During that admission patient underwent bone marrow biopsy for pancytopenia. Bone marrow biopsy revealed hypercellular bone marrow for age with trilineage hematopoiesis. He hasn't seen Hematology outpatient. In   Patient's hgb has drifted again, on 12/23/14 it is down to 7.4. Patient blind, wouldn't know if any overt GI bleeding. No abdominal pain. No nausea. No unusual weight loss. No blood thinners.   Current Medications, Allergies, Past Medical History, Past Surgical History, Family History and Social History were reviewed in [employer] record.  Physical Exam: General: Pleasant, well developed , black male in no acute distress. Walks with a cane. Head: Normocephalic and atraumatic Eyes:  sclerae anicteric, conjunctiva pink  Ears: Normal auditory acuity Lungs: Clear throughout to auscultation Heart: Regular rate and rhythm Abdomen: Soft, non distended, non-tender. No masses, no hepatomegaly. Normal bowel sounds Musculoskeletal: Symmetrical with no gross deformities  Extremities: No edema  Neurological: Alert oriented x 4, grossly nonfocal Psychological:  Alert and cooperative. Normal mood and affect  Assessment and  Recommendations:  Anemia 69 year old male referred for heme positive stool and a recent progression of his chronic normocytic anemia, Hemoglobin 12/23/14 was 7.4. No evidence for iron deficiency.   Patient is due for colonoscopy for polyp surveillance (adenomatous polyps 2013), he may need upper endoscopy as well (awaiting records from [place]).  Will schedule EGD and colonoscopy pending record review.  The benefits, risks, and potential complications of EGD and colonoscopy with possible biopsies were discussed with the patient and he agrees to proceed.    Procedures scheduled at [facility]. Despite multiple medical problems patient looks okay and his EFis 60%.    Addendum 01/12/15: Records received from [facility]. Patient had a colonoscopy 02/02/09 for polyp surveillance. Extent of the exam was to the cecum, bowel prep was fair. Findings included a sessile polyp in the sigmoid colon (5 mm), 2 sessile descending colon polyps (5 mm). Benign-appearing rectal polyp found (5 mm). Pathology: sigmoid and descending colon polyps were adenomatous. Rectal polyp hyperplastic.  Given progressive anemia will proceed with EGD /colonoscopy as scheduled.

## 2014-12-31 NOTE — Patient Instructions (Addendum)
You have been scheduled for an endoscopy and colonoscopy. Please follow the written instructions given to you at your visit today. Please pick up your prep at the pharmacy within the next 1-3 days. [facility] [address], [place], [place]. If you use inhalers (even only as needed), please bring them with you on the day of your procedure.  If you decide to stay with Dr. Laurence Spates, we made you an appointment with him for 01-07-2015 THursday at 9:15 arrive at 9:00 am . Their number is [phone]. PLease call us and we will cancel the procedures we scheduled for you for 02-16-2015.

## 2014-12-31 NOTE — Assessment & Plan Note (Addendum)
Chronic normocytic anemia. Nephrology referred to [place] for progressive anemia. Most recent hemoglobin 12/23/14 was 7.4. Patient Hemoccult-positive per nephrology office. Patient is due for colonoscopy for polyp surveillance, he may need upper endoscopy as well. Awaiting records from 2013 procedures by [facility]. Will likely need EGD which can be done at time of colonoscopy. The benefits, risks, and potential complications of EGD with possible biopsies were discussed with the patient and he agrees to proceed.

## 2015-01-04 NOTE — Progress Notes (Signed)
Reviewed and agree with management. Tessy Pawelski D. Jerrie Gullo, M.D., FACG  

## 2015-01-08 ENCOUNTER — Observation Stay (HOSPITAL_COMMUNITY): Payer: Medicare Other

## 2015-01-08 ENCOUNTER — Encounter (HOSPITAL_COMMUNITY): Payer: Self-pay

## 2015-01-08 ENCOUNTER — Inpatient Hospital Stay (HOSPITAL_COMMUNITY)
Admission: EM | Admit: 2015-01-08 | Discharge: 2015-01-14 | DRG: 377 | Disposition: A | Discharge status: Home / Self Care | Payer: Medicare Other | Attending: Internal Medicine | Admitting: Internal Medicine

## 2015-01-08 DIAGNOSIS — Z9889 Other specified postprocedural states: Secondary | ICD-10-CM

## 2015-01-08 DIAGNOSIS — Z992 Dependence on renal dialysis: Secondary | ICD-10-CM

## 2015-01-08 DIAGNOSIS — R0602 Shortness of breath: Secondary | ICD-10-CM

## 2015-01-08 DIAGNOSIS — F17210 Nicotine dependence, cigarettes, uncomplicated: Secondary | ICD-10-CM | POA: Diagnosis present

## 2015-01-08 DIAGNOSIS — D61818 Other pancytopenia: Secondary | ICD-10-CM

## 2015-01-08 DIAGNOSIS — Z9841 Cataract extraction status, right eye: Secondary | ICD-10-CM

## 2015-01-08 DIAGNOSIS — Z89022 Acquired absence of left finger(s): Secondary | ICD-10-CM

## 2015-01-08 DIAGNOSIS — J449 Chronic obstructive pulmonary disease, unspecified: Secondary | ICD-10-CM

## 2015-01-08 DIAGNOSIS — K922 Gastrointestinal hemorrhage, unspecified: Principal | ICD-10-CM

## 2015-01-08 DIAGNOSIS — M1990 Unspecified osteoarthritis, unspecified site: Secondary | ICD-10-CM | POA: Diagnosis present

## 2015-01-08 DIAGNOSIS — D500 Iron deficiency anemia secondary to blood loss (chronic): Secondary | ICD-10-CM | POA: Diagnosis not present

## 2015-01-08 DIAGNOSIS — I10 Essential (primary) hypertension: Secondary | ICD-10-CM

## 2015-01-08 DIAGNOSIS — I120 Hypertensive chronic kidney disease with stage 5 chronic kidney disease or end stage renal disease: Secondary | ICD-10-CM | POA: Diagnosis present

## 2015-01-08 DIAGNOSIS — Z7982 Long term (current) use of aspirin: Secondary | ICD-10-CM

## 2015-01-08 DIAGNOSIS — Z961 Presence of intraocular lens: Secondary | ICD-10-CM | POA: Diagnosis present

## 2015-01-08 DIAGNOSIS — Z86010 Personal history of colonic polyps: Secondary | ICD-10-CM

## 2015-01-08 DIAGNOSIS — J441 Chronic obstructive pulmonary disease with (acute) exacerbation: Secondary | ICD-10-CM | POA: Diagnosis present

## 2015-01-08 DIAGNOSIS — M109 Gout, unspecified: Secondary | ICD-10-CM | POA: Diagnosis present

## 2015-01-08 DIAGNOSIS — J90 Pleural effusion, not elsewhere classified: Secondary | ICD-10-CM | POA: Diagnosis present

## 2015-01-08 DIAGNOSIS — Z720 Tobacco use: Secondary | ICD-10-CM

## 2015-01-08 DIAGNOSIS — Z9842 Cataract extraction status, left eye: Secondary | ICD-10-CM

## 2015-01-08 DIAGNOSIS — N186 End stage renal disease: Secondary | ICD-10-CM

## 2015-01-08 DIAGNOSIS — N2581 Secondary hyperparathyroidism of renal origin: Secondary | ICD-10-CM | POA: Diagnosis present

## 2015-01-08 DIAGNOSIS — H548 Legal blindness, as defined in USA: Secondary | ICD-10-CM | POA: Diagnosis present

## 2015-01-08 DIAGNOSIS — Z888 Allergy status to other drugs, medicaments and biological substances status: Secondary | ICD-10-CM

## 2015-01-08 DIAGNOSIS — N400 Enlarged prostate without lower urinary tract symptoms: Secondary | ICD-10-CM | POA: Diagnosis present

## 2015-01-08 DIAGNOSIS — I5032 Chronic diastolic (congestive) heart failure: Secondary | ICD-10-CM | POA: Diagnosis present

## 2015-01-08 LAB — PREPARE RBC (CROSSMATCH)

## 2015-01-08 LAB — COMPREHENSIVE METABOLIC PANEL
ALT: 11 U/L (ref 0–53)
ANION GAP: 11 (ref 5–15)
AST: 28 U/L (ref 0–37)
Albumin: 3.1 g/dL — ABNORMAL LOW (ref 3.5–5.2)
Alkaline Phosphatase: 69 U/L (ref 39–117)
BUN: <5 mg/dL — ABNORMAL LOW (ref 6–23)
CALCIUM: 8.6 mg/dL (ref 8.4–10.5)
CO2: 33 mmol/L — ABNORMAL HIGH (ref 19–32)
Chloride: 95 mmol/L — ABNORMAL LOW (ref 96–112)
Creatinine, Ser: 2.32 mg/dL — ABNORMAL HIGH (ref 0.50–1.35)
GFR calc Af Amer: 32 mL/min — ABNORMAL LOW (ref >90)
GFR calc non Af Amer: 27 mL/min — ABNORMAL LOW (ref >90)
GLUCOSE: 106 mg/dL — AB (ref 70–99)
Potassium: 3.2 mmol/L — ABNORMAL LOW (ref 3.5–5.1)
SODIUM: 139 mmol/L (ref 135–145)
Total Bilirubin: 0.6 mg/dL (ref 0.3–1.2)
Total Protein: 8.3 g/dL (ref 6.0–8.3)

## 2015-01-08 LAB — CBC WITH DIFFERENTIAL/PLATELET
BASOS ABS: 0 10*3/uL (ref 0.0–0.1)
BASOS PCT: 0 % (ref 0–1)
Eosinophils Absolute: 0 10*3/uL (ref 0.0–0.7)
Eosinophils Relative: 0 % (ref 0–5)
HEMATOCRIT: 22 % — AB (ref 39.0–52.0)
Hemoglobin: 6.7 g/dL — CL (ref 13.0–17.0)
Lymphocytes Relative: 16 % (ref 12–46)
Lymphs Abs: 0.6 10*3/uL — ABNORMAL LOW (ref 0.7–4.0)
MCH: 28.2 pg (ref 26.0–34.0)
MCHC: 30.5 g/dL (ref 30.0–36.0)
MCV: 92.4 fL (ref 78.0–100.0)
Monocytes Absolute: 0.3 10*3/uL (ref 0.1–1.0)
Monocytes Relative: 8 % (ref 3–12)
Neutro Abs: 2.9 10*3/uL (ref 1.7–7.7)
Neutrophils Relative %: 75 % (ref 43–77)
Platelets: 114 10*3/uL — ABNORMAL LOW (ref 150–400)
RBC: 2.38 MIL/uL — ABNORMAL LOW (ref 4.22–5.81)
RDW: 15.7 % — AB (ref 11.5–15.5)
WBC: 3.8 10*3/uL — ABNORMAL LOW (ref 4.0–10.5)

## 2015-01-08 LAB — BASIC METABOLIC PANEL
Anion gap: 6 (ref 5–15)
BUN: <5 mg/dL — ABNORMAL LOW (ref 6–23)
CHLORIDE: 96 mmol/L (ref 96–112)
CO2: 36 mmol/L — ABNORMAL HIGH (ref 19–32)
CREATININE: 1.9 mg/dL — AB (ref 0.50–1.35)
Calcium: 8.2 mg/dL — ABNORMAL LOW (ref 8.4–10.5)
GFR calc Af Amer: 40 mL/min — ABNORMAL LOW (ref >90)
GFR calc non Af Amer: 35 mL/min — ABNORMAL LOW (ref >90)
Glucose, Bld: 89 mg/dL (ref 70–99)
POTASSIUM: 3.1 mmol/L — AB (ref 3.5–5.1)
Sodium: 138 mmol/L (ref 135–145)

## 2015-01-08 LAB — RETICULOCYTES
RBC.: 2.55 MIL/uL — ABNORMAL LOW (ref 4.22–5.81)
RETIC COUNT ABSOLUTE: 132.6 10*3/uL (ref 19.0–186.0)
Retic Ct Pct: 5.2 % — ABNORMAL HIGH (ref 0.4–3.1)

## 2015-01-08 LAB — POC OCCULT BLOOD, ED: Fecal Occult Bld: POSITIVE — AB

## 2015-01-08 LAB — SAMPLE TO BLOOD BANK

## 2015-01-08 LAB — HEMOGLOBIN AND HEMATOCRIT, BLOOD
HCT: 23.8 % — ABNORMAL LOW (ref 39.0–52.0)
HEMOGLOBIN: 7.2 g/dL — AB (ref 13.0–17.0)

## 2015-01-08 MED ORDER — SODIUM CHLORIDE 0.9 % IJ SOLN: 3.0000 mL | INTRAMUSCULAR | Status: DC | PRN

## 2015-01-08 MED ORDER — SODIUM CHLORIDE 0.9 % IV SOLN: 250.0000 mL | INTRAVENOUS | Status: DC | PRN

## 2015-01-08 MED ORDER — ACETAMINOPHEN 650 MG RE SUPP: 650.0000 mg | Freq: Four times a day (QID) | RECTAL | Status: DC | PRN

## 2015-01-08 MED ORDER — IPRATROPIUM-ALBUTEROL 0.5-2.5 (3) MG/3ML IN SOLN
3.0000 mL | RESPIRATORY_TRACT | Status: DC | PRN
  Administered 2015-01-09 – 2015-01-11 (×3): 3 mL via RESPIRATORY_TRACT
  Filled 2015-01-08 (×3): qty 3

## 2015-01-08 MED ORDER — ACETAMINOPHEN 325 MG PO TABS: 650.0000 mg | ORAL_TABLET | Freq: Four times a day (QID) | ORAL | Status: DC | PRN

## 2015-01-08 MED ORDER — OXYCODONE HCL 5 MG PO TABS: 5.0000 mg | ORAL_TABLET | ORAL | Status: DC | PRN

## 2015-01-08 MED ORDER — SODIUM CHLORIDE 0.9 % IJ SOLN
3.0000 mL | Freq: Two times a day (BID) | INTRAMUSCULAR | Status: DC
  Administered 2015-01-09 – 2015-01-13 (×9): 3 mL via INTRAVENOUS

## 2015-01-08 MED ORDER — IPRATROPIUM BROMIDE 0.02 % IN SOLN
0.5000 mg | Freq: Once | RESPIRATORY_TRACT | Status: AC
  Administered 2015-01-08: 0.5 mg via RESPIRATORY_TRACT
  Filled 2015-01-08: qty 2.5

## 2015-01-08 MED ORDER — PANTOPRAZOLE SODIUM 40 MG IV SOLR
40.0000 mg | Freq: Two times a day (BID) | INTRAVENOUS | Status: DC
  Administered 2015-01-09 – 2015-01-13 (×11): 40 mg via INTRAVENOUS
  Filled 2015-01-08 (×15): qty 40

## 2015-01-08 MED ORDER — ALLOPURINOL 100 MG PO TABS
100.0000 mg | ORAL_TABLET | Freq: Every day | ORAL | Status: DC
  Administered 2015-01-09 – 2015-01-14 (×7): 100 mg via ORAL
  Filled 2015-01-08 (×7): qty 1

## 2015-01-08 MED ORDER — HYDROMORPHONE HCL 1 MG/ML IJ SOLN
0.5000 mg | INTRAMUSCULAR | Status: DC | PRN
  Filled 2015-01-08: qty 1

## 2015-01-08 MED ORDER — ALBUTEROL SULFATE (2.5 MG/3ML) 0.083% IN NEBU
5.0000 mg | INHALATION_SOLUTION | Freq: Once | RESPIRATORY_TRACT | Status: AC
  Administered 2015-01-08: 5 mg via RESPIRATORY_TRACT
  Filled 2015-01-08: qty 6

## 2015-01-08 MED ORDER — ONDANSETRON HCL 4 MG PO TABS: 4.0000 mg | ORAL_TABLET | Freq: Four times a day (QID) | ORAL | Status: DC | PRN

## 2015-01-08 MED ORDER — ONDANSETRON HCL 4 MG/2ML IJ SOLN: 4.0000 mg | Freq: Four times a day (QID) | INTRAMUSCULAR | Status: DC | PRN

## 2015-01-08 MED ORDER — SODIUM CHLORIDE 0.9 % IJ SOLN
3.0000 mL | Freq: Two times a day (BID) | INTRAMUSCULAR | Status: DC
  Administered 2015-01-11 – 2015-01-13 (×3): 3 mL via INTRAVENOUS

## 2015-01-08 NOTE — ED Notes (Signed)
Per ems, pt sent from dialysis due to low hemoglobin, vss, pt in no acute distress.

## 2015-01-08 NOTE — ED Notes (Signed)
Patient transported to X-ray 

## 2015-01-08 NOTE — ED Notes (Signed)
Blood bank called and stated that 2 units were ready, pt is in x-ray and going upstairs, report from blood bank communicated upstairs to 5W RN

## 2015-01-08 NOTE — ED Provider Notes (Signed)
CSN: [id number]     Arrival date & time 01/08/15  1715 History   First MD Initiated Contact with Patient 01/08/15 1725     Chief Complaint  Patient presents with  . Abnormal Lab     (Consider location/radiation/quality/duration/timing/severity/associated sxs/prior Treatment) HPI   Cameron Mendez is a 69 y.o. male who was sent here, from his dialysis treatment today, for a reported low hemoglobin.  The paperwork with him indicates that he had a hemoglobin done on 01/06/15 that was 7.8.  This is in the range of usual, for him.  He has had some GI bleeding, and is scheduled to have an upper and lower endoscopy by his GI physician.  This is scheduled for 02/16/2015. He has had mild cough and weakness recently.  He had a transfusion a couple months ago.  He is hungry now, and has no other symptoms.  There are no other no modifying factors.   Past Medical History  Diagnosis Date  . Anemia   . Diastolic dysfunction   . Retinitis pigmentosa     Blindness--has shunt --placed yrs ago in [place]..  . Arthritis     Gout  . Hypertension     dx--"long time"  . CKD (chronic kidney disease), stage IV     a. L upper extremity AV fistula created 07/2012.  [name] BPH (benign prostatic hyperplasia)   . Blind   . Gangrene of finger     a. 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  . Colon polyps   . Pericardial effusion     a. Noted on echo 10/2009. b. Again seen on echo 09/2013.  [name] CHF (congestive heart failure)   . COPD (chronic obstructive pulmonary disease)    Past Surgical History  Procedure Laterality Date  . Cataract extraction w/ intraocular lens  implant, bilateral Bilateral   . Knee ligament reconstruction Left   . Av fistula placement  07/15/2012    Procedure: ARTERIOVENOUS (AV) FISTULA CREATION;  Surgeon: Rosetta Posner, MD;  Location: [facility];  Service: Vascular;  Laterality: Left;  . Amputation  12/30/2012    Procedure: AMPUTATION DIGIT;  Surgeon: Tennis Must, MD;  Location: [facility];  Service: Orthopedics;  Laterality: Left;  LEFT SMALL FINGER AMPUTATION  . Pericardiocentesis  09/2013  . Pericardial tap N/A 09/19/2013    Procedure: PERICARDIAL TAP;  Surgeon: Blane Ohara, MD;  Location: [facility] C[facility];  Service: Cardiovascular;  Laterality: N/A;   Family History  Problem Relation Age of Onset  . Ovarian cancer Mother   . Colon cancer Neg Hx   . Colon polyps Brother   . Kidney disease Neg Hx   . Gallbladder disease Neg Hx   . Esophageal cancer Neg Hx   . Heart disease Neg Hx    History  Substance Use Topics  . Smoking status: Current Every Day Smoker -- 0.50 packs/day for 50 years    Types: Cigarettes  . Smokeless tobacco: Never Used     Comment: Pt given handout on how to quit smoking 12/31/14  . Alcohol Use: No     Comment: "quit driinking in ~ 2000"    Review of Systems  All other systems reviewed and are negative.     Allergies  Ibuprofen and Nsaids  Home Medications   Prior to Admission medications   Medication Sig Start Date End Date Taking? Authorizing Provider  acetaminophen (TYLENOL) 500 MG tablet Take 1,000 mg by mouth every 6 (six) hours  as needed for moderate pain.    Historical Provider, MD  allopurinol (ZYLOPRIM) 100 MG tablet Take 100 mg by mouth daily.     Historical Provider, MD  amLODipine (NORVASC) 10 MG tablet Take 10 mg by mouth every evening.     Historical Provider, MD  aspirin 81 MG tablet Take 81 mg by mouth every morning.     Historical Provider, MD  bisacodyl (DULCOLAX) 5 MG EC tablet Take 5 mg by mouth daily as needed for moderate constipation.    Historical Provider, MD  carvedilol (COREG) 25 MG tablet Take 25 mg by mouth 2 (two) times daily with a meal.    Historical Provider, MD  clotrimazole-betamethasone (LOTRISONE) cream Apply 1 application topically 2 (two) times daily as needed (for rash).     Historical Provider, MD  colchicine 0.6 MG tablet Take 0.6 mg by mouth daily as needed. For gout     Historical Provider, MD  darbepoetin (ARANESP) 200 MCG/0.4ML SOLN injection Inject 200 mcg into the vein every 7 (seven) days. Take on Mondays 08/24/14   [name], DO  diphenhydrAMINE (BENADRYL) 25 MG tablet Take 25-50 mg by mouth every 6 (six) hours as needed for itching or allergies.     Historical Provider, MD  doxercalciferol (HECTOROL) 4 MCG/2ML injection Inject 0.5 mLs (1 mcg total) into the vein every Monday, Wednesday, and Friday with hemodialysis. 08/24/14   [name], DO  ferric gluconate 125 mg in sodium chloride 0.9 % 100 mL Inject 125 mg into the vein every Monday, Wednesday, and Friday with hemodialysis. 08/24/14   [name], DO  guaiFENesin (MUCINEX) 600 MG 12 hr tablet Take 1 tablet (600 mg total) by mouth 2 (two) times daily. 09/15/14   [name], MD  ipratropium-albuterol (DUONEB) 0.5-2.5 (3) MG/3ML SOLN Take 3 mLs by nebulization every 4 (four) hours as needed. 09/15/14   [name], MD  MOVIPREP 100 G SOLR Take 1 kit (200 g total) by mouth as directed. 12/31/14   [name], MD  multivitamin (RENA-VIT) TABS tablet Take 1 tablet by mouth at bedtime. 09/15/14   [name], MD  pantoprazole (PROTONIX) 40 MG tablet Take 40 mg by mouth daily. 08/24/14   [name], DO  Tiotropium Bromide Monohydrate (SPIRIVA RESPIMAT) 2.5 MCG/ACT AERS Inhale 2 puffs into the lungs every morning. 08/04/14   [name], MD   BP 126/66 mmHg  Pulse 101  Temp(Src) 97.5 F (36.4 C) (Oral)  Resp 24  SpO2 98% Physical Exam  Constitutional: He is oriented to person, place, and time. He appears well-developed and well-nourished.  HENT:  Head: Normocephalic and atraumatic.  Right Ear: External ear normal.  Left Ear: External ear normal.  Eyes: Conjunctivae and EOM are normal. Pupils are equal, round, and reactive to light.  Neck: Normal range of motion and phonation normal. Neck supple.  Cardiovascular: Normal rate, regular rhythm and normal heart sounds.   Pulmonary/Chest:  Effort normal. He exhibits no bony tenderness.  Few scattered wheezes with somewhat decreased air movement bilaterally.  Abdominal: Soft. There is no tenderness.  Genitourinary:  Normal penis.  The stool in rectal vault.  No rectal mass.  Prostate normal size without nodularity.  Musculoskeletal: Normal range of motion.  Neurological: He is alert and oriented to person, place, and time. No cranial nerve deficit or sensory deficit. He exhibits normal muscle tone. Coordination normal.  Skin: Skin is warm, dry and intact.  Psychiatric: He has a normal mood  and affect. His behavior is normal. Judgment and thought content normal.  Nursing note and vitals reviewed.   ED Course  Procedures (including critical care time)  Medications  ipratropium (ATROVENT) nebulizer solution 0.5 mg (0.5 mg Nebulization Given 01/08/15 1943)  albuterol (PROVENTIL) (2.5 MG/3ML) 0.083% nebulizer solution 5 mg (5 mg Nebulization Given 01/08/15 1943)    Patient Vitals for the past 24 hrs:  BP Temp Temp src Pulse Resp SpO2  01/08/15 1915 126/66 mmHg - - 101 24 98 %  01/08/15 1900 161/69 mmHg - - 102 19 100 %  01/08/15 1845 175/78 mmHg - - 101 19 97 %  01/08/15 1830 159/64 mmHg - - 102 14 95 %  01/08/15 1815 145/67 mmHg - - 99 16 98 %  01/08/15 1800 169/66 mmHg - - 93 17 97 %  01/08/15 1745 159/55 mmHg - - 97 17 97 %  01/08/15 1730 144/66 mmHg - - 108 16 97 %  01/08/15 1728 150/57 mmHg 97.5 F (36.4 C) Oral 101 22 99 %    7:37 PM Reevaluation with update and discussion. After initial assessment and treatment, an updated evaluation reveals he remains comfortable. Mahogony Gilchrest L    7:41 PM-Consult complete with Dr. Arnoldo Morale. Patient case explained and discussed. She agrees to admit patient for further evaluation and treatment. Call ended at 19:55    Labs Review Labs Reviewed  BASIC METABOLIC PANEL - Abnormal; Notable for the following:    Potassium 3.1 (*)    CO2 36 (*)    BUN <5 (*)    Creatinine, Ser  1.90 (*)    Calcium 8.2 (*)    GFR calc non Af Amer 35 (*)    GFR calc Af Amer 40 (*)    All other components within normal limits  CBC WITH DIFFERENTIAL/PLATELET - Abnormal; Notable for the following:    WBC 3.8 (*)    RBC 2.38 (*)    Hemoglobin 6.7 (*)    HCT 22.0 (*)    RDW 15.7 (*)    Platelets 114 (*)    Lymphs Abs 0.6 (*)    All other components within normal limits  POC OCCULT BLOOD, ED - Abnormal; Notable for the following:    Fecal Occult Bld POSITIVE (*)    All other components within normal limits  SAMPLE TO BLOOD BANK  TYPE AND SCREEN  PREPARE RBC (CROSSMATCH)    Imaging Review No results found.   EKG Interpretation None      MDM   Final diagnoses:  Gastrointestinal bleeding, upper  Blood loss anemia    Recurrent anemia secondary to blood loss from gastrointestinal tract. He will require transfusion, pending the upcoming endoscopies.    Cameron Blade, MD 01/08/15 [phone]

## 2015-01-08 NOTE — ED Notes (Signed)
hospitalist at the bedside 

## 2015-01-08 NOTE — ED Notes (Signed)
Pt transported to x-ray, then pt being transported upstairs to 5W

## 2015-01-08 NOTE — H&P (Signed)
[facility] Admission History and Physical       Cameron Mendez VZM:[id number] DOB: 1946/11/16 DOA: 01/08/2015  Referring physician: EDP PCP: [name], MD  Specialists:   Chief Complaint: Low Hemoglobin at Dialysis  HPI: Cameron Mendez is a 69 y.o. male with a history of ESRD on HD MWF, Pancytopenia, HTN, Diastolic CHF, and COPD who was sent from dialysis to the ED after his hemoglobin was found to be low.  In the ED on recheck his hemoglobin was found to be 6.7.  He denies any ABD Pain or nausea or vomiting.  His wife reports that he has had dark stools for several weeks, and she took a hemoccult to  His PCP and 2 of 3 cards were positive and he was referred to [name] Dr [name].  He was seen in the [place] [facility] office on 12/31/2014 and is scheduled for an outpatient Endoscopy on February 16, 2015.      Review of Systems:  Constitutional: No Weight Loss, No Weight Gain, Night Sweats, Fevers, Chills, Dizziness, Fatigue, or Generalized Weakness HEENT: No Headaches, Difficulty Swallowing,Tooth/Dental Problems,Sore Throat,  No Sneezing, Rhinitis, Ear Ache, Nasal Congestion, or Post Nasal Drip,  Cardio-vascular:  No Chest pain, Orthopnea, PND, Edema in Lower Extremities, Anasarca, Dizziness, Palpitations  Resp: No Dyspnea, No DOE, No Productive Cough, No Non-Productive Cough, No Hemoptysis, No Wheezing.    GI: No Heartburn, Indigestion, Abdominal Pain, Nausea, Vomiting, Diarrhea, Hematemesis, Hematochezia, Melena, Change in Bowel Habits,  Loss of Appetite  GU: No Dysuria, Change in Color of Urine, No Urgency or Frequency, No Flank pain.  Musculoskeletal: No Joint Pain or Swelling, No Decreased Range of Motion, No Back Pain.  Neurologic: No Syncope, No Seizures, Muscle Weakness, Paresthesia, +Legal Blindness, Vision Disturbance or Loss, No Diplopia, No Vertigo, No Difficulty Walking,  Skin: No Rash or Lesions. Psych: No Change in Mood or Affect, No Depression or Anxiety, No Memory  loss, No Confusion, or Hallucinations   Past Medical History  Diagnosis Date  . Anemia   . Diastolic dysfunction   . Retinitis pigmentosa     Blindness--has shunt --placed yrs ago in [place]..  . Arthritis     Gout  . Hypertension     dx--"long time"  . CKD (chronic kidney disease), stage IV     a. L upper extremity AV fistula created 07/2012.  [name] BPH (benign prostatic hyperplasia)   . Blind   . Gangrene of finger     a. 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  . Colon polyps   . Pericardial effusion     a. Noted on echo 10/2009. b. Again seen on echo 09/2013.  [name] CHF (congestive heart failure)   . COPD (chronic obstructive pulmonary disease)       Past Surgical History  Procedure Laterality Date  . Cataract extraction w/ intraocular lens  implant, bilateral Bilateral   . Knee ligament reconstruction Left   . Av fistula placement  07/15/2012    Procedure: ARTERIOVENOUS (AV) FISTULA CREATION;  Surgeon: [name], MD;  Location: [facility] OR;  Service: Vascular;  Laterality: Left;  . Amputation  12/30/2012    Procedure: AMPUTATION DIGIT;  Surgeon: [name], MD;  Location: [facility];  Service: Orthopedics;  Laterality: Left;  LEFT SMALL FINGER AMPUTATION  . Pericardiocentesis  09/2013  . Pericardial tap N/A 09/19/2013    Procedure: PERICARDIAL TAP;  Surgeon: [name], MD;  Location: [facility] CATH LAB;  Service: Cardiovascular;  Laterality: N/A;       Prior to Admission medications   Medication Sig Start Date End Date Taking? Authorizing Provider  acetaminophen (TYLENOL) 500 MG tablet Take 1,000 mg by mouth every 6 (six) hours as needed for moderate pain.   Yes Historical Provider, MD  allopurinol (ZYLOPRIM) 100 MG tablet Take 100 mg by mouth daily.    Yes Historical Provider, MD  amLODipine (NORVASC) 10 MG tablet Take 10 mg by mouth every evening.    Yes Historical Provider, MD  aspirin EC 81 MG tablet Take 81 mg by mouth daily.   Yes Historical  Provider, MD  bisacodyl (DULCOLAX) 5 MG EC tablet Take 5 mg by mouth daily as needed for moderate constipation.   Yes Historical Provider, MD  carvedilol (COREG) 25 MG tablet Take 25 mg by mouth 2 (two) times daily with a meal.   Yes Historical Provider, MD  clotrimazole-betamethasone (LOTRISONE) cream Apply 1 application topically daily.    Yes Historical Provider, MD  colchicine 0.6 MG tablet Take 0.6 mg by mouth daily as needed (gout attacks).    Yes Historical Provider, MD  diphenhydrAMINE (BENADRYL) 25 MG tablet Take 25 mg by mouth every 6 (six) hours as needed for itching or allergies (congestion).    Yes Historical Provider, MD  guaiFENesin (MUCINEX) 600 MG 12 hr tablet Take 1 tablet (600 mg total) by mouth 2 (two) times daily. Patient taking differently: Take 600 mg by mouth 2 (two) times daily as needed (congestion).  09/15/14  Yes [name], MD  ipratropium-albuterol (DUONEB) 0.5-2.5 (3) MG/3ML SOLN Take 3 mLs by nebulization every 4 (four) hours as needed. Patient taking differently: Take 3 mLs by nebulization every 4 (four) hours as needed (shortness of breath/wheezing).  09/15/14  Yes [name], MD  Tiotropium Bromide Monohydrate (SPIRIVA RESPIMAT) 2.5 MCG/ACT AERS Inhale 2 puffs into the lungs every morning. 08/04/14  Yes [name], MD  darbepoetin (ARANESP) 200 MCG/0.4ML SOLN injection Inject 200 mcg into the vein every 7 (seven) days. Take on Mondays 08/24/14   [name], DO  doxercalciferol (HECTOROL) 4 MCG/2ML injection Inject 0.5 mLs (1 mcg total) into the vein every Monday, Wednesday, and Friday with hemodialysis. 08/24/14   [name], DO  ferric gluconate 125 mg in sodium chloride 0.9 % 100 mL Inject 125 mg into the vein every Monday, Wednesday, and Friday with hemodialysis. 08/24/14   [name], DO  MOVIPREP 100 G SOLR Take 1 kit (200 g total) by mouth as directed. 12/31/14   [name], MD  multivitamin (RENA-VIT) TABS tablet Take 1 tablet by mouth at  bedtime. Patient not taking: Reported on 01/08/2015 09/15/14   [name], MD  pantoprazole (PROTONIX) 40 MG tablet Take 40 mg by mouth daily. 08/24/14   [name], DO      Allergies  Allergen Reactions  . Ibuprofen Other (See Comments)    Kidney problems-dialysis patient  . Nsaids Other (See Comments)    Kidney problems-dialysis patient     Social History:  reports that he has been smoking Cigarettes.  He has a 25 pack-year smoking history. He has never used smokeless tobacco. He reports that he does not drink alcohol or use illicit drugs.     Family History  Problem Relation Age of Onset  . Ovarian cancer Mother   . Colon cancer Neg Hx   . Colon polyps Brother   . Kidney disease Neg Hx   . Gallbladder disease Neg  Hx   . Esophageal cancer Neg Hx   . Heart disease Neg Hx        Physical Exam:  GEN: Pleasant  Elderly Thin 69 y.o.   African American  male examined  and in no acute distress; cooperative with exam Filed Vitals:   01/08/15 1900 01/08/15 1915 01/08/15 1930 01/08/15 1945  BP: 161/69 126/66 139/55 153/69  Pulse: 102 101    Temp:      TempSrc:      Resp: _0 SpO2: 100% 98% 99% 99%   Blood pressure 153/69, pulse 101, temperature 97.5 F (36.4 C), temperature source Oral, resp. rate 15, SpO2 99 %. PSYCH: He is alert and oriented x 2; does not appear anxious does not appear depressed; affect is normal HEENT: Normocephalic and Atraumatic, Mucous membranes pink; PERRLA; EOM intact; Fundi:  Benign;  No scleral icterus, Nares: Patent, Oropharynx: Clear,    Neck:  FROM, No Cervical Lymphadenopathy nor Thyromegaly or Carotid Bruit; No JVD; Breasts:: Not examined CHEST WALL: No tenderness CHEST: Normal respiration, clear to auscultation bilaterally HEART: Regular rate and rhythm; no murmurs rubs or gallops BACK: No kyphosis or scoliosis; No CVA tenderness ABDOMEN: Positive Bowel Sounds, Scaphoid, Soft Non-Tender; No Masses, No Organomegaly. Rectal  Exam: Not done EXTREMITIES: No Cyanosis, Clubbing, or Edema; No Ulcerations. Genitalia: not examined PULSES: 2+ and symmetric SKIN: Normal hydration no rash or ulceration CNS:  A x O x 2,  Legal Blindness, No Focal Deficits  Vascular: pulses palpable throughout    Labs on Admission:  Basic Metabolic Panel:  Recent Labs Lab 01/08/15 1753  NA 138  K 3.1*  CL 96  CO2 36*  GLUCOSE 89  BUN <5*  CREATININE 1.90*  CALCIUM 8.2*   Liver Function Tests: No results for input(s): AST, ALT, ALKPHOS, BILITOT, PROT, ALBUMIN in the last 168 hours. No results for input(s): LIPASE, AMYLASE in the last 168 hours. No results for input(s): AMMONIA in the last 168 hours. CBC:  Recent Labs Lab 01/08/15 1753  WBC 3.8*  NEUTROABS 2.9  HGB 6.7*  HCT 22.0*  MCV 92.4  PLT 114*   Cardiac Enzymes: No results for input(s): CKTOTAL, CKMB, CKMBINDEX, TROPONINI in the last 168 hours.  BNP (last 3 results)  Recent Labs  04/27/14 0600  PROBNP 8130.0*   CBG: No results for input(s): GLUCAP in the last 168 hours.  Radiological Exams on Admission: No results found.      Assessment/Plan:   69 y.o. male with  Active Problems:      1.    GI bleeding-  Hb= 6.7, and FOBT= HEME positive   IV Protonix   Monitor H/Hs   Transfuse 2 units pRBCs   Notify [facility] in AM    2.    Other pancytopenia   Had Bone Marrow Bx in 08/2014 by IR    was to Follow up with Heme/Onc Dr Earlie Server as [place]       3.    Essential hypertension, benign   Resume Carvedilol,and Amlodipine as BP and HR tolerate   Monitor BPs     4.    COPD (chronic obstructive pulmonary disease)   Continue DUONebs q 4 hrs PRN     5.    Chronic diastolic CHF (congestive heart failure)   Monitor for Signs of Fluid Overload     6.    ESRD (end stage renal disease) on dialysis- on Hemodialysis MWF  Notify Renal in AM       7.    DVT Prophylaxis   SCDs    Code Status:    FULL CODE Family  Communication:   Wife and Daughter at Bedside Disposition Plan:      Observation/Tele Bed  Time spent:  [place] [facility] Pager [phone]   If [place] Please Contact the Day Rounding Team MD for [facility]  If 7PM-7AM, Please Contact Night-Floor Coverage  [email or web address] Password [facility] 01/08/2015, 8:29 PM

## 2015-01-09 ENCOUNTER — Inpatient Hospital Stay (HOSPITAL_COMMUNITY): Payer: Medicare Other

## 2015-01-09 ENCOUNTER — Encounter (HOSPITAL_COMMUNITY): Payer: Self-pay | Admitting: Radiology

## 2015-01-09 DIAGNOSIS — Z89022 Acquired absence of left finger(s): Secondary | ICD-10-CM | POA: Diagnosis not present

## 2015-01-09 DIAGNOSIS — J441 Chronic obstructive pulmonary disease with (acute) exacerbation: Secondary | ICD-10-CM | POA: Diagnosis present

## 2015-01-09 DIAGNOSIS — N2581 Secondary hyperparathyroidism of renal origin: Secondary | ICD-10-CM | POA: Diagnosis present

## 2015-01-09 DIAGNOSIS — Z7982 Long term (current) use of aspirin: Secondary | ICD-10-CM | POA: Diagnosis not present

## 2015-01-09 DIAGNOSIS — Z888 Allergy status to other drugs, medicaments and biological substances status: Secondary | ICD-10-CM | POA: Diagnosis not present

## 2015-01-09 DIAGNOSIS — H548 Legal blindness, as defined in USA: Secondary | ICD-10-CM | POA: Diagnosis present

## 2015-01-09 DIAGNOSIS — M1990 Unspecified osteoarthritis, unspecified site: Secondary | ICD-10-CM | POA: Diagnosis present

## 2015-01-09 DIAGNOSIS — Z9841 Cataract extraction status, right eye: Secondary | ICD-10-CM | POA: Diagnosis not present

## 2015-01-09 DIAGNOSIS — I120 Hypertensive chronic kidney disease with stage 5 chronic kidney disease or end stage renal disease: Secondary | ICD-10-CM | POA: Diagnosis present

## 2015-01-09 DIAGNOSIS — I5032 Chronic diastolic (congestive) heart failure: Secondary | ICD-10-CM | POA: Diagnosis present

## 2015-01-09 DIAGNOSIS — D500 Iron deficiency anemia secondary to blood loss (chronic): Secondary | ICD-10-CM | POA: Diagnosis present

## 2015-01-09 DIAGNOSIS — Z9842 Cataract extraction status, left eye: Secondary | ICD-10-CM | POA: Diagnosis not present

## 2015-01-09 DIAGNOSIS — M109 Gout, unspecified: Secondary | ICD-10-CM | POA: Diagnosis present

## 2015-01-09 DIAGNOSIS — N186 End stage renal disease: Secondary | ICD-10-CM | POA: Diagnosis present

## 2015-01-09 DIAGNOSIS — F17210 Nicotine dependence, cigarettes, uncomplicated: Secondary | ICD-10-CM | POA: Diagnosis present

## 2015-01-09 DIAGNOSIS — N400 Enlarged prostate without lower urinary tract symptoms: Secondary | ICD-10-CM | POA: Diagnosis present

## 2015-01-09 DIAGNOSIS — Z961 Presence of intraocular lens: Secondary | ICD-10-CM | POA: Diagnosis present

## 2015-01-09 DIAGNOSIS — J90 Pleural effusion, not elsewhere classified: Secondary | ICD-10-CM

## 2015-01-09 DIAGNOSIS — Z86010 Personal history of colonic polyps: Secondary | ICD-10-CM | POA: Diagnosis not present

## 2015-01-09 DIAGNOSIS — K922 Gastrointestinal hemorrhage, unspecified: Secondary | ICD-10-CM | POA: Diagnosis present

## 2015-01-09 DIAGNOSIS — Z992 Dependence on renal dialysis: Secondary | ICD-10-CM | POA: Diagnosis not present

## 2015-01-09 DIAGNOSIS — D61818 Other pancytopenia: Secondary | ICD-10-CM | POA: Diagnosis present

## 2015-01-09 LAB — CBC
HEMATOCRIT: 29.7 % — AB (ref 39.0–52.0)
HEMOGLOBIN: 9.4 g/dL — AB (ref 13.0–17.0)
MCH: 28.4 pg (ref 26.0–34.0)
MCHC: 31.6 g/dL (ref 30.0–36.0)
MCV: 89.7 fL (ref 78.0–100.0)
PLATELETS: 106 10*3/uL — AB (ref 150–400)
RBC: 3.31 MIL/uL — ABNORMAL LOW (ref 4.22–5.81)
RDW: 16.8 % — ABNORMAL HIGH (ref 11.5–15.5)
WBC: 4.1 10*3/uL (ref 4.0–10.5)

## 2015-01-09 LAB — VITAMIN B12: VITAMIN B 12: 442 pg/mL (ref 211–911)

## 2015-01-09 LAB — BASIC METABOLIC PANEL
Anion gap: 5 (ref 5–15)
BUN: 7 mg/dL (ref 6–23)
CALCIUM: 8.1 mg/dL — AB (ref 8.4–10.5)
CO2: 34 mmol/L — ABNORMAL HIGH (ref 19–32)
Chloride: 97 mmol/L (ref 96–112)
Creatinine, Ser: 3.08 mg/dL — ABNORMAL HIGH (ref 0.50–1.35)
GFR calc Af Amer: 22 mL/min — ABNORMAL LOW (ref >90)
GFR, EST NON AFRICAN AMERICAN: 19 mL/min — AB (ref >90)
Glucose, Bld: 85 mg/dL (ref 70–99)
Potassium: 3.3 mmol/L — ABNORMAL LOW (ref 3.5–5.1)
Sodium: 136 mmol/L (ref 135–145)

## 2015-01-09 LAB — IRON AND TIBC
Iron: 44 ug/dL (ref 42–165)
Saturation Ratios: 28 % (ref 20–55)
TIBC: 158 ug/dL — ABNORMAL LOW (ref 215–435)
UIBC: 114 ug/dL — ABNORMAL LOW (ref 125–400)

## 2015-01-09 LAB — FOLATE: Folate: 13.4 ng/mL

## 2015-01-09 LAB — FERRITIN: FERRITIN: 1839 ng/mL — AB (ref 22–322)

## 2015-01-09 MED ORDER — IOHEXOL 300 MG/ML  SOLN
100.0000 mL | Freq: Once | INTRAMUSCULAR | Status: AC | PRN
  Administered 2015-01-09: 75 mL via INTRAVENOUS

## 2015-01-09 MED ORDER — CETYLPYRIDINIUM CHLORIDE 0.05 % MT LIQD
7.0000 mL | Freq: Two times a day (BID) | OROMUCOSAL | Status: DC
  Administered 2015-01-09 – 2015-01-14 (×11): 7 mL via OROMUCOSAL

## 2015-01-09 NOTE — Progress Notes (Signed)
UR completed 

## 2015-01-09 NOTE — Progress Notes (Signed)
PROGRESS NOTE  Cameron Mendez UJW:[id number] DOB: [birth year] DOA: 01/08/2015 PCP: [name], MD  Assessment/Plan: GI bleeding- Hb= 6.7, and FOBT= HEME positive IV Protonix Transfused 2 units pRBCs  -has appointment with GI in March   -monitor for bleeding Pleural effusion -CT scan of chest -may need thoracentesis- still smokes?-- ? Cancer  Other pancytopenia Had Bone Marrow Bx in 08/2014 by IR showed pancytopenia  Essential hypertension, benign Resume Carvedilol,and Amlodipine as BP and HR tolerate Monitor BPs  COPD (chronic obstructive pulmonary disease) Continue DUONebs q 4 hrs PRN   Chronic diastolic CHF (congestive heart failure) Monitor for Signs of Fluid Overload   ESRD (end stage renal disease) on dialysis- on Hemodialysis MWF renal consult  Tobacco abuse -encourage cessation  Code Status: full Family Communication: patient Disposition Plan:    Consultants:    Procedures:      HPI/Subjective: No complaints  Objective: Filed Vitals:   01/09/15 1018  BP: 170/68  Pulse: 100  Temp: 98.2 F (36.8 C)  Resp: 20    Intake/Output Summary (Last 24 hours) at 01/09/15 1249 Last data filed at 01/09/15 0500  Gross per 24 hour  Intake    669 ml  Output    255 ml  Net    414 ml   Filed Weights   01/08/15 2111  Weight: 59.376 kg (130 lb 14.4 oz)    Exam:   General:  Awake, NAD  Cardiovascular: rrr  Respiratory: coarse breath sounds b/l  Abdomen: +Bs, soft  Musculoskeletal: no edema   Data Reviewed: Basic Metabolic Panel:  Recent Labs Lab 01/08/15 1753 01/08/15 2140 01/09/15 0429  NA 138 139 136  K 3.1* 3.2* 3.3*  CL 96 95* 97  CO2 36* 33*  34*  GLUCOSE 89 106* 85  BUN <5* <5* 7  CREATININE 1.90* 2.32* 3.08*  CALCIUM 8.2* 8.6 8.1*   Liver Function Tests:  Recent Labs Lab 01/08/15 2140  AST 28  ALT 11  ALKPHOS 69  BILITOT 0.6  PROT 8.3  ALBUMIN 3.1*   No results for input(s): LIPASE, AMYLASE in the last 168 hours. No results for input(s): AMMONIA in the last 168 hours. CBC:  Recent Labs Lab 01/08/15 1753 01/08/15 2140 01/09/15 0429  WBC 3.8*  --  4.1  NEUTROABS 2.9  --   --   HGB 6.7* 7.2* 9.4*  HCT 22.0* 23.8* 29.7*  MCV 92.4  --  89.7  PLT 114*  --  106*   Cardiac Enzymes: No results for input(s): CKTOTAL, CKMB, CKMBINDEX, TROPONINI in the last 168 hours. BNP (last 3 results)  Recent Labs  04/27/14 0600  PROBNP 8130.0*   CBG: No results for input(s): GLUCAP in the last 168 hours.  No results found for this or any previous visit (from the past 240 hour(s)).   Studies: Dg Chest 2 View  01/08/2015   CLINICAL DATA:  Shortness of breath.  EXAM: CHEST  2 VIEW  COMPARISON:  September 14, 2014  FINDINGS: There is a sizable right pleural effusion. There is a degree of consolidation throughout much of the right middle and lower lobes. There is some patchy opacity in the left upper lobe posteriorly as well. The left lung is clear. Heart size is upper normal and pulmonary vascularity appears normal. No adenopathy. There is a shunt catheter along the right hemithorax. A fragment of a second catheter is noted in the right lower hemithorax extending into the abdomen. There is no demonstrable adenopathy. No bone lesions.  IMPRESSION:  Sizable right effusion with consolidation/ atelectatic change an much of the right lung. Left lung clear. Heart size upper normal. Fragment of a shunt catheter is noted in the right hemithorax as well as an apparent patent catheter on the right.   Electronically Signed   By: Lowella Grip M.D.   On: 01/08/2015 20:40    Scheduled Meds: . allopurinol  100 mg Oral Daily  . antiseptic  oral rinse  7 mL Mouth Rinse BID  . pantoprazole (PROTONIX) IV  40 mg Intravenous Q12H  . sodium chloride  3 mL Intravenous Q12H  . sodium chloride  3 mL Intravenous Q12H   Continuous Infusions:  Antibiotics Given (last 72 hours)    None      Active Problems:   Essential hypertension, benign   COPD (chronic obstructive pulmonary disease)   Other pancytopenia   GI bleeding   Chronic diastolic CHF (congestive heart failure)   ESRD (end stage renal disease) on dialysis   GI bleed   Pleural effusion    Time spent: 35 min    Jenae Tomasello, [facility] Pager [phone]. If 7PM-7AM, please contact night-coverage at [email or web address], password [facility] 01/09/2015, 12:49 PM  LOS: 1 day

## 2015-01-10 DIAGNOSIS — Z720 Tobacco use: Secondary | ICD-10-CM

## 2015-01-10 LAB — TYPE AND SCREEN
ABO/RH(D): O POS
Antibody Screen: NEGATIVE
UNIT DIVISION: 0
Unit division: 0

## 2015-01-10 LAB — BASIC METABOLIC PANEL
Anion gap: 9 (ref 5–15)
BUN: 13 mg/dL (ref 6–23)
CALCIUM: 8.2 mg/dL — AB (ref 8.4–10.5)
CHLORIDE: 97 mmol/L (ref 96–112)
CO2: 28 mmol/L (ref 19–32)
Creatinine, Ser: 3.95 mg/dL — ABNORMAL HIGH (ref 0.50–1.35)
GFR calc Af Amer: 17 mL/min — ABNORMAL LOW (ref >90)
GFR calc non Af Amer: 14 mL/min — ABNORMAL LOW (ref >90)
Glucose, Bld: 89 mg/dL (ref 70–99)
POTASSIUM: 3.5 mmol/L (ref 3.5–5.1)
Sodium: 134 mmol/L — ABNORMAL LOW (ref 135–145)

## 2015-01-10 LAB — CBC
HEMATOCRIT: 31 % — AB (ref 39.0–52.0)
HEMOGLOBIN: 9.6 g/dL — AB (ref 13.0–17.0)
MCH: 28.2 pg (ref 26.0–34.0)
MCHC: 31 g/dL (ref 30.0–36.0)
MCV: 90.9 fL (ref 78.0–100.0)
PLATELETS: 92 10*3/uL — AB (ref 150–400)
RBC: 3.41 MIL/uL — ABNORMAL LOW (ref 4.22–5.81)
RDW: 16.9 % — ABNORMAL HIGH (ref 11.5–15.5)
WBC: 3.6 10*3/uL — ABNORMAL LOW (ref 4.0–10.5)

## 2015-01-10 MED ORDER — METHYLPREDNISOLONE SODIUM SUCC 125 MG IJ SOLR
60.0000 mg | Freq: Two times a day (BID) | INTRAMUSCULAR | Status: DC
  Administered 2015-01-10 – 2015-01-12 (×5): 60 mg via INTRAVENOUS
  Filled 2015-01-10: qty 2
  Filled 2015-01-10 (×4): qty 0.96
  Filled 2015-01-10: qty 2
  Filled 2015-01-10: qty 0.96
  Filled 2015-01-10: qty 2
  Filled 2015-01-10: qty 0.96

## 2015-01-10 NOTE — Progress Notes (Signed)
PROGRESS NOTE  Cameron Mendez JXB:[id number] DOB: 04-10-1946 DOA: 01/08/2015 PCP: [name], MD  Assessment/Plan: Slow GI bleedi- Hb= 6.7, and FOBT= HEME positive IV Protonix Transfused 2 units pRBCs  -has appointment with GI in March for scopes   -monitor for bleeding   -h/h stable  Pleural effusion -CT scan of chest shows: Moderate size right pleural effusion with a partially loculated component. Right middle and lower lobe collapse -needs thoracentesis- still smokes?-- ? Cancer -pulm consult- appreciate help  Other pancytopenia Had Bone Marrow Bx in 08/2014 by IR showed pancytopenia  Essential hypertension, benign Resume Carvedilol,and Amlodipine as BP and HR tolerate Monitor BPs  COPD exacerbation Continue DUONebs q 4 hrs PRN   -solumedrol   Chronic diastolic CHF (congestive heart failure) Monitor for Signs of Fluid Overload   ESRD (end stage renal disease) on dialysis- on Hemodialysis MWF renal consult  Tobacco abuse -encourage cessation  Code Status: full Family Communication: patient/wife on phone Disposition Plan:    Consultants:    Procedures:      HPI/Subjective: No complaints  Objective: Filed Vitals:   01/10/15 0621  BP: 169/56  Pulse:   Temp: 98.4 F (36.9 C)  Resp: 21    Intake/Output Summary (Last 24 hours) at 01/10/15 0829 Last data filed at 01/10/15 1308  Gross per 24 hour  Intake    476 ml  Output    400 ml  Net     76 ml   Filed Weights   01/08/15 2111  Weight: 59.376 kg (130 lb 14.4 oz)    Exam:   General:  Awake- audible wheezing  Cardiovascular: rrr  Respiratory: wheezing b/l, diminished on right base  Abdomen: +Bs,  soft  Musculoskeletal: no edema   Data Reviewed: Basic Metabolic Panel:  Recent Labs Lab 01/08/15 1753 01/08/15 2140 01/09/15 0429 01/10/15 0345  NA 138 139 136 134*  K 3.1* 3.2* 3.3* 3.5  CL 96 95* 97 97  CO2 36* 33* 34* 28  GLUCOSE 89 106* 85 89  BUN <5* <5* 7 13  CREATININE 1.90* 2.32* 3.08* 3.95*  CALCIUM 8.2* 8.6 8.1* 8.2*   Liver Function Tests:  Recent Labs Lab 01/08/15 2140  AST 28  ALT 11  ALKPHOS 69  BILITOT 0.6  PROT 8.3  ALBUMIN 3.1*   No results for input(s): LIPASE, AMYLASE in the last 168 hours. No results for input(s): AMMONIA in the last 168 hours. CBC:  Recent Labs Lab 01/08/15 1753 01/08/15 2140 01/09/15 0429 01/10/15 0345  WBC 3.8*  --  4.1 3.6*  NEUTROABS 2.9  --   --   --   HGB 6.7* 7.2* 9.4* 9.6*  HCT 22.0* 23.8* 29.7* 31.0*  MCV 92.4  --  89.7 90.9  PLT 114*  --  106* 92*   Cardiac Enzymes: No results for input(s): CKTOTAL, CKMB, CKMBINDEX, TROPONINI in the last 168 hours. BNP (last 3 results)  Recent Labs  04/27/14 0600  PROBNP 8130.0*   CBG: No results for input(s): GLUCAP in the last 168 hours.  No results found for this or any previous visit (from the past 240 hour(s)).   Studies: Dg Chest 2 View  01/08/2015   CLINICAL DATA:  Shortness of breath.  EXAM: CHEST  2 VIEW  COMPARISON:  September 14, 2014  FINDINGS: There is a sizable right pleural effusion. There is a degree of consolidation throughout much of the right middle and lower lobes. There is some patchy opacity in the left upper lobe posteriorly as  well. The left lung is clear. Heart size is upper normal and pulmonary vascularity appears normal. No adenopathy. There is a shunt catheter along the right hemithorax. A fragment of a second catheter is noted in the right lower hemithorax extending into the abdomen. There is no demonstrable adenopathy. No bone lesions.  IMPRESSION: Sizable right effusion with consolidation/ atelectatic change an much of the right lung. Left  lung clear. Heart size upper normal. Fragment of a shunt catheter is noted in the right hemithorax as well as an apparent patent catheter on the right.   Electronically Signed   By: Lowella Grip M.D.   On: 01/08/2015 20:40   Ct Chest W Contrast  01/10/2015   CLINICAL DATA:  Pleural effusion, history of smoking  EXAM: CT CHEST WITH CONTRAST  TECHNIQUE: Multidetector CT imaging of the chest was performed during intravenous contrast administration.  CONTRAST:  79mL OMNIPAQUE IOHEXOL 300 MG/ML  SOLN  COMPARISON:  None.  FINDINGS: The central airways are patent. There is a moderate size right pleural effusion with a partially loculated component. There is right middle and lower lobe collapse. There is a trace left pleural effusion. There is no pleural effusion or pneumothorax.  There are no pathologically enlarged axillary, hilar or mediastinal lymph nodes.  The heart size is normal. There is no pericardial effusion. The thoracic aorta is normal in caliber. There is thoracic aortic atherosclerosis. There is coronary artery atherosclerosis of the left main and LAD.  Review of bone windows demonstrates no focal lytic or sclerotic lesions. There is a ventriculoperitoneal shunt catheter tubing noted.  Limited non-contrast images of the upper abdomen were obtained. The adrenal glands appear normal. There are subtle hypodense masses within the spleen with the largest measuring approximately 2.1 cm.  IMPRESSION: 1. Moderate size right pleural effusion with a partially loculated component. Right middle and lower lobe collapse. 2. Multiple hypodense splenic mass are of indeterminate etiology. There are at least 2 masses seen on a prior CT of the abdomen dated 05/18/2009.   Electronically Signed   By: Kathreen Devoid   On: 01/10/2015 00:07    Scheduled Meds: . allopurinol  100 mg Oral Daily  . antiseptic oral rinse  7 mL Mouth Rinse BID  . methylPREDNISolone (SOLU-MEDROL) injection  60 mg Intravenous Q12H  .  pantoprazole (PROTONIX) IV  40 mg Intravenous Q12H  . sodium chloride  3 mL Intravenous Q12H  . sodium chloride  3 mL Intravenous Q12H   Continuous Infusions:  Antibiotics Given (last 72 hours)    None      Active Problems:   Essential hypertension, benign   COPD (chronic obstructive pulmonary disease)   Other pancytopenia   GI bleeding   Chronic diastolic CHF (congestive heart failure)   ESRD (end stage renal disease) on dialysis   GI bleed   Pleural effusion   Tobacco abuse    Time spent: 25 min    Seairra Otani, [facility] Pager [phone]. If 7PM-7AM, please contact night-coverage at [email or web address], password [facility] 01/10/2015, 8:29 AM  LOS: 2 days

## 2015-01-10 NOTE — Consult Note (Signed)
Renal Service Consult Note [facility]  Cameron Mendez 01/10/2015 Cameron Mendez D Requesting Physician:  Dr Eliseo Squires  Reason for Consult:  ESRD patient with SOB and new pleural effusion HPI: The patient is a 69 y.o. year-old with hx of blindness d/t RP, ESRD on HD, HTN, hx ventriculostomy shunt, sent from HD to ED on 1/29 for low Hb.  Dark stools, had stool cards tested recently are some were positive. CXR done after admit showed new large R pleural effusion.  Pulm has been consulted.   Patient is wheezing, denies any SOB or CP.  No hemoptysis noted, though he is blind.     Chart review: Chart Review: 10/10 - SOB, acute / chronic diast HF, pulm edema, acute/CRF, HTN crisis, EF 70%, chronic thrombocytopenia, legal blind d/t RP, hx ventriculostomy shunt, anemia, gout, DJD > rx with IV lasix, BP meds, for OP stress test after dc 10/14 - SOB, acute pulm edema, hypoxemia, CKD IV, large pericardial effusion > rx with IV lasix and pericardiocentesis. Didn't improved so was started on hemodialysis. Improved. CP post pericardiocentesis rx'd with colchicine 5/15 - SOB/ vol excess / pulm edema > admit, rx with serial HD, down 9kg from admit. Also COPD flare rx by pulm team. Dry wt lowered. 9/11- 08/24/14 > Hb down at HD, sent to ED, Hb 6.2 > got 3u prbc's, ESA per renal, also diast HF, gout, copd 10/1- 09/15/14 > sent to ED for low Hb 6 at dialysis > rec'd 2u prbcs, hematology rec'd BM bx which was done on 10/6 by IR.  ESRD on HD. Fe supplement and ESA per renal.  COPD w flare rx with po abx   ROS  no fevers, prod cough  no abd pain, n/v/d  no jt pains  Past Medical History  Past Medical History  Diagnosis Date  . Anemia   . Diastolic dysfunction   . Retinitis pigmentosa     Blindness--has shunt --placed yrs ago in [place]..  . Arthritis     Gout  . Hypertension     dx--"long time"  . CKD (chronic kidney disease), stage IV     a. L upper extremity AV fistula created 07/2012.  [name]  BPH (benign prostatic hyperplasia)   . Blind   . Gangrene of finger     a. 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  . Colon polyps   . Pericardial effusion     a. Noted on echo 10/2009. b. Again seen on echo 09/2013.  [name] CHF (congestive heart failure)   . COPD (chronic obstructive pulmonary disease)    Past Surgical History  Past Surgical History  Procedure Laterality Date  . Cataract extraction w/ intraocular lens  implant, bilateral Bilateral   . Knee ligament reconstruction Left   . Av fistula placement  07/15/2012    Procedure: ARTERIOVENOUS (AV) FISTULA CREATION;  Surgeon: Rosetta Posner, MD;  Location: [place];  Service: Vascular;  Laterality: Left;  . Amputation  12/30/2012    Procedure: AMPUTATION DIGIT;  Surgeon: Tennis Must, MD;  Location: [place];  Service: Orthopedics;  Laterality: Left;  LEFT SMALL FINGER AMPUTATION  . Pericardiocentesis  09/2013  . Pericardial tap N/A 09/19/2013    Procedure: PERICARDIAL TAP;  Surgeon: Blane Ohara, MD;  Location: [facility] CATH LAB;  Service: Cardiovascular;  Laterality: N/A;   Family History  Family History  Problem Relation Age of Onset  . Ovarian cancer Mother   . Colon cancer Neg Hx   .  Colon polyps Brother   . Kidney disease Neg Hx   . Gallbladder disease Neg Hx   . Esophageal cancer Neg Hx   . Heart disease Neg Hx    Social History  reports that he has been smoking Cigarettes.  He has a 25 pack-year smoking history. He has never used smokeless tobacco. He reports that he does not drink alcohol or use illicit drugs. Allergies  Allergies  Allergen Reactions  . Ibuprofen Other (See Comments)    Kidney problems-dialysis patient  . Nsaids Other (See Comments)    Kidney problems-dialysis patient   Home medications Prior to Admission medications   Medication Sig Start Date End Date Taking? Authorizing Provider  acetaminophen (TYLENOL) 500 MG tablet Take 1,000 mg by mouth every 6 (six) hours as needed for  moderate pain.   Yes Historical Provider, MD  allopurinol (ZYLOPRIM) 100 MG tablet Take 100 mg by mouth daily.    Yes Historical Provider, MD  amLODipine (NORVASC) 10 MG tablet Take 10 mg by mouth every evening.    Yes Historical Provider, MD  aspirin EC 81 MG tablet Take 81 mg by mouth daily.   Yes Historical Provider, MD  bisacodyl (DULCOLAX) 5 MG EC tablet Take 5 mg by mouth daily as needed for moderate constipation.   Yes Historical Provider, MD  carvedilol (COREG) 25 MG tablet Take 25 mg by mouth 2 (two) times daily with a meal.   Yes Historical Provider, MD  clotrimazole-betamethasone (LOTRISONE) cream Apply 1 application topically daily.    Yes Historical Provider, MD  colchicine 0.6 MG tablet Take 0.6 mg by mouth daily as needed (gout attacks).    Yes Historical Provider, MD  diphenhydrAMINE (BENADRYL) 25 MG tablet Take 25 mg by mouth every 6 (six) hours as needed for itching or allergies (congestion).    Yes Historical Provider, MD  guaiFENesin (MUCINEX) 600 MG 12 hr tablet Take 1 tablet (600 mg total) by mouth 2 (two) times daily. Patient taking differently: Take 600 mg by mouth 2 (two) times daily as needed (congestion).  09/15/14  Yes [name], MD  ipratropium-albuterol (DUONEB) 0.5-2.5 (3) MG/3ML SOLN Take 3 mLs by nebulization every 4 (four) hours as needed. Patient taking differently: Take 3 mLs by nebulization every 4 (four) hours as needed (shortness of breath/wheezing).  09/15/14  Yes [name], MD  Tiotropium Bromide Monohydrate (SPIRIVA RESPIMAT) 2.5 MCG/ACT AERS Inhale 2 puffs into the lungs every morning. 08/04/14  Yes [name], MD  darbepoetin (ARANESP) 200 MCG/0.4ML SOLN injection Inject 200 mcg into the vein every 7 (seven) days. Take on Mondays 08/24/14   [name], DO  doxercalciferol (HECTOROL) 4 MCG/2ML injection Inject 0.5 mLs (1 mcg total) into the vein every Monday, Wednesday, and Friday with hemodialysis. 08/24/14   [name], DO  ferric gluconate  125 mg in sodium chloride 0.9 % 100 mL Inject 125 mg into the vein every Monday, Wednesday, and Friday with hemodialysis. 08/24/14   [name], DO  MOVIPREP 100 G SOLR Take 1 kit (200 g total) by mouth as directed. 12/31/14   [name], MD  multivitamin (RENA-VIT) TABS tablet Take 1 tablet by mouth at bedtime. Patient not taking: Reported on 01/08/2015 09/15/14   [name], MD  pantoprazole (PROTONIX) 40 MG tablet Take 40 mg by mouth daily. 08/24/14   [name], DO   Liver Function Tests  Recent Labs Lab 01/08/15 2140  AST 28  ALT 11  ALKPHOS 69  BILITOT 0.6  PROT 8.3  ALBUMIN 3.1*   No results for input(s): LIPASE, AMYLASE in the last 168 hours. CBC  Recent Labs Lab 01/08/15 1753 01/08/15 2140 01/09/15 0429 01/10/15 0345  WBC 3.8*  --  4.1 3.6*  NEUTROABS 2.9  --   --   --   HGB 6.7* 7.2* 9.4* 9.6*  HCT 22.0* 23.8* 29.7* 31.0*  MCV 92.4  --  89.7 90.9  PLT 114*  --  106* 92*   Basic Metabolic Panel  Recent Labs Lab 01/08/15 1753 01/08/15 2140 01/09/15 0429 01/10/15 0345  NA 138 139 136 134*  K 3.1* 3.2* 3.3* 3.5  CL 96 95* 97 97  CO2 36* 33* 34* 28  GLUCOSE 89 106* 85 89  BUN <5* <5* 7 13  CREATININE 1.90* 2.32* 3.08* 3.95*  CALCIUM 8.2* 8.6 8.1* 8.2*    Filed Vitals:   01/10/15 0207 01/10/15 0420 01/10/15 0621 01/10/15 0954  BP: 143/48  169/56 161/60  Pulse:    92  Temp: 98 F (36.7 C)  98.4 F (36.9 C) 98.1 F (36.7 C)  TempSrc: Oral  Oral Oral  Resp: 19  21 22   Height:      Weight:      SpO2: 94% 97% 97% 99%   Exam Alert, older adult male , some audible wheezing No rash, cyanosis or gangrene Sclera anicteric, throat clear No jvd Chest dec'd on R 1/2 up, L side mild- mod exp wheezing RRR no MRG Abd is soft, NTND, no mass or ascites No LE or UE edema LUA AVF patent Neuro is blind, pleasant, nonfocal  HD: [place] MWF 4h  400/A1.5   60.5kg  2/2.25 [place]  Heparin none  LUA AVF Aranesp 200/ wk,  Hectorol 2 ug TIW  Last echo  2014  - EF 60%   Assessment: 1. Pleural effusion R lung - new, large, smoker 2. Wheezing - not sure , BP's are up, there could be a volume component vs COPD related. CXR left side no edema.  3. ESRD on HD MWF, has not missed HD, is at dry wt. BP's up 4. Anemia chronic issues, cont ESA, s/p prbc's, Hb 6.7 > 9 5. MBD cont binder and vit D 6. Blind d/t RP 7. COPD   Plan- HD tomorrow, reduce volume, pulm consult pending for #1/2  Kelly Splinter MD (pgr) [phone]    [phone] 01/10/2015, 12:04 PM

## 2015-01-11 ENCOUNTER — Inpatient Hospital Stay (HOSPITAL_COMMUNITY): Payer: Medicare Other

## 2015-01-11 DIAGNOSIS — K922 Gastrointestinal hemorrhage, unspecified: Secondary | ICD-10-CM

## 2015-01-11 HISTORY — DX: Gastrointestinal hemorrhage, unspecified: K92.2

## 2015-01-11 LAB — PROTEIN, BODY FLUID: Total protein, fluid: 4.3 g/dL

## 2015-01-11 LAB — COMPREHENSIVE METABOLIC PANEL
ALBUMIN: 3.2 g/dL — AB (ref 3.5–5.2)
ALK PHOS: 76 U/L (ref 39–117)
ALT: 13 U/L (ref 0–53)
AST: 29 U/L (ref 0–37)
Anion gap: 10 (ref 5–15)
BILIRUBIN TOTAL: 0.5 mg/dL (ref 0.3–1.2)
BUN: 14 mg/dL (ref 6–23)
CALCIUM: 8.9 mg/dL (ref 8.4–10.5)
CHLORIDE: 101 mmol/L (ref 96–112)
CO2: 27 mmol/L (ref 19–32)
Creatinine, Ser: 3.81 mg/dL — ABNORMAL HIGH (ref 0.50–1.35)
GFR calc Af Amer: 17 mL/min — ABNORMAL LOW (ref >90)
GFR calc non Af Amer: 15 mL/min — ABNORMAL LOW (ref >90)
Glucose, Bld: 101 mg/dL — ABNORMAL HIGH (ref 70–99)
Potassium: 5.7 mmol/L — ABNORMAL HIGH (ref 3.5–5.1)
Sodium: 138 mmol/L (ref 135–145)
Total Protein: 7.9 g/dL (ref 6.0–8.3)

## 2015-01-11 LAB — BODY FLUID CELL COUNT WITH DIFFERENTIAL
EOS FL: 1 %
LYMPHS FL: 48 %
MONOCYTE-MACROPHAGE-SEROUS FLUID: 36 % — AB (ref 50–90)
NEUTROPHIL FLUID: 15 % (ref 0–25)
Total Nucleated Cell Count, Fluid: 193 cu mm (ref 0–1000)

## 2015-01-11 LAB — PROTIME-INR
INR: 1.08 (ref 0.00–1.49)
PROTHROMBIN TIME: 14.1 s (ref 11.6–15.2)

## 2015-01-11 LAB — LACTATE DEHYDROGENASE: LDH: 179 U/L (ref 94–250)

## 2015-01-11 LAB — LACTATE DEHYDROGENASE, PLEURAL OR PERITONEAL FLUID: LD FL: 92 U/L — AB (ref 3–23)

## 2015-01-11 LAB — GLUCOSE, SEROUS FLUID: Glucose, Fluid: 123 mg/dL

## 2015-01-11 LAB — HEPATITIS B SURFACE ANTIGEN: Hepatitis B Surface Ag: NEGATIVE

## 2015-01-11 MED ORDER — HEPARIN SODIUM (PORCINE) 1000 UNIT/ML DIALYSIS: 1000.0000 [IU] | INTRAMUSCULAR | Status: DC | PRN

## 2015-01-11 MED ORDER — SODIUM CHLORIDE 0.9 % IV SOLN: 100.0000 mL | INTRAVENOUS | Status: DC | PRN

## 2015-01-11 MED ORDER — LIDOCAINE HCL (PF) 1 % IJ SOLN: 5.0000 mL | INTRAMUSCULAR | Status: DC | PRN

## 2015-01-11 MED ORDER — LIDOCAINE-PRILOCAINE 2.5-2.5 % EX CREA: 1.0000 "application " | TOPICAL_CREAM | CUTANEOUS | Status: DC | PRN

## 2015-01-11 MED ORDER — NEPRO/CARBSTEADY PO LIQD
237.0000 mL | ORAL | Status: DC | PRN
  Filled 2015-01-11: qty 237

## 2015-01-11 MED ORDER — PENTAFLUOROPROP-TETRAFLUOROETH EX AERO: 1.0000 "application " | INHALATION_SPRAY | CUTANEOUS | Status: DC | PRN

## 2015-01-11 MED ORDER — ALTEPLASE 2 MG IJ SOLR
2.0000 mg | Freq: Once | INTRAMUSCULAR | Status: DC | PRN
  Filled 2015-01-11: qty 2

## 2015-01-11 NOTE — Procedures (Signed)
Thoracentesis Procedure Note  Pre-operative Diagnosis: Large Right Pleural Effusion  Post-operative Diagnosis: same  Indications: Diagnostic and Therapeutic  Procedure Details  Consent: Informed consent was obtained. Risks of the procedure were discussed including: infection, bleeding, pain, pneumothorax.  Under sterile conditions the patient was positioned. Betadine solution and sterile drapes were utilized.  1% buffered lidocaine was used to anesthetize the 7th rib space. Fluid was obtained without any difficulties and minimal blood loss.  A dressing was applied to the wound and wound care instructions were provided.   Findings 1400 ml of clear yellow pleural fluid was obtained. A sample was sent to Pathology for cytogenetics, flow, and cell counts, as well as for infection analysis.  Complications:  None; patient tolerated the procedure well.          Condition: stable  Plan A follow up chest x-ray was ordered. Bed Rest for 1 hours. Tylenol 650 mg. for pain.      Montey Hora, [place] [facility] Pgr: [phone]  or [phone] 01/11/2015, 5:25 PM    I was present for procedure.  Chesley Mires, MD [facility] Pulmonary/Critical Care 01/11/2015, 5:34 PM Pager:  [phone] After 3pm call: [phone]

## 2015-01-11 NOTE — Consult Note (Signed)
Name: Cameron Mendez MRN: [id number] DOB: 1946/11/28    ADMISSION DATE:  01/08/2015 CONSULTATION DATE:  2/1  REFERRING MD :  Eliseo Squires ([facility])   CHIEF COMPLAINT:  Pleural effusion   BRIEF PATIENT DESCRIPTION: 69yo male active smoker with hx ESRD, HTN, dCHF, COPD sent from his usual HD to the ER 1/29 after his hemoglobin was found to be low.  Found to have heme POS stool and pancytopenia.   Admitted by [facility] with GI bleed.  CXR and CT chest revealed partially loculated R pleural effusion and PCCM consulted.   SIGNIFICANT EVENTS    STUDIES:  CT chest 1/30>>>mod R partially loculated pleural effusion with RML and RLL collapse.    HISTORY OF PRESENT ILLNESS:  69yo male active smoker with hx ESRD, HTN, dCHF, COPD sent from his usual HD to the ER 1/29 after his hemoglobin was found to be low.  Found to have heme POS stool and pancytopenia.   Admitted by [facility] with GI bleed.  CXR and CT chest revealed partially loculated R pleural effusion and PCCM consulted.  Currently feeling a little better.  Has been slightly more SOB.  Does not wear oxygen at home.  Still smoking 1 1/2 ppd.  Has lost weight but unsure how much given fluctuations with HD but notes that his clothes are too big.  Denies chest pain, fever, hemoptysis, purulent sputum.   PAST MEDICAL HISTORY :   has a past medical history of Anemia; Diastolic dysfunction; Retinitis pigmentosa; Arthritis; Hypertension; CKD (chronic kidney disease), stage IV; BPH (benign prostatic hyperplasia); Blind; Gangrene of finger; Colon polyps; Pericardial effusion; CHF (congestive heart failure); and COPD (chronic obstructive pulmonary disease).  has past surgical history that includes Cataract extraction w/ intraocular lens  implant, bilateral (Bilateral); Knee ligament reconstruction (Left); AV fistula placement (07/15/2012); Amputation (12/30/2012); Pericardiocentesis (09/2013); and pericardial tap (N/A, 09/19/2013). Prior to Admission medications   Medication  Sig Start Date End Date Taking? Authorizing Provider  acetaminophen (TYLENOL) 500 MG tablet Take 1,000 mg by mouth every 6 (six) hours as needed for moderate pain.   Yes Historical Provider, MD  allopurinol (ZYLOPRIM) 100 MG tablet Take 100 mg by mouth daily.    Yes Historical Provider, MD  amLODipine (NORVASC) 10 MG tablet Take 10 mg by mouth every evening.    Yes Historical Provider, MD  aspirin EC 81 MG tablet Take 81 mg by mouth daily.   Yes Historical Provider, MD  bisacodyl (DULCOLAX) 5 MG EC tablet Take 5 mg by mouth daily as needed for moderate constipation.   Yes Historical Provider, MD  carvedilol (COREG) 25 MG tablet Take 25 mg by mouth 2 (two) times daily with a meal.   Yes Historical Provider, MD  clotrimazole-betamethasone (LOTRISONE) cream Apply 1 application topically daily.    Yes Historical Provider, MD  colchicine 0.6 MG tablet Take 0.6 mg by mouth daily as needed (gout attacks).    Yes Historical Provider, MD  diphenhydrAMINE (BENADRYL) 25 MG tablet Take 25 mg by mouth every 6 (six) hours as needed for itching or allergies (congestion).    Yes Historical Provider, MD  guaiFENesin (MUCINEX) 600 MG 12 hr tablet Take 1 tablet (600 mg total) by mouth 2 (two) times daily. Patient taking differently: Take 600 mg by mouth 2 (two) times daily as needed (congestion).  09/15/14  Yes [name], MD  ipratropium-albuterol (DUONEB) 0.5-2.5 (3) MG/3ML SOLN Take 3 mLs by nebulization every 4 (four) hours as needed. Patient taking differently: Take 3 mLs  by nebulization every 4 (four) hours as needed (shortness of breath/wheezing).  09/15/14  Yes [name], MD  Tiotropium Bromide Monohydrate (SPIRIVA RESPIMAT) 2.5 MCG/ACT AERS Inhale 2 puffs into the lungs every morning. 08/04/14  Yes [name], MD  darbepoetin (ARANESP) 200 MCG/0.4ML SOLN injection Inject 200 mcg into the vein every 7 (seven) days. Take on Mondays 08/24/14   [name], DO  doxercalciferol (HECTOROL) 4 MCG/2ML  injection Inject 0.5 mLs (1 mcg total) into the vein every Monday, Wednesday, and Friday with hemodialysis. 08/24/14   [name], DO  ferric gluconate 125 mg in sodium chloride 0.9 % 100 mL Inject 125 mg into the vein every Monday, Wednesday, and Friday with hemodialysis. 08/24/14   [name], DO  MOVIPREP 100 G SOLR Take 1 kit (200 g total) by mouth as directed. 12/31/14   [name], MD  multivitamin (RENA-VIT) TABS tablet Take 1 tablet by mouth at bedtime. Patient not taking: Reported on 01/08/2015 09/15/14   [name], MD  pantoprazole (PROTONIX) 40 MG tablet Take 40 mg by mouth daily. 08/24/14   [name], DO   Allergies  Allergen Reactions  . Ibuprofen Other (See Comments)    Kidney problems-dialysis patient  . Nsaids Other (See Comments)    Kidney problems-dialysis patient    FAMILY HISTORY:  family history includes Colon polyps in his brother; Ovarian cancer in his mother. There is no history of Colon cancer, Kidney disease, Gallbladder disease, Esophageal cancer, or Heart disease. SOCIAL HISTORY:  reports that he has been smoking Cigarettes.  He has a 25 pack-year smoking history. He has never used smokeless tobacco. He reports that he does not drink alcohol or use illicit drugs.  REVIEW OF SYSTEMS:   As per HPI - All other systems reviewed and were neg.    SUBJECTIVE:   VITAL SIGNS: Temp:  [98 F (36.7 C)-98.9 F (37.2 C)] 98.2 F (36.8 C) (02/01 0700) Pulse Rate:  [90-108] 94 (02/01 0930) Resp:  [16-22] 16 (02/01 0700) BP: (109-169)/(51-80) 109/66 mmHg (02/01 0930) SpO2:  [94 %-100 %] 100 % (02/01 0700) Weight:  [133 lb 6.1 oz (60.5 kg)] 133 lb 6.1 oz (60.5 kg) (02/01 0700)  PHYSICAL EXAMINATION: General:  Pleasant, thin male, NAD on HD  Neuro:  Awake, alert, appropriate, MAE  HEENT:  Eyes wandering r/t blindness, mm moist, no JVD  Cardiovascular:  s1s2 rrr Lungs:  resps even non labored on [place], diminished R, exp wheeze  Abdomen:  Round, soft, +bs   Musculoskeletal:  Warm and dry, no sig edema    Recent Labs Lab 01/08/15 2140 01/09/15 0429 01/10/15 0345  NA 139 136 134*  K 3.2* 3.3* 3.5  CL 95* 97 97  CO2 33* 34* 28  BUN <5* 7 13  CREATININE 2.32* 3.08* 3.95*  GLUCOSE 106* 85 89    Recent Labs Lab 01/08/15 1753 01/08/15 2140 01/09/15 0429 01/10/15 0345  HGB 6.7* 7.2* 9.4* 9.6*  HCT 22.0* 23.8* 29.7* 31.0*  WBC 3.8*  --  4.1 3.6*  PLT 114*  --  106* 92*   Ct Chest W Contrast  01/10/2015   CLINICAL DATA:  Pleural effusion, history of smoking  EXAM: CT CHEST WITH CONTRAST  TECHNIQUE: Multidetector CT imaging of the chest was performed during intravenous contrast administration.  CONTRAST:  58m OMNIPAQUE IOHEXOL 300 MG/ML  SOLN  COMPARISON:  None.  FINDINGS: The central airways are patent. There is a moderate size right pleural effusion with  a partially loculated component. There is right middle and lower lobe collapse. There is a trace left pleural effusion. There is no pleural effusion or pneumothorax.  There are no pathologically enlarged axillary, hilar or mediastinal lymph nodes.  The heart size is normal. There is no pericardial effusion. The thoracic aorta is normal in caliber. There is thoracic aortic atherosclerosis. There is coronary artery atherosclerosis of the left main and LAD.  Review of bone windows demonstrates no focal lytic or sclerotic lesions. There is a ventriculoperitoneal shunt catheter tubing noted.  Limited non-contrast images of the upper abdomen were obtained. The adrenal glands appear normal. There are subtle hypodense masses within the spleen with the largest measuring approximately 2.1 cm.  IMPRESSION: 1. Moderate size right pleural effusion with a partially loculated component. Right middle and lower lobe collapse. 2. Multiple hypodense splenic mass are of indeterminate etiology. There are at least 2 masses seen on a prior CT of the abdomen dated 05/18/2009.   Electronically Signed   By: Kathreen Devoid    On: 01/10/2015 00:07    ASSESSMENT / PLAN:   Large R pleural effusion - certainly some concern for malignancy given weight loss, extensive smoking hx.  Although he does have hx dCHF, doubt directly r/t volume overload as he is at dry weight, has not missed HD, does not appear volume overloaded otherwise.  Tobacco abuse  COPD +/- AECOPD  REC-  Needs thoracentesis and send for cytology  Will eval with u/s post HD as appears partially loculated on CT  Check coags  Cont duonebs  pulm hygiene  Supplemental O2 as needed to keep sats >90%  Cont IV steroids for now   GI bleed  Anemia  ESRD  dCHF  Per IM    Nickolas Madrid, NP 01/11/2015  10:03 AM Pager: [phone] or [phone]   Reviewed above, examined.  He has dyspnea with exertion.  He has decreased BS on Rt related to large pleural effusion.  Will check with u/s and then assess for thoracentesis.  Procedure explained, and risks detailed as bleeding, infection, and PTX.  He is agreeable to have procedure.  Chesley Mires, MD [facility] Pulmonary/Critical Care 01/11/2015, 4:08 PM Pager:  [phone] After 3pm call: [phone]

## 2015-01-11 NOTE — Progress Notes (Signed)
PROGRESS NOTE  Cameron Mendez SWH:[id number] DOB: 1946-09-08 DOA: 01/08/2015 PCP: [name], MD  Assessment/Plan: Slow GI bleedi- Hb= 6.7, and FOBT= HEME positive IV Protonix Transfused 2 units pRBCs  -has appointment with GI in March for scopes   -monitor for bleeding   -h/h stable  Pleural effusion -CT scan of chest shows: Moderate size right pleural effusion with a partially loculated component. Right middle and lower lobe collapse -needs thoracentesis- still smokes?-- ? Cancer -pulm consult- appreciate help  Other pancytopenia Had Bone Marrow Bx in 08/2014 by IR showed pancytopenia  Essential hypertension, benign Resume Carvedilol,and Amlodipine as BP and HR tolerate Monitor BPs  COPD exacerbation Continue DUONebs q 4 hrs PRN   -solumedrol   Chronic diastolic CHF (congestive heart failure) Monitor for Signs of Fluid Overload   ESRD (end stage renal disease) on dialysis- on Hemodialysis MWF renal consult  Tobacco abuse -encourage cessation  Code Status: full Family Communication: patient/wife on phone 1/31 Disposition Plan:    Consultants:  Renal  pulm  Procedures:      HPI/Subjective: Wheezing better  Objective: Filed Vitals:   01/11/15 0800  BP: 152/70  Pulse: 92  Temp:   Resp:     Intake/Output Summary (Last 24 hours) at 01/11/15 0818 Last data filed at 01/11/15 0610  Gross per 24 hour  Intake    600 ml  Output    750 ml  Net   -150 ml   Filed Weights   01/08/15 2111 01/11/15 0700  Weight: 59.376 kg (130 lb 14.4 oz) 60.5 kg (133 lb 6.1 oz)    Exam:   General:  Awake- no overnight events  Cardiovascular: rrr  Respiratory: wheezing b/l,  diminished on right base  Abdomen: +Bs, soft  Musculoskeletal: no edema   Data Reviewed: Basic Metabolic Panel:  Recent Labs Lab 01/08/15 1753 01/08/15 2140 01/09/15 0429 01/10/15 0345  NA 138 139 136 134*  K 3.1* 3.2* 3.3* 3.5  CL 96 95* 97 97  CO2 36* 33* 34* 28  GLUCOSE 89 106* 85 89  BUN <5* <5* 7 13  CREATININE 1.90* 2.32* 3.08* 3.95*  CALCIUM 8.2* 8.6 8.1* 8.2*   Liver Function Tests:  Recent Labs Lab 01/08/15 2140  AST 28  ALT 11  ALKPHOS 69  BILITOT 0.6  PROT 8.3  ALBUMIN 3.1*   No results for input(s): LIPASE, AMYLASE in the last 168 hours. No results for input(s): AMMONIA in the last 168 hours. CBC:  Recent Labs Lab 01/08/15 1753 01/08/15 2140 01/09/15 0429 01/10/15 0345  WBC 3.8*  --  4.1 3.6*  NEUTROABS 2.9  --   --   --   HGB 6.7* 7.2* 9.4* 9.6*  HCT 22.0* 23.8* 29.7* 31.0*  MCV 92.4  --  89.7 90.9  PLT 114*  --  106* 92*   Cardiac Enzymes: No results for input(s): CKTOTAL, CKMB, CKMBINDEX, TROPONINI in the last 168 hours. BNP (last 3 results)  Recent Labs  04/27/14 0600  PROBNP 8130.0*   CBG: No results for input(s): GLUCAP in the last 168 hours.  No results found for this or any previous visit (from the past 240 hour(s)).   Studies: Ct Chest W Contrast  01/10/2015   CLINICAL DATA:  Pleural effusion, history of smoking  EXAM: CT CHEST WITH CONTRAST  TECHNIQUE: Multidetector CT imaging of the chest was performed during intravenous contrast administration.  CONTRAST:  66mL OMNIPAQUE IOHEXOL 300 MG/ML  SOLN  COMPARISON:  None.  FINDINGS: The central airways  are patent. There is a moderate size right pleural effusion with a partially loculated component. There is right middle and lower lobe collapse. There is a trace left pleural effusion. There is no pleural effusion or pneumothorax.  There are no pathologically enlarged axillary, hilar or mediastinal lymph nodes.  The heart size is normal. There is no pericardial effusion. The thoracic  aorta is normal in caliber. There is thoracic aortic atherosclerosis. There is coronary artery atherosclerosis of the left main and LAD.  Review of bone windows demonstrates no focal lytic or sclerotic lesions. There is a ventriculoperitoneal shunt catheter tubing noted.  Limited non-contrast images of the upper abdomen were obtained. The adrenal glands appear normal. There are subtle hypodense masses within the spleen with the largest measuring approximately 2.1 cm.  IMPRESSION: 1. Moderate size right pleural effusion with a partially loculated component. Right middle and lower lobe collapse. 2. Multiple hypodense splenic mass are of indeterminate etiology. There are at least 2 masses seen on a prior CT of the abdomen dated 05/18/2009.   Electronically Signed   By: Kathreen Devoid   On: 01/10/2015 00:07    Scheduled Meds: . allopurinol  100 mg Oral Daily  . antiseptic oral rinse  7 mL Mouth Rinse BID  . methylPREDNISolone (SOLU-MEDROL) injection  60 mg Intravenous Q12H  . pantoprazole (PROTONIX) IV  40 mg Intravenous Q12H  . sodium chloride  3 mL Intravenous Q12H  . sodium chloride  3 mL Intravenous Q12H   Continuous Infusions:  Antibiotics Given (last 72 hours)    None      Active Problems:   Essential hypertension, benign   COPD (chronic obstructive pulmonary disease)   Other pancytopenia   GI bleeding   Chronic diastolic CHF (congestive heart failure)   ESRD (end stage renal disease) on dialysis   GI bleed   Pleural effusion   Tobacco abuse    Time spent: 25 min    Charidy Cappelletti, [place] [facility] Pager [phone]. If 7PM-7AM, please contact night-coverage at [email or web address], password [facility] 01/11/2015, 8:18 AM  LOS: 3 days

## 2015-01-12 MED ORDER — AMLODIPINE BESYLATE 10 MG PO TABS
10.0000 mg | ORAL_TABLET | Freq: Every day | ORAL | Status: DC
  Administered 2015-01-12 – 2015-01-13 (×2): 10 mg via ORAL
  Filled 2015-01-12 (×4): qty 1

## 2015-01-12 MED ORDER — METHYLPREDNISOLONE SODIUM SUCC 40 MG IJ SOLR
40.0000 mg | Freq: Every day | INTRAMUSCULAR | Status: DC
Start: 2015-01-12 — End: 2015-01-13
  Administered 2015-01-12 – 2015-01-13 (×2): 40 mg via INTRAVENOUS
  Filled 2015-01-12 (×2): qty 1

## 2015-01-12 MED ORDER — CARVEDILOL 25 MG PO TABS
25.0000 mg | ORAL_TABLET | Freq: Two times a day (BID) | ORAL | Status: DC
  Administered 2015-01-12 – 2015-01-13 (×3): 25 mg via ORAL
  Filled 2015-01-12 (×8): qty 1

## 2015-01-12 MED ORDER — METHYLPREDNISOLONE SODIUM SUCC 40 MG IJ SOLR
40.0000 mg | Freq: Two times a day (BID) | INTRAMUSCULAR | Status: DC
  Filled 2015-01-12 (×2): qty 1

## 2015-01-12 MED ORDER — RENA-VITE PO TABS
1.0000 | ORAL_TABLET | Freq: Every day | ORAL | Status: DC
  Administered 2015-01-12 – 2015-01-13 (×2): 1 via ORAL
  Filled 2015-01-12 (×4): qty 1

## 2015-01-12 MED ORDER — NEPRO/CARBSTEADY PO LIQD
237.0000 mL | Freq: Two times a day (BID) | ORAL | Status: DC
  Administered 2015-01-12 – 2015-01-14 (×4): 237 mL via ORAL

## 2015-01-12 NOTE — Progress Notes (Signed)
[facility] Progress Note  Assessment/Plan: 1. Right pleural effusion s/p 1.4 L thora, studies pending- follwed by pul as an outpt for COPD/ last seen 1/5 1. Slow GIB - transfused 2 units prior to adm- had been declining from 8s in late Dec to 7s in Jan + FOBT - had been referred to GI. Tsats low despite IV Fe repeletion - ferritin 1200 - GI saw 1/21 - outpt endo scheduled for 3/6  2. ESRD - MWF - net UF 2.2 2/1 with post weight of 57.6 - below prior edw - no heparin HD; d/c foley catheter  3. Anemia - Hgb 6.7 1/29 - transfused 2 units - up to 9.6 1/31 CBC pending - resume Aranesp 4. Secondary hyperparathyroidism - resume Hectorol  - if P up can resume phoslo 1 ac tid 5. HTN/volume - BP still up some - continue to lower volume as tolerated; resume BP meds - norvasc 10 and coreg 25 bid. 6. Nutrition - has been losing weight, alb 3.2 - add suppl, vitamin- high risk for occult cancer 7. Chronic Thrombocytopenia - platelets actually higher than usual - had neg BM bx in Oct for pancytopenia  Myriam Jacobson, PA-C [place] [phone] 01/12/2015,10:16 AM  LOS: 4 days    Renal Attending: Feels better after thoracentesis. Will plan for dialysis in AM. Agree with note articulated above. Omarrion Carmer C   Subjective:   My wife thinks I'm losing weight, denies pain or sob  Objective Filed Vitals:   01/11/15 2137 01/12/15 0316 01/12/15 0618 01/12/15 0808  BP: 146/67 151/61 148/63 139/58  Pulse: 88   88  Temp: 98.2 F (36.8 C) 98.3 F (36.8 C) 98.2 F (36.8 C) 98 F (36.7 C)  TempSrc: Oral Oral Oral Oral  Resp: 18 17 16 18   Height:      Weight:      SpO2: 100% 100% 97% 96%   Physical Exam General: NAD Heart: RRR Lungs: no rales Abdomen: soft NT Extremities: tr brawny edema, SCDS Dialysis Access: left upper AVF + bruit  Dialysis Orders:  HD: [place] MWF4h 400/A1.5 60.5kg- last iven 1/27 2/2.25 [place] Heparin none LUA AVF Aranesp 200/ wk,  Hectorol 2 ug TIW - tsat 16% ferritin 1200 - needs more venofer due to chronic GI loss  Additional Objective Labs: Basic Metabolic Panel:  Recent Labs Lab 01/09/15 0429 01/10/15 0345 01/11/15 1823  NA 136 134* 138  K 3.3* 3.5 5.7*  CL 97 97 101  CO2 34* 28 27  GLUCOSE 85 89 101*  BUN 7 13 14   CREATININE 3.08* 3.95* 3.81*  CALCIUM 8.1* 8.2* 8.9   Liver Function Tests:  Recent Labs Lab 01/08/15 2140 01/11/15 1823  AST 28 29  ALT 11 13  ALKPHOS 69 76  BILITOT 0.6 0.5  PROT 8.3 7.9  ALBUMIN 3.1* 3.2*   CBC:  Recent Labs Lab 01/08/15 1753 01/08/15 2140 01/09/15 0429 01/10/15 0345  WBC 3.8*  --  4.1 3.6*  NEUTROABS 2.9  --   --   --   HGB 6.7* 7.2* 9.4* 9.6*  HCT 22.0* 23.8* 29.7* 31.0*  MCV 92.4  --  89.7 90.9  PLT 114*  --  106* 92*   Blood Culture    Component Value Date/Time   SDES PLEURAL FLUID RIGHT 01/11/2015 1730   SPECREQUEST NONE 01/11/2015 1730   CULT NO GROWTH Performed at [facility]  01/11/2015 1730   REPTSTATUS PENDING 01/11/2015 1730    Studies/Results: Dg Chest  Port 1 View  01/11/2015   CLINICAL DATA:  Right-sided pain following thoracentesis.  EXAM: PORTABLE CHEST - 1 VIEW  COMPARISON:  Radiograph 01/08/2015  FINDINGS: Ventriculostomy catheter noted along the right medial chest wall. Stable mildly enlarged cardiac silhouette. There is interval decrease in the right pleural effusion following thoracentesis. Small amount of loculated effusion does remain at the right lung base. No evidence pneumothorax.  IMPRESSION: 1. No evidence of pneumothorax following right thoracentesis. 2. Decrease in volume of right pleural fluid with basilar loculated fluid remaining.   Electronically Signed   By: Suzy Bouchard M.D.   On: 01/11/2015 17:32   Medications:   . allopurinol  100 mg Oral Daily  . antiseptic oral rinse  7 mL Mouth Rinse BID  . methylPREDNISolone (SOLU-MEDROL) injection  40 mg Intravenous Q12H  . pantoprazole (PROTONIX) IV  40  mg Intravenous Q12H  . sodium chloride  3 mL Intravenous Q12H  . sodium chloride  3 mL Intravenous Q12H

## 2015-01-12 NOTE — Evaluation (Signed)
Physical Therapy Evaluation Patient Details Name: Cameron Mendez MRN: [id number] DOB: [birth year] Today's Date: 01/12/2015   History of Present Illness  Cameron Mendez is a 69 y.o. male with a history of ESRD on HD MWF, Pancytopenia, HTN, Diastolic CHF, and COPD who was sent from dialysis to the ED after his hemoglobin was found to be low.  In the ED on recheck his hemoglobin was found to be 6.7.  Pt found to have slow GI bleed.   He was seen in the [place] [facility] office on 12/31/2014 and is scheduled for an outpatient Endoscopy on February 16, 2015.   He was found to have a new pleural effusion on chest x ray and thoracentesis was done 2/1 by pulm.  Clinical Impression  Pt walked 150 feet with RW and 2L 02.  Pt felt good doing this.  Encouraged him to do multiple short walks.  pts O2 sats when  He first sat down was 92% but recovered to baseline of 98% within 10 seconds.  Will walk next time with no O2 and see how he tolerates and will have pt use cane - per his baseline.  Pt with no dizziness today    Follow Up Recommendations No PT follow up;Supervision/Assistance - 24 hour (initial 24/7 assist but anticipate pt back to normal soon - family agrees)    Equipment Recommendations  None recommended by PT    Recommendations for Other Services       Precautions / Restrictions Precautions Precautions: Fall Restrictions Weight Bearing Restrictions: No Other Position/Activity Restrictions: pt denied any dizziness in sitting.  pt with increased wheezing when supine but this resolved once sitting      Mobility  Bed Mobility Overal bed mobility: Modified Independent                Transfers Overall transfer level: Needs assistance Equipment used: Rolling walker (2 wheeled) Transfers: Sit to/from Stand Sit to Stand: Min guard            Ambulation/Gait Ambulation/Gait assistance: Min guard Ambulation Distance (Feet): 150 Feet Assistive device: Rolling walker (2 wheeled) Gait  Pattern/deviations: Step-through pattern     General Gait Details: pt needed max assist steering RW - pt blind and no functional vision noticed.  no loss of balance.  Pt did well and wanted to go further.  I talked wth family and staff about helping pt to get more walking in on non HD days  Stairs            Wheelchair Mobility    Modified Rankin (Stroke Patients Only)       Balance Overall balance assessment:  (pt denies history of falls)                                           Pertinent Vitals/Pain Pain Assessment: No/denies pain    Home Living   Living Arrangements:  (pt has 5 steps with rails - he says not hard for him)               Additional Comments: pt goes to HD M, W, and F on SCAT.  he takes cane for assistance.      Prior Function           Comments: wife does meal prep due to pt's vision deficits     Hand Dominance  Extremity/Trunk Assessment               Lower Extremity Assessment: Overall WFL for tasks assessed      Cervical / Trunk Assessment: Normal  Communication   Communication: No difficulties  Cognition Arousal/Alertness: Awake/alert (pt slow to respond) Behavior During Therapy: WFL for tasks assessed/performed                        General Comments      Exercises        Assessment/Plan    PT Assessment Patient needs continued PT services  PT Diagnosis Generalized weakness   PT Problem List Decreased activity tolerance  PT Treatment Interventions Gait training;Therapeutic activities;Patient/family education;Stair training   PT Goals (Current goals can be found in the Care Plan section) Acute Rehab PT Goals Patient Stated Goal: to get hsi strenght back PT Goal Formulation: With patient/family Time For Goal Achievement: 01/26/15 Potential to Achieve Goals: Good    Frequency Min 3X/week   Barriers to discharge        Co-evaluation               End of  Session Equipment Utilized During Treatment: Gait belt;Oxygen Activity Tolerance: Patient tolerated treatment well Patient left: in chair;with call bell/phone within reach;with family/visitor present Nurse Communication: Mobility status         Time: 1120-1200 PT Time Calculation (min) (ACUTE ONLY): 40 min   Charges:   PT Evaluation $Initial PT Evaluation Tier I: 1 Procedure PT Treatments $Gait Training: 23-37 mins   PT G Codes:        Loyal Buba 01/12/2015, 1:54 PM  Rande Lawman, PT

## 2015-01-12 NOTE — Procedures (Signed)
Late entry: Patient seen on dialysis. Hemodynamically stable.  Goal about 2 liters.  No dilysis issues. Kenzie Thoreson C

## 2015-01-12 NOTE — Care Management Note (Signed)
    Page 1 of 1   01/14/2015     4:36:47 PM CARE MANAGEMENT NOTE 01/14/2015  Patient:  OZRO, RUSSETT   Account Number:  [id number]  Date Initiated:  01/12/2015  Documentation initiated by:  Tomi Bamberger  Subjective/Objective Assessment:   dx gib  admit- lives with spouse.     Action/Plan:   pt eval- rec hhpt   Anticipated DC Date:  01/14/2015   Anticipated DC Plan:  [place]  CM consult      [facility] Choice  HOME HEALTH   Choice offered to / List presented to:  C-1 Patient        [place] arranged  [place] [facility]      [place].   Status of service:  Completed, signed off Medicare Important Message given?  YES (If response is "NO", the following Medicare IM given date fields will be blank) Date Medicare IM given:  01/12/2015 Medicare IM given by:  Tomi Bamberger Date Additional Medicare IM given:   Additional Medicare IM given by:    Discharge Disposition:  [place]  Per UR Regulation:  Reviewed for med. necessity/level of care/duration of stay  If discussed at [place] of Stay Meetings, dates discussed:    Comments:  01/14/15 [place], BSN [phone] patient for dc today, patient chose [facility] for HHPT, referral made to [facility] , [place] notified.  Soc will begin 24-48 hrs post dc.

## 2015-01-12 NOTE — Progress Notes (Signed)
   Name: Cameron Mendez MRN: [id number] DOB: 02/12/1946    ADMISSION DATE:  01/08/2015 CONSULTATION DATE:  2/1  REFERRING MD :  Eliseo Squires ([facility])   CHIEF COMPLAINT:  Pleural effusion   BRIEF PATIENT DESCRIPTION: 69yo male active smoker, legally blind with hx ESRD, HTN, dCHF, COPD sent from his usual HD to the ER 1/29 after his hemoglobin was found to be low.  Found to have heme POS stool and pancytopenia.   Admitted by [facility] with GI bleed.  CXR and CT chest revealed partially loculated R pleural effusion and PCCM consulted.   SIGNIFICANT EVENTS    STUDIES:  CT chest 1/30>>>mod R partially loculated pleural effusion with RML and RLL collapse.     SUBJECTIVE: Denies chest pain Dyspnea improved  VITAL SIGNS: Temp:  [97.7 F (36.5 C)-98.3 F (36.8 C)] 98 F (36.7 C) (02/02 1607) Pulse Rate:  [88-94] 94 (02/02 1607) Resp:  [16-18] 18 (02/02 1607) BP: (139-151)/(58-67) 149/67 mmHg (02/02 1607) SpO2:  [96 %-100 %] 100 % (02/02 1607)  PHYSICAL EXAMINATION: General:  Pleasant, thin male, NAD on HD  Neuro:  Awake, alert, appropriate, MAE  HEENT:  Eyes wandering r/t blindness, mm moist, no JVD  Cardiovascular:  s1s2 rrr Lungs:  resps even non labored on , diminished R, no wheeze Abdomen:  Round, soft, +bs  Musculoskeletal:  Warm and dry, no sig edema    Recent Labs Lab 01/09/15 0429 01/10/15 0345 01/11/15 1823  NA 136 134* 138  K 3.3* 3.5 5.7*  CL 97 97 101  CO2 34* 28 27  BUN 7 13 14   CREATININE 3.08* 3.95* 3.81*  GLUCOSE 85 89 101*    Recent Labs Lab 01/08/15 1753 01/08/15 2140 01/09/15 0429 01/10/15 0345  HGB 6.7* 7.2* 9.4* 9.6*  HCT 22.0* 23.8* 29.7* 31.0*  WBC 3.8*  --  4.1 3.6*  PLT 114*  --  106* 92*   Dg Chest Port 1 View  01/11/2015   CLINICAL DATA:  Right-sided pain following thoracentesis.  EXAM: PORTABLE CHEST - 1 VIEW  COMPARISON:  Radiograph 01/08/2015  FINDINGS: Ventriculostomy catheter noted along the right medial chest wall. Stable mildly enlarged  cardiac silhouette. There is interval decrease in the right pleural effusion following thoracentesis. Small amount of loculated effusion does remain at the right lung base. No evidence pneumothorax.  IMPRESSION: 1. No evidence of pneumothorax following right thoracentesis. 2. Decrease in volume of right pleural fluid with basilar loculated fluid remaining.   Electronically Signed   By: Suzy Bouchard M.D.   On: 01/11/2015 17:32    ASSESSMENT / PLAN:   Large R pleural effusion - exudative ,certainly some concern for malignancy given weight loss, extensive smoking hx.  Although he does have hx dCHF, doubt directly r/t volume overload as he is at dry weight, has not missed HD, does not appear volume overloaded otherwise.  Tobacco abuse  COPD +/- AECOPD  REC-  Await cytology  Cont duonebs  pulm hygiene  Supplemental O2 as needed to keep sats >90%  Change to oral steroids- rapid taper over 2 weeks   GI bleed  Anemia  ESRD  dCHF  Per IM   Kara Mead MD. FCCP. [facility] Pager [phone] If no response call [phone]    01/12/2015, 5:23 PM

## 2015-01-12 NOTE — Progress Notes (Signed)
PROGRESS NOTE  Cameron Mendez DGL:[id number] DOB: February 21, 1946 DOA: 01/08/2015 PCP: [name], MD  Cameron Mendez is a 69 y.o. male with a history of ESRD on HD MWF, Pancytopenia, HTN, Diastolic CHF, and COPD who was sent from dialysis to the ED after his hemoglobin was found to be low. In the ED on recheck his hemoglobin was found to be 6.7. He denies any ABD Pain or nausea or vomiting. His wife reports that he has had dark stools for several weeks, and she took a hemoccult to His PCP and 2 of 3 cards were positive and he was referred to [facility] GI Dr Deatra Ina. He was seen in the [place] [facility] office on 12/31/2014 and is scheduled for an outpatient Endoscopy on February 16, 2015. He was found to have a new pleural effusion on chest x ray and thoracentesis was done 2/1 by pulm.  Assessment/Plan: Slow GI bleeding- Hb= 6.7, and FOBT= HEME positive IV Protonix Transfused 2 units pRBCs  -has appointment with GI in March for scope   -monitor for bleeding   -h/h stable  Pleural effusion -CT scan of chest shows: Moderate size right pleural effusion with a partially loculated component. Right middle and lower lobe collapse -s/p thoracentesis- labs sent-- appears exudative -pulm consult- appreciate help  Other pancytopenia Had Bone Marrow Bx in 08/2014 by IR showed pancytopenia  Essential hypertension, benign Resume Carvedilol,and Amlodipine as BP and HR tolerate Monitor BPs  COPD exacerbation Continue DUONebs q 4 hrs PRN   -solumedrol   Chronic diastolic CHF (congestive heart failure) Monitor for Signs of Fluid Overload   ESRD (end stage renal disease) on dialysis- on Hemodialysis MWF renal  consult  Tobacco abuse -encourage cessation  Code Status: full Family Communication: patient/wife on phone 1/31 Disposition Plan:    Consultants:  Renal  pulm  Procedures:  thoracentesis    HPI/Subjective: hungry  Objective: Filed Vitals:   01/12/15 0808  BP: 139/58  Pulse: 88  Temp: 98 F (36.7 C)  Resp: 18    Intake/Output Summary (Last 24 hours) at 01/12/15 0842 Last data filed at 01/12/15 0300  Gross per 24 hour  Intake    400 ml  Output   2263 ml  Net  -1863 ml   Filed Weights   01/08/15 2111 01/11/15 0700 01/11/15 1104  Weight: 59.376 kg (130 lb 14.4 oz) 60.5 kg (133 lb 6.1 oz) 57.6 kg (126 lb 15.8 oz)    Exam:   General:  Awake- no overnight events  Cardiovascular: rrr  Respiratory: no wheezing  Abdomen: +Bs, soft  Musculoskeletal: no edema   Data Reviewed: Basic Metabolic Panel:  Recent Labs Lab 01/08/15 1753 01/08/15 2140 01/09/15 0429 01/10/15 0345 01/11/15 1823  NA 138 139 136 134* 138  K 3.1* 3.2* 3.3* 3.5 5.7*  CL 96 95* 97 97 101  CO2 36* 33* 34* 28 27  GLUCOSE 89 106* 85 89 101*  BUN <5* <5* 7 13 14   CREATININE 1.90* 2.32* 3.08* 3.95* 3.81*  CALCIUM 8.2* 8.6 8.1* 8.2* 8.9   Liver Function Tests:  Recent Labs Lab 01/08/15 2140 01/11/15 1823  AST 28 29  ALT 11 13  ALKPHOS 69 76  BILITOT 0.6 0.5  PROT 8.3 7.9  ALBUMIN 3.1* 3.2*   No results for input(s): LIPASE, AMYLASE in the last 168 hours. No results for input(s): AMMONIA in the last 168 hours. CBC:  Recent Labs Lab 01/08/15 1753 01/08/15 2140 01/09/15 0429 01/10/15 0345  WBC 3.8*  --  4.1 3.6*  NEUTROABS 2.9  --   --   --   HGB 6.7* 7.2* 9.4* 9.6*  HCT 22.0* 23.8* 29.7* 31.0*  MCV 92.4  --  89.7 90.9  PLT 114*  --  106* 92*   Cardiac Enzymes: No results for input(s): CKTOTAL, CKMB, CKMBINDEX, TROPONINI in the last 168 hours. BNP (last 3 results)  Recent Labs  04/27/14 0600  PROBNP 8130.0*   CBG: No results for  input(s): GLUCAP in the last 168 hours.  Recent Results (from the past 240 hour(s))  Body fluid culture     Status: None (Preliminary result)   Collection Time: 01/11/15  5:30 PM  Result Value Ref Range Status   Specimen Description PLEURAL FLUID RIGHT  Final   Special Requests NONE  Final   Gram Stain   Final    RARE WBC PRESENT, PREDOMINANTLY MONONUCLEAR NO ORGANISMS SEEN Performed at [facility]    Culture NO GROWTH Performed at [facility]   Final   Report Status PENDING  Incomplete     Studies: Dg Chest Port 1 View  01/11/2015   CLINICAL DATA:  Right-sided pain following thoracentesis.  EXAM: PORTABLE CHEST - 1 VIEW  COMPARISON:  Radiograph 01/08/2015  FINDINGS: Ventriculostomy catheter noted along the right medial chest wall. Stable mildly enlarged cardiac silhouette. There is interval decrease in the right pleural effusion following thoracentesis. Small amount of loculated effusion does remain at the right lung base. No evidence pneumothorax.  IMPRESSION: 1. No evidence of pneumothorax following right thoracentesis. 2. Decrease in volume of right pleural fluid with basilar loculated fluid remaining.   Electronically Signed   By: Suzy Bouchard M.D.   On: 01/11/2015 17:32    Scheduled Meds: . allopurinol  100 mg Oral Daily  . antiseptic oral rinse  7 mL Mouth Rinse BID  . methylPREDNISolone (SOLU-MEDROL) injection  60 mg Intravenous Q12H  . pantoprazole (PROTONIX) IV  40 mg Intravenous Q12H  . sodium chloride  3 mL Intravenous Q12H  . sodium chloride  3 mL Intravenous Q12H   Continuous Infusions:  Antibiotics Given (last 72 hours)    None      Active Problems:   Essential hypertension, benign   COPD (chronic obstructive pulmonary disease)   Other pancytopenia   GI bleeding   Chronic diastolic CHF (congestive heart failure)   ESRD (end stage renal disease) on dialysis   GI bleed   Pleural effusion   Tobacco abuse    Time spent: 25  min    Chrissie Dacquisto, [place] Pager [phone]. If 7PM-7AM, please contact night-coverage at [email or web address], password [facility] 01/12/2015, 8:42 AM  LOS: 4 days

## 2015-01-13 DIAGNOSIS — I5032 Chronic diastolic (congestive) heart failure: Secondary | ICD-10-CM

## 2015-01-13 LAB — CBC
HCT: 29.2 % — ABNORMAL LOW (ref 39.0–52.0)
HEMOGLOBIN: 9.1 g/dL — AB (ref 13.0–17.0)
MCH: 28.6 pg (ref 26.0–34.0)
MCHC: 31.2 g/dL (ref 30.0–36.0)
MCV: 91.8 fL (ref 78.0–100.0)
Platelets: 95 10*3/uL — ABNORMAL LOW (ref 150–400)
RBC: 3.18 MIL/uL — AB (ref 4.22–5.81)
RDW: 15.9 % — ABNORMAL HIGH (ref 11.5–15.5)
WBC: 5.8 10*3/uL (ref 4.0–10.5)

## 2015-01-13 LAB — BASIC METABOLIC PANEL
Anion gap: 9 (ref 5–15)
BUN: 55 mg/dL — AB (ref 6–23)
CHLORIDE: 100 mmol/L (ref 96–112)
CO2: 26 mmol/L (ref 19–32)
Calcium: 8.4 mg/dL (ref 8.4–10.5)
Creatinine, Ser: 5.51 mg/dL — ABNORMAL HIGH (ref 0.50–1.35)
GFR calc Af Amer: 11 mL/min — ABNORMAL LOW (ref >90)
GFR, EST NON AFRICAN AMERICAN: 10 mL/min — AB (ref >90)
Glucose, Bld: 106 mg/dL — ABNORMAL HIGH (ref 70–99)
Potassium: 4.2 mmol/L (ref 3.5–5.1)
Sodium: 135 mmol/L (ref 135–145)

## 2015-01-13 LAB — GLUCOSE, CAPILLARY: GLUCOSE-CAPILLARY: 114 mg/dL — AB (ref 70–99)

## 2015-01-13 MED ORDER — NEPRO/CARBSTEADY PO LIQD: 237.0000 mL | ORAL | Status: DC | PRN

## 2015-01-13 MED ORDER — PREDNISONE 50 MG PO TABS
50.0000 mg | ORAL_TABLET | Freq: Every day | ORAL | Status: DC
  Administered 2015-01-14: 50 mg via ORAL
  Filled 2015-01-13 (×2): qty 1

## 2015-01-13 MED ORDER — DARBEPOETIN ALFA 200 MCG/0.4ML IJ SOSY
PREFILLED_SYRINGE | INTRAMUSCULAR | Status: AC
  Administered 2015-01-13: 200 ug via INTRAVENOUS
  Filled 2015-01-13: qty 0.4

## 2015-01-13 MED ORDER — DARBEPOETIN ALFA 200 MCG/0.4ML IJ SOSY
200.0000 ug | PREFILLED_SYRINGE | INTRAMUSCULAR | Status: DC
  Administered 2015-01-13: 200 ug via INTRAVENOUS

## 2015-01-13 MED ORDER — SODIUM CHLORIDE 0.9 % IV SOLN: 100.0000 mL | INTRAVENOUS | Status: DC | PRN

## 2015-01-13 MED ORDER — SODIUM CHLORIDE 0.9 % IV SOLN
125.0000 mg | INTRAVENOUS | Status: DC
  Administered 2015-01-13: 125 mg via INTRAVENOUS
  Filled 2015-01-13: qty 10

## 2015-01-13 MED ORDER — LIDOCAINE HCL (PF) 1 % IJ SOLN: 5.0000 mL | INTRAMUSCULAR | Status: DC | PRN

## 2015-01-13 MED ORDER — LIDOCAINE-PRILOCAINE 2.5-2.5 % EX CREA
1.0000 "application " | TOPICAL_CREAM | CUTANEOUS | Status: DC | PRN
  Filled 2015-01-13: qty 5

## 2015-01-13 MED ORDER — DOXERCALCIFEROL 4 MCG/2ML IV SOLN
2.0000 ug | INTRAVENOUS | Status: DC
  Administered 2015-01-13: 2 ug via INTRAVENOUS

## 2015-01-13 MED ORDER — PENTAFLUOROPROP-TETRAFLUOROETH EX AERO: 1.0000 "application " | INHALATION_SPRAY | CUTANEOUS | Status: DC | PRN

## 2015-01-13 MED ORDER — DOXERCALCIFEROL 4 MCG/2ML IV SOLN
INTRAVENOUS | Status: AC
  Administered 2015-01-13: 2 ug via INTRAVENOUS
  Filled 2015-01-13: qty 2

## 2015-01-13 NOTE — Progress Notes (Signed)
[facility] Progress Note  Assessment/Plan: 1. Right pleural effusion s/p 1.4 L thora, exudative , cytology pending follwed by pul as an outpt for COPD/ last seen 1/5 2. Slow GIB - transfused 2 units prior to adm- had been declining from 8s in late Dec to 7s in Jan + FOBT - had been referred to GI. Tsats low despite IV Fe repeletion - ferritin 1200 - GI saw 1/21 - outpt endo scheduled for 3/6  3. ESRD - MWF - net UF 2.2 2/1 with post weight of 57.6 - below prior edw - no heparin HD 4. Anemia - Hgb 6.7 1/29 - transfused 2 units - up to 9.6 1/31check CBC pre HD today - weekly Aranesp 5. Secondary hyperparathyroidism - resume Hectorol - if P up can resume phoslo 1 ac tid 6. HTN/volume - BP still up some - continue to lower volume as tolerated; resume BP meds - norvasc 10 and coreg 25 bid. 7. Nutrition - has been losing weight, alb 3.2 - add suppl, vitamin- high risk for occult cancer 8. Chronic Thrombocytopenia - platelets actually higher than usual - had neg BM bx in Oct for pancytopenia  Myriam Jacobson, PA-C [place] [phone] 01/13/2015,10:40 AM  LOS: 5 days   Renal Attending: Agree with note above. He is receiving dialysis without heparin due to GIB.  Hemoglobin has stabilized. Alianna Wurster C  Subjective:   No c/o  Objective Filed Vitals:   01/13/15 0159 01/13/15 0557 01/13/15 0831 01/13/15 1015  BP: 121/55 142/57 141/61 133/55  Pulse: 79 80 71   Temp: 98.3 F (36.8 C) 97.9 F (36.6 C) 98 F (36.7 C)   TempSrc: Oral Oral Oral   Resp: 15 18 18    Height:      Weight:      SpO2: 94% 98% 100% 76%   Physical Exam General: NAD Heart: RRR 2/6 murmur Lungs: soft exp wheeze on left Abdomen: soft somewhat distended Extremities: tr LE brawny edema Dialysis Access: left upper AVF  Dialysis Orders: HD: [place] MWF4h 400/A1.5 60.5kg- last iven 1/27 2/2.25 [place] Heparin none LUA AVF Aranesp 200/ wk, Hectorol 2 ug TIW - tsat 16% ferritin  1200 - needs more venofer due to chronic GI loss  Additional Objective Labs: Basic Metabolic Panel:  Recent Labs Lab 01/09/15 0429 01/10/15 0345 01/11/15 1823  NA 136 134* 138  K 3.3* 3.5 5.7*  CL 97 97 101  CO2 34* 28 27  GLUCOSE 85 89 101*  BUN 7 13 14   CREATININE 3.08* 3.95* 3.81*  CALCIUM 8.1* 8.2* 8.9   Liver Function Tests:  Recent Labs Lab 01/08/15 2140 01/11/15 1823  AST 28 29  ALT 11 13  ALKPHOS 69 76  BILITOT 0.6 0.5  PROT 8.3 7.9  ALBUMIN 3.1* 3.2*   CBC:  Recent Labs Lab 01/08/15 1753 01/08/15 2140 01/09/15 0429 01/10/15 0345  WBC 3.8*  --  4.1 3.6*  NEUTROABS 2.9  --   --   --   HGB 6.7* 7.2* 9.4* 9.6*  HCT 22.0* 23.8* 29.7* 31.0*  MCV 92.4  --  89.7 90.9  PLT 114*  --  106* 92*   Blood Culture    Component Value Date/Time   SDES PLEURAL FLUID RIGHT 01/11/2015 1730   SPECREQUEST NONE 01/11/2015 1730   CULT NO GROWTH Performed at [facility]  01/11/2015 1730   REPTSTATUS PENDING 01/11/2015 1730    Cardiac Enzymes: No results for input(s): CKTOTAL, CKMB, CKMBINDEX, TROPONINI in the  last 168 hours. CBG: No results for input(s): GLUCAP in the last 168 hours. Iron Studies: No results for input(s): IRON, TIBC, TRANSFERRIN, FERRITIN in the last 72 hours. @lablastinr3 @ Studies/Results: Dg Chest Port 1 View  01/11/2015   CLINICAL DATA:  Right-sided pain following thoracentesis.  EXAM: PORTABLE CHEST - 1 VIEW  COMPARISON:  Radiograph 01/08/2015  FINDINGS: Ventriculostomy catheter noted along the right medial chest wall. Stable mildly enlarged cardiac silhouette. There is interval decrease in the right pleural effusion following thoracentesis. Small amount of loculated effusion does remain at the right lung base. No evidence pneumothorax.  IMPRESSION: 1. No evidence of pneumothorax following right thoracentesis. 2. Decrease in volume of right pleural fluid with basilar loculated fluid remaining.   Electronically Signed   By: Suzy Bouchard M.D.   On: 01/11/2015 17:32   Medications:   . allopurinol  100 mg Oral Daily  . amLODipine  10 mg Oral QHS  . antiseptic oral rinse  7 mL Mouth Rinse BID  . carvedilol  25 mg Oral BID WC  . feeding supplement (NEPRO CARB STEADY)  237 mL Oral BID BM  . methylPREDNISolone (SOLU-MEDROL) injection  40 mg Intravenous Daily  . multivitamin  1 tablet Oral QHS  . pantoprazole (PROTONIX) IV  40 mg Intravenous Q12H  . sodium chloride  3 mL Intravenous Q12H  . sodium chloride  3 mL Intravenous Q12H

## 2015-01-13 NOTE — Progress Notes (Signed)
[facility] PROGRESS NOTE  ATHENS LEBEAU YTK:[id number] DOB: [birth year] DOA: 01/08/2015 PCP: [name], MD  Assessment/Plan: 1-Pleural effusion, exudate;  CT scan of chest shows: Moderate size right pleural effusion with a partially loculated component. Right middle and lower lobe collapse. Cytology no malignant cell.  Not on antibiotics.  Awaiting pulmonologist evaluation. Does patient need to be treated with antibiotics for exudative pleural effusion, partially loculated effusion ?   2-GI bleed, anemia;  Received 2 units of PRBC during this admission.  HB has remain stable.  Need To follow up with GI in march for scope.   ESRD; on dialysis. Nephrologist following.   Chronic Diastolic HF; compensated.   Pancytopenia; stable. Follow trend.   HTN; continue with coreg, amlodipine.   COPD; Taper steroids. Nebulizer treatment.   Code Status: Full Code.  Family Communication: Care discussed with patient.  Disposition Plan: awaiting pulmonologist recommendation.    Consultants:  Nephrologist.  Pulmonologist  Procedures:  Dialysis.   Antibiotics:  none  HPI/Subjective: I saw patient during dialysis. He is sleepy but answer questions. Denies dyspnea, no chest pain.    Objective: Filed Vitals:   01/13/15 1430  BP: 124/59  Pulse: 60  Temp:   Resp:     Intake/Output Summary (Last 24 hours) at 01/13/15 1500 Last data filed at 01/13/15 0931  Gross per 24 hour  Intake    615 ml  Output    400 ml  Net    215 ml   Filed Weights   01/11/15 0700 01/11/15 1104 01/13/15 1114  Weight: 60.5 kg (133 lb 6.1 oz) 57.6 kg (126 lb 15.8 oz) 58 kg (127 lb 13.9 oz)    Exam:   General: No acute Distress.   Cardiovascular: S 1, S 2 RRR  Respiratory: Decrease breath sound, mild crackles bases.   Abdomen: Bs present, soft, nt  Musculoskeletal: no edema.   Data Reviewed: Basic Metabolic Panel:  Recent Labs Lab 01/08/15 2140 01/09/15 0429 01/10/15 0345  01/11/15 1823 01/13/15 0900  NA 139 136 134* 138 135  K 3.2* 3.3* 3.5 5.7* 4.2  CL 95* 97 97 101 100  CO2 33* 34* 28 27 26   GLUCOSE 106* 85 89 101* 106*  BUN <5* 7 13 14  55*  CREATININE 2.32* 3.08* 3.95* 3.81* 5.51*  CALCIUM 8.6 8.1* 8.2* 8.9 8.4   Liver Function Tests:  Recent Labs Lab 01/08/15 2140 01/11/15 1823  AST 28 29  ALT 11 13  ALKPHOS 69 76  BILITOT 0.6 0.5  PROT 8.3 7.9  ALBUMIN 3.1* 3.2*   No results for input(s): LIPASE, AMYLASE in the last 168 hours. No results for input(s): AMMONIA in the last 168 hours. CBC:  Recent Labs Lab 01/08/15 1753 01/08/15 2140 01/09/15 0429 01/10/15 0345 01/13/15 0900  WBC 3.8*  --  4.1 3.6* 5.8  NEUTROABS 2.9  --   --   --   --   HGB 6.7* 7.2* 9.4* 9.6* 9.1*  HCT 22.0* 23.8* 29.7* 31.0* 29.2*  MCV 92.4  --  89.7 90.9 91.8  PLT 114*  --  106* 92* 95*   Cardiac Enzymes: No results for input(s): CKTOTAL, CKMB, CKMBINDEX, TROPONINI in the last 168 hours. BNP (last 3 results) No results for input(s): BNP in the last 8760 hours.  ProBNP (last 3 results)  Recent Labs  04/27/14 0600  PROBNP 8130.0*    CBG: No results for input(s): GLUCAP in the last 168 hours.  Recent Results (from the past 240 hour(s))  Body fluid culture     Status: None (Preliminary result)   Collection Time: 01/11/15  5:30 PM  Result Value Ref Range Status   Specimen Description PLEURAL FLUID RIGHT  Final   Special Requests NONE  Final   Gram Stain   Final    RARE WBC PRESENT, PREDOMINANTLY MONONUCLEAR NO ORGANISMS SEEN Performed at [facility]    Culture   Final    NO GROWTH 1 DAY Performed at [facility]    Report Status PENDING  Incomplete     Studies: Dg Chest Port 1 View  01/11/2015   CLINICAL DATA:  Right-sided pain following thoracentesis.  EXAM: PORTABLE CHEST - 1 VIEW  COMPARISON:  Radiograph 01/08/2015  FINDINGS: Ventriculostomy catheter noted along the right medial chest wall. Stable mildly enlarged  cardiac silhouette. There is interval decrease in the right pleural effusion following thoracentesis. Small amount of loculated effusion does remain at the right lung base. No evidence pneumothorax.  IMPRESSION: 1. No evidence of pneumothorax following right thoracentesis. 2. Decrease in volume of right pleural fluid with basilar loculated fluid remaining.   Electronically Signed   By: Suzy Bouchard M.D.   On: 01/11/2015 17:32    Scheduled Meds: . allopurinol  100 mg Oral Daily  . amLODipine  10 mg Oral QHS  . antiseptic oral rinse  7 mL Mouth Rinse BID  . carvedilol  25 mg Oral BID WC  . darbepoetin (ARANESP) injection - DIALYSIS  200 mcg Intravenous Q Wed-HD  . doxercalciferol  2 mcg Intravenous Q M,W,F-HD  . feeding supplement (NEPRO CARB STEADY)  237 mL Oral BID BM  . ferric gluconate (FERRLECIT/NULECIT) IV  125 mg Intravenous Q M,W,F-HD  . methylPREDNISolone (SOLU-MEDROL) injection  40 mg Intravenous Daily  . multivitamin  1 tablet Oral QHS  . pantoprazole (PROTONIX) IV  40 mg Intravenous Q12H  . sodium chloride  3 mL Intravenous Q12H  . sodium chloride  3 mL Intravenous Q12H   Continuous Infusions:   Active Problems:   Essential hypertension, benign   COPD (chronic obstructive pulmonary disease)   Other pancytopenia   GI bleeding   Chronic diastolic CHF (congestive heart failure)   ESRD (end stage renal disease) on dialysis   GI bleed   Pleural effusion   Tobacco abuse    Time spent: 35 minutes.     Niel Hummer A  [facility] Pager [phone]. If 7PM-7AM, please contact night-coverage at [email or web address], password [facility] 01/13/2015, 3:00 PM  LOS: 5 days

## 2015-01-13 NOTE — Progress Notes (Signed)
PT Cancellation Note  Patient Details Name: Cameron Mendez MRN: [id number] DOB: 1946-11-06   Cancelled Treatment:    Reason Eval/Treat Not Completed: Patient at procedure or test/unavailable .  Pt has been taken to HD.  Will return to see pt as able. 01/13/2015  Donnella Sham, [place] [phone]  (pager)  Terresa Marlett, Tessie Fass 01/13/2015, 12:55 PM

## 2015-01-14 MED ORDER — PREDNISONE 20 MG PO TABS: ORAL_TABLET | ORAL | Status: DC

## 2015-01-14 MED ORDER — PANTOPRAZOLE SODIUM 40 MG PO TBEC: 40.0000 mg | DELAYED_RELEASE_TABLET | Freq: Two times a day (BID) | ORAL | Status: DC

## 2015-01-14 MED ORDER — PANTOPRAZOLE SODIUM 40 MG PO TBEC
40.0000 mg | DELAYED_RELEASE_TABLET | Freq: Two times a day (BID) | ORAL | Status: DC
  Administered 2015-01-14: 40 mg via ORAL
  Filled 2015-01-14: qty 1

## 2015-01-14 MED ORDER — NEPRO/CARBSTEADY PO LIQD: 237.0000 mL | Freq: Two times a day (BID) | ORAL | Status: DC

## 2015-01-14 MED ORDER — CARVEDILOL 25 MG PO TABS: 25.0000 mg | ORAL_TABLET | Freq: Two times a day (BID) | ORAL | Status: DC

## 2015-01-14 NOTE — Discharge Summary (Signed)
Physician Discharge Summary  Cameron Mendez MHD:[id number] DOB: 1946-10-01 DOA: 01/08/2015  PCP: [name], MD  Admit date: 01/08/2015 Discharge date: 01/14/2015  Time spent: 35 minutes  Recommendations for Outpatient Follow-up:  Needs to follow up with Pulmonologist, will need repeat Chest x ray, if pleural effusion reoccurs patient might need biopsy.  Needs to follow up with GI, for further evaluation of anemia. Resume aspirin depending on results.   Discharge Diagnoses:    Essential hypertension, benign   COPD (chronic obstructive pulmonary disease)   Other pancytopenia   GI bleeding   Chronic diastolic CHF (congestive heart failure)   ESRD (end stage renal disease) on dialysis   GI bleed   Pleural effusion   Tobacco abuse   Discharge Condition: stable.   Diet recommendation: heart Healthy  Filed Weights   01/11/15 1104 01/13/15 1114 01/13/15 1529  Weight: 57.6 kg (126 lb 15.8 oz) 58 kg (127 lb 13.9 oz) 56.6 kg (124 lb 12.5 oz)    History of present illness:  Cameron Mendez is a 69 y.o. male with a history of ESRD on HD MWF, Pancytopenia, HTN, Diastolic CHF, and COPD who was sent from dialysis to the ED after his hemoglobin was found to be low. In the ED on recheck his hemoglobin was found to be 6.7. He denies any ABD Pain or nausea or vomiting. His wife reports that he has had dark stools for several weeks, and she took a hemoccult to His PCP and 2 of 3 cards were positive and he was referred to [facility] GI Dr Deatra Ina. He was seen in the [place] [facility] office on 12/31/2014 and is scheduled for an outpatient Endoscopy on February 16, 2015.   Hospital Course:  1-Pleural effusion, exudate;  CT scan of chest shows: Moderate size right pleural effusion with a partially loculated component. Right middle and lower lobe collapse. Cytology no malignant cell. No bacteria growth pleural fluid.  Not on antibiotics.  Discussed with Dr Elsworth Soho, ok to discharge. No need to treat with  antibiotics. If effusion reoccurs patient will need biopsy.    2-GI bleed, anemia;  Received 2 units of PRBC during this admission.  HB has remain stable.  Need To follow up with GI in march for scope.   ESRD; on dialysis. Nephrologist following. Due for dialysis 2-11  Chronic Diastolic HF; compensated.   Pancytopenia; stable. Follow trend. Wbc increase.   HTN; continue with coreg, amlodipine.   COPD; Taper steroids. Nebulizer treatment.   Procedures:  Thoracentesis.   Dialysis.   Consultations:  Pulmonary  Nephrologist.   Discharge Exam: Filed Vitals:   01/14/15 0835  BP: 115/30  Pulse:   Temp:   Resp:     General: Alert in no distress.  Cardiovascular: S 1, S 2 RRR Respiratory: CTA  Discharge Instructions   Discharge Instructions    Diet - low sodium heart healthy    Complete by:  As directed      Increase activity slowly    Complete by:  As directed           Current Discharge Medication List    START taking these medications   Details  Nutritional Supplements (FEEDING SUPPLEMENT, NEPRO CARB STEADY,) LIQD Take 237 mLs by mouth 2 (two) times daily between meals. Qty: 30 Can, Refills: 0    predniSONE (DELTASONE) 20 MG tablet Take 3 tablets for 4 days then 2 tablets for 5 days then 1 tablet for 3 days then half tablet for one  day. Qty: 26 tablet, Refills: 0      CONTINUE these medications which have CHANGED   Details  carvedilol (COREG) 25 MG tablet Take 1 tablet (25 mg total) by mouth 2 (two) times daily with a meal. Qty: 60 tablet, Refills: 0    pantoprazole (PROTONIX) 40 MG tablet Take 1 tablet (40 mg total) by mouth 2 (two) times daily. Qty: 60 tablet, Refills: 0      CONTINUE these medications which have NOT CHANGED   Details  acetaminophen (TYLENOL) 500 MG tablet Take 1,000 mg by mouth every 6 (six) hours as needed for moderate pain.    allopurinol (ZYLOPRIM) 100 MG tablet Take 100 mg by mouth daily.     bisacodyl (DULCOLAX) 5 MG  EC tablet Take 5 mg by mouth daily as needed for moderate constipation.    clotrimazole-betamethasone (LOTRISONE) cream Apply 1 application topically daily.     colchicine 0.6 MG tablet Take 0.6 mg by mouth daily as needed (gout attacks).     diphenhydrAMINE (BENADRYL) 25 MG tablet Take 25 mg by mouth every 6 (six) hours as needed for itching or allergies (congestion).     guaiFENesin (MUCINEX) 600 MG 12 hr tablet Take 1 tablet (600 mg total) by mouth 2 (two) times daily.    ipratropium-albuterol (DUONEB) 0.5-2.5 (3) MG/3ML SOLN Take 3 mLs by nebulization every 4 (four) hours as needed. Qty: 360 mL, Refills: 2    Tiotropium Bromide Monohydrate (SPIRIVA RESPIMAT) 2.5 MCG/ACT AERS Inhale 2 puffs into the lungs every morning. Qty: 1 Inhaler, Refills: 6    darbepoetin (ARANESP) 200 MCG/0.4ML SOLN injection Inject 200 mcg into the vein every 7 (seven) days. Take on Mondays    doxercalciferol (HECTOROL) 4 MCG/2ML injection Inject 0.5 mLs (1 mcg total) into the vein every Monday, Wednesday, and Friday with hemodialysis. Qty: 2 mL    ferric gluconate 125 mg in sodium chloride 0.9 % 100 mL Inject 125 mg into the vein every Monday, Wednesday, and Friday with hemodialysis.    MOVIPREP 100 G SOLR Take 1 kit (200 g total) by mouth as directed. Qty: 1 kit, Refills: 0   Associated Diagnoses: Heme + stool; Encounter for colonoscopy due to history of adenomatous colonic polyps    multivitamin (RENA-VIT) TABS tablet Take 1 tablet by mouth at bedtime. Qty: 30 tablet, Refills: 0      STOP taking these medications     amLODipine (NORVASC) 10 MG tablet      aspirin EC 81 MG tablet        Allergies  Allergen Reactions  . Ibuprofen Other (See Comments)    Kidney problems-dialysis patient  . Nsaids Other (See Comments)    Kidney problems-dialysis patient   Follow-up Information    Follow up with HILL,GERALD K, MD In 1 week.   Specialty:  Family Medicine   Contact information:   [address] [place] [address] [phone]       Follow up with Rigoberto Noel., MD In 1 week.   Specialty:  Pulmonary Disease   Contact information:   [address] [address] [phone]        The results of significant diagnostics from this hospitalization (including imaging, microbiology, ancillary and laboratory) are listed below for reference.    Significant Diagnostic Studies: Dg Chest 2 View  01/08/2015   CLINICAL DATA:  Shortness of breath.  EXAM: CHEST  2 VIEW  COMPARISON:  September 14, 2014  FINDINGS: There  is a sizable right pleural effusion. There is a degree of consolidation throughout much of the right middle and lower lobes. There is some patchy opacity in the left upper lobe posteriorly as well. The left lung is clear. Heart size is upper normal and pulmonary vascularity appears normal. No adenopathy. There is a shunt catheter along the right hemithorax. A fragment of a second catheter is noted in the right lower hemithorax extending into the abdomen. There is no demonstrable adenopathy. No bone lesions.  IMPRESSION: Sizable right effusion with consolidation/ atelectatic change an much of the right lung. Left lung clear. Heart size upper normal. Fragment of a shunt catheter is noted in the right hemithorax as well as an apparent patent catheter on the right.   Electronically Signed   By: Lowella Grip M.D.   On: 01/08/2015 20:40   Ct Chest W Contrast  01/10/2015   CLINICAL DATA:  Pleural effusion, history of smoking  EXAM: CT CHEST WITH CONTRAST  TECHNIQUE: Multidetector CT imaging of the chest was performed during intravenous contrast administration.  CONTRAST:  64mL OMNIPAQUE IOHEXOL 300 MG/ML  SOLN  COMPARISON:  None.  FINDINGS: The central airways are patent. There is a moderate size right pleural effusion with a partially loculated component. There is right middle and lower lobe collapse. There is a trace left pleural effusion. There is no pleural effusion  or pneumothorax.  There are no pathologically enlarged axillary, hilar or mediastinal lymph nodes.  The heart size is normal. There is no pericardial effusion. The thoracic aorta is normal in caliber. There is thoracic aortic atherosclerosis. There is coronary artery atherosclerosis of the left main and LAD.  Review of bone windows demonstrates no focal lytic or sclerotic lesions. There is a ventriculoperitoneal shunt catheter tubing noted.  Limited non-contrast images of the upper abdomen were obtained. The adrenal glands appear normal. There are subtle hypodense masses within the spleen with the largest measuring approximately 2.1 cm.  IMPRESSION: 1. Moderate size right pleural effusion with a partially loculated component. Right middle and lower lobe collapse. 2. Multiple hypodense splenic mass are of indeterminate etiology. There are at least 2 masses seen on a prior CT of the abdomen dated 05/18/2009.   Electronically Signed   By: Kathreen Devoid   On: 01/10/2015 00:07   Dg Chest Port 1 View  01/11/2015   CLINICAL DATA:  Right-sided pain following thoracentesis.  EXAM: PORTABLE CHEST - 1 VIEW  COMPARISON:  Radiograph 01/08/2015  FINDINGS: Ventriculostomy catheter noted along the right medial chest wall. Stable mildly enlarged cardiac silhouette. There is interval decrease in the right pleural effusion following thoracentesis. Small amount of loculated effusion does remain at the right lung base. No evidence pneumothorax.  IMPRESSION: 1. No evidence of pneumothorax following right thoracentesis. 2. Decrease in volume of right pleural fluid with basilar loculated fluid remaining.   Electronically Signed   By: Suzy Bouchard M.D.   On: 01/11/2015 17:32    Microbiology: Recent Results (from the past 240 hour(s))  Body fluid culture     Status: None (Preliminary result)   Collection Time: 01/11/15  5:30 PM  Result Value Ref Range Status   Specimen Description PLEURAL FLUID RIGHT  Final   Special Requests  NONE  Final   Gram Stain   Final    RARE WBC PRESENT, PREDOMINANTLY MONONUCLEAR NO ORGANISMS SEEN Performed at [facility]    Culture   Final    NO GROWTH 1 DAY Performed at [facility]    Report Status PENDING  Incomplete     Labs: Basic Metabolic Panel:  Recent Labs Lab 01/08/15 2140 01/09/15 0429 01/10/15 0345 01/11/15 1823 01/13/15 0900  NA 139 136 134* 138 135  K 3.2* 3.3* 3.5 5.7* 4.2  CL 95* 97 97 101 100  CO2 33* 34* _0 GLUCOSE 106* 85 89 101* 106*  BUN <5* _1 55*  CREATININE 2.32* 3.08* 3.95* 3.81* 5.51*  CALCIUM 8.6 8.1* 8.2* 8.9 8.4   Liver Function Tests:  Recent Labs Lab 01/08/15 2140 01/11/15 1823  AST 28 29  ALT 11 13  ALKPHOS 69 76  BILITOT 0.6 0.5  PROT 8.3 7.9  ALBUMIN 3.1* 3.2*   No results for input(s): LIPASE, AMYLASE in the last 168 hours. No results for input(s): AMMONIA in the last 168 hours. CBC:  Recent Labs Lab 01/08/15 1753 01/08/15 2140 01/09/15 0429 01/10/15 0345 01/13/15 0900  WBC 3.8*  --  4.1 3.6* 5.8  NEUTROABS 2.9  --   --   --   --   HGB 6.7* 7.2* 9.4* 9.6* 9.1*  HCT 22.0* 23.8* 29.7* 31.0* 29.2*  MCV 92.4  --  89.7 90.9 91.8  PLT 114*  --  106* 92* 95*   Cardiac Enzymes: No results for input(s): CKTOTAL, CKMB, CKMBINDEX, TROPONINI in the last 168 hours. BNP: BNP (last 3 results) No results for input(s): BNP in the last 8760 hours.  ProBNP (last 3 results)  Recent Labs  04/27/14 0600  PROBNP 8130.0*    CBG:  Recent Labs Lab 01/13/15 1733  GLUCAP 114*       Signed:  Kyzer Blowe A  [facility] 01/14/2015, 10:07 AM

## 2015-01-14 NOTE — Progress Notes (Signed)
Cameron Mendez to be D/C'd Home per MD order.  Discussed with the patient and all questions fully answered.    Medication List    STOP taking these medications        amLODipine 10 MG tablet  Commonly known as:  NORVASC     aspirin EC 81 MG tablet      TAKE these medications        acetaminophen 500 MG tablet  Commonly known as:  TYLENOL  Take 1,000 mg by mouth every 6 (six) hours as needed for moderate pain.     allopurinol 100 MG tablet  Commonly known as:  ZYLOPRIM  Take 100 mg by mouth daily.     bisacodyl 5 MG EC tablet  Commonly known as:  DULCOLAX  Take 5 mg by mouth daily as needed for moderate constipation.     carvedilol 25 MG tablet  Commonly known as:  COREG  Take 1 tablet (25 mg total) by mouth 2 (two) times daily with a meal.     clotrimazole-betamethasone cream  Commonly known as:  LOTRISONE  Apply 1 application topically daily.     colchicine 0.6 MG tablet  Take 0.6 mg by mouth daily as needed (gout attacks).     darbepoetin 200 MCG/0.4ML Soln injection  Commonly known as:  ARANESP  Inject 200 mcg into the vein every 7 (seven) days. Take on Mondays     diphenhydrAMINE 25 MG tablet  Commonly known as:  BENADRYL  Take 25 mg by mouth every 6 (six) hours as needed for itching or allergies (congestion).     doxercalciferol 4 MCG/2ML injection  Commonly known as:  HECTOROL  Inject 0.5 mLs (1 mcg total) into the vein every Monday, Wednesday, and Friday with hemodialysis.     feeding supplement (NEPRO CARB STEADY) Liqd  Take 237 mLs by mouth 2 (two) times daily between meals.     ferric gluconate 125 mg in sodium chloride 0.9 % 100 mL  Inject 125 mg into the vein every Monday, Wednesday, and Friday with hemodialysis.     guaiFENesin 600 MG 12 hr tablet  Commonly known as:  MUCINEX  Take 1 tablet (600 mg total) by mouth 2 (two) times daily.     ipratropium-albuterol 0.5-2.5 (3) MG/3ML Soln  Commonly known as:  DUONEB  Take 3 mLs by nebulization  every 4 (four) hours as needed.     MOVIPREP 100 G Solr  Generic drug:  peg 3350 powder  Take 1 kit (200 g total) by mouth as directed.     multivitamin Tabs tablet  Take 1 tablet by mouth at bedtime.     pantoprazole 40 MG tablet  Commonly known as:  PROTONIX  Take 1 tablet (40 mg total) by mouth 2 (two) times daily.     predniSONE 20 MG tablet  Commonly known as:  DELTASONE  Take 3 tablets for 4 days then 2 tablets for 5 days then 1 tablet for 3 days then half tablet for one day.  Notes to Patient:  DO NOT abruptly stop taking medication.      Tiotropium Bromide Monohydrate 2.5 MCG/ACT Aers  Commonly known as:  SPIRIVA RESPIMAT  Inhale 2 puffs into the lungs every morning.        VVS, Skin clean, dry and intact without evidence of skin break down, no evidence of skin tears noted. IV catheter discontinued intact. Site without signs and symptoms of complications. Dressing and pressure applied.  An After  Visit Summary was printed and given to the patient.  D/c education completed with patient/family including follow up instructions, medication list, d/c activities limitations if indicated, with other d/c instructions as indicated by MD - patient able to verbalize understanding, all questions fully answered.   Patient instructed to return to ED, call 911, or call MD for any changes in condition.   Patient escorted via [place], and D/C home via private auto.  Delman Cheadle 01/14/2015 1:27 PM

## 2015-01-14 NOTE — Progress Notes (Signed)
Dr. Tyrell Antonio notified about BP 115/30 checked manually to clarify if okay to give or hold scheduled coreg this morning. Telephone order received to hold this dose of coreg.

## 2015-01-14 NOTE — Progress Notes (Signed)
[facility] Progress Note  Assessment/Plan: 1. Right pleural effusion s/p 1.4 L thora, exudative , neg malig on cytology.  2. Slow GIB - transfused 2 units prior to adm- had been declining from 8s in late Dec to 7s in Jan + FOBT - had been referred to GI. Tsats low despite IV Fe repeletion - ferritin 1200 - GI saw 1/21 - outpt endo scheduled for 3/6  3. ESRD - MWF - net UF 2.2 2/1 with post weight of 57.6 - below prior edw - no heparin HD ongoing 4. Anemia - Hgb 6.7 1/29 - transfused 2 units - up to 9.6 1/31check equilibrated to 9.1- weekly Aranesp 200 given 2/3 5. Secondary hyperparathyroidism - continueHectorol -off binders at present - will check outpt labs and see if he needs to resume  6. HTN/volume - BP improved with resuming BP meds - norvasc 10 and coreg 25 bid; net UF 1.5 on Wed with post weight of 56.6 - sig below prior edw - new EDW for d/c 7. Nutrition - has been losing weight, alb 3.2 - add suppl, vitamin- have outpt RD talk with pt/wife to augment intake 8. Chronic Thrombocytopenia - platelets actually higher than usual - had neg BM bx in Oct for pancytopenia 9. Disp - d/c today - follow up with outpt HD tomorrow - continue no heparin HD Myriam Jacobson, PA-C [place] [phone] 01/14/2015,10:45 AM  LOS: 6 days    Renal Attending: Agree with note as articulated above and see no contraindication to discharge today. Eneida Evers C   Subjective:   No c/o   Objective Filed Vitals:   01/13/15 2146 01/14/15 0021 01/14/15 0833 01/14/15 0835  BP:  129/43 131/35 115/30  Pulse: 71 69 63   Temp: 98.7 F (37.1 C) 98.9 F (37.2 C)    TempSrc: Oral Oral    Resp: 18 18    Height:      Weight:      SpO2: 100% 100% 98%    Physical Exam General:NAD sitting on side of the bed Heart: RRR with ectopy Lungs: left clear right base dim Abdomen: soft NT Extremities: no edema Dialysis Access: left upper AVF + bruit  Dialysis Orders: HD:  [place] MWF4h 400/A1.5 60.5kg-  2/2.25 [place] Heparin none LUA AVF Aranesp 200/ wk, Hectorol 2 ug TIW - tsat 16% ferritin 1200 - needs more venofer due to chronic GI loss  Additional Objective Labs: Basic Metabolic Panel:  Recent Labs Lab 01/10/15 0345 01/11/15 1823 01/13/15 0900  NA 134* 138 135  K 3.5 5.7* 4.2  CL 97 101 100  CO2 28 27 26   GLUCOSE 89 101* 106*  BUN 13 14 55*  CREATININE 3.95* 3.81* 5.51*  CALCIUM 8.2* 8.9 8.4   Liver Function Tests:  Recent Labs Lab 01/08/15 2140 01/11/15 1823  AST 28 29  ALT 11 13  ALKPHOS 69 76  BILITOT 0.6 0.5  PROT 8.3 7.9  ALBUMIN 3.1* 3.2*  CBC:  Recent Labs Lab 01/08/15 1753  01/09/15 0429 01/10/15 0345 01/13/15 0900  WBC 3.8*  --  4.1 3.6* 5.8  NEUTROABS 2.9  --   --   --   --   HGB 6.7*  < > 9.4* 9.6* 9.1*  HCT 22.0*  < > 29.7* 31.0* 29.2*  MCV 92.4  --  89.7 90.9 91.8  PLT 114*  --  106* 92* 95*  < > = values in this interval not displayed. Blood Culture    Component  Value Date/Time   SDES PLEURAL FLUID RIGHT 01/11/2015 1730   SPECREQUEST NONE 01/11/2015 1730   CULT  01/11/2015 1730    NO GROWTH 1 DAY Performed at [place] PENDING 01/11/2015 1730    CBG:  Recent Labs Lab 01/13/15 1733  GLUCAP 114*   Medications:   . allopurinol  100 mg Oral Daily  . amLODipine  10 mg Oral QHS  . antiseptic oral rinse  7 mL Mouth Rinse BID  . carvedilol  25 mg Oral BID WC  . feeding supplement (NEPRO CARB STEADY)  237 mL Oral BID BM  . multivitamin  1 tablet Oral QHS  . pantoprazole  40 mg Oral BID  . predniSONE  50 mg Oral Q breakfast  . sodium chloride  3 mL Intravenous Q12H  . sodium chloride  3 mL Intravenous Q12H

## 2015-01-14 NOTE — Progress Notes (Signed)
Physical Therapy Treatment Patient Details Name: Cameron Mendez MRN: [id number] DOB: 1946-08-21 Today's Date: 01/14/2015    History of Present Illness Cameron Mendez is a 69 y.o. male with a history of ESRD on HD MWF, Pancytopenia, HTN, Diastolic CHF, and COPD who was sent from dialysis to the ED after his hemoglobin was found to be low.  In the ED on recheck his hemoglobin was found to be 6.7.  Pt found to have slow GI bleed.   He was seen in the [facility] office on 12/31/2014 and is scheduled for an outpatient Endoscopy on February 16, 2015.   He was found to have a new pleural effusion on chest x ray and thoracentesis was done 2/1 by pulm.    PT Comments    Pt admitted with above diagnosis. Pt currently with functional limitations due to decr safety due to being in unfamiliar environment and slight weakness.  Pt will need 24 hour assist initially as well as PT recommends HHPT safety evaluation.  Has equipment.  No O2 needed as pt did not desat below 91%.  Pt will benefit from skilled PT to increase their independence and safety with mobility to allow discharge to the venue listed below.    Follow Up Recommendations  Home health PT;Supervision/Assistance - 24 hour (safety eval recommended- pt to have 24 hour care initially)     Equipment Recommendations  None recommended by PT    Recommendations for Other Services       Precautions / Restrictions Precautions Precautions: Fall Restrictions Weight Bearing Restrictions: No    Mobility  Bed Mobility Overal bed mobility: Modified Independent                Transfers Overall transfer level: Needs assistance Equipment used: Rolling walker (2 wheeled) Transfers: Sit to/from Stand Sit to Stand: Min guard            Ambulation/Gait Ambulation/Gait assistance: Min guard Ambulation Distance (Feet): 180 Feet Assistive device: Rolling walker (2 wheeled) Gait Pattern/deviations: Step-through pattern;Decreased stride length    Gait velocity interpretation: <1.8 ft/sec, indicative of risk for recurrent falls General Gait Details: pt needed max assist steering RW - pt blind and no functional vision noticed.  no loss of balance.  Pt states he knows how to negotiate home easily.  When returned to room, pt stated he needed to go to bathroom therefore assisted pt to 3N1 and NT came in to sit with pt as he needed incr time.     Stairs            Wheelchair Mobility    Modified Rankin (Stroke Patients Only)       Balance Overall balance assessment: Needs assistance;History of Falls         Standing balance support: Bilateral upper extremity supported;During functional activity Standing balance-Leahy Scale: Fair Standing balance comment: can stand statically with min guard assist without UE support.               High level balance activites: Direction changes;Turns High Level Balance Comments: Needed min guard assist due to being in unfamiliar environment and pt is blind.      Cognition Arousal/Alertness: Awake/alert Behavior During Therapy: WFL for tasks assessed/performed                        Exercises      General Comments        Pertinent Vitals/Pain Pain Assessment: No/denies pain  O2  at rest on RA 98%.  O2 91-99% on RA with ambulation.  Does not desat below 90% with activity or at rest.  Other VSS.      Home Living                      Prior Function            PT Goals (current goals can now be found in the care plan section) Progress towards PT goals: Progressing toward goals    Frequency  Min 3X/week    PT Plan Current plan remains appropriate    Co-evaluation             End of Session Equipment Utilized During Treatment: Gait belt Activity Tolerance: Patient tolerated treatment well Patient left: in chair;with call bell/phone within reach;with nursing/sitter in room (on 3N1 with NT with pt.)     Time: 1135-1158 PT Time Calculation  (min) (ACUTE ONLY): 23 min  Charges:  $Gait Training: 8-22 mins $Therapeutic Activity: 8-22 mins                    G CodesIrwin Brakeman F 02/12/15, 12:02 PM Timaya Bojarski,PT Acute Rehabilitation [phone] (pager)

## 2015-01-15 LAB — BODY FLUID CULTURE: Culture: NO GROWTH

## 2015-01-25 ENCOUNTER — Inpatient Hospital Stay: Payer: Medicare Other | Admitting: Adult Health

## 2015-02-01 ENCOUNTER — Encounter: Payer: Self-pay | Admitting: Gastroenterology

## 2015-02-16 ENCOUNTER — Ambulatory Visit (AMBULATORY_SURGERY_CENTER): Payer: Medicare Other | Admitting: Gastroenterology

## 2015-02-16 ENCOUNTER — Encounter: Payer: Self-pay | Admitting: Gastroenterology

## 2015-02-16 VITALS — BP 126/80 | HR 88 | Temp 97.1°F | Resp 16 | Ht 63.0 in | Wt 136.0 lb

## 2015-02-16 DIAGNOSIS — Z1211 Encounter for screening for malignant neoplasm of colon: Secondary | ICD-10-CM

## 2015-02-16 DIAGNOSIS — K317 Polyp of stomach and duodenum: Secondary | ICD-10-CM

## 2015-02-16 DIAGNOSIS — K2990 Gastroduodenitis, unspecified, without bleeding: Secondary | ICD-10-CM

## 2015-02-16 DIAGNOSIS — Z86010 Personal history of colonic polyps: Secondary | ICD-10-CM

## 2015-02-16 DIAGNOSIS — D123 Benign neoplasm of transverse colon: Secondary | ICD-10-CM | POA: Diagnosis not present

## 2015-02-16 DIAGNOSIS — R195 Other fecal abnormalities: Secondary | ICD-10-CM

## 2015-02-16 DIAGNOSIS — D122 Benign neoplasm of ascending colon: Secondary | ICD-10-CM

## 2015-02-16 DIAGNOSIS — D125 Benign neoplasm of sigmoid colon: Secondary | ICD-10-CM

## 2015-02-16 DIAGNOSIS — K2970 Gastritis, unspecified, without bleeding: Secondary | ICD-10-CM

## 2015-02-16 MED ORDER — SODIUM CHLORIDE 0.9 % IV SOLN: 500.0000 mL | INTRAVENOUS | Status: DC

## 2015-02-16 MED ORDER — RANITIDINE HCL 150 MG PO TABS: 150.0000 mg | ORAL_TABLET | Freq: Two times a day (BID) | ORAL | Status: DC

## 2015-02-16 NOTE — Op Note (Signed)
[place]  [address] [place], [id number]   COLONOSCOPY PROCEDURE REPORT  PATIENT: Cameron Mendez, Cameron Mendez  MR#: [id number] BIRTHDATE: [birth year] , 68  yrs. old GENDER: male ENDOSCOPIST: [name], MD REFERRED BY: PROCEDURE DATE:  02/16/2015 PROCEDURE:   Colonoscopy with snare polypectomy and Submucosal injection, any substance First Screening Colonoscopy - Avg.  risk and is 50 yrs.  old or older - No.  Prior Negative Screening - Now for repeat screening. N/A  History of Adenoma - Now for follow-up colonoscopy & has been > or = to 3 yrs.  Yes hx of adenoma.  Has been 3 or more years since last colonoscopy.  Polyps Removed Today? Yes. ASA CLASS:   Class III INDICATIONS:Evaluation of unexplained GI bleeding and PH Colon Adenoma. MEDICATIONS: Monitored anesthesia care and Propofol 150 mg IV  DESCRIPTION OF PROCEDURE:   After the risks benefits and alternatives of the procedure were thoroughly explained, informed consent was obtained.  The digital rectal exam revealed no abnormalities of the rectum.   The LB [id number]  endoscope was introduced through the anus and advanced to the cecum, which was identified by both the appendix and ileocecal valve. No adverse events experienced.   The quality of the prep was excellent. (Suprep was used)  The instrument was then slowly withdrawn as the colon was fully examined.      COLON FINDINGS: A sessile polyp measuring 3 mm in size was found in the ascending colon.  A polypectomy was performed with a cold snare.  The resection was complete, the polyp tissue was completely retrieved and sent to histology.   A sessile polyp measuring 15 mm in size was found in the ascending colon.  A saline Injection was given to lift the mucosal wall.  A polypectomy was performed using snare cautery.  The resection was complete, the polyp tissue was completely retrieved and sent to histology.   A sessile polyp measuring 10 mm in  size was found in the ascending colon.  A polypectomy was performed with a cold snare.  The resection was complete, the polyp tissue was completely retrieved and sent to histology.   There was mild diverticulosis noted in the ascending colon.   Two sessile polyps ranging from 5 to 34mm in size were found in the sigmoid colon.  A polypectomy was performed with a cold snare.  The resection was complete, the polyp tissue was completely retrieved and sent to histology.   There was moderate diverticulosis noted in the sigmoid colon.  Retroflexed views revealed no abnormalities. The time to cecum = 4.3 Withdrawal time = 20.7   The scope was withdrawn and the procedure completed. COMPLICATIONS: There were no immediate complications.  ENDOSCOPIC IMPRESSION: 1.   Sessile polyp was found in the ascending colon; polypectomy was performed with a cold snare 2.   Sessile polyp was found in the ascending colon; saline was given to lift the mucosal wall.; polypectomy was performed using snare cautery 3.   Sessile polyp was found in the ascending colon; polypectomy was performed with a cold snare 4.   Mild diverticulosis was noted in the ascending colon 5.   Two sessile polyps ranging from 5 to 17mm in size were found in the sigmoid colon; polypectomy was performed with a cold snare 6.   Moderate diverticulosis was noted in the sigmoid colon  Findings could explain Heme positive stool  RECOMMENDATIONS: Colonoscopy 3 years  eSigned:  [name], MD 02/16/2015 2:31  PM   cc: [name], MD, Donato Heinz, MD   PATIENT NAME:  Cameron Mendez, Cameron Mendez MR#: [id number]

## 2015-02-16 NOTE — Progress Notes (Signed)
Pt had small amt of blood on pad with quarter size blood clot noted.  Dr. Deatra Ina aware

## 2015-02-16 NOTE — Progress Notes (Signed)
Report to PACU, RN, vss, BBS= Clear.  

## 2015-02-16 NOTE — Op Note (Signed)
[place]  [address]. [place], [id number]   ENDOSCOPY PROCEDURE REPORT  PATIENT: Cameron, Mendez  MR#: [id number] BIRTHDATE: [birth year] , 68  yrs. old GENDER: male ENDOSCOPIST: [name], MD REFERRED BY: PROCEDURE DATE:  02/16/2015 PROCEDURE:  EGD w/ biopsy ASA CLASS:     Class III INDICATIONS:  anemia and heme positive stool. MEDICATIONS: Residual sedation present, Monitored anesthesia care, Propofol 50 mg IV, and Lidocaine 100 mg IV TOPICAL ANESTHETIC:  DESCRIPTION OF PROCEDURE: After the risks benefits and alternatives of the procedure were thoroughly explained, informed consent was obtained.  The LB [id number] endoscope was introduced through the mouth and advanced to the second portion of the duodenum , Without limitations.  The instrument was slowly withdrawn as the mucosa was fully examined.      DUODENUM: Moderate duodenal inflammation was found in the duodenal bulb.   Multiple biopsies were performed.  STOMACH: Moderate chronic gastritis (inflammation) was found in the gastric body.  Multiple biopsies were performed using cold forceps. A sessile polyp ranging between 3-79mm in size with a friable surface was found in the cardia.  Multiple biopsies was performed. Retroflexed views revealed no abnormalities.     The scope was then withdrawn from the patient and the procedure completed.  COMPLICATIONS: There were no immediate complications.  ENDOSCOPIC IMPRESSION: 1.   Duodenal inflammation was found in the duodenal bulb 2.   Chronic gastritis (inflammation) was found in the gastric body; multiple biopsies were performed 3.   Sessile polyp ranging between 3-56mm in size was found in the cardia; multiple biopsies was performed  Findings could explain Hemoccult-positive stool  RECOMMENDATIONS: Await biopsy results  REPEAT EXAM:  eSigned:  [name], MD 02/16/2015 2:41 PM    CC: [name], MD, Donato Heinz,  MD  PATIENT NAME:  Cameron, Mendez MR#: [id number]

## 2015-02-16 NOTE — Patient Instructions (Signed)

## 2015-02-16 NOTE — Progress Notes (Signed)
Called to room to assist during endoscopic procedure.  Patient ID and intended procedure confirmed with present staff. Received instructions for my participation in the procedure from the performing physician.  

## 2015-02-17 ENCOUNTER — Telehealth: Payer: Self-pay | Admitting: *Deleted

## 2015-02-17 NOTE — Telephone Encounter (Signed)
  Follow up Call-  Call back number 02/16/2015  Post procedure Call Back phone  # [phone]     Patient questions:  Do you have a fever, pain , or abdominal swelling? No. Pain Score  0 *  Have you tolerated food without any problems? Yes.    Have you been able to return to your normal activities? Yes.    Do you have any questions about your discharge instructions: Diet   No. Medications  No. Follow up visit  No.  Do you have questions or concerns about your Care? No.  Actions: * If pain score is 4 or above: No action needed, pain <4.

## 2015-02-24 ENCOUNTER — Encounter: Payer: Self-pay | Admitting: Gastroenterology

## 2015-03-03 ENCOUNTER — Observation Stay (HOSPITAL_COMMUNITY)
Admission: EM | Admit: 2015-03-03 | Discharge: 2015-03-04 | Disposition: A | Discharge status: Home / Self Care | Payer: Medicare Other | Attending: Cardiovascular Disease | Admitting: Cardiovascular Disease

## 2015-03-03 ENCOUNTER — Encounter (HOSPITAL_COMMUNITY): Payer: Self-pay | Admitting: General Practice

## 2015-03-03 DIAGNOSIS — D638 Anemia in other chronic diseases classified elsewhere: Secondary | ICD-10-CM | POA: Diagnosis not present

## 2015-03-03 DIAGNOSIS — I509 Heart failure, unspecified: Secondary | ICD-10-CM | POA: Insufficient documentation

## 2015-03-03 DIAGNOSIS — Z888 Allergy status to other drugs, medicaments and biological substances status: Secondary | ICD-10-CM | POA: Diagnosis not present

## 2015-03-03 DIAGNOSIS — J449 Chronic obstructive pulmonary disease, unspecified: Secondary | ICD-10-CM | POA: Diagnosis not present

## 2015-03-03 DIAGNOSIS — I120 Hypertensive chronic kidney disease with stage 5 chronic kidney disease or end stage renal disease: Secondary | ICD-10-CM | POA: Insufficient documentation

## 2015-03-03 DIAGNOSIS — Z992 Dependence on renal dialysis: Secondary | ICD-10-CM | POA: Diagnosis not present

## 2015-03-03 DIAGNOSIS — F17210 Nicotine dependence, cigarettes, uncomplicated: Secondary | ICD-10-CM | POA: Insufficient documentation

## 2015-03-03 DIAGNOSIS — Z886 Allergy status to analgesic agent status: Secondary | ICD-10-CM | POA: Diagnosis not present

## 2015-03-03 DIAGNOSIS — N186 End stage renal disease: Secondary | ICD-10-CM | POA: Diagnosis not present

## 2015-03-03 DIAGNOSIS — I480 Paroxysmal atrial fibrillation: Secondary | ICD-10-CM | POA: Diagnosis present

## 2015-03-03 DIAGNOSIS — D696 Thrombocytopenia, unspecified: Secondary | ICD-10-CM | POA: Insufficient documentation

## 2015-03-03 DIAGNOSIS — M109 Gout, unspecified: Secondary | ICD-10-CM | POA: Insufficient documentation

## 2015-03-03 DIAGNOSIS — I739 Peripheral vascular disease, unspecified: Secondary | ICD-10-CM | POA: Diagnosis not present

## 2015-03-03 DIAGNOSIS — I499 Cardiac arrhythmia, unspecified: Secondary | ICD-10-CM

## 2015-03-03 DIAGNOSIS — Z89022 Acquired absence of left finger(s): Secondary | ICD-10-CM | POA: Diagnosis not present

## 2015-03-03 HISTORY — DX: Cardiac arrhythmia, unspecified: I49.9

## 2015-03-03 LAB — CBC WITH DIFFERENTIAL/PLATELET
Basophils Absolute: 0 10*3/uL (ref 0.0–0.1)
Basophils Relative: 0 % (ref 0–1)
EOS PCT: 0 % (ref 0–5)
Eosinophils Absolute: 0 10*3/uL (ref 0.0–0.7)
HEMATOCRIT: 31.6 % — AB (ref 39.0–52.0)
Hemoglobin: 9.2 g/dL — ABNORMAL LOW (ref 13.0–17.0)
Lymphocytes Relative: 13 % (ref 12–46)
Lymphs Abs: 0.7 10*3/uL (ref 0.7–4.0)
MCH: 29.9 pg (ref 26.0–34.0)
MCHC: 29.1 g/dL — ABNORMAL LOW (ref 30.0–36.0)
MCV: 102.6 fL — AB (ref 78.0–100.0)
MONO ABS: 0.5 10*3/uL (ref 0.1–1.0)
MONOS PCT: 10 % (ref 3–12)
NEUTROS ABS: 3.8 10*3/uL (ref 1.7–7.7)
Neutrophils Relative %: 77 % (ref 43–77)
Platelets: 94 10*3/uL — ABNORMAL LOW (ref 150–400)
RBC: 3.08 MIL/uL — AB (ref 4.22–5.81)
RDW: 18.1 % — AB (ref 11.5–15.5)
WBC: 5 10*3/uL (ref 4.0–10.5)

## 2015-03-03 LAB — BASIC METABOLIC PANEL
Anion gap: 11 (ref 5–15)
BUN: 9 mg/dL (ref 6–23)
CALCIUM: 8.4 mg/dL (ref 8.4–10.5)
CHLORIDE: 96 mmol/L (ref 96–112)
CO2: 32 mmol/L (ref 19–32)
Creatinine, Ser: 2.93 mg/dL — ABNORMAL HIGH (ref 0.50–1.35)
GFR calc Af Amer: 24 mL/min — ABNORMAL LOW (ref >90)
GFR calc non Af Amer: 21 mL/min — ABNORMAL LOW (ref >90)
GLUCOSE: 79 mg/dL (ref 70–99)
Potassium: 3.2 mmol/L — ABNORMAL LOW (ref 3.5–5.1)
Sodium: 139 mmol/L (ref 135–145)

## 2015-03-03 LAB — PROTIME-INR
INR: 1.1 (ref 0.00–1.49)
Prothrombin Time: 14.3 seconds (ref 11.6–15.2)

## 2015-03-03 LAB — URINE MICROSCOPIC-ADD ON

## 2015-03-03 LAB — URINALYSIS, ROUTINE W REFLEX MICROSCOPIC
Bilirubin Urine: NEGATIVE
Glucose, UA: NEGATIVE mg/dL
HGB URINE DIPSTICK: NEGATIVE
Ketones, ur: NEGATIVE mg/dL
NITRITE: NEGATIVE
Protein, ur: 100 mg/dL — AB
SPECIFIC GRAVITY, URINE: 1.009 (ref 1.005–1.030)
UROBILINOGEN UA: 0.2 mg/dL (ref 0.0–1.0)
pH: 8.5 — ABNORMAL HIGH (ref 5.0–8.0)

## 2015-03-03 LAB — TROPONIN I
TROPONIN I: 0.04 ng/mL — AB (ref <0.031)
Troponin I: 0.04 ng/mL — ABNORMAL HIGH (ref <0.031)

## 2015-03-03 MED ORDER — IPRATROPIUM-ALBUTEROL 0.5-2.5 (3) MG/3ML IN SOLN: 3.0000 mL | RESPIRATORY_TRACT | Status: DC | PRN

## 2015-03-03 MED ORDER — DIPHENHYDRAMINE HCL 25 MG PO CAPS: 25.0000 mg | ORAL_CAPSULE | Freq: Four times a day (QID) | ORAL | Status: DC | PRN

## 2015-03-03 MED ORDER — POTASSIUM CHLORIDE CRYS ER 10 MEQ PO TBCR
10.0000 meq | EXTENDED_RELEASE_TABLET | Freq: Once | ORAL | Status: AC
  Administered 2015-03-04: 10 meq via ORAL
  Filled 2015-03-03: qty 1

## 2015-03-03 MED ORDER — FAMOTIDINE 20 MG PO TABS
20.0000 mg | ORAL_TABLET | Freq: Every day | ORAL | Status: DC
  Administered 2015-03-04: 20 mg via ORAL
  Filled 2015-03-03: qty 1

## 2015-03-03 MED ORDER — NEPRO/CARBSTEADY PO LIQD
237.0000 mL | Freq: Two times a day (BID) | ORAL | Status: DC
  Administered 2015-03-04 (×2): 237 mL via ORAL

## 2015-03-03 MED ORDER — PANTOPRAZOLE SODIUM 40 MG PO TBEC
40.0000 mg | DELAYED_RELEASE_TABLET | Freq: Two times a day (BID) | ORAL | Status: DC
  Administered 2015-03-04: 40 mg via ORAL
  Filled 2015-03-03 (×2): qty 1

## 2015-03-03 MED ORDER — TIOTROPIUM BROMIDE MONOHYDRATE 18 MCG IN CAPS
18.0000 ug | ORAL_CAPSULE | Freq: Every day | RESPIRATORY_TRACT | Status: DC
  Filled 2015-03-03: qty 5

## 2015-03-03 MED ORDER — RENA-VITE PO TABS
1.0000 | ORAL_TABLET | Freq: Every day | ORAL | Status: DC
  Administered 2015-03-04: 1 via ORAL
  Filled 2015-03-03 (×2): qty 1

## 2015-03-03 MED ORDER — CARVEDILOL 25 MG PO TABS
25.0000 mg | ORAL_TABLET | Freq: Two times a day (BID) | ORAL | Status: DC
  Administered 2015-03-04 (×2): 25 mg via ORAL
  Filled 2015-03-03 (×3): qty 1

## 2015-03-03 MED ORDER — CIPROFLOXACIN HCL 250 MG PO TABS
250.0000 mg | ORAL_TABLET | Freq: Two times a day (BID) | ORAL | Status: DC
  Administered 2015-03-04 (×2): 250 mg via ORAL
  Filled 2015-03-03 (×3): qty 1

## 2015-03-03 MED ORDER — ALLOPURINOL 100 MG PO TABS
100.0000 mg | ORAL_TABLET | Freq: Every day | ORAL | Status: DC
  Administered 2015-03-04: 100 mg via ORAL
  Filled 2015-03-03: qty 1

## 2015-03-03 NOTE — ED Provider Notes (Signed)
CSN: [id number]     Arrival date & time 03/03/15  1730 History   First MD Initiated Contact with Patient 03/03/15 1739     Chief Complaint  Patient presents with  . Irregular Heart Beat     (Consider location/radiation/quality/duration/timing/severity/associated sxs/prior Treatment) HPI The patient is a dialysis today and reportedly had a heart rate that jumped up to 140. He was sent to the emergency department for evaluation. The patient does not recall any symptoms at the time. He denies that he's been experiencing chest pain or palpitation. He does not recall feeling lightheaded or near syncopal. He has no acute complaints. Past Medical History  Diagnosis Date  . Anemia   . Diastolic dysfunction   . Retinitis pigmentosa     Blindness--has shunt --placed yrs ago in [place]..  . Arthritis     Gout  . Hypertension     dx--"long time"  . CKD (chronic kidney disease), stage IV     a. L upper extremity AV fistula created 07/2012.  [name] BPH (benign prostatic hyperplasia)   . Blind   . Gangrene of finger     a. 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  . Colon polyps   . Pericardial effusion     a. Noted on echo 10/2009. b. Again seen on echo 09/2013.  [name] CHF (congestive heart failure)   . COPD (chronic obstructive pulmonary disease)   . GI (gastrointestinal bleed) feb 2016   Past Surgical History  Procedure Laterality Date  . Cataract extraction w/ intraocular lens  implant, bilateral Bilateral   . Knee ligament reconstruction Left   . Av fistula placement  07/15/2012    Procedure: ARTERIOVENOUS (AV) FISTULA CREATION;  Surgeon: Rosetta Posner, MD;  Location: [facility];  Service: Vascular;  Laterality: Left;  . Amputation  12/30/2012    Procedure: AMPUTATION DIGIT;  Surgeon: Tennis Must, MD;  Location: [facility];  Service: Orthopedics;  Laterality: Left;  LEFT SMALL FINGER AMPUTATION  . Pericardiocentesis  09/2013  . Pericardial tap N/A 09/19/2013    Procedure:  PERICARDIAL TAP;  Surgeon: Blane Ohara, MD;  Location[facility] C[facility] LAB;  Service: Cardiovascular;  Laterality: N/A;   Family History  Problem Relation Age of Onset  . Ovarian cancer Mother   . Kidney disease Neg Hx   . Gallbladder disease Neg Hx   . Esophageal cancer Neg Hx   . Heart disease Neg Hx   . Stomach cancer Neg Hx   . Colon polyps Brother   . Colon cancer Brother    History  Substance Use Topics  . Smoking status: Current Every Day Smoker -- 0.50 packs/day for 50 years    Types: Cigarettes  . Smokeless tobacco: Never Used     Comment: Pt given handout on how to quit smoking 12/31/14  . Alcohol Use: No     Comment: "quit driinking in ~ 2000"    Review of Systems 10 Systems reviewed and are negative for acute change except as noted in the HPI   Allergies  Ibuprofen and Nsaids  Home Medications   Prior to Admission medications   Medication Sig Start Date End Date Taking? Authorizing Provider  acetaminophen (TYLENOL) 500 MG tablet Take 1,000 mg by mouth every 6 (six) hours as needed for moderate pain.   Yes Historical Provider, MD  allopurinol (ZYLOPRIM) 100 MG tablet Take 100 mg by mouth daily.    Yes Historical Provider, MD  carvedilol (COREG) 25 MG tablet  Take 1 tablet (25 mg total) by mouth 2 (two) times daily with a meal. 01/14/15  Yes Belkys A Regalado, MD  diphenhydrAMINE (BENADRYL) 25 MG tablet Take 25 mg by mouth every 6 (six) hours as needed for itching or allergies (congestion).    Yes Historical Provider, MD  guaiFENesin (MUCINEX) 600 MG 12 hr tablet Take 1 tablet (600 mg total) by mouth 2 (two) times daily. Patient taking differently: Take 600 mg by mouth 2 (two) times daily as needed (congestion).  09/15/14  Yes [name], MD  Tiotropium Bromide Monohydrate (SPIRIVA RESPIMAT) 2.5 MCG/ACT AERS Inhale 2 puffs into the lungs every morning. 08/04/14  Yes [name], MD  doxercalciferol (HECTOROL) 4 MCG/2ML injection Inject 0.5 mLs (1 mcg total) into the  vein every Monday, Wednesday, and Friday with hemodialysis. 08/24/14   [name], DO  ferric gluconate 125 mg in sodium chloride 0.9 % 100 mL Inject 125 mg into the vein every Monday, Wednesday, and Friday with hemodialysis. 08/24/14   [name], DO  ipratropium-albuterol (DUONEB) 0.5-2.5 (3) MG/3ML SOLN Take 3 mLs by nebulization every 4 (four) hours as needed. Patient taking differently: Take 3 mLs by nebulization every 4 (four) hours as needed (shortness of breath/wheezing).  09/15/14   [name], MD  multivitamin (RENA-VIT) TABS tablet Take 1 tablet by mouth at bedtime. 09/15/14   [name], MD  Nutritional Supplements (FEEDING SUPPLEMENT, NEPRO CARB STEADY,) LIQD Take 237 mLs by mouth 2 (two) times daily between meals. Patient not taking: Reported on 02/16/2015 01/14/15   Belkys A Regalado, MD  pantoprazole (PROTONIX) 40 MG tablet Take 1 tablet (40 mg total) by mouth 2 (two) times daily. Patient not taking: Reported on 02/16/2015 01/14/15   Belkys A Regalado, MD  ranitidine (ZANTAC) 150 MG tablet Take 1 tablet (150 mg total) by mouth 2 (two) times daily. 02/16/15   [name], MD   BP 131/55 mmHg  Pulse 76  Temp(Src) 98 F (36.7 C) (Oral)  Resp 28  SpO2 99% Physical Exam  Constitutional: He is oriented to person, place, and time.  The patient is alert and in no acute respiratory distress.  HENT:  Head: Normocephalic and atraumatic.  Mouth/Throat: Oropharynx is clear and moist.  Eyes:  Patient is blind.  Cardiovascular:  Regular rhythm with frequent ectopic beats. 2/6 systolic ejection murmur.  Pulmonary/Chest: Effort normal and breath sounds normal. No respiratory distress.  Abdominal: Soft. He exhibits no distension. There is no tenderness.  Musculoskeletal: Normal range of motion. He exhibits no edema.  Neurological: He is alert and oriented to person, place, and time. Coordination normal.  Skin: Skin is warm and dry.  Psychiatric: He has a normal mood and affect.     ED Course  Procedures (including critical care time) Labs Review Labs Reviewed  BASIC METABOLIC PANEL - Abnormal; Notable for the following:    Potassium 3.2 (*)    Creatinine, Ser 2.93 (*)    GFR calc non Af Amer 21 (*)    GFR calc Af Amer 24 (*)    All other components within normal limits  TROPONIN I - Abnormal; Notable for the following:    Troponin I 0.04 (*)    All other components within normal limits  CBC WITH DIFFERENTIAL/PLATELET - Abnormal; Notable for the following:    RBC 3.08 (*)    Hemoglobin 9.2 (*)    HCT 31.6 (*)    MCV 102.6 (*)    MCHC 29.1 (*)  RDW 18.1 (*)    Platelets 94 (*)    All other components within normal limits  URINALYSIS, ROUTINE W REFLEX MICROSCOPIC - Abnormal; Notable for the following:    pH 8.5 (*)    Protein, ur 100 (*)    Leukocytes, UA SMALL (*)    All other components within normal limits  TROPONIN I - Abnormal; Notable for the following:    Troponin I 0.04 (*)    All other components within normal limits  URINE CULTURE  PROTIME-INR  URINE MICROSCOPIC-ADD ON    Imaging Review No results found.   EKG Interpretation   Date/Time:  Wednesday March 03 2015 17:40:02 EDT Ventricular Rate:  79 PR Interval:  122 QRS Duration: 94 QT Interval:  460 QTC Calculation: 527 R Axis:   40 Text Interpretation:  Sinus rhythm Multiform ventricular premature  complexes Repol abnrm suggests ischemia, diffuse leads agree. no  significant change from old Confirmed by Johnney Killian, MD, Jeannie Done [phone]) on  03/03/2015 8:36:13 PM     Consult: Heywood Footman he has seen the patient and planned for admission MDM   Final diagnoses:  Paroxysmal atrial fibrillation  ESRD (end stage renal disease) on dialysis   The patient appears to have paroxysmal atrial fibrillation. Rates were in 140s at dialysis.     Charlesetta Shanks, MD 03/03/15 [phone]

## 2015-03-03 NOTE — ED Notes (Signed)
MD made aware pt does not make much urine.  Per pt wife pt was called today by his urologist and told he has a UTI.  Pt has medications waiting at the pharmacy for this.

## 2015-03-03 NOTE — ED Notes (Signed)
Pt placed into gown and on monitor upon arrival to room. Pt monitored by blood pressure, pulse ox, and 12 lead.  

## 2015-03-03 NOTE — ED Notes (Signed)
Pt brought in via [facility] from dialysis center. About half way through treatment HD RN noticed pt HR went up to 144 with some irregularity. Pt is A/O. Pt denies any cardiac history. Pt denies CP, and SOB. EMS V/S 132/59, HR 71, RR 16, CBG 116, SPO2 116. EMS EKG showed going in and out of A-fib.

## 2015-03-03 NOTE — H&P (Signed)
Referring Physician:  [name] is an 69 y.o. male.                       Chief Complaint: Irregular heart beats   HPI: 69 year old male with past history of hypertension, CKD with hemodialysis, Anemia, Retinitis Pigmentosa, Arthritis, Gangrene of left little finger, COPD had intermittent fast and irregular heart rhythm with mild hypokalemia. No chest pain or dizziness.   Past Medical History  Diagnosis Date  . Anemia   . Diastolic dysfunction   . Retinitis pigmentosa     Blindness--has shunt --placed yrs ago in [place]..  . Arthritis     Gout  . Hypertension     dx--"long time"  . CKD (chronic kidney disease), stage IV     a. L upper extremity AV fistula created 07/2012.  [name] BPH (benign prostatic hyperplasia)   . Blind   . Gangrene of finger     a. 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  . Colon polyps   . Pericardial effusion     a. Noted on echo 10/2009. b. Again seen on echo 09/2013.  [name] CHF (congestive heart failure)   . COPD (chronic obstructive pulmonary disease)   . GI (gastrointestinal bleed) feb 2016      Past Surgical History  Procedure Laterality Date  . Cataract extraction w/ intraocular lens  implant, bilateral Bilateral   . Knee ligament reconstruction Left   . Av fistula placement  07/15/2012    Procedure: ARTERIOVENOUS (AV) FISTULA CREATION;  Surgeon: Rosetta Posner, MD;  Location: [facility];  Service: Vascular;  Laterality: Left;  . Amputation  12/30/2012    Procedure: AMPUTATION DIGIT;  Surgeon: Tennis Must, MD;  Location: [facility];  Service: Orthopedics;  Laterality: Left;  LEFT SMALL FINGER AMPUTATION  . Pericardiocentesis  09/2013  . Pericardial tap N/A 09/19/2013    Procedure: PERICARDIAL TAP;  Surgeon: Blane Ohara, MD;  Location: [facility] CATH [facility];  Service: Cardiovascular;  Laterality: N/A;    Family History  Problem Relation Age of Onset  . Ovarian cancer Mother   . Kidney disease Neg Hx   . Gallbladder disease Neg Hx   .  Esophageal cancer Neg Hx   . Heart disease Neg Hx   . Stomach cancer Neg Hx   . Colon polyps Brother   . Colon cancer Brother    Social History:  reports that he has been smoking Cigarettes.  He has a 25 pack-year smoking history. He has never used smokeless tobacco. He reports that he does not drink alcohol or use illicit drugs.  Allergies:  Allergies  Allergen Reactions  . Ibuprofen Other (See Comments)    Kidney problems-dialysis patient  . Nsaids Other (See Comments)    Kidney problems-dialysis patient     (Not in a hospital admission)  Results for orders placed or performed during the hospital encounter of 03/03/15 (from the past 48 hour(s))  Basic metabolic panel     Status: Abnormal   Collection Time: 03/03/15  6:57 PM  Result Value Ref Range   Sodium 139 135 - 145 mmol/L   Potassium 3.2 (L) 3.5 - 5.1 mmol/L   Chloride 96 96 - 112 mmol/L   CO2 32 19 - 32 mmol/L   Glucose, Bld 79 70 - 99 mg/dL   BUN 9 6 - 23 mg/dL   Creatinine, Ser 2.93 (H) 0.50 - 1.35 mg/dL   Calcium 8.4 8.4 -  10.5 mg/dL   GFR calc non Af Amer 21 (L) >90 mL/min   GFR calc Af Amer 24 (L) >90 mL/min    Comment: (NOTE) The eGFR has been calculated using the CKD EPI equation. This calculation has not been validated in all clinical situations. eGFR's persistently <90 mL/min signify possible Chronic Kidney Disease.    Anion gap 11 5 - 15  Troponin I     Status: Abnormal   Collection Time: 03/03/15  6:57 PM  Result Value Ref Range   Troponin I 0.04 (H) <0.031 ng/mL    Comment:        PERSISTENTLY INCREASED TROPONIN VALUES IN THE RANGE OF 0.04-0.49 ng/mL CAN BE SEEN IN:       -UNSTABLE ANGINA       -CONGESTIVE HEART FAILURE       -MYOCARDITIS       -CHEST TRAUMA       -ARRYHTHMIAS       -LATE PRESENTING MYOCARDIAL INFARCTION       -COPD   CLINICAL FOLLOW-UP RECOMMENDED.   CBC with Differential     Status: Abnormal   Collection Time: 03/03/15  6:57 PM  Result Value Ref Range   WBC 5.0 4.0 -  10.5 K/uL    Comment: WHITE COUNT CONFIRMED ON SMEAR   RBC 3.08 (L) 4.22 - 5.81 MIL/uL   Hemoglobin 9.2 (L) 13.0 - 17.0 g/dL   HCT 31.6 (L) 39.0 - 52.0 %   MCV 102.6 (H) 78.0 - 100.0 fL   MCH 29.9 26.0 - 34.0 pg   MCHC 29.1 (L) 30.0 - 36.0 g/dL   RDW 18.1 (H) 11.5 - 15.5 %   Platelets 94 (L) 150 - 400 K/uL    Comment: REPEATED TO VERIFY SPECIMEN CHECKED FOR CLOTS PLATELET COUNT CONFIRMED BY SMEAR CONSISTENT WITH PREVIOUS RESULT    Neutrophils Relative % 77 43 - 77 %   Lymphocytes Relative 13 12 - 46 %   Monocytes Relative 10 3 - 12 %   Eosinophils Relative 0 0 - 5 %   Basophils Relative 0 0 - 1 %   Neutro Abs 3.8 1.7 - 7.7 K/uL   Lymphs Abs 0.7 0.7 - 4.0 K/uL   Monocytes Absolute 0.5 0.1 - 1.0 K/uL   Eosinophils Absolute 0.0 0.0 - 0.7 K/uL   Basophils Absolute 0.0 0.0 - 0.1 K/uL   RBC Morphology POLYCHROMASIA PRESENT    Smear Review LARGE PLATELETS PRESENT   Protime-INR     Status: None   Collection Time: 03/03/15  6:57 PM  Result Value Ref Range   Prothrombin Time 14.3 11.6 - 15.2 seconds   INR 1.10 0.00 - 1.49  Urinalysis, Routine w reflex microscopic     Status: Abnormal   Collection Time: 03/03/15  7:50 PM  Result Value Ref Range   Color, Urine YELLOW YELLOW   APPearance CLEAR CLEAR   Specific Gravity, Urine 1.009 1.005 - 1.030   pH 8.5 (H) 5.0 - 8.0   Glucose, UA NEGATIVE NEGATIVE mg/dL   Hgb urine dipstick NEGATIVE NEGATIVE   Bilirubin Urine NEGATIVE NEGATIVE   Ketones, ur NEGATIVE NEGATIVE mg/dL   Protein, ur 100 (A) NEGATIVE mg/dL   Urobilinogen, UA 0.2 0.0 - 1.0 mg/dL   Nitrite NEGATIVE NEGATIVE   Leukocytes, UA SMALL (A) NEGATIVE  Urine microscopic-add on     Status: None   Collection Time: 03/03/15  7:50 PM  Result Value Ref Range   Squamous Epithelial / LPF RARE RARE  WBC, UA 3-6 <3 WBC/hpf   RBC / HPF 0-2 <3 RBC/hpf   Bacteria, UA RARE RARE  Troponin I     Status: Abnormal   Collection Time: 03/03/15  9:39 PM  Result Value Ref Range   Troponin I  0.04 (H) <0.031 ng/mL    Comment:        PERSISTENTLY INCREASED TROPONIN VALUES IN THE RANGE OF 0.04-0.49 ng/mL CAN BE SEEN IN:       -UNSTABLE ANGINA       -CONGESTIVE HEART FAILURE       -MYOCARDITIS       -CHEST TRAUMA       -ARRYHTHMIAS       -LATE PRESENTING MYOCARDIAL INFARCTION       -COPD   CLINICAL FOLLOW-UP RECOMMENDED.    No results found.  Review Of Systems Constitutional: No Weight Loss, No Weight Gain, Night Sweats, Fevers, Chills, Dizziness, Fatigue, or Generalized Weakness HEENT: No Headaches, Difficulty Swallowing,Tooth/Dental Problems,Sore Throat,  No Sneezing, Rhinitis, Ear Ache, Nasal Congestion, or Post Nasal Drip, + blindness with retinitis pigmentosa. Cardio-vascular: No Chest pain, Orthopnea, PND, Edema in Lower Extremities, Anasarca, Dizziness, Palpitations. H/O PVD.  Resp: No Dyspnea, No DOE, No Productive Cough, No Non-Productive Cough, No Hemoptysis, No Wheezing.  GI: No Heartburn, Indigestion, Abdominal Pain, Nausea, Vomiting, Diarrhea, Hematemesis, Hematochezia, Melena, Change in Bowel Habits, Loss of Appetite  GU: Scanty urine production with CKD, V.  Musculoskeletal: No Joint Pain or Swelling, No Decreased Range of Motion, No Back Pain.  Neurologic: No Syncope, No Seizures, Muscle Weakness, Paresthesia, +Legal Blindness, No Vertigo, No Difficulty Walking,  Skin: No Rash or Lesions. Psych: No Change in Mood or Affect, No Depression or Anxiety, No Memory loss, No Confusion, or Hallucinations  Blood pressure 122/45, pulse 71, temperature 98 F (36.7 C), temperature source Oral, resp. rate 19, SpO2 95 %. GEN: He is alert and oriented x 2; Averagely built and nourished. HEENT: Normocephalic and Atraumatic, Mucous membranes pink; PERRLA; EOM intact;  No scleral icterus, Nares: Patent, Oropharynx: Clear,  Neck: 50 % ROM, No Cervical Lymphadenopathy nor Thyromegaly or Carotid Bruit; No JVD; CHEST WALL: No tenderness CHEST: Normal respiration,  clear to auscultation bilaterally. HEART: Irregular rate and rhythm; no murmurs rubs or gallops BACK: No kyphosis or scoliosis; No CVA tenderness ABDOMEN: Positive Bowel Sounds, Scaphoid, Soft Non-Tender; No Masses, No Organomegaly. EXTREMITIES: No Cyanosis, Clubbing, or Edema; S/P Left little finger amputation. SKIN: Normal hydration no rash or ulceration CNS: A x O x 2, Legal Blindness, No Focal Deficits  Assessment/Plan Paroxysmal atrial fibrillation Possible multifocal atrial tachycardia ESRD with hemodialysis Hypertension PVD COPD  Place in observation Echocardiogram Telemetry  Cameron Riddle, MD  03/03/2015, 11:02 PM

## 2015-03-04 ENCOUNTER — Encounter (HOSPITAL_COMMUNITY): Payer: Self-pay | Admitting: General Practice

## 2015-03-04 LAB — CBC
HEMATOCRIT: 29.8 % — AB (ref 39.0–52.0)
Hemoglobin: 9.1 g/dL — ABNORMAL LOW (ref 13.0–17.0)
MCH: 31.8 pg (ref 26.0–34.0)
MCHC: 30.5 g/dL (ref 30.0–36.0)
MCV: 104.2 fL — AB (ref 78.0–100.0)
Platelets: 94 10*3/uL — ABNORMAL LOW (ref 150–400)
RBC: 2.86 MIL/uL — ABNORMAL LOW (ref 4.22–5.81)
RDW: 17.7 % — ABNORMAL HIGH (ref 11.5–15.5)
WBC: 3.9 10*3/uL — ABNORMAL LOW (ref 4.0–10.5)

## 2015-03-04 LAB — TSH: TSH: 1.411 u[IU]/mL (ref 0.350–4.500)

## 2015-03-04 LAB — BASIC METABOLIC PANEL
ANION GAP: 9 (ref 5–15)
BUN: 15 mg/dL (ref 6–23)
CHLORIDE: 97 mmol/L (ref 96–112)
CO2: 33 mmol/L — ABNORMAL HIGH (ref 19–32)
CREATININE: 3.72 mg/dL — AB (ref 0.50–1.35)
Calcium: 8.2 mg/dL — ABNORMAL LOW (ref 8.4–10.5)
GFR, EST AFRICAN AMERICAN: 18 mL/min — AB (ref >90)
GFR, EST NON AFRICAN AMERICAN: 15 mL/min — AB (ref >90)
Glucose, Bld: 88 mg/dL (ref 70–99)
Potassium: 3.3 mmol/L — ABNORMAL LOW (ref 3.5–5.1)
Sodium: 139 mmol/L (ref 135–145)

## 2015-03-04 MED ORDER — SODIUM CHLORIDE 0.9 % IJ SOLN
3.0000 mL | Freq: Two times a day (BID) | INTRAMUSCULAR | Status: DC
  Administered 2015-03-04 (×2): 3 mL via INTRAVENOUS

## 2015-03-04 MED ORDER — ONDANSETRON HCL 4 MG PO TABS: 4.0000 mg | ORAL_TABLET | Freq: Four times a day (QID) | ORAL | Status: DC | PRN

## 2015-03-04 MED ORDER — HEPARIN SODIUM (PORCINE) 5000 UNIT/ML IJ SOLN
5000.0000 [IU] | Freq: Three times a day (TID) | INTRAMUSCULAR | Status: DC
  Administered 2015-03-04: 5000 [IU] via SUBCUTANEOUS
  Filled 2015-03-04 (×3): qty 1

## 2015-03-04 MED ORDER — ADULT MULTIVITAMIN W/MINERALS CH
1.0000 | ORAL_TABLET | Freq: Every day | ORAL | Status: DC
  Administered 2015-03-04: 1 via ORAL
  Filled 2015-03-04: qty 1

## 2015-03-04 MED ORDER — DILTIAZEM HCL ER COATED BEADS 120 MG PO TB24
120.0000 mg | ORAL_TABLET | Freq: Every day | ORAL | Status: DC
  Filled 2015-03-04: qty 1

## 2015-03-04 MED ORDER — SODIUM CHLORIDE 0.9 % IJ SOLN: 3.0000 mL | Freq: Two times a day (BID) | INTRAMUSCULAR | Status: DC

## 2015-03-04 MED ORDER — ALUM & MAG HYDROXIDE-SIMETH 200-200-20 MG/5ML PO SUSP: 30.0000 mL | Freq: Four times a day (QID) | ORAL | Status: DC | PRN

## 2015-03-04 MED ORDER — ONDANSETRON HCL 4 MG/2ML IJ SOLN: 4.0000 mg | Freq: Four times a day (QID) | INTRAMUSCULAR | Status: DC | PRN

## 2015-03-04 MED ORDER — SODIUM CHLORIDE 0.9 % IJ SOLN: 3.0000 mL | INTRAMUSCULAR | Status: DC | PRN

## 2015-03-04 MED ORDER — POTASSIUM CHLORIDE ER 10 MEQ PO TBCR
10.0000 meq | EXTENDED_RELEASE_TABLET | Freq: Three times a day (TID) | ORAL | Status: DC
  Administered 2015-03-04: 10 meq via ORAL
  Filled 2015-03-04 (×2): qty 1

## 2015-03-04 MED ORDER — CIPROFLOXACIN HCL 250 MG PO TABS
250.0000 mg | ORAL_TABLET | Freq: Two times a day (BID) | ORAL | Status: DC
Start: 2015-03-04 — End: 2015-09-14

## 2015-03-04 MED ORDER — IPRATROPIUM-ALBUTEROL 0.5-2.5 (3) MG/3ML IN SOLN: 3.0000 mL | RESPIRATORY_TRACT | Status: DC | PRN

## 2015-03-04 MED ORDER — DILTIAZEM HCL ER COATED BEADS 120 MG PO TB24
120.0000 mg | ORAL_TABLET | Freq: Every day | ORAL | Status: DC
Start: 2015-03-04 — End: 2015-09-14

## 2015-03-04 MED ORDER — GUAIFENESIN ER 600 MG PO TB12: 600.0000 mg | ORAL_TABLET | Freq: Two times a day (BID) | ORAL | Status: DC | PRN

## 2015-03-04 MED ORDER — ACETAMINOPHEN 325 MG PO TABS: 650.0000 mg | ORAL_TABLET | Freq: Four times a day (QID) | ORAL | Status: DC | PRN

## 2015-03-04 MED ORDER — SODIUM CHLORIDE 0.9 % IV SOLN: 250.0000 mL | INTRAVENOUS | Status: DC | PRN

## 2015-03-04 MED ORDER — ACETAMINOPHEN 650 MG RE SUPP: 650.0000 mg | Freq: Four times a day (QID) | RECTAL | Status: DC | PRN

## 2015-03-04 NOTE — Discharge Summary (Addendum)
Physician Discharge Summary  Patient ID: Cameron Mendez MRN: [id number] DOB/AGE: 69-Sep-1947 69 y.o.  Admit date: 03/03/2015 Discharge date: 03/04/2015  Admission Diagnoses: Paroxysmal atrial fibrillation Possible multifocal atrial tachycardia ESRD with hemodialysis Hypertension PVD COPD  Discharge Diagnoses:  Principle Problem: * Paroxysmal atrial fibrillation * Possible multifocal atrial tachycardia ESRD with hemodialysis Hypertension PVD COPD Anemia of chronic disease Thrombocytopenia  Discharged Condition: fair  Hospital Course: 69 year old male with past history of hypertension, CKD with hemodialysis, Anemia, Retinitis Pigmentosa, Arthritis, Gangrene of left little finger, COPD had intermittent fast and irregular heart rhythm with mild hypokalemia. No chest pain or dizziness. He converted to sinus rhythm. He was placed on small dose of diltiazem and discharged home. He will be followed by me in 1 month.  Consults: cardiology  Significant Diagnostic Studies: labs: Hgb 9.1. WBC count of 3.9 K and Platelets count of 94 K. Normal TSH. EKG-SR, Premature atrial and ventricular complexes.  Treatments: cardiac meds: carvedilol and diltiazem.  CHADS2-score of 1/6 and CHADSVASC Score of 3/9. No aspirin or coumadin due to anemia.  Discharge Exam: Blood pressure 130/41, pulse 69, temperature 98.7 F (37.1 C), temperature source Oral, resp. rate 16, height 5\' 3"  (1.6 m), weight 58.968 kg (130 lb), SpO2 99 %. GEN: He is alert and oriented x 2; Averagely built and nourished. HEENT: Normocephalic and Atraumatic, Mucous membranes pink; PERRLA; EOM intact; No scleral icterus, Nares: Patent, Oropharynx: Clear,  Neck: 50 % ROM, No Cervical Lymphadenopathy nor Thyromegaly or Carotid Bruit; No JVD; CHEST WALL: No tenderness CHEST: Normal respiration, clear to auscultation bilaterally. HEART: Irregular rate and rhythm; no murmurs rubs or gallops BACK: No kyphosis or scoliosis; No CVA  tenderness ABDOMEN: Positive Bowel Sounds, Scaphoid, Soft Non-Tender; No Masses, No Organomegaly. EXTREMITIES: No Cyanosis, Clubbing, or Edema; S/P Left little finger amputation. SKIN: Normal hydration no rash or ulceration CNS: A x O x 2, Legal Blindness, No Focal Deficits  Disposition: 01-Home or Self Care     Medication List    TAKE these medications        acetaminophen 500 MG tablet  Commonly known as:  TYLENOL  Take 1,000 mg by mouth every 6 (six) hours as needed for moderate pain.     allopurinol 100 MG tablet  Commonly known as:  ZYLOPRIM  Take 100 mg by mouth daily.     carvedilol 25 MG tablet  Commonly known as:  COREG  Take 1 tablet (25 mg total) by mouth 2 (two) times daily with a meal.     ciprofloxacin 250 MG tablet  Commonly known as:  CIPRO  Take 1 tablet (250 mg total) by mouth 2 (two) times daily.     diltiazem 120 MG 24 hr tablet  Commonly known as:  CARDIZEM LA  Take 1 tablet (120 mg total) by mouth daily.     diphenhydrAMINE 25 MG tablet  Commonly known as:  BENADRYL  Take 25 mg by mouth every 6 (six) hours as needed for itching or allergies (congestion).     doxercalciferol 4 MCG/2ML injection  Commonly known as:  HECTOROL  Inject 0.5 mLs (1 mcg total) into the vein every Monday, Wednesday, and Friday with hemodialysis.     feeding supplement (NEPRO CARB STEADY) Liqd  Take 237 mLs by mouth 2 (two) times daily between meals.     ferric gluconate 125 mg in sodium chloride 0.9 % 100 mL  Inject 125 mg into the vein every Monday, Wednesday, and Friday with hemodialysis.  guaiFENesin 600 MG 12 hr tablet  Commonly known as:  MUCINEX  Take 1 tablet (600 mg total) by mouth 2 (two) times daily as needed (congestion).     ipratropium-albuterol 0.5-2.5 (3) MG/3ML Soln  Commonly known as:  DUONEB  Take 3 mLs by nebulization every 4 (four) hours as needed (shortness of breath/wheezing).     multivitamin Tabs tablet  Take 1 tablet by mouth at  bedtime.     pantoprazole 40 MG tablet  Commonly known as:  PROTONIX  Take 1 tablet (40 mg total) by mouth 2 (two) times daily.     ranitidine 150 MG tablet  Commonly known as:  ZANTAC  Take 1 tablet (150 mg total) by mouth 2 (two) times daily.     Tiotropium Bromide Monohydrate 2.5 MCG/ACT Aers  Commonly known as:  SPIRIVA RESPIMAT  Inhale 2 puffs into the lungs every morning.           Follow-up Information    Follow up with [facility] K, MD In 1 month.   Specialty:  Family Medicine   Contact information:   [address] [place] [address] [phone]       Follow up with [facility] S, MD. Schedule an appointment as soon as possible for a visit in 1 month.   Specialty:  Cardiology   Why:  echocardiogram   Contact information:   [place] [address] [phone]       Signed: Birdie Riddle 03/04/2015, 6:55 PM

## 2015-03-04 NOTE — Progress Notes (Signed)
Pt discharged home. Oral instructions given to patient. Printed instructions given to sister when she arrived to pick him up. Taken to front entrance via wheel chair.

## 2015-03-04 NOTE — Progress Notes (Signed)
UR completed 

## 2015-03-05 LAB — URINE CULTURE
Colony Count: NO GROWTH
Culture: NO GROWTH

## 2015-08-02 ENCOUNTER — Other Ambulatory Visit: Payer: Self-pay | Admitting: Emergency Medicine

## 2015-09-13 ENCOUNTER — Inpatient Hospital Stay (HOSPITAL_COMMUNITY): Payer: Medicare Other

## 2015-09-13 ENCOUNTER — Encounter (HOSPITAL_COMMUNITY): Payer: Self-pay

## 2015-09-13 ENCOUNTER — Emergency Department (HOSPITAL_COMMUNITY): Payer: Medicare Other

## 2015-09-13 ENCOUNTER — Inpatient Hospital Stay (HOSPITAL_COMMUNITY)
Admission: EM | Admit: 2015-09-13 | Discharge: 2015-09-21 | DRG: 208 | Disposition: A | Discharge status: Home Health | Payer: Medicare Other | Attending: Internal Medicine | Admitting: Internal Medicine

## 2015-09-13 DIAGNOSIS — I953 Hypotension of hemodialysis: Secondary | ICD-10-CM | POA: Diagnosis not present

## 2015-09-13 DIAGNOSIS — I469 Cardiac arrest, cause unspecified: Secondary | ICD-10-CM

## 2015-09-13 DIAGNOSIS — I468 Cardiac arrest due to other underlying condition: Secondary | ICD-10-CM | POA: Diagnosis present

## 2015-09-13 DIAGNOSIS — H548 Legal blindness, as defined in USA: Secondary | ICD-10-CM | POA: Diagnosis not present

## 2015-09-13 DIAGNOSIS — R1310 Dysphagia, unspecified: Secondary | ICD-10-CM | POA: Diagnosis not present

## 2015-09-13 DIAGNOSIS — D638 Anemia in other chronic diseases classified elsewhere: Secondary | ICD-10-CM | POA: Diagnosis not present

## 2015-09-13 DIAGNOSIS — I132 Hypertensive heart and chronic kidney disease with heart failure and with stage 5 chronic kidney disease, or end stage renal disease: Secondary | ICD-10-CM | POA: Diagnosis not present

## 2015-09-13 DIAGNOSIS — R197 Diarrhea, unspecified: Secondary | ICD-10-CM | POA: Diagnosis not present

## 2015-09-13 DIAGNOSIS — Z992 Dependence on renal dialysis: Secondary | ICD-10-CM | POA: Diagnosis not present

## 2015-09-13 DIAGNOSIS — N400 Enlarged prostate without lower urinary tract symptoms: Secondary | ICD-10-CM | POA: Diagnosis not present

## 2015-09-13 DIAGNOSIS — I480 Paroxysmal atrial fibrillation: Secondary | ICD-10-CM | POA: Diagnosis not present

## 2015-09-13 DIAGNOSIS — M109 Gout, unspecified: Secondary | ICD-10-CM | POA: Diagnosis present

## 2015-09-13 DIAGNOSIS — J449 Chronic obstructive pulmonary disease, unspecified: Secondary | ICD-10-CM | POA: Diagnosis present

## 2015-09-13 DIAGNOSIS — Z982 Presence of cerebrospinal fluid drainage device: Secondary | ICD-10-CM

## 2015-09-13 DIAGNOSIS — Z886 Allergy status to analgesic agent status: Secondary | ICD-10-CM | POA: Diagnosis not present

## 2015-09-13 DIAGNOSIS — E872 Acidosis: Secondary | ICD-10-CM | POA: Diagnosis present

## 2015-09-13 DIAGNOSIS — Y95 Nosocomial condition: Secondary | ICD-10-CM | POA: Diagnosis not present

## 2015-09-13 DIAGNOSIS — D696 Thrombocytopenia, unspecified: Secondary | ICD-10-CM | POA: Diagnosis present

## 2015-09-13 DIAGNOSIS — I5033 Acute on chronic diastolic (congestive) heart failure: Secondary | ICD-10-CM | POA: Diagnosis present

## 2015-09-13 DIAGNOSIS — Z8673 Personal history of transient ischemic attack (TIA), and cerebral infarction without residual deficits: Secondary | ICD-10-CM

## 2015-09-13 DIAGNOSIS — N186 End stage renal disease: Secondary | ICD-10-CM

## 2015-09-13 DIAGNOSIS — Z79899 Other long term (current) drug therapy: Secondary | ICD-10-CM | POA: Diagnosis not present

## 2015-09-13 DIAGNOSIS — N2581 Secondary hyperparathyroidism of renal origin: Secondary | ICD-10-CM | POA: Diagnosis present

## 2015-09-13 DIAGNOSIS — E875 Hyperkalemia: Secondary | ICD-10-CM | POA: Diagnosis present

## 2015-09-13 DIAGNOSIS — E1122 Type 2 diabetes mellitus with diabetic chronic kidney disease: Secondary | ICD-10-CM | POA: Diagnosis not present

## 2015-09-13 DIAGNOSIS — J948 Other specified pleural conditions: Secondary | ICD-10-CM | POA: Diagnosis not present

## 2015-09-13 DIAGNOSIS — J9600 Acute respiratory failure, unspecified whether with hypoxia or hypercapnia: Secondary | ICD-10-CM | POA: Diagnosis not present

## 2015-09-13 DIAGNOSIS — G9340 Encephalopathy, unspecified: Secondary | ICD-10-CM | POA: Diagnosis present

## 2015-09-13 DIAGNOSIS — Z89022 Acquired absence of left finger(s): Secondary | ICD-10-CM | POA: Diagnosis not present

## 2015-09-13 DIAGNOSIS — J189 Pneumonia, unspecified organism: Secondary | ICD-10-CM | POA: Diagnosis not present

## 2015-09-13 DIAGNOSIS — E1165 Type 2 diabetes mellitus with hyperglycemia: Secondary | ICD-10-CM | POA: Diagnosis not present

## 2015-09-13 DIAGNOSIS — F17210 Nicotine dependence, cigarettes, uncomplicated: Secondary | ICD-10-CM | POA: Diagnosis not present

## 2015-09-13 DIAGNOSIS — J9601 Acute respiratory failure with hypoxia: Secondary | ICD-10-CM

## 2015-09-13 DIAGNOSIS — H3552 Pigmentary retinal dystrophy: Secondary | ICD-10-CM | POA: Diagnosis present

## 2015-09-13 LAB — COMPREHENSIVE METABOLIC PANEL
ALBUMIN: 2.8 g/dL — AB (ref 3.5–5.0)
ALK PHOS: 53 U/L (ref 38–126)
ALT: 111 U/L — ABNORMAL HIGH (ref 17–63)
ANION GAP: 17 — AB (ref 5–15)
AST: 121 U/L — ABNORMAL HIGH (ref 15–41)
BILIRUBIN TOTAL: 0.8 mg/dL (ref 0.3–1.2)
BUN: 39 mg/dL — ABNORMAL HIGH (ref 6–20)
CALCIUM: 9.3 mg/dL (ref 8.9–10.3)
CO2: 21 mmol/L — ABNORMAL LOW (ref 22–32)
Chloride: 96 mmol/L — ABNORMAL LOW (ref 101–111)
Creatinine, Ser: 10.4 mg/dL — ABNORMAL HIGH (ref 0.61–1.24)
GFR calc non Af Amer: 4 mL/min — ABNORMAL LOW (ref >60)
GFR, EST AFRICAN AMERICAN: 5 mL/min — AB (ref >60)
GLUCOSE: 154 mg/dL — AB (ref 65–99)
Potassium: 6.3 mmol/L (ref 3.5–5.1)
Sodium: 134 mmol/L — ABNORMAL LOW (ref 135–145)
TOTAL PROTEIN: 6.2 g/dL — AB (ref 6.5–8.1)

## 2015-09-13 LAB — I-STAT ARTERIAL BLOOD GAS, ED
ACID-BASE EXCESS: 4 mmol/L — AB (ref 0.0–2.0)
Bicarbonate: 26.4 mEq/L — ABNORMAL HIGH (ref 20.0–24.0)
O2 SAT: 99 %
PCO2 ART: 30.8 mmHg — AB (ref 35.0–45.0)
PH ART: 7.54 — AB (ref 7.350–7.450)
TCO2: 27 mmol/L (ref 0–100)
pO2, Arterial: 140 mmHg — ABNORMAL HIGH (ref 80.0–100.0)

## 2015-09-13 LAB — I-STAT CHEM 8, ED
BUN: 45 mg/dL — AB (ref 6–20)
CALCIUM ION: 1.16 mmol/L (ref 1.13–1.30)
CHLORIDE: 102 mmol/L (ref 101–111)
Creatinine, Ser: 9.9 mg/dL — ABNORMAL HIGH (ref 0.61–1.24)
Glucose, Bld: 150 mg/dL — ABNORMAL HIGH (ref 65–99)
HEMATOCRIT: 27 % — AB (ref 39.0–52.0)
Hemoglobin: 9.2 g/dL — ABNORMAL LOW (ref 13.0–17.0)
Potassium: 6.2 mmol/L (ref 3.5–5.1)
Sodium: 136 mmol/L (ref 135–145)
TCO2: 21 mmol/L (ref 0–100)

## 2015-09-13 LAB — CBC WITH DIFFERENTIAL/PLATELET
Basophils Absolute: 0 10*3/uL (ref 0.0–0.1)
Basophils Relative: 0 %
Eosinophils Absolute: 0 10*3/uL (ref 0.0–0.7)
Eosinophils Relative: 0 %
HEMATOCRIT: 26.3 % — AB (ref 39.0–52.0)
HEMOGLOBIN: 8.1 g/dL — AB (ref 13.0–17.0)
LYMPHS ABS: 0.6 10*3/uL — AB (ref 0.7–4.0)
LYMPHS PCT: 7 %
MCH: 31 pg (ref 26.0–34.0)
MCHC: 30.8 g/dL (ref 30.0–36.0)
MCV: 100.8 fL — AB (ref 78.0–100.0)
MONOS PCT: 3 %
Monocytes Absolute: 0.3 10*3/uL (ref 0.1–1.0)
NEUTROS ABS: 8.2 10*3/uL — AB (ref 1.7–7.7)
NEUTROS PCT: 90 %
Platelets: 75 10*3/uL — ABNORMAL LOW (ref 150–400)
RBC: 2.61 MIL/uL — ABNORMAL LOW (ref 4.22–5.81)
RDW: 17.4 % — ABNORMAL HIGH (ref 11.5–15.5)
WBC: 9.1 10*3/uL (ref 4.0–10.5)

## 2015-09-13 LAB — BASIC METABOLIC PANEL
ANION GAP: 14 (ref 5–15)
ANION GAP: 17 — AB (ref 5–15)
BUN: 42 mg/dL — ABNORMAL HIGH (ref 6–20)
BUN: 18 mg/dL (ref 6–20)
CALCIUM: 9.4 mg/dL (ref 8.9–10.3)
CALCIUM: 9.3 mg/dL (ref 8.9–10.3)
CO2: 25 mmol/L (ref 22–32)
CO2: 25 mmol/L (ref 22–32)
CREATININE: 10.47 mg/dL — AB (ref 0.61–1.24)
CREATININE: 4.37 mg/dL — AB (ref 0.61–1.24)
Chloride: 100 mmol/L — ABNORMAL LOW (ref 101–111)
Chloride: 92 mmol/L — ABNORMAL LOW (ref 101–111)
GFR calc Af Amer: 15 mL/min — ABNORMAL LOW (ref >60)
GFR, EST AFRICAN AMERICAN: 5 mL/min — AB (ref >60)
GFR, EST NON AFRICAN AMERICAN: 4 mL/min — AB (ref >60)
GFR, EST NON AFRICAN AMERICAN: 13 mL/min — AB (ref >60)
GLUCOSE: 165 mg/dL — AB (ref 65–99)
GLUCOSE: 112 mg/dL — AB (ref 65–99)
Potassium: 5 mmol/L (ref 3.5–5.1)
Potassium: 3.7 mmol/L (ref 3.5–5.1)
Sodium: 139 mmol/L (ref 135–145)
Sodium: 134 mmol/L — ABNORMAL LOW (ref 135–145)

## 2015-09-13 LAB — I-STAT CG4 LACTIC ACID, ED
LACTIC ACID, VENOUS: 1.04 mmol/L (ref 0.5–2.0)
Lactic Acid, Venous: 6.98 mmol/L (ref 0.5–2.0)

## 2015-09-13 LAB — PROTIME-INR
INR: 1.24 (ref 0.00–1.49)
INR: 1.19 (ref 0.00–1.49)
Prothrombin Time: 15.8 seconds — ABNORMAL HIGH (ref 11.6–15.2)
Prothrombin Time: 15.3 seconds — ABNORMAL HIGH (ref 11.6–15.2)

## 2015-09-13 LAB — PHOSPHORUS: PHOSPHORUS: 7.9 mg/dL — AB (ref 2.5–4.6)

## 2015-09-13 LAB — MAGNESIUM: MAGNESIUM: 3.1 mg/dL — AB (ref 1.7–2.4)

## 2015-09-13 LAB — BRAIN NATRIURETIC PEPTIDE: B Natriuretic Peptide: 1766.2 pg/mL — ABNORMAL HIGH (ref 0.0–100.0)

## 2015-09-13 LAB — APTT
APTT: 49 s — AB (ref 24–37)
APTT: 40 s — AB (ref 24–37)

## 2015-09-13 LAB — MRSA PCR SCREENING: MRSA by PCR: NEGATIVE

## 2015-09-13 LAB — TROPONIN I
TROPONIN I: 0.06 ng/mL — AB (ref <0.031)
TROPONIN I: 0.1 ng/mL — AB (ref <0.031)
TROPONIN I: 0.13 ng/mL — AB (ref <0.031)

## 2015-09-13 LAB — PROCALCITONIN: PROCALCITONIN: 7.74 ng/mL

## 2015-09-13 LAB — I-STAT TROPONIN, ED: Troponin i, poc: 0.02 ng/mL (ref 0.00–0.08)

## 2015-09-13 LAB — GLUCOSE, CAPILLARY
Glucose-Capillary: 144 mg/dL — ABNORMAL HIGH (ref 65–99)
Glucose-Capillary: 190 mg/dL — ABNORMAL HIGH (ref 65–99)

## 2015-09-13 MED ORDER — CARVEDILOL 12.5 MG PO TABS
12.5000 mg | ORAL_TABLET | Freq: Two times a day (BID) | ORAL | Status: DC
  Administered 2015-09-13 – 2015-09-14 (×2): 12.5 mg via ORAL
  Filled 2015-09-13 (×2): qty 1

## 2015-09-13 MED ORDER — ALTEPLASE 2 MG IJ SOLR
2.0000 mg | Freq: Once | INTRAMUSCULAR | Status: DC | PRN
Start: 2015-09-13 — End: 2015-09-17
  Filled 2015-09-13: qty 2

## 2015-09-13 MED ORDER — ETOMIDATE 2 MG/ML IV SOLN
INTRAVENOUS | Status: AC | PRN
  Administered 2015-09-13: 20 mg via INTRAVENOUS

## 2015-09-13 MED ORDER — PENTAFLUOROPROP-TETRAFLUOROETH EX AERO: 1.0000 "application " | INHALATION_SPRAY | CUTANEOUS | Status: DC | PRN

## 2015-09-13 MED ORDER — HEPARIN SODIUM (PORCINE) 1000 UNIT/ML DIALYSIS: 1000.0000 [IU] | INTRAMUSCULAR | Status: DC | PRN

## 2015-09-13 MED ORDER — DILTIAZEM HCL 60 MG PO TABS
60.0000 mg | ORAL_TABLET | Freq: Two times a day (BID) | ORAL | Status: DC
  Administered 2015-09-13 (×2): 60 mg via ORAL
  Filled 2015-09-13 (×2): qty 1

## 2015-09-13 MED ORDER — PIPERACILLIN-TAZOBACTAM 3.375 G IVPB 30 MIN
3.3750 g | Freq: Once | INTRAVENOUS | Status: AC
  Administered 2015-09-13: 3.375 g via INTRAVENOUS
  Filled 2015-09-13: qty 50

## 2015-09-13 MED ORDER — FENTANYL CITRATE (PF) 100 MCG/2ML IJ SOLN: 100.0000 ug | INTRAMUSCULAR | Status: DC | PRN

## 2015-09-13 MED ORDER — MIDAZOLAM HCL 2 MG/2ML IJ SOLN: 2.0000 mg | INTRAMUSCULAR | Status: DC | PRN

## 2015-09-13 MED ORDER — ETOMIDATE 2 MG/ML IV SOLN
INTRAVENOUS | Status: AC
  Filled 2015-09-13: qty 20

## 2015-09-13 MED ORDER — CHLORHEXIDINE GLUCONATE 0.12% ORAL RINSE (MEDLINE KIT)
15.0000 mL | Freq: Two times a day (BID) | OROMUCOSAL | Status: DC
  Administered 2015-09-13 – 2015-09-16 (×6): 15 mL via OROMUCOSAL

## 2015-09-13 MED ORDER — IPRATROPIUM-ALBUTEROL 0.5-2.5 (3) MG/3ML IN SOLN
3.0000 mL | Freq: Four times a day (QID) | RESPIRATORY_TRACT | Status: DC
  Administered 2015-09-13 – 2015-09-18 (×18): 3 mL via RESPIRATORY_TRACT
  Filled 2015-09-13 (×19): qty 3

## 2015-09-13 MED ORDER — ROCURONIUM BROMIDE 50 MG/5ML IV SOLN
INTRAVENOUS | Status: AC
  Filled 2015-09-13: qty 2

## 2015-09-13 MED ORDER — SODIUM CHLORIDE 0.9 % IV SOLN
INTRAVENOUS | Status: AC | PRN
  Administered 2015-09-13: 1000 mL via INTRAVENOUS

## 2015-09-13 MED ORDER — SUCCINYLCHOLINE CHLORIDE 20 MG/ML IJ SOLN
INTRAMUSCULAR | Status: AC
  Filled 2015-09-13: qty 1

## 2015-09-13 MED ORDER — SODIUM CHLORIDE 0.9 % IV SOLN: 100.0000 mL | INTRAVENOUS | Status: DC | PRN

## 2015-09-13 MED ORDER — ASPIRIN 81 MG PO TABS: 81.0000 mg | ORAL_TABLET | Freq: Every day | ORAL | Status: DC

## 2015-09-13 MED ORDER — HYDRALAZINE HCL 20 MG/ML IJ SOLN
10.0000 mg | INTRAMUSCULAR | Status: DC | PRN
  Administered 2015-09-13 – 2015-09-14 (×2): 10 mg via INTRAVENOUS
  Filled 2015-09-13 (×2): qty 1

## 2015-09-13 MED ORDER — PIPERACILLIN-TAZOBACTAM IN DEX 2-0.25 GM/50ML IV SOLN
2.2500 g | Freq: Three times a day (TID) | INTRAVENOUS | Status: DC
  Administered 2015-09-13 – 2015-09-19 (×17): 2.25 g via INTRAVENOUS
  Filled 2015-09-13 (×21): qty 50

## 2015-09-13 MED ORDER — ASPIRIN 81 MG PO CHEW
81.0000 mg | CHEWABLE_TABLET | Freq: Every day | ORAL | Status: DC
  Administered 2015-09-13 – 2015-09-21 (×9): 81 mg via ORAL
  Filled 2015-09-13 (×10): qty 1

## 2015-09-13 MED ORDER — ANTISEPTIC ORAL RINSE SOLUTION (CORINZ)
7.0000 mL | OROMUCOSAL | Status: DC
  Administered 2015-09-13 – 2015-09-16 (×31): 7 mL via OROMUCOSAL

## 2015-09-13 MED ORDER — ALBUTEROL SULFATE (2.5 MG/3ML) 0.083% IN NEBU: 2.5000 mg | INHALATION_SOLUTION | RESPIRATORY_TRACT | Status: DC | PRN

## 2015-09-13 MED ORDER — SODIUM CHLORIDE 0.9 % IV SOLN: 2000.0000 mL | Freq: Once | INTRAVENOUS | Status: AC

## 2015-09-13 MED ORDER — NOREPINEPHRINE BITARTRATE 1 MG/ML IV SOLN
0.0000 ug/min | INTRAVENOUS | Status: DC
  Filled 2015-09-13: qty 4

## 2015-09-13 MED ORDER — FENTANYL CITRATE (PF) 100 MCG/2ML IJ SOLN
25.0000 ug | INTRAMUSCULAR | Status: DC | PRN
  Administered 2015-09-13 – 2015-09-14 (×3): 50 ug via INTRAVENOUS
  Filled 2015-09-13 (×3): qty 2

## 2015-09-13 MED ORDER — MIDAZOLAM HCL 2 MG/2ML IJ SOLN
1.0000 mg | INTRAMUSCULAR | Status: DC | PRN
  Administered 2015-09-13: 2 mg via INTRAVENOUS
  Filled 2015-09-13: qty 2

## 2015-09-13 MED ORDER — PANTOPRAZOLE SODIUM 40 MG IV SOLR
40.0000 mg | Freq: Every day | INTRAVENOUS | Status: DC
  Administered 2015-09-13: 40 mg via INTRAVENOUS
  Filled 2015-09-13: qty 40

## 2015-09-13 MED ORDER — HEPARIN SODIUM (PORCINE) 5000 UNIT/ML IJ SOLN
5000.0000 [IU] | Freq: Three times a day (TID) | INTRAMUSCULAR | Status: DC
  Administered 2015-09-13 – 2015-09-20 (×22): 5000 [IU] via SUBCUTANEOUS
  Filled 2015-09-13 (×22): qty 1

## 2015-09-13 MED ORDER — ALBUTEROL SULFATE (2.5 MG/3ML) 0.083% IN NEBU: 2.5000 mg | INHALATION_SOLUTION | Freq: Four times a day (QID) | RESPIRATORY_TRACT | Status: DC

## 2015-09-13 MED ORDER — LIDOCAINE HCL (PF) 1 % IJ SOLN: 5.0000 mL | INTRAMUSCULAR | Status: DC | PRN

## 2015-09-13 MED ORDER — INSULIN ASPART 100 UNIT/ML ~~LOC~~ SOLN
0.0000 [IU] | SUBCUTANEOUS | Status: DC
Start: 2015-09-13 — End: 2015-09-14
  Administered 2015-09-13: 2 [IU] via SUBCUTANEOUS
  Administered 2015-09-13: 1 [IU] via SUBCUTANEOUS

## 2015-09-13 MED ORDER — IPRATROPIUM BROMIDE 0.02 % IN SOLN
0.5000 mg | Freq: Four times a day (QID) | RESPIRATORY_TRACT | Status: DC
Start: 2015-09-13 — End: 2015-09-13

## 2015-09-13 MED ORDER — ACETAMINOPHEN 160 MG/5ML PO SOLN
650.0000 mg | ORAL | Status: DC | PRN
  Administered 2015-09-13: 650 mg via ORAL
  Filled 2015-09-13: qty 20.3

## 2015-09-13 MED ORDER — LIDOCAINE HCL (CARDIAC) 20 MG/ML IV SOLN
INTRAVENOUS | Status: AC
  Filled 2015-09-13: qty 5

## 2015-09-13 MED ORDER — ROCURONIUM BROMIDE 50 MG/5ML IV SOLN
INTRAVENOUS | Status: AC | PRN
  Administered 2015-09-13: 100 mg via INTRAVENOUS

## 2015-09-13 MED ORDER — LIDOCAINE-PRILOCAINE 2.5-2.5 % EX CREA
1.0000 "application " | TOPICAL_CREAM | CUTANEOUS | Status: DC | PRN
  Filled 2015-09-13: qty 5

## 2015-09-13 NOTE — Procedures (Signed)
ELECTROENCEPHALOGRAM REPORT   Patient: Cameron Mendez       Room #: 2H11 EEG No. ID: [id number] Age: 69 y.o.        Sex: male Referring Physician: Ashok Cordia Report Date:  09/13/2015        Interpreting Physician: Alexis Goodell  History: Cameron Mendez is an 69 y.o. male unresponsive s/p cardiac arrest.  Medications:  Scheduled: . antiseptic oral rinse  7 mL Mouth Rinse 10 times per day  . aspirin  81 mg Oral Daily  . carvedilol  12.5 mg Oral BID WC  . chlorhexidine gluconate  15 mL Mouth Rinse BID  . diltiazem  60 mg Oral BID  . heparin  5,000 Units Subcutaneous 3 times per day  . insulin aspart  0-9 Units Subcutaneous 6 times per day  . ipratropium-albuterol  3 mL Nebulization Q6H  . lidocaine (cardiac) 100 mg/82ml      . pantoprazole (PROTONIX) IV  40 mg Intravenous QHS  . piperacillin-tazobactam (ZOSYN)  IV  2.25 g Intravenous Q8H  . succinylcholine        Conditions of Recording:  This is a 16 channel EEG carried out with the patient in the intubated but unsedated state.  Description:  The background activity is not continuous.  It consists of a very low voltage delta rhythm that is diffusely distributed.  This low voltage slow activity alternates with intermittent bursts of higher voltage theta activity lasting up to one second.  This activity is synchronous between the two hemispheres.  No epileptiform activity is noted.  There is no evidence of normal drowse or sleep.   Hyperventilation and intermittent photic stimulation were not performed.   IMPRESSION: This is an abnormal EEG due to a discontinuous slow background.  No epileptiform activity is noted.  This finding is usually seen with sedation but in this unsedated patient may suggest a less than favorable outcome.  Would repeat at a later date if information to help with prognosis is required.     Alexis Goodell, MD [facility] [phone] 09/13/2015, 4:58 PM

## 2015-09-13 NOTE — Care Management Note (Signed)
Case Management Note  Patient Details  Name: Cameron Mendez MRN: [id number] Date of Birth: 1946-09-22  Subjective/Objective:    Adm w arrest, vent                Action/Plan:lives w fam   Expected Discharge Date:                  Expected Discharge Plan:     In-House Referral:     Discharge planning Services     Post Acute Care Choice:    Choice offered to:     DME Arranged:    DME Agency:     HH Arranged:    [place] Agency:     Status of Service:     Medicare Important Message Given:    Date Medicare IM Given:    Medicare IM give by:    Date Additional Medicare IM Given:    Additional Medicare Important Message give by:     If discussed at [place] of Stay Meetings, dates discussed:    Additional Comments:ur review done  Lacretia Leigh, RN 09/13/2015, 11:42 AM

## 2015-09-13 NOTE — ED Notes (Signed)
With EMS capnography 33, bp was 130/58

## 2015-09-13 NOTE — Progress Notes (Signed)
ANTIBIOTIC CONSULT NOTE - INITIAL  Pharmacy Consult for zosyn Indication: rule out pneumonia  Allergies  Allergen Reactions  . Ibuprofen Other (See Comments)    Kidney problems-dialysis patient  . Nsaids Other (See Comments)    Kidney problems-dialysis patient    Patient Measurements: Height: 5\' 5"  (165.1 cm) Weight: 160 lb (72.576 kg) IBW/kg (Calculated) : 61.5 Adjusted Body Weight:   Vital Signs: Temp: 96.9 F (36.1 C) (10/03 0644) Temp Source: Temporal (10/03 0644) BP: 182/60 mmHg (10/03 0945) Pulse Rate: 68 (10/03 0945) Intake/Output from previous day:   Intake/Output from this shift:    Labs:  Recent Labs  09/13/15 0657 09/13/15 0705  WBC 9.1  --   HGB 8.1* 9.2*  PLT 75*  --   CREATININE 10.40* 9.90*   Estimated Creatinine Clearance: 6.1 mL/min (by C-G formula based on Cr of 9.9). No results for input(s): VANCOTROUGH, VANCOPEAK, VANCORANDOM, GENTTROUGH, GENTPEAK, GENTRANDOM, TOBRATROUGH, TOBRAPEAK, TOBRARND, AMIKACINPEAK, AMIKACINTROU, AMIKACIN in the last 72 hours.   Microbiology: No results found for this or any previous visit (from the past 720 hour(s)).  Medical History: Past Medical History  Diagnosis Date  . Anemia   . Diastolic dysfunction   . Retinitis pigmentosa     Blindness--has shunt --placed yrs ago in [place]..  . Arthritis     Gout  . Hypertension     dx--"long time"  . CKD (chronic kidney disease), stage IV ([place])     a. L upper extremity AV fistula created 07/2012.  [name] BPH (benign prostatic hyperplasia)   . Blind   . Gangrene of finger ([place])     a. 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  . Colon polyps   . Pericardial effusion     a. Noted on echo 10/2009. b. Again seen on echo 09/2013.  [name] CHF (congestive heart failure) ([place])   . COPD (chronic obstructive pulmonary disease) ([place])   . GI (gastrointestinal bleed) feb 2016  . Irregular heart rate 03/03/2015    Medications:  Anti-infectives    Start     Dose/Rate Route  Frequency Ordered Stop   09/13/15 1830  piperacillin-tazobactam (ZOSYN) IVPB 2.25 g     2.25 g 100 mL/hr over 30 Minutes Intravenous Every 8 hours 09/13/15 1008     09/13/15 1015  piperacillin-tazobactam (ZOSYN) IVPB 3.375 g     3.375 g 100 mL/hr over 30 Minutes Intravenous  Once 09/13/15 1005       Assessment: 30 yom presented to the ED with cardiac arrest. To start zosyn for possible HCAP. Pt is hypothermic and WBC is WNL. Pt with history of ESRD and lactic acid is elevated.   Zosyn 10/3>>  Goal of Therapy:  Eradication of infection  Plan:  - Zosyn 3.375gm IV x 1 then 2.25gm Q8H - F/u renal plans, C&S, clinical status  Cameron Mendez, Rande Lawman 09/13/2015,10:08 AM

## 2015-09-13 NOTE — Progress Notes (Signed)
*  PRELIMINARY RESULTS* Echocardiogram 2D Echocardiogram has been performed.  Cameron Mendez 09/13/2015, 3:47 PM

## 2015-09-13 NOTE — Procedures (Signed)
Central Venous Catheter Insertion Procedure Note Cameron Mendez [id number] 1946/05/04  Procedure: Insertion of Central Venous Catheter Indications: Assessment of intravascular volume, Drug and/or fluid administration and Frequent blood sampling  Procedure Details Consent: Risks of procedure as well as the alternatives and risks of each were explained to the (patient/caregiver).  Consent for procedure obtained.   Time Out: Verified patient identification, verified procedure, site/side was marked, verified correct patient position, special equipment/implants available, medications/allergies/relevent history reviewed, required imaging and test results available.  Performed  Maximum sterile technique was used including antiseptics, cap, gloves, gown, hand hygiene, mask and sheet. Skin prep: Chlorhexidine; local anesthetic administered A antimicrobial bonded/coated triple lumen catheter was placed in the right internal jugular vein to 15 cm using the Seldinger technique.  Sutured x3.    Evaluation Blood flow good Complications: No apparent complications Patient did tolerate procedure well. Chest X-ray ordered to verify placement.  CXR: pending.   Procedure performed under direct supervision of Dr. Ashok Cordia and with ultrasound guidance for real time vessel cannulation.      Cameron Gens, NP-C [facility] Pgr: [phone] or if no answer [phone] 09/13/2015, 11:08 AM

## 2015-09-13 NOTE — ED Notes (Signed)
Intubated by Dr. Stark Jock, 7.5 ETT.

## 2015-09-13 NOTE — Consult Note (Signed)
Renal Service Consult Note [facility]  SOTA HETZ 09/13/2015 Sol Blazing Requesting Physician:  Dr Ashok Cordia  Reason for Consult:  ESRD patient arrested at home HPI: The patient is a 69 y.o. year-old with hx ESRD, COPD presenting after out-of-hosp arrest.  Resuscitated in the field , down 10-15 min approx. Per chart pt had been SOB approx 24 hours. When ems arrived pt was apneic and asytolic. They did CPR for approx 10- 15 min, pt regained pulse, brought to ED.  Last HD last Friday 3 days ago, due to for HD today. CXR showed RLL process, no edema. CT chest showing RLL consolidation and effusion, +air bronchograms.  No edema noted. Asked to see for dialysis.    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 Started on HD. May '15  pulm edema, COPD flare Sept '15 anemia Hb 6.2, transfused Oct  '15 low Hb, transfused, BM Bx done by IR f/u results as OP. COPD flare Feb '16 new R pleural effusion, tapped and negative for cytology, +exudative effusion, partial loc. GIB got 2u prbcs.  Mar '16 paroxysmal afib, possible MAT. ESRD on HD, HTN, COPD, low plts   Past Medical History  Past Medical History  Diagnosis Date  . Anemia   . Diastolic dysfunction   . Retinitis pigmentosa     Blindness--has shunt --placed yrs ago in [place]..  . Arthritis     Gout  . Hypertension     dx--"long time"  . CKD (chronic kidney disease), stage IV ([place])     a. L upper extremity AV fistula created 07/2012.  [name] BPH (benign prostatic hyperplasia)   . Blind   . Gangrene of finger ([place])     a. 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  . Colon polyps   . Pericardial effusion     a. Noted on echo 10/2009. b. Again seen on echo 09/2013.  [name] CHF (congestive heart failure) ([place])   . COPD (chronic obstructive pulmonary disease) ([place])   . GI  (gastrointestinal bleed) feb 2016  . Irregular heart rate 03/03/2015   Past Surgical History  Past Surgical History  Procedure Laterality Date  . Cataract extraction w/ intraocular lens  implant, bilateral Bilateral   . Knee ligament reconstruction Left   . Av fistula placement  07/15/2012    Procedure: ARTERIOVENOUS (AV) FISTULA CREATION;  Surgeon: Rosetta Posner, MD;  Location: [place];  Service: Vascular;  Laterality: Left;  . Amputation  12/30/2012    Procedure: AMPUTATION DIGIT;  Surgeon: Tennis Must, MD;  Location: [place];  Service: Orthopedics;  Laterality: Left;  LEFT SMALL FINGER AMPUTATION  . Pericardiocentesis  09/2013  . Pericardial tap N/A 09/19/2013    Procedure: PERICARDIAL TAP;  Surgeon: Blane Ohara, MD;  Location: [facility] C[facility] LAB;  Service: Cardiovascular;  Laterality: N/A;   Family History  Family History  Problem Relation Age of Onset  . Ovarian cancer Mother   . Kidney disease Neg Hx   . Gallbladder disease Neg Hx   . Esophageal cancer Neg Hx   . Heart disease Neg Hx   . Stomach cancer Neg Hx   . Colon polyps Brother   . Colon cancer Brother    Social History  reports that he has been smoking Cigarettes.  He has a 25 pack-year smoking history. He has  never used smokeless tobacco. He reports that he does not drink alcohol or use illicit drugs. Allergies  Allergies  Allergen Reactions  . Ibuprofen Other (See Comments)    Kidney problems-dialysis patient  . Nsaids Other (See Comments)    Kidney problems-dialysis patient   Home medications Prior to Admission medications   Medication Sig Start Date End Date Taking? Authorizing Provider  acetaminophen (TYLENOL) 500 MG tablet Take 1,000 mg by mouth every 6 (six) hours as needed for moderate pain.   Yes Historical Provider, MD  albuterol (PROVENTIL HFA;VENTOLIN HFA) 108 (90 BASE) MCG/ACT inhaler Inhale 1-2 puffs into the lungs every 6 (six) hours as needed for wheezing or shortness of breath.   Yes  Historical Provider, MD  allopurinol (ZYLOPRIM) 100 MG tablet Take 100 mg by mouth daily.    Yes Historical Provider, MD  aspirin 81 MG tablet Take 81 mg by mouth daily.   Yes Historical Provider, MD  carvedilol (COREG) 25 MG tablet Take 1 tablet (25 mg total) by mouth 2 (two) times daily with a meal. 01/14/15  Yes Belkys A Regalado, MD  diltiazem (CARDIZEM) 60 MG tablet Take 60 mg by mouth 2 (two) times daily. 08/20/15  Yes Historical Provider, MD  diphenhydrAMINE (BENADRYL) 25 MG tablet Take 25 mg by mouth every 6 (six) hours as needed for itching or allergies (congestion).    Yes Historical Provider, MD  doxercalciferol (HECTOROL) 4 MCG/2ML injection Inject 0.5 mLs (1 mcg total) into the vein every Monday, Wednesday, and Friday with hemodialysis. 08/24/14  Yes [name], DO  ferric gluconate 125 mg in sodium chloride 0.9 % 100 mL Inject 125 mg into the vein every Monday, Wednesday, and Friday with hemodialysis. 08/24/14  Yes [name], DO  guaiFENesin (MUCINEX) 600 MG 12 hr tablet Take 1 tablet (600 mg total) by mouth 2 (two) times daily as needed (congestion). 03/04/15  Yes Dixie Dials, MD  Multiple Vitamin (MULTIVITAMIN WITH MINERALS) TABS tablet Take 1 tablet by mouth daily.   Yes Historical Provider, MD  SPIRIVA RESPIMAT 2.5 MCG/ACT AERS INHALE 2 PUFFS INTO THE LUNGS EVERY MORNING 08/03/15  Yes [name], MD  ciprofloxacin (CIPRO) 250 MG tablet Take 1 tablet (250 mg total) by mouth 2 (two) times daily. Patient not taking: Reported on 09/13/2015 03/04/15   Dixie Dials, MD  diltiazem (CARDIZEM LA) 120 MG 24 hr tablet Take 1 tablet (120 mg total) by mouth daily. Patient not taking: Reported on 09/13/2015 03/04/15   Dixie Dials, MD  ipratropium-albuterol (DUONEB) 0.5-2.5 (3) MG/3ML SOLN Take 3 mLs by nebulization every 4 (four) hours as needed (shortness of breath/wheezing). Patient not taking: Reported on 09/13/2015 03/04/15   Dixie Dials, MD  multivitamin (RENA-VIT) TABS tablet Take 1  tablet by mouth at bedtime. Patient not taking: Reported on 09/13/2015 09/15/14   [name], MD  Nutritional Supplements (FEEDING SUPPLEMENT, NEPRO CARB STEADY,) LIQD Take 237 mLs by mouth 2 (two) times daily between meals. Patient not taking: Reported on 02/16/2015 01/14/15   Belkys A Regalado, MD  pantoprazole (PROTONIX) 40 MG tablet Take 1 tablet (40 mg total) by mouth 2 (two) times daily. Patient not taking: Reported on 02/16/2015 01/14/15   Belkys A Regalado, MD  ranitidine (ZANTAC) 150 MG tablet Take 1 tablet (150 mg total) by mouth 2 (two) times daily. Patient not taking: Reported on 09/13/2015 02/16/15   [name], MD   Liver Function Tests  Recent Labs Lab 09/13/15 0657  AST 121*  ALT  111*  ALKPHOS 53  BILITOT 0.8  PROT 6.2*  ALBUMIN 2.8*   No results for input(s): LIPASE, AMYLASE in the last 168 hours. CBC  Recent Labs Lab 09/13/15 0657 09/13/15 0705  WBC 9.1  --   NEUTROABS 8.2*  --   HGB 8.1* 9.2*  HCT 26.3* 27.0*  MCV 100.8*  --   PLT 75*  --    Basic Metabolic Panel  Recent Labs Lab 09/13/15 0657 09/13/15 0705 09/13/15 0731  NA 134* 136  --   K 6.3* 6.2*  --   CL 96* 102  --   CO2 21*  --   --   GLUCOSE 154* 150*  --   BUN 39* 45*  --   CREATININE 10.40* 9.90*  --   CALCIUM 9.3  --   --   PHOS  --   --  7.9*    Filed Vitals:   09/13/15 0900 09/13/15 0930 09/13/15 0945 09/13/15 1037  BP: 162/60 191/67 182/60 176/62  Pulse: 60 76 68 69  Temp:      TempSrc:      Resp: 14 27 14 14   Height:      Weight:      SpO2: 100% 100% 99% 100%   Exam Intubated, unresponsive No rash, cyanosis or gangrene Sclera anicteric, throat clear No jvd Chest coarse BS bilat  RRR slightly tachy no RG Abd slight distended, tympanitic +BS no ascites GU normal male Ext 1+ LLE edema, no RLE edema LUA AVF +bruit Neuro is not responding    MWF [place]  4h   65.5kg  2/2.25 bath  Heparin none  LUA AVF Hect 7 ug Venofer 50/ wk Mircera 200 every 2  wks  Assessment: 1 Cardiac arrest - resuscitated 2 ESRD HD mwf 3 SOB looks like RLL PNA/ effusion, no clear edema on CXR 4 Vol is up 7kg if wts are accurate 5 HTN on diltiazem and possibly MTP, BP's are high 6 COPD 7 Blind d/t RP 8 Hyperkalemia   Plan- HD today, UF as BP tolerates, keep SBP > 120  Rob Hava Massingale MD (pgr) [phone]    (c) [phone] 09/13/2015, 11:00 AM

## 2015-09-13 NOTE — Code Documentation (Addendum)
Pt arrived by ems for post cpr, complained of sob x 24 hours and ems arrived apneic and soon lost pulses, performed cpr for approx 15 mins and regained pulses, given calcium, bicarb, solumedrol, versed and breifly received an epi gtt, on arrival here bvm noted pulses present. Pt also given mag 2 gm and duoneb, pt is a dialysis pt last treatment Friday, due today for dialysis. Crackles noted through out lungs.

## 2015-09-13 NOTE — Progress Notes (Signed)
Notified Dr Chase Caller in [place] of shivering. PRN sedation medication given with no relief. Tylenol orders received and will closely monitor for relief. EKG obtained due to ST depression on bedside monitor. Notified MD of this also.

## 2015-09-13 NOTE — Progress Notes (Signed)
eLink Physician-Brief Progress Note Patient Name: Cameron Mendez DOB: September 14, 1946 MRN: [id number]   Date of Service  09/13/2015  HPI/Events of Note  Shivering on [place] . D/w Dr Ashok Cordia - possibly trying to spike temp  eICU Interventions   Tylenol trial  Anti-infectives    Start     Dose/Rate Route Frequency Ordered Stop   09/13/15 2200  piperacillin-tazobactam (ZOSYN) IVPB 2.25 g     2.25 g 100 mL/hr over 30 Minutes Intravenous Every 8 hours 09/13/15 1008     09/13/15 1015  piperacillin-tazobactam (ZOSYN) IVPB 3.375 g     3.375 g 100 mL/hr over 30 Minutes Intravenous  Once 09/13/15 1005 09/13/15 1417          Intervention Category Evaluation Type: Other  Paige Vanderwoude 09/13/2015, 5:03 PM

## 2015-09-13 NOTE — Progress Notes (Signed)
EEG Completed; Results Pending  

## 2015-09-13 NOTE — H&P (Signed)
PULMONARY / CRITICAL CARE MEDICINE   Name: Cameron Mendez MRN: [id number] DOB: [birth year]    ADMISSION DATE:  09/13/2015 CONSULTATION DATE:  09/13/15  REFERRING MD :  Dr. Stark Jock   CHIEF COMPLAINT:  Cardiac Arrest   INITIAL PRESENTATION: 69 y/o M with PMH of ESRD, dCHF, COPD, R pleural effusion, and GIB who presented to [facility] on 10/3 after suffering a cardiac arrest.    STUDIES:  10/03  CT Head >> neg for acute abnormality, R VP shunt 10/03  CT Chest w/o >> chronic loculated R effusion mildly improved since 12/2014, compressive atelectasis vs aspiration, no fx's  SIGNIFICANT EVENTS: 10/03  Admit after cardiac arrest, suspected respiratory leading to cardiac   HISTORY OF PRESENT ILLNESS: 69 y/o M, current smoker,  with a PMH of retinitis pigmentosa with resultant blindness, VP shunt, arthritis, HTN, CKD IV on HD MWF, BPH, pericardial effusion, colon polyps, prior GIB in 01/2015 (negative malignancy work up at that time(), PAF (not on anti-coagulation), and COPD (followed by Dr. Lamonte Sakai) who presented to [facility] on 10/3 after suffering an in home cardiac arrest.    Information obtained from staff and family at bedside.  Wife reports patient has complained of shortness of breath over the past three days prior to admit.  He missed dialysis on Wed 9/28 as the bus did not pick him up.  He did complete HD on Friday 9/30.  His wife reports he was given a breathing treatment during HD.  He woke the am of admission and went to the restroom.  His wife reported he asked her to turn the air down as he was hot.  He then walked to the living room and collapsed.  His wife called EMS and is not clear if he lost consciousness.  On EMS arrival, the patient was noted to be asystolic requiring CPR (? Approximately 10 minutes per ER staff) with ROSC.  The patient was intubated in the ER on arrival.  PCCM consulted for further evaluation.   PAST MEDICAL HISTORY :   has a past medical history of Anemia; Diastolic dysfunction;  Retinitis pigmentosa; Arthritis; Hypertension; CKD (chronic kidney disease), stage IV (HCC); BPH (benign prostatic hyperplasia); Blind; Gangrene of finger ([place]); Colon polyps; Pericardial effusion; CHF (congestive heart failure) ([place]); COPD (chronic obstructive pulmonary disease) ([place]); GI (gastrointestinal bleed) (feb 2016); and Irregular heart rate (03/03/2015).  has past surgical history that includes Cataract extraction w/ intraocular lens  implant, bilateral (Bilateral); Knee ligament reconstruction (Left); AV fistula placement (07/15/2012); Amputation (12/30/2012); Pericardiocentesis (09/2013); and pericardial tap (N/A, 09/19/2013).   Prior to Admission medications   Medication Sig Start Date End Date Taking? Authorizing Provider  acetaminophen (TYLENOL) 500 MG tablet Take 1,000 mg by mouth every 6 (six) hours as needed for moderate pain.   Yes Historical Provider, MD  albuterol (PROVENTIL HFA;VENTOLIN HFA) 108 (90 BASE) MCG/ACT inhaler Inhale 1-2 puffs into the lungs every 6 (six) hours as needed for wheezing or shortness of breath.   Yes Historical Provider, MD  allopurinol (ZYLOPRIM) 100 MG tablet Take 100 mg by mouth daily.    Yes Historical Provider, MD  carvedilol (COREG) 25 MG tablet Take 1 tablet (25 mg total) by mouth 2 (two) times daily with a meal. 01/14/15  Yes Belkys A Regalado, MD  diltiazem (CARDIZEM LA) 120 MG 24 hr tablet Take 1 tablet (120 mg total) by mouth daily. 03/04/15  Yes Dixie Dials, MD  diphenhydrAMINE (BENADRYL) 25 MG tablet Take 25 mg by mouth every 6 (six)  hours as needed for itching or allergies (congestion).    Yes Historical Provider, MD  doxercalciferol (HECTOROL) 4 MCG/2ML injection Inject 0.5 mLs (1 mcg total) into the vein every Monday, Wednesday, and Friday with hemodialysis. 08/24/14  Yes [name], DO  ferric gluconate 125 mg in sodium chloride 0.9 % 100 mL Inject 125 mg into the vein every Monday, Wednesday, and Friday with hemodialysis. 08/24/14  Yes [name], DO  guaiFENesin (MUCINEX) 600 MG 12 hr tablet Take 1 tablet (600 mg total) by mouth 2 (two) times daily as needed (congestion). 03/04/15  Yes Dixie Dials, MD  Multiple Vitamin (MULTIVITAMIN WITH MINERALS) TABS tablet Take 1 tablet by mouth daily.   Yes Historical Provider, MD  SPIRIVA RESPIMAT 2.5 MCG/ACT AERS INHALE 2 PUFFS INTO THE LUNGS EVERY MORNING 08/03/15  Yes [name], MD  ciprofloxacin (CIPRO) 250 MG tablet Take 1 tablet (250 mg total) by mouth 2 (two) times daily. Patient not taking: Reported on 09/13/2015 03/04/15   Dixie Dials, MD  ipratropium-albuterol (DUONEB) 0.5-2.5 (3) MG/3ML SOLN Take 3 mLs by nebulization every 4 (four) hours as needed (shortness of breath/wheezing). Patient not taking: Reported on 09/13/2015 03/04/15   Dixie Dials, MD  multivitamin (RENA-VIT) TABS tablet Take 1 tablet by mouth at bedtime. Patient not taking: Reported on 09/13/2015 09/15/14   [name], MD  Nutritional Supplements (FEEDING SUPPLEMENT, NEPRO CARB STEADY,) LIQD Take 237 mLs by mouth 2 (two) times daily between meals. Patient not taking: Reported on 02/16/2015 01/14/15   Belkys A Regalado, MD  pantoprazole (PROTONIX) 40 MG tablet Take 1 tablet (40 mg total) by mouth 2 (two) times daily. Patient not taking: Reported on 02/16/2015 01/14/15   Belkys A Regalado, MD  ranitidine (ZANTAC) 150 MG tablet Take 1 tablet (150 mg total) by mouth 2 (two) times daily. Patient not taking: Reported on 09/13/2015 02/16/15   [name], MD   Allergies  Allergen Reactions  . Ibuprofen Other (See Comments)    Kidney problems-dialysis patient  . Nsaids Other (See Comments)    Kidney problems-dialysis patient    FAMILY HISTORY:  indicated that his mother is deceased. He indicated that his father is deceased.    SOCIAL HISTORY:  reports that he has been smoking Cigarettes.  He has a 25 pack-year smoking history. He has never used smokeless tobacco. He reports that he does not drink alcohol or use illicit  drugs.  REVIEW OF SYSTEMS:  Unable to complete as patient is altered on mechanical ventilation.   SUBJECTIVE:   VITAL SIGNS: Temp:  [96.9 F (36.1 C)] 96.9 F (36.1 C) (10/03 0644) Pulse Rate:  [59-82] 59 (10/03 0800) Resp:  [14-20] 20 (10/03 0800) BP: (78-154)/(38-55) 154/54 mmHg (10/03 0800) SpO2:  [97 %-100 %] 100 % (10/03 0800) FiO2 (%):  [100 %] 100 % (10/03 0650) Weight:  [160 lb (72.576 kg)] 160 lb (72.576 kg) (10/03 0644)   HEMODYNAMICS:     VENTILATOR SETTINGS: Vent Mode:  [-] PRVC FiO2 (%):  [100 %] 100 % Set Rate:  [20 bmp] 20 bmp Vt Set:  [500 mL] 500 mL PEEP:  [5 cmH20] 5 cmH20 Plateau Pressure:  [29 cmH20] 29 cmH20   INTAKE / OUTPUT: No intake or output data in the 24 hours ending 09/13/15 0829  PHYSICAL EXAMINATION: General:  Chronically ill adult male in NAD Neuro:  Obtunded on vent HEENT:  OETT, mm pink/moist, no jvd  Cardiovascular:  s1s2 rrr, no m/r/g Lungs:  Even/non-labored, on  vent, lungs bilaterally coarse with wheezing Abdomen:  Obese/soft,bsx4 active  Musculoskeletal:  No acute deformities  Skin:  Warm/dry, trace pitting edema, BLE scaling   LABS:  CBC  Recent Labs Lab 09/13/15 0657 09/13/15 0705  WBC 9.1  --   HGB 8.1* 9.2*  HCT 26.3* 27.0*  PLT 75*  --    Coag's  Recent Labs Lab 09/13/15 0731  APTT 49*  INR 1.24   BMET  Recent Labs Lab 09/13/15 0657 09/13/15 0705  NA 134* 136  K 6.3* 6.2*  CL 96* 102  CO2 21*  --   BUN 39* 45*  CREATININE 10.40* 9.90*  GLUCOSE 154* 150*   Electrolytes  Recent Labs Lab 09/13/15 0657 09/13/15 0731  CALCIUM 9.3  --   MG  --  3.1*  PHOS  --  7.9*   Sepsis Markers  Recent Labs Lab 09/13/15 0706  LATICACIDVEN 6.98*   ABG  Recent Labs Lab 09/13/15 0824  PHART 7.540*  PCO2ART 30.8*  PO2ART 140.0*   Liver Enzymes  Recent Labs Lab 09/13/15 0657  AST 121*  ALT 111*  ALKPHOS 53  BILITOT 0.8  ALBUMIN 2.8*   Cardiac Enzymes No results for input(s):  TROPONINI, PROBNP in the last 168 hours.   Glucose No results for input(s): GLUCAP in the last 168 hours.  Imaging Dg Chest Portable 1 View  09/13/2015   CLINICAL DATA:  Intubated post CPR  EXAM: PORTABLE CHEST 1 VIEW  COMPARISON:  01/11/2015 chest radiograph  FINDINGS: Endotracheal tube tip is 1.8 cm above the carina. Enteric tube enters the stomach, with the tip not seen on this image. Pacer pad overlies the left lower chest. Right VP shunt catheter courses inferiorly from the right neck into the cardiac silhouette, with the inferior portion of the VP shunt catheter not well visualized on this image. Stable cardiomediastinal silhouette with mild cardiomegaly. No pneumothorax. Small moderate right pleural effusion, increased. No left pleural effusion. There worsening linear and patchy parahilar opacities. There is increased medial right upper lung opacity and right basilar patchy opacity.  IMPRESSION: 1. Endotracheal tube tip is 1.8 cm above the carina. 2. Small moderate right pleural effusion, increased. 3. Stable mild cardiomegaly. Worsening linear and patchy parahilar opacities, and increased medial right upper lung and right basilar patchy opacity. Findings likely represent a combination of worsening atelectasis and pulmonary edema.   Electronically Signed   By: Ilona Sorrel M.D.   On: 09/13/2015 07:50     ASSESSMENT / PLAN:  PULMONARY OETT 10/3 >>  A: Acute Respiratory Failure - suspect respiratory leading to cardiac arrest RLL Atelectasis - vs aspiration, r/o PNA Right Loculated Pleural Effusion - hx or, thora in 01/2015 with negative cytology, exudative effusion.  Was noted to be partially loculated in 12/2014 COPD - followed by Dr. Lamonte Sakai  Tobacco Abuse  P:    Assess CT Chest to review R pleural space MV support, 8cc/kg Wean PEEP/FiO2 for sats >92% Duoneb Q6 with PRN albuterol   CARDIOVASCULAR CVL R IJ 10/3 >>  A:  Hx HTN, chronic diastolic dysfunction/CHF, PAF P:  ICU admit,  monitoring  Assess EKG, ECHO Trend Troponin, BNP Normothermia protocol  ASA  Continue coreg @ 12.5 mg BID - reduced dose from home of 25 BID Cardizem 60 mg BID (home dose)  RENAL A:   ESRD - on HD MWF Hyperkalemia  Lactic Acidosis  P:   Nephrology consult Trend BMP Q12  Replace electrolytes as indicated   GASTROINTESTINAL A:  Hx GIB, colon polyps P:   NPO  OGT  PPI for SUP  If remains intubated in am, consider TF  HEMATOLOGIC A:   Anemia of Chronic Disease  Thrombocyopenia - appears chronic, baseline ~ 95-115 P:  Trend CBC Heparin sq for DVT prophylaxis   INFECTIOUS A:  RLL ATX, R/O PNA P:   BCx2 10/3 >>  Sputum 10/3 >>  Follow temp curve / WBC Assess PCT protocol   Zosyn (empiric pulmonary), start date 10/3, day 1/x  ENDOCRINE A:   Hyperglycemia   P:   Monitor glucose on BMP   NEUROLOGIC A:   Acute Encephalopathy - in the setting of cardiac arrest, r/o neurologic event  Retinitis Pigmentosa / Blindness Myoclonic Movement - noted in face in ER P:   RASS goal: -1 PRN sedation only, allow to wake to assess neuro status CT Head to r/o bleed Assess EEG to rule out seizure activity    FAMILY  - Updates: wife and son-in-law updated at bedside.    - Inter-disciplinary family meet or Palliative Care meeting due by:   10/10       [name], NP-C [place] Pgr: [phone] or if no answer [phone] 09/13/2015, 8:29 AM

## 2015-09-13 NOTE — ED Provider Notes (Signed)
CSN: [id number]     Arrival date & time 09/13/15  0630 History   First MD Initiated Contact with Patient 09/13/15 [phone]     Chief Complaint  Patient presents with  . Cardiac Arrest     (Consider location/radiation/quality/duration/timing/severity/associated sxs/prior Treatment) HPI Comments: Patient is a 69 year old male with history of end-stage renal disease on hemodialysis, atrial fibrillation, COPD. He is brought by EMS for evaluation of cardiac arrest. He was last dialyzed on Friday, and began feeling short of breath yesterday. This has progressed over the past 24 hours. It became much worse this morning and EMS was called. Shortly after EMS arrival, he became unresponsive and went into cardiac arrest. CPR was immediately initiated and return of spontaneous circulation was achieved. An attempted intubation was made, however was unsuccessful in the field. He was brought here being ventilated by bag valve mask.  The history is provided by the patient.    Past Medical History  Diagnosis Date  . Anemia   . Diastolic dysfunction   . Retinitis pigmentosa     Blindness--has shunt --placed yrs ago in [place]..  . Arthritis     Gout  . Hypertension     dx--"long time"  . CKD (chronic kidney disease), stage IV ([place])     a. L upper extremity AV fistula created 07/2012.  [name] BPH (benign prostatic hyperplasia)   . Blind   . Gangrene of finger ([place])     a. 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  . Colon polyps   . Pericardial effusion     a. Noted on echo 10/2009. b. Again seen on echo 09/2013.  [name] CHF (congestive heart failure) ([place])   . COPD (chronic obstructive pulmonary disease) ([place])   . GI (gastrointestinal bleed) feb 2016  . Irregular heart rate 03/03/2015   Past Surgical History  Procedure Laterality Date  . Cataract extraction w/ intraocular lens  implant, bilateral Bilateral   . Knee ligament reconstruction Left   . Av fistula placement  07/15/2012    Procedure:  ARTERIOVENOUS (AV) FISTULA CREATION;  Surgeon: Rosetta Posner, MD;  Location: [facility];  Service: Vascular;  Laterality: Left;  . Amputation  12/30/2012    Procedure: AMPUTATION DIGIT;  Surgeon: Tennis Must, MD;  Location: [facility];  Service: Orthopedics;  Laterality: Left;  LEFT SMALL FINGER AMPUTATION  . Pericardiocentesis  09/2013  . Pericardial tap N/A 09/19/2013    Procedure: PERICARDIAL TAP;  Surgeon: Blane Ohara, MD;  Location: [facility] CATH LAB;  Service: Cardiovascular;  Laterality: N/A;   Family History  Problem Relation Age of Onset  . Ovarian cancer Mother   . Kidney disease Neg Hx   . Gallbladder disease Neg Hx   . Esophageal cancer Neg Hx   . Heart disease Neg Hx   . Stomach cancer Neg Hx   . Colon polyps Brother   . Colon cancer Brother    Social History  Substance Use Topics  . Smoking status: Current Every Day Smoker -- 0.50 packs/day for 50 years    Types: Cigarettes  . Smokeless tobacco: Never Used     Comment: Pt given handout on how to quit smoking 12/31/14  . Alcohol Use: No     Comment: "quit driinking in ~ 2000"    Review of Systems  Unable to perform ROS     Allergies  Ibuprofen and Nsaids  Home Medications   Prior to Admission medications   Medication Sig Start Date End  Date Taking? Authorizing Provider  acetaminophen (TYLENOL) 500 MG tablet Take 1,000 mg by mouth every 6 (six) hours as needed for moderate pain.    Historical Provider, MD  allopurinol (ZYLOPRIM) 100 MG tablet Take 100 mg by mouth daily.     Historical Provider, MD  carvedilol (COREG) 25 MG tablet Take 1 tablet (25 mg total) by mouth 2 (two) times daily with a meal. 01/14/15   Belkys A Regalado, MD  ciprofloxacin (CIPRO) 250 MG tablet Take 1 tablet (250 mg total) by mouth 2 (two) times daily. 03/04/15   Dixie Dials, MD  diltiazem (CARDIZEM LA) 120 MG 24 hr tablet Take 1 tablet (120 mg total) by mouth daily. 03/04/15   Dixie Dials, MD  diphenhydrAMINE (BENADRYL) 25 MG  tablet Take 25 mg by mouth every 6 (six) hours as needed for itching or allergies (congestion).     Historical Provider, MD  doxercalciferol (HECTOROL) 4 MCG/2ML injection Inject 0.5 mLs (1 mcg total) into the vein every Monday, Wednesday, and Friday with hemodialysis. 08/24/14   [name], DO  ferric gluconate 125 mg in sodium chloride 0.9 % 100 mL Inject 125 mg into the vein every Monday, Wednesday, and Friday with hemodialysis. 08/24/14   [name], DO  guaiFENesin (MUCINEX) 600 MG 12 hr tablet Take 1 tablet (600 mg total) by mouth 2 (two) times daily as needed (congestion). 03/04/15   Dixie Dials, MD  ipratropium-albuterol (DUONEB) 0.5-2.5 (3) MG/3ML SOLN Take 3 mLs by nebulization every 4 (four) hours as needed (shortness of breath/wheezing). 03/04/15   Dixie Dials, MD  multivitamin (RENA-VIT) TABS tablet Take 1 tablet by mouth at bedtime. 09/15/14   [name], MD  Nutritional Supplements (FEEDING SUPPLEMENT, NEPRO CARB STEADY,) LIQD Take 237 mLs by mouth 2 (two) times daily between meals. Patient not taking: Reported on 02/16/2015 01/14/15   Belkys A Regalado, MD  pantoprazole (PROTONIX) 40 MG tablet Take 1 tablet (40 mg total) by mouth 2 (two) times daily. Patient not taking: Reported on 02/16/2015 01/14/15   Belkys A Regalado, MD  ranitidine (ZANTAC) 150 MG tablet Take 1 tablet (150 mg total) by mouth 2 (two) times daily. 02/16/15   [name], MD  SPIRIVA RESPIMAT 2.5 MCG/ACT AERS INHALE 2 PUFFS INTO THE LUNGS EVERY MORNING 08/03/15   [name], MD   BP 120/44 mmHg  Pulse 78  Temp(Src) 96.9 F (36.1 C) (Temporal)  Resp 20  Ht 5\' 5"  (1.651 m)  Wt 160 lb (72.576 kg)  BMI 26.63 kg/m2  SpO2 100% Physical Exam  Constitutional: He appears well-developed and well-nourished.  HENT:  Head: Normocephalic and atraumatic.  Eyes: Pupils are equal, round, and reactive to light.  Neck: Normal range of motion.  Cardiovascular: Normal rate, regular rhythm and normal heart sounds.   No  murmur heard. Pulmonary/Chest:  Breath sounds are coarse bilaterally.  Abdominal: Soft. Bowel sounds are normal. He exhibits no distension. There is no tenderness.  Musculoskeletal: Normal range of motion. He exhibits no edema.  Neurological:  GCS is 3.  Skin: Skin is warm and dry.  Nursing note and vitals reviewed.   ED Course  Procedures (including critical care time) Labs Review Labs Reviewed  I-STAT CG4 LACTIC ACID, ED - Abnormal; Notable for the following:    Lactic Acid, Venous 6.98 (*)    All other components within normal limits  I-STAT CHEM 8, ED - Abnormal; Notable for the following:    Potassium 6.2 (*)    BUN  45 (*)    Creatinine, Ser 9.90 (*)    Glucose, Bld 150 (*)    Hemoglobin 9.2 (*)    HCT 27.0 (*)    All other components within normal limits  CBC WITH DIFFERENTIAL/PLATELET  COMPREHENSIVE METABOLIC PANEL  BRAIN NATRIURETIC PEPTIDE  BLOOD GAS, ARTERIAL  PROTIME-INR  APTT  MAGNESIUM  PHOSPHORUS  I-STAT TROPOININ, ED    Imaging Review No results found. I have personally reviewed and evaluated these images and lab results as part of my medical decision-making.   EKG Interpretation   Date/Time:  Monday September 13 2015 06:43:38 EDT Ventricular Rate:  82 PR Interval:  159 QRS Duration: 85 QT Interval:  393 QTC Calculation: 459 R Axis:   31 Text Interpretation:  Sinus rhythm Repol abnrm suggests ischemia, diffuse  leads Confirmed by Kadeem Hyle  MD, Merrisa Skorupski ([id number]) on 09/13/2015 7:16:26 AM      MDM   Final diagnoses:  None    Patient was brought here by EMS after becoming unresponsive at home after EMS was called. ROSC was achieved with CPR and ACLS medications in the field. He arrived here unresponsive with respirations being performed by bag-valve-mask. He arrived here with blood pressures over 160 systolic, heart rate in the 80s, and snoring respirations. His GCS was 3. RSI was performed using etomidate and succinylcholine and a 7.5 endotracheal tube  was easily placed using a #3 McIntosh blade. Tube placement was confirmed with direct visualization, end-tidal CO2, and auscultation of the stomach and lungs.  Laboratory studies were obtained through a femoral stick in the right groin as he has limited IV access and limited vasculature. I've spoken with Dr. Meda Coffee from critical care who will come to the ER and assume care.  CRITICAL CARE Performed by: Veryl Speak Total critical care time: 45 minutes Critical care time was exclusive of separately billable procedures and treating other patients. Critical care was necessary to treat or prevent imminent or life-threatening deterioration. Critical care was time spent personally by me on the following activities: development of treatment plan with patient and/or surrogate as well as nursing, discussions with consultants, evaluation of patient's response to treatment, examination of patient, obtaining history from patient or surrogate, ordering and performing treatments and interventions, ordering and review of laboratory studies, ordering and review of radiographic studies, pulse oximetry and re-evaluation of patient's condition.     Veryl Speak, MD 09/13/15 [phone]

## 2015-09-13 NOTE — Progress Notes (Signed)
   09/13/15 0800  Clinical Encounter Type  Visited With Patient and family together;Health care provider  Visit Type Critical Care;ED;Trauma  Referral From Nurse;Physician  Consult/Referral To Chaplain  Spiritual Encounters  Spiritual Needs Prayer;Emotional  Stress Factors  Family Stress Factors Loss of control   Chaplain responded to a Post CPR page in the ED.  Chaplain provided drinks for the family and stayed with the family while doctor undated.

## 2015-09-14 DIAGNOSIS — J189 Pneumonia, unspecified organism: Principal | ICD-10-CM

## 2015-09-14 LAB — GLUCOSE, CAPILLARY
GLUCOSE-CAPILLARY: 90 mg/dL (ref 65–99)
GLUCOSE-CAPILLARY: 109 mg/dL — AB (ref 65–99)
GLUCOSE-CAPILLARY: 114 mg/dL — AB (ref 65–99)
Glucose-Capillary: 99 mg/dL (ref 65–99)
Glucose-Capillary: 78 mg/dL (ref 65–99)
Glucose-Capillary: 66 mg/dL (ref 65–99)
Glucose-Capillary: 92 mg/dL (ref 65–99)
Glucose-Capillary: 113 mg/dL — ABNORMAL HIGH (ref 65–99)

## 2015-09-14 LAB — BASIC METABOLIC PANEL
ANION GAP: 15 (ref 5–15)
BUN: 24 mg/dL — ABNORMAL HIGH (ref 6–20)
CALCIUM: 8.7 mg/dL — AB (ref 8.9–10.3)
CHLORIDE: 93 mmol/L — AB (ref 101–111)
CO2: 28 mmol/L (ref 22–32)
Creatinine, Ser: 6.35 mg/dL — ABNORMAL HIGH (ref 0.61–1.24)
GFR calc non Af Amer: 8 mL/min — ABNORMAL LOW (ref >60)
GFR, EST AFRICAN AMERICAN: 9 mL/min — AB (ref >60)
Glucose, Bld: 97 mg/dL (ref 65–99)
Potassium: 3.9 mmol/L (ref 3.5–5.1)
SODIUM: 136 mmol/L (ref 135–145)

## 2015-09-14 LAB — URINE CULTURE
Culture: NO GROWTH
Special Requests: NORMAL

## 2015-09-14 LAB — BLOOD GAS, ARTERIAL
ACID-BASE EXCESS: 2.2 mmol/L — AB (ref 0.0–2.0)
Bicarbonate: 26 mEq/L — ABNORMAL HIGH (ref 20.0–24.0)
Drawn by: 418751
FIO2: 0.6
MECHVT: 500 mL
O2 Saturation: 98.5 %
PEEP/CPAP: 5 cmH2O
PO2 ART: 126 mmHg — AB (ref 80.0–100.0)
Patient temperature: 97.3
RATE: 14 resp/min
TCO2: 27.2 mmol/L (ref 0–100)
pCO2 arterial: 37.6 mmHg (ref 35.0–45.0)
pH, Arterial: 7.451 — ABNORMAL HIGH (ref 7.350–7.450)

## 2015-09-14 LAB — CBC
HEMATOCRIT: 28 % — AB (ref 39.0–52.0)
HEMOGLOBIN: 8.7 g/dL — AB (ref 13.0–17.0)
MCH: 30.2 pg (ref 26.0–34.0)
MCHC: 31.1 g/dL (ref 30.0–36.0)
MCV: 97.2 fL (ref 78.0–100.0)
Platelets: 71 10*3/uL — ABNORMAL LOW (ref 150–400)
RBC: 2.88 MIL/uL — ABNORMAL LOW (ref 4.22–5.81)
RDW: 17.3 % — AB (ref 11.5–15.5)
WBC: 6.7 10*3/uL (ref 4.0–10.5)

## 2015-09-14 LAB — MAGNESIUM: MAGNESIUM: 2.5 mg/dL — AB (ref 1.7–2.4)

## 2015-09-14 LAB — PROCALCITONIN: Procalcitonin: 30.19 ng/mL

## 2015-09-14 LAB — TROPONIN I: TROPONIN I: 0.09 ng/mL — AB (ref <0.031)

## 2015-09-14 LAB — PHOSPHORUS: PHOSPHORUS: 4.8 mg/dL — AB (ref 2.5–4.6)

## 2015-09-14 MED ORDER — ACETAMINOPHEN 160 MG/5ML PO SOLN: 650.0000 mg | Freq: Four times a day (QID) | ORAL | Status: DC | PRN

## 2015-09-14 MED ORDER — PANTOPRAZOLE SODIUM 40 MG PO PACK
40.0000 mg | PACK | ORAL | Status: DC
  Administered 2015-09-14 – 2015-09-21 (×8): 40 mg
  Filled 2015-09-14 (×8): qty 20

## 2015-09-14 MED ORDER — DEXTROSE 50 % IV SOLN
INTRAVENOUS | Status: AC
  Administered 2015-09-14: 25 mL
  Filled 2015-09-14: qty 50

## 2015-09-14 MED ORDER — INSULIN ASPART 100 UNIT/ML ~~LOC~~ SOLN
0.0000 [IU] | SUBCUTANEOUS | Status: DC
  Administered 2015-09-15 – 2015-09-19 (×4): 1 [IU] via SUBCUTANEOUS

## 2015-09-14 MED ORDER — FENTANYL BOLUS VIA INFUSION
25.0000 ug | INTRAVENOUS | Status: DC | PRN
  Filled 2015-09-14: qty 25

## 2015-09-14 MED ORDER — BISACODYL 10 MG RE SUPP: 10.0000 mg | Freq: Every day | RECTAL | Status: DC | PRN

## 2015-09-14 MED ORDER — METHYLPREDNISOLONE SODIUM SUCC 40 MG IJ SOLR
40.0000 mg | Freq: Three times a day (TID) | INTRAMUSCULAR | Status: DC
  Administered 2015-09-14 – 2015-09-16 (×7): 40 mg via INTRAVENOUS
  Filled 2015-09-14 (×8): qty 1

## 2015-09-14 MED ORDER — DILTIAZEM 12 MG/ML ORAL SUSPENSION
60.0000 mg | Freq: Two times a day (BID) | ORAL | Status: DC
  Administered 2015-09-14 – 2015-09-18 (×10): 60 mg
  Filled 2015-09-14 (×13): qty 6

## 2015-09-14 MED ORDER — NEPRO/CARBSTEADY PO LIQD
1000.0000 mL | ORAL | Status: DC
  Administered 2015-09-14 – 2015-09-15 (×2): 1000 mL
  Filled 2015-09-14 (×4): qty 1000

## 2015-09-14 MED ORDER — MIDAZOLAM HCL 2 MG/2ML IJ SOLN
1.0000 mg | INTRAMUSCULAR | Status: DC | PRN
  Administered 2015-09-15: 1 mg via INTRAVENOUS
  Filled 2015-09-14: qty 2

## 2015-09-14 MED ORDER — CARVEDILOL 12.5 MG PO TABS
12.5000 mg | ORAL_TABLET | Freq: Two times a day (BID) | ORAL | Status: DC
  Administered 2015-09-14 – 2015-09-18 (×7): 12.5 mg
  Filled 2015-09-14 (×8): qty 1

## 2015-09-14 MED ORDER — SENNOSIDES 8.8 MG/5ML PO SYRP
5.0000 mL | ORAL_SOLUTION | Freq: Two times a day (BID) | ORAL | Status: DC | PRN
  Filled 2015-09-14: qty 5

## 2015-09-14 MED ORDER — SODIUM CHLORIDE 0.9 % IV SOLN
25.0000 ug/h | INTRAVENOUS | Status: DC
  Administered 2015-09-14: 50 ug/h via INTRAVENOUS
  Filled 2015-09-14 (×2): qty 50

## 2015-09-14 MED ORDER — VITAL HIGH PROTEIN PO LIQD
1000.0000 mL | ORAL | Status: DC
  Administered 2015-09-14: 15:00:00
  Filled 2015-09-14 (×2): qty 1000

## 2015-09-14 MED ORDER — INFLUENZA VAC SPLIT QUAD 0.5 ML IM SUSY
0.5000 mL | PREFILLED_SYRINGE | INTRAMUSCULAR | Status: AC
  Administered 2015-09-18: 0.5 mL via INTRAMUSCULAR
  Filled 2015-09-14 (×2): qty 0.5

## 2015-09-14 MED ORDER — PRO-STAT SUGAR FREE PO LIQD
60.0000 mL | Freq: Two times a day (BID) | ORAL | Status: DC
  Administered 2015-09-14 – 2015-09-15 (×4): 60 mL
  Filled 2015-09-14 (×4): qty 60

## 2015-09-14 NOTE — Progress Notes (Signed)
Initial Nutrition Assessment  INTERVENTION:  Initiate Nepro @ 25 ml/hr via OG tube   60 ml Prostat BID.    Tube feeding regimen provides 1480 kcal (99% of needs), 108 grams of protein, and 785 ml of H2O.   NUTRITION DIAGNOSIS:   Inadequate oral intake related to inability to eat as evidenced by NPO status.  GOAL:   Patient will meet greater than or equal to 90% of their needs  MONITOR:   TF tolerance, I & O's, Labs, Skin, Weight trends, Vent status  REASON FOR ASSESSMENT:   Consult Enteral/tube feeding initiation and management  ASSESSMENT:   69 yo male with hx of COPD, ESRD on HD MWF admitted after acute respiratory failure with cardiac arrest due to HCAP. Started on normotherapy.   Patient is currently intubated on ventilator support MV: 8.1 L/min Temp (24hrs), Avg:96.9 F (36.1 C), Min:95.5 F (35.3 C), Max:97.5 F (36.4 C)  Labs: BUN/Cr, phosphorus, magnesium elevated  Diet Order:    NPO  Skin:  Reviewed, no issues  Last BM:  unknown  Height:   Ht Readings from Last 1 Encounters:  09/13/15 5\' 2"  (1.575 m)   Weight:   Wt Readings from Last 1 Encounters:  09/14/15 149 lb 4 oz (67.7 kg)   Ideal Body Weight:  53.6 kg  BMI:  Body mass index is 27.29 kg/(m^2).  Estimated Nutritional Needs:   Kcal:  1491  Protein:  90-105 grams  Fluid:  >/= 1.2 L/day  EDUCATION NEEDS:   No education needs identified at this time  [place], [place], [place] Pager [phone] After Hours Pager

## 2015-09-14 NOTE — Progress Notes (Signed)
PULMONARY / CRITICAL CARE MEDICINE   Name: Cameron Mendez MRN: [id number] DOB: [birth year]    ADMISSION DATE:  09/13/2015  REFERRING MD :  ER  CHIEF COMPLAINT:  Cardiac arrest  INITIAL PRESENTATION:  69 yo male with dyspnea, apnea, respiratory leading to cardiac arrest.  STUDIES:  10/3 CT head >> Rt VP shunt, chronic small vessel ischemic white matter changes, old b/l basal ganglia lacunar infarcts 10/3 CT chest >> Loculated Rt effusion, Atx 10/3 EEG >> no epileptiform activity 10/3 Echo >> mod LVH, EF 60 to 27%, grade 2 diastolic dysfx, mild MR, PAS 53 mmHg  SIGNIFICANT EVENTS: 10/3 Admit, nephrology consulted, normothermia protocol  SUBJECTIVE:  Started Abx last night  VITAL SIGNS: Temp:  [95.5 F (35.3 C)-97.5 F (36.4 C)] 97.2 F (36.2 C) (10/04 0800) Pulse Rate:  [62-101] 99 (10/04 0811) Resp:  [12-33] 33 (10/04 0811) BP: (94-243)/(23-119) 227/54 mmHg (10/04 0800) SpO2:  [71 %-100 %] 100 % (10/04 0800) FiO2 (%):  [60 %] 60 % (10/04 0811) Weight:  [130 lb 4.7 oz (59.1 kg)] 130 lb 4.7 oz (59.1 kg) (10/03 1145) HEMODYNAMICS: CVP:  [9 mmHg] 9 mmHg VENTILATOR SETTINGS: Vent Mode:  [-] PRVC FiO2 (%):  [60 %] 60 % Set Rate:  [14 bmp] 14 bmp Vt Set:  [500 mL] 500 mL PEEP:  [5 cmH20] 5 cmH20 Plateau Pressure:  [20 cmH20-31 cmH20] 26 cmH20 INTAKE / OUTPUT:  Intake/Output Summary (Last 24 hours) at 09/14/15 0924 Last data filed at 09/14/15 0800  Gross per 24 hour  Intake    225 ml  Output   3804 ml  Net  -3579 ml    PHYSICAL EXAMINATION: General: ill appearing Neuro:  Opens eyes with stimulation HEENT:  Blind, ETT in place Cardiovascular:  regular Lungs:  B/l crackles Abdomen:  Soft, non tender Musculoskeletal:  1+ edema, Lt arm AV fistula Skin:  No rashes  LABS:  CBC  Recent Labs Lab 09/13/15 0657 09/13/15 0705 09/14/15 0335  WBC 9.1  --  6.7  HGB 8.1* 9.2* 8.7*  HCT 26.3* 27.0* 28.0*  PLT 75*  --  71*   Coag's  Recent Labs Lab  09/13/15 0731 09/13/15 1030  APTT 49* 40*  INR 1.24 1.19   BMET  Recent Labs Lab 09/13/15 1030 09/13/15 2130 09/14/15 0335  NA 139 134* 136  K 5.0 3.7 3.9  CL 100* 92* 93*  CO2 25 25 28   BUN 42* 18 24*  CREATININE 10.47* 4.37* 6.35*  GLUCOSE 165* 112* 97   Electrolytes  Recent Labs Lab 09/13/15 0731 09/13/15 1030 09/13/15 2130 09/14/15 0335  CALCIUM  --  9.4 9.3 8.7*  MG 3.1*  --   --  2.5*  PHOS 7.9*  --   --  4.8*   Sepsis Markers  Recent Labs Lab 09/13/15 0706 09/13/15 1039 09/13/15 1208 09/14/15 0335  LATICACIDVEN 6.98* 1.04  --   --   PROCALCITON  --   --  7.74 30.19   ABG  Recent Labs Lab 09/13/15 0824 09/14/15 0420  PHART 7.540* 7.451*  PCO2ART 30.8* 37.6  PO2ART 140.0* 126*   Liver Enzymes  Recent Labs Lab 09/13/15 0657  AST 121*  ALT 111*  ALKPHOS 53  BILITOT 0.8  ALBUMIN 2.8*   Cardiac Enzymes  Recent Labs Lab 09/13/15 1535 09/13/15 2130 09/14/15 0335  TROPONINI 0.10* 0.13* 0.09*   Glucose  Recent Labs Lab 09/13/15 1159 09/13/15 1542 09/13/15 2009 09/13/15 2310 09/14/15 0335  GLUCAP 144* 190* 90 109*  99    Imaging Ct Head Wo Contrast  09/13/2015   CLINICAL DATA:  Cardiac arrest with loss of consciousness.  EXAM: CT HEAD WITHOUT CONTRAST  TECHNIQUE: Contiguous axial images were obtained from the base of the skull through the vertex without intravenous contrast.  COMPARISON:  None.  FINDINGS: Right occipital approach ventricular shunt terminates in the posterior body of the right lateral ventricle. There cerebral ventricle sizes appear within normal limits, with no dilatation of the temporal horns of the lateral ventricles.  No evidence of parenchymal hemorrhage or extra-axial fluid collection. Symmetric prominence of the extra-axial spaces along the frontal convexities represents increased CSF space due to cerebral atrophy (bridging veins are seen crossing the space). No mass lesion, mass effect, or midline shift. No CT  evidence of acute infarction. There is prominent diffuse cerebral volume loss. There is atherosclerotic intracranial arterial calcification. Nonspecific prominent subcortical and periventricular white matter hypodensity, most in keeping with chronic small vessel ischemic change. Small hypodensities in the bilateral basal ganglia likely represent old lacunar infarcts.  Mucosal thickening and partial opacification of the maxillary sinuses and ethmoidal air cells bilaterally. The mastoid air cells are unopacified. No evidence of calvarial fracture.  IMPRESSION: 1. No acute intracranial abnormality. 2. Right occipital approach ventricular shunt terminates in the body of the right lateral ventricle. No ventriculomegaly. 3. Intracranial atherosclerosis, diffuse prominent cerebral volume loss, prominent chronic small vessel ischemic white matter change and old lacunar infarcts in the bilateral basal ganglia. 4. Partial opacification of the bilateral paranasal sinuses.   Electronically Signed   By: Ilona Sorrel M.D.   On: 09/13/2015 09:47   Ct Chest Wo Contrast  09/13/2015   CLINICAL DATA:  69 year old male with worsening shortness of breath. Cardiopulmonary arrest, CPR by EMS. Initial encounter. Dialysis, COPD, cardiac diastolic dysfunction.  EXAM: CT CHEST WITHOUT CONTRAST  TECHNIQUE: Multidetector CT imaging of the chest was performed following the standard protocol without IV contrast.  COMPARISON:  Portable chest radiographs 0702 hours today. Chest CT 01/09/2015.  FINDINGS: Intubated. Endotracheal tube tip above the carina. Enteric tube in place and courses to the mid portion of the stomach.  Loculated right pleural effusion is slightly smaller than in January, moderate to large. Component along the right mediastinal contour has increased (series 21, image 13 today versus series 2, image 13 in January). Small layering left pleural effusion. No pericardial effusion.  No mediastinal hematoma. Extensive calcified  atherosclerosis of the aorta. No mediastinal or hilar lymphadenopathy evident in the absence of contrast. No axillary lymphadenopathy.  Air bronchograms in the superior segment right lower lobe and inferior right upper lobe. Compressive right greater than left pulmonary atelectasis elsewhere. Small volume retained secretions in the mainstem bronchi. No pneumothorax.  No sternal fracture. No right rib fracture identified. No left rib fracture identified. Partially visible subcutaneous catheter tubing along the right anterior chest, the more medial tube appears to enter the peritoneal cavity as before. No acute osseous abnormality identified.  Stable visualized noncontrast upper abdominal viscera.  IMPRESSION: 1. Endotracheal tube tip above the carina. Enteric tube course to the mid stomach. 2. Chronic loculated right pleural effusion is mildly regressed since January. New small layering left pleural effusion. No pericardial effusion. 3. Aspiration versus pneumonia in the superior segment right lower lobe. Compressive pulmonary atelectasis elsewhere. 4. No sternal or rib fractures identified.   Electronically Signed   By: Genevie Ann M.D.   On: 09/13/2015 09:40   Dg Chest Port 1 View  09/13/2015  CLINICAL DATA:  Right-sided the central venous line placement.  EXAM: PORTABLE CHEST 1 VIEW  COMPARISON:  Chest x-ray and chest CT from earlier same day.  FINDINGS: Right-sided central line appears adequately positioned with tip in the superior vena cava. An adjacent ventriculoperitoneal shunt also noted, similar in location to previous studies. Endotracheal tube appears well positioned with tip approximately 2.5 cm above the carina. Enteric tube passes below the diaphragm. Cardiac pacer pads remain in place.  An additional catheter overlying the right lung is noted to be within the subcutaneous soft tissues of the right chest/abdominal wall on CT performed earlier today, likely residual from a previous VP shunt.   Cardiomegaly appears stable in the short-term interval. No pneumothorax seen. Opacity at the right lung base is unchanged, compatible with the pleural effusion and atelectasis identified on earlier chest CT. Left lung remains relatively clear.  IMPRESSION: All tubes and lines appear well positioned. This includes the new right-sided internal jugular central line which appears adequately positioned with tip in the superior vena cava. No pneumothorax or other post-procedural complicating feature identified.  Stable opacity at the right lung base compatible with the combination of pleural effusion and atelectasis identified on earlier chest CT.  Cardiomegaly appears stable.   Electronically Signed   By: Franki Cabot M.D.   On: 09/13/2015 11:29     ASSESSMENT / PLAN:  PULMONARY ETT 10/3 >> A: Acute respiratory failure 2nd to HCAP, AECOPD and cardiac arrest. Chronic Rt pleural effusion. Tobacco abuse. P:   Full vent support F/u CXR Solumedrol 40 mg q8h Scheduled duonebs  CARDIOVASCULAR Rt IJ CVL 10/3 >> A:  Respiratory leading to cardiac arrest. Acute on chronic diastolic dysfx. PAF. P:  Normothermia protocol ASA, coreg, cardizem  RENAL A:   ESRD on HD. Hyperkalemia, lactic acidosis >> resolved. P:   HD per nephrology  GASTROINTESTINAL A:   Nutrition. Hx of Upper GI bleed in February 2016. P:   Tube feeds Protonix  HEMATOLOGIC A:   Anemia, thrombocytopenia of critical illness and chronic disease. P:  F/u CBC SQ heparin  INFECTIOUS A:   HCAP. P:   Day 2 zosyn  Sputum >>  ENDOCRINE A:   Hyperglycemia.   P:   SSI  NEUROLOGIC A:   Acute encephalopathy 2nd to cardiac arrest. Hx of CVA. Hx of retinitis pigmentosa s/p VP shunt. P:   RASS goal: -1  CC time 34 minutes.  Chesley Mires, MD [facility] Pulmonary/Critical Care 09/14/2015, 9:31 AM Pager:  [phone] After 3pm call: [phone]

## 2015-09-14 NOTE — Progress Notes (Signed)
  [facility] Progress Note   Subjective: had HD yest with 3kg off, no sig BP drops.  No cxr today. Put on sedation due to some agitation, biting the tube  Filed Vitals:   09/14/15 0811 09/14/15 0900 09/14/15 1000 09/14/15 1100  BP:  147/43 148/53 131/36  Pulse: 99 85 80 72  Temp:   96.3 F (35.7 C)   TempSrc:   Core (Comment)   Resp: 33 13 16 23   Height:      Weight:      SpO2:  100% 100% 100%   Exam: Intubated, unresponsive No rash, cyanosis or gangrene Sclera anicteric, throat clear No jvd Chest coarse BS bilat  RRR slightly tachy no RG Abd slight distended, tympanitic +BS no ascites GU normal male Ext 1+ LLE edema, no RLE edema LUA AVF +bruit Neuro is not responding   MWF [place] 4h 65.5kg 2/2.25 bath Heparin none LUA AVF Hect 7 ug Venofer 50/ wk Mircera 200 every 2 wks  Assessment: 1 Cardiac arrest (asystole) at home 2 ESRD HD mwf 3 HCAP/ RLL PNA - on zosyn per primary 4 Volume is under dry wt , cxr neg for edema 5 HTN on coreg/ dilt 6 COPD 7 Blind d/t RP 8 Hyperkalemia resolved  Plan - HD tomorrow    Kelly Splinter MD  pager [phone]    cell [phone]  09/14/2015, 11:12 AM     Recent Labs Lab 09/13/15 0731 09/13/15 1030 09/13/15 2130 09/14/15 0335  NA  --  139 134* 136  K  --  5.0 3.7 3.9  CL  --  100* 92* 93*  CO2  --  25 25 28   GLUCOSE  --  165* 112* 97  BUN  --  42* 18 24*  CREATININE  --  10.47* 4.37* 6.35*  CALCIUM  --  9.4 9.3 8.7*  PHOS 7.9*  --   --  4.8*    Recent Labs Lab 09/13/15 0657  AST 121*  ALT 111*  ALKPHOS 53  BILITOT 0.8  PROT 6.2*  ALBUMIN 2.8*    Recent Labs Lab 09/13/15 0657 09/13/15 0705 09/14/15 0335  WBC 9.1  --  6.7  NEUTROABS 8.2*  --   --   HGB 8.1* 9.2* 8.7*  HCT 26.3* 27.0* 28.0*  MCV 100.8*  --  97.2  PLT 75*  --  71*   . antiseptic oral rinse  7 mL Mouth Rinse 10 times per day  . aspirin  81 mg Oral Daily  . carvedilol  12.5 mg Per Tube BID WC  . chlorhexidine  gluconate  15 mL Mouth Rinse BID  . diltiazem  60 mg Per Tube Q12H  . feeding supplement (VITAL HIGH PROTEIN)  1,000 mL Per Tube Q24H  . heparin  5,000 Units Subcutaneous 3 times per day  . insulin aspart  0-9 Units Subcutaneous 6 times per day  . ipratropium-albuterol  3 mL Nebulization Q6H  . methylPREDNISolone (SOLU-MEDROL) injection  40 mg Intravenous 3 times per day  . pantoprazole sodium  40 mg Per Tube Q24H  . piperacillin-tazobactam (ZOSYN)  IV  2.25 g Intravenous Q8H   . fentaNYL infusion INTRAVENOUS 50 mcg/hr (09/14/15 1028)   sodium chloride, sodium chloride, acetaminophen (TYLENOL) oral liquid 160 mg/5 mL, albuterol, alteplase, bisacodyl, fentaNYL, heparin, hydrALAZINE, lidocaine (PF), lidocaine-prilocaine, midazolam, pentafluoroprop-tetrafluoroeth, sennosides

## 2015-09-14 NOTE — Progress Notes (Signed)
Hypoglycemic Event  CBG: 11:30-66  Treatment: D50 IV 25 mL  Symptoms: None  Follow-up CBG: Time: 1200 CBG Result: 92  Possible Reasons for Event: Inadequate meal intake; tube feeds to start today  Comments/MD notified: Dr. Halford Chessman notified    [name], Cameron Mendez  Remember to initiate Hypoglycemia Order Set & complete

## 2015-09-15 ENCOUNTER — Inpatient Hospital Stay (HOSPITAL_COMMUNITY): Payer: Medicare Other

## 2015-09-15 DIAGNOSIS — H3552 Pigmentary retinal dystrophy: Secondary | ICD-10-CM

## 2015-09-15 DIAGNOSIS — J948 Other specified pleural conditions: Secondary | ICD-10-CM

## 2015-09-15 LAB — RENAL FUNCTION PANEL
Albumin: 2.7 g/dL — ABNORMAL LOW (ref 3.5–5.0)
Anion gap: 18 — ABNORMAL HIGH (ref 5–15)
BUN: 68 mg/dL — AB (ref 6–20)
CHLORIDE: 91 mmol/L — AB (ref 101–111)
CO2: 26 mmol/L (ref 22–32)
CREATININE: 8.23 mg/dL — AB (ref 0.61–1.24)
Calcium: 8.2 mg/dL — ABNORMAL LOW (ref 8.9–10.3)
GFR calc Af Amer: 7 mL/min — ABNORMAL LOW (ref >60)
GFR, EST NON AFRICAN AMERICAN: 6 mL/min — AB (ref >60)
GLUCOSE: 124 mg/dL — AB (ref 65–99)
Phosphorus: 6 mg/dL — ABNORMAL HIGH (ref 2.5–4.6)
Potassium: 3.8 mmol/L (ref 3.5–5.1)
Sodium: 135 mmol/L (ref 135–145)

## 2015-09-15 LAB — CBC
HCT: 23.5 % — ABNORMAL LOW (ref 39.0–52.0)
Hemoglobin: 7.6 g/dL — ABNORMAL LOW (ref 13.0–17.0)
MCH: 31.3 pg (ref 26.0–34.0)
MCHC: 32.3 g/dL (ref 30.0–36.0)
MCV: 96.7 fL (ref 78.0–100.0)
PLATELETS: 73 10*3/uL — AB (ref 150–400)
RBC: 2.43 MIL/uL — ABNORMAL LOW (ref 4.22–5.81)
RDW: 18.2 % — AB (ref 11.5–15.5)
WBC: 6.1 10*3/uL (ref 4.0–10.5)

## 2015-09-15 LAB — GLUCOSE, CAPILLARY
GLUCOSE-CAPILLARY: 102 mg/dL — AB (ref 65–99)
GLUCOSE-CAPILLARY: 106 mg/dL — AB (ref 65–99)
GLUCOSE-CAPILLARY: 114 mg/dL — AB (ref 65–99)
GLUCOSE-CAPILLARY: 119 mg/dL — AB (ref 65–99)
GLUCOSE-CAPILLARY: 130 mg/dL — AB (ref 65–99)
Glucose-Capillary: 124 mg/dL — ABNORMAL HIGH (ref 65–99)
Glucose-Capillary: 101 mg/dL — ABNORMAL HIGH (ref 65–99)

## 2015-09-15 LAB — PROCALCITONIN: Procalcitonin: 32.63 ng/mL

## 2015-09-15 MED ORDER — ACETAMINOPHEN 160 MG/5ML PO SOLN
650.0000 mg | Freq: Four times a day (QID) | ORAL | Status: DC | PRN
  Filled 2015-09-15: qty 20.3

## 2015-09-15 MED ORDER — VANCOMYCIN HCL IN DEXTROSE 750-5 MG/150ML-% IV SOLN
750.0000 mg | INTRAVENOUS | Status: DC
  Administered 2015-09-15 – 2015-09-17 (×2): 750 mg via INTRAVENOUS
  Filled 2015-09-15 (×2): qty 150

## 2015-09-15 MED ORDER — SODIUM CHLORIDE 0.9 % IV BOLUS (SEPSIS)
500.0000 mL | Freq: Once | INTRAVENOUS | Status: AC
  Administered 2015-09-15: 500 mL via INTRAVENOUS

## 2015-09-15 MED ORDER — LIDOCAINE-PRILOCAINE 2.5-2.5 % EX CREA: 1.0000 "application " | TOPICAL_CREAM | CUTANEOUS | Status: DC | PRN

## 2015-09-15 MED ORDER — SODIUM CHLORIDE 0.9 % IV BOLUS (SEPSIS)
1000.0000 mL | Freq: Once | INTRAVENOUS | Status: AC
  Administered 2015-09-15: 1000 mL via INTRAVENOUS

## 2015-09-15 MED ORDER — PENTAFLUOROPROP-TETRAFLUOROETH EX AERO: 1.0000 "application " | INHALATION_SPRAY | CUTANEOUS | Status: DC | PRN

## 2015-09-15 MED ORDER — SODIUM CHLORIDE 0.9 % IV SOLN: 100.0000 mL | INTRAVENOUS | Status: DC | PRN

## 2015-09-15 MED ORDER — NOREPINEPHRINE BITARTRATE 1 MG/ML IV SOLN
2.0000 ug/min | INTRAVENOUS | Status: DC
  Administered 2015-09-15: 2 ug/min via INTRAVENOUS
  Filled 2015-09-15: qty 16

## 2015-09-15 MED ORDER — ALTEPLASE 2 MG IJ SOLR: 2.0000 mg | Freq: Once | INTRAMUSCULAR | Status: DC | PRN

## 2015-09-15 MED ORDER — LIDOCAINE HCL (PF) 1 % IJ SOLN: 5.0000 mL | INTRAMUSCULAR | Status: DC | PRN

## 2015-09-15 MED ORDER — NOREPINEPHRINE BITARTRATE 1 MG/ML IV SOLN
2.0000 ug/min | INTRAVENOUS | Status: DC
  Filled 2015-09-15: qty 4

## 2015-09-15 MED ORDER — DARBEPOETIN ALFA 100 MCG/0.5ML IJ SOSY
100.0000 ug | PREFILLED_SYRINGE | Freq: Once | INTRAMUSCULAR | Status: AC
  Administered 2015-09-15: 100 ug via INTRAVENOUS
  Filled 2015-09-15: qty 0.5

## 2015-09-15 MED ORDER — SODIUM CHLORIDE 0.9 % IV SOLN: 62.5000 mg | INTRAVENOUS | Status: DC

## 2015-09-15 MED ORDER — SODIUM CHLORIDE 0.9 % IV SOLN
1250.0000 mg | Freq: Once | INTRAVENOUS | Status: AC
  Administered 2015-09-15: 1250 mg via INTRAVENOUS
  Filled 2015-09-15: qty 1250

## 2015-09-15 MED ORDER — SODIUM CHLORIDE 0.9 % IV SOLN
62.5000 mg | INTRAVENOUS | Status: DC
  Administered 2015-09-15: 62.5 mg via INTRAVENOUS
  Filled 2015-09-15: qty 5

## 2015-09-15 MED ORDER — HEPARIN SODIUM (PORCINE) 1000 UNIT/ML DIALYSIS: 1000.0000 [IU] | INTRAMUSCULAR | Status: DC | PRN

## 2015-09-15 NOTE — Progress Notes (Signed)
PULMONARY / CRITICAL CARE MEDICINE   Name: Cameron Mendez MRN: [id number] DOB: 01/10/1946    ADMISSION DATE:  09/13/2015  REFERRING MD :  ER  CHIEF COMPLAINT:  Cardiac arrest  INITIAL PRESENTATION:  69 yo male with dyspnea, apnea, respiratory leading to cardiac arrest.  STUDIES:  10/3 CT head >> Rt VP shunt, chronic small vessel ischemic white matter changes, old b/l basal ganglia lacunar infarcts 10/3 CT chest >> Loculated Rt effusion, Atx 10/3 EEG >> no epileptiform activity 10/3 Echo >> mod LVH, EF 60 to 02%, grade 2 diastolic dysfx, mild MR, PAS 53 mmHg  SIGNIFICANT EVENTS: 10/3 Admit, nephrology consulted, normothermia protocol  SUBJECTIVE:  Okay with PS 10/5 >> tachypnea with lower PS.  VITAL SIGNS: Temp:  [96.3 F (35.7 C)-98.8 F (37.1 C)] 98.4 F (36.9 C) (10/05 0700) Pulse Rate:  [55-85] 70 (10/05 0800) Resp:  [13-23] 16 (10/05 0800) BP: (92-151)/(25-58) 144/38 mmHg (10/05 0800) SpO2:  [97 %-100 %] 97 % (10/05 0843) FiO2 (%):  [40 %] 40 % (10/05 0843) Weight:  [149 lb 4 oz (67.7 kg)-149 lb 14.6 oz (68 kg)] 149 lb 14.6 oz (68 kg) (10/05 0500) HEMODYNAMICS: CVP:  [5 mmHg-9 mmHg] 9 mmHg VENTILATOR SETTINGS: Vent Mode:  [-] CPAP;PSV FiO2 (%):  [40 %] 40 % Set Rate:  [16 bmp] 16 bmp Vt Set:  [500 mL] 500 mL PEEP:  [5 cmH20] 5 cmH20 Pressure Support:  [10 cmH20] 10 cmH20 Plateau Pressure:  [27 cmH20-29 cmH20] 27 cmH20 INTAKE / OUTPUT:  Intake/Output Summary (Last 24 hours) at 09/15/15 0848 Last data filed at 09/15/15 0800  Gross per 24 hour  Intake 1252.09 ml  Output     80 ml  Net 1172.09 ml    PHYSICAL EXAMINATION: General: ill appearing Neuro:  Opens eyes with stimulation HEENT:  Blind, ETT in place Cardiovascular:  regular Lungs:  B/l crackles Abdomen:  Soft, non tender Musculoskeletal:  1+ edema, Lt arm AV fistula Skin:  No rashes  LABS:  CBC  Recent Labs Lab 09/13/15 0657 09/13/15 0705 09/14/15 0335 09/15/15 0513  WBC 9.1  --  6.7  6.1  HGB 8.1* 9.2* 8.7* 7.6*  HCT 26.3* 27.0* 28.0* 23.5*  PLT 75*  --  71* 73*   Coag's  Recent Labs Lab 09/13/15 0731 09/13/15 1030  APTT 49* 40*  INR 1.24 1.19   BMET  Recent Labs Lab 09/13/15 2130 09/14/15 0335 09/15/15 0513  NA 134* 136 135  K 3.7 3.9 3.8  CL 92* 93* 91*  CO2 25 28 26   BUN 18 24* 68*  CREATININE 4.37* 6.35* 8.23*  GLUCOSE 112* 97 124*   Electrolytes  Recent Labs Lab 09/13/15 0731  09/13/15 2130 09/14/15 0335 09/15/15 0513  CALCIUM  --   < > 9.3 8.7* 8.2*  MG 3.1*  --   --  2.5*  --   PHOS 7.9*  --   --  4.8* 6.0*  < > = values in this interval not displayed.   Sepsis Markers  Recent Labs Lab 09/13/15 0706 09/13/15 1039 09/13/15 1208 09/14/15 0335 09/15/15 0513  LATICACIDVEN 6.98* 1.04  --   --   --   PROCALCITON  --   --  7.74 30.19 32.63   ABG  Recent Labs Lab 09/13/15 0824 09/14/15 0420  PHART 7.540* 7.451*  PCO2ART 30.8* 37.6  PO2ART 140.0* 126*   Liver Enzymes  Recent Labs Lab 09/13/15 0657 09/15/15 0513  AST 121*  --   ALT 111*  --  ALKPHOS 53  --   BILITOT 0.8  --   ALBUMIN 2.8* 2.7*   Cardiac Enzymes  Recent Labs Lab 09/13/15 1535 09/13/15 2130 09/14/15 0335  TROPONINI 0.10* 0.13* 0.09*   Glucose  Recent Labs Lab 09/14/15 1158 09/14/15 1602 09/14/15 1952 09/15/15 0015 09/15/15 0455 09/15/15 0744  GLUCAP 92 113* 114* 119* 124* 102*    Imaging Dg Chest Port 1 View  09/15/2015   CLINICAL DATA:  Healthcare associated pneumonia  EXAM: PORTABLE CHEST 1 VIEW  COMPARISON:  09/13/2015  FINDINGS: Endotracheal tube is 2 cm above the carina. Right jugular central venous catheter tip in the SVC unchanged. VP shunt tubing noted on the right. NG tube in place.  Bibasilar airspace disease right greater than left shows mild progression. This may be atelectasis or pneumonia. Right pleural effusion is present. Pulmonary vascular congestion  IMPRESSION: Progression of bibasilar atelectasis/infiltrate. Small  moderate right effusion. Pulmonary vascular congestion.   Electronically Signed   By: Franchot Gallo M.D.   On: 09/15/2015 07:20     ASSESSMENT / PLAN:  PULMONARY ETT 10/3 >> A: Acute respiratory failure 2nd to HCAP, AECOPD and cardiac arrest. Chronic Rt pleural effusion. Tobacco abuse. P:   Full vent support F/u CXR Change solumedrol 40 mg q12h on 10/06 Scheduled duonebs Might need thoracentesis if clinical status fails to improve  CARDIOVASCULAR Rt IJ CVL 10/3 >> A:  Respiratory leading to cardiac arrest. Acute on chronic diastolic dysfx. PAF. P:  Normothermia protocol Continue ASA, coreg, cardizem  RENAL A:   ESRD on HD. Hyperkalemia, lactic acidosis >> resolved. P:   HD per nephrology  GASTROINTESTINAL A:   Nutrition. Hx of Upper GI bleed in February 2016. P:   Tube feeds Protonix  HEMATOLOGIC A:   Anemia, thrombocytopenia of critical illness and chronic disease. P:  F/u CBC SQ heparin  INFECTIOUS A:   HCAP. P:   Day 3 zosyn Add vancomycin 10/5  Sputum 10/3 >> GPC, GNR >> Blood 10/3 >>  ENDOCRINE A:   Hyperglycemia.   P:   SSI  NEUROLOGIC A:   Acute encephalopathy 2nd to cardiac arrest. Hx of CVA. Hx of retinitis pigmentosa s/p VP shunt. Hearing deficit. P:   RASS goal: -1 Will be difficult to assess cognitive function  Updated pt's wife by phone 10/4.  CC time 32 minutes.  Chesley Mires, MD [facility] Pulmonary/Critical Care 09/15/2015, 8:48 AM Pager:  [phone] After 3pm call: [phone]

## 2015-09-15 NOTE — Progress Notes (Signed)
[place] Progress Note Patient Name: Cameron Mendez DOB: 1946-09-01 MRN: [id number]   Date of Service  09/15/2015  HPI/Events of Note  Hypotension - BP = 99/20 MAP = 43. Goal MAP >= 70.  CVP = 2.  eICU Interventions  Will order: 1. Bolus with 0.9 NaCl 1 liter IV over 1 hour now. 2. Norepinephrine IV infusion titrate to MAP >= 70.      Intervention Category Intermediate Interventions: Hypotension - evaluation and management  Alicen Donalson Eugene 09/15/2015, 10:25 PM

## 2015-09-15 NOTE — Clinical Documentation Improvement (Signed)
Critical Care  Can the possible, probable, susupected diagnosis and noted treatment/ monitoring be further specified?   Sepsis - specify causative organism if known  Determine if there is Severe Sepsis (Sepsis with organ dysfunction - specify), Septic Shock if present  Identify etiology of Sepsis - Device, Implant, Graft, Infusion, Abortion}  Other  Clinically Undetermined  Document any associated diagnoses/conditions.  Supporting Information:  09/13/2015 07:06 09/13/2015 10:39 09/13/2015 12:08 09/14/2015 03:35 09/15/2015 05:13  Lactic Acid, Venous 6.98 (HH) 1.04     Procalcitonin   7.74 30.19 32.63  09/15/15 progr note.Marland KitchenMarland KitchenINFECTIOUS  A: HCAP. P: Day 3 zosyn, Add vancomycin 10/5; Sputum 10/3 >> GPC, GNR >>; Blood 10/3 >>"...  Please exercise your independent, professional judgment when responding. A specific answer is not anticipated or expected.  Thank You,  Ermelinda Das, RN, BSN, [place] Certified Clinical Documentation Specialist [place]: Health Information Management [phone]

## 2015-09-15 NOTE — Progress Notes (Signed)
ANTIBIOTIC CONSULT NOTE - FOLLOW UP  Pharmacy Consult for Vancomycin and Zosyn Indication: pneumonia  Allergies  Allergen Reactions  . Ibuprofen Other (See Comments)    Kidney problems-dialysis patient  . Nsaids Other (See Comments)    Kidney problems-dialysis patient    Patient Measurements: Height: 5\' 2"  (157.5 cm) Weight: 149 lb 14.6 oz (68 kg) IBW/kg (Calculated) : 54.6  Vital Signs: Temp: 98.4 F (36.9 C) (10/05 0700) Temp Source: Core (Comment) (10/05 0700) BP: 144/38 mmHg (10/05 0800) Pulse Rate: 70 (10/05 0800) Intake/Output from previous day: 10/04 0701 - 10/05 0700 In: 1242.1 [I.V.:108.8; NG/GT:983.3; IV Piggyback:150] Out: 95 [Urine:45; Emesis/NG output:50]  Labs:  Recent Labs  09/13/15 0657 09/13/15 0705  09/13/15 2130 09/14/15 0335 09/15/15 0513  WBC 9.1  --   --   --  6.7 6.1  HGB 8.1* 9.2*  --   --  8.7* 7.6*  PLT 75*  --   --   --  71* 73*  CREATININE 10.40* 9.90*  < > 4.37* 6.35* 8.23*  < > = values in this interval not displayed.  Microbiology: Recent Results (from the past 720 hour(s))  Culture, blood (routine x 2)     Status: None (Preliminary result)   Collection Time: 09/13/15 11:00 AM  Result Value Ref Range Status   Specimen Description BLOOD CENTRAL LINE  Final   Special Requests BOTTLES DRAWN AEROBIC AND ANAEROBIC 5CC  Final   Culture NO GROWTH 1 DAY  Final   Report Status PENDING  Incomplete  MRSA PCR Screening     Status: None   Collection Time: 09/13/15 11:55 AM  Result Value Ref Range Status   MRSA by PCR NEGATIVE NEGATIVE Final    Comment:        The GeneXpert MRSA Assay (FDA approved for NASAL specimens only), is one component of a comprehensive MRSA colonization surveillance program. It is not intended to diagnose MRSA infection nor to guide or monitor treatment for MRSA infections.   Culture, Urine     Status: None   Collection Time: 09/13/15 11:57 AM  Result Value Ref Range Status   Specimen Description URINE,  CATHETERIZED  Final   Special Requests Normal  Final   Culture NO GROWTH 1 DAY  Final   Report Status 09/14/2015 FINAL  Final  Culture, respiratory (NON-Expectorated)     Status: None (Preliminary result)   Collection Time: 09/13/15 12:03 PM  Result Value Ref Range Status   Specimen Description TRACHEAL ASPIRATE  Final   Special Requests NONE  Final   Gram Stain   Final    RARE WBC PRESENT, PREDOMINANTLY PMN RARE SQUAMOUS EPITHELIAL CELLS PRESENT FEW GRAM POSITIVE COCCI IN PAIRS RARE GRAM NEGATIVE RODS Performed at [facility]    Culture   Final    Non-Pathogenic Oropharyngeal-type Flora Isolated. Performed at [facility]    Report Status PENDING  Incomplete  Culture, blood (routine x 2)     Status: None (Preliminary result)   Collection Time: 09/13/15 12:43 PM  Result Value Ref Range Status   Specimen Description BLOOD RIGHT HAND  Final   Special Requests BOTTLES DRAWN AEROBIC ONLY 5CC  Final   Culture NO GROWTH < 24 HOURS  Final   Report Status PENDING  Incomplete   Assessment:   Day # 3 Zosyn for HCAP coverage.   GPC in pairs and GNR in 10/3 trach aspirate culture, adding Vancomycin today.   ESRD, usual HD MWF. Spoke with dialysis unit, expect  HD later this afternoon.  Goal of Therapy:  pre-dialysis Vancomycin levels 15-25 mcg/ml  Appropriate Zosyn dose for renal function and infection  Plan:   Vancomycin 1250 mg IV x 1 this am.  Will follow up for HD later today and give supplemental dose after HD.  Plan Vancomycin 750 mg IV after each HD session.  Continue Zosyn 2.25 gm IV q8hrs.  Follow culture data, progress.  Arty Baumgartner, [place] Pager: [phone] 09/15/2015,9:09 AM

## 2015-09-15 NOTE — Progress Notes (Signed)
eLink Physician-Brief Progress Note Patient Name: Cameron Mendez DOB: 1946/01/04 MRN: [id number]   Date of Service  09/15/2015  HPI/Events of Note  Hypotension - BP = 78/29 post dialysis today.  Sedation IV infusion now held.   eICU Interventions  Will bolus with 0.9 NaCl 500 mL IV over 30 minutes now.      Intervention Category Intermediate Interventions: Hypotension - evaluation and management  Rachael Ferrie Eugene 09/15/2015, 9:22 PM

## 2015-09-15 NOTE — Progress Notes (Signed)
[facility] Progress Note   Subjective: had HD yest with 3kg off, no sig BP drops.  No cxr today. Put on sedation due to some agitation, biting the tube  Filed Vitals:   09/15/15 0800 09/15/15 0843 09/15/15 0900 09/15/15 1000  BP: 144/38  164/46 144/40  Pulse: 70  80 77  Temp: 98.6 F (37 C)  98.4 F (36.9 C) 96.9 F (36.1 C)  TempSrc: Core (Comment)  Core (Comment) Core (Comment)  Resp: 16  16 17   Height:      Weight:      SpO2: 99% 97% 99% 98%   Exam: Intubated, eyes open , no signs of purposeful response to commands, not tracking No jvd Chest clear bilat RRR slightly tachy no RG Abd slight distended, tympanitic +BS no ascites GU normal male Ext 1+ LLE edema, no RLE edema LUA AVF +bruit Neuro is not responding   MWF [place] 4h 65.5kg 2/2.25 bath Heparin none LUA AVF Hect 7 ug Venofer 50/ wk Mircera 200 every 2 wks  Assessment: 1 Cardiac arrest (asystole) at home 2 ESRD HD mwf 3 HCAP/ RLL PNA - on zosyn per primary 4 Volume up 2-3 kg by wts, no edema last cxr 5 HTN on coreg/ dilt 6 COPD 7 Blind d/t RP  Plan - HD today    Kelly Splinter MD  pager [phone]    cell [phone]  09/15/2015, 11:31 AM     Recent Labs Lab 09/13/15 0731  09/13/15 2130 09/14/15 0335 09/15/15 0513  NA  --   < > 134* 136 135  K  --   < > 3.7 3.9 3.8  CL  --   < > 92* 93* 91*  CO2  --   < > 25 28 26   GLUCOSE  --   < > 112* 97 124*  BUN  --   < > 18 24* 68*  CREATININE  --   < > 4.37* 6.35* 8.23*  CALCIUM  --   < > 9.3 8.7* 8.2*  PHOS 7.9*  --   --  4.8* 6.0*  < > = values in this interval not displayed.  Recent Labs Lab 09/13/15 0657 09/15/15 0513  AST 121*  --   ALT 111*  --   ALKPHOS 53  --   BILITOT 0.8  --   PROT 6.2*  --   ALBUMIN 2.8* 2.7*    Recent Labs Lab 09/13/15 0657 09/13/15 0705 09/14/15 0335 09/15/15 0513  WBC 9.1  --  6.7 6.1  NEUTROABS 8.2*  --   --   --   HGB 8.1* 9.2* 8.7* 7.6*  HCT 26.3* 27.0* 28.0* 23.5*  MCV  100.8*  --  97.2 96.7  PLT 75*  --  71* 73*   . antiseptic oral rinse  7 mL Mouth Rinse 10 times per day  . aspirin  81 mg Oral Daily  . carvedilol  12.5 mg Per Tube BID WC  . chlorhexidine gluconate  15 mL Mouth Rinse BID  . diltiazem  60 mg Per Tube Q12H  . feeding supplement (NEPRO CARB STEADY)  1,000 mL Per Tube Q24H  . feeding supplement (PRO-STAT SUGAR FREE 64)  60 mL Per Tube BID  . heparin  5,000 Units Subcutaneous 3 times per day  . Influenza vac split quadrivalent PF  0.5 mL Intramuscular Tomorrow-1000  . insulin aspart  0-9 Units Subcutaneous 6 times per day  . ipratropium-albuterol  3 mL Nebulization Q6H  . methylPREDNISolone (SOLU-MEDROL)  injection  40 mg Intravenous 3 times per day  . pantoprazole sodium  40 mg Per Tube Q24H  . piperacillin-tazobactam (ZOSYN)  IV  2.25 g Intravenous Q8H   . fentaNYL infusion INTRAVENOUS Stopped (09/15/15 0232)   sodium chloride, sodium chloride, acetaminophen (TYLENOL) oral liquid 160 mg/5 mL, albuterol, alteplase, bisacodyl, fentaNYL, heparin, hydrALAZINE, lidocaine (PF), lidocaine-prilocaine, midazolam, pentafluoroprop-tetrafluoroeth, sennosides

## 2015-09-16 ENCOUNTER — Inpatient Hospital Stay (HOSPITAL_COMMUNITY): Payer: Medicare Other

## 2015-09-16 LAB — RENAL FUNCTION PANEL
Albumin: 3.1 g/dL — ABNORMAL LOW (ref 3.5–5.0)
Anion gap: 15 (ref 5–15)
BUN: 37 mg/dL — ABNORMAL HIGH (ref 6–20)
CO2: 25 mmol/L (ref 22–32)
Calcium: 8 mg/dL — ABNORMAL LOW (ref 8.9–10.3)
Chloride: 96 mmol/L — ABNORMAL LOW (ref 101–111)
Creatinine, Ser: 4.49 mg/dL — ABNORMAL HIGH (ref 0.61–1.24)
GFR calc Af Amer: 14 mL/min — ABNORMAL LOW (ref >60)
GFR calc non Af Amer: 12 mL/min — ABNORMAL LOW (ref >60)
Glucose, Bld: 113 mg/dL — ABNORMAL HIGH (ref 65–99)
Phosphorus: 3.6 mg/dL (ref 2.5–4.6)
Potassium: 3.7 mmol/L (ref 3.5–5.1)
Sodium: 136 mmol/L (ref 135–145)

## 2015-09-16 LAB — GLUCOSE, CAPILLARY
GLUCOSE-CAPILLARY: 109 mg/dL — AB (ref 65–99)
GLUCOSE-CAPILLARY: 107 mg/dL — AB (ref 65–99)
GLUCOSE-CAPILLARY: 115 mg/dL — AB (ref 65–99)
Glucose-Capillary: 123 mg/dL — ABNORMAL HIGH (ref 65–99)
Glucose-Capillary: 115 mg/dL — ABNORMAL HIGH (ref 65–99)
Glucose-Capillary: 98 mg/dL (ref 65–99)

## 2015-09-16 LAB — CBC
HCT: 27.3 % — ABNORMAL LOW (ref 39.0–52.0)
Hemoglobin: 8.6 g/dL — ABNORMAL LOW (ref 13.0–17.0)
MCH: 31.4 pg (ref 26.0–34.0)
MCHC: 31.5 g/dL (ref 30.0–36.0)
MCV: 99.6 fL (ref 78.0–100.0)
Platelets: 78 10*3/uL — ABNORMAL LOW (ref 150–400)
RBC: 2.74 MIL/uL — ABNORMAL LOW (ref 4.22–5.81)
RDW: 18.2 % — ABNORMAL HIGH (ref 11.5–15.5)
WBC: 10.2 10*3/uL (ref 4.0–10.5)

## 2015-09-16 LAB — CULTURE, RESPIRATORY

## 2015-09-16 LAB — CULTURE, RESPIRATORY W GRAM STAIN

## 2015-09-16 MED ORDER — FENTANYL CITRATE (PF) 100 MCG/2ML IJ SOLN
25.0000 ug | INTRAMUSCULAR | Status: DC | PRN
  Administered 2015-09-17: 25 ug via INTRAVENOUS
  Filled 2015-09-16: qty 2

## 2015-09-16 MED ORDER — METHYLPREDNISOLONE SODIUM SUCC 40 MG IJ SOLR
40.0000 mg | Freq: Two times a day (BID) | INTRAMUSCULAR | Status: DC
  Administered 2015-09-16 – 2015-09-17 (×2): 40 mg via INTRAVENOUS
  Filled 2015-09-16 (×2): qty 1

## 2015-09-16 MED ORDER — CETYLPYRIDINIUM CHLORIDE 0.05 % MT LIQD
7.0000 mL | Freq: Two times a day (BID) | OROMUCOSAL | Status: DC
  Administered 2015-09-16 – 2015-09-21 (×8): 7 mL via OROMUCOSAL

## 2015-09-16 NOTE — Procedures (Signed)
Extubation Procedure Note  Patient Details:   Name: Cameron Mendez DOB: 06-May-1946 MRN: [id number]   Airway Documentation:   + cuff leak test prior to extubation.  Evaluation  O2 sats: stable throughout Complications: No apparent complications Patient did tolerate procedure well. Bilateral Breath Sounds: Diminished, Clear, Other (Comment) (coarse) Suctioning: Oral Yes pt able to speak, no stridor noted.  NO distress noted.  Pt placed on 4 lpm Fults, sat 98-100%.   Lenna Sciara 09/16/2015, 11:26 AM

## 2015-09-16 NOTE — Progress Notes (Signed)
Orders for swallow evaluation received. Patient extubated this am 3 day intubation. Will plan to follow up for swallow evaluation in am 10/7 to allow for some spontaneous recovery of swallowing function following prolonged intubation greater than 48 hours.   [place], [place] [phone]

## 2015-09-16 NOTE — Progress Notes (Signed)
  [facility] Progress Note   Subjective: for extubation today. Eyes open but no following commands  Filed Vitals:   09/16/15 0800 09/16/15 0824 09/16/15 0900 09/16/15 1000  BP: 160/37  159/38 168/51  Pulse: 67  67 70  Temp: 98.1 F (36.7 C)  98.2 F (36.8 C) 98.4 F (36.9 C)  TempSrc: Core (Comment)  Core (Comment) Core (Comment)  Resp: 16  10 12   Height:      Weight:      SpO2: 99% 96% 98% 95%   Exam: Intubated, eyes open , not responding to commands No jvd Chest clear bilat RRR slightly tachy no RG Abd slight distended, tympanitic +BS no ascites GU normal male Ext no edema LUA AVF +bruit Neuro as above   MWF [place] 4h 65.5kg 2/2.25 bath Heparin none LUA AVF Hect 7 ug Venofer 50/ wk Mircera 200 every 2 wks  Assessment: 1 Cardiac arrest (asystole) at home 2 VDRF poss extubation today 3 ESRD HD mwf 4 HCAP/ RLL PNA - on vanc/zosyn per primary 5 Volume at dry wt 6 HTN on coreg/ dilt 7 COPD 8 Blind d/t RP  Plan - HD Osborne Oman MD  pager [phone]    cell [phone]  09/16/2015, 10:42 AM     Recent Labs Lab 09/14/15 0335 09/15/15 0513 09/16/15 0430  NA 136 135 136  K 3.9 3.8 3.7  CL 93* 91* 96*  CO2 28 26 25   GLUCOSE 97 124* 113*  BUN 24* 68* 37*  CREATININE 6.35* 8.23* 4.49*  CALCIUM 8.7* 8.2* 8.0*  PHOS 4.8* 6.0* 3.6    Recent Labs Lab 09/13/15 0657 09/15/15 0513 09/16/15 0430  AST 121*  --   --   ALT 111*  --   --   ALKPHOS 53  --   --   BILITOT 0.8  --   --   PROT 6.2*  --   --   ALBUMIN 2.8* 2.7* 3.1*    Recent Labs Lab 09/13/15 0657  09/14/15 0335 09/15/15 0513 09/16/15 0430  WBC 9.1  --  6.7 6.1 10.2  NEUTROABS 8.2*  --   --   --   --   HGB 8.1*  < > 8.7* 7.6* 8.6*  HCT 26.3*  < > 28.0* 23.5* 27.3*  MCV 100.8*  --  97.2 96.7 99.6  PLT 75*  --  71* 73* 78*  < > = values in this interval not displayed. [name] antiseptic oral rinse  7 mL Mouth Rinse 10 times per day  . aspirin  81 mg Oral Daily   . carvedilol  12.5 mg Per Tube BID WC  . chlorhexidine gluconate  15 mL Mouth Rinse BID  . diltiazem  60 mg Per Tube Q12H  . ferric gluconate (FERRLECIT/NULECIT) IV  62.5 mg Intravenous Q Wed-HD  . heparin  5,000 Units Subcutaneous 3 times per day  . Influenza vac split quadrivalent PF  0.5 mL Intramuscular Tomorrow-1000  . insulin aspart  0-9 Units Subcutaneous 6 times per day  . ipratropium-albuterol  3 mL Nebulization Q6H  . methylPREDNISolone (SOLU-MEDROL) injection  40 mg Intravenous Q12H  . pantoprazole sodium  40 mg Per Tube Q24H  . piperacillin-tazobactam (ZOSYN)  IV  2.25 g Intravenous Q8H  . vancomycin  750 mg Intravenous Q M,W,F-HD     sodium chloride, sodium chloride, acetaminophen (TYLENOL) oral liquid 160 mg/5 mL, albuterol, alteplase, fentaNYL (SUBLIMAZE) injection, heparin, hydrALAZINE, lidocaine (PF), lidocaine-prilocaine, pentafluoroprop-tetrafluoroeth

## 2015-09-16 NOTE — Progress Notes (Signed)
PULMONARY / CRITICAL CARE MEDICINE   Name: Cameron Mendez MRN: [id number] DOB: 1946/02/28    ADMISSION DATE:  09/13/2015  REFERRING MD :  ER  CHIEF COMPLAINT:  Cardiac arrest  INITIAL PRESENTATION:  69 yo male with dyspnea, apnea, respiratory leading to cardiac arrest.  STUDIES:  10/3 CT head >> Rt VP shunt, chronic small vessel ischemic white matter changes, old b/l basal ganglia lacunar infarcts 10/3 CT chest >> Loculated Rt effusion, Atx 10/3 EEG >> no epileptiform activity 10/3 Echo >> mod LVH, EF 60 to 87%, grade 2 diastolic dysfx, mild MR, PAS 53 mmHg  SIGNIFICANT EVENTS: 10/3 Admit, nephrology consulted, normothermia protocol 10/5 transient hypotension after HD 10/6 extubated  SUBJECTIVE:  Did well with SBT.  VITAL SIGNS: Temp:  [96.9 F (36.1 C)-99 F (37.2 C)] 98.1 F (36.7 C) (10/06 0800) Pulse Rate:  [62-94] 67 (10/06 0800) Resp:  [9-27] 16 (10/06 0800) BP: (75-192)/(20-79) 160/37 mmHg (10/06 0800) SpO2:  [95 %-100 %] 96 % (10/06 0824) FiO2 (%):  [40 %] 40 % (10/06 0726) Weight:  [143 lb 11.8 oz (65.2 kg)-149 lb 14.6 oz (68 kg)] 143 lb 11.8 oz (65.2 kg) (10/06 0200) HEMODYNAMICS: CVP:  [4 mmHg-16 mmHg] 6 mmHg VENTILATOR SETTINGS: Vent Mode:  [-] PSV;CPAP FiO2 (%):  [40 %] 40 % Set Rate:  [16 bmp] 16 bmp Vt Set:  [500 mL] 500 mL PEEP:  [5 cmH20] 5 cmH20 Pressure Support:  [10 cmH20-18 cmH20] 18 cmH20 Plateau Pressure:  [18 cmH20-23 cmH20] 23 cmH20 INTAKE / OUTPUT:  Intake/Output Summary (Last 24 hours) at 09/16/15 0900 Last data filed at 09/16/15 0800  Gross per 24 hour  Intake 2624.5 ml  Output   1683 ml  Net  941.5 ml    PHYSICAL EXAMINATION: General: no distress Neuro:  Opens eyes with stimulation and localizes HEENT:  Blind, ETT in place Cardiovascular:  regular Lungs: decreased BS Rt base Abdomen:  Soft, non tender Musculoskeletal:  1+ edema, Lt arm AV fistula Skin:  No rashes  LABS:  CBC  Recent Labs Lab 09/14/15 0335  09/15/15 0513 09/16/15 0430  WBC 6.7 6.1 10.2  HGB 8.7* 7.6* 8.6*  HCT 28.0* 23.5* 27.3*  PLT 71* 73* 78*   Coag's  Recent Labs Lab 09/13/15 0731 09/13/15 1030  APTT 49* 40*  INR 1.24 1.19   BMET  Recent Labs Lab 09/14/15 0335 09/15/15 0513 09/16/15 0430  NA 136 135 136  K 3.9 3.8 3.7  CL 93* 91* 96*  CO2 28 26 25   BUN 24* 68* 37*  CREATININE 6.35* 8.23* 4.49*  GLUCOSE 97 124* 113*   Electrolytes  Recent Labs Lab 09/13/15 0731  09/14/15 0335 09/15/15 0513 09/16/15 0430  CALCIUM  --   < > 8.7* 8.2* 8.0*  MG 3.1*  --  2.5*  --   --   PHOS 7.9*  --  4.8* 6.0* 3.6  < > = values in this interval not displayed.   Sepsis Markers  Recent Labs Lab 09/13/15 0706 09/13/15 1039 09/13/15 1208 09/14/15 0335 09/15/15 0513  LATICACIDVEN 6.98* 1.04  --   --   --   PROCALCITON  --   --  7.74 30.19 32.63   ABG  Recent Labs Lab 09/13/15 0824 09/14/15 0420  PHART 7.540* 7.451*  PCO2ART 30.8* 37.6  PO2ART 140.0* 126*   Liver Enzymes  Recent Labs Lab 09/13/15 0657 09/15/15 0513 09/16/15 0430  AST 121*  --   --   ALT 111*  --   --  ALKPHOS 53  --   --   BILITOT 0.8  --   --   ALBUMIN 2.8* 2.7* 3.1*   Cardiac Enzymes  Recent Labs Lab 09/13/15 1535 09/13/15 2130 09/14/15 0335  TROPONINI 0.10* 0.13* 0.09*   Glucose  Recent Labs Lab 09/15/15 1107 09/15/15 1601 09/15/15 2005 09/15/15 2342 09/16/15 0349 09/16/15 0753  GLUCAP 130* 114* 101* 106* 109* 123*    Imaging Dg Chest Port 1 View  09/16/2015   CLINICAL DATA:  Healthcare associated pneumonia, respiratory failure, cardiac arrest, dialysis dependent renal failure.  EXAM: PORTABLE CHEST 1 VIEW  COMPARISON:  Portable chest x-ray of September 15, 2015  FINDINGS: The lung volumes remain low. Confluent alveolar opacity in the lower 1/2 of the right lung persists. The cardiac silhouette remains enlarged. The pulmonary vascularity is engorged and indistinct.  The endotracheal tube tip lies 3 cm  above the carina. The esophagogastric tube tip projects below the inferior margin of the image.  IMPRESSION: Persistent pneumonia in the mid and lower right lung at and in the left lower lung. Moderate-sized right pleural effusion. Persistent pulmonary vascular congestion. The support tubes are in reasonable position.   Electronically Signed   By: David  Martinique M.D.   On: 09/16/2015 07:19     ASSESSMENT / PLAN:  PULMONARY ETT 10/3 >> 10/6 A: Acute respiratory failure 2nd to HCAP, AECOPD and cardiac arrest. Chronic Rt pleural effusion. Tobacco abuse. P:   Proceed with extubation 10/6 F/u CXR Change solumedrol 40 mg q12h on 10/06 Scheduled duonebs Might need thoracentesis   CARDIOVASCULAR Rt IJ CVL 10/3 >> A:  Respiratory leading to cardiac arrest. Acute on chronic diastolic dysfx. PAF. P:  Normothermia protocol Continue ASA, coreg, cardizem  RENAL A:   ESRD on HD. Hyperkalemia, lactic acidosis >> resolved. P:   HD per nephrology  GASTROINTESTINAL A:   Nutrition. Hx of Upper GI bleed in February 2016. P:   Swallow eval after extubation Protonix  HEMATOLOGIC A:   Anemia, thrombocytopenia of critical illness and chronic disease. P:  F/u CBC SQ heparin  INFECTIOUS A:   HCAP. P:   Day 4 Abx, currently on zosyn and vancomycin F/u procalcitonin  Sputum 10/3 >> GPC, GNR >> Oral flora Blood 10/3 >>  ENDOCRINE A:   Hyperglycemia.   P:   SSI  NEUROLOGIC A:   Acute encephalopathy 2nd to cardiac arrest. Hx of CVA. Hx of retinitis pigmentosa s/p VP shunt. Hearing deficit. P:   Monitor mental status after extubation  Updated pt's wife by phone 10/4.  CC time 34 minutes.  Chesley Mires, MD [facility] Pulmonary/Critical Care 09/16/2015, 9:00 AM Pager:  [phone] After 3pm call: [phone]

## 2015-09-16 NOTE — Care Management Important Message (Signed)
Important Message  Patient Details  Name: Cameron Mendez MRN: [id number] Date of Birth: 1946-05-11   Medicare Important Message Given:  Yes-second notification given    Delorse Lek 09/16/2015, 11:50 AM

## 2015-09-16 NOTE — Progress Notes (Addendum)
240 mL of fentanyl wasted in the sink with 2 RNs witnessing.   Rosebud Poles RN  [facility] RN

## 2015-09-17 DIAGNOSIS — J9600 Acute respiratory failure, unspecified whether with hypoxia or hypercapnia: Secondary | ICD-10-CM

## 2015-09-17 LAB — GLUCOSE, CAPILLARY
GLUCOSE-CAPILLARY: 93 mg/dL (ref 65–99)
GLUCOSE-CAPILLARY: 106 mg/dL — AB (ref 65–99)
Glucose-Capillary: 115 mg/dL — ABNORMAL HIGH (ref 65–99)
Glucose-Capillary: 59 mg/dL — ABNORMAL LOW (ref 65–99)
Glucose-Capillary: 116 mg/dL — ABNORMAL HIGH (ref 65–99)

## 2015-09-17 LAB — PROCALCITONIN: PROCALCITONIN: 19.28 ng/mL

## 2015-09-17 MED ORDER — SODIUM CHLORIDE 0.9 % IV SOLN: 100.0000 mL | INTRAVENOUS | Status: DC | PRN

## 2015-09-17 MED ORDER — HEPARIN SODIUM (PORCINE) 1000 UNIT/ML DIALYSIS: 1000.0000 [IU] | INTRAMUSCULAR | Status: DC | PRN

## 2015-09-17 MED ORDER — ALTEPLASE 2 MG IJ SOLR
2.0000 mg | Freq: Once | INTRAMUSCULAR | Status: DC | PRN
  Filled 2015-09-17: qty 2

## 2015-09-17 MED ORDER — PENTAFLUOROPROP-TETRAFLUOROETH EX AERO: 1.0000 "application " | INHALATION_SPRAY | CUTANEOUS | Status: DC | PRN

## 2015-09-17 MED ORDER — METHYLPREDNISOLONE SODIUM SUCC 40 MG IJ SOLR
40.0000 mg | INTRAMUSCULAR | Status: DC
  Administered 2015-09-18 – 2015-09-19 (×2): 40 mg via INTRAVENOUS
  Filled 2015-09-17 (×2): qty 1

## 2015-09-17 MED ORDER — LIDOCAINE HCL (PF) 1 % IJ SOLN: 5.0000 mL | INTRAMUSCULAR | Status: DC | PRN

## 2015-09-17 MED ORDER — LIDOCAINE-PRILOCAINE 2.5-2.5 % EX CREA
1.0000 "application " | TOPICAL_CREAM | CUTANEOUS | Status: DC | PRN
  Filled 2015-09-17: qty 5

## 2015-09-17 NOTE — Progress Notes (Signed)
Patient off unit to dialysis via bed, transported with RN and tech.  elink notified.  Held morning medications, will resume care

## 2015-09-17 NOTE — Evaluation (Signed)
Clinical/Bedside Swallow Evaluation Patient Details  Name: Cameron Mendez MRN: [id number] Date of Birth: [birth year]  Today's Date: 09/17/2015 Time: SLP Start Time (ACUTE ONLY): 18 SLP Stop Time (ACUTE ONLY): 1517 SLP Time Calculation (min) (ACUTE ONLY): 15 min  Past Medical History:  Past Medical History  Diagnosis Date  . Anemia   . Diastolic dysfunction   . Retinitis pigmentosa     Blindness--has shunt --placed yrs ago in [place]..  . Arthritis     Gout  . Hypertension     dx--"long time"  . CKD (chronic kidney disease), stage IV ([place])     a. L upper extremity AV fistula created 07/2012.  [name] BPH (benign prostatic hyperplasia)   . Blind   . Gangrene of finger ([place])     a. 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  . Colon polyps   . Pericardial effusion     a. Noted on echo 10/2009. b. Again seen on echo 09/2013.  [name] CHF (congestive heart failure) ([place])   . COPD (chronic obstructive pulmonary disease) ([place])   . GI (gastrointestinal bleed) feb 2016  . Irregular heart rate 03/03/2015   Past Surgical History:  Past Surgical History  Procedure Laterality Date  . Cataract extraction w/ intraocular lens  implant, bilateral Bilateral   . Knee ligament reconstruction Left   . Av fistula placement  07/15/2012    Procedure: ARTERIOVENOUS (AV) FISTULA CREATION;  Surgeon: Rosetta Posner, MD;  Location: [place];  Service: Vascular;  Laterality: Left;  . Amputation  12/30/2012    Procedure: AMPUTATION DIGIT;  Surgeon: Tennis Must, MD;  Location: [place];  Service: Orthopedics;  Laterality: Left;  LEFT SMALL FINGER AMPUTATION  . Pericardiocentesis  09/2013  . Pericardial tap N/A 09/19/2013    Procedure: PERICARDIAL TAP;  Surgeon: Blane Ohara, MD;  Location: [facility] C[facility] LAB;  Service: Cardiovascular;  Laterality: N/A;   HPI:  69 y/o with a PMH of smoking, remote bilateral basal ganglia infarcts, retinitis pigmentosa with resultant blindness, VP shunt, arthritis, HTN,  CKD IV, BPH, pericardial effusion, colon polyps, prior GIB in 01/2015 ), PAF, and COPD admitted after suffering an in home cardiac arrest.Intubated 10/3-10/6. CXR persistent pneumonia in the mid and lower right lung at and in the left lower lung. Moderate-sized right pleural effusion. Persistent pulmonary vascular congestion. BSE 09/22/13 recommended regular diet (ground meats), thin.    Assessment / Plan / Recommendation Clinical Impression  Pt awake with low vocal intensity and delayed processing. Continuous coughing/snorting sound prior to po's attempting to expel mucous, mildly successful. Delayed oral and pharyngeal swallow initiation and continued to cough. RN reports pt appears to consume pills crushed in applesauce without apparent difficulty. Recommend NPO except meds, MBS next date, continue oral care.     Aspiration Risk   (mod-severe)    Diet Recommendation NPO except meds        Other  Recommendations Oral Care Recommendations: Oral care QID   Follow Up Recommendations       Frequency and Duration        Pertinent Vitals/Pain none    SLP Swallow Goals     Swallow Study Prior Functional Status       General Other Pertinent Information: 69 y/o with a PMH of smoking, remote bilateral basal ganglia infarcts, retinitis pigmentosa with resultant blindness, VP shunt, arthritis, HTN, CKD IV, BPH, pericardial effusion, colon polyps, prior GIB in 01/2015 ), PAF, and COPD admitted after suffering an in home  cardiac arrest.Intubated 10/3-10/6. CXR persistent pneumonia in the mid and lower right lung at and in the left lower lung. Moderate-sized right pleural effusion. Persistent pulmonary vascular congestion. BSE 09/22/13 recommended regular diet (ground meats), thin.  Type of Study: Bedside swallow evaluation Previous Swallow Assessment:  (see HPI) Diet Prior to this Study: NPO Temperature Spikes Noted: No Respiratory Status: Room air History of Recent Intubation: Yes Length  of Intubations (days):  (3-4 days) Date extubated: 09/16/15 Behavior/Cognition: Alert;Cooperative;Pleasant mood;Requires cueing (slow processing) Oral Cavity - Dentition: Edentulous Self-Feeding Abilities: Needs assist;Needs set up Patient Positioning: Upright in bed Baseline Vocal Quality: Low vocal intensity Volitional Cough: Strong Volitional Swallow: Able to elicit    Oral/Motor/Sensory Function Overall Oral Motor/Sensory Function: Appears within functional limits for tasks assessed (slow movements)   Ice Chips Ice chips: Impaired Presentation: Spoon Oral Phase Impairments: Reduced labial seal;Impaired anterior to posterior transit;Reduced lingual movement/coordination Oral Phase Functional Implications: Prolonged oral transit Pharyngeal Phase Impairments: Cough - Delayed   Thin Liquid Thin Liquid: Not tested    Nectar Thick Nectar Thick Liquid: Not tested   Honey Thick Honey Thick Liquid: Not tested   Puree Puree: Not tested   Solid   GO    Solid: Not tested       Cameron Mendez 09/17/2015,3:48 PM  Orbie Pyo Colvin Caroli.Ed [employer] [phone]

## 2015-09-17 NOTE — Progress Notes (Signed)
Pt to floor at after 1600 via bed.   Oriented to room.  Call bell at bedside.  Instructed to call as needed.   Verbalized understanding.  Karie Kirks, Therapist, sports.

## 2015-09-17 NOTE — Progress Notes (Signed)
PULMONARY / CRITICAL CARE MEDICINE   Name: Cameron Mendez MRN: [id number] DOB: 08/25/1946    ADMISSION DATE:  09/13/2015  REFERRING MD :  ER  CHIEF COMPLAINT:  Cardiac arrest  INITIAL PRESENTATION:  69 yo male with dyspnea, apnea, respiratory leading to cardiac arrest.  STUDIES:  10/3 CT head >> Rt VP shunt, chronic small vessel ischemic white matter changes, old b/l basal ganglia lacunar infarcts 10/3 CT chest >> Loculated Rt effusion, Atx 10/3 EEG >> no epileptiform activity 10/3 Echo >> mod LVH, EF 60 to 35%, grade 2 diastolic dysfx, mild MR, PAS 53 mmHg  SIGNIFICANT EVENTS: 10/3 Admit, nephrology consulted, normothermia protocol 10/5 transient hypotension after HD 10/6 extubated  SUBJECTIVE:  Tolerated HD well  Denies pain, dyspnea  VITAL SIGNS: Temp:  [97 F (36.1 C)-98.6 F (37 C)] 97.1 F (36.2 C) (10/07 1145) Pulse Rate:  [60-77] 69 (10/07 1224) Resp:  [12-26] 21 (10/07 1224) BP: (96-183)/(31-80) 138/80 mmHg (10/07 1224) SpO2:  [96 %-100 %] 96 % (10/07 1224) FiO2 (%):  [40 %] 40 % (10/06 1949) Weight:  [61.6 kg (135 lb 12.9 oz)-65 kg (143 lb 4.8 oz)] 61.6 kg (135 lb 12.9 oz) (10/07 1145) HEMODYNAMICS: CVP:  [10 mmHg] 10 mmHg VENTILATOR SETTINGS: Vent Mode:  [-]  FiO2 (%):  [40 %] 40 % INTAKE / OUTPUT:  Intake/Output Summary (Last 24 hours) at 09/17/15 1254 Last data filed at 09/17/15 1145  Gross per 24 hour  Intake    150 ml  Output   2652 ml  Net  -2502 ml    PHYSICAL EXAMINATION: General: no distress Neuro:  Opens eyes with stimulation and localizes HEENT:  Blind, no jvd Cardiovascular:  regular Lungs: decreased BS Rt base Abdomen:  Soft, non tender Musculoskeletal:  1+ edema, Lt arm AV fistula Skin:  No rashes  LABS:  CBC  Recent Labs Lab 09/14/15 0335 09/15/15 0513 09/16/15 0430  WBC 6.7 6.1 10.2  HGB 8.7* 7.6* 8.6*  HCT 28.0* 23.5* 27.3*  PLT 71* 73* 78*   Coag's  Recent Labs Lab 09/13/15 0731 09/13/15 1030  APTT 49* 40*   INR 1.24 1.19   BMET  Recent Labs Lab 09/14/15 0335 09/15/15 0513 09/16/15 0430  NA 136 135 136  K 3.9 3.8 3.7  CL 93* 91* 96*  CO2 28 26 25   BUN 24* 68* 37*  CREATININE 6.35* 8.23* 4.49*  GLUCOSE 97 124* 113*   Electrolytes  Recent Labs Lab 09/13/15 0731  09/14/15 0335 09/15/15 0513 09/16/15 0430  CALCIUM  --   < > 8.7* 8.2* 8.0*  MG 3.1*  --  2.5*  --   --   PHOS 7.9*  --  4.8* 6.0* 3.6  < > = values in this interval not displayed.   Sepsis Markers  Recent Labs Lab 09/13/15 0706 09/13/15 1039  09/14/15 0335 09/15/15 0513 09/17/15 0403  LATICACIDVEN 6.98* 1.04  --   --   --   --   PROCALCITON  --   --   < > 30.19 32.63 19.28  < > = values in this interval not displayed. ABG  Recent Labs Lab 09/13/15 0824 09/14/15 0420  PHART 7.540* 7.451*  PCO2ART 30.8* 37.6  PO2ART 140.0* 126*   Liver Enzymes  Recent Labs Lab 09/13/15 0657 09/15/15 0513 09/16/15 0430  AST 121*  --   --   ALT 111*  --   --   ALKPHOS 53  --   --   BILITOT 0.8  --   --  ALBUMIN 2.8* 2.7* 3.1*   Cardiac Enzymes  Recent Labs Lab 09/13/15 1535 09/13/15 2130 09/14/15 0335  TROPONINI 0.10* 0.13* 0.09*   Glucose  Recent Labs Lab 09/16/15 1211 09/16/15 1648 09/16/15 2028 09/16/15 2325 09/17/15 0458 09/17/15 1224  GLUCAP 115* 107* 98 115* 115* 59*    Imaging No results found.   ASSESSMENT / PLAN:  PULMONARY ETT 10/3 >> 10/6 A: Acute respiratory failure 2nd to HCAP, AECOPD and cardiac arrest. Chronic Rt pleural effusion. Tobacco abuse. P:   Change solumedrol 40 mg q24 - Po once clears swallow Scheduled duonebs Might need thoracentesis   CARDIOVASCULAR Rt IJ CVL 10/3 >> A:  Respiratory leading to cardiac arrest -Completed Normothermia protocol Acute on chronic diastolic dysfx. PAF. P:  Continue ASA, coreg, cardizem  RENAL A:   ESRD on HD. Hyperkalemia, lactic acidosis >> resolved. P:   HD per nephrology  GASTROINTESTINAL A:    Nutrition. Hx of Upper GI bleed in February 2016. P:   Swallow eval pending Protonix  HEMATOLOGIC A:   Anemia, thrombocytopenia of critical illness and chronic disease. P:  F/u CBC SQ heparin  INFECTIOUS A:   HCAP. P:   Day 4 Abx, currently on zosyn and vancomycin F/u procalcitonin  Sputum 10/3 >> GPC, GNR >> Oral flora Blood 10/3 >>  ENDOCRINE A:   Hyperglycemia.   P:   SSI  NEUROLOGIC A:   Acute encephalopathy 2nd to cardiac arrest -improving Hx of CVA. Hx of retinitis pigmentosa s/p VP shunt. Hearing deficit. P:   Monitor mental status  PT   Summary - Improving mental status post cardiac arrest, Rt effusion is chronic - ct antibiotics for pneumonia, no evidence of sepsis Can transfer to tele & triad  Rigoberto Noel. MD  [phone]  09/17/2015, 12:54 PM

## 2015-09-17 NOTE — Progress Notes (Signed)
  [facility] Progress Note   Subjective: extubated yesterday.  Lying in bed, not following commands, groans or moans intermittently  Filed Vitals:   09/17/15 1135 09/17/15 1145 09/17/15 1150 09/17/15 1224  BP: 112/44 116/55 147/57 138/80  Pulse: 68 64 64 69  Temp:  97.1 F (36.2 C)    TempSrc:  Oral    Resp:  15  21  Height:      Weight:  61.6 kg (135 lb 12.9 oz)    SpO2:  98%  96%   Exam: Awake, not following commands No jvd Chest clear bilat RRR slightly tachy no RG Abd slight distended, tympanitic +BS no ascites GU normal male Ext no edema LUA AVF +bruit Neuro not following commands, moaning, not verbalizing, eyes open   MWF [place] 4h 65.5kg 2/2.25 bath Heparin none LUA AVF Hect 7 ug Venofer 50/ wk Mircera 200 every 2 wks  Assessment: 1 Cardiac arrest (asystole) at home - prolonged arrest, possible anoxic brain injury, follow.  2 AMS is blind but usually responsive. Not following commands here 3 ESRD HD mwf 4 HCAP/ RLL PNA - on vanc/zosyn per primary 5 Volume below dry wt 6 HTN on coreg/ dilt 7 COPD 8 Blind d/t RP  Plan - HD today    Kelly Splinter MD  pager [phone]    cell [phone]  09/17/2015, 12:49 PM     Recent Labs Lab 09/14/15 0335 09/15/15 0513 09/16/15 0430  NA 136 135 136  K 3.9 3.8 3.7  CL 93* 91* 96*  CO2 28 26 25   GLUCOSE 97 124* 113*  BUN 24* 68* 37*  CREATININE 6.35* 8.23* 4.49*  CALCIUM 8.7* 8.2* 8.0*  PHOS 4.8* 6.0* 3.6    Recent Labs Lab 09/13/15 0657 09/15/15 0513 09/16/15 0430  AST 121*  --   --   ALT 111*  --   --   ALKPHOS 53  --   --   BILITOT 0.8  --   --   PROT 6.2*  --   --   ALBUMIN 2.8* 2.7* 3.1*    Recent Labs Lab 09/13/15 0657  09/14/15 0335 09/15/15 0513 09/16/15 0430  WBC 9.1  --  6.7 6.1 10.2  NEUTROABS 8.2*  --   --   --   --   HGB 8.1*  < > 8.7* 7.6* 8.6*  HCT 26.3*  < > 28.0* 23.5* 27.3*  MCV 100.8*  --  97.2 96.7 99.6  PLT 75*  --  71* 73* 78*  < > = values in  this interval not displayed. [name] antiseptic oral rinse  7 mL Mouth Rinse BID  . aspirin  81 mg Oral Daily  . carvedilol  12.5 mg Per Tube BID WC  . diltiazem  60 mg Per Tube Q12H  . ferric gluconate (FERRLECIT/NULECIT) IV  62.5 mg Intravenous Q Wed-HD  . heparin  5,000 Units Subcutaneous 3 times per day  . Influenza vac split quadrivalent PF  0.5 mL Intramuscular Tomorrow-1000  . insulin aspart  0-9 Units Subcutaneous 6 times per day  . ipratropium-albuterol  3 mL Nebulization Q6H  . methylPREDNISolone (SOLU-MEDROL) injection  40 mg Intravenous Q12H  . pantoprazole sodium  40 mg Per Tube Q24H  . piperacillin-tazobactam (ZOSYN)  IV  2.25 g Intravenous Q8H  . vancomycin  750 mg Intravenous Q M,W,F-HD     acetaminophen (TYLENOL) oral liquid 160 mg/5 mL, albuterol, fentaNYL (SUBLIMAZE) injection, hydrALAZINE

## 2015-09-18 ENCOUNTER — Inpatient Hospital Stay (HOSPITAL_COMMUNITY): Payer: Medicare Other

## 2015-09-18 DIAGNOSIS — R1310 Dysphagia, unspecified: Secondary | ICD-10-CM

## 2015-09-18 DIAGNOSIS — D638 Anemia in other chronic diseases classified elsewhere: Secondary | ICD-10-CM

## 2015-09-18 DIAGNOSIS — J189 Pneumonia, unspecified organism: Secondary | ICD-10-CM | POA: Diagnosis not present

## 2015-09-18 LAB — CULTURE, BLOOD (ROUTINE X 2)
CULTURE: NO GROWTH
Culture: NO GROWTH

## 2015-09-18 LAB — GLUCOSE, CAPILLARY
Glucose-Capillary: 123 mg/dL — ABNORMAL HIGH (ref 65–99)
Glucose-Capillary: 126 mg/dL — ABNORMAL HIGH (ref 65–99)
Glucose-Capillary: 82 mg/dL (ref 65–99)
Glucose-Capillary: 85 mg/dL (ref 65–99)
Glucose-Capillary: 86 mg/dL (ref 65–99)
Glucose-Capillary: 90 mg/dL (ref 65–99)
Glucose-Capillary: 94 mg/dL (ref 65–99)

## 2015-09-18 LAB — PROCALCITONIN: PROCALCITONIN: 12.47 ng/mL

## 2015-09-18 MED ORDER — SODIUM CHLORIDE 0.9 % IJ SOLN
10.0000 mL | INTRAMUSCULAR | Status: DC | PRN
  Administered 2015-09-18 (×2): 20 mL
  Administered 2015-09-19: 30 mL
  Administered 2015-09-19: 20 mL
  Administered 2015-09-20 (×2): 30 mL
  Filled 2015-09-18 (×5): qty 40

## 2015-09-18 MED ORDER — STARCH (THICKENING) PO POWD: ORAL | Status: DC | PRN

## 2015-09-18 MED ORDER — DOXERCALCIFEROL 4 MCG/2ML IV SOLN
7.0000 ug | INTRAVENOUS | Status: DC
  Administered 2015-09-20: 7 ug via INTRAVENOUS
  Filled 2015-09-18: qty 4

## 2015-09-18 MED ORDER — RESOURCE THICKENUP CLEAR PO POWD
ORAL | Status: DC | PRN
  Filled 2015-09-18: qty 125

## 2015-09-18 NOTE — Progress Notes (Signed)
[facility] PROGRESS NOTE  Cameron Mendez IRS:[id number] DOB: Dec 31, 1945 DOA: 09/13/2015  PCP: [name], MD  Brief HPI: 69 year old African-American male with a past medical history of retinitis pigmentosa with resultant blindness, VP shunt, arthritis, end-stage renal disease on hemodialysis, paroxysmal atrial fibrillation not on anticoagulation, history of COPD presented after hours sustaining a cardiac arrest at home. Apparently patient had been getting short of breath about 3 days prior to admission. He had missed dialysis. Cardiac arrest was thought to be secondary to respiratory distress. Patient was successfully resuscitated. He was intubated and on mechanical ventilation. He was in the intensive care unit. Subsequently extubated and then transferred to the floor.  Past medical history:  Past Medical History  Diagnosis Date  . Anemia   . Diastolic dysfunction   . Retinitis pigmentosa     Blindness--has shunt --placed yrs ago in [place]..  . Arthritis     Gout  . Hypertension     dx--"long time"  . CKD (chronic kidney disease), stage IV ([place])     a. L upper extremity AV fistula created 07/2012.  [name] BPH (benign prostatic hyperplasia)   . Blind   . Gangrene of finger ([place])     a. 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  . Colon polyps   . Pericardial effusion     a. Noted on echo 10/2009. b. Again seen on echo 09/2013.  [name] CHF (congestive heart failure) ([place])   . COPD (chronic obstructive pulmonary disease) ([place])   . GI (gastrointestinal bleed) feb 2016  . Irregular heart rate 03/03/2015    Consultants: Nephrology  Procedures: Hemodialysis  Antibiotics: On vancomycin and Zosyn  Subjective: Patient states that he feels well. Denies any chest pain. Denies any difficulty breathing. No cough. No nausea, vomiting. Asking about going home.  Objective: Vital Signs  Filed Vitals:   09/17/15 1736 09/17/15 2030 09/18/15 0044 09/18/15 0456  BP: 157/48 142/46  143/46 149/47  Pulse: 61 59 107 53  Temp: 97.4 F (36.3 C) 98 F (36.7 C) 98 F (36.7 C) 98.6 F (37 C)  TempSrc: Oral Oral Oral Oral  Resp: 16  18 18   Height:      Weight:    61.644 kg (135 lb 14.4 oz)  SpO2: 95% 95% 93% 94%    Intake/Output Summary (Last 24 hours) at 09/18/15 0093 Last data filed at 09/18/15 0600  Gross per 24 hour  Intake    300 ml  Output   2652 ml  Net  -2352 ml   Filed Weights   09/17/15 0805 09/17/15 1145 09/18/15 0456  Weight: 65 kg (143 lb 4.8 oz) 61.6 kg (135 lb 12.9 oz) 61.644 kg (135 lb 14.4 oz)    General appearance: alert, cooperative, appears stated age and no distress Head: Normocephalic, without obvious abnormality, atraumatic Resp: Rhonchorous breath sounds left base with a few end expiratory wheezes. Diminished air entry on the right. Cardio: regular rate and rhythm, S1, S2 normal, no murmur, click, rub or gallop GI: soft, non-tender; bowel sounds normal; no masses,  no organomegaly Extremities: extremities normal, atraumatic, no cyanosis or edema Neurologic: Alert. Oriented to place, year, month person. No obvious focal neurological deficits appreciated on examination. He is legally blind.  Lab Results:  Basic Metabolic Panel:  Recent Labs Lab 09/13/15 0731 09/13/15 1030 09/13/15 2130 09/14/15 0335 09/15/15 0513 09/16/15 0430  NA  --  139 134* 136 135 136  K  --  5.0 3.7 3.9 3.8 3.7  CL  --  100* 92* 93* 91* 96*  CO2  --  25 25 28 26 25   GLUCOSE  --  165* 112* 97 124* 113*  BUN  --  42* 18 24* 68* 37*  CREATININE  --  10.47* 4.37* 6.35* 8.23* 4.49*  CALCIUM  --  9.4 9.3 8.7* 8.2* 8.0*  MG 3.1*  --   --  2.5*  --   --   PHOS 7.9*  --   --  4.8* 6.0* 3.6   Liver Function Tests:  Recent Labs Lab 09/13/15 0657 09/15/15 0513 09/16/15 0430  AST 121*  --   --   ALT 111*  --   --   ALKPHOS 53  --   --   BILITOT 0.8  --   --   PROT 6.2*  --   --   ALBUMIN 2.8* 2.7* 3.1*   CBC:  Recent Labs Lab 09/13/15 0657  09/13/15 0705 09/14/15 0335 09/15/15 0513 09/16/15 0430  WBC 9.1  --  6.7 6.1 10.2  NEUTROABS 8.2*  --   --   --   --   HGB 8.1* 9.2* 8.7* 7.6* 8.6*  HCT 26.3* 27.0* 28.0* 23.5* 27.3*  MCV 100.8*  --  97.2 96.7 99.6  PLT 75*  --  71* 73* 78*   Cardiac Enzymes:  Recent Labs Lab 09/13/15 1030 09/13/15 1535 09/13/15 2130 09/14/15 0335  TROPONINI 0.06* 0.10* 0.13* 0.09*   BNP (last 3 results)  Recent Labs  09/13/15 0657  BNP 1766.2*    CBG:  Recent Labs Lab 09/17/15 1739 09/17/15 2028 09/18/15 0011 09/18/15 0447 09/18/15 0746  GLUCAP 93 106* 94 86 85    Recent Results (from the past 240 hour(s))  Culture, blood (routine x 2)     Status: None   Collection Time: 09/13/15 11:00 AM  Result Value Ref Range Status   Specimen Description BLOOD CENTRAL LINE  Final   Special Requests BOTTLES DRAWN AEROBIC AND ANAEROBIC 5CC  Final   Culture NO GROWTH 5 DAYS  Final   Report Status 09/18/2015 FINAL  Final  MRSA PCR Screening     Status: None   Collection Time: 09/13/15 11:55 AM  Result Value Ref Range Status   MRSA by PCR NEGATIVE NEGATIVE Final    Comment:        The GeneXpert MRSA Assay (FDA approved for NASAL specimens only), is one component of a comprehensive MRSA colonization surveillance program. It is not intended to diagnose MRSA infection nor to guide or monitor treatment for MRSA infections.   Culture, Urine     Status: None   Collection Time: 09/13/15 11:57 AM  Result Value Ref Range Status   Specimen Description URINE, CATHETERIZED  Final   Special Requests Normal  Final   Culture NO GROWTH 1 DAY  Final   Report Status 09/14/2015 FINAL  Final  Culture, respiratory (NON-Expectorated)     Status: None   Collection Time: 09/13/15 12:03 PM  Result Value Ref Range Status   Specimen Description TRACHEAL ASPIRATE  Final   Special Requests NONE  Final   Gram Stain   Final    RARE WBC PRESENT, PREDOMINANTLY PMN RARE SQUAMOUS EPITHELIAL CELLS  PRESENT FEW GRAM POSITIVE COCCI IN PAIRS RARE GRAM NEGATIVE RODS Performed at [facility]    Culture   Final    Non-Pathogenic Oropharyngeal-type Flora Isolated. Performed at [facility]    Report Status 09/16/2015 FINAL  Final  Culture, blood (  routine x 2)     Status: None   Collection Time: 09/13/15 12:43 PM  Result Value Ref Range Status   Specimen Description BLOOD RIGHT HAND  Final   Special Requests BOTTLES DRAWN AEROBIC ONLY 5CC  Final   Culture NO GROWTH 5 DAYS  Final   Report Status 09/18/2015 FINAL  Final      Studies/Results: No results found.  Medications:  Scheduled: . antiseptic oral rinse  7 mL Mouth Rinse BID  . aspirin  81 mg Oral Daily  . carvedilol  12.5 mg Per Tube BID WC  . diltiazem  60 mg Per Tube Q12H  . [START ON 09/20/2015] doxercalciferol  7 mcg Intravenous Q M,W,F-HD  . ferric gluconate (FERRLECIT/NULECIT) IV  62.5 mg Intravenous Q Wed-HD  . heparin  5,000 Units Subcutaneous 3 times per day  . Influenza vac split quadrivalent PF  0.5 mL Intramuscular Tomorrow-1000  . insulin aspart  0-9 Units Subcutaneous 6 times per day  . methylPREDNISolone (SOLU-MEDROL) injection  40 mg Intravenous Q24H  . pantoprazole sodium  40 mg Per Tube Q24H  . piperacillin-tazobactam (ZOSYN)  IV  2.25 g Intravenous Q8H  . vancomycin  750 mg Intravenous Q M,W,F-HD   Continuous:  IZT:IWPYKD chloride, sodium chloride, acetaminophen (TYLENOL) oral liquid 160 mg/5 mL, albuterol, alteplase, heparin, hydrALAZINE, lidocaine (PF), lidocaine-prilocaine, pentafluoroprop-tetrafluoroeth, sodium chloride  Assessment/Plan:  Active Problems:   Cardiac arrest (HCC)   Acute respiratory failure (HCC)   End-stage renal disease on hemodialysis ([place])    Acute respiratory failure with hypoxia Secondary to healthcare associated pneumonia. Patient was extubated on October 6. Seems to be stable. Oxygen as needed.  Healthcare associated pneumonia Today is day 5 of  antibiotics. Currently on vancomycin and Zosyn. Pro-calcitonin noted to be elevated but is improving. Sputum cultures grew oral flora. Blood cultures negative as well. Once patient is able to take orally, will change to appropriate oral medications.  Status post cardiac arrest Secondary to respiratory failure. Seems to be stable. Echocardiogram showed normal systolic function without any wall motion abnormalities. Continue to watch.  Right pleural effusion Chronic per pulmonology.  Chronic diastolic dysfunction Stable. Volume management with dialysis.  History of paroxysmal atrial fibrillation Rate is reasonably well controlled. Continue beta blocker and calcium channel blocker. Patient not noted on anticoagulation. Patient followed by Dr. Doylene Canard.  End-stage renal disease on hemodialysis Patient is dialyzed Monday, Wednesdays and Fridays. Nephrology is following.  Dysphagia Speech pathology is following. Patient is currently nothing by mouth. Await modified barium swallow.  Anemia of chronic disease Hemoglobin is stable. Continue to monitor.  Acute encephalopathy Secondary to acute illness. Was able to answer my questions today. Mental status appears to be improving.  History of retinitis pigmentosa, status post VP shunt Patient is blind as a result. Stable.  DVT Prophylaxis: Subcutaneous heparin    Code Status: Full code  Family Communication: No family at bedside  Disposition Plan: Await PT and OT evaluation. Continue current treatment. Await swallow evaluation as well.     LOS: 5 days   [facility] Pager [phone] 09/18/2015, 9:03 AM  If 7PM-7AM, please contact night-coverage at [email or web address], password [facility]

## 2015-09-18 NOTE — Evaluation (Signed)
Physical Therapy Evaluation Patient Details Name: Cameron Mendez MRN: [id number] DOB: [birth year] Today's Date: 09/18/2015   History of Present Illness  Pt fell while walking at home secondary to cardiac arrest.  He was intubated 10/3, extubated 10/6.  Clinical Impression  Patient's mobility limited by fatigue and generalized weakness.  Will continue to follow patient while on this venue of care to progress mobility. Therapy will continue to follow to assist with discharge planning and follow up recommendations. Unsure at this time how much assist he will have at home upon discharge.       Follow Up Recommendations Home health PT;SNF;Supervision/Assistance - 24 hour    Equipment Recommendations  Rolling walker with 5" wheels    Recommendations for Other Services       Precautions / Restrictions Precautions Precautions: Fall;Other (comment) (blind) Precaution Comments: currently NPO Restrictions Weight Bearing Restrictions: No      Mobility  Bed Mobility Overal bed mobility: Needs Assistance Bed Mobility: Supine to Sit;Sit to Supine     Supine to sit: Min assist Sit to supine: Mod assist (for leg management)   General bed mobility comments: mod cues for orientation to bed and room  Transfers Overall transfer level: Needs assistance Equipment used: Rolling walker (2 wheeled) Transfers: Sit to/from Stand Sit to Stand: Min assist (min cues for hand placement)            Ambulation/Gait Ambulation/Gait assistance: Mod assist Ambulation Distance (Feet): 35 Feet Assistive device: Rolling walker (2 wheeled) Gait Pattern/deviations: Shuffle     General Gait Details: min assist to guide RW secondary to vision difficulties  Stairs            Wheelchair Mobility    Modified Rankin (Stroke Patients Only)       Balance Overall balance assessment: Needs assistance Sitting-balance support: No upper extremity supported;Feet supported Sitting balance-Leahy  Scale: Good     Standing balance support: Bilateral upper extremity supported Standing balance-Leahy Scale: Poor Standing balance comment: assist to use urinal in standing at RW                             Pertinent Vitals/Pain Pain Assessment: No/denies pain    Home Living Family/patient expects to be discharged to:: Private residence Living Arrangements: Spouse/significant other Available Help at Discharge: Family;Available 24 hours/day Type of Home: House Home Access: Stairs to enter Entrance Stairs-Rails: None Entrance Stairs-Number of Steps: 2 (front door) Home Layout: One level Home Equipment: None Additional Comments: HD M, W, F.  Able to shower self in standing, occasional assist from wife    Prior Function Level of Independence: Independent               Hand Dominance   Dominant Hand: Right    Extremity/Trunk Assessment   Upper Extremity Assessment: Overall WFL for tasks assessed (4+/5 bil shoulder flexion, abduct, elbow flexion/ext)           Lower Extremity Assessment: Overall WFL for tasks assessed      Cervical / Trunk Assessment: Normal  Communication   Communication: No difficulties (slowed speech)  Cognition Arousal/Alertness: Awake/alert Behavior During Therapy: WFL for tasks assessed/performed Overall Cognitive Status: Within Functional Limits for tasks assessed (oriented to person, place, situation)                      General Comments General comments (skin integrity, edema, etc.): wound on sacrum    Exercises  Assessment/Plan    PT Assessment Patient needs continued PT services  PT Diagnosis Difficulty walking;Abnormality of gait;Generalized weakness   PT Problem List Decreased strength;Decreased activity tolerance;Decreased balance;Decreased mobility;Decreased knowledge of use of DME;Cardiopulmonary status limiting activity  PT Treatment Interventions DME instruction;Gait training;Stair  training;Functional mobility training;Therapeutic activities;Therapeutic exercise;Balance training;Neuromuscular re-education;Patient/family education   PT Goals (Current goals can be found in the Care Plan section) Acute Rehab PT Goals Patient Stated Goal: get stronger PT Goal Formulation: With patient Time For Goal Achievement: 10/02/15 Potential to Achieve Goals: Good    Frequency Min 3X/week   Barriers to discharge   unsure how much assist his wife can provide upon discharge    Co-evaluation               End of Session Equipment Utilized During Treatment: Gait belt Activity Tolerance: Patient tolerated treatment well Patient left: in bed;with call bell/phone within reach;with bed alarm set Nurse Communication: Mobility status;Other (comment) (applied tactile assist for call bell)         Time: 1020-1053 PT Time Calculation (min) (ACUTE ONLY): 33 min   Charges:   PT Evaluation $Initial PT Evaluation Tier I: 1 Procedure PT Treatments $Gait Training: 8-22 mins   PT G CodesMalka Mendez, PT [phone]  Cameron Mendez 09/18/2015, 1:46 PM

## 2015-09-18 NOTE — Progress Notes (Signed)
Subjective:   Alert, says feeling well but is hungry.   Objective Filed Vitals:   09/17/15 2030 09/18/15 0044 09/18/15 0456 09/18/15 1013  BP: 142/46 143/46 149/47 163/50  Pulse: 59 107 53 59  Temp: 98 F (36.7 C) 98 F (36.7 C) 98.6 F (37 C) 98.2 F (36.8 C)  TempSrc: Oral Oral Oral Oral  Resp:  18 18 18   Height:      Weight:   61.644 kg (135 lb 14.4 oz)   SpO2: 95% 93% 94% 94%   Physical Exam General:  Alert and oriented, no acute distress Heart: RRR  Lungs: CTA, unlabored  Abdomen: soft, nontender +BS  Extremities: no edema  Dialysis Access:  L AVf +b/t   [facility] 4h 65.5kg 2/2.25 bath Heparin none LUA AVF Hect 7 ug Venofer 50/ wk Mircera 200 every 2 wks  Assessment/Plan: 1. Cardiac arrest (asystole) 2. HCAP- on vanc and zosyn/ afebrile 2. ESRD - MWF [place]- next HD Monday.  3. Anemia - hgb 8.6- cont Fe- micera last given 9.30 4. Secondary hyperparathyroidism - phos 3.6. Cont hectorol. No binders 5. HTN/volume - coreg, cardizem and prn hydralazine 6. Nutrition -  NPO per swallow eval- alb 3.1 7. DM 8. AMS - better today, conversant  Shelle Iron, NP [place] [phone] 09/18/2015,11:20 AM  LOS: 5 days   Pt seen, examined and agree w A/P as above.  Kelly Splinter MD pager [phone]    cell [phone] 09/18/2015, 12:50 PM    Additional Objective Labs: Basic Metabolic Panel:  Recent Labs Lab 09/14/15 0335 09/15/15 0513 09/16/15 0430  NA 136 135 136  K 3.9 3.8 3.7  CL 93* 91* 96*  CO2 28 26 25   GLUCOSE 97 124* 113*  BUN 24* 68* 37*  CREATININE 6.35* 8.23* 4.49*  CALCIUM 8.7* 8.2* 8.0*  PHOS 4.8* 6.0* 3.6   Liver Function Tests:  Recent Labs Lab 09/13/15 0657 09/15/15 0513 09/16/15 0430  AST 121*  --   --   ALT 111*  --   --   ALKPHOS 53  --   --   BILITOT 0.8  --   --   PROT 6.2*  --   --   ALBUMIN 2.8* 2.7* 3.1*   No results for input(s): LIPASE, AMYLASE in the last 168 hours. CBC:  Recent Labs Lab  09/13/15 0657  09/14/15 0335 09/15/15 0513 09/16/15 0430  WBC 9.1  --  6.7 6.1 10.2  NEUTROABS 8.2*  --   --   --   --   HGB 8.1*  < > 8.7* 7.6* 8.6*  HCT 26.3*  < > 28.0* 23.5* 27.3*  MCV 100.8*  --  97.2 96.7 99.6  PLT 75*  --  71* 73* 78*  < > = values in this interval not displayed. Blood Culture    Component Value Date/Time   SDES BLOOD RIGHT HAND 09/13/2015 1243   SPECREQUEST BOTTLES DRAWN AEROBIC ONLY 5CC 09/13/2015 1243   CULT NO GROWTH 5 DAYS 09/13/2015 1243   REPTSTATUS 09/18/2015 FINAL 09/13/2015 1243    Cardiac Enzymes:  Recent Labs Lab 09/13/15 1030 09/13/15 1535 09/13/15 2130 09/14/15 0335  TROPONINI 0.06* 0.10* 0.13* 0.09*   CBG:  Recent Labs Lab 09/17/15 1739 09/17/15 2028 09/18/15 0011 09/18/15 0447 09/18/15 0746  GLUCAP 93 106* 94 86 85   Iron Studies: No results for input(s): IRON, TIBC, TRANSFERRIN, FERRITIN in the last 72 hours. @lablastinr3 @ Studies/Results: No results found. Medications:   . antiseptic oral  rinse  7 mL Mouth Rinse BID  . aspirin  81 mg Oral Daily  . carvedilol  12.5 mg Per Tube BID WC  . diltiazem  60 mg Per Tube Q12H  . ferric gluconate (FERRLECIT/NULECIT) IV  62.5 mg Intravenous Q Wed-HD  . heparin  5,000 Units Subcutaneous 3 times per day  . Influenza vac split quadrivalent PF  0.5 mL Intramuscular Tomorrow-1000  . insulin aspart  0-9 Units Subcutaneous 6 times per day  . ipratropium-albuterol  3 mL Nebulization Q6H  . methylPREDNISolone (SOLU-MEDROL) injection  40 mg Intravenous Q24H  . pantoprazole sodium  40 mg Per Tube Q24H  . piperacillin-tazobactam (ZOSYN)  IV  2.25 g Intravenous Q8H  . vancomycin  750 mg Intravenous Q M,W,F-HD

## 2015-09-18 NOTE — Consult Note (Signed)
MBS completed this date; recommend Dysphagia 2/honey-thickened liquids with small bites/sips; no straws and subsequent swallow after intake with each bite/sip; please see imaging section for full MBS report  Jerlyn Ly, M.S., CCC-SLP Acute Rehab

## 2015-09-18 NOTE — Progress Notes (Signed)
ANTIBIOTIC CONSULT NOTE - FOLLOW UP  Pharmacy Consult for Vancomycin and Zosyn Indication: HCAP  Allergies  Allergen Reactions  . Ibuprofen Other (See Comments)    Kidney problems-dialysis patient  . Nsaids Other (See Comments)    Kidney problems-dialysis patient    Patient Measurements: Height: 5\' 2"  (157.5 cm) Weight: 135 lb 14.4 oz (61.644 kg) IBW/kg (Calculated) : 54.6  Vital Signs: Temp: 97.7 F (36.5 C) (10/08 1240) Temp Source: Oral (10/08 1240) BP: 133/40 mmHg (10/08 1240) Pulse Rate: 59 (10/08 1240) Intake/Output from previous day: 10/07 0701 - 10/08 0700 In: 300 [IV Piggyback:300] Out: 2652   Labs:  Recent Labs  09/16/15 0430  WBC 10.2  HGB 8.6*  PLT 78*  CREATININE 4.49*    Microbiology: Recent Results (from the past 720 hour(s))  Culture, blood (routine x 2)     Status: None   Collection Time: 09/13/15 11:00 AM  Result Value Ref Range Status   Specimen Description BLOOD CENTRAL LINE  Final   Special Requests BOTTLES DRAWN AEROBIC AND ANAEROBIC 5CC  Final   Culture NO GROWTH 5 DAYS  Final   Report Status 09/18/2015 FINAL  Final  MRSA PCR Screening     Status: None   Collection Time: 09/13/15 11:55 AM  Result Value Ref Range Status   MRSA by PCR NEGATIVE NEGATIVE Final    Comment:        The GeneXpert MRSA Assay (FDA approved for NASAL specimens only), is one component of a comprehensive MRSA colonization surveillance program. It is not intended to diagnose MRSA infection nor to guide or monitor treatment for MRSA infections.   Culture, Urine     Status: None   Collection Time: 09/13/15 11:57 AM  Result Value Ref Range Status   Specimen Description URINE, CATHETERIZED  Final   Special Requests Normal  Final   Culture NO GROWTH 1 DAY  Final   Report Status 09/14/2015 FINAL  Final  Culture, respiratory (NON-Expectorated)     Status: None   Collection Time: 09/13/15 12:03 PM  Result Value Ref Range Status   Specimen Description TRACHEAL  ASPIRATE  Final   Special Requests NONE  Final   Gram Stain   Final    RARE WBC PRESENT, PREDOMINANTLY PMN RARE SQUAMOUS EPITHELIAL CELLS PRESENT FEW GRAM POSITIVE COCCI IN PAIRS RARE GRAM NEGATIVE RODS Performed at [facility]    Culture   Final    Non-Pathogenic Oropharyngeal-type Flora Isolated. Performed at [facility]    Report Status 09/16/2015 FINAL  Final  Culture, blood (routine x 2)     Status: None   Collection Time: 09/13/15 12:43 PM  Result Value Ref Range Status   Specimen Description BLOOD RIGHT HAND  Final   Special Requests BOTTLES DRAWN AEROBIC ONLY 5CC  Final   Culture NO GROWTH 5 DAYS  Final   Report Status 09/18/2015 FINAL  Final   Assessment: 69 y/o male with ESRD on day 4 vancomycin and day 6 Zosyn for HCAP. He is afebrile, WBC are normal, and PCT is trending down. Cultures are ngtd. MRSA PCR neg.  Goal of Therapy:  pre-dialysis Vancomycin levels 15-25 mcg/ml  Appropriate Zosyn dose for renal function and infection  Plan:  - Vancomycin 750 mg IV after each HD session - consider d/c, will maintain current level until next HD on Mon - Continue Zosyn 2.25 gm IV q8h - Follow culture data and progress  [facility], Pharm.D., BCPS Clinical Pharmacist Pager: [phone] 09/18/2015 3:34  PM

## 2015-09-19 DIAGNOSIS — J449 Chronic obstructive pulmonary disease, unspecified: Secondary | ICD-10-CM

## 2015-09-19 LAB — BASIC METABOLIC PANEL
ANION GAP: 18 — AB (ref 5–15)
BUN: 79 mg/dL — ABNORMAL HIGH (ref 6–20)
CALCIUM: 8.2 mg/dL — AB (ref 8.9–10.3)
CO2: 23 mmol/L (ref 22–32)
CREATININE: 8.13 mg/dL — AB (ref 0.61–1.24)
Chloride: 96 mmol/L — ABNORMAL LOW (ref 101–111)
GFR, EST AFRICAN AMERICAN: 7 mL/min — AB (ref >60)
GFR, EST NON AFRICAN AMERICAN: 6 mL/min — AB (ref >60)
GLUCOSE: 118 mg/dL — AB (ref 65–99)
Potassium: 3.8 mmol/L (ref 3.5–5.1)
Sodium: 137 mmol/L (ref 135–145)

## 2015-09-19 LAB — PROCALCITONIN: PROCALCITONIN: 6.5 ng/mL

## 2015-09-19 LAB — CBC
HCT: 27.4 % — ABNORMAL LOW (ref 39.0–52.0)
Hemoglobin: 9 g/dL — ABNORMAL LOW (ref 13.0–17.0)
MCH: 32.4 pg (ref 26.0–34.0)
MCHC: 32.8 g/dL (ref 30.0–36.0)
MCV: 98.6 fL (ref 78.0–100.0)
PLATELETS: 66 10*3/uL — AB (ref 150–400)
RBC: 2.78 MIL/uL — ABNORMAL LOW (ref 4.22–5.81)
RDW: 18.4 % — AB (ref 11.5–15.5)
WBC: 6.7 10*3/uL (ref 4.0–10.5)

## 2015-09-19 LAB — C DIFFICILE QUICK SCREEN W PCR REFLEX
C Diff antigen: POSITIVE — AB
C Diff toxin: NEGATIVE

## 2015-09-19 LAB — GLUCOSE, CAPILLARY
Glucose-Capillary: 111 mg/dL — ABNORMAL HIGH (ref 65–99)
Glucose-Capillary: 105 mg/dL — ABNORMAL HIGH (ref 65–99)
Glucose-Capillary: 97 mg/dL (ref 65–99)

## 2015-09-19 MED ORDER — SACCHAROMYCES BOULARDII 250 MG PO CAPS
250.0000 mg | ORAL_CAPSULE | Freq: Two times a day (BID) | ORAL | Status: DC
  Administered 2015-09-19 – 2015-09-21 (×5): 250 mg via ORAL
  Filled 2015-09-19 (×5): qty 1

## 2015-09-19 MED ORDER — AMOXICILLIN-POT CLAVULANATE 500-125 MG PO TABS
1.0000 | ORAL_TABLET | ORAL | Status: DC
  Administered 2015-09-19 – 2015-09-20 (×2): 500 mg via ORAL
  Filled 2015-09-19 (×3): qty 1

## 2015-09-19 MED ORDER — CARVEDILOL 12.5 MG PO TABS
12.5000 mg | ORAL_TABLET | Freq: Two times a day (BID) | ORAL | Status: DC
  Administered 2015-09-19 – 2015-09-21 (×5): 12.5 mg via ORAL
  Filled 2015-09-19 (×5): qty 1

## 2015-09-19 MED ORDER — DILTIAZEM 12 MG/ML ORAL SUSPENSION
60.0000 mg | Freq: Two times a day (BID) | ORAL | Status: DC
  Administered 2015-09-19 – 2015-09-21 (×5): 60 mg via ORAL
  Filled 2015-09-19 (×7): qty 6

## 2015-09-19 MED ORDER — LEVOFLOXACIN 750 MG PO TABS
750.0000 mg | ORAL_TABLET | Freq: Once | ORAL | Status: AC
  Administered 2015-09-19: 750 mg via ORAL
  Filled 2015-09-19: qty 1

## 2015-09-19 MED ORDER — METRONIDAZOLE 500 MG PO TABS
500.0000 mg | ORAL_TABLET | Freq: Three times a day (TID) | ORAL | Status: DC
  Administered 2015-09-19 – 2015-09-21 (×8): 500 mg via ORAL
  Filled 2015-09-19 (×8): qty 1

## 2015-09-19 MED ORDER — LEVOFLOXACIN 500 MG PO TABS: 500.0000 mg | ORAL_TABLET | ORAL | Status: DC

## 2015-09-19 NOTE — Progress Notes (Signed)
ANTIBIOTIC CONSULT NOTE  Patient transition this morning to Levaquin for PNA. C diff antigen positive but toxin negative however patient is having diarrhea. Concern for C diff with Levaquin use. Dr. [place] Pink ok to change to Augmentin.  D/c Levaquin Augmentin 500 mg PO qpm Pharmacy signing off  [facility], Pharm.D., BCPS Clinical Pharmacist Pager: [phone] 09/19/2015 3:05 PM

## 2015-09-19 NOTE — Progress Notes (Signed)
[facility] PROGRESS NOTE  Cameron Mendez ZGY:[id number] DOB: 06-21-1946 DOA: 09/13/2015  PCP: [name], MD  Brief HPI: 69 year old African-American male with a past medical history of retinitis pigmentosa with resultant blindness, VP shunt, arthritis, end-stage renal disease on hemodialysis, paroxysmal atrial fibrillation not on anticoagulation, history of COPD presented after hours sustaining a cardiac arrest at home. Apparently patient had been getting short of breath about 3 days prior to admission. He had missed dialysis. Cardiac arrest was thought to be secondary to respiratory distress. Patient was successfully resuscitated. He was intubated and on mechanical ventilation. He was in the intensive care unit. Subsequently extubated and then transferred to the floor.  Past medical history:  Past Medical History  Diagnosis Date  . Anemia   . Diastolic dysfunction   . Retinitis pigmentosa     Blindness--has shunt --placed yrs ago in [place]..  . Arthritis     Gout  . Hypertension     dx--"long time"  . CKD (chronic kidney disease), stage IV ([place])     a. L upper extremity AV fistula created 07/2012.  [name] BPH (benign prostatic hyperplasia)   . Blind   . Gangrene of finger ([place])     a. 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  . Colon polyps   . Pericardial effusion     a. Noted on echo 10/2009. b. Again seen on echo 09/2013.  [name] CHF (congestive heart failure) ([place])   . COPD (chronic obstructive pulmonary disease) ([place])   . GI (gastrointestinal bleed) feb 2016  . Irregular heart rate 03/03/2015    Consultants: Nephrology  Procedures: Hemodialysis MWF  Antibiotics: On vancomycin and Zosyn till 10/9 Levaquin 10/9  Subjective: Patient continues to feel better. Denies any shortness of breath. No cough. No nausea or vomiting. Denies chest pain. Overall feels better. Nurse mentioned that patient has had multiple bowel movements overnight.  Objective: Vital  Signs  Filed Vitals:   09/18/15 1240 09/18/15 2009 09/19/15 0004 09/19/15 0357  BP: 133/40 145/42 148/45 142/50  Pulse: 59 64 60 62  Temp: 97.7 F (36.5 C) 98 F (36.7 C) 97.8 F (36.6 C) 98 F (36.7 C)  TempSrc: Oral Oral Oral Oral  Resp: 20 19 18 18   Height:      Weight:    62.3 kg (137 lb 5.6 oz)  SpO2: 96% 97% 99% 99%    Intake/Output Summary (Last 24 hours) at 09/19/15 0812 Last data filed at 09/19/15 0600  Gross per 24 hour  Intake    785 ml  Output      2 ml  Net    783 ml   Filed Weights   09/17/15 1145 09/18/15 0456 09/19/15 0357  Weight: 61.6 kg (135 lb 12.9 oz) 61.644 kg (135 lb 14.4 oz) 62.3 kg (137 lb 5.6 oz)    General appearance: alert, cooperative, appears stated age and no distress Resp: Improved air entry bilaterally. Rhonchi still appreciated bilateral bases, left more than right. Diminished air entry on the right. Cardio: regular rate and rhythm, S1, S2 normal, no murmur, click, rub or gallop GI: soft, non-tender; bowel sounds normal; no masses,  no organomegaly Extremities: extremities normal, atraumatic, no cyanosis or edema Neurologic: Alert. Oriented to place, year, month person. No obvious focal neurological deficits appreciated on examination. He is legally blind.  Lab Results:  Basic Metabolic Panel:  Recent Labs Lab 09/13/15 0731  09/13/15 2130 09/14/15 0335 09/15/15 0513 09/16/15 0430 09/19/15 0458  NA  --   < >  134* 136 135 136 137  K  --   < > 3.7 3.9 3.8 3.7 3.8  CL  --   < > 92* 93* 91* 96* 96*  CO2  --   < > 25 28 26 25 23   GLUCOSE  --   < > 112* 97 124* 113* 118*  BUN  --   < > 18 24* 68* 37* 79*  CREATININE  --   < > 4.37* 6.35* 8.23* 4.49* 8.13*  CALCIUM  --   < > 9.3 8.7* 8.2* 8.0* 8.2*  MG 3.1*  --   --  2.5*  --   --   --   PHOS 7.9*  --   --  4.8* 6.0* 3.6  --   < > = values in this interval not displayed. Liver Function Tests:  Recent Labs Lab 09/13/15 0657 09/15/15 0513 09/16/15 0430  AST 121*  --   --    ALT 111*  --   --   ALKPHOS 53  --   --   BILITOT 0.8  --   --   PROT 6.2*  --   --   ALBUMIN 2.8* 2.7* 3.1*   CBC:  Recent Labs Lab 09/13/15 0657 09/13/15 0705 09/14/15 0335 09/15/15 0513 09/16/15 0430 09/19/15 0458  WBC 9.1  --  6.7 6.1 10.2 6.7  NEUTROABS 8.2*  --   --   --   --   --   HGB 8.1* 9.2* 8.7* 7.6* 8.6* 9.0*  HCT 26.3* 27.0* 28.0* 23.5* 27.3* 27.4*  MCV 100.8*  --  97.2 96.7 99.6 98.6  PLT 75*  --  71* 73* 78* 66*   Cardiac Enzymes:  Recent Labs Lab 09/13/15 1030 09/13/15 1535 09/13/15 2130 09/14/15 0335  TROPONINI 0.06* 0.10* 0.13* 0.09*   BNP (last 3 results)  Recent Labs  09/13/15 0657  BNP 1766.2*    CBG:  Recent Labs Lab 09/18/15 1323 09/18/15 1618 09/18/15 2154 09/18/15 2356 09/19/15 0406  GLUCAP 90 82 123* 126* 111*    Recent Results (from the past 240 hour(s))  Culture, blood (routine x 2)     Status: None   Collection Time: 09/13/15 11:00 AM  Result Value Ref Range Status   Specimen Description BLOOD CENTRAL LINE  Final   Special Requests BOTTLES DRAWN AEROBIC AND ANAEROBIC 5CC  Final   Culture NO GROWTH 5 DAYS  Final   Report Status 09/18/2015 FINAL  Final  MRSA PCR Screening     Status: None   Collection Time: 09/13/15 11:55 AM  Result Value Ref Range Status   MRSA by PCR NEGATIVE NEGATIVE Final    Comment:        The GeneXpert MRSA Assay (FDA approved for NASAL specimens only), is one component of a comprehensive MRSA colonization surveillance program. It is not intended to diagnose MRSA infection nor to guide or monitor treatment for MRSA infections.   Culture, Urine     Status: None   Collection Time: 09/13/15 11:57 AM  Result Value Ref Range Status   Specimen Description URINE, CATHETERIZED  Final   Special Requests Normal  Final   Culture NO GROWTH 1 DAY  Final   Report Status 09/14/2015 FINAL  Final  Culture, respiratory (NON-Expectorated)     Status: None   Collection Time: 09/13/15 12:03 PM  Result  Value Ref Range Status   Specimen Description TRACHEAL ASPIRATE  Final   Special Requests NONE  Final   Gram Stain  Final    RARE WBC PRESENT, PREDOMINANTLY PMN RARE SQUAMOUS EPITHELIAL CELLS PRESENT FEW GRAM POSITIVE COCCI IN PAIRS RARE GRAM NEGATIVE RODS Performed at [facility]    Culture   Final    Non-Pathogenic Oropharyngeal-type Flora Isolated. Performed at [facility]    Report Status 09/16/2015 FINAL  Final  Culture, blood (routine x 2)     Status: None   Collection Time: 09/13/15 12:43 PM  Result Value Ref Range Status   Specimen Description BLOOD RIGHT HAND  Final   Special Requests BOTTLES DRAWN AEROBIC ONLY 5CC  Final   Culture NO GROWTH 5 DAYS  Final   Report Status 09/18/2015 FINAL  Final  C difficile quick scan w PCR reflex     Status: Abnormal   Collection Time: 09/19/15  6:49 AM  Result Value Ref Range Status   C Diff antigen POSITIVE (A) NEGATIVE Final   C Diff toxin NEGATIVE NEGATIVE Final   C Diff interpretation   Final    C. difficile present, but toxin not detected. This indicates colonization. In most cases, this does not require treatment. If patient has signs and symptoms consistent with colitis, consider treatment. Requires ENTERIC precautions.      Studies/Results: Dg Swallowing Func-speech Pathology  09/18/2015    Objective Swallowing Evaluation:    Patient Details  Name: Cameron Mendez MRN: [id number] Date of Birth: 03-06-1946  Today's Date: 09/18/2015 Time: SLP Start Time (ACUTE ONLY): 1145-SLP Stop Time (ACUTE ONLY): 1200 SLP Time Calculation (min) (ACUTE ONLY): 15 min  Past Medical History:  Past Medical History  Diagnosis Date  . Anemia   . Diastolic dysfunction   . Retinitis pigmentosa     Blindness--has shunt --placed yrs ago in [place]..  . Arthritis     Gout  . Hypertension     dx--"long time"  . CKD (chronic kidney disease), stage IV ([place])     a. L upper extremity AV fistula created 07/2012.  [name] BPH (benign prostatic hyperplasia)    . Blind   . Gangrene of finger ([place])     a. 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  . Colon polyps   . Pericardial effusion     a. Noted on echo 10/2009. b. Again seen on echo 09/2013.  [name] CHF (congestive heart failure) ([place])   . COPD (chronic obstructive pulmonary disease) ([place])   . GI (gastrointestinal bleed) feb 2016  . Irregular heart rate 03/03/2015   Past Surgical History:  Past Surgical History  Procedure Laterality Date  . Cataract extraction w/ intraocular lens  implant, bilateral Bilateral   . Knee ligament reconstruction Left   . Av fistula placement  07/15/2012    Procedure: ARTERIOVENOUS (AV) FISTULA CREATION;  Surgeon: Rosetta Posner,  MD;  Location: [place];  Service: Vascular;  Laterality: Left;  . Amputation  12/30/2012    Procedure: AMPUTATION DIGIT;  Surgeon: Tennis Must, MD;  Location:  [place];  Service: Orthopedics;  Laterality: Left;  LEFT  SMALL FINGER AMPUTATION  . Pericardiocentesis  09/2013  . Pericardial tap N/A 09/19/2013    Procedure: PERICARDIAL TAP;  Surgeon: Blane Ohara, MD;  Location:  [facility] CATH LAB;  Service: Cardiovascular;  Laterality: N/A;   HPI:  Other Pertinent Information: 69 y/o with a PMH of smoking, remote  bilateral basal ganglia infarcts, retinitis pigmentosa with resultant  blindness, VP shunt, arthritis, HTN, CKD IV, BPH, pericardial effusion,  colon polyps, prior GIB in 01/2015 ),  PAF, and COPD admitted after  suffering an in home cardiac arrest.Intubated 10/3-10/6. CXR persistent  pneumonia in the mid and lower right lung at and in the left lower lung.  Moderate-sized right pleural effusion. Persistent pulmonary vascular  congestion. BSE 09/22/13 recommended regular diet (ground meats), thin.    MBS indicated due to dysphagia symptoms.    No Data Recorded  Assessment / Plan / Recommendation CHL IP CLINICAL IMPRESSIONS 09/18/2015  Therapy Diagnosis Mild oral phase dysphagia;Mild pharyngeal phase  dysphagia;Moderate pharyngeal phase dysphagia;Mild  cervical esophageal  phase dysphagia  Clinical Impression Pt with hyperextension of neck initially during MBS,  but corrected when consistencies presented with cues for neutral head  position/presentation of boluses; he exhibited decreased tongue base  retraction with mild vallecular residue with all consistencies, but  cleared with subsequent swallow which were independently initiated most of  the time, but verbal cues required intermittently during MBS for complete  vallecular clearance; mild oral residue noted as well, but cleared with  subsequent swallows; pt exhibited decreased airway closure with  nectar-thickened liquids via tsp/cup with resulting aspiration occurring  during the swallow; Honey-thickened liquids eliminated aspiration during  swallow with smaller amounts, but with larger volumes (large cup  sips/gulping), he penetrated into the laryngeal vestibule the residual  from pyriforms, he was able to clear valleculae/pyriform spaces with a dry  swallow after intake; C/P dysfunction cannot be r/o due to mild pyriform  residue with all consistencies, but cleared with subsequent swallows, so  alternating liquids and solids and smaller bites/sips recommended with  full supervision at meals for compensatory strategies.      CHL IP TREATMENT RECOMMENDATION 09/18/2015  Treatment Recommendations Therapy as outlined in treatment plan below     CHL IP DIET RECOMMENDATION 09/18/2015  SLP Diet Recommendations Dysphagia 2 (Fine chop);Honey  Liquid Administration via (None)  Medication Administration Crushed with puree  Compensations Slow rate;Small sips/bites;Effortful swallow  Postural Changes and/or Swallow Maneuvers 90 degrees upright     CHL IP OTHER RECOMMENDATIONS 09/18/2015  Recommended Consults (None)  Oral Care Recommendations Oral care BID  Other Recommendations (None)     CHL IP FOLLOW UP RECOMMENDATIONS 09/23/2013  Follow up Recommendations (No Data)     CHL IP FREQUENCY AND DURATION 09/18/2015  Speech  Therapy Frequency (ACUTE ONLY) min 2x/week  Treatment Duration 2 weeks     Pertinent Vitals/Pain     SLP Swallow Goals See POC     CHL IP REASON FOR REFERRAL 09/18/2015  Reason for Referral Objectively evaluate swallowing function     CHL IP ORAL PHASE 09/18/2015                    Oral Phase Impaired; see above                                                                            ADAMS,PAT, M.S., CCC-SLP 09/18/2015, 1:11 PM     Medications:  Scheduled: . antiseptic oral rinse  7 mL Mouth Rinse BID  . aspirin  81 mg Oral Daily  . carvedilol  12.5 mg Oral BID WC  . diltiazem  60 mg Oral Q12H  . [START ON 09/20/2015] doxercalciferol  7 mcg Intravenous  Q M,W,F-HD  . ferric gluconate (FERRLECIT/NULECIT) IV  62.5 mg Intravenous Q Wed-HD  . heparin  5,000 Units Subcutaneous 3 times per day  . methylPREDNISolone (SOLU-MEDROL) injection  40 mg Intravenous Q24H  . pantoprazole sodium  40 mg Per Tube Q24H   Continuous:  ZYS:AYTKZS chloride, sodium chloride, acetaminophen (TYLENOL) oral liquid 160 mg/5 mL, albuterol, alteplase, heparin, hydrALAZINE, lidocaine (PF), lidocaine-prilocaine, pentafluoroprop-tetrafluoroeth, RESOURCE THICKENUP CLEAR, sodium chloride  Assessment/Plan:  Active Problems:   BLINDNESS, LEGAL, [place] DEFINITION   COPD (chronic obstructive pulmonary disease) (HCC)   Cardiac arrest (HCC)   Acute respiratory failure (HCC)   End-stage renal disease on hemodialysis ([place])   HCAP (healthcare-associated pneumonia)   Anemia of chronic disease   Dysphagia    Acute respiratory failure with hypoxia Secondary to healthcare associated pneumonia. Patient was extubated on October 6. Seems to be stable. Oxygen as needed.  Healthcare associated pneumonia Patient is slowly improving. Cultures were all negative. Change to oral antibiotics today in the form of Levaquin. His completed about 5 days of vancomycin and Zosyn.   Loose Stools/C. difficile colonization Could be secondary to  antibiotics. C. difficile antigen was positive but toxin was negative. Could suggest colonization. However, since he does have diarrhea, we will treat him with Flagyl for now.  Status post cardiac arrest Secondary to respiratory failure. Seems to be stable. Echocardiogram showed normal systolic function without any wall motion abnormalities. Continue to watch.  Right pleural effusion Chronic per pulmonology.  Chronic diastolic dysfunction Stable. Volume management with dialysis.  History of paroxysmal atrial fibrillation Rate is reasonably well controlled. Continue beta blocker and calcium channel blocker. Patient not noted to be on anticoagulation. Patient followed by Dr. Doylene Canard. Will defer to him.  End-stage renal disease on hemodialysis Patient is dialyzed Monday, Wednesdays and Fridays. Nephrology is following.  Dysphagia Speech pathology is following. Started on Dysphagia diet.   Anemia of chronic disease Hemoglobin is stable. Continue to monitor.  Acute encephalopathy This was secondary to acute illness. Mental status appears to be back to baseline.  History of retinitis pigmentosa, status post VP shunt Patient is blind as a result. Stable.  DVT Prophylaxis: Subcutaneous heparin    Code Status: Full code  Family Communication: Discussed with wife Disposition Plan: Seen by physical therapy. Discussed with his wife today. She states that she has to go to work every day. There is nobody to look after the patient when she is at work. PT does recommend supervision. In view of this patient will benefit from a short stint at a skilled nursing facility for rehabilitation. Patient's wife is agreeable with this plan. Will have social worker initiate this process.     LOS: 6 days   [facility] Pager [phone] 09/19/2015, 8:12 AM  If 7PM-7AM, please contact night-coverage at [email or web address], password [facility]

## 2015-09-19 NOTE — Progress Notes (Signed)
ANTIBIOTIC CONSULT NOTE - INITIAL  Pharmacy Consult for levofloxacin Indication: pneumonia  Allergies  Allergen Reactions  . Ibuprofen Other (See Comments)    Kidney problems-dialysis patient  . Nsaids Other (See Comments)    Kidney problems-dialysis patient    Patient Measurements: Height: 5\' 2"  (157.5 cm) Weight: 137 lb 5.6 oz (62.3 kg) IBW/kg (Calculated) : 54.6   Vital Signs: Temp: 98 F (36.7 C) (10/09 0357) Temp Source: Oral (10/09 0357) BP: 142/50 mmHg (10/09 0357) Pulse Rate: 62 (10/09 0357) Intake/Output from previous day: 10/08 0701 - 10/09 0700 In: 301 [P.O.:555; I.V.:80; IV Piggyback:150] Out: 2 [Stool:2] Intake/Output from this shift:    Labs:  Recent Labs  09/19/15 0458  WBC 6.7  HGB 9.0*  PLT 66*  CREATININE 8.13*   Estimated Creatinine Clearance: 6.6 mL/min (by C-G formula based on Cr of 8.13). No results for input(s): VANCOTROUGH, VANCOPEAK, VANCORANDOM, GENTTROUGH, GENTPEAK, GENTRANDOM, TOBRATROUGH, TOBRAPEAK, TOBRARND, AMIKACINPEAK, AMIKACINTROU, AMIKACIN in the last 72 hours.   Microbiology: Recent Results (from the past 720 hour(s))  Culture, blood (routine x 2)     Status: None   Collection Time: 09/13/15 11:00 AM  Result Value Ref Range Status   Specimen Description BLOOD CENTRAL LINE  Final   Special Requests BOTTLES DRAWN AEROBIC AND ANAEROBIC 5CC  Final   Culture NO GROWTH 5 DAYS  Final   Report Status 09/18/2015 FINAL  Final  MRSA PCR Screening     Status: None   Collection Time: 09/13/15 11:55 AM  Result Value Ref Range Status   MRSA by PCR NEGATIVE NEGATIVE Final    Comment:        The GeneXpert MRSA Assay (FDA approved for NASAL specimens only), is one component of a comprehensive MRSA colonization surveillance program. It is not intended to diagnose MRSA infection nor to guide or monitor treatment for MRSA infections.   Culture, Urine     Status: None   Collection Time: 09/13/15 11:57 AM  Result Value Ref Range  Status   Specimen Description URINE, CATHETERIZED  Final   Special Requests Normal  Final   Culture NO GROWTH 1 DAY  Final   Report Status 09/14/2015 FINAL  Final  Culture, respiratory (NON-Expectorated)     Status: None   Collection Time: 09/13/15 12:03 PM  Result Value Ref Range Status   Specimen Description TRACHEAL ASPIRATE  Final   Special Requests NONE  Final   Gram Stain   Final    RARE WBC PRESENT, PREDOMINANTLY PMN RARE SQUAMOUS EPITHELIAL CELLS PRESENT FEW GRAM POSITIVE COCCI IN PAIRS RARE GRAM NEGATIVE RODS Performed at [facility]    Culture   Final    Non-Pathogenic Oropharyngeal-type Flora Isolated. Performed at [facility]    Report Status 09/16/2015 FINAL  Final  Culture, blood (routine x 2)     Status: None   Collection Time: 09/13/15 12:43 PM  Result Value Ref Range Status   Specimen Description BLOOD RIGHT HAND  Final   Special Requests BOTTLES DRAWN AEROBIC ONLY 5CC  Final   Culture NO GROWTH 5 DAYS  Final   Report Status 09/18/2015 FINAL  Final  C difficile quick scan w PCR reflex     Status: Abnormal   Collection Time: 09/19/15  6:49 AM  Result Value Ref Range Status   C Diff antigen POSITIVE (A) NEGATIVE Final   C Diff toxin NEGATIVE NEGATIVE Final   C Diff interpretation   Final    C. difficile present, but toxin  not detected. This indicates colonization. In most cases, this does not require treatment. If patient has signs and symptoms consistent with colitis, consider treatment. Requires ENTERIC precautions.    Medical History: Past Medical History  Diagnosis Date  . Anemia   . Diastolic dysfunction   . Retinitis pigmentosa     Blindness--has shunt --placed yrs ago in [place]..  . Arthritis     Gout  . Hypertension     dx--"long time"  . CKD (chronic kidney disease), stage IV ([place])     a. L upper extremity AV fistula created 07/2012.  [name] BPH (benign prostatic hyperplasia)   . Blind   . Gangrene of finger ([place])     a. L  small finger gangrene 12/2012 s/p amputation.  . Colon polyps   . Pericardial effusion     a. Noted on echo 10/2009. b. Again seen on echo 09/2013.  [name] CHF (congestive heart failure) ([place])   . COPD (chronic obstructive pulmonary disease) ([place])   . GI (gastrointestinal bleed) feb 2016  . Irregular heart rate 03/03/2015    Medications:  Prescriptions prior to admission  Medication Sig Dispense Refill Last Dose  . acetaminophen (TYLENOL) 500 MG tablet Take 1,000 mg by mouth every 6 (six) hours as needed for moderate pain.   Past Week at Unknown time  . albuterol (PROVENTIL HFA;VENTOLIN HFA) 108 (90 BASE) MCG/ACT inhaler Inhale 1-2 puffs into the lungs every 6 (six) hours as needed for wheezing or shortness of breath.   awhile  . allopurinol (ZYLOPRIM) 100 MG tablet Take 100 mg by mouth daily.    09/12/2015 at Unknown time  . aspirin 81 MG tablet Take 81 mg by mouth daily.   09/11/2015  . carvedilol (COREG) 25 MG tablet Take 1 tablet (25 mg total) by mouth 2 (two) times daily with a meal. 60 tablet 0 09/12/2015 at 1730  . diltiazem (CARDIZEM) 60 MG tablet Take 60 mg by mouth 2 (two) times daily.  0 09/12/2015 at Unknown time  . diphenhydrAMINE (BENADRYL) 25 MG tablet Take 25 mg by mouth every 6 (six) hours as needed for itching or allergies (congestion).    awhile  . doxercalciferol (HECTOROL) 4 MCG/2ML injection Inject 0.5 mLs (1 mcg total) into the vein every Monday, Wednesday, and Friday with hemodialysis. 2 mL  09/10/2015  . ferric gluconate 125 mg in sodium chloride 0.9 % 100 mL Inject 125 mg into the vein every Monday, Wednesday, and Friday with hemodialysis.   09/10/2015  . guaiFENesin (MUCINEX) 600 MG 12 hr tablet Take 1 tablet (600 mg total) by mouth 2 (two) times daily as needed (congestion).   09/12/2015 at Unknown time  . Multiple Vitamin (MULTIVITAMIN WITH MINERALS) TABS tablet Take 1 tablet by mouth daily.   09/12/2015 at Unknown time  . SPIRIVA RESPIMAT 2.5 MCG/ACT AERS INHALE 2 PUFFS INTO  THE LUNGS EVERY MORNING 4 Inhaler 0 09/12/2015 at Unknown time  . [DISCONTINUED] ciprofloxacin (CIPRO) 250 MG tablet Take 1 tablet (250 mg total) by mouth 2 (two) times daily. (Patient not taking: Reported on 09/13/2015) 6 tablet 0   . [DISCONTINUED] diltiazem (CARDIZEM LA) 120 MG 24 hr tablet Take 1 tablet (120 mg total) by mouth daily. (Patient not taking: Reported on 09/13/2015) 30 tablet 1   . [DISCONTINUED] ipratropium-albuterol (DUONEB) 0.5-2.5 (3) MG/3ML SOLN Take 3 mLs by nebulization every 4 (four) hours as needed (shortness of breath/wheezing). (Patient not taking: Reported on 09/13/2015) 360 mL 2   . [DISCONTINUED] multivitamin (RENA-VIT)  TABS tablet Take 1 tablet by mouth at bedtime. (Patient not taking: Reported on 09/13/2015) 30 tablet 0 02/15/2015  . [DISCONTINUED] Nutritional Supplements (FEEDING SUPPLEMENT, NEPRO CARB STEADY,) LIQD Take 237 mLs by mouth 2 (two) times daily between meals. (Patient not taking: Reported on 02/16/2015) 30 Can 0 Not Taking  . [DISCONTINUED] pantoprazole (PROTONIX) 40 MG tablet Take 1 tablet (40 mg total) by mouth 2 (two) times daily. (Patient not taking: Reported on 02/16/2015) 60 tablet 0 Not Taking  . [DISCONTINUED] ranitidine (ZANTAC) 150 MG tablet Take 1 tablet (150 mg total) by mouth 2 (two) times daily. (Patient not taking: Reported on 09/13/2015) 60 tablet 3    Assessment: 69 y/o male with ESRD on MWF-HD. Completed 4 days of vancomycin and 6 days of Zosyn for HAP. He is afebrile, WBC are normal, and PCT is trending down (19.28 > 6.5). Cultures are NGTD. MRSA PCR negative. Pharmacy consulted to dose PO levofloxacin for CAP.  Goal of Therapy:  Resolution of infection  Plan:  Levofloxacin 750 mg PO x1 Levofloxacin 500 mg PO q48h Expected duration 7 days with resolution of temperature and/or normalization of WBC Follow up culture results, LOT  Governor Specking, PharmD Clinical Pharmacy Resident Pager: [phone] 09/19/2015,8:23 AM

## 2015-09-19 NOTE — Progress Notes (Addendum)
Subjective:   Alert, talkative  Objective Filed Vitals:   09/19/15 0004 09/19/15 0357 09/19/15 0844 09/19/15 1303  BP: 148/45 142/50 174/53 159/48  Pulse: 60 62  57  Temp: 97.8 F (36.6 C) 98 F (36.7 C) 97.6 F (36.4 C) 97.4 F (36.3 C)  TempSrc: Oral Oral Oral Oral  Resp: 18 18 18 20   Height:      Weight:  62.3 kg (137 lb 5.6 oz)    SpO2: 99% 99% 100% 98%   Physical Exam General:  Alert and oriented, no acute distress Heart: RRR  Lungs: CTA, unlabored  Abdomen: soft, nontender +BS  Extremities: no edema  Dialysis Access:  L AVf +b/t   [facility] 4h 65.5kg 2/2.25 bath Heparin none LUA AVF Hect 7 ug Venofer 50/ wk Mircera 200 every 2 wks  Assessment: 1. Cardiac arrest (asystole)- arrested at home 2. HCAP- on vanc and zosyn/ afebrile 2. ESRD - MWF [place]- next HD Monday.  3. Anemia - hgb 8.6- cont Fe- micera last given 9.30 4. Secondary hyperparathyroidism - phos 3.6. Cont hectorol. No binders 5. HTN/volume - coreg, cardizem and prn hydralazine. Under dry wt 6. Nutrition -  NPO per swallow eval- alb 3.1 7. DM 8. AMS - better, prob close to baseline 9. Diarrhea - +Cdif antigen only, on po flagyl since is having diarrhea 10. Dispo - working w PT, they recommend SNF vs home w [place] - HD tomorrow, UF 1-2 kg  Kelly Splinter MD [place] pager [phone]    cell [phone] 09/19/2015, 1:41 PM   Additional Objective Labs: Basic Metabolic Panel:  Recent Labs Lab 09/14/15 0335 09/15/15 0513 09/16/15 0430 09/19/15 0458  NA 136 135 136 137  K 3.9 3.8 3.7 3.8  CL 93* 91* 96* 96*  CO2 28 26 25 23   GLUCOSE 97 124* 113* 118*  BUN 24* 68* 37* 79*  CREATININE 6.35* 8.23* 4.49* 8.13*  CALCIUM 8.7* 8.2* 8.0* 8.2*  PHOS 4.8* 6.0* 3.6  --    Liver Function Tests:  Recent Labs Lab 09/13/15 0657 09/15/15 0513 09/16/15 0430  AST 121*  --   --   ALT 111*  --   --   ALKPHOS 53  --   --   BILITOT 0.8  --   --   PROT 6.2*  --   --   ALBUMIN  2.8* 2.7* 3.1*   No results for input(s): LIPASE, AMYLASE in the last 168 hours. CBC:  Recent Labs Lab 09/13/15 0657  09/14/15 0335 09/15/15 0513 09/16/15 0430 09/19/15 0458  WBC 9.1  --  6.7 6.1 10.2 6.7  NEUTROABS 8.2*  --   --   --   --   --   HGB 8.1*  < > 8.7* 7.6* 8.6* 9.0*  HCT 26.3*  < > 28.0* 23.5* 27.3* 27.4*  MCV 100.8*  --  97.2 96.7 99.6 98.6  PLT 75*  --  71* 73* 78* 66*  < > = values in this interval not displayed. Blood Culture    Component Value Date/Time   SDES BLOOD RIGHT HAND 09/13/2015 1243   SPECREQUEST BOTTLES DRAWN AEROBIC ONLY 5CC 09/13/2015 1243   CULT NO GROWTH 5 DAYS 09/13/2015 1243   REPTSTATUS 09/18/2015 FINAL 09/13/2015 1243    Cardiac Enzymes:  Recent Labs Lab 09/13/15 1030 09/13/15 1535 09/13/15 2130 09/14/15 0335  TROPONINI 0.06* 0.10* 0.13* 0.09*   CBG:  Recent Labs Lab 09/18/15 1618 09/18/15 2154 09/18/15 2356 09/19/15 0406 09/19/15 3734  GLUCAP 82 123* 126* 111* 105*   Iron Studies: No results for input(s): IRON, TIBC, TRANSFERRIN, FERRITIN in the last 72 hours. @lablastinr3 @ Studies/Results: Dg Swallowing Func-speech Pathology  09/18/2015    Objective Swallowing Evaluation:    Patient Details  Name: Cameron Mendez MRN: [id number] Date of Birth: [birth year]  Today's Date: 09/18/2015 Time: SLP Start Time (ACUTE ONLY): 1145-SLP Stop Time (ACUTE ONLY): 1200 SLP Time Calculation (min) (ACUTE ONLY): 15 min  Past Medical History:  Past Medical History  Diagnosis Date  . Anemia   . Diastolic dysfunction   . Retinitis pigmentosa     Blindness--has shunt --placed yrs ago in [place]..  . Arthritis     Gout  . Hypertension     dx--"long time"  . CKD (chronic kidney disease), stage IV ([address])     a. L upper extremity AV fistula created 07/2012.  [name] BPH (benign prostatic hyperplasia)   . Blind   . Gangrene of finger ([place])     a. 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  . Colon polyps   . Pericardial effusion     a. Noted on echo 10/2009. b.  Again seen on echo 09/2013.  [name] CHF (congestive heart failure) ([place])   . COPD (chronic obstructive pulmonary disease) ([place])   . GI (gastrointestinal bleed) feb 2016  . Irregular heart rate 03/03/2015   Past Surgical History:  Past Surgical History  Procedure Laterality Date  . Cataract extraction w/ intraocular lens  implant, bilateral Bilateral   . Knee ligament reconstruction Left   . Av fistula placement  07/15/2012    Procedure: ARTERIOVENOUS (AV) FISTULA CREATION;  Surgeon: Rosetta Posner,  MD;  Location: [facility];  Service: Vascular;  Laterality: Left;  . Amputation  12/30/2012    Procedure: AMPUTATION DIGIT;  Surgeon: Tennis Must, MD;  Location:  [facility];  Service: Orthopedics;  Laterality: Left;  LEFT  SMALL FINGER AMPUTATION  . Pericardiocentesis  09/2013  . Pericardial tap N/A 09/19/2013    Procedure: PERICARDIAL TAP;  Surgeon: Blane Ohara, MD;  Location:  [facility] CATH LAB;  Service: Cardiovascular;  Laterality: N/A;   HPI:  Other Pertinent Information: 69 y/o with a PMH of smoking, remote  bilateral basal ganglia infarcts, retinitis pigmentosa with resultant  blindness, VP shunt, arthritis, HTN, CKD IV, BPH, pericardial effusion,  colon polyps, prior GIB in 01/2015 ), PAF, and COPD admitted after  suffering an in home cardiac arrest.Intubated 10/3-10/6. CXR persistent  pneumonia in the mid and lower right lung at and in the left lower lung.  Moderate-sized right pleural effusion. Persistent pulmonary vascular  congestion. BSE 09/22/13 recommended regular diet (ground meats), thin.    MBS indicated due to dysphagia symptoms.    No Data Recorded  Assessment / Plan / Recommendation CHL IP CLINICAL IMPRESSIONS 09/18/2015  Therapy Diagnosis Mild oral phase dysphagia;Mild pharyngeal phase  dysphagia;Moderate pharyngeal phase dysphagia;Mild cervical esophageal  phase dysphagia  Clinical Impression Pt with hyperextension of neck initially during MBS,  but corrected when consistencies presented with cues  for neutral head  position/presentation of boluses; he exhibited decreased tongue base  retraction with mild vallecular residue with all consistencies, but  cleared with subsequent swallow which were independently initiated most of  the time, but verbal cues required intermittently during MBS for complete  vallecular clearance; mild oral residue noted as well, but cleared with  subsequent swallows; pt exhibited decreased airway closure with  nectar-thickened liquids via tsp/cup with resulting aspiration  occurring  during the swallow; Honey-thickened liquids eliminated aspiration during  swallow with smaller amounts, but with larger volumes (large cup  sips/gulping), he penetrated into the laryngeal vestibule the residual  from pyriforms, he was able to clear valleculae/pyriform spaces with a dry  swallow after intake; C/P dysfunction cannot be r/o due to mild pyriform  residue with all consistencies, but cleared with subsequent swallows, so  alternating liquids and solids and smaller bites/sips recommended with  full supervision at meals for compensatory strategies.      CHL IP TREATMENT RECOMMENDATION 09/18/2015  Treatment Recommendations Therapy as outlined in treatment plan below     CHL IP DIET RECOMMENDATION 09/18/2015  SLP Diet Recommendations Dysphagia 2 (Fine chop);Honey  Liquid Administration via (None)  Medication Administration Crushed with puree  Compensations Slow rate;Small sips/bites;Effortful swallow  Postural Changes and/or Swallow Maneuvers 90 degrees upright     CHL IP OTHER RECOMMENDATIONS 09/18/2015  Recommended Consults (None)  Oral Care Recommendations Oral care BID  Other Recommendations (None)     CHL IP FOLLOW UP RECOMMENDATIONS 09/23/2013  Follow up Recommendations (No Data)     CHL IP FREQUENCY AND DURATION 09/18/2015  Speech Therapy Frequency (ACUTE ONLY) min 2x/week  Treatment Duration 2 weeks     Pertinent Vitals/Pain     SLP Swallow Goals See POC     CHL IP REASON FOR REFERRAL 09/18/2015   Reason for Referral Objectively evaluate swallowing function     CHL IP ORAL PHASE 09/18/2015                    Oral Phase Impaired; see above                                                                            ADAMS,PAT, M.S., CCC-SLP 09/18/2015, 1:11 PM    Medications:   . antiseptic oral rinse  7 mL Mouth Rinse BID  . aspirin  81 mg Oral Daily  . carvedilol  12.5 mg Oral BID WC  . diltiazem  60 mg Oral Q12H  . [START ON 09/20/2015] doxercalciferol  7 mcg Intravenous Q M,W,F-HD  . ferric gluconate (FERRLECIT/NULECIT) IV  62.5 mg Intravenous Q Wed-HD  . heparin  5,000 Units Subcutaneous 3 times per day  . [START ON 09/21/2015] levofloxacin  500 mg Oral Q48H  . methylPREDNISolone (SOLU-MEDROL) injection  40 mg Intravenous Q24H  . metroNIDAZOLE  500 mg Oral 3 times per day  . pantoprazole sodium  40 mg Per Tube Q24H  . saccharomyces boulardii  250 mg Oral BID

## 2015-09-20 LAB — GLUCOSE, CAPILLARY
GLUCOSE-CAPILLARY: 103 mg/dL — AB (ref 65–99)
Glucose-Capillary: 140 mg/dL — ABNORMAL HIGH (ref 65–99)

## 2015-09-20 LAB — BASIC METABOLIC PANEL
ANION GAP: 20 — AB (ref 5–15)
BUN: 107 mg/dL — ABNORMAL HIGH (ref 6–20)
CALCIUM: 8.2 mg/dL — AB (ref 8.9–10.3)
CO2: 20 mmol/L — ABNORMAL LOW (ref 22–32)
CREATININE: 10.05 mg/dL — AB (ref 0.61–1.24)
Chloride: 101 mmol/L (ref 101–111)
GFR, EST AFRICAN AMERICAN: 5 mL/min — AB (ref >60)
GFR, EST NON AFRICAN AMERICAN: 5 mL/min — AB (ref >60)
GLUCOSE: 107 mg/dL — AB (ref 65–99)
Potassium: 3.8 mmol/L (ref 3.5–5.1)
Sodium: 141 mmol/L (ref 135–145)

## 2015-09-20 MED ORDER — PENTAFLUOROPROP-TETRAFLUOROETH EX AERO: 1.0000 "application " | INHALATION_SPRAY | CUTANEOUS | Status: DC | PRN

## 2015-09-20 MED ORDER — SODIUM CHLORIDE 0.9 % IV SOLN: 100.0000 mL | INTRAVENOUS | Status: DC | PRN

## 2015-09-20 MED ORDER — HEPARIN SODIUM (PORCINE) 1000 UNIT/ML DIALYSIS: 1000.0000 [IU] | INTRAMUSCULAR | Status: DC | PRN

## 2015-09-20 MED ORDER — PREDNISONE 20 MG PO TABS
20.0000 mg | ORAL_TABLET | Freq: Every day | ORAL | Status: DC
  Administered 2015-09-21: 20 mg via ORAL
  Filled 2015-09-20: qty 1

## 2015-09-20 MED ORDER — DOXERCALCIFEROL 4 MCG/2ML IV SOLN
INTRAVENOUS | Status: AC
  Administered 2015-09-20: 7 ug via INTRAVENOUS
  Filled 2015-09-20: qty 4

## 2015-09-20 MED ORDER — LIDOCAINE-PRILOCAINE 2.5-2.5 % EX CREA
1.0000 "application " | TOPICAL_CREAM | CUTANEOUS | Status: DC | PRN
  Filled 2015-09-20: qty 5

## 2015-09-20 MED ORDER — LIDOCAINE HCL (PF) 1 % IJ SOLN: 5.0000 mL | INTRAMUSCULAR | Status: DC | PRN

## 2015-09-20 MED ORDER — ALTEPLASE 2 MG IJ SOLR
2.0000 mg | Freq: Once | INTRAMUSCULAR | Status: DC | PRN
Start: 2015-09-20 — End: 2015-09-20
  Filled 2015-09-20: qty 2

## 2015-09-20 NOTE — Progress Notes (Signed)
SLP Cancellation Note  Patient Details Name: Cameron Mendez MRN: [id number] DOB: Aug 12, 1946   Cancelled treatment:       Reason Eval/Treat Not Completed: Patient at procedure or test/unavailable (HD)   Germain Osgood, M.A. CCC-SLP [phone]  Germain Osgood 09/20/2015, 11:02 AM

## 2015-09-20 NOTE — Progress Notes (Signed)
OT Cancellation Note  Patient Details Name: SHAMON COTHRAN MRN: [id number] DOB: 1946/11/02   Cancelled Treatment:    Reason Eval/Treat Not Completed: Patient at procedure or test/ unavailable. Pt at dialysis. Will check back for OT eval as schedule allows.  Breanna Mcdaniel , MS, OTR/L, CLT Pager: [phone]  09/20/2015, 10:41 AM

## 2015-09-20 NOTE — Progress Notes (Signed)
CSW met with pt and wife to discuss plan for time of DC.  Pt is adamantly refusing SNF placement and wife is supportive of this decision- states she can take some time off work and that pt has a sister who has stated she can come watch pt some days.  RNCM informed of plan to DC home- pt has used [facility] in the past and is agreeable to home health services after this admission  CSW signing off.  Domenica Reamer, [place] Social Worker [phone]

## 2015-09-20 NOTE — Procedures (Signed)
I was present at this dialysis session. I have reviewed the session itself and made appropriate changes. Doing well, stable BPs.    58M s/p cardiac arrest, recovered, now awaiting SNF disposition.  Also with HCAP, Anemia on ESA, diarrhea.    Pearson Grippe  MD 09/20/2015, 8:43 AM

## 2015-09-20 NOTE — Progress Notes (Signed)
[facility] PROGRESS NOTE  Cameron Mendez YKZ:[id number] DOB: September 08, 1946 DOA: 09/13/2015  PCP: [name], MD  Brief HPI: 69 year old African-American male with a past medical history of retinitis pigmentosa with resultant blindness, VP shunt, arthritis, end-stage renal disease on hemodialysis, paroxysmal atrial fibrillation not on anticoagulation, history of COPD presented after hours sustaining a cardiac arrest at home. Apparently patient had been getting short of breath about 3 days prior to admission. He had missed dialysis. Cardiac arrest was thought to be secondary to respiratory distress. Patient was successfully resuscitated. He was intubated and on mechanical ventilation. He was in the intensive care unit. Subsequently extubated and then transferred to the floor.  Past medical history:  Past Medical History  Diagnosis Date  . Anemia   . Diastolic dysfunction   . Retinitis pigmentosa     Blindness--has shunt --placed yrs ago in [place]..  . Arthritis     Gout  . Hypertension     dx--"long time"  . CKD (chronic kidney disease), stage IV ([place])     a. L upper extremity AV fistula created 07/2012.  [name] BPH (benign prostatic hyperplasia)   . Blind   . Gangrene of finger ([place])     a. 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  . Colon polyps   . Pericardial effusion     a. Noted on echo 10/2009. b. Again seen on echo 09/2013.  [name] CHF (congestive heart failure) ([place])   . COPD (chronic obstructive pulmonary disease) ([place])   . GI (gastrointestinal bleed) feb 2016  . Irregular heart rate 03/03/2015    Consultants: Nephrology  Procedures: Hemodialysis MWF  Antibiotics: On vancomycin and Zosyn till 10/9 Augmentin 10/9  Subjective: Patient denies any complaints. No cough, chest pain, shortness of breath. No nausea, vomiting. Apparently had multiple loose stools overnight. But none during dialysis for the past 4 hours. Denies abdominal pain.  Objective: Vital  Signs  Filed Vitals:   09/20/15 0951 09/20/15 1020 09/20/15 1050 09/20/15 1111  BP: 124/54 114/62 121/54 127/72  Pulse: 63 70 73 65  Temp:      TempSrc:      Resp:      Height:      Weight:      SpO2:        Intake/Output Summary (Last 24 hours) at 09/20/15 1114 Last data filed at 09/20/15 0200  Gross per 24 hour  Intake    460 ml  Output      0 ml  Net    460 ml   Filed Weights   09/19/15 0357 09/20/15 0425 09/20/15 0711  Weight: 62.3 kg (137 lb 5.6 oz) 59.7 kg (131 lb 9.8 oz) 60.2 kg (132 lb 11.5 oz)    General appearance: alert, cooperative, appears stated age and no distress Resp: Improved air entry bilaterally. Rhonchi still appreciated bilateral bases, left more than right. But better than yesterday. Diminished air entry on the right. Cardio: regular rate and rhythm, S1, S2 normal, no murmur, click, rub or gallop GI: soft, non-tender; bowel sounds normal; no masses,  no organomegaly Extremities: extremities normal, atraumatic, no cyanosis or edema Neurologic: Alert. Oriented to place, year, month person. No obvious focal neurological deficits appreciated on examination. He is legally blind.  Lab Results:  Basic Metabolic Panel:  Recent Labs Lab 09/14/15 0335 09/15/15 0513 09/16/15 0430 09/19/15 0458 09/20/15 0725  NA 136 135 136 137 141  K 3.9 3.8 3.7 3.8 3.8  CL 93* 91* 96* 96* 101  CO2 28 26 25 23  20*  GLUCOSE 97 124* 113* 118* 107*  BUN 24* 68* 37* 79* 107*  CREATININE 6.35* 8.23* 4.49* 8.13* 10.05*  CALCIUM 8.7* 8.2* 8.0* 8.2* 8.2*  MG 2.5*  --   --   --   --   PHOS 4.8* 6.0* 3.6  --   --    Liver Function Tests:  Recent Labs Lab 09/15/15 0513 09/16/15 0430  ALBUMIN 2.7* 3.1*   CBC:  Recent Labs Lab 09/14/15 0335 09/15/15 0513 09/16/15 0430 09/19/15 0458  WBC 6.7 6.1 10.2 6.7  HGB 8.7* 7.6* 8.6* 9.0*  HCT 28.0* 23.5* 27.3* 27.4*  MCV 97.2 96.7 99.6 98.6  PLT 71* 73* 78* 66*   Cardiac Enzymes:  Recent Labs Lab 09/13/15 1535  09/13/15 2130 09/14/15 0335  TROPONINI 0.10* 0.13* 0.09*   BNP (last 3 results)  Recent Labs  09/13/15 0657  BNP 1766.2*    CBG:  Recent Labs Lab 09/19/15 0406 09/19/15 0821 09/19/15 1544 09/20/15 0120 09/20/15 0648  GLUCAP 111* 105* 97 140* 103*    Recent Results (from the past 240 hour(s))  Culture, blood (routine x 2)     Status: None   Collection Time: 09/13/15 11:00 AM  Result Value Ref Range Status   Specimen Description BLOOD CENTRAL LINE  Final   Special Requests BOTTLES DRAWN AEROBIC AND ANAEROBIC 5CC  Final   Culture NO GROWTH 5 DAYS  Final   Report Status 09/18/2015 FINAL  Final  MRSA PCR Screening     Status: None   Collection Time: 09/13/15 11:55 AM  Result Value Ref Range Status   MRSA by PCR NEGATIVE NEGATIVE Final    Comment:        The GeneXpert MRSA Assay (FDA approved for NASAL specimens only), is one component of a comprehensive MRSA colonization surveillance program. It is not intended to diagnose MRSA infection nor to guide or monitor treatment for MRSA infections.   Culture, Urine     Status: None   Collection Time: 09/13/15 11:57 AM  Result Value Ref Range Status   Specimen Description URINE, CATHETERIZED  Final   Special Requests Normal  Final   Culture NO GROWTH 1 DAY  Final   Report Status 09/14/2015 FINAL  Final  Culture, respiratory (NON-Expectorated)     Status: None   Collection Time: 09/13/15 12:03 PM  Result Value Ref Range Status   Specimen Description TRACHEAL ASPIRATE  Final   Special Requests NONE  Final   Gram Stain   Final    RARE WBC PRESENT, PREDOMINANTLY PMN RARE SQUAMOUS EPITHELIAL CELLS PRESENT FEW GRAM POSITIVE COCCI IN PAIRS RARE GRAM NEGATIVE RODS Performed at [facility]    Culture   Final    Non-Pathogenic Oropharyngeal-type [place] Isolated. Performed at [facility]    Report Status 09/16/2015 FINAL  Final  Culture, blood (routine x 2)     Status: None   Collection Time: 09/13/15  12:43 PM  Result Value Ref Range Status   Specimen Description BLOOD RIGHT HAND  Final   Special Requests BOTTLES DRAWN AEROBIC ONLY 5CC  Final   Culture NO GROWTH 5 DAYS  Final   Report Status 09/18/2015 FINAL  Final  C difficile quick scan w PCR reflex     Status: Abnormal   Collection Time: 09/19/15  6:49 AM  Result Value Ref Range Status   C Diff antigen POSITIVE (A) NEGATIVE Final   C Diff toxin NEGATIVE NEGATIVE Final  C Diff interpretation   Final    C. difficile present, but toxin not detected. This indicates colonization. In most cases, this does not require treatment. If patient has signs and symptoms consistent with colitis, consider treatment. Requires ENTERIC precautions.      Studies/Results: Dg Swallowing Func-speech Pathology  09/18/2015    Objective Swallowing Evaluation:    Patient Details  Name: Cameron Mendez MRN: [id number] Date of Birth: Jul 08, 1946  Today's Date: 09/18/2015 Time: SLP Start Time (ACUTE ONLY): 1145-SLP Stop Time (ACUTE ONLY): 1200 SLP Time Calculation (min) (ACUTE ONLY): 15 min  Past Medical History:  Past Medical History  Diagnosis Date  . Anemia   . Diastolic dysfunction   . Retinitis pigmentosa     Blindness--has shunt --placed yrs ago in [place]..  . Arthritis     Gout  . Hypertension     dx--"long time"  . CKD (chronic kidney disease), stage IV ([place])     a. L upper extremity AV fistula created 07/2012.  [name] BPH (benign prostatic hyperplasia)   . Blind   . Gangrene of finger ([place])     a. 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  . Colon polyps   . Pericardial effusion     a. Noted on echo 10/2009. b. Again seen on echo 09/2013.  [name] CHF (congestive heart failure) ([place])   . COPD (chronic obstructive pulmonary disease) ([place])   . GI (gastrointestinal bleed) feb 2016  . Irregular heart rate 03/03/2015   Past Surgical History:  Past Surgical History  Procedure Laterality Date  . Cataract extraction w/ intraocular lens  implant, bilateral Bilateral   . Knee ligament  reconstruction Left   . Av fistula placement  07/15/2012    Procedure: ARTERIOVENOUS (AV) FISTULA CREATION;  Surgeon: Rosetta Posner,  MD;  Location: [facility];  Service: Vascular;  Laterality: Left;  . Amputation  12/30/2012    Procedure: AMPUTATION DIGIT;  Surgeon: Tennis Must, MD;  Location:  [facility];  Service: Orthopedics;  Laterality: Left;  LEFT  SMALL FINGER AMPUTATION  . Pericardiocentesis  09/2013  . Pericardial tap N/A 09/19/2013    Procedure: PERICARDIAL TAP;  Surgeon: Blane Ohara, MD;  Location:  [facility] CATH LAB;  Service: Cardiovascular;  Laterality: N/A;   HPI:  Other Pertinent Information: 69 y/o with a PMH of smoking, remote  bilateral basal ganglia infarcts, retinitis pigmentosa with resultant  blindness, VP shunt, arthritis, HTN, CKD IV, BPH, pericardial effusion,  colon polyps, prior GIB in 01/2015 ), PAF, and COPD admitted after  suffering an in home cardiac arrest.Intubated 10/3-10/6. CXR persistent  pneumonia in the mid and lower right lung at and in the left lower lung.  Moderate-sized right pleural effusion. Persistent pulmonary vascular  congestion. BSE 09/22/13 recommended regular diet (ground meats), thin.    MBS indicated due to dysphagia symptoms.    No Data Recorded  Assessment / Plan / Recommendation CHL IP CLINICAL IMPRESSIONS 09/18/2015  Therapy Diagnosis Mild oral phase dysphagia;Mild pharyngeal phase  dysphagia;Moderate pharyngeal phase dysphagia;Mild cervical esophageal  phase dysphagia  Clinical Impression Pt with hyperextension of neck initially during MBS,  but corrected when consistencies presented with cues for neutral head  position/presentation of boluses; he exhibited decreased tongue base  retraction with mild vallecular residue with all consistencies, but  cleared with subsequent swallow which were independently initiated most of  the time, but verbal cues required intermittently during MBS for complete  vallecular clearance; mild oral residue noted as  well,  but cleared with  subsequent swallows; pt exhibited decreased airway closure with  nectar-thickened liquids via tsp/cup with resulting aspiration occurring  during the swallow; Honey-thickened liquids eliminated aspiration during  swallow with smaller amounts, but with larger volumes (large cup  sips/gulping), he penetrated into the laryngeal vestibule the residual  from pyriforms, he was able to clear valleculae/pyriform spaces with a dry  swallow after intake; C/P dysfunction cannot be r/o due to mild pyriform  residue with all consistencies, but cleared with subsequent swallows, so  alternating liquids and solids and smaller bites/sips recommended with  full supervision at meals for compensatory strategies.      CHL IP TREATMENT RECOMMENDATION 09/18/2015  Treatment Recommendations Therapy as outlined in treatment plan below     CHL IP DIET RECOMMENDATION 09/18/2015  SLP Diet Recommendations Dysphagia 2 (Fine chop);Honey  Liquid Administration via (None)  Medication Administration Crushed with puree  Compensations Slow rate;Small sips/bites;Effortful swallow  Postural Changes and/or Swallow Maneuvers 90 degrees upright     CHL IP OTHER RECOMMENDATIONS 09/18/2015  Recommended Consults (None)  Oral Care Recommendations Oral care BID  Other Recommendations (None)     CHL IP FOLLOW UP RECOMMENDATIONS 09/23/2013  Follow up Recommendations (No Data)     CHL IP FREQUENCY AND DURATION 09/18/2015  Speech Therapy Frequency (ACUTE ONLY) min 2x/week  Treatment Duration 2 weeks     Pertinent Vitals/Pain     SLP Swallow Goals See POC     CHL IP REASON FOR REFERRAL 09/18/2015  Reason for Referral Objectively evaluate swallowing function     CHL IP ORAL PHASE 09/18/2015                    Oral Phase Impaired; see above                                                                            ADAMS,PAT, M.S., CCC-SLP 09/18/2015, 1:11 PM     Medications:  Scheduled: . amoxicillin-clavulanate  1 tablet Oral Q24H  . antiseptic oral  rinse  7 mL Mouth Rinse BID  . aspirin  81 mg Oral Daily  . carvedilol  12.5 mg Oral BID WC  . diltiazem  60 mg Oral Q12H  . doxercalciferol      . doxercalciferol  7 mcg Intravenous Q M,W,F-HD  . ferric gluconate (FERRLECIT/NULECIT) IV  62.5 mg Intravenous Q Wed-HD  . heparin  5,000 Units Subcutaneous 3 times per day  . methylPREDNISolone (SOLU-MEDROL) injection  40 mg Intravenous Q24H  . metroNIDAZOLE  500 mg Oral 3 times per day  . pantoprazole sodium  40 mg Per Tube Q24H  . saccharomyces boulardii  250 mg Oral BID   Continuous:  OFB:PZWCHE chloride, sodium chloride, acetaminophen (TYLENOL) oral liquid 160 mg/5 mL, albuterol, alteplase, heparin, hydrALAZINE, lidocaine (PF), lidocaine-prilocaine, pentafluoroprop-tetrafluoroeth, RESOURCE THICKENUP CLEAR, sodium chloride  Assessment/Plan:  Active Problems:   BLINDNESS, LEGAL, [place] DEFINITION   COPD (chronic obstructive pulmonary disease) (HCC)   Cardiac arrest (HCC)   Acute respiratory failure (HCC)   End-stage renal disease on hemodialysis ([place])   HCAP (healthcare-associated pneumonia)   Anemia of chronic disease   Dysphagia    Acute respiratory failure with hypoxia Resolved.  This was secondary to healthcare associated pneumonia. Patient was extubated on October 6. Seems to be stable. Oxygen as needed.  Healthcare associated pneumonia Patient is slowly improving. Cultures were all negative. Initially changed to Levaquin. But was advised by pharmacist to change to Augmentin. Patient now on Augmentin. He has completed about 5 days of vancomycin and Zosyn.   Loose Stools/C. difficile colonization Could be secondary to antibiotics. C. difficile antigen was positive but toxin was negative. Could suggest colonization. However, since he does have diarrhea, we will treat him with Flagyl for now. Monitor stool output. Patient's abdomen is benign.  Status post cardiac arrest Secondary to respiratory failure. Seems to be stable.  Echocardiogram showed normal systolic function without any wall motion abnormalities. Continue to watch.  Right pleural effusion Chronic per pulmonology.  Chronic diastolic dysfunction Stable. Volume management with dialysis.  History of paroxysmal atrial fibrillation Rate is reasonably well controlled. Continue beta blocker and calcium channel blocker. Patient not noted to be on anticoagulation. Patient followed by Dr. Doylene Canard. Will defer to him.  End-stage renal disease on hemodialysis Patient is dialyzed Monday, Wednesdays and Fridays. Nephrology is following.  Dysphagia Speech pathology is following. Started on Dysphagia diet.   Anemia of chronic disease Hemoglobin is stable. Continue to monitor.  Acute encephalopathy This was secondary to acute illness. Mental status appears to be back to baseline.  History of retinitis pigmentosa, status post VP shunt Patient is blind as a result. Stable.  DVT Prophylaxis: Subcutaneous heparin    Code Status: Full code  Family Communication: Discussed with wife and patient Disposition Plan: Seen by physical therapy. Discussed with his wife yesterday. She states that she has to go to work every day. There is nobody to look after the patient when she is at work. PT does recommend supervision. In view of this patient will benefit from a short stint at a skilled nursing facility for rehabilitation. Patient's wife is agreeable with this plan. Patient agreeable as well. Anticipate discharge to skilled nursing facility tomorrow.     LOS: 7 days   [facility] Pager [phone] 09/20/2015, 11:14 AM  If 7PM-7AM, please contact night-coverage at [email or web address], password [facility]

## 2015-09-20 NOTE — Progress Notes (Signed)
Hemodialysis= Pt tolerated procedure well. Unable to meet goal d/t bp trending down and cramping in hands. Currently has no complaints other than "im uncomfortable in this bed." Frequently repositioned throughout treatment. Pt denies pain. Report given to primary RN.

## 2015-09-21 MED ORDER — CARVEDILOL 12.5 MG PO TABS: 12.5000 mg | ORAL_TABLET | Freq: Two times a day (BID) | ORAL | Status: AC

## 2015-09-21 MED ORDER — AMOXICILLIN-POT CLAVULANATE 500-125 MG PO TABS: 1.0000 | ORAL_TABLET | ORAL | Status: DC

## 2015-09-21 MED ORDER — PREDNISONE 20 MG PO TABS: 20.0000 mg | ORAL_TABLET | Freq: Every day | ORAL | Status: DC

## 2015-09-21 MED ORDER — SACCHAROMYCES BOULARDII 250 MG PO CAPS: 250.0000 mg | ORAL_CAPSULE | Freq: Two times a day (BID) | ORAL | Status: DC

## 2015-09-21 MED ORDER — METRONIDAZOLE 500 MG PO TABS: 500.0000 mg | ORAL_TABLET | Freq: Three times a day (TID) | ORAL | Status: DC

## 2015-09-21 NOTE — Progress Notes (Signed)
Notified spouse of pt's discharge.

## 2015-09-21 NOTE — Discharge Summary (Signed)
[facility]  Physician Discharge Summary   Patient ID: Cameron Mendez MRN: [id number] DOB/AGE: 01/24/1946 69 y.o.  Admit date: 09/13/2015 Discharge date: 09/21/2015  PCP: [name], MD  DISCHARGE DIAGNOSES:  Active Problems:   BLINDNESS, LEGAL, [place] DEFINITION   COPD (chronic obstructive pulmonary disease) ([place])   Cardiac arrest ([place])   Acute respiratory failure ([place])   End-stage renal disease on hemodialysis ([place])   HCAP (healthcare-associated pneumonia)   Anemia of chronic disease   Dysphagia   RECOMMENDATIONS FOR OUTPATIENT FOLLOW UP: 1. Patient refused placement in SNF. 2. Home health has been ordered 3. CBC and basic metabolic panel in 1 week   DISCHARGE CONDITION: fair  Diet recommendation: Dysphagia 2  Filed Weights   09/20/15 0711 09/20/15 1130 09/21/15 0447  Weight: 60.2 kg (132 lb 11.5 oz) 59.4 kg (130 lb 15.3 oz) 58.5 kg (128 lb 15.5 oz)    INITIAL HISTORY: 69 year old African-American male with a past medical history of retinitis pigmentosa with resultant blindness, VP shunt, arthritis, end-stage renal disease on hemodialysis, paroxysmal atrial fibrillation not on anticoagulation, history of COPD presented after hours sustaining a cardiac arrest at home. Apparently patient had been getting short of breath about 3 days prior to admission. He had missed dialysis. Cardiac arrest was thought to be secondary to respiratory distress. Patient was successfully resuscitated. He was intubated and on mechanical ventilation. He was in the intensive care unit. Subsequently extubated and then transferred to the floor.  Consultations:  Nephrology  Procedures:  Hemodialysis  HOSPITAL COURSE:   Acute respiratory failure with hypoxia Resolved. This was secondary to healthcare associated pneumonia. Patient was extubated on October 6. Seems to be stable. Saturating well on room air. Quick steroid taper.  Healthcare associated pneumonia Patient is slowly  improving. Cultures were all negative. Initially changed to Levaquin. But was advised by pharmacist to change to Augmentin. Patient now on Augmentin and will be discharged on the same.    Loose Stools/C. difficile colonization Could be secondary to antibiotics. C. difficile antigen was positive but toxin was negative. Could suggest colonization. However, since he does have diarrhea, Flagyl was initiated. His diarrhea is much improved. He'll be discharged on Flagyl.   Status post cardiac arrest Secondary to respiratory failure. Seems to be stable. Echocardiogram showed normal systolic function without any wall motion abnormalities.   Right pleural effusion Chronic per pulmonology.  Chronic diastolic dysfunction Stable. Volume management with dialysis.  History of paroxysmal atrial fibrillation Rate is reasonably well controlled. Continue beta blocker and calcium channel blocker. Patient not noted to be on anticoagulation. Defer this to the outpatient setting. Patient followed by Dr. Doylene Canard.   End-stage renal disease on hemodialysis Patient is dialyzed Monday, Wednesdays and Fridays. Nephrology has been following. He should resume his outpatient dialysis schedule.  Dysphagia Speech pathology has seen the patient. He was started on dysphagia 2 Diet. Home health will be ordered.   Anemia of chronic disease Hemoglobin is stable.  Acute encephalopathy This was secondary to acute illness. Mental status appears to be back to baseline.  History of retinitis pigmentosa, status post VP shunt Patient is blind as a result. Stable.  Patient was seen by physical and occupational therapy. Placement to skilled nursing facility was recommended. However, patient wants to go home. This was discussed by case management with his wife. Apparently she will take a few days off from work. Apparently there are other family members to provide assistance. He stable for discharge from the hospital.   PERTINENT  LABS:  The results of significant diagnostics from this hospitalization (including imaging, microbiology, ancillary and laboratory) are listed below for reference.    Microbiology: Recent Results (from the past 240 hour(s))  Culture, blood (routine x 2)     Status: None   Collection Time: 09/13/15 11:00 AM  Result Value Ref Range Status   Specimen Description BLOOD CENTRAL LINE  Final   Special Requests BOTTLES DRAWN AEROBIC AND ANAEROBIC 5CC  Final   Culture NO GROWTH 5 DAYS  Final   Report Status 09/18/2015 FINAL  Final  MRSA PCR Screening     Status: None   Collection Time: 09/13/15 11:55 AM  Result Value Ref Range Status   MRSA by PCR NEGATIVE NEGATIVE Final    Comment:        The GeneXpert MRSA Assay (FDA approved for NASAL specimens only), is one component of a comprehensive MRSA colonization surveillance program. It is not intended to diagnose MRSA infection nor to guide or monitor treatment for MRSA infections.   Culture, Urine     Status: None   Collection Time: 09/13/15 11:57 AM  Result Value Ref Range Status   Specimen Description URINE, CATHETERIZED  Final   Special Requests Normal  Final   Culture NO GROWTH 1 DAY  Final   Report Status 09/14/2015 FINAL  Final  Culture, respiratory (NON-Expectorated)     Status: None   Collection Time: 09/13/15 12:03 PM  Result Value Ref Range Status   Specimen Description TRACHEAL ASPIRATE  Final   Special Requests NONE  Final   Gram Stain   Final    RARE WBC PRESENT, PREDOMINANTLY PMN RARE SQUAMOUS EPITHELIAL CELLS PRESENT FEW GRAM POSITIVE COCCI IN PAIRS RARE GRAM NEGATIVE RODS Performed at [facility]    Culture   Final    Non-Pathogenic Oropharyngeal-type [place] Isolated. Performed at [facility]    Report Status 09/16/2015 FINAL  Final  Culture, blood (routine x 2)     Status: None   Collection Time: 09/13/15 12:43 PM  Result Value Ref Range Status   Specimen Description BLOOD RIGHT HAND  Final    Special Requests BOTTLES DRAWN AEROBIC ONLY 5CC  Final   Culture NO GROWTH 5 DAYS  Final   Report Status 09/18/2015 FINAL  Final  C difficile quick scan w PCR reflex     Status: Abnormal   Collection Time: 09/19/15  6:49 AM  Result Value Ref Range Status   C Diff antigen POSITIVE (A) NEGATIVE Final   C Diff toxin NEGATIVE NEGATIVE Final   C Diff interpretation   Final    C. difficile present, but toxin not detected. This indicates colonization. In most cases, this does not require treatment. If patient has signs and symptoms consistent with colitis, consider treatment. Requires ENTERIC precautions.     Labs: Basic Metabolic Panel:  Recent Labs Lab 09/15/15 0513 09/16/15 0430 09/19/15 0458 09/20/15 0725  NA 135 136 137 141  K 3.8 3.7 3.8 3.8  CL 91* 96* 96* 101  CO2 26 25 23  20*  GLUCOSE 124* 113* 118* 107*  BUN 68* 37* 79* 107*  CREATININE 8.23* 4.49* 8.13* 10.05*  CALCIUM 8.2* 8.0* 8.2* 8.2*  PHOS 6.0* 3.6  --   --    Liver Function Tests:  Recent Labs Lab 09/15/15 0513 09/16/15 0430  ALBUMIN 2.7* 3.1*   CBC:  Recent Labs Lab 09/15/15 0513 09/16/15 0430 09/19/15 0458  WBC 6.1 10.2 6.7  HGB 7.6* 8.6* 9.0*  HCT 23.5* 27.3* 27.4*  MCV 96.7 99.6 98.6  PLT 73* 78* 66*   BNP: BNP (last 3 results)  Recent Labs  09/13/15 0657  BNP 1766.2*   CBG:  Recent Labs Lab 09/19/15 0406 09/19/15 0821 09/19/15 1544 09/20/15 0120 09/20/15 0648  GLUCAP 111* 105* 97 140* 103*     IMAGING STUDIES Ct Head Wo Contrast  09/13/2015   CLINICAL DATA:  Cardiac arrest with loss of consciousness.  EXAM: CT HEAD WITHOUT CONTRAST  TECHNIQUE: Contiguous axial images were obtained from the base of the skull through the vertex without intravenous contrast.  COMPARISON:  None.  FINDINGS: Right occipital approach ventricular shunt terminates in the posterior body of the right lateral ventricle. There cerebral ventricle sizes appear within normal limits, with no dilatation of  the temporal horns of the lateral ventricles.  No evidence of parenchymal hemorrhage or extra-axial fluid collection. Symmetric prominence of the extra-axial spaces along the frontal convexities represents increased CSF space due to cerebral atrophy (bridging veins are seen crossing the space). No mass lesion, mass effect, or midline shift. No CT evidence of acute infarction. There is prominent diffuse cerebral volume loss. There is atherosclerotic intracranial arterial calcification. Nonspecific prominent subcortical and periventricular white matter hypodensity, most in keeping with chronic small vessel ischemic change. Small hypodensities in the bilateral basal ganglia likely represent old lacunar infarcts.  Mucosal thickening and partial opacification of the maxillary sinuses and ethmoidal air cells bilaterally. The mastoid air cells are unopacified. No evidence of calvarial fracture.  IMPRESSION: 1. No acute intracranial abnormality. 2. Right occipital approach ventricular shunt terminates in the body of the right lateral ventricle. No ventriculomegaly. 3. Intracranial atherosclerosis, diffuse prominent cerebral volume loss, prominent chronic small vessel ischemic white matter change and old lacunar infarcts in the bilateral basal ganglia. 4. Partial opacification of the bilateral paranasal sinuses.   Electronically Signed   By: Ilona Sorrel M.D.   On: 09/13/2015 09:47   Ct Chest Wo Contrast  09/13/2015   CLINICAL DATA:  69 year old male with worsening shortness of breath. Cardiopulmonary arrest, CPR by EMS. Initial encounter. Dialysis, COPD, cardiac diastolic dysfunction.  EXAM: CT CHEST WITHOUT CONTRAST  TECHNIQUE: Multidetector CT imaging of the chest was performed following the standard protocol without IV contrast.  COMPARISON:  Portable chest radiographs 0702 hours today. Chest CT 01/09/2015.  FINDINGS: Intubated. Endotracheal tube tip above the carina. Enteric tube in place and courses to the mid  portion of the stomach.  Loculated right pleural effusion is slightly smaller than in January, moderate to large. Component along the right mediastinal contour has increased (series 21, image 13 today versus series 2, image 13 in January). Small layering left pleural effusion. No pericardial effusion.  No mediastinal hematoma. Extensive calcified atherosclerosis of the aorta. No mediastinal or hilar lymphadenopathy evident in the absence of contrast. No axillary lymphadenopathy.  Air bronchograms in the superior segment right lower lobe and inferior right upper lobe. Compressive right greater than left pulmonary atelectasis elsewhere. Small volume retained secretions in the mainstem bronchi. No pneumothorax.  No sternal fracture. No right rib fracture identified. No left rib fracture identified. Partially visible subcutaneous catheter tubing along the right anterior chest, the more medial tube appears to enter the peritoneal cavity as before. No acute osseous abnormality identified.  Stable visualized noncontrast upper abdominal viscera.  IMPRESSION: 1. Endotracheal tube tip above the carina. Enteric tube course to the mid stomach. 2. Chronic loculated right pleural effusion is mildly regressed since January. New small layering  left pleural effusion. No pericardial effusion. 3. Aspiration versus pneumonia in the superior segment right lower lobe. Compressive pulmonary atelectasis elsewhere. 4. No sternal or rib fractures identified.   Electronically Signed   By: Genevie Ann M.D.   On: 09/13/2015 09:40   Dg Chest Port 1 View  09/16/2015   CLINICAL DATA:  Healthcare associated pneumonia, respiratory failure, cardiac arrest, dialysis dependent renal failure.  EXAM: PORTABLE CHEST 1 VIEW  COMPARISON:  Portable chest x-ray of September 15, 2015  FINDINGS: The lung volumes remain low. Confluent alveolar opacity in the lower 1/2 of the right lung persists. The cardiac silhouette remains enlarged. The pulmonary vascularity is  engorged and indistinct.  The endotracheal tube tip lies 3 cm above the carina. The esophagogastric tube tip projects below the inferior margin of the image.  IMPRESSION: Persistent pneumonia in the mid and lower right lung at and in the left lower lung. Moderate-sized right pleural effusion. Persistent pulmonary vascular congestion. The support tubes are in reasonable position.   Electronically Signed   By: David  Martinique M.D.   On: 09/16/2015 07:19   Dg Chest Port 1 View  09/15/2015   CLINICAL DATA:  Healthcare associated pneumonia  EXAM: PORTABLE CHEST 1 VIEW  COMPARISON:  09/13/2015  FINDINGS: Endotracheal tube is 2 cm above the carina. Right jugular central venous catheter tip in the SVC unchanged. VP shunt tubing noted on the right. NG tube in place.  Bibasilar airspace disease right greater than left shows mild progression. This may be atelectasis or pneumonia. Right pleural effusion is present. Pulmonary vascular congestion  IMPRESSION: Progression of bibasilar atelectasis/infiltrate. Small moderate right effusion. Pulmonary vascular congestion.   Electronically Signed   By: Franchot Gallo M.D.   On: 09/15/2015 07:20   Dg Chest Port 1 View  09/13/2015   CLINICAL DATA:  Right-sided the central venous line placement.  EXAM: PORTABLE CHEST 1 VIEW  COMPARISON:  Chest x-ray and chest CT from earlier same day.  FINDINGS: Right-sided central line appears adequately positioned with tip in the superior vena cava. An adjacent ventriculoperitoneal shunt also noted, similar in location to previous studies. Endotracheal tube appears well positioned with tip approximately 2.5 cm above the carina. Enteric tube passes below the diaphragm. Cardiac pacer pads remain in place.  An additional catheter overlying the right lung is noted to be within the subcutaneous soft tissues of the right chest/abdominal wall on CT performed earlier today, likely residual from a previous VP shunt.  Cardiomegaly appears stable in the  short-term interval. No pneumothorax seen. Opacity at the right lung base is unchanged, compatible with the pleural effusion and atelectasis identified on earlier chest CT. Left lung remains relatively clear.  IMPRESSION: All tubes and lines appear well positioned. This includes the new right-sided internal jugular central line which appears adequately positioned with tip in the superior vena cava. No pneumothorax or other post-procedural complicating feature identified.  Stable opacity at the right lung base compatible with the combination of pleural effusion and atelectasis identified on earlier chest CT.  Cardiomegaly appears stable.   Electronically Signed   By: Franki Cabot M.D.   On: 09/13/2015 11:29   Dg Chest Portable 1 View  09/13/2015   CLINICAL DATA:  Intubated post CPR  EXAM: PORTABLE CHEST 1 VIEW  COMPARISON:  01/11/2015 chest radiograph  FINDINGS: Endotracheal tube tip is 1.8 cm above the carina. Enteric tube enters the stomach, with the tip not seen on this image. Pacer pad overlies the left lower  chest. Right VP shunt catheter courses inferiorly from the right neck into the cardiac silhouette, with the inferior portion of the VP shunt catheter not well visualized on this image. Stable cardiomediastinal silhouette with mild cardiomegaly. No pneumothorax. Small moderate right pleural effusion, increased. No left pleural effusion. There worsening linear and patchy parahilar opacities. There is increased medial right upper lung opacity and right basilar patchy opacity.  IMPRESSION: 1. Endotracheal tube tip is 1.8 cm above the carina. 2. Small moderate right pleural effusion, increased. 3. Stable mild cardiomegaly. Worsening linear and patchy parahilar opacities, and increased medial right upper lung and right basilar patchy opacity. Findings likely represent a combination of worsening atelectasis and pulmonary edema.   Electronically Signed   By: Ilona Sorrel M.D.   On: 09/13/2015 07:50   Dg  Swallowing Func-speech Pathology  09/18/2015    Objective Swallowing Evaluation:    Patient Details  Name: RASHAD AULD MRN: [id number] Date of Birth: [birth year]  Today's Date: 09/18/2015 Time: SLP Start Time (ACUTE ONLY): 1145-SLP Stop Time (ACUTE ONLY): 1200 SLP Time Calculation (min) (ACUTE ONLY): 15 min  Past Medical History:  Past Medical History  Diagnosis Date  . Anemia   . Diastolic dysfunction   . Retinitis pigmentosa     Blindness--has shunt --placed yrs ago in [place]..  . Arthritis     Gout  . Hypertension     dx--"long time"  . CKD (chronic kidney disease), stage IV ([place])     a. L upper extremity AV fistula created 07/2012.  [name] BPH (benign prostatic hyperplasia)   . Blind   . Gangrene of finger ([place])     a. 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  . Colon polyps   . Pericardial effusion     a. Noted on echo 10/2009. b. Again seen on echo 09/2013.  [name] CHF (congestive heart failure) ([place])   . COPD (chronic obstructive pulmonary disease) ([place])   . GI (gastrointestinal bleed) feb 2016  . Irregular heart rate 03/03/2015   Past Surgical History:  Past Surgical History  Procedure Laterality Date  . Cataract extraction w/ intraocular lens  implant, bilateral Bilateral   . Knee ligament reconstruction Left   . Av fistula placement  07/15/2012    Procedure: ARTERIOVENOUS (AV) FISTULA CREATION;  Surgeon: Rosetta Posner,  MD;  Location: [facility];  Service: Vascular;  Laterality: Left;  . Amputation  12/30/2012    Procedure: AMPUTATION DIGIT;  Surgeon: Tennis Must, MD;  Location:  [facility];  Service: Orthopedics;  Laterality: Left;  LEFT  SMALL FINGER AMPUTATION  . Pericardiocentesis  09/2013  . Pericardial tap N/A 09/19/2013    Procedure: PERICARDIAL TAP;  Surgeon: Blane Ohara, MD;  Location:  [facility] CATH LAB;  Service: Cardiovascular;  Laterality: N/A;   HPI:  Other Pertinent Information: 69 y/o with a PMH of smoking, remote  bilateral basal ganglia infarcts, retinitis pigmentosa with resultant   blindness, VP shunt, arthritis, HTN, CKD IV, BPH, pericardial effusion,  colon polyps, prior GIB in 01/2015 ), PAF, and COPD admitted after  suffering an in home cardiac arrest.Intubated 10/3-10/6. CXR persistent  pneumonia in the mid and lower right lung at and in the left lower lung.  Moderate-sized right pleural effusion. Persistent pulmonary vascular  congestion. BSE 09/22/13 recommended regular diet (ground meats), thin.    MBS indicated due to dysphagia symptoms.    No Data Recorded  Assessment / Plan / Recommendation CHL IP CLINICAL IMPRESSIONS 09/18/2015  Therapy Diagnosis Mild oral phase dysphagia;Mild pharyngeal phase  dysphagia;Moderate pharyngeal phase dysphagia;Mild cervical esophageal  phase dysphagia  Clinical Impression Pt with hyperextension of neck initially during MBS,  but corrected when consistencies presented with cues for neutral head  position/presentation of boluses; he exhibited decreased tongue base  retraction with mild vallecular residue with all consistencies, but  cleared with subsequent swallow which were independently initiated most of  the time, but verbal cues required intermittently during MBS for complete  vallecular clearance; mild oral residue noted as well, but cleared with  subsequent swallows; pt exhibited decreased airway closure with  nectar-thickened liquids via tsp/cup with resulting aspiration occurring  during the swallow; Honey-thickened liquids eliminated aspiration during  swallow with smaller amounts, but with larger volumes (large cup  sips/gulping), he penetrated into the laryngeal vestibule the residual  from pyriforms, he was able to clear valleculae/pyriform spaces with a dry  swallow after intake; C/P dysfunction cannot be r/o due to mild pyriform  residue with all consistencies, but cleared with subsequent swallows, so  alternating liquids and solids and smaller bites/sips recommended with  full supervision at meals for compensatory strategies.      CHL IP  TREATMENT RECOMMENDATION 09/18/2015  Treatment Recommendations Therapy as outlined in treatment plan below     CHL IP DIET RECOMMENDATION 09/18/2015  SLP Diet Recommendations Dysphagia 2 (Fine chop);Honey  Liquid Administration via (None)  Medication Administration Crushed with puree  Compensations Slow rate;Small sips/bites;Effortful swallow  Postural Changes and/or Swallow Maneuvers 90 degrees upright     CHL IP OTHER RECOMMENDATIONS 09/18/2015  Recommended Consults (None)  Oral Care Recommendations Oral care BID  Other Recommendations (None)     CHL IP FOLLOW UP RECOMMENDATIONS 09/23/2013  Follow up Recommendations (No Data)     CHL IP FREQUENCY AND DURATION 09/18/2015  Speech Therapy Frequency (ACUTE ONLY) min 2x/week  Treatment Duration 2 weeks     Pertinent Vitals/Pain     SLP Swallow Goals See POC     CHL IP REASON FOR REFERRAL 09/18/2015  Reason for Referral Objectively evaluate swallowing function     CHL IP ORAL PHASE 09/18/2015                    Oral Phase Impaired; see above                                                                            ADAMS,PAT, M.S., CCC-SLP 09/18/2015, 1:11 PM     DISCHARGE EXAMINATION: Filed Vitals:   09/20/15 2100 09/21/15 0447 09/21/15 0936 09/21/15 1025  BP: 116/43 126/38 121/39 122/41  Pulse: 63 54 99 60  Temp: 98.2 F (36.8 C) 98.2 F (36.8 C) 98 F (36.7 C)   TempSrc: Oral Oral Oral   Resp: 18 20 18    Height:      Weight:  58.5 kg (128 lb 15.5 oz)    SpO2: 100% 98% 100%    General appearance: alert, cooperative, appears stated age and no distress Resp: Improved air entry bilaterally. No rhonchi heard today. Few crackles at the bases. No wheezing. Cardio: regular rate and rhythm, S1, S2 normal, no murmur, click, rub or gallop GI: soft, non-tender; bowel sounds normal; no  masses,  no organomegaly Extremities: extremities normal, atraumatic, no cyanosis or edema   DISPOSITION: Home with home health  Discharge Instructions    Call MD for:   difficulty breathing, headache or visual disturbances    Complete by:  As directed      Call MD for:  extreme fatigue    Complete by:  As directed      Call MD for:  persistant dizziness or light-headedness    Complete by:  As directed      Call MD for:  persistant nausea and vomiting    Complete by:  As directed      Call MD for:  severe uncontrolled pain    Complete by:  As directed      Call MD for:  temperature >100.4    Complete by:  As directed      Discharge diet:    Complete by:  As directed   Dysphagia 2 diet     Discharge instructions    Complete by:  As directed   Please take your medications as prescribed. Please go for dialysis as before starting tomorrow, Oct 12.   You were cared for by a hospitalist during your hospital stay. If you have any questions about your discharge medications or the care you received while you were in the hospital after you are discharged, you can call the unit and asked to speak with the hospitalist on call if the hospitalist that took care of you is not available. Once you are discharged, your primary care physician will handle any further medical issues. Please note that NO REFILLS for any discharge medications will be authorized once you are discharged, as it is imperative that you return to your primary care physician (or establish a relationship with a primary care physician if you do not have one) for your aftercare needs so that they can reassess your need for medications and monitor your lab values. If you do not have a primary care physician, you can call [phone] for a physician referral.     Increase activity slowly    Complete by:  As directed            ALLERGIES:  Allergies  Allergen Reactions  . Ibuprofen Other (See Comments)    Kidney problems-dialysis patient  . Nsaids Other (See Comments)    Kidney problems-dialysis patient      Current Discharge Medication List    START taking these medications   Details    amoxicillin-clavulanate (AUGMENTIN) 500-125 MG tablet Take 1 tablet (500 mg total) by mouth daily. For 6 more days Qty: 6 tablet, Refills: 0    metroNIDAZOLE (FLAGYL) 500 MG tablet Take 1 tablet (500 mg total) by mouth every 8 (eight) hours. For 11 more days Qty: 33 tablet, Refills: 0    predniSONE (DELTASONE) 20 MG tablet Take 1 tablet (20 mg total) by mouth daily with breakfast. For 5 more days Qty: 5 tablet, Refills: 0    saccharomyces boulardii (FLORASTOR) 250 MG capsule Take 1 capsule (250 mg total) by mouth 2 (two) times daily. For 15 days Qty: 30 capsule, Refills: 0      CONTINUE these medications which have CHANGED   Details  carvedilol (COREG) 12.5 MG tablet Take 1 tablet (12.5 mg total) by mouth 2 (two) times daily with a meal. Qty: 60 tablet, Refills: 0      CONTINUE these medications which have NOT CHANGED   Details  acetaminophen (TYLENOL) 500 MG tablet Take 1,000  mg by mouth every 6 (six) hours as needed for moderate pain.    albuterol (PROVENTIL HFA;VENTOLIN HFA) 108 (90 BASE) MCG/ACT inhaler Inhale 1-2 puffs into the lungs every 6 (six) hours as needed for wheezing or shortness of breath.    allopurinol (ZYLOPRIM) 100 MG tablet Take 100 mg by mouth daily.     aspirin 81 MG tablet Take 81 mg by mouth daily.    diltiazem (CARDIZEM) 60 MG tablet Take 60 mg by mouth 2 (two) times daily. Refills: 0    diphenhydrAMINE (BENADRYL) 25 MG tablet Take 25 mg by mouth every 6 (six) hours as needed for itching or allergies (congestion).     doxercalciferol (HECTOROL) 4 MCG/2ML injection Inject 0.5 mLs (1 mcg total) into the vein every Monday, Wednesday, and Friday with hemodialysis. Qty: 2 mL    ferric gluconate 125 mg in sodium chloride 0.9 % 100 mL Inject 125 mg into the vein every Monday, Wednesday, and Friday with hemodialysis.    guaiFENesin (MUCINEX) 600 MG 12 hr tablet Take 1 tablet (600 mg total) by mouth 2 (two) times daily as needed (congestion).    Multiple  Vitamin (MULTIVITAMIN WITH MINERALS) TABS tablet Take 1 tablet by mouth daily.    SPIRIVA RESPIMAT 2.5 MCG/ACT AERS INHALE 2 PUFFS INTO THE LUNGS EVERY MORNING Qty: 4 Inhaler, Refills: 0       Follow-up Information    Follow up with [name], MD On 09/28/2015.   Specialty:  Family Medicine   Why:  Hospital F/U @ 3:45pm   Contact information:   [address] [place] [address] [phone]       Follow up with [facility].   Why:  They will do your home health care at your home   Contact information:   [address] [place] [address] [phone]       TOTAL DISCHARGE TIME: 35 minutes  [facility] Pager [phone]  09/21/2015, 1:52 PM

## 2015-09-21 NOTE — Progress Notes (Signed)
Subjective:    No new events Refused SNF placement HD yesterday, 0.3L UF, post weight 59.4kg w/ normal BP, 4K bath  Objective Filed Vitals:   09/20/15 1216 09/20/15 1238 09/20/15 2100 09/21/15 0447  BP: 143/47 156/51 116/43 126/38  Pulse: 65 66 63 54  Temp: 98.1 F (36.7 C)  98.2 F (36.8 C) 98.2 F (36.8 C)  TempSrc: Oral  Oral Oral  Resp: 18  18 20   Height:      Weight:    58.5 kg (128 lb 15.5 oz)  SpO2: 97% 98% 100% 98%   Physical Exam General:  Alert and oriented, no acute distress Heart: RRR  Lungs: CTA, unlabored  Abdomen: soft, nontender +BS  Extremities: no edema  Dialysis Access:  L AVf +b/t   [facility] 4h 65.5kg 2/2.25 bath Heparin none LUA AVF Hect 7 ug Venofer 50/ wk Mircera 200 every 2 wks  Assessment: 1. Cardiac arrest (asystole)- arrested at home and resuscitated back to baseline 2. HCAP- s/p vanc and zosyn/ afebrile 2. ESRD - MWF [place]- next HD Wed at [facility] pending DC today.  3. Anemia - hgb trednign up, on ESA 4. Secondary hyperparathyroidism - phos 3.6. Cont hectorol. No binders 5. HTN/volume - coreg, cardizem and prn hydralazine. Under dry wt 6. Nutrition -  NPO per swallow eval- alb 3.1 7. DM 8. AMS - better, prob close to baseline 9. Diarrhea - +Cdif antigen only, on po flagyl since is having diarrhea 10. Dispo - home today, declined SNF  Pearson Grippe MD [facility] 09/21/2015, 9:32 AM   Additional Objective Labs: Basic Metabolic Panel:  Recent Labs Lab 09/15/15 0513 09/16/15 0430 09/19/15 0458 09/20/15 0725  NA 135 136 137 141  K 3.8 3.7 3.8 3.8  CL 91* 96* 96* 101  CO2 26 25 23  20*  GLUCOSE 124* 113* 118* 107*  BUN 68* 37* 79* 107*  CREATININE 8.23* 4.49* 8.13* 10.05*  CALCIUM 8.2* 8.0* 8.2* 8.2*  PHOS 6.0* 3.6  --   --    Liver Function Tests:  Recent Labs Lab 09/15/15 0513 09/16/15 0430  ALBUMIN 2.7* 3.1*   No results for input(s): LIPASE, AMYLASE in the last 168 hours. CBC:  Recent  Labs Lab 09/15/15 0513 09/16/15 0430 09/19/15 0458  WBC 6.1 10.2 6.7  HGB 7.6* 8.6* 9.0*  HCT 23.5* 27.3* 27.4*  MCV 96.7 99.6 98.6  PLT 73* 78* 66*   Blood Culture    Component Value Date/Time   SDES BLOOD RIGHT HAND 09/13/2015 1243   SPECREQUEST BOTTLES DRAWN AEROBIC ONLY 5CC 09/13/2015 1243   CULT NO GROWTH 5 DAYS 09/13/2015 1243   REPTSTATUS 09/18/2015 FINAL 09/13/2015 1243    Cardiac Enzymes: No results for input(s): CKTOTAL, CKMB, CKMBINDEX, TROPONINI in the last 168 hours. CBG:  Recent Labs Lab 09/19/15 0406 09/19/15 0821 09/19/15 1544 09/20/15 0120 09/20/15 0648  GLUCAP 111* 105* 97 140* 103*   Iron Studies: No results for input(s): IRON, TIBC, TRANSFERRIN, FERRITIN in the last 72 hours. @lablastinr3 @ Studies/Results: No results found. Medications:   . amoxicillin-clavulanate  1 tablet Oral Q24H  . antiseptic oral rinse  7 mL Mouth Rinse BID  . aspirin  81 mg Oral Daily  . carvedilol  12.5 mg Oral BID WC  . diltiazem  60 mg Oral Q12H  . doxercalciferol  7 mcg Intravenous Q M,W,F-HD  . ferric gluconate (FERRLECIT/NULECIT) IV  62.5 mg Intravenous Q Wed-HD  . metroNIDAZOLE  500 mg Oral 3 times per day  .  pantoprazole sodium  40 mg Per Tube Q24H  . predniSONE  20 mg Oral Q breakfast  . saccharomyces boulardii  250 mg Oral BID

## 2015-09-21 NOTE — Evaluation (Signed)
Occupational Therapy Evaluation Patient Details Name: Cameron Mendez MRN: [id number] DOB: 1946/07/03 Today's Date: 09/21/2015    History of Present Illness Pt fell while walking at home secondary to cardiac arrest.  He was intubated 10/3, extubated 10/6.   Clinical Impression   Patient presenting with deconditioning and decreased overall independence secondary to above. Patient independent PTA. Patient currently functioning at an overall min to mod assist level. Patient will benefit from acute OT to increase overall independence in the areas of ADLs, functional mobility, and overall safety in order to safely discharge SNF vs home. IF pt refuses SNF, recommending HHOT and 24/7 supervision/assistance.     Follow Up Recommendations  SNF;Supervision/Assistance - 24 hour    Equipment Recommendations  Other (comment) (TBD)    Recommendations for Other Services  None at this time   Precautions / Restrictions Precautions Precautions: Fall;Other (comment) Precaution Comments: pt blind Restrictions Weight Bearing Restrictions: No    Mobility Bed Mobility Overal bed mobility: Needs Assistance Bed Mobility: Supine to Sit     Supine to sit: Mod assist     General bed mobility comments: Mod assist for trunk control/support to come to a sitting position  Transfers Overall transfer level: Needs assistance Equipment used: Rolling walker (2 wheeled) Transfers: Sit to/from Stand Sit to Stand: Min assist General transfer comment: verbal and tactile cues due to vision impairment    Balance Overall balance assessment: Needs assistance Sitting-balance support: No upper extremity supported;Feet supported Sitting balance-Leahy Scale: Good     Standing balance support: Bilateral upper extremity supported;During functional activity Standing balance-Leahy Scale: Poor    ADL Overall ADL's : Needs assistance/impaired Eating/Feeding: Set up;Sitting   Grooming: Min guard;Standing   Upper  Body Bathing: Set up;Sitting   Lower Body Bathing: Minimal assistance;Sit to/from stand   Upper Body Dressing : Set up;Sitting   Lower Body Dressing: Minimal assistance;Sit to/from stand   Toilet Transfer: RW;Minimal assistance Functional mobility during ADLs: Minimal assistance;Rolling walker General ADL Comments: Pt blind, 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 Pt required mod assist for bed mobility and is overall min assist with ADLs and functional mobility using RW. Pt repprts his wife works daily, and his sister checks on him occassionaly.     Pertinent Vitals/Pain Pain Assessment: No/denies pain     Hand Dominance Right   Extremity/Trunk Assessment Upper Extremity Assessment Upper Extremity Assessment: Overall WFL for tasks assessed   Lower Extremity Assessment Lower Extremity Assessment: Defer to PT evaluation   Cervical / Trunk Assessment Cervical / Trunk Assessment: Normal   Communication Communication Communication: No difficulties (slow speech)   Cognition Arousal/Alertness: Awake/alert Behavior During Therapy: WFL for tasks assessed/performed Overall Cognitive Status: Within Functional Limits for tasks assessed              Home Living Family/patient expects to be discharged to:: Private residence Living Arrangements: Spouse/significant other Available Help at Discharge: Family;Available PRN/intermittently (wife works during the day, pt reports his sister can come check on him occassionally) Type of Home: House Home Access: Stairs to enter [employer] of Steps: 2 (front door) Entrance Stairs-Rails: None Home Layout: One level     Bathroom Shower/Tub: Tub/shower unit;Curtain   Biochemist, clinical: Standard     Home Equipment: None   Additional Comments: HD M, W, F.  Able to shower self in standing, occasional assist from wife      Prior Functioning/Environment Level of Independence: Independent   Comments: wife does meal prep due to  pt's vision deficits    OT Diagnosis: Generalized weakness   OT Problem List: Decreased strength;Decreased activity tolerance;Impaired balance (sitting and/or standing);Decreased safety awareness;Decreased knowledge of use of DME or AE   OT Treatment/Interventions: Self-care/ADL training;Therapeutic exercise;Energy conservation;DME and/or AE instruction;Therapeutic activities;Patient/family education;Balance training    OT Goals(Current goals can be found in the care plan section) Acute Rehab OT Goals Patient Stated Goal: get stronger and go home OT Goal Formulation: With patient Time For Goal Achievement: 10/05/15 Potential to Achieve Goals: Good ADL Goals Pt Will Perform Grooming: with supervision;standing Pt Will Perform Lower Body Bathing: with supervision;sit to/from stand Pt Will Perform Lower Body Dressing: with supervision;sit to/from stand Pt Will Transfer to Toilet: with supervision;ambulating;bedside commode Pt Will Perform Tub/Shower Transfer: Tub transfer;ambulating;rolling walker;with supervision  OT Frequency: Min 2X/week   Barriers to D/C: Decreased caregiver support   End of Session Equipment Utilized During Treatment: Gait belt;Rolling walker  Activity Tolerance: Patient tolerated treatment well Patient left: in chair;with call bell/phone within reach;with chair alarm set   Time: 1791-5056 OT Time Calculation (min): 23 min Charges:  OT General Charges $OT Visit: 1 Procedure OT Evaluation $Initial OT Evaluation Tier I: 1 Procedure OT Treatments $Self Care/Home Management : 8-22 mins  Jamicheal Heard , MS, OTR/L, CLT Pager: [phone]  09/21/2015, 1:33 PM

## 2015-09-21 NOTE — Progress Notes (Signed)
Referral made to [facility] as requested for [facility] services. Mindi Slicker [facility] [phone]

## 2015-09-21 NOTE — Progress Notes (Signed)
Orders received for pt discharge.  Discharge summary printed and reviewed with pt.  Explained medication regimen, and pt had no further questions at this time. Right IJ was removed by IV team and remains clean, dry, and intact. Telemetry removed.  Pt in stable condition and awaiting transport.

## 2015-09-21 NOTE — Progress Notes (Signed)
Physical Therapy Treatment Patient Details Name: Cameron Mendez MRN: [id number] DOB: 17-Oct-1946 Today's Date: 09/21/2015    History of Present Illness Pt fell while walking at home secondary to cardiac arrest.  He was intubated 10/3, extubated 10/6.    PT Comments    Pt con't to require 24/7 assist for safe d/c home. Pt guarded with ambulation and requiring minA for safe ambulation with RW due to pt's vision deficits. Suspect pt would progress better at home since he is familiar with his environment however pt unsafe to be home alone. Pt reports spouse and sister will be there to assist him as needed.   Follow Up Recommendations  Home health PT;Supervision/Assistance - 24 hour     Equipment Recommendations  Rolling walker with 5" wheels    Recommendations for Other Services       Precautions / Restrictions Precautions Precautions: Fall;Other (comment) Precaution Comments: pt blind Restrictions Weight Bearing Restrictions: No    Mobility  Bed Mobility Overal bed mobility: Needs Assistance Bed Mobility: Supine to Sit     Supine to sit: Min assist     General bed mobility comments: minA to guide hands to grab bars, and directional tactile cues due to impaired vision  Transfers Overall transfer level: Needs assistance Equipment used: Rolling walker (2 wheeled) Transfers: Sit to/from Stand Sit to Stand: Min assist         General transfer comment: verbal and tactile cues due to vision impairment  Ambulation/Gait Ambulation/Gait assistance: Min assist Ambulation Distance (Feet): 60 Feet Assistive device: Rolling walker (2 wheeled) Gait Pattern/deviations: Step-to pattern;Decreased stride length Gait velocity: guarded due to unfamilar with surroundings Gait velocity interpretation: <1.8 ft/sec, indicative of risk for recurrent falls General Gait Details: min assist to guide RW secondary to vision difficulties   Stairs            Wheelchair Mobility     Modified Rankin (Stroke Patients Only)       Balance Overall balance assessment: Needs assistance Sitting-balance support: Feet supported;No upper extremity supported Sitting balance-Leahy Scale: Good     Standing balance support: Bilateral upper extremity supported Standing balance-Leahy Scale: Poor                      Cognition Arousal/Alertness: Awake/alert Behavior During Therapy: WFL for tasks assessed/performed Overall Cognitive Status: Within Functional Limits for tasks assessed                      Exercises      General Comments General comments (skin integrity, edema, etc.): pt with heel sores      Pertinent Vitals/Pain Pain Assessment: No/denies pain    Home Living                      Prior Function            PT Goals (current goals can now be found in the care plan section) Acute Rehab PT Goals Patient Stated Goal: get stronger Progress towards PT goals: Progressing toward goals    Frequency  Min 3X/week    PT Plan Current plan remains appropriate    Co-evaluation             End of Session Equipment Utilized During Treatment: Gait belt Activity Tolerance: Patient tolerated treatment well Patient left: in chair;with call bell/phone within reach (MD present)     Time: 1540-0867 PT Time Calculation (min) (ACUTE ONLY): 26 min  Charges:  $  Gait Training: 8-22 mins $Therapeutic Activity: 8-22 mins                    G Codes:      Kingsley Callander 09/21/2015, 10:56 AM   Kittie Plater, PT, DPT Pager #: [phone] Office #: [phone]

## 2015-09-28 ENCOUNTER — Encounter (HOSPITAL_COMMUNITY): Payer: Self-pay | Admitting: Vascular Surgery

## 2015-09-28 ENCOUNTER — Inpatient Hospital Stay (HOSPITAL_COMMUNITY)
Admission: EM | Admit: 2015-09-28 | Discharge: 2015-10-01 | DRG: 308 | Disposition: A | Discharge status: Home / Self Care | Payer: Medicare Other | Attending: Cardiovascular Disease | Admitting: Cardiovascular Disease

## 2015-09-28 ENCOUNTER — Emergency Department (HOSPITAL_COMMUNITY): Payer: Medicare Other

## 2015-09-28 DIAGNOSIS — N2581 Secondary hyperparathyroidism of renal origin: Secondary | ICD-10-CM | POA: Diagnosis present

## 2015-09-28 DIAGNOSIS — D696 Thrombocytopenia, unspecified: Secondary | ICD-10-CM | POA: Diagnosis present

## 2015-09-28 DIAGNOSIS — Z86010 Personal history of colonic polyps: Secondary | ICD-10-CM

## 2015-09-28 DIAGNOSIS — Z66 Do not resuscitate: Secondary | ICD-10-CM | POA: Diagnosis present

## 2015-09-28 DIAGNOSIS — Z8701 Personal history of pneumonia (recurrent): Secondary | ICD-10-CM

## 2015-09-28 DIAGNOSIS — J449 Chronic obstructive pulmonary disease, unspecified: Secondary | ICD-10-CM | POA: Diagnosis present

## 2015-09-28 DIAGNOSIS — Z89022 Acquired absence of left finger(s): Secondary | ICD-10-CM | POA: Diagnosis not present

## 2015-09-28 DIAGNOSIS — I4891 Unspecified atrial fibrillation: Secondary | ICD-10-CM

## 2015-09-28 DIAGNOSIS — I471 Supraventricular tachycardia: Secondary | ICD-10-CM | POA: Diagnosis present

## 2015-09-28 DIAGNOSIS — Z8674 Personal history of sudden cardiac arrest: Secondary | ICD-10-CM | POA: Diagnosis not present

## 2015-09-28 DIAGNOSIS — I5032 Chronic diastolic (congestive) heart failure: Secondary | ICD-10-CM | POA: Diagnosis present

## 2015-09-28 DIAGNOSIS — M109 Gout, unspecified: Secondary | ICD-10-CM | POA: Diagnosis present

## 2015-09-28 DIAGNOSIS — N186 End stage renal disease: Secondary | ICD-10-CM | POA: Diagnosis present

## 2015-09-28 DIAGNOSIS — D631 Anemia in chronic kidney disease: Secondary | ICD-10-CM | POA: Diagnosis present

## 2015-09-28 DIAGNOSIS — F17210 Nicotine dependence, cigarettes, uncomplicated: Secondary | ICD-10-CM | POA: Diagnosis present

## 2015-09-28 DIAGNOSIS — I132 Hypertensive heart and chronic kidney disease with heart failure and with stage 5 chronic kidney disease, or end stage renal disease: Secondary | ICD-10-CM | POA: Diagnosis present

## 2015-09-28 DIAGNOSIS — I480 Paroxysmal atrial fibrillation: Secondary | ICD-10-CM | POA: Diagnosis present

## 2015-09-28 DIAGNOSIS — N400 Enlarged prostate without lower urinary tract symptoms: Secondary | ICD-10-CM | POA: Diagnosis present

## 2015-09-28 DIAGNOSIS — H548 Legal blindness, as defined in USA: Secondary | ICD-10-CM | POA: Diagnosis present

## 2015-09-28 DIAGNOSIS — E1122 Type 2 diabetes mellitus with diabetic chronic kidney disease: Secondary | ICD-10-CM | POA: Diagnosis present

## 2015-09-28 DIAGNOSIS — I248 Other forms of acute ischemic heart disease: Secondary | ICD-10-CM | POA: Diagnosis present

## 2015-09-28 DIAGNOSIS — I739 Peripheral vascular disease, unspecified: Secondary | ICD-10-CM | POA: Diagnosis present

## 2015-09-28 DIAGNOSIS — Z992 Dependence on renal dialysis: Secondary | ICD-10-CM | POA: Diagnosis not present

## 2015-09-28 DIAGNOSIS — H3552 Pigmentary retinal dystrophy: Secondary | ICD-10-CM | POA: Diagnosis present

## 2015-09-28 DIAGNOSIS — Z221 Carrier of other intestinal infectious diseases: Secondary | ICD-10-CM

## 2015-09-28 DIAGNOSIS — Z886 Allergy status to analgesic agent status: Secondary | ICD-10-CM | POA: Diagnosis not present

## 2015-09-28 DIAGNOSIS — I249 Acute ischemic heart disease, unspecified: Secondary | ICD-10-CM | POA: Diagnosis present

## 2015-09-28 HISTORY — DX: Cardiac arrest, cause unspecified: I46.9

## 2015-09-28 LAB — CBC WITH DIFFERENTIAL/PLATELET
BASOS ABS: 0 10*3/uL (ref 0.0–0.1)
Basophils Relative: 0 %
EOS ABS: 0 10*3/uL (ref 0.0–0.7)
Eosinophils Relative: 0 %
HCT: 32.5 % — ABNORMAL LOW (ref 39.0–52.0)
Hemoglobin: 10.1 g/dL — ABNORMAL LOW (ref 13.0–17.0)
LYMPHS ABS: 0.8 10*3/uL (ref 0.7–4.0)
Lymphocytes Relative: 13 %
MCH: 34.4 pg — ABNORMAL HIGH (ref 26.0–34.0)
MCHC: 31.1 g/dL (ref 30.0–36.0)
MCV: 110.5 fL — ABNORMAL HIGH (ref 78.0–100.0)
MONO ABS: 0.4 10*3/uL (ref 0.1–1.0)
Monocytes Relative: 7 %
Neutro Abs: 5.1 10*3/uL (ref 1.7–7.7)
Neutrophils Relative %: 80 %
Platelets: 55 10*3/uL — ABNORMAL LOW (ref 150–400)
RBC: 2.94 MIL/uL — AB (ref 4.22–5.81)
RDW: 24.5 % — AB (ref 11.5–15.5)
WBC: 6.3 10*3/uL (ref 4.0–10.5)

## 2015-09-28 LAB — PHOSPHORUS: PHOSPHORUS: 6.2 mg/dL — AB (ref 2.5–4.6)

## 2015-09-28 LAB — COMPREHENSIVE METABOLIC PANEL
ALBUMIN: 3.1 g/dL — AB (ref 3.5–5.0)
ALT: 13 U/L — ABNORMAL LOW (ref 17–63)
ANION GAP: 14 (ref 5–15)
AST: 21 U/L (ref 15–41)
Alkaline Phosphatase: 55 U/L (ref 38–126)
BILIRUBIN TOTAL: 0.8 mg/dL (ref 0.3–1.2)
BUN: 37 mg/dL — ABNORMAL HIGH (ref 6–20)
CO2: 28 mmol/L (ref 22–32)
Calcium: 8 mg/dL — ABNORMAL LOW (ref 8.9–10.3)
Chloride: 96 mmol/L — ABNORMAL LOW (ref 101–111)
Creatinine, Ser: 8.11 mg/dL — ABNORMAL HIGH (ref 0.61–1.24)
GFR calc Af Amer: 7 mL/min — ABNORMAL LOW (ref >60)
GFR calc non Af Amer: 6 mL/min — ABNORMAL LOW (ref >60)
GLUCOSE: 87 mg/dL (ref 65–99)
POTASSIUM: 3.8 mmol/L (ref 3.5–5.1)
SODIUM: 138 mmol/L (ref 135–145)
TOTAL PROTEIN: 6.7 g/dL (ref 6.5–8.1)

## 2015-09-28 LAB — BRAIN NATRIURETIC PEPTIDE: B Natriuretic Peptide: 764.8 pg/mL — ABNORMAL HIGH (ref 0.0–100.0)

## 2015-09-28 LAB — I-STAT TROPONIN, ED: TROPONIN I, POC: 0.02 ng/mL (ref 0.00–0.08)

## 2015-09-28 LAB — MAGNESIUM: Magnesium: 1.9 mg/dL (ref 1.7–2.4)

## 2015-09-28 MED ORDER — DILTIAZEM HCL 100 MG IV SOLR
5.0000 mg/h | Freq: Once | INTRAVENOUS | Status: AC
  Administered 2015-09-28: 5 mg/h via INTRAVENOUS
  Filled 2015-09-28: qty 100

## 2015-09-28 MED ORDER — ALBUTEROL SULFATE HFA 108 (90 BASE) MCG/ACT IN AERS: 1.0000 | INHALATION_SPRAY | Freq: Four times a day (QID) | RESPIRATORY_TRACT | Status: DC | PRN

## 2015-09-28 MED ORDER — METOPROLOL TARTRATE 1 MG/ML IV SOLN
5.0000 mg | Freq: Once | INTRAVENOUS | Status: AC
  Administered 2015-09-28: 5 mg via INTRAVENOUS
  Filled 2015-09-28: qty 5

## 2015-09-28 MED ORDER — ALBUTEROL SULFATE (2.5 MG/3ML) 0.083% IN NEBU: 2.5000 mg | INHALATION_SOLUTION | Freq: Four times a day (QID) | RESPIRATORY_TRACT | Status: DC | PRN

## 2015-09-28 MED ORDER — ADULT MULTIVITAMIN W/MINERALS CH
1.0000 | ORAL_TABLET | Freq: Every day | ORAL | Status: DC
  Administered 2015-09-29 – 2015-10-01 (×3): 1 via ORAL
  Filled 2015-09-28 (×2): qty 1

## 2015-09-28 MED ORDER — ASPIRIN EC 81 MG PO TBEC
81.0000 mg | DELAYED_RELEASE_TABLET | Freq: Every day | ORAL | Status: DC
  Administered 2015-09-29 – 2015-10-01 (×3): 81 mg via ORAL
  Filled 2015-09-28 (×3): qty 1

## 2015-09-28 MED ORDER — ACETAMINOPHEN 325 MG PO TABS: 650.0000 mg | ORAL_TABLET | ORAL | Status: DC | PRN

## 2015-09-28 MED ORDER — SACCHAROMYCES BOULARDII 250 MG PO CAPS
250.0000 mg | ORAL_CAPSULE | Freq: Two times a day (BID) | ORAL | Status: DC
  Administered 2015-09-28 – 2015-10-01 (×6): 250 mg via ORAL
  Filled 2015-09-28 (×6): qty 1

## 2015-09-28 MED ORDER — NITROGLYCERIN 0.4 MG SL SUBL: 0.4000 mg | SUBLINGUAL_TABLET | SUBLINGUAL | Status: DC | PRN

## 2015-09-28 MED ORDER — DIPHENHYDRAMINE HCL 25 MG PO TABS
25.0000 mg | ORAL_TABLET | Freq: Four times a day (QID) | ORAL | Status: DC | PRN
  Filled 2015-09-28: qty 1

## 2015-09-28 MED ORDER — ALLOPURINOL 100 MG PO TABS
100.0000 mg | ORAL_TABLET | Freq: Every day | ORAL | Status: DC
  Administered 2015-09-29 – 2015-10-01 (×3): 100 mg via ORAL
  Filled 2015-09-28 (×3): qty 1

## 2015-09-28 MED ORDER — SODIUM CHLORIDE 0.9 % IV SOLN
125.0000 mg | INTRAVENOUS | Status: DC
  Administered 2015-10-01: 125 mg via INTRAVENOUS
  Filled 2015-09-28 (×3): qty 10

## 2015-09-28 MED ORDER — ONDANSETRON HCL 4 MG/2ML IJ SOLN: 4.0000 mg | Freq: Four times a day (QID) | INTRAMUSCULAR | Status: DC | PRN

## 2015-09-28 MED ORDER — METRONIDAZOLE 500 MG PO TABS
500.0000 mg | ORAL_TABLET | Freq: Three times a day (TID) | ORAL | Status: DC
  Administered 2015-09-28 – 2015-09-29 (×2): 500 mg via ORAL
  Filled 2015-09-28 (×2): qty 1

## 2015-09-28 MED ORDER — CARVEDILOL 12.5 MG PO TABS
12.5000 mg | ORAL_TABLET | Freq: Two times a day (BID) | ORAL | Status: DC
  Administered 2015-09-29 – 2015-09-30 (×3): 12.5 mg via ORAL
  Filled 2015-09-28 (×3): qty 1

## 2015-09-28 MED ORDER — DOXERCALCIFEROL 4 MCG/2ML IV SOLN
1.0000 ug | INTRAVENOUS | Status: DC
  Administered 2015-10-01: 1 ug via INTRAVENOUS
  Filled 2015-09-28 (×2): qty 2

## 2015-09-28 NOTE — ED Notes (Signed)
Pt reports to the ED for eval of atrial fibrillation with RVR. Pt went to PCP for a follow up appt and he began to have CP so the PCP called 911. 12 lead on arrival showed atrial fibrillation with RVR. Rate from 100s-160s. Was recently admitted for a cardiac arrest x 2 weeks ago. Is on Cardizem and has been taking it as prescribed. Pt denies any CP or SOB at this time. Last dialysis yesterday. Had full tx. Pt A&Ox4, resp e/u, and skin warm and dry

## 2015-09-28 NOTE — Progress Notes (Signed)
Pharmacy Education Note  Per pharmacy technician, the pt's spouse had not given the patient his Cardizem in several days due to some new antibiotics and a large pill burden. I spoke with the pt's spouse at length about this medication and how important it is for the patient to take on a daily basis to keep his heart in rhythm. I recommended that she speak with her husband's physician or pharmacist in the future if she was planning to hold or discontinue any medications.   She verbalized understanding and I answered some additional questions that she had regarding his Coreg.  Please re-consult pharmacy if any additional education is needed. Thank you for allowing [place] to participate in this patient's care.  Governor Specking, PharmD Clinical Pharmacy Resident Pager: [phone]

## 2015-09-28 NOTE — ED Notes (Signed)
Attempted report 

## 2015-09-28 NOTE — ED Provider Notes (Addendum)
CSN: [id number]     Arrival date & time 09/28/15  1759 History   First MD Initiated Contact with Patient 09/28/15 1804     Chief Complaint  Patient presents with  . Atrial Fibrillation     (Consider location/radiation/quality/duration/timing/severity/associated sxs/prior Treatment) HPI  69 year old male with history of end-stage renal disease on hemodialysis, paroxysmal atrial fibrillation not on anticoagulation, and COPD. He presents with atrial fibrillation with RVR. Was recently discharged from the hospital 8 days ago after prolonged hospitalization for a cardiac arrest, felt to be due to respiratory failure in the setting of fluid overload and hospital-acquired pneumonia. Wife reports that he continues to be weak at home, although slowly improving. He has been eating and drinking with good appetite. Went to dialysis yesterday with full treatment. Went for routine follow-up with his primary care provider today, complained of mild chest pain. Was noted to be in atrial fibrillation with RVR and subsequently sent to our ED for evaluation. On arrival, he says that his pain had now fully resolved. He did not have any associating shortness of breath, lightheadedness, syncope, near syncope, diaphoresis, nausea, vomiting, or palpitations. Had diarrhea in the hospital, which has significantly improved since he has been taking Flagyl. Denies cough, congestion, fevers, or chills.  Past Medical History  Diagnosis Date  . Anemia   . Diastolic dysfunction   . Retinitis pigmentosa     Blindness--has shunt --placed yrs ago in [place]..  . Arthritis     Gout  . Hypertension     dx--"long time"  . CKD (chronic kidney disease), stage IV ([place])     a. L upper extremity AV fistula created 07/2012.  [name] BPH (benign prostatic hyperplasia)   . Blind   . Gangrene of finger ([place])     a. 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  . Colon polyps   . Pericardial effusion     a. Noted on echo 10/2009. b. Again  seen on echo 09/2013.  [name] CHF (congestive heart failure) ([place])   . COPD (chronic obstructive pulmonary disease) ([place])   . GI (gastrointestinal bleed) feb 2016  . Irregular heart rate 03/03/2015  . Cardiac arrest [facility])    Past Surgical History  Procedure Laterality Date  . Cataract extraction w/ intraocular lens  implant, bilateral Bilateral   . Knee ligament reconstruction Left   . Av fistula placement  07/15/2012    Procedure: ARTERIOVENOUS (AV) FISTULA CREATION;  Surgeon: Rosetta Posner, MD;  Location: [place];  Service: Vascular;  Laterality: Left;  . Amputation  12/30/2012    Procedure: AMPUTATION DIGIT;  Surgeon: Tennis Must, MD;  Location: [place];  Service: Orthopedics;  Laterality: Left;  LEFT SMALL FINGER AMPUTATION  . Pericardiocentesis  09/2013  . Pericardial tap N/A 09/19/2013    Procedure: PERICARDIAL TAP;  Surgeon: Blane Ohara, MD;  Location: [facility] CATH LAB;  Service: Cardiovascular;  Laterality: N/A;   Family History  Problem Relation Age of Onset  . Ovarian cancer Mother   . Kidney disease Neg Hx   . Gallbladder disease Neg Hx   . Esophageal cancer Neg Hx   . Heart disease Neg Hx   . Stomach cancer Neg Hx   . Colon polyps Brother   . Colon cancer Brother    Social History  Substance Use Topics  . Smoking status: Current Every Day Smoker -- 0.50 packs/day for 50 years    Types: Cigarettes  . Smokeless tobacco: Never Used  Comment: Pt given handout on how to quit smoking 12/31/14  . Alcohol Use: No     Comment: "quit driinking in ~ 2000"    Review of Systems 10/14 systems reviewed and are negative other than those stated in the HPI    Allergies  Ibuprofen and Nsaids  Home Medications   Prior to Admission medications   Medication Sig Start Date End Date Taking? Authorizing Provider  acetaminophen (TYLENOL) 500 MG tablet Take 1,000 mg by mouth every 6 (six) hours as needed for moderate pain.   Yes Historical Provider, MD  allopurinol  (ZYLOPRIM) 100 MG tablet Take 100 mg by mouth daily.    Yes Historical Provider, MD  aspirin 81 MG tablet Take 81 mg by mouth daily.   Yes Historical Provider, MD  carvedilol (COREG) 12.5 MG tablet Take 1 tablet (12.5 mg total) by mouth 2 (two) times daily with a meal. 09/21/15  Yes Bonnielee Haff, MD  diltiazem (CARDIZEM) 60 MG tablet Take 60 mg by mouth 2 (two) times daily. 08/20/15  Yes Historical Provider, MD  metroNIDAZOLE (FLAGYL) 500 MG tablet Take 1 tablet (500 mg total) by mouth every 8 (eight) hours. For 11 more days 09/21/15  Yes Bonnielee Haff, MD  Multiple Vitamin (MULTIVITAMIN WITH MINERALS) TABS tablet Take 1 tablet by mouth daily.   Yes Historical Provider, MD  saccharomyces boulardii (FLORASTOR) 250 MG capsule Take 1 capsule (250 mg total) by mouth 2 (two) times daily. For 15 days 09/21/15  Yes Bonnielee Haff, MD  SPIRIVA RESPIMAT 2.5 MCG/ACT AERS INHALE 2 PUFFS INTO THE LUNGS EVERY MORNING 08/03/15  Yes [name], MD  albuterol (PROVENTIL HFA;VENTOLIN HFA) 108 (90 BASE) MCG/ACT inhaler Inhale 1-2 puffs into the lungs every 6 (six) hours as needed for wheezing or shortness of breath.    Historical Provider, MD  diphenhydrAMINE (BENADRYL) 25 MG tablet Take 25 mg by mouth every 6 (six) hours as needed for itching or allergies (congestion).     Historical Provider, MD  doxercalciferol (HECTOROL) 4 MCG/2ML injection Inject 0.5 mLs (1 mcg total) into the vein every Monday, Wednesday, and Friday with hemodialysis. 08/24/14   [name], DO  ferric gluconate 125 mg in sodium chloride 0.9 % 100 mL Inject 125 mg into the vein every Monday, Wednesday, and Friday with hemodialysis. 08/24/14   [name], DO  guaiFENesin (MUCINEX) 600 MG 12 hr tablet Take 1 tablet (600 mg total) by mouth 2 (two) times daily as needed (congestion). 03/04/15   Dixie Dials, MD   BP 113/92 mmHg  Pulse 93  Temp(Src) 98 F (36.7 C) (Oral)  Resp 18  Ht 5\' 4"  (1.626 m)  Wt 138 lb 3.7 oz (62.7 kg)  BMI 23.72  kg/m2  SpO2 97% Physical Exam Physical Exam  Nursing note and vitals reviewed. Constitutional: Chronically ill appearing, non-toxic, and in no acute distress, appears frail and deconditioned Head: Normocephalic and atraumatic.  Mouth/Throat: Oropharynx is clear and moist.  Neck: Normal range of motion. Neck supple.  Cardiovascular: Tachycardic rate and irregularly irregular rhythm.  +1 edema in bilateral lower extremities Pulmonary/Chest: Effort normal and breath sounds normal.  Abdominal: Soft. There is no tenderness. There is no rebound and no guarding.  Musculoskeletal: Normal range of motion.  Neurological: Alert, no facial droop, fluent speech, moves all extremities symmetrically Skin: Skin is warm and dry.  Psychiatric: Cooperative  ED Course  Procedures (including critical care time) Labs Review Labs Reviewed  CBC WITH DIFFERENTIAL/PLATELET - Abnormal; Notable for  the following:    RBC 2.94 (*)    Hemoglobin 10.1 (*)    HCT 32.5 (*)    MCV 110.5 (*)    MCH 34.4 (*)    RDW 24.5 (*)    Platelets 55 (*)    All other components within normal limits  COMPREHENSIVE METABOLIC PANEL - Abnormal; Notable for the following:    Chloride 96 (*)    BUN 37 (*)    Creatinine, Ser 8.11 (*)    Calcium 8.0 (*)    Albumin 3.1 (*)    ALT 13 (*)    GFR calc non Af Amer 6 (*)    GFR calc Af Amer 7 (*)    All other components within normal limits  PHOSPHORUS - Abnormal; Notable for the following:    Phosphorus 6.2 (*)    All other components within normal limits  BRAIN NATRIURETIC PEPTIDE - Abnormal; Notable for the following:    B Natriuretic Peptide 764.8 (*)    All other components within normal limits  MAGNESIUM  BASIC METABOLIC PANEL  LIPID PANEL  CBC  PROTIME-INR  I-STAT TROPOININ, ED    Imaging Review Dg Chest Portable 1 View  09/28/2015  CLINICAL DATA:  Chest pain for 1 day EXAM: PORTABLE CHEST 1 VIEW COMPARISON:  09/16/2015 FINDINGS: Moderate enlargement of the  cardiopericardial silhouette observed with a moderate right pleural effusion and airspace opacity at the right lung base. Apical lordotic projection. No active pulmonary edema identified. VP shunt tubing observed. A more lateral VP shunt tube to the right appears discontinuous worse the more medial VP shunt tube appears continuous where visualized. IMPRESSION: 1. Moderate enlargement of the cardiopericardial silhouette, but no edema currently. 2. Moderate right pleural effusion. Airspace opacity at the right lung base, potentially atelectasis or pneumonia. Overall the amount of aeration in the right mid lung and right lung base is improved compared to 09/16/15. Electronically Signed   By: Van Clines M.D.   On: 09/28/2015 19:43   I have personally reviewed and evaluated these images and lab results as part of my medical decision-making.   EKG Interpretation   Date/Time:  Tuesday September 28 2015 18:07:19 EDT Ventricular Rate:  116 PR Interval:    QRS Duration: 86 QT Interval:  322 QTC Calculation: 447 R Axis:   24 Text Interpretation:  Atrial fibrillation Paired ventricular premature  complexes ST depression in lateral and inferior leads similar to prior EKG   Atrial fibrillation not on prior EKG. History of PAF Reconfirmed by Lavoy Bernards  MD, Caileb Rhue [phone]) on 09/28/2015 6:14:48 PM      MDM   Final diagnoses:  Atrial fibrillation with RVR ([place])    69 year old male with history of paroxysmal atrial fibrillation, without anticoagulation, end-stage renal disease on hemodialysis and COPD who presents with atrial fibrillation with RVR and episode of chest pain. He is chronically ill-appearing, frail, and appears deconditioned but is currently in no acute distress. Reports that his chest pain and fully resolved at the time of arrival to our emergency department. Is in atrial fibrillation with RVR at a rate between 110-140. No new ischemic changes are noted. Does not look significantly fluid  overloaded on exam. Blood work reveals no major electrolyte or metabolic derangements. Chest x-ray appears improved compared to a chest x-ray performed during his recent hospitalization does not show new evidence of edema or infiltrate. Started on diltiazem drip. Discussed with Dr. Doylene Canard. He will admit for ongoing management.    Forde Dandy,  MD 09/29/15 0109  Forde Dandy, MD 09/29/15 [phone]

## 2015-09-28 NOTE — Progress Notes (Signed)
Received pt report from [place].

## 2015-09-28 NOTE — ED Notes (Signed)
Per pharmacy tech, spouse has not given pt his Cardizem in several days. Plan for re-education from pharmacy resident.

## 2015-09-28 NOTE — H&P (Signed)
Referring Physician:  KANDEN CAREY is an 69 y.o. male.                       Chief Complaint: Chest pain  HPI: 69 year old male with history of end-stage renal disease on hemodialysis, paroxysmal atrial fibrillation not on anticoagulation due to significant thrombocytopenia and COPD presents with atrial fibrillation with RVR. He was recently discharged from the hospital 8 days ago after prolonged hospitalization for a cardiac arrest, felt to be due to respiratory failure in the setting of fluid overload and hospital-acquired pneumonia. Wife reports that he continues to be weak at home, although slowly improving. He has been eating and drinking with good appetite. Went to dialysis yesterday with full treatment. He had routine follow-up with his primary care provider today, complained of mild chest pain. He was noted to be in atrial fibrillation with RVR and subsequently sent to our ED for evaluation. On arrival, he says that his pain had now fully resolved. He did not have any associated shortness of breath, lightheadedness, syncope, near syncope, diaphoresis, nausea, vomiting, or palpitations. Had diarrhea in the hospital, which is significantly improved since he has been taking Flagyl. Denies cough, congestion, fevers, or chills.  Past Medical History  Diagnosis Date  . Anemia   . Diastolic dysfunction   . Retinitis pigmentosa     Blindness--has shunt --placed yrs ago in [place]..  . Arthritis     Gout  . Hypertension     dx--"long time"  . CKD (chronic kidney disease), stage IV ([place])     a. L upper extremity AV fistula created 07/2012.  [name] BPH (benign prostatic hyperplasia)   . Blind   . Gangrene of finger ([place])     a. 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  . Colon polyps   . Pericardial effusion     a. Noted on echo 10/2009. b. Again seen on echo 09/2013.  [name] CHF (congestive heart failure) ([place])   . COPD (chronic obstructive pulmonary disease) ([place])   . GI (gastrointestinal  bleed) feb 2016  . Irregular heart rate 03/03/2015  . Cardiac arrest [facility])       Past Surgical History  Procedure Laterality Date  . Cataract extraction w/ intraocular lens  implant, bilateral Bilateral   . Knee ligament reconstruction Left   . Av fistula placement  07/15/2012    Procedure: ARTERIOVENOUS (AV) FISTULA CREATION;  Surgeon: Rosetta Posner, MD;  Location: [place];  Service: Vascular;  Laterality: Left;  . Amputation  12/30/2012    Procedure: AMPUTATION DIGIT;  Surgeon: Tennis Must, MD;  Location: [place];  Service: Orthopedics;  Laterality: Left;  LEFT SMALL FINGER AMPUTATION  . Pericardiocentesis  09/2013  . Pericardial tap N/A 09/19/2013    Procedure: PERICARDIAL TAP;  Surgeon: Blane Ohara, MD;  Location: [facility] CATH LAB;  Service: Cardiovascular;  Laterality: N/A;    Family History  Problem Relation Age of Onset  . Ovarian cancer Mother   . Kidney disease Neg Hx   . Gallbladder disease Neg Hx   . Esophageal cancer Neg Hx   . Heart disease Neg Hx   . Stomach cancer Neg Hx   . Colon polyps Brother   . Colon cancer Brother    Social History:  reports that he has been smoking Cigarettes.  He has a 25 pack-year smoking history. He has never used smokeless tobacco. He reports that he does not drink alcohol or  use illicit drugs.  Allergies:  Allergies  Allergen Reactions  . Ibuprofen Other (See Comments)    Kidney problems-dialysis patient  . Nsaids Other (See Comments)    Kidney problems-dialysis patient     (Not in a hospital admission)  Results for orders placed or performed during the hospital encounter of 09/28/15 (from the past 48 hour(s))  Brain natriuretic peptide     Status: Abnormal   Collection Time: 09/28/15  6:42 PM  Result Value Ref Range   B Natriuretic Peptide 764.8 (H) 0.0 - 100.0 pg/mL  I-Stat Troponin, ED (not at [facility])     Status: None   Collection Time: 09/28/15  6:46 PM  Result Value Ref Range   Troponin i, poc 0.02 0.00 -  0.08 ng/mL   Comment 3            Comment: Due to the release kinetics of cTnI, a negative result within the first hours of the onset of symptoms does not rule out myocardial infarction with certainty. If myocardial infarction is still suspected, repeat the test at appropriate intervals.   CBC with Differential     Status: Abnormal (Preliminary result)   Collection Time: 09/28/15  6:51 PM  Result Value Ref Range   WBC 6.3 4.0 - 10.5 K/uL   RBC 2.94 (L) 4.22 - 5.81 MIL/uL   Hemoglobin 10.1 (L) 13.0 - 17.0 g/dL   HCT 32.5 (L) 39.0 - 52.0 %   MCV 110.5 (H) 78.0 - 100.0 fL   MCH 34.4 (H) 26.0 - 34.0 pg   MCHC 31.1 30.0 - 36.0 g/dL   RDW 24.5 (H) 11.5 - 15.5 %   Platelets PENDING 150 - 400 K/uL   Neutrophils Relative % 80 %   Lymphocytes Relative 13 %   Monocytes Relative 7 %   Eosinophils Relative 0 %   Basophils Relative 0 %   Neutro Abs 5.1 1.7 - 7.7 K/uL   Lymphs Abs 0.8 0.7 - 4.0 K/uL   Monocytes Absolute 0.4 0.1 - 1.0 K/uL   Eosinophils Absolute 0.0 0.0 - 0.7 K/uL   Basophils Absolute 0.0 0.0 - 0.1 K/uL   RBC Morphology POLYCHROMASIA PRESENT     Comment: OVAL MACROCYTES   Smear Review LARGE PLATELETS PRESENT   Comprehensive metabolic panel     Status: Abnormal   Collection Time: 09/28/15  6:51 PM  Result Value Ref Range   Sodium 138 135 - 145 mmol/L   Potassium 3.8 3.5 - 5.1 mmol/L   Chloride 96 (L) 101 - 111 mmol/L   CO2 28 22 - 32 mmol/L   Glucose, Bld 87 65 - 99 mg/dL   BUN 37 (H) 6 - 20 mg/dL   Creatinine, Ser 8.11 (H) 0.61 - 1.24 mg/dL   Calcium 8.0 (L) 8.9 - 10.3 mg/dL   Total Protein 6.7 6.5 - 8.1 g/dL   Albumin 3.1 (L) 3.5 - 5.0 g/dL   AST 21 15 - 41 U/L   ALT 13 (L) 17 - 63 U/L   Alkaline Phosphatase 55 38 - 126 U/L   Total Bilirubin 0.8 0.3 - 1.2 mg/dL   GFR calc non Af Amer 6 (L) >60 mL/min   GFR calc Af Amer 7 (L) >60 mL/min    Comment: (NOTE) The eGFR has been calculated using the CKD EPI equation. This calculation has not been validated in all  clinical situations. eGFR's persistently <60 mL/min signify possible Chronic Kidney Disease.    Anion gap 14 5 - 15  Magnesium     Status: None   Collection Time: 09/28/15  6:51 PM  Result Value Ref Range   Magnesium 1.9 1.7 - 2.4 mg/dL  Phosphorus     Status: Abnormal   Collection Time: 09/28/15  6:51 PM  Result Value Ref Range   Phosphorus 6.2 (H) 2.5 - 4.6 mg/dL   Dg Chest Portable 1 View  09/28/2015  CLINICAL DATA:  Chest pain for 1 day EXAM: PORTABLE CHEST 1 VIEW COMPARISON:  09/16/2015 FINDINGS: Moderate enlargement of the cardiopericardial silhouette observed with a moderate right pleural effusion and airspace opacity at the right lung base. Apical lordotic projection. No active pulmonary edema identified. VP shunt tubing observed. A more lateral VP shunt tube to the right appears discontinuous worse the more medial VP shunt tube appears continuous where visualized. IMPRESSION: 1. Moderate enlargement of the cardiopericardial silhouette, but no edema currently. 2. Moderate right pleural effusion. Airspace opacity at the right lung base, potentially atelectasis or pneumonia. Overall the amount of aeration in the right mid lung and right lung base is improved compared to 09/16/15. Electronically Signed   By: Van Clines M.D.   On: 09/28/2015 19:43    Review Of Systems Constitutional: No Weight Loss, No Weight Gain, Night Sweats, Fevers, Chills, Dizziness, Fatigue, or Generalized Weakness HEENT: No Headaches, Difficulty Swallowing,Tooth/Dental Problems,Sore Throat,  No Sneezing, Rhinitis, Ear Ache, Nasal Congestion, or Post Nasal Drip, + blindness with retinitis pigmentosa. Cardio-vascular: No Chest pain, Orthopnea, PND, Edema in Lower Extremities, Anasarca, Dizziness, Palpitations. H/O PVD.  Resp: No Dyspnea, No DOE, No Productive Cough, No Non-Productive Cough, No Hemoptysis, No Wheezing.  GI: No Heartburn, Indigestion, Abdominal Pain, Nausea, Vomiting, Diarrhea,  Hematemesis, Hematochezia, Melena, Change in Bowel Habits, Loss of Appetite  GU: Scanty urine production with CKD, V.  Musculoskeletal: No Joint Pain or Swelling, No Decreased Range of Motion, No Back Pain.  Neurologic: No Syncope, No Seizures, Muscle Weakness, Paresthesia, +Legal Blindness, No Vertigo, No Difficulty Walking,  Skin: No Rash or Lesions. Psych: No Change in Mood or Affect, No Depression or Anxiety, No Memory loss, No Confusion, or Hallucinations  Blood pressure 142/47, pulse 95, temperature 98 F (36.7 C), resp. rate 18, SpO2 97 %.  GEN: He is alert and oriented x 2; Averagely built and nourished. HEENT: Normocephalic and Atraumatic, Mucous membranes pink; PERRLA; EOM intact; No scleral icterus, Nares: Patent, Oropharynx: Clear,  Neck: 50 % ROM, No Cervical Lymphadenopathy nor Thyromegaly or Carotid Bruit; No JVD; CHEST WALL: No tenderness CHEST: Normal respiration, clear to auscultation bilaterally. HEART: Irregular rate and rhythm, tachycardic, II/VI systolic murmurs, no rubs or gallops BACK: No kyphosis or scoliosis; No CVA tenderness ABDOMEN: Positive Bowel Sounds, Scaphoid, Soft Non-Tender; No Masses, No Organomegaly. EXTREMITIES: No Cyanosis, Clubbing, or Edema; S/P Left little finger amputation. SKIN: Normal hydration no rash or ulceration CNS: A x O x 2, Legal Blindness, No Focal Deficits  Assessment/Plan Atrial fibrillation with rapid ventricular response-[id number] score of 3/9 Possible multifocal atrial tachycardia ESRD with hemodialysis Hypertension PVD COPD Anemia of chronic disease Thrombocytopenia S/P Cardiac arrest S/P pneumonia S/P Diarrhea with C. Difficile colonization  Admit IV diltiazem Home medications DNR Hold heparin due to thrombocytopenia  Burnard Enis S, MD  09/28/2015, 8:40 PM

## 2015-09-29 ENCOUNTER — Inpatient Hospital Stay (HOSPITAL_COMMUNITY): Payer: Medicare Other

## 2015-09-29 LAB — LIPID PANEL
CHOL/HDL RATIO: 3.1 ratio
CHOLESTEROL: 74 mg/dL (ref 0–200)
HDL: 24 mg/dL — AB (ref >40)
LDL Cholesterol: 30 mg/dL (ref 0–99)
TRIGLYCERIDES: 100 mg/dL (ref <150)
VLDL: 20 mg/dL (ref 0–40)

## 2015-09-29 LAB — CBC
HCT: 28.2 % — ABNORMAL LOW (ref 39.0–52.0)
Hemoglobin: 8.5 g/dL — ABNORMAL LOW (ref 13.0–17.0)
MCH: 33.6 pg (ref 26.0–34.0)
MCHC: 30.1 g/dL (ref 30.0–36.0)
MCV: 111.5 fL — AB (ref 78.0–100.0)
PLATELETS: 46 10*3/uL — AB (ref 150–400)
RBC: 2.53 MIL/uL — AB (ref 4.22–5.81)
RDW: 24.7 % — ABNORMAL HIGH (ref 11.5–15.5)
WBC: 4.7 10*3/uL (ref 4.0–10.5)

## 2015-09-29 LAB — TROPONIN I
TROPONIN I: 0.68 ng/mL — AB (ref <0.031)
Troponin I: 0.82 ng/mL (ref <0.031)
Troponin I: 0.53 ng/mL (ref <0.031)

## 2015-09-29 LAB — BASIC METABOLIC PANEL
ANION GAP: 13 (ref 5–15)
BUN: 42 mg/dL — AB (ref 6–20)
CHLORIDE: 99 mmol/L — AB (ref 101–111)
CO2: 29 mmol/L (ref 22–32)
Calcium: 7.6 mg/dL — ABNORMAL LOW (ref 8.9–10.3)
Creatinine, Ser: 9.07 mg/dL — ABNORMAL HIGH (ref 0.61–1.24)
GFR, EST AFRICAN AMERICAN: 6 mL/min — AB (ref >60)
GFR, EST NON AFRICAN AMERICAN: 5 mL/min — AB (ref >60)
Glucose, Bld: 108 mg/dL — ABNORMAL HIGH (ref 65–99)
POTASSIUM: 3.6 mmol/L (ref 3.5–5.1)
SODIUM: 141 mmol/L (ref 135–145)

## 2015-09-29 LAB — PROTIME-INR
INR: 1.27 (ref 0.00–1.49)
PROTHROMBIN TIME: 16.1 s — AB (ref 11.6–15.2)

## 2015-09-29 MED ORDER — METRONIDAZOLE 500 MG PO TABS
500.0000 mg | ORAL_TABLET | Freq: Three times a day (TID) | ORAL | Status: DC
  Administered 2015-09-29 – 2015-10-01 (×6): 500 mg via ORAL
  Filled 2015-09-29 (×6): qty 1

## 2015-09-29 MED ORDER — DARBEPOETIN ALFA 150 MCG/0.3ML IJ SOSY: 150.0000 ug | PREFILLED_SYRINGE | INTRAMUSCULAR | Status: DC

## 2015-09-29 MED ORDER — DILTIAZEM HCL 60 MG PO TABS
60.0000 mg | ORAL_TABLET | Freq: Four times a day (QID) | ORAL | Status: DC
Start: 2015-09-29 — End: 2015-09-30
  Administered 2015-09-29 – 2015-09-30 (×6): 60 mg via ORAL
  Filled 2015-09-29 (×6): qty 1

## 2015-09-29 NOTE — Consult Note (Signed)
[facility] Consultation Note  Indication for Consultation:  Management of ESRD/hemodialysis; anemia, hypertension/volume and secondary hyperparathyroidism  HPI: [facility] is a 69 y.o. male with Atrial fibrillation with rapid ventricular response yesterday after presenting to his PCP  Office for visit co feeling bad with weakness and" pain in chest" started yesterday . Last hd on schedule Monday 10/17 left 4 kg above  His recent lowered  edw . Recently dc from [facility]  8 days ago after prolonged hospitalization for a cardiac arrest, felt  Sec. respiratory failure in the setting of fluid overload and hospital-acquired pneumonia. He denies any fevrs, chills, sob, cough ,dizziness, or syncope. Now in room feeling back to "normal".      Past Medical History  Diagnosis Date  . Anemia   . Diastolic dysfunction   . Retinitis pigmentosa     Blindness--has shunt --placed yrs ago in [facility]  . Arthritis     Gout  . Hypertension     dx--"long time"  . CKD (chronic kidney disease), stage IV ([facility])     a. L upper extremity AV fistula created 07/2012.  [facility] BPH (benign prostatic hyperplasia)   . Blind   . Gangrene of finger ([place])     a. 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  . Colon polyps   . Pericardial effusion     a. Noted on echo 10/2009. b. Again seen on echo 09/2013.  [facility] CHF (congestive heart failure) ([facility])   . COPD (chronic obstructive pulmonary disease) ([facility])   . GI (gastrointestinal bleed) feb 2016  . Irregular heart rate 03/03/2015  . Cardiac arrest [facility])     Past Surgical History  Procedure Laterality Date  . Cataract extraction w/ intraocular lens  implant, bilateral Bilateral   . Knee ligament reconstruction Left   . Av fistula placement  07/15/2012    Procedure: ARTERIOVENOUS (AV) FISTULA CREATION;  Surgeon: Rosetta Posner, MD;  Location: [facility];  Service: Vascular;  Laterality: Left;  . Amputation  12/30/2012    Procedure: AMPUTATION DIGIT;  Surgeon:  Tennis Must, MD;  Location: [facility];  Service: Orthopedics;  Laterality: Left;  LEFT SMALL FINGER AMPUTATION  . Pericardiocentesis  09/2013  . Pericardial tap N/A 09/19/2013    Procedure: PERICARDIAL TAP;  Surgeon: Blane Ohara, MD;  Location: [facility] CATH [facility];  Service: Cardiovascular;  Laterality: N/A;      Family History  Problem Relation Age of Onset  . Ovarian cancer Mother   . Kidney disease Neg Hx   . Gallbladder disease Neg Hx   . Esophageal cancer Neg Hx   . Heart disease Neg Hx   . Stomach cancer Neg Hx   . Colon polyps Brother   . Colon cancer Brother       reports that he has been smoking Cigarettes.  He has a 25 pack-year smoking history. He has never used smokeless tobacco. He reports that he does not drink alcohol or use illicit drugs.   Allergies  Allergen Reactions  . Ibuprofen Other (See Comments)    Kidney problems-dialysis patient  . Nsaids Other (See Comments)    Kidney problems-dialysis patient    Prior to Admission medications   Medication Sig Start Date End Date Taking? Authorizing Provider  acetaminophen (TYLENOL) 500 MG tablet Take 1,000 mg by mouth every 6 (six) hours as needed for moderate pain.   Yes Historical Provider, MD  allopurinol (ZYLOPRIM) 100 MG tablet Take 100 mg by  mouth daily.    Yes Historical Provider, MD  aspirin 81 MG tablet Take 81 mg by mouth daily.   Yes Historical Provider, MD  carvedilol (COREG) 12.5 MG tablet Take 1 tablet (12.5 mg total) by mouth 2 (two) times daily with a meal. 09/21/15  Yes Bonnielee Haff, MD  diltiazem (CARDIZEM) 60 MG tablet Take 60 mg by mouth 2 (two) times daily. 08/20/15  Yes Historical Provider, MD  metroNIDAZOLE (FLAGYL) 500 MG tablet Take 1 tablet (500 mg total) by mouth every 8 (eight) hours. For 11 more days 09/21/15  Yes Bonnielee Haff, MD  Multiple Vitamin (MULTIVITAMIN WITH MINERALS) TABS tablet Take 1 tablet by mouth daily.   Yes Historical Provider, MD  saccharomyces boulardii  (FLORASTOR) 250 MG capsule Take 1 capsule (250 mg total) by mouth 2 (two) times daily. For 15 days 09/21/15  Yes Bonnielee Haff, MD  SPIRIVA RESPIMAT 2.5 MCG/ACT AERS INHALE 2 PUFFS INTO THE LUNGS EVERY MORNING 08/03/15  Yes [name], MD  albuterol (PROVENTIL HFA;VENTOLIN HFA) 108 (90 BASE) MCG/ACT inhaler Inhale 1-2 puffs into the lungs every 6 (six) hours as needed for wheezing or shortness of breath.    Historical Provider, MD  diphenhydrAMINE (BENADRYL) 25 MG tablet Take 25 mg by mouth every 6 (six) hours as needed for itching or allergies (congestion).     Historical Provider, MD  doxercalciferol (HECTOROL) 4 MCG/2ML injection Inject 0.5 mLs (1 mcg total) into the vein every Monday, Wednesday, and Friday with hemodialysis. 08/24/14   [name], DO  ferric gluconate 125 mg in sodium chloride 0.9 % 100 mL Inject 125 mg into the vein every Monday, Wednesday, and Friday with hemodialysis. 08/24/14   [name], DO  guaiFENesin (MUCINEX) 600 MG 12 hr tablet Take 1 tablet (600 mg total) by mouth 2 (two) times daily as needed (congestion). 03/04/15   Dixie Dials, MD    SFK:CLEXNTZGYFVCB, albuterol, diphenhydrAMINE, nitroGLYCERIN, ondansetron (ZOFRAN) IV  Results for orders placed or performed during the hospital encounter of 09/28/15 (from the past 48 hour(s))  Brain natriuretic peptide     Status: Abnormal   Collection Time: 09/28/15  6:42 PM  Result Value Ref Range   B Natriuretic Peptide 764.8 (H) 0.0 - 100.0 pg/mL  I-Stat Troponin, ED (not at [facility])     Status: None   Collection Time: 09/28/15  6:46 PM  Result Value Ref Range   Troponin i, poc 0.02 0.00 - 0.08 ng/mL   Comment 3            Comment: Due to the release kinetics of cTnI, a negative result within the first hours of the onset of symptoms does not rule out myocardial infarction with certainty. If myocardial infarction is still suspected, repeat the test at appropriate intervals.   CBC with Differential     Status:  Abnormal   Collection Time: 09/28/15  6:51 PM  Result Value Ref Range   WBC 6.3 4.0 - 10.5 K/uL   RBC 2.94 (L) 4.22 - 5.81 MIL/uL   Hemoglobin 10.1 (L) 13.0 - 17.0 g/dL   HCT 32.5 (L) 39.0 - 52.0 %   MCV 110.5 (H) 78.0 - 100.0 fL   MCH 34.4 (H) 26.0 - 34.0 pg   MCHC 31.1 30.0 - 36.0 g/dL   RDW 24.5 (H) 11.5 - 15.5 %   Platelets 55 (L) 150 - 400 K/uL    Comment: REPEATED TO VERIFY SPECIMEN CHECKED FOR CLOTS PLATELET COUNT CONFIRMED BY SMEAR  Neutrophils Relative % 80 %   Lymphocytes Relative 13 %   Monocytes Relative 7 %   Eosinophils Relative 0 %   Basophils Relative 0 %   Neutro Abs 5.1 1.7 - 7.7 K/uL   Lymphs Abs 0.8 0.7 - 4.0 K/uL   Monocytes Absolute 0.4 0.1 - 1.0 K/uL   Eosinophils Absolute 0.0 0.0 - 0.7 K/uL   Basophils Absolute 0.0 0.0 - 0.1 K/uL   RBC Morphology POLYCHROMASIA PRESENT     Comment: OVAL MACROCYTES   Smear Review LARGE PLATELETS PRESENT   Comprehensive metabolic panel     Status: Abnormal   Collection Time: 09/28/15  6:51 PM  Result Value Ref Range   Sodium 138 135 - 145 mmol/L   Potassium 3.8 3.5 - 5.1 mmol/L   Chloride 96 (L) 101 - 111 mmol/L   CO2 28 22 - 32 mmol/L   Glucose, Bld 87 65 - 99 mg/dL   BUN 37 (H) 6 - 20 mg/dL   Creatinine, Ser 8.11 (H) 0.61 - 1.24 mg/dL   Calcium 8.0 (L) 8.9 - 10.3 mg/dL   Total Protein 6.7 6.5 - 8.1 g/dL   Albumin 3.1 (L) 3.5 - 5.0 g/dL   AST 21 15 - 41 U/L   ALT 13 (L) 17 - 63 U/L   Alkaline Phosphatase 55 38 - 126 U/L   Total Bilirubin 0.8 0.3 - 1.2 mg/dL   GFR calc non Af Amer 6 (L) >60 mL/min   GFR calc Af Amer 7 (L) >60 mL/min    Comment: (NOTE) The eGFR has been calculated using the CKD EPI equation. This calculation has not been validated in all clinical situations. eGFR's persistently <60 mL/min signify possible Chronic Kidney Disease.    Anion gap 14 5 - 15  Magnesium     Status: None   Collection Time: 09/28/15  6:51 PM  Result Value Ref Range   Magnesium 1.9 1.7 - 2.4 mg/dL  Phosphorus      Status: Abnormal   Collection Time: 09/28/15  6:51 PM  Result Value Ref Range   Phosphorus 6.2 (H) 2.5 - 4.6 mg/dL  Basic metabolic panel     Status: Abnormal   Collection Time: 09/29/15  2:58 AM  Result Value Ref Range   Sodium 141 135 - 145 mmol/L   Potassium 3.6 3.5 - 5.1 mmol/L   Chloride 99 (L) 101 - 111 mmol/L   CO2 29 22 - 32 mmol/L   Glucose, Bld 108 (H) 65 - 99 mg/dL   BUN 42 (H) 6 - 20 mg/dL   Creatinine, Ser 9.07 (H) 0.61 - 1.24 mg/dL   Calcium 7.6 (L) 8.9 - 10.3 mg/dL   GFR calc non Af Amer 5 (L) >60 mL/min   GFR calc Af Amer 6 (L) >60 mL/min    Comment: (NOTE) The eGFR has been calculated using the CKD EPI equation. This calculation has not been validated in all clinical situations. eGFR's persistently <60 mL/min signify possible Chronic Kidney Disease.    Anion gap 13 5 - 15  Lipid panel     Status: Abnormal   Collection Time: 09/29/15  2:58 AM  Result Value Ref Range   Cholesterol 74 0 - 200 mg/dL   Triglycerides 100 <150 mg/dL   HDL 24 (L) >40 mg/dL   Total CHOL/HDL Ratio 3.1 RATIO   VLDL 20 0 - 40 mg/dL   LDL Cholesterol 30 0 - 99 mg/dL    Comment:  Total Cholesterol/HDL:CHD Risk Coronary Heart Disease Risk Table                     Men   Women  1/2 Average Risk   3.4   3.3  Average Risk       5.0   4.4  2 X Average Risk   9.6   7.1  3 X Average Risk  23.4   11.0        Use the calculated Patient Ratio above and the CHD Risk Table to determine the patient's CHD Risk.        ATP III CLASSIFICATION (LDL):  <100     mg/dL   Optimal  100-129  mg/dL   Near or Above                    Optimal  130-159  mg/dL   Borderline  160-189  mg/dL   High  >190     mg/dL   Very High   CBC     Status: Abnormal   Collection Time: 09/29/15  2:58 AM  Result Value Ref Range   WBC 4.7 4.0 - 10.5 K/uL   RBC 2.53 (L) 4.22 - 5.81 MIL/uL   Hemoglobin 8.5 (L) 13.0 - 17.0 g/dL   HCT 28.2 (L) 39.0 - 52.0 %   MCV 111.5 (H) 78.0 - 100.0 fL   MCH 33.6 26.0 - 34.0 pg    MCHC 30.1 30.0 - 36.0 g/dL   RDW 24.7 (H) 11.5 - 15.5 %   Platelets 46 (L) 150 - 400 K/uL    Comment: CONSISTENT WITH PREVIOUS RESULT  Protime-INR     Status: Abnormal   Collection Time: 09/29/15  2:58 AM  Result Value Ref Range   Prothrombin Time 16.1 (H) 11.6 - 15.2 seconds   INR 1.27 0.00 - 1.49  Troponin I     Status: Abnormal   Collection Time: 09/29/15 10:27 AM  Result Value Ref Range   Troponin I 0.82 (HH) <0.031 ng/mL    Comment:        POSSIBLE MYOCARDIAL ISCHEMIA. SERIAL TESTING RECOMMENDED. CRITICAL RESULT CALLED TO, READ BACK BY AND VERIFIED WITH: FRINK L RN 09/29/15 1133 COSTELLO B      ROS: see hpi  Physical Exam: Filed Vitals:   09/29/15 1306  BP: 127/36  Pulse: 62  Temp: 98.2 F (36.8 C)  Resp: 20     General: Alert BM , NAD, normal MS, Chronically ill Elderly BM HEENT: [place]/AT, Eyes-Brown,  Conjunctiva-Pink, Sclera-Non-icteric Neck: No JVD, No bruit Lungs: CTA, Bilateral. Cardiac: Rare irreg  VT 60s,. II/VI systolic murmur/ no rub or gallop Abdomen: Soft, non-tender. Extremities: No  Pedal edema present. . CNS: AxOx2, , moves all 4 extremities. Right handed. And  Legally blind. Skin: Warm and dry Dialysis Access: pos bruit L UA AVF  Dialysis Orders: [facility]: [facility] on MWF . EDW 59.5 HD Bath 2k,2.25ca  Time 4hr Heparin none. Access LUAAVF    Hectorol 7 mcg IV/HD  Mircera 228mg q 2wks   Units IV/HD  Venofer 1038m x5 last dose on 10/04/15 Other op labs=  hgb 8.9 10/19, ca 9.3, phos 5.6  pth 203  Problem /plan=  1. Atrial fibrillation with rapid vecular response- per Card . 2. ESRD -  HD on schedule today MWF K=3.6 3. Hypertension/volume  - bp stable and wt 62 bed wt needs standing wts (needs sl ^ edw ) fu bp and wts  With hd today uf  4. Anemia of ESRD - hgb 8.5  aranesp today  And fe load finish 5. Metabolic bone disease -  Iv vit d and binder 6. Copd- per admit 7. SP  Recent PNA/ resp failure- per admit  8. Thrombocytopenia- No hep   HD 9. DM - (legally blind)per admit   Ernest Haber, PA-C [place] [phone] 09/29/2015, 2:35 PM   Pt seen, examined and agree w A/P as above.  Kelly Splinter MD [employer] pager [phone]    cell [phone] 09/29/2015, 5:25 PM

## 2015-09-29 NOTE — Procedures (Signed)
Hemodialysis treatment completed. Normal time 4 hours but treatment ran for 3 hours today per Dr. Jonnie Finner. Patient tolerated well with one episode of hypotension resolved with 139ml NS bolus and decrease in UF. Total removed: 1kg. No compliants. No complications.  Clearnce Sorrel, RN, BSN 09/29/2015 7:26 PM

## 2015-09-29 NOTE — Progress Notes (Signed)
Ref: [name], MD   Subjective:  Back in sinus rhythm with stable blood pressure. Low platelets count with large platelets.  Objective:  Vital Signs in the last 24 hours: Temp:  [98 F (36.7 C)-98.1 F (36.7 C)] 98 F (36.7 C) (10/19 0424) Pulse Rate:  [43-122] 69 (10/19 0940) Cardiac Rhythm:  [-] Normal sinus rhythm (10/19 0700) Resp:  [13-22] 18 (10/19 0424) BP: (97-148)/(30-92) 120/30 mmHg (10/19 0940) SpO2:  [95 %-100 %] 96 % (10/19 0424) Weight:  [62 kg (136 lb 11 oz)-62.7 kg (138 lb 3.7 oz)] 62 kg (136 lb 11 oz) (10/19 0424)  Physical Exam: BP Readings from Last 1 Encounters:  09/29/15 120/30    Wt Readings from Last 1 Encounters:  09/29/15 62 kg (136 lb 11 oz)    Weight change:   HEENT: [place]/AT, Eyes-Brown, PERL, EOMI, Conjunctiva-Pink, Sclera-Non-icteric Neck: No JVD, No bruit, Trachea midline. Lungs:  Clear, Bilateral. Cardiac:  Regular rhythm, normal S1 and S2, no S3. II/VI systolic murmur Abdomen:  Soft, non-tender. Extremities:  No edema present. No cyanosis. No clubbing. CNS: AxOx2, Cranial nerves grossly intact, moves all 4 extremities. Right handed. Legally blind. Skin: Warm and dry.   Intake/Output from previous day: 10/18 0701 - 10/19 0700 In: 390 [P.O.:390] Out: 0     Lab Results: BMET    Component Value Date/Time   NA 141 09/29/2015 0258   NA 138 09/28/2015 1851   NA 141 09/20/2015 0725   K 3.6 09/29/2015 0258   K 3.8 09/28/2015 1851   K 3.8 09/20/2015 0725   CL 99* 09/29/2015 0258   CL 96* 09/28/2015 1851   CL 101 09/20/2015 0725   CO2 29 09/29/2015 0258   CO2 28 09/28/2015 1851   CO2 20* 09/20/2015 0725   GLUCOSE 108* 09/29/2015 0258   GLUCOSE 87 09/28/2015 1851   GLUCOSE 107* 09/20/2015 0725   BUN 42* 09/29/2015 0258   BUN 37* 09/28/2015 1851   BUN 107* 09/20/2015 0725   CREATININE 9.07* 09/29/2015 0258   CREATININE 8.11* 09/28/2015 1851   CREATININE 10.05* 09/20/2015 0725   CALCIUM 7.6* 09/29/2015 0258   CALCIUM 8.0*  09/28/2015 1851   CALCIUM 8.2* 09/20/2015 0725   GFRNONAA 5* 09/29/2015 0258   GFRNONAA 6* 09/28/2015 1851   GFRNONAA 5* 09/20/2015 0725   GFRAA 6* 09/29/2015 0258   GFRAA 7* 09/28/2015 1851   GFRAA 5* 09/20/2015 0725   CBC    Component Value Date/Time   WBC 4.7 09/29/2015 0258   WBC 6.4 05/18/2009 1203   RBC 2.53* 09/29/2015 0258   RBC 2.55* 01/08/2015 2140   RBC 4.02* 05/18/2009 1203   HGB 8.5* 09/29/2015 0258   HGB 11.7* 05/18/2009 1203   HCT 28.2* 09/29/2015 0258   HCT 35.3* 05/18/2009 1203   PLT 46* 09/29/2015 0258   PLT 82* 05/18/2009 1203   MCV 111.5* 09/29/2015 0258   MCV 87.8 05/18/2009 1203   MCH 33.6 09/29/2015 0258   MCH 29.1 05/18/2009 1203   MCHC 30.1 09/29/2015 0258   MCHC 33.1 05/18/2009 1203   RDW 24.7* 09/29/2015 0258   RDW 17.5* 05/18/2009 1203   LYMPHSABS 0.8 09/28/2015 1851   LYMPHSABS 1.4 05/18/2009 1203   MONOABS 0.4 09/28/2015 1851   MONOABS 0.5 05/18/2009 1203   EOSABS 0.0 09/28/2015 1851   EOSABS 0.0 05/18/2009 1203   BASOSABS 0.0 09/28/2015 1851   BASOSABS 0.0 05/18/2009 1203   HEPATIC Function Panel  Recent Labs  01/11/15 1823 09/13/15 0657 09/28/15 1851  PROT 7.9 6.2* 6.7   HEMOGLOBIN A1C No components found for: HGA1C,  MPG CARDIAC ENZYMES Lab Results  Component Value Date   CKTOTAL 989* 09/15/2009   CKMB 3.6 09/15/2009   TROPONINI 0.09* 09/14/2015   TROPONINI 0.13* 09/13/2015   TROPONINI 0.10* 09/13/2015   BNP No results for input(s): PROBNP in the last 8760 hours. TSH  Recent Labs  03/04/15 1605  TSH 1.411   CHOLESTEROL  Recent Labs  09/29/15 0258  CHOL 74    Scheduled Meds: . allopurinol  100 mg Oral Daily  . aspirin EC  81 mg Oral Daily  . carvedilol  12.5 mg Oral BID WC  . diltiazem  60 mg Oral 4 times per day  . doxercalciferol  1 mcg Intravenous Q M,W,F-HD  . ferric gluconate (FERRLECIT/NULECIT) IV  125 mg Intravenous Q M,W,F-HD  . metroNIDAZOLE  500 mg Oral 3 times per day  . multivitamin with  minerals  1 tablet Oral Daily  . saccharomyces boulardii  250 mg Oral BID   Continuous Infusions:  PRN Meds:.acetaminophen, albuterol, diphenhydrAMINE, nitroGLYCERIN, ondansetron (ZOFRAN) IV  Assessment/Plan: Atrial fibrillation with rapid ventricular response-CHA2DS2VASc score of 3/9 Possible multifocal atrial tachycardia ESRD with hemodialysis Hypertension PVD COPD Anemia of chronic disease Thrombocytopenia S/P Cardiac arrest S/P pneumonia S/P Diarrhea with C. Difficile colonization  PO diltiazem Renal consult     LOS: 1 day    Dixie Dials  MD  09/29/2015, 9:44 AM

## 2015-09-29 NOTE — Progress Notes (Signed)
  Echocardiogram 2D Echocardiogram has been performed.  Diamond Nickel 09/29/2015, 12:36 PM

## 2015-09-29 NOTE — Progress Notes (Signed)
CRITICAL VALUE ALERT  Critical value received:  Troponin .82  Date of notification:  09/29/15  Time of notification:  11:36  Critical value read back:Yes.    Nurse who received alert:  Phylliss Bob  MD notified (1st page):  Doylene Canard  Time of first page:  11:37  MD notified (2nd page):  Time of second page:  Responding MD:  Doylene Canard  Time MD responded:  11:39

## 2015-09-29 NOTE — Discharge Instructions (Signed)

## 2015-09-29 NOTE — Progress Notes (Signed)
Called Dr. Doylene Canard about pt diltiazem drip, new order to give cardizem PO 60mg  q6H and d/c drip. Order carried out.

## 2015-09-29 NOTE — Progress Notes (Addendum)
Discussed treatment length with Dr. Jonnie Finner. Tx time decreased for today's treatment to 3 hours.

## 2015-09-29 NOTE — Procedures (Signed)
Patient was seen on dialysis and the procedure was supervised.  BFR 400  Via AVF BP is  108/43 .   Patient appears to be tolerating treatment well  [facility] A 09/29/2015

## 2015-09-30 MED ORDER — DILTIAZEM HCL ER COATED BEADS 120 MG PO CP24
120.0000 mg | ORAL_CAPSULE | Freq: Every day | ORAL | Status: DC
  Administered 2015-10-01: 120 mg via ORAL
  Filled 2015-09-30: qty 1

## 2015-09-30 NOTE — Progress Notes (Signed)
Ref: [name], MD   Subjective:  Awake and alert today. Afebrile.  Objective:  Vital Signs in the last 24 hours: Temp:  [97.8 F (36.6 C)-98.6 F (37 C)] 97.9 F (36.6 C) (10/20 1655) Pulse Rate:  [43-77] 43 (10/20 1655) Cardiac Rhythm:  [-] Normal sinus rhythm (10/20 0700) Resp:  [16-18] 16 (10/20 1655) BP: (97-133)/(27-51) 133/41 mmHg (10/20 1655) SpO2:  [94 %-100 %] 100 % (10/20 1655) Weight:  [62.7 kg (138 lb 3.7 oz)] 62.7 kg (138 lb 3.7 oz) (10/20 0448)  Physical Exam: BP Readings from Last 1 Encounters:  09/30/15 133/41    Wt Readings from Last 1 Encounters:  09/30/15 62.7 kg (138 lb 3.7 oz)    Weight change: 1.9 kg (4 lb 3 oz)  HEENT: [place]/AT, Eyes-Brown, PERL, EOMI, Conjunctiva-Pink, Sclera-Non-icteric Neck: No JVD, No bruit, Trachea midline. Lungs:  Clear, Bilateral. Cardiac:  Regular rhythm, normal S1 and S2, no S3. II/VI systolic murmur. Abdomen:  Soft, non-tender. Extremities:  No edema present. No cyanosis. No clubbing. CNS: AxOx3, Cranial nerves grossly intact, moves all 4 extremities. Right handed. Legally blind Skin: Warm and dry.   Intake/Output from previous day: 10/19 0701 - 10/20 0700 In: 720 [P.O.:720] Out: 1000     Lab Results: BMET    Component Value Date/Time   NA 141 09/29/2015 0258   NA 138 09/28/2015 1851   NA 141 09/20/2015 0725   K 3.6 09/29/2015 0258   K 3.8 09/28/2015 1851   K 3.8 09/20/2015 0725   CL 99* 09/29/2015 0258   CL 96* 09/28/2015 1851   CL 101 09/20/2015 0725   CO2 29 09/29/2015 0258   CO2 28 09/28/2015 1851   CO2 20* 09/20/2015 0725   GLUCOSE 108* 09/29/2015 0258   GLUCOSE 87 09/28/2015 1851   GLUCOSE 107* 09/20/2015 0725   BUN 42* 09/29/2015 0258   BUN 37* 09/28/2015 1851   BUN 107* 09/20/2015 0725   CREATININE 9.07* 09/29/2015 0258   CREATININE 8.11* 09/28/2015 1851   CREATININE 10.05* 09/20/2015 0725   CALCIUM 7.6* 09/29/2015 0258   CALCIUM 8.0* 09/28/2015 1851   CALCIUM 8.2* 09/20/2015 0725   GFRNONAA 5* 09/29/2015 0258   GFRNONAA 6* 09/28/2015 1851   GFRNONAA 5* 09/20/2015 0725   GFRAA 6* 09/29/2015 0258   GFRAA 7* 09/28/2015 1851   GFRAA 5* 09/20/2015 0725   CBC    Component Value Date/Time   WBC 4.7 09/29/2015 0258   WBC 6.4 05/18/2009 1203   RBC 2.53* 09/29/2015 0258   RBC 2.55* 01/08/2015 2140   RBC 4.02* 05/18/2009 1203   HGB 8.5* 09/29/2015 0258   HGB 11.7* 05/18/2009 1203   HCT 28.2* 09/29/2015 0258   HCT 35.3* 05/18/2009 1203   PLT 46* 09/29/2015 0258   PLT 82* 05/18/2009 1203   MCV 111.5* 09/29/2015 0258   MCV 87.8 05/18/2009 1203   MCH 33.6 09/29/2015 0258   MCH 29.1 05/18/2009 1203   MCHC 30.1 09/29/2015 0258   MCHC 33.1 05/18/2009 1203   RDW 24.7* 09/29/2015 0258   RDW 17.5* 05/18/2009 1203   LYMPHSABS 0.8 09/28/2015 1851   LYMPHSABS 1.4 05/18/2009 1203   MONOABS 0.4 09/28/2015 1851   MONOABS 0.5 05/18/2009 1203   EOSABS 0.0 09/28/2015 1851   EOSABS 0.0 05/18/2009 1203   BASOSABS 0.0 09/28/2015 1851   BASOSABS 0.0 05/18/2009 1203   HEPATIC Function Panel  Recent Labs  01/11/15 1823 09/13/15 0657 09/28/15 1851  PROT 7.9 6.2* 6.7   HEMOGLOBIN A1C No components  found for: HGA1C,  MPG CARDIAC ENZYMES Lab Results  Component Value Date   CKTOTAL 989* 09/15/2009   CKMB 3.6 09/15/2009   TROPONINI 0.53* 09/29/2015   TROPONINI 0.68* 09/29/2015   TROPONINI 0.82* 09/29/2015   BNP No results for input(s): PROBNP in the last 8760 hours. TSH  Recent Labs  03/04/15 1605  TSH 1.411   CHOLESTEROL  Recent Labs  09/29/15 0258  CHOL 74    Scheduled Meds: . allopurinol  100 mg Oral Daily  . aspirin EC  81 mg Oral Daily  . carvedilol  12.5 mg Oral BID WC  . [START ON 10/06/2015] darbepoetin (ARANESP) injection - DIALYSIS  150 mcg Intravenous Q Wed-HD  . diltiazem  60 mg Oral 4 times per day  . doxercalciferol  1 mcg Intravenous Q M,W,F-HD  . ferric gluconate (FERRLECIT/NULECIT) IV  125 mg Intravenous Q M,W,F-HD  . metroNIDAZOLE  500  mg Oral 3 times per day  . multivitamin with minerals  1 tablet Oral Daily  . saccharomyces boulardii  250 mg Oral BID   Continuous Infusions:  PRN Meds:.acetaminophen, albuterol, diphenhydrAMINE, nitroGLYCERIN, ondansetron (ZOFRAN) IV  Assessment/Plan: Atrial fibrillation with rapid ventricular response-CHA2DS2VASc score of 3/9-Now sinus rhythm Possible multifocal atrial tachycardia ESRD with hemodialysis Hypertension PVD COPD Anemia of chronic disease Thrombocytopenia S/P Cardiac arrest S/P pneumonia S/P Diarrhea with C. Difficile colonization  Change diltiazem to once daily and lower dose. Increase activity and home tomorrow post dialysis     LOS: 2 days    Dixie Dials  MD  09/30/2015, 7:45 PM

## 2015-09-30 NOTE — Progress Notes (Signed)
Subjective:  No cos , tolerated hd yest.  Objective Vital signs in last 24 hours: Filed Vitals:   09/29/15 2345 09/30/15 0448 09/30/15 0821 09/30/15 0900  BP: 97/51 101/39 130/47 120/38  Pulse: 67 77 72 73  Temp: 98.3 F (36.8 C) 98.6 F (37 C) 98.6 F (37 C) 97.8 F (36.6 C)  TempSrc: Oral Oral Oral Oral  Resp: 18 18 18 18   Height:      Weight:  62.7 kg (138 lb 3.7 oz)    SpO2: 100% 97% 99% 100%   Weight change: 1.9 kg (4 lb 3 oz)  Physical Exam:      General: Alert BM , NAD, Chronically ill Elderly BM Lungs: R side sl dcr bs otherwise  CTA Cardiac:RRR now II/VI systolic murmur/ no rub or gallop Abdomen: Soft, non-tender. Extremities: No Pedal edema. Dialysis Access: pos bruit L UA AVF  Dialysis Orders: Center: [facility] on MWF . EDW 59.5 HD Bath 2k,2.25ca Time 4hr Heparin none. Access LUAAVF  Hectorol 7 mcg IV/HD Mircera 291mcg q 2wks Units IV/HD Venofer 100mg  x5 last dose on 10/04/15 Other op labs= hgb 8.9 10/19, ca 9.3, phos 5.6 pth 203   Problem/Plan: 1. Atrial fibrillation with rapid vecular response- per Card .now in NS on exam 2. ESRD - HD on schedule  MWF  3. Hypertension/volume - bp stable  Hd 1 l uf last pm / new edw ~~ 62 bed wt needs standing wts (needs sl ^ edw ) fu bp and wts / no pul edme had r pl effusion on cxr10/18/16 4. Anemia of ESRD - hgb 8.5 aranesp wed hd(max mircera op) And fe load finish 5. Metabolic bone disease - Iv vit don hd and no binders as op fu phos  6.2 restart per op records 6. Copd- per admit 7. SP Recent PNA/ resp failure- per admit  8. Thrombocytopenia- No hep HD 9. DM - (legally blind)per admit       Cameron Haber, Cameron Mendez [place] [phone] 09/30/2015,12:36 PM  LOS: 2 days   Pt seen, examined and agree w A/P as above. No complaints today, no CP or SOB.  In NSR now.  Kelly Splinter MD [facility] pager [phone]    cell [phone] 09/30/2015, 1:46  PM    Labs: Basic Metabolic Panel:  Recent Labs Lab 09/28/15 1851 09/29/15 0258  NA 138 141  K 3.8 3.6  CL 96* 99*  CO2 28 29  GLUCOSE 87 108*  BUN 37* 42*  CREATININE 8.11* 9.07*  CALCIUM 8.0* 7.6*  PHOS 6.2*  --    Liver Function Tests:  Recent Labs Lab 09/28/15 1851  AST 21  ALT 13*  ALKPHOS 55  BILITOT 0.8  PROT 6.7  ALBUMIN 3.1*   No results for input(s): LIPASE, AMYLASE in the last 168 hours. No results for input(s): AMMONIA in the last 168 hours. CBC:  Recent Labs Lab 09/28/15 1851 09/29/15 0258  WBC 6.3 4.7  NEUTROABS 5.1  --   HGB 10.1* 8.5*  HCT 32.5* 28.2*  MCV 110.5* 111.5*  PLT 55* 46*   Cardiac Enzymes:  Recent Labs Lab 09/29/15 1027 09/29/15 1508 09/29/15 2115  TROPONINI 0.82* 0.68* 0.53*    Medications:   . allopurinol  100 mg Oral Daily  . aspirin EC  81 mg Oral Daily  . carvedilol  12.5 mg Oral BID WC  . [START ON 10/06/2015] darbepoetin (ARANESP) injection - DIALYSIS  150 mcg Intravenous Q Wed-HD  . diltiazem  60 mg Oral 4 times per day  . doxercalciferol  1 mcg Intravenous Q M,W,F-HD  . ferric gluconate (FERRLECIT/NULECIT) IV  125 mg Intravenous Q M,W,F-HD  . metroNIDAZOLE  500 mg Oral 3 times per day  . multivitamin with minerals  1 tablet Oral Daily  . saccharomyces boulardii  250 mg Oral BID

## 2015-09-30 NOTE — Evaluation (Signed)
Physical Therapy Evaluation Patient Details Name: Cameron Mendez MRN: [id number] DOB: 1946-11-25 Today's Date: 09/30/2015   History of Present Illness  Pt is a 69 y/o M with A fib with RVR yesterday after presenting to PCP office visit c/o feeling weak and chest pain.  Recently d/c from [facility] 8 days ago after prolonged hospitalization for cardiac arrest.  PMH COPD, CHF, blindness, CKD, HTN, arthritis.  Clinical Impression  Pt presents with impairments as indicated below.  Pt will benefit from continued acute PT services to maximize safety and independence with functional mobility.  PT recommends HHPT upon discharge.    Follow Up Recommendations Home health PT;Supervision/Assistance - 24 hour    Equipment Recommendations  Rolling walker with 5" wheels    Recommendations for Other Services       Precautions / Restrictions Precautions Precautions: Fall;Other (comment) Precaution Comments: pt blind Restrictions Weight Bearing Restrictions: No      Mobility  Bed Mobility Overal bed mobility: Needs Assistance Bed Mobility: Supine to Sit     Supine to sit: Mod assist Sit to supine: Mod assist (for leg management and trunk support)      Transfers Overall transfer level: Needs assistance Equipment used: None Transfers: Sit to/from Stand Sit to Stand: Min guard         General transfer comment: Min guard for safety; verbal and tactile cues due to vision impairment  Ambulation/Gait Ambulation/Gait assistance: Min guard Ambulation Distance (Feet): 80 Feet Assistive device: None Gait Pattern/deviations: Decreased stride length;Shuffle Gait velocity: guarded due to unfamilar with surroundings Gait velocity interpretation: Below normal speed for age/gender General Gait Details: Some instability with walking.  Min guard for safety and to guide pt secondary to vision difficulties  Stairs            Wheelchair Mobility    Modified Rankin (Stroke Patients Only)        Balance     Sitting balance-Leahy Scale: Good       Standing balance-Leahy Scale: Good                               Pertinent Vitals/Pain      Home Living Family/patient expects to be discharged to:: Private residence Living Arrangements: Spouse/significant other;Children Available Help at Discharge: Family;Available PRN/intermittently (wife works during the day, pt reports his sister can come check on him occassionally) Type of Home: House Home Access: Stairs to enter Entrance Stairs-Rails: None Entrance Stairs-Number of Steps: 4 (front door) Home Layout: One level Home Equipment: None Additional Comments: HD M, W, F.  Able to shower self in standing, occasional assist from wife    Prior Function Level of Independence: Independent         Comments: wife does meal prep due to pt's vision deficits     Hand Dominance   Dominant Hand: Right    Extremity/Trunk Assessment   Upper Extremity Assessment: Overall WFL for tasks assessed           Lower Extremity Assessment: Overall WFL for tasks assessed      Cervical / Trunk Assessment: Normal  Communication   Communication: No difficulties (slow speech)  Cognition Arousal/Alertness: Awake/alert Behavior During Therapy: WFL for tasks assessed/performed Overall Cognitive Status: Within Functional Limits for tasks assessed                      General Comments General comments (skin integrity, edema, etc.): VSS throughout.  No increased SOB.    Exercises        Assessment/Plan    PT Assessment Patient needs continued PT services  PT Diagnosis Difficulty walking;Abnormality of gait   PT Problem List Decreased activity tolerance;Decreased balance;Decreased mobility;Decreased knowledge of use of DME;Cardiopulmonary status limiting activity  PT Treatment Interventions DME instruction;Gait training;Stair training;Functional mobility training;Therapeutic activities;Therapeutic  exercise;Balance training;Neuromuscular re-education;Patient/family education   PT Goals (Current goals can be found in the Care Plan section) Acute Rehab PT Goals Patient Stated Goal: get stronger and go home PT Goal Formulation: With patient Time For Goal Achievement: 10/02/15 Potential to Achieve Goals: Good    Frequency Min 3X/week   Barriers to discharge        Co-evaluation               End of Session Equipment Utilized During Treatment: Gait belt Activity Tolerance: Patient tolerated treatment well Patient left: in chair;with call bell/phone within reach (MD present) Nurse Communication: Mobility status;Other (comment)         Time: 2353-6144 PT Time Calculation (min) (ACUTE ONLY): 20 min   Charges:   PT Evaluation $Initial PT Evaluation Tier I: 1 Procedure     PT G Codes:        Analeigha Nauman 10-29-15, 5:01 PM  Lorita Officer, SPT

## 2015-09-30 NOTE — Clinical Documentation Improvement (Signed)
Cardiology  Please provide greater specificity of Diabetes Mellitus and associated manifestations. Thank you     1.Please specify type:   Type 1   Type 2   Other (please specify)   Unable to determine    2. Please specify status:    With hyperglycemia (poorly controlled, out of control, etc.)   With hypoglycemia   Other (please specify)    Unable to determine    Please specify any and all manifestations:  Arthropathy (e.g., Charcot's)  Neuropathy (e.g., gastroparesis, neuropathic ulcer, neuropathic impotence)  Ophthalmic manifestations (e.g., blindness, cataract, glaucoma, retinopathy)  Peripheral circulatory manifestations (e.g., angiopathy, gangrene, PVD [with ulcer], vascular impotence)  Skin (e.g., cellulitis, ulcer)  Other diabetic manifestations (such as osteomyelitis, esrd)(please specify)  Unable to determine     Please exercise your independent, professional judgment when responding. A specific answer is not anticipated or expected.   Thank You, [place] [phone]

## 2015-09-30 NOTE — Clinical Documentation Improvement (Signed)
Cardiology  Abnormal lab value BNP 764.8, please provide diagnosis if appropriate for this admission?  Thank you     Acuity - Acute, Chronic, Acute on Chronic   Type - Systolic, Diastolic, Systolic and Diastolic  Other  Clinically Undetermined     Please exercise your independent, professional judgment when responding. A specific answer is not anticipated or expected.   Thank You,  [place] [phone]

## 2015-09-30 NOTE — Progress Notes (Signed)
UR completed 

## 2015-10-01 MED ORDER — DOXERCALCIFEROL 4 MCG/2ML IV SOLN
INTRAVENOUS | Status: AC
  Administered 2015-10-01: 1 ug via INTRAVENOUS
  Filled 2015-10-01: qty 2

## 2015-10-01 MED ORDER — DILTIAZEM HCL ER COATED BEADS 120 MG PO CP24: 120.0000 mg | ORAL_CAPSULE | Freq: Every day | ORAL | Status: AC

## 2015-10-01 NOTE — Progress Notes (Signed)
Pt being dc to home with wife, instructions given, pt stable

## 2015-10-01 NOTE — Progress Notes (Signed)
Subjective:  On po dilt, in NSR. No complaints.   Objective Vital signs in last 24 hours: Filed Vitals:   09/30/15 1655 09/30/15 2100 10/01/15 0600 10/01/15 0935  BP: 133/41 104/49 120/43 116/52  Pulse: 43 60 71 73  Temp: 97.9 F (36.6 C) 99.1 F (37.3 C) 98.2 F (36.8 C)   TempSrc: Oral Oral Oral   Resp: 16 18 18    Height:      Weight:   62.8 kg (138 lb 7.2 oz)   SpO2: 100% 95% 97%    Weight change: -1.8 kg (-3 lb 15.5 oz)  Physical Exam:      General: Alert BM , NAD, Chronically ill Elderly BM Lungs: R side sl dcr bs otherwise  CTA Cardiac:RRR now II/VI systolic murmur/ no rub or gallop Abdomen: Soft, non-tender. Extremities: No Pedal edema. Dialysis Access: pos bruit L UA AVF  Dialysis Orders: Center: [facility] on MWF . EDW 59.5 HD Bath 2k,2.25ca Time 4hr Heparin none. Access LUAAVF  Hectorol 7 mcg IV/HD Mircera 22mcg q 2wks Units IV/HD Venofer 100mg  x5 last dose on 10/04/15 Other op labs= hgb 8.9 10/19, ca 9.3, phos 5.6 pth 203   Assessment: 1. Afib/ RVR - in NSR, on po dilt. No anticoagulation. Per cards for dc today after HD 2. ESRD HD mwf 3. Volume is up 2-3kg, no resp sx's 4. Anemia of ESRD - hgb 8.5 aranesp wed hd(max mircera op) And fe load finish 5. Metabolic bone disease - Iv vit don hd and no binders as op fu phos  6.2 restart per op records 6. Copd- per admit 7. SP Recent PNA/ resp failure- per admit  8. Thrombocytopenia- No hep HD 9. DM - (legally blind)per admit 10. Dispo - for dc likely today after HD      .Plan - HD today  Kelly Splinter MD [facility] pager [phone]    cell [phone] 10/01/2015, 10:21 AM        Labs: Basic Metabolic Panel:  Recent Labs Lab 09/28/15 1851 09/29/15 0258  NA 138 141  K 3.8 3.6  CL 96* 99*  CO2 28 29  GLUCOSE 87 108*  BUN 37* 42*  CREATININE 8.11* 9.07*  CALCIUM 8.0* 7.6*  PHOS 6.2*  --    Liver Function Tests:  Recent Labs Lab 09/28/15 1851  AST 21   ALT 13*  ALKPHOS 55  BILITOT 0.8  PROT 6.7  ALBUMIN 3.1*   No results for input(s): LIPASE, AMYLASE in the last 168 hours. No results for input(s): AMMONIA in the last 168 hours. CBC:  Recent Labs Lab 09/28/15 1851 09/29/15 0258  WBC 6.3 4.7  NEUTROABS 5.1  --   HGB 10.1* 8.5*  HCT 32.5* 28.2*  MCV 110.5* 111.5*  PLT 55* 46*   Cardiac Enzymes:  Recent Labs Lab 09/29/15 1027 09/29/15 1508 09/29/15 2115  TROPONINI 0.82* 0.68* 0.53*    Medications:   . allopurinol  100 mg Oral Daily  . aspirin EC  81 mg Oral Daily  . carvedilol  12.5 mg Oral BID WC  . [START ON 10/06/2015] darbepoetin (ARANESP) injection - DIALYSIS  150 mcg Intravenous Q Wed-HD  . diltiazem  120 mg Oral Daily  . doxercalciferol  1 mcg Intravenous Q M,W,F-HD  . ferric gluconate (FERRLECIT/NULECIT) IV  125 mg Intravenous Q M,W,F-HD  . metroNIDAZOLE  500 mg Oral 3 times per day  . multivitamin with minerals  1 tablet Oral Daily  . saccharomyces boulardii  250 mg Oral BID

## 2015-10-01 NOTE — Progress Notes (Signed)
[facility]  Patient Status: Active (receiving services up to time of hospitalization)  [facility] is providing the following services: RN, PT, OT, ST and MSW  If patient discharges after hours, please call [phone].   Cameron Mendez 10/01/2015, 10:01 AM

## 2015-10-01 NOTE — Discharge Summary (Signed)
Physician Discharge Summary  Patient ID: JAQUESE IRVING MRN: [id number] DOB/AGE: Apr 14, 1946 69 y.o.  Admit date: 09/28/2015 Discharge date: 10/01/2015  Admission Diagnoses: Atrial fibrillation with rapid ventricular response-CHA2DS2VASc score of 3/9 Possible multifocal atrial tachycardia ESRD with hemodialysis Hypertension PVD COPD Anemia of chronic disease Thrombocytopenia S/P Cardiac arrest S/P pneumonia S/P Diarrhea with C. Difficile colonization  Discharge Diagnoses:  Principle Problems: *  Acute coronary syndrome (HCC) * Atrial fibrillation with rapid ventricular response-CHA2DS2VASc score of 3/9-Now sinus rhythm ESRD with hemodialysis Hypertension PVD COPD Anemia of chronic disease Thrombocytopenia S/P Cardiac arrest S/P pneumonia S/P Diarrhea with C. Difficile colonization Abnormal Troponin-I from demand ischemia Chronic left heart diastolic failure-improving Chronic right pleural effusion-improving  Discharged Condition: fair  Hospital Course: 69 year old male with history of end-stage renal disease on hemodialysis, paroxysmal atrial fibrillation not on anticoagulation due to significant thrombocytopenia and COPD presented with atrial fibrillation with RVR. He also had mild chest pain. His heart rate and rhythm improved with diltiazem IV followed by PO. His serial troponin-I x 3 were minimally elevated 9demand ischemia) and his BNP had significantly improved from 1766.2 to 764.8 in 2 weeks. His pulmonary vascular congestion had also improved. Due to low platelet count and other co-morbidities he was not considered for cardiac interventions and his heparin was also held. He had nephrology consult. Post hemodialysis he was less confused and was discharged home. He will take once daily long acting diltiazem to improve compliance. He will see his primary care doctor in 2 weeks and see me in 1 month and continue hemodialysis on Monday, Wednesday and Friday.  Consults:  cardiology and nephrology  Significant Diagnostic Studies: labs: Normal WBC count, Low platelets count of 55 K/uL. Very low total, LDL and HDL cholesterol levels of 74, 30 and 24 respectively. Minimally elevated troponin-I x 3. BNP of 764.8 from 1766.2  EKG on admission-Atrial fibrillation with RVR. F/U EKG-Sinus rhythm with short PR.  Treatments: cardiac meds: carvedilol and diltiazem  Discharge Exam: Blood pressure 107/41, pulse 78, temperature 98.3 F (36.8 C), temperature source Oral, resp. rate 19, height 5\' 4"  (1.626 m), weight 64 kg (141 lb 1.5 oz), SpO2 99 %. HEENT: /AT, Eyes-Brown, PERL, EOMI, Conjunctiva-Pink, Sclera-Non-icteric Neck: No JVD, No bruit, Trachea midline. Lungs: Clear, Bilateral. Cardiac: Regular rhythm, normal S1 and S2, no S3. II/VI systolic murmur. Abdomen: Soft, non-tender. Extremities: No edema present. No cyanosis. No clubbing. CNS: AxOx3, Cranial nerves grossly intact, moves all 4 extremities. Right handed. Legally blind Skin: Warm and dry.  Disposition: 06-Home-with Home Health Care Svc     Medication List    STOP taking these medications        diltiazem 60 MG tablet  Commonly known as:  CARDIZEM      TAKE these medications        acetaminophen 500 MG tablet  Commonly known as:  TYLENOL  Take 1,000 mg by mouth every 6 (six) hours as needed for moderate pain.     albuterol 108 (90 BASE) MCG/ACT inhaler  Commonly known as:  PROVENTIL HFA;VENTOLIN HFA  Inhale 1-2 puffs into the lungs every 6 (six) hours as needed for wheezing or shortness of breath.     allopurinol 100 MG tablet  Commonly known as:  ZYLOPRIM  Take 100 mg by mouth daily.     aspirin 81 MG tablet  Take 81 mg by mouth daily.     carvedilol 12.5 MG tablet  Commonly known as:  COREG  Take 1 tablet (12.5 mg total)  by mouth 2 (two) times daily with a meal.     diltiazem 120 MG 24 hr capsule  Commonly known as:  CARDIZEM CD  Take 1 capsule (120 mg total) by mouth  daily.     diphenhydrAMINE 25 MG tablet  Commonly known as:  BENADRYL  Take 25 mg by mouth every 6 (six) hours as needed for itching or allergies (congestion).     doxercalciferol 4 MCG/2ML injection  Commonly known as:  HECTOROL  Inject 0.5 mLs (1 mcg total) into the vein every Monday, Wednesday, and Friday with hemodialysis.     ferric gluconate 125 mg in sodium chloride 0.9 % 100 mL  Inject 125 mg into the vein every Monday, Wednesday, and Friday with hemodialysis.     guaiFENesin 600 MG 12 hr tablet  Commonly known as:  MUCINEX  Take 1 tablet (600 mg total) by mouth 2 (two) times daily as needed (congestion).     metroNIDAZOLE 500 MG tablet  Commonly known as:  FLAGYL  Take 1 tablet (500 mg total) by mouth every 8 (eight) hours. For 11 more days     multivitamin with minerals Tabs tablet  Take 1 tablet by mouth daily.     saccharomyces boulardii 250 MG capsule  Commonly known as:  FLORASTOR  Take 1 capsule (250 mg total) by mouth 2 (two) times daily. For 15 days     SPIRIVA RESPIMAT 2.5 MCG/ACT Aers  Generic drug:  Tiotropium Bromide Monohydrate  INHALE 2 PUFFS INTO THE LUNGS EVERY MORNING           Follow-up Information    Follow up with [facility].   Why:  They will continue to do your home health care at your home   Contact information:   [address] [place] [address] [phone]       Follow up with [name], MD. Schedule an appointment as soon as possible for a visit in 2 weeks.   Specialty:  Family Medicine   Contact information:   [address] [place] [address] [phone]       Follow up with [facility], MD. Schedule an appointment as soon as possible for a visit in 1 month.   Specialty:  Cardiology   Contact information:   [place] [address] [phone]       Signed: Birdie Riddle 10/01/2015, 5:37 PM

## 2015-10-01 NOTE — Progress Notes (Signed)
Patient is active with [facility] as prior to admission - RN, PT, OT, ST and MSW; Aneta Mins [phone]

## 2015-10-05 IMAGING — CT CT CHEST W/O CM
2 of 3 series · 13 of 36 positions shown, 16 images · non-contrast
Comparison: Portable chest radiographs 4747 hours today. Chest CT
01/09/2015.

CLINICAL DATA: 69-year-old male with worsening shortness of breath.
Cardiopulmonary arrest, CPR by EMS. Initial encounter. Dialysis,
COPD, cardiac diastolic dysfunction.

EXAM:
CT CHEST WITHOUT CONTRAST
TECHNIQUE: Multidetector CT imaging of the chest was performed following the
standard protocol without IV contrast.

[Series 201: chest without, idose (2) · axial · non-contrast · 0.63mm/px · z∈[-313,-63]mm · 10 of 60 slices shown, 13 images]
[im 5/60  mediastinal]
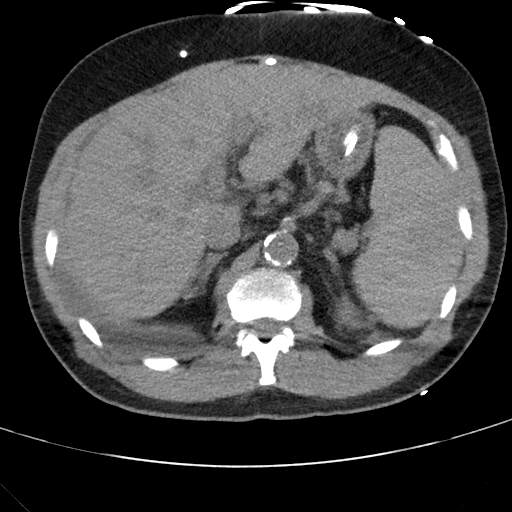
[im 5/60  lung]
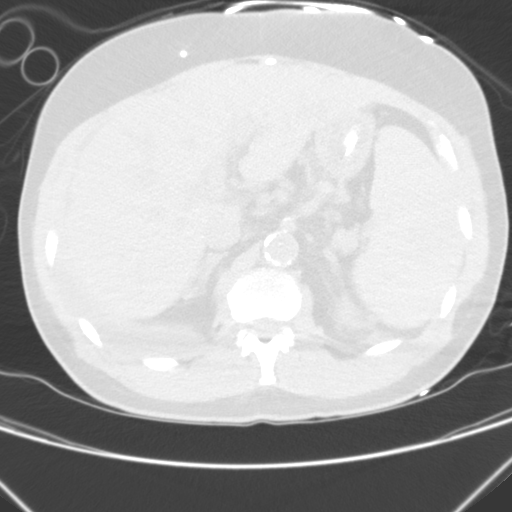
[im 9/60  lung]
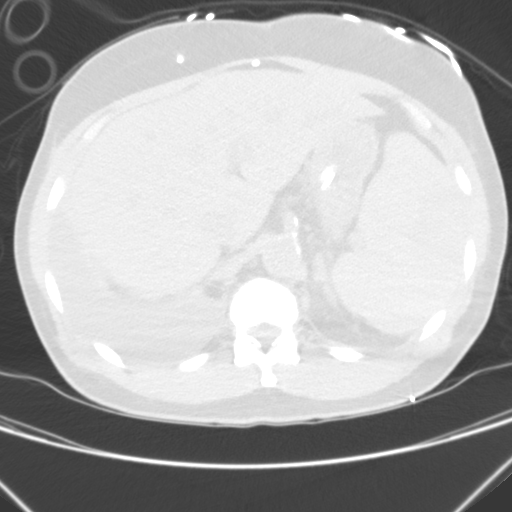
[im 16/60  lung]
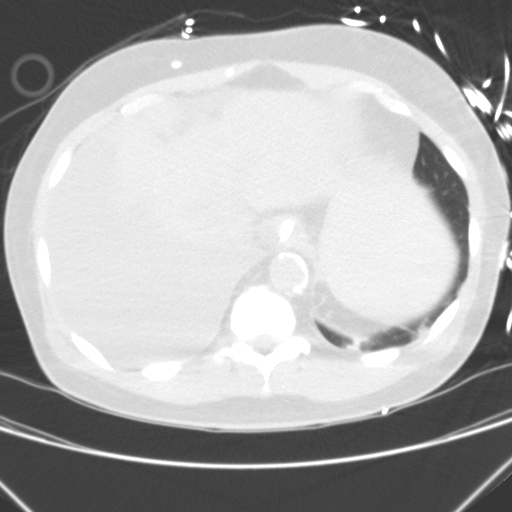
[im 22/60  lung]
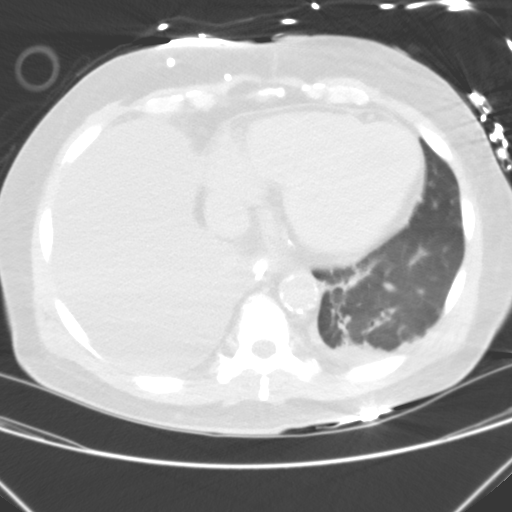
[im 27/60  mediastinal]
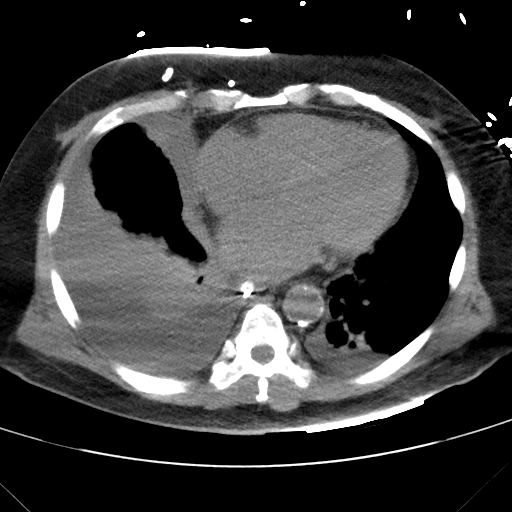
[im 27/60  lung]
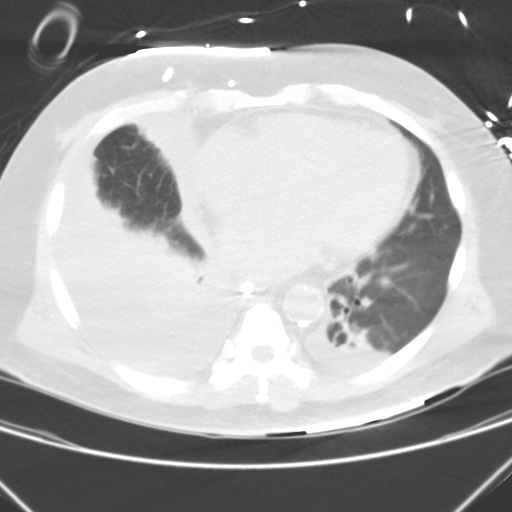
[im 33/60  lung]
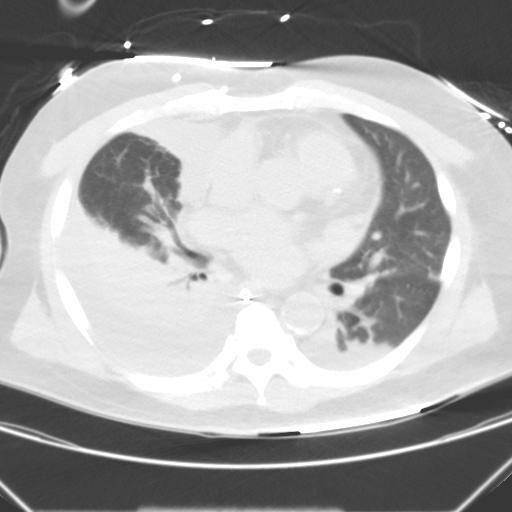
[im 38/60  lung]
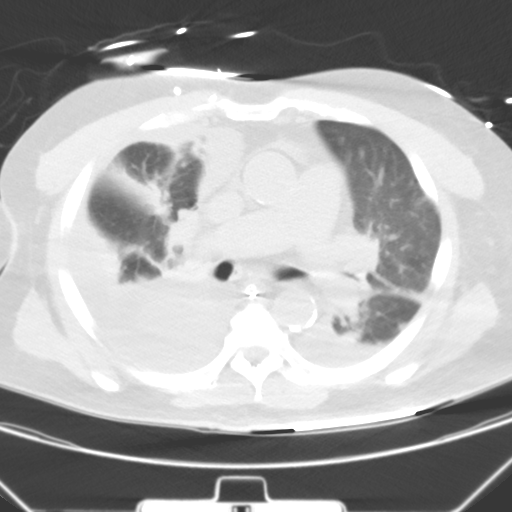
[im 44/60  lung]
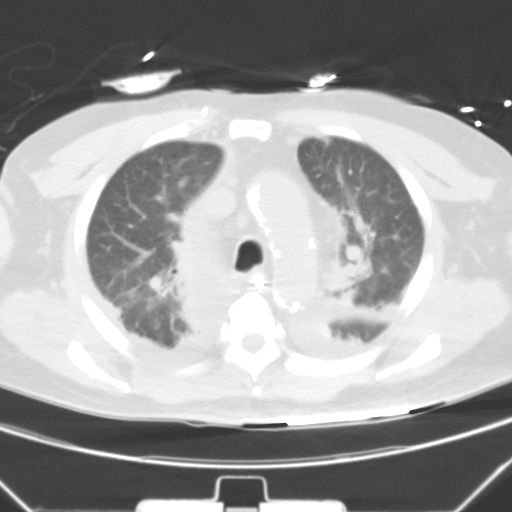
[im 51/60  mediastinal]
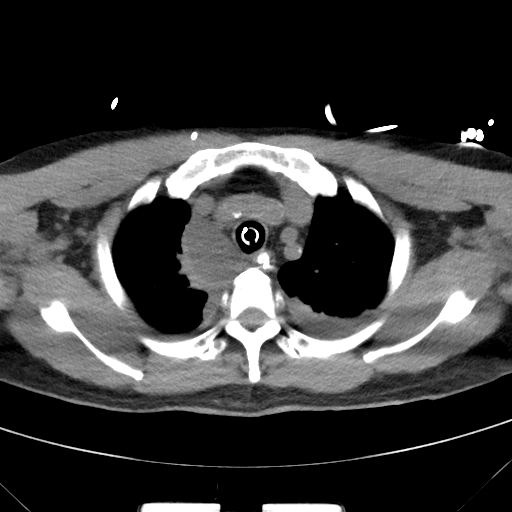
[im 51/60  lung]
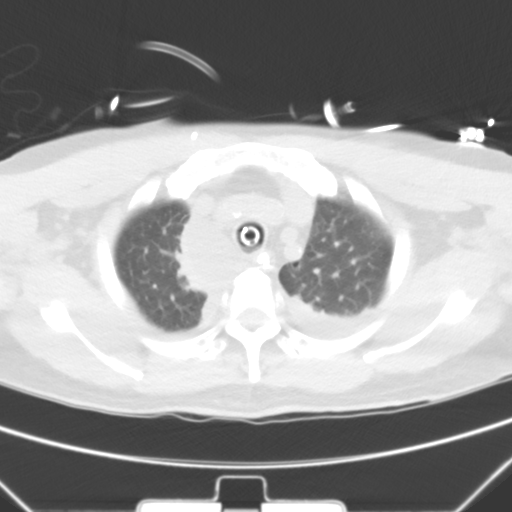
[im 55/60  lung]
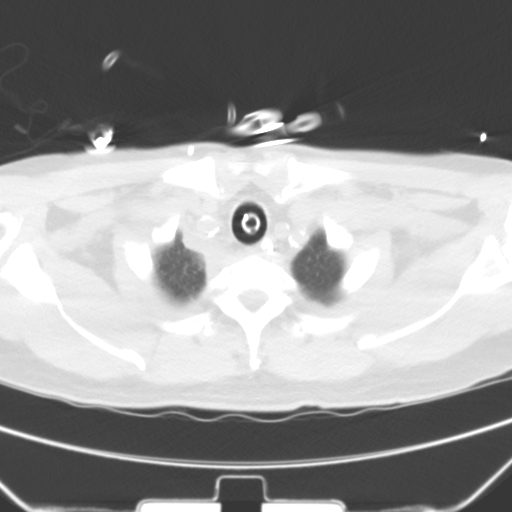

[Series 203: coronal, idose (2) · coronal · 0.45mm/px · 3 of 100 slices shown]
[im 20/100  lung]
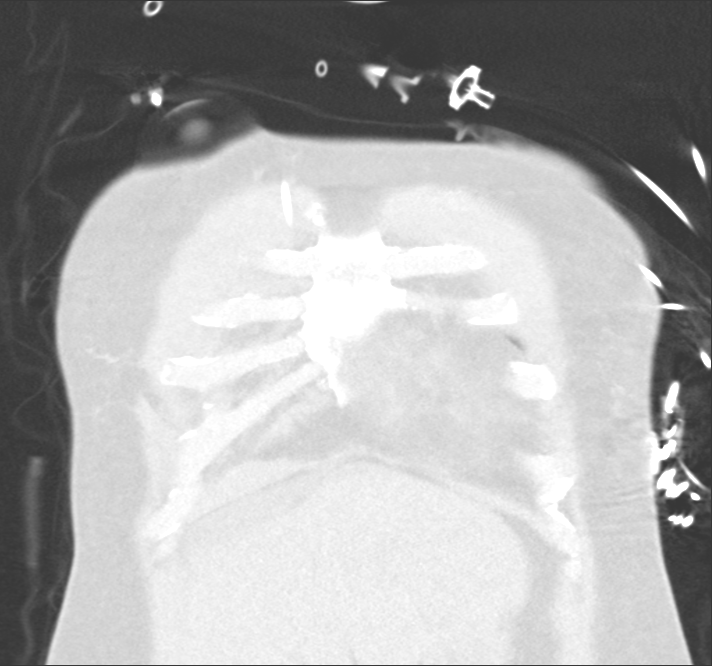
[im 40/100  lung]
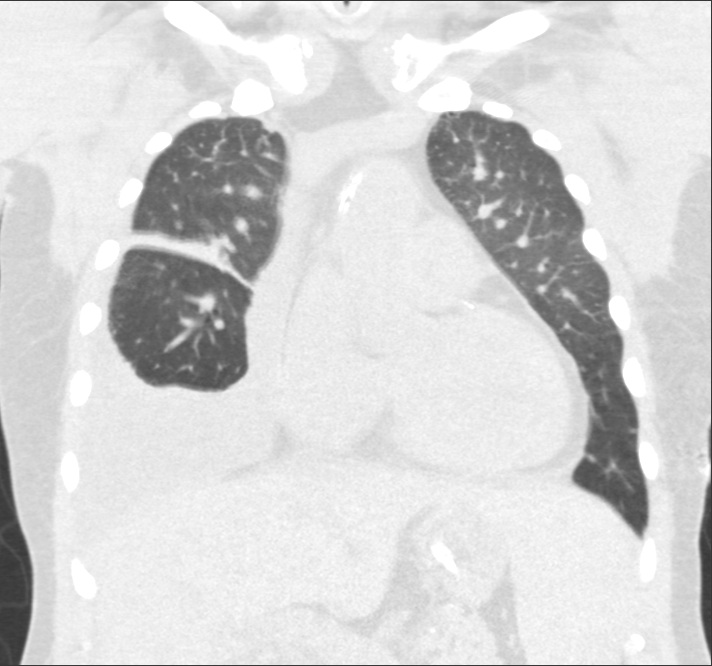
[im 60/100  lung]
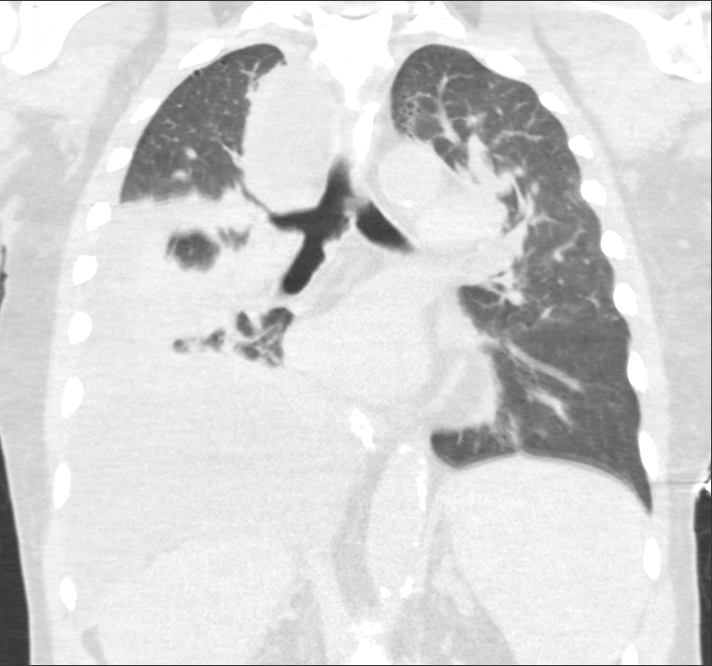

[13 of 36 positions shown; findings below may reference images not displayed]

FINDINGS: Intubated. Endotracheal tube tip above the carina. Enteric tube in
place and courses to the mid portion of the stomach.

Loculated right pleural effusion is slightly smaller than in
[REDACTED], moderate to large. Component along the right mediastinal
contour has increased (series 21, image 13 today versus series 2,
image [REDACTED]). Small layering left pleural effusion. No
pericardial effusion.

No mediastinal hematoma. Extensive calcified atherosclerosis of the
aorta. No mediastinal or hilar lymphadenopathy evident in the
absence of contrast. No axillary lymphadenopathy.

Air bronchograms in the superior segment right lower lobe and
inferior right upper lobe. Compressive right greater than left
pulmonary atelectasis elsewhere. Small volume retained secretions in
the mainstem bronchi. No pneumothorax.

No sternal fracture. No right rib fracture identified. No left rib
fracture identified. Partially visible subcutaneous catheter tubing
along the right anterior chest, the more medial tube appears to
enter the peritoneal cavity as before. No acute osseous abnormality
identified.

Stable visualized noncontrast upper abdominal viscera.
IMPRESSION: 1. Endotracheal tube tip above the carina. Enteric tube course to
the mid stomach.
2. Chronic loculated right pleural effusion is mildly regressed
since [REDACTED]. New small layering left pleural effusion. No
pericardial effusion.
3. Aspiration versus pneumonia in the superior segment right lower
lobe. Compressive pulmonary atelectasis elsewhere.
4. No sternal or rib fractures identified.

## 2015-11-18 ENCOUNTER — Emergency Department (HOSPITAL_COMMUNITY): Payer: Medicare Other

## 2015-11-18 ENCOUNTER — Encounter (HOSPITAL_COMMUNITY): Payer: Self-pay | Admitting: Emergency Medicine

## 2015-11-18 ENCOUNTER — Inpatient Hospital Stay (HOSPITAL_COMMUNITY)
Admission: EM | Admit: 2015-11-18 | Discharge: 2015-11-22 | DRG: 291 | Disposition: A | Discharge status: Home / Self Care | Payer: Medicare Other | Attending: Internal Medicine | Admitting: Internal Medicine

## 2015-11-18 DIAGNOSIS — J9601 Acute respiratory failure with hypoxia: Secondary | ICD-10-CM | POA: Diagnosis present

## 2015-11-18 DIAGNOSIS — J449 Chronic obstructive pulmonary disease, unspecified: Secondary | ICD-10-CM | POA: Diagnosis present

## 2015-11-18 DIAGNOSIS — I5033 Acute on chronic diastolic (congestive) heart failure: Secondary | ICD-10-CM | POA: Diagnosis present

## 2015-11-18 DIAGNOSIS — I480 Paroxysmal atrial fibrillation: Secondary | ICD-10-CM | POA: Diagnosis present

## 2015-11-18 DIAGNOSIS — Z992 Dependence on renal dialysis: Secondary | ICD-10-CM

## 2015-11-18 DIAGNOSIS — I4891 Unspecified atrial fibrillation: Secondary | ICD-10-CM | POA: Diagnosis not present

## 2015-11-18 DIAGNOSIS — D649 Anemia, unspecified: Secondary | ICD-10-CM | POA: Diagnosis present

## 2015-11-18 DIAGNOSIS — Z7982 Long term (current) use of aspirin: Secondary | ICD-10-CM

## 2015-11-18 DIAGNOSIS — M109 Gout, unspecified: Secondary | ICD-10-CM | POA: Diagnosis present

## 2015-11-18 DIAGNOSIS — K2990 Gastroduodenitis, unspecified, without bleeding: Secondary | ICD-10-CM | POA: Diagnosis present

## 2015-11-18 DIAGNOSIS — I132 Hypertensive heart and chronic kidney disease with heart failure and with stage 5 chronic kidney disease, or end stage renal disease: Principal | ICD-10-CM | POA: Diagnosis present

## 2015-11-18 DIAGNOSIS — F17210 Nicotine dependence, cigarettes, uncomplicated: Secondary | ICD-10-CM | POA: Diagnosis present

## 2015-11-18 DIAGNOSIS — N2581 Secondary hyperparathyroidism of renal origin: Secondary | ICD-10-CM | POA: Diagnosis present

## 2015-11-18 DIAGNOSIS — Z09 Encounter for follow-up examination after completed treatment for conditions other than malignant neoplasm: Secondary | ICD-10-CM

## 2015-11-18 DIAGNOSIS — R05 Cough: Secondary | ICD-10-CM | POA: Diagnosis present

## 2015-11-18 DIAGNOSIS — I5032 Chronic diastolic (congestive) heart failure: Secondary | ICD-10-CM | POA: Diagnosis present

## 2015-11-18 DIAGNOSIS — N189 Chronic kidney disease, unspecified: Secondary | ICD-10-CM | POA: Diagnosis not present

## 2015-11-18 DIAGNOSIS — H548 Legal blindness, as defined in USA: Secondary | ICD-10-CM | POA: Diagnosis present

## 2015-11-18 DIAGNOSIS — E1122 Type 2 diabetes mellitus with diabetic chronic kidney disease: Secondary | ICD-10-CM | POA: Diagnosis present

## 2015-11-18 DIAGNOSIS — Z89022 Acquired absence of left finger(s): Secondary | ICD-10-CM

## 2015-11-18 DIAGNOSIS — J189 Pneumonia, unspecified organism: Secondary | ICD-10-CM | POA: Diagnosis present

## 2015-11-18 DIAGNOSIS — G9349 Other encephalopathy: Secondary | ICD-10-CM | POA: Diagnosis present

## 2015-11-18 DIAGNOSIS — I10 Essential (primary) hypertension: Secondary | ICD-10-CM

## 2015-11-18 DIAGNOSIS — K5791 Diverticulosis of intestine, part unspecified, without perforation or abscess with bleeding: Secondary | ICD-10-CM | POA: Diagnosis present

## 2015-11-18 DIAGNOSIS — R195 Other fecal abnormalities: Secondary | ICD-10-CM | POA: Diagnosis present

## 2015-11-18 DIAGNOSIS — N186 End stage renal disease: Secondary | ICD-10-CM | POA: Diagnosis present

## 2015-11-18 DIAGNOSIS — D631 Anemia in chronic kidney disease: Secondary | ICD-10-CM | POA: Diagnosis present

## 2015-11-18 DIAGNOSIS — G9340 Encephalopathy, unspecified: Secondary | ICD-10-CM | POA: Diagnosis not present

## 2015-11-18 DIAGNOSIS — Z982 Presence of cerebrospinal fluid drainage device: Secondary | ICD-10-CM

## 2015-11-18 DIAGNOSIS — J440 Chronic obstructive pulmonary disease with acute lower respiratory infection: Secondary | ICD-10-CM | POA: Diagnosis present

## 2015-11-18 DIAGNOSIS — D62 Acute posthemorrhagic anemia: Secondary | ICD-10-CM | POA: Diagnosis present

## 2015-11-18 DIAGNOSIS — R059 Cough, unspecified: Secondary | ICD-10-CM

## 2015-11-18 DIAGNOSIS — J90 Pleural effusion, not elsewhere classified: Secondary | ICD-10-CM | POA: Diagnosis present

## 2015-11-18 DIAGNOSIS — D696 Thrombocytopenia, unspecified: Secondary | ICD-10-CM | POA: Diagnosis present

## 2015-11-18 LAB — URINALYSIS, ROUTINE W REFLEX MICROSCOPIC
Bilirubin Urine: NEGATIVE
Glucose, UA: NEGATIVE mg/dL
Ketones, ur: NEGATIVE mg/dL
Nitrite: NEGATIVE
Protein, ur: 100 mg/dL — AB
SPECIFIC GRAVITY, URINE: 1.011 (ref 1.005–1.030)
pH: 8.5 — ABNORMAL HIGH (ref 5.0–8.0)

## 2015-11-18 LAB — COMPREHENSIVE METABOLIC PANEL
ALT: 14 U/L — ABNORMAL LOW (ref 17–63)
AST: 19 U/L (ref 15–41)
Albumin: 3 g/dL — ABNORMAL LOW (ref 3.5–5.0)
Alkaline Phosphatase: 53 U/L (ref 38–126)
Anion gap: 9 (ref 5–15)
BILIRUBIN TOTAL: 0.6 mg/dL (ref 0.3–1.2)
BUN: 11 mg/dL (ref 6–20)
CO2: 34 mmol/L — ABNORMAL HIGH (ref 22–32)
Calcium: 9.2 mg/dL (ref 8.9–10.3)
Chloride: 98 mmol/L — ABNORMAL LOW (ref 101–111)
Creatinine, Ser: 4.38 mg/dL — ABNORMAL HIGH (ref 0.61–1.24)
GFR calc Af Amer: 15 mL/min — ABNORMAL LOW (ref >60)
GFR, EST NON AFRICAN AMERICAN: 13 mL/min — AB (ref >60)
Glucose, Bld: 100 mg/dL — ABNORMAL HIGH (ref 65–99)
POTASSIUM: 3.4 mmol/L — AB (ref 3.5–5.1)
Sodium: 141 mmol/L (ref 135–145)
TOTAL PROTEIN: 7.3 g/dL (ref 6.5–8.1)

## 2015-11-18 LAB — I-STAT CG4 LACTIC ACID, ED: LACTIC ACID, VENOUS: 1.03 mmol/L (ref 0.5–2.0)

## 2015-11-18 LAB — CBC
HEMATOCRIT: 23 % — AB (ref 39.0–52.0)
HEMATOCRIT: 31.9 % — AB (ref 39.0–52.0)
Hemoglobin: 6.9 g/dL — CL (ref 13.0–17.0)
Hemoglobin: 10.3 g/dL — ABNORMAL LOW (ref 13.0–17.0)
MCH: 30.3 pg (ref 26.0–34.0)
MCH: 30.4 pg (ref 26.0–34.0)
MCHC: 30 g/dL (ref 30.0–36.0)
MCHC: 32.3 g/dL (ref 30.0–36.0)
MCV: 100.9 fL — ABNORMAL HIGH (ref 78.0–100.0)
MCV: 94.7 fL (ref 78.0–100.0)
PLATELETS: 95 10*3/uL — AB (ref 150–400)
Platelets: 96 10*3/uL — ABNORMAL LOW (ref 150–400)
RBC: 2.28 MIL/uL — ABNORMAL LOW (ref 4.22–5.81)
RBC: 3.39 MIL/uL — ABNORMAL LOW (ref 4.22–5.81)
RDW: 17 % — AB (ref 11.5–15.5)
RDW: 18.4 % — AB (ref 11.5–15.5)
WBC: 4.1 10*3/uL (ref 4.0–10.5)
WBC: 5.6 10*3/uL (ref 4.0–10.5)

## 2015-11-18 LAB — BRAIN NATRIURETIC PEPTIDE: B NATRIURETIC PEPTIDE 5: 2264.3 pg/mL — AB (ref 0.0–100.0)

## 2015-11-18 LAB — IRON AND TIBC
IRON: 40 ug/dL — AB (ref 45–182)
Saturation Ratios: 28 % (ref 17.9–39.5)
TIBC: 141 ug/dL — ABNORMAL LOW (ref 250–450)
UIBC: 101 ug/dL

## 2015-11-18 LAB — TSH: TSH: 1.147 u[IU]/mL (ref 0.350–4.500)

## 2015-11-18 LAB — FOLATE: FOLATE: 11.3 ng/mL (ref >5.9)

## 2015-11-18 LAB — RETICULOCYTES
RBC.: 2.29 MIL/uL — AB (ref 4.22–5.81)
RETIC COUNT ABSOLUTE: 66.4 10*3/uL (ref 19.0–186.0)
Retic Ct Pct: 2.9 % (ref 0.4–3.1)

## 2015-11-18 LAB — URINE MICROSCOPIC-ADD ON

## 2015-11-18 LAB — VITAMIN B12: VITAMIN B 12: 397 pg/mL (ref 180–914)

## 2015-11-18 LAB — I-STAT TROPONIN, ED: TROPONIN I, POC: 0.02 ng/mL (ref 0.00–0.08)

## 2015-11-18 LAB — PREPARE RBC (CROSSMATCH)

## 2015-11-18 LAB — POC OCCULT BLOOD, ED: Fecal Occult Bld: POSITIVE — AB

## 2015-11-18 LAB — CBG MONITORING, ED: Glucose-Capillary: 88 mg/dL (ref 65–99)

## 2015-11-18 LAB — FERRITIN: Ferritin: 1185 ng/mL — ABNORMAL HIGH (ref 24–336)

## 2015-11-18 MED ORDER — DILTIAZEM HCL ER COATED BEADS 120 MG PO CP24
120.0000 mg | ORAL_CAPSULE | Freq: Every day | ORAL | Status: DC
  Administered 2015-11-18: 120 mg via ORAL
  Filled 2015-11-18: qty 1

## 2015-11-18 MED ORDER — PENTAFLUOROPROP-TETRAFLUOROETH EX AERO: 1.0000 "application " | INHALATION_SPRAY | CUTANEOUS | Status: DC | PRN

## 2015-11-18 MED ORDER — ALTEPLASE 2 MG IJ SOLR
2.0000 mg | Freq: Once | INTRAMUSCULAR | Status: DC | PRN
  Filled 2015-11-18: qty 2

## 2015-11-18 MED ORDER — ALLOPURINOL 100 MG PO TABS
100.0000 mg | ORAL_TABLET | Freq: Every day | ORAL | Status: DC
Start: 2015-11-18 — End: 2015-11-22
  Administered 2015-11-18 – 2015-11-22 (×5): 100 mg via ORAL
  Filled 2015-11-18 (×5): qty 1

## 2015-11-18 MED ORDER — HYDROCODONE-ACETAMINOPHEN 5-325 MG PO TABS: 1.0000 | ORAL_TABLET | ORAL | Status: DC | PRN

## 2015-11-18 MED ORDER — DOXERCALCIFEROL 4 MCG/2ML IV SOLN
1.0000 ug | INTRAVENOUS | Status: DC
  Administered 2015-11-19 – 2015-11-22 (×2): 1 ug via INTRAVENOUS
  Filled 2015-11-18: qty 2

## 2015-11-18 MED ORDER — ALBUTEROL SULFATE HFA 108 (90 BASE) MCG/ACT IN AERS: 1.0000 | INHALATION_SPRAY | Freq: Four times a day (QID) | RESPIRATORY_TRACT | Status: DC | PRN

## 2015-11-18 MED ORDER — ONDANSETRON HCL 4 MG/2ML IJ SOLN: 4.0000 mg | Freq: Four times a day (QID) | INTRAMUSCULAR | Status: DC | PRN

## 2015-11-18 MED ORDER — LIDOCAINE HCL (PF) 1 % IJ SOLN
5.0000 mL | INTRAMUSCULAR | Status: DC | PRN
Start: 2015-11-18 — End: 2015-11-18

## 2015-11-18 MED ORDER — DIPHENHYDRAMINE HCL 25 MG PO CAPS: 25.0000 mg | ORAL_CAPSULE | Freq: Four times a day (QID) | ORAL | Status: DC | PRN

## 2015-11-18 MED ORDER — DARBEPOETIN ALFA 150 MCG/0.3ML IJ SOSY
150.0000 ug | PREFILLED_SYRINGE | INTRAMUSCULAR | Status: DC
  Administered 2015-11-19: 150 ug via INTRAVENOUS

## 2015-11-18 MED ORDER — SODIUM CHLORIDE 0.9 % IV SOLN
80.0000 mg | Freq: Once | INTRAVENOUS | Status: AC
  Administered 2015-11-18: 80 mg via INTRAVENOUS
  Filled 2015-11-18: qty 80

## 2015-11-18 MED ORDER — SODIUM CHLORIDE 0.9 % IV SOLN: 100.0000 mL | INTRAVENOUS | Status: DC | PRN

## 2015-11-18 MED ORDER — CARVEDILOL 12.5 MG PO TABS
12.5000 mg | ORAL_TABLET | Freq: Two times a day (BID) | ORAL | Status: DC
  Administered 2015-11-18 – 2015-11-21 (×6): 12.5 mg via ORAL
  Filled 2015-11-18 (×8): qty 1

## 2015-11-18 MED ORDER — SODIUM CHLORIDE 0.9 % IV SOLN
10.0000 mL/h | Freq: Once | INTRAVENOUS | Status: AC
  Administered 2015-11-18: 10 mL/h via INTRAVENOUS

## 2015-11-18 MED ORDER — ACETAMINOPHEN 325 MG PO TABS: 650.0000 mg | ORAL_TABLET | Freq: Four times a day (QID) | ORAL | Status: DC | PRN

## 2015-11-18 MED ORDER — ALBUTEROL SULFATE (2.5 MG/3ML) 0.083% IN NEBU: 3.0000 mL | INHALATION_SOLUTION | Freq: Four times a day (QID) | RESPIRATORY_TRACT | Status: DC | PRN

## 2015-11-18 MED ORDER — SODIUM CHLORIDE 0.9 % IV SOLN: Freq: Once | INTRAVENOUS | Status: DC

## 2015-11-18 MED ORDER — GUAIFENESIN ER 600 MG PO TB12
600.0000 mg | ORAL_TABLET | Freq: Two times a day (BID) | ORAL | Status: DC | PRN
  Filled 2015-11-18: qty 1

## 2015-11-18 MED ORDER — TIOTROPIUM BROMIDE MONOHYDRATE 18 MCG IN CAPS
18.0000 ug | ORAL_CAPSULE | Freq: Every morning | RESPIRATORY_TRACT | Status: DC
  Administered 2015-11-22: 18 ug via RESPIRATORY_TRACT
  Filled 2015-11-18: qty 5

## 2015-11-18 MED ORDER — PANTOPRAZOLE SODIUM 40 MG IV SOLR
40.0000 mg | Freq: Two times a day (BID) | INTRAVENOUS | Status: DC
  Administered 2015-11-18: 40 mg via INTRAVENOUS
  Filled 2015-11-18: qty 40

## 2015-11-18 MED ORDER — SODIUM CHLORIDE 0.9 % IV SOLN
125.0000 mg | INTRAVENOUS | Status: DC
  Administered 2015-11-19 – 2015-11-22 (×2): 125 mg via INTRAVENOUS
  Filled 2015-11-18 (×3): qty 10

## 2015-11-18 MED ORDER — ONDANSETRON HCL 4 MG PO TABS: 4.0000 mg | ORAL_TABLET | Freq: Four times a day (QID) | ORAL | Status: DC | PRN

## 2015-11-18 MED ORDER — ACETAMINOPHEN 650 MG RE SUPP: 650.0000 mg | Freq: Four times a day (QID) | RECTAL | Status: DC | PRN

## 2015-11-18 MED ORDER — LIDOCAINE-PRILOCAINE 2.5-2.5 % EX CREA
1.0000 "application " | TOPICAL_CREAM | CUTANEOUS | Status: DC | PRN
  Filled 2015-11-18: qty 5

## 2015-11-18 MED ORDER — HEPARIN SODIUM (PORCINE) 1000 UNIT/ML DIALYSIS: 1000.0000 [IU] | INTRAMUSCULAR | Status: DC | PRN

## 2015-11-18 MED ORDER — ASPIRIN 81 MG PO CHEW
81.0000 mg | CHEWABLE_TABLET | Freq: Every day | ORAL | Status: DC
  Administered 2015-11-18 – 2015-11-22 (×5): 81 mg via ORAL
  Filled 2015-11-18 (×5): qty 1

## 2015-11-18 NOTE — ED Notes (Signed)
Report given to dialysis RN - spot available for pt. Pt has admission orders so okay for pt to be transported to dialysis and will go to floor from there.

## 2015-11-18 NOTE — ED Notes (Signed)
ESRD present with MWF schedule.

## 2015-11-18 NOTE — ED Notes (Signed)
Wife reports that pt had dialysis yesterday and was able to sit for full treatment.

## 2015-11-18 NOTE — H&P (Signed)
[facility] History and Physical  Cameron Mendez [id number] DOB: 20-Oct-1946 DOA: 11/18/2015  Referring physician: Emergency Department PCP: [name], MD   CHIEF COMPLAINT:  Nonproductive cough, altered mental status    HPI: Cameron Mendez is a 69 y.o. male with multiple medical problems including end-stage renal disease on hemodialysis Monday, Wednesday, Friday. Patient brought in this a.m. by EMS. Per wife, patient has been confused at home. He has had a nonproductive cough for one week and becoming progressively weaker. Appetite is suboptima. No overt GI bleeding. No nausea, vomiting or bowel changes.      ED COURSE:    Labs:   WBC 4.1, hgb 6.9 (baseline 8-10), down from 10.1 mid October, platelets 95 Sodium 141,  potassium 3.4.  BNP 2264 Trop 0.02 Lactic acid 1.03  Urinalysis:    Cloudy, few bacteria, small leukocytes, wbcs 6-30  CXR: Right-sided pleural effusion which may be edema . Cannot exclude right lower infiltrate or mass  EKG:    Sinus rhythm Atrial premature complex Repol abnrm suggests ischemia, diffuse leads No significant change since last tracing Confirmed by NGUYEN, EMILY ([id number]) on 11/18/2015 7:38:32 AM QT 483   Medications  pantoprazole (PROTONIX) 80 mg in sodium chloride 0.9 % 100 mL IVPB (not administered)  0.9 %  sodium chloride infusion (not administered)    Review of Systems  Unable to perform ROS: acuity of condition   Past Medical History  Diagnosis Date  . Anemia   . Diastolic dysfunction   . Retinitis pigmentosa     Blindness--has shunt --placed yrs ago in [place]..  . Arthritis     Gout  . Hypertension     dx--"long time"  . CKD (chronic kidney disease), stage IV ([place])     a. L upper extremity AV fistula created 07/2012.  [name] BPH (benign prostatic hyperplasia)   . Blind   . Gangrene of finger ([place])     a. 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  . Colon polyps   . Pericardial effusion     a. Noted on echo  10/2009. b. Again seen on echo 09/2013.  [name] CHF (congestive heart failure) ([place])   . COPD (chronic obstructive pulmonary disease) ([place])   . GI (gastrointestinal bleed) feb 2016  . Irregular heart rate 03/03/2015  . Cardiac arrest [facility])    Past Surgical History  Procedure Laterality Date  . Cataract extraction w/ intraocular lens  implant, bilateral Bilateral   . Knee ligament reconstruction Left   . Av fistula placement  07/15/2012    Procedure: ARTERIOVENOUS (AV) FISTULA CREATION;  Surgeon: Rosetta Posner, MD;  Location: [place];  Service: Vascular;  Laterality: Left;  . Amputation  12/30/2012    Procedure: AMPUTATION DIGIT;  Surgeon: Tennis Must, MD;  Location: [place];  Service: Orthopedics;  Laterality: Left;  LEFT SMALL FINGER AMPUTATION  . Pericardiocentesis  09/2013  . Pericardial tap N/A 09/19/2013    Procedure: PERICARDIAL TAP;  Surgeon: Blane Ohara, MD;  Location[facility] C[facility] LAB;  Service: Cardiovascular;  Laterality: N/A;    SOCIAL HISTORY:  reports that he has been smoking Cigarettes.  He has a 25 pack-year smoking history. He has never used smokeless tobacco. He reports that he does not drink alcohol or use illicit drugs. Lives:   At home with wife      Assistive devices:   Cane None needed for ambulation.   Allergies  Allergen Reactions  . Ibuprofen Other (See Comments)  Kidney problems-dialysis patient  . Nsaids Other (See Comments)    Kidney problems-dialysis patient    Family History  Problem Relation Age of Onset  . Ovarian cancer Mother   . Kidney disease Neg Hx   . Gallbladder disease Neg Hx   . Esophageal cancer Neg Hx   . Heart disease Neg Hx   . Stomach cancer Neg Hx   . Colon polyps Brother   . Colon cancer Brother     Prior to Admission medications   Medication Sig Start Date End Date Taking? Authorizing Provider  allopurinol (ZYLOPRIM) 100 MG tablet Take 100 mg by mouth daily.    Yes Historical Provider, MD  aspirin 81 MG tablet  Take 81 mg by mouth daily.   Yes Historical Provider, MD  carvedilol (COREG) 12.5 MG tablet Take 1 tablet (12.5 mg total) by mouth 2 (two) times daily with a meal. 09/21/15  Yes Bonnielee Haff, MD  diltiazem (CARDIZEM CD) 120 MG 24 hr capsule Take 1 capsule (120 mg total) by mouth daily. 10/01/15  Yes Dixie Dials, MD  doxercalciferol (HECTOROL) 4 MCG/2ML injection Inject 0.5 mLs (1 mcg total) into the vein every Monday, Wednesday, and Friday with hemodialysis. 08/24/14  Yes Mechele Claude, DO  ferric gluconate 125 mg in sodium chloride 0.9 % 100 mL Inject 125 mg into the vein every Monday, Wednesday, and Friday with hemodialysis. 08/24/14  Yes Jessica Upchurch Vann, DO  guaiFENesin (MUCINEX) 600 MG 12 hr tablet Take 1 tablet (600 mg total) by mouth 2 (two) times daily as needed (congestion). 03/04/15  Yes Dixie Dials, MD  Multiple Vitamin (MULTIVITAMIN WITH MINERALS) TABS tablet Take 1 tablet by mouth daily.   Yes Historical Provider, MD  SPIRIVA RESPIMAT 2.5 MCG/ACT AERS INHALE 2 PUFFS INTO THE LUNGS EVERY MORNING 08/03/15  Yes [name], MD  acetaminophen (TYLENOL) 500 MG tablet Take 1,000 mg by mouth every 6 (six) hours as needed for moderate pain.    Historical Provider, MD  albuterol (PROVENTIL HFA;VENTOLIN HFA) 108 (90 BASE) MCG/ACT inhaler Inhale 1-2 puffs into the lungs every 6 (six) hours as needed for wheezing or shortness of breath.    Historical Provider, MD  diphenhydrAMINE (BENADRYL) 25 MG tablet Take 25 mg by mouth every 6 (six) hours as needed for itching or allergies (congestion).     Historical Provider, MD  metroNIDAZOLE (FLAGYL) 500 MG tablet Take 1 tablet (500 mg total) by mouth every 8 (eight) hours. For 11 more days Patient not taking: Reported on 11/18/2015 09/21/15   Bonnielee Haff, MD  saccharomyces boulardii (FLORASTOR) 250 MG capsule Take 1 capsule (250 mg total) by mouth 2 (two) times daily. For 15 days Patient not taking: Reported on 11/18/2015 09/21/15   Bonnielee Haff, MD   PHYSICAL EXAM: Filed Vitals:   11/18/15 0745 11/18/15 0755 11/18/15 0800 11/18/15 0845  BP: 148/48  146/51 143/50  Pulse: 74  70 74  Temp:  98.8 F (37.1 C)    TempSrc:  Rectal    Resp: 18  13 19   Height:      SpO2: 96%  99% 95%    Wt Readings from Last 3 Encounters:  10/01/15 63.1 kg (139 lb 1.8 oz)  09/21/15 58.5 kg (128 lb 15.5 oz)  03/04/15 58.968 kg (130 lb)    General:  Pleasant black male. Appears calm and comfortable Eyes: PER, normal lids, irises & conjunctiva ENT: grossly normal hearing, lips. Tongue slightly dry Neck: no LAD, no masses. Left neck  mass, probably lipoma Cardiovascular: RRR, no murmurs. No LE edema.  Respiratory: Respirations even and unlabored. Normal respiratory effort. Lungs CTA bilaterally, no wheezes / rales .   Abdomen: soft, non-distended, non-tender, active bowel sounds. No obvious masses.  Skin: no rash seen on limited exam Musculoskeletal: grossly normal tone BUE/BLE Psychiatric: grossly normal mood and affect, speech fluent and appropriate Neurologic: grossly non-focal.         LABS ON ADMISSION:    Basic Metabolic Panel:  Recent Labs Lab 11/18/15 0639  NA 141  K 3.4*  CL 98*  CO2 34*  GLUCOSE 100*  BUN 11  CREATININE 4.38*  CALCIUM 9.2   Liver Function Tests:  Recent Labs Lab 11/18/15 0639  AST 19  ALT 14*  ALKPHOS 53  BILITOT 0.6  PROT 7.3  ALBUMIN 3.0*   CBC:  Recent Labs Lab 11/18/15 0639  WBC 4.1  HGB 6.9*  HCT 23.0*  MCV 100.9*  PLT 95*    BNP (last 3 results)  Recent Labs  09/13/15 0657 09/28/15 1842 11/18/15 0639  BNP 1766.2* 764.8* 2264.3*    CBG:  Recent Labs Lab 11/18/15 0732  GLUCAP 88    Creatinine clearance cannot be calculated (Unknown ideal weight.)  Radiological Exams on Admission: Dg Chest 2 View  11/18/2015  CLINICAL DATA:  69 year old hypertensive male with cough and altered mental status. Initial encounter. EXAM: CHEST  2 VIEW COMPARISON:  09/28/2015  chest x-ray and 09/13/2015 chest CT. FINDINGS: Cardiomegaly. Pulmonary vascular congestion Calcified mildly tortuous aorta. Right sided pleural effusion may reflect result of pulmonary edema with adjacent atelectasis. By plain film, cannot exclude right lower lobe infiltrate or mass. Follow-up until clearance recommended. Ventriculoperitoneal shunt courses over the right thorax. IMPRESSION: Cardiomegaly. Pulmonary vascular congestion Right sided pleural effusion may reflect result of pulmonary edema with adjacent atelectasis. By plain film, cannot exclude right lower lobe infiltrate or mass. Follow-up until clearance recommended. Electronically Signed   By: [name]   On: 11/18/2015 07:28   Ct Head Wo Contrast  11/18/2015  CLINICAL DATA:  69 year old male with altered mental status. Hemodialysis. Legally blind. Initial encounter. EXAM: CT HEAD WITHOUT CONTRAST TECHNIQUE: Contiguous axial images were obtained from the base of the skull through the vertex without intravenous contrast. COMPARISON:  09/13/2015 head CT. FINDINGS: Shunt within right lateral ventricle unchanged in position without development of hydrocephalus or slit like appearance of the ventricles. Global atrophy. No acute intracranial hemorrhage. Remote infarcts posterior left frontal lobe, corona radiata bilaterally, left caudate head and right lenticular nucleus. Prominent white matter type changes consistent with result of small vessel disease. No CT evidence of large acute infarct although with the degree of white matter disease excluding small or medium size infarct difficult by CT. 6 mm right frontal calcified meningioma without associated mass effect stable in appearance. Vascular calcifications. Post lens replacement calcified on the left. Prominent mucosal thickening maxillary sinuses bilaterally and left sphenoid sinus. Opacification ethmoid sinus air cells bilaterally. IMPRESSION: Shunt within right lateral ventricle without  hydrocephalus. Global atrophy. No acute intracranial hemorrhage. Remote infarcts posterior left frontal lobe, corona radiata bilaterally, left caudate head and right lenticular nucleus. Prominent small vessel disease. No CT evidence of large acute infarct. 6 mm right frontal calcified meningioma without associated mass effect stable in appearance. Vascular calcifications. Prominent mucosal thickening maxillary sinuses bilaterally and left sphenoid sinus. Opacification ethmoid sinus air cells bilaterally. Electronically Signed   By: [name]   On: 11/18/2015 08:30  ASSESSMENT / PLAN    Acute on chronic macrocytic anemia / heme positive brown stool. Hemoglobin down from 10 to 6.9. No overt bleeding at home. Workup in March of this year for anemia and Hemoccult-positive stools. He had a colonoscopy with findings of diverticulosis, some adenomatous polyps were removed. Upper endoscopy done with findings of gastritis/duodenitis. Peptic inflammation biopsies , no evidence for celiac. I don't see that capsule endo was ordered. Per wife, patient takes a daily baby aspirin, no other NSAIDs. He is not on a PPI.  -Admit to renal floor. -No need for PPI drip, BID sufficient -obtain TIBC, Ferritin to further evaluate anemia. Given macrocytosis, will also obtain B12, folate -TSH.  -Agree with blood transfusion. Follow up am CBC -EDP to notify Nephrology about patient's admission -I don't see Epogen on home med list but getting IV iron at HD.   -Per home med list, patient getting IV iron on Monday, Wednesday and Friday at dialysis.    Confusion / weakness. May be related to acute illness. Head CT negative. With the exception of hemoglobin, labs are about at baseline.. Will ask physical therapy to evaluate and treat  Nonproductive cough x 4 days. Right-sided pleural effusion which could be edema seen on chest x-ray. Right lower lobe infiltrate / mass not excluded. Patient dialyzed yesterday.  -Repeat  chest x-ray after dialysis tomorrow.   Acute encephalopathy. Confusion at home per wife.  If repeat CXR tomorrow suggests PNA then should start antibiotics for HAP.   COPD. No significant shortness of breath. -Continue home inhalers  End-stage renal disease on hemodialysis M,W, F. EDP to notify nephrology of patients pending admission  Atrial fibrillation, currently in sinus rhythm .  [id number] score of 3/9 -continue home regimen  Essential hypertension. Stable.  -Continue home anti-hypertensives  Chronic diastolic CHF. Vascular congestion/right pleural effusion on chest x-ray.  -Patient dialyzed yesterday but he is getting blood transfusion which will lead to his volume. -Continue Coreg  Legal blindness      CONSULTANTS:   Nephrology  Code Status:  full code DVT Prophylaxis: SCD Family Communication: Wife at bedside and understand and agrees with plan of treatment   Disposition Plan: Discharge to home in in 2-3 days   Time spent: 60 minutes Tye Savoy  NP [facility] Pager [phone]

## 2015-11-18 NOTE — ED Notes (Signed)
Patient here via EMS from home with complaint of non-productive cough x4 days. Family reports subjective fever, but hasn't been measured. Additionally family reports patient exhibited some confusion at home over the past few days. Patient is blind.

## 2015-11-18 NOTE — ED Provider Notes (Signed)
CSN: [id number]     Arrival date & time 11/18/15  0607 History   None    Chief Complaint  Patient presents with  . Cough  . Altered Mental Status     (Consider location/radiation/quality/duration/timing/severity/associated sxs/prior Treatment) HPI   Cameron Mendez is a 69 year-old AA male with ESRD on dialysis, anemia, thrombocytopenia, COPD, dHF, history of pleural effusions, he reports to the ER today, via EMS, with his wife for complaints of altered mental status, weakness and cough over the past 4-5 days. His wife states that he has been compliant with his Monday and Wednesday dialysis.  He has been more sleepy than usual, and has primarily slept in his recliner.  At his baseline (legally blind) he mostly stays at home and can get around with his cane without difficulty, and he will get himself food and drinks, and listen to music.  Over the past several days his wife reports he has just slept in his recliner.  He has been eating and drinking okay when she brings it to him.  He also has been able to get up to go to dialysis without much difficulty. He has had a cough which his wife describes as a dry, persistent nonproductive cough.  She states it is not seem like his COPD exacerbations.  She denies any sick contacts but states that he recently had pneumonia and was in the hospital a little less than 2 months ago.  He has become progressively confused, such as thinking the closet is the bathroom. He currently denies pain, dyspnea, chest pain, abdominal pain. He has not had nausea, vomiting, diarrhea.  Wife states he makes a small amount of urine.  No noticeable change to BM's.    Past Medical History  Diagnosis Date  . Anemia   . Diastolic dysfunction   . Retinitis pigmentosa     Blindness--has shunt --placed yrs ago in [place]..  . Arthritis     Gout  . Hypertension     dx--"long time"  . CKD (chronic kidney disease), stage IV ([place])     a. L upper extremity AV fistula created 07/2012.   [name] BPH (benign prostatic hyperplasia)   . Blind   . Gangrene of finger ([place])     a. 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  . Colon polyps   . Pericardial effusion     a. Noted on echo 10/2009. b. Again seen on echo 09/2013.  [name] CHF (congestive heart failure) ([place])   . COPD (chronic obstructive pulmonary disease) ([place])   . GI (gastrointestinal bleed) feb 2016  . Irregular heart rate 03/03/2015  . Cardiac arrest [facility])    Past Surgical History  Procedure Laterality Date  . Cataract extraction w/ intraocular lens  implant, bilateral Bilateral   . Knee ligament reconstruction Left   . Av fistula placement  07/15/2012    Procedure: ARTERIOVENOUS (AV) FISTULA CREATION;  Surgeon: Rosetta Posner, MD;  Location: [place];  Service: Vascular;  Laterality: Left;  . Amputation  12/30/2012    Procedure: AMPUTATION DIGIT;  Surgeon: Tennis Must, MD;  Location: [place];  Service: Orthopedics;  Laterality: Left;  LEFT SMALL FINGER AMPUTATION  . Pericardiocentesis  09/2013  . Pericardial tap N/A 09/19/2013    Procedure: PERICARDIAL TAP;  Surgeon: Blane Ohara, MD;  Location[facility] CATH LAB;  Service: Cardiovascular;  Laterality: N/A;   Family History  Problem Relation Age of Onset  . Ovarian cancer Mother   .  Kidney disease Neg Hx   . Gallbladder disease Neg Hx   . Esophageal cancer Neg Hx   . Heart disease Neg Hx   . Stomach cancer Neg Hx   . Colon polyps Brother   . Colon cancer Brother    Social History  Substance Use Topics  . Smoking status: Current Every Day Smoker -- 0.50 packs/day for 50 years    Types: Cigarettes  . Smokeless tobacco: Never Used     Comment: Pt given handout on how to quit smoking 12/31/14  . Alcohol Use: No     Comment: "quit driinking in ~ 2000"    Review of Systems  Unable to perform ROS: Mental status change      Allergies  Ibuprofen and Nsaids  Home Medications   Prior to Admission medications   Medication Sig Start Date End  Date Taking? Authorizing Provider  allopurinol (ZYLOPRIM) 100 MG tablet Take 100 mg by mouth daily.    Yes Historical Provider, MD  aspirin 81 MG tablet Take 81 mg by mouth daily.   Yes Historical Provider, MD  carvedilol (COREG) 12.5 MG tablet Take 1 tablet (12.5 mg total) by mouth 2 (two) times daily with a meal. 09/21/15  Yes Bonnielee Haff, MD  diltiazem (CARDIZEM CD) 120 MG 24 hr capsule Take 1 capsule (120 mg total) by mouth daily. 10/01/15  Yes Dixie Dials, MD  doxercalciferol (HECTOROL) 4 MCG/2ML injection Inject 0.5 mLs (1 mcg total) into the vein every Monday, Wednesday, and Friday with hemodialysis. 08/24/14  Yes Mechele Claude, DO  ferric gluconate 125 mg in sodium chloride 0.9 % 100 mL Inject 125 mg into the vein every Monday, Wednesday, and Friday with hemodialysis. 08/24/14  Yes Jessica Upchurch Vann, DO  guaiFENesin (MUCINEX) 600 MG 12 hr tablet Take 1 tablet (600 mg total) by mouth 2 (two) times daily as needed (congestion). 03/04/15  Yes Dixie Dials, MD  Multiple Vitamin (MULTIVITAMIN WITH MINERALS) TABS tablet Take 1 tablet by mouth daily.   Yes Historical Provider, MD  SPIRIVA RESPIMAT 2.5 MCG/ACT AERS INHALE 2 PUFFS INTO THE LUNGS EVERY MORNING 08/03/15  Yes [name], MD  acetaminophen (TYLENOL) 500 MG tablet Take 1,000 mg by mouth every 6 (six) hours as needed for moderate pain.    Historical Provider, MD  albuterol (PROVENTIL HFA;VENTOLIN HFA) 108 (90 BASE) MCG/ACT inhaler Inhale 1-2 puffs into the lungs every 6 (six) hours as needed for wheezing or shortness of breath.    Historical Provider, MD  diphenhydrAMINE (BENADRYL) 25 MG tablet Take 25 mg by mouth every 6 (six) hours as needed for itching or allergies (congestion).     Historical Provider, MD   BP 147/49 mmHg  Pulse 80  Temp(Src) 98.7 F (37.1 C) (Oral)  Resp 18  Ht 5\' 3"  (1.6 m)  SpO2 97% Physical Exam  Constitutional: Vital signs are normal. He appears well-developed and well-nourished.  Non-toxic  appearance. No distress.  Chronically ill appearing male, NAD  HENT:  Head: Normocephalic and atraumatic.  Nose: Nose normal.  Mouth/Throat: No oropharyngeal exudate.  Dry pale mucous membranes  Eyes: Conjunctivae and lids are normal. Right eye exhibits no discharge. Left eye exhibits no discharge. No scleral icterus.  Palpebral conjunctiva pale  Neck: Trachea normal, normal range of motion, full passive range of motion without pain and phonation normal. No JVD present. No tracheal deviation present. No thyromegaly present.  Cardiovascular: Normal rate, regular rhythm, normal heart sounds and intact distal pulses.  Exam reveals  no gallop and no friction rub.   No murmur heard. Few premature beat, otherwise RRR, Radial pulses symmetrical 2+, dorsal pedis and posterior tibialis pulses 1+ & symmetrical, no lower extremity edema  Pulmonary/Chest: Effort normal. No accessory muscle usage. No tachypnea. No respiratory distress. He has no wheezes. He has no rhonchi. He has rales. He exhibits no tenderness.  BS diminished at the left lower base, BS diminished RLL and RML field with rales, wheeze, no rhonchi, frequent cough, no dyspnea  Abdominal: Soft. Normal appearance and bowel sounds are normal. He exhibits no distension and no mass. There is no tenderness. There is no rebound and no guarding.  Musculoskeletal: Normal range of motion. He exhibits no edema or tenderness.  Lymphadenopathy:    He has no cervical adenopathy.  Neurological: He is alert. He has normal reflexes. He exhibits normal muscle tone.  Oriented to person Follows simple commands Answering questions with 1-2 word answers Moving all extremities Normal sensation to light touch in all extremities Gait not assessed  Skin: Skin is warm and dry. No rash noted. He is not diaphoretic. No cyanosis or erythema. No pallor.  Sore on left knee, ttp, scabbed over, no drainage  Psychiatric: He has a normal mood and affect. His behavior is  normal. Thought content normal. His speech is delayed.  Nursing note and vitals reviewed.   ED Course  Procedures (including critical care time) Labs Review Labs Reviewed  COMPREHENSIVE METABOLIC PANEL - Abnormal; Notable for the following:    Potassium 3.4 (*)    Chloride 98 (*)    CO2 34 (*)    Glucose, Bld 100 (*)    Creatinine, Ser 4.38 (*)    Albumin 3.0 (*)    ALT 14 (*)    GFR calc non Af Amer 13 (*)    GFR calc Af Amer 15 (*)    All other components within normal limits  CBC - Abnormal; Notable for the following:    RBC 2.28 (*)    Hemoglobin 6.9 (*)    HCT 23.0 (*)    MCV 100.9 (*)    RDW 17.0 (*)    Platelets 95 (*)    All other components within normal limits  URINALYSIS, ROUTINE W REFLEX MICROSCOPIC (NOT AT [facility]) - Abnormal; Notable for the following:    APPearance CLOUDY (*)    pH 8.5 (*)    Hgb urine dipstick SMALL (*)    Protein, ur 100 (*)    Leukocytes, UA SMALL (*)    All other components within normal limits  BRAIN NATRIURETIC PEPTIDE - Abnormal; Notable for the following:    B Natriuretic Peptide 2264.3 (*)    All other components within normal limits  URINE MICROSCOPIC-ADD ON - Abnormal; Notable for the following:    Squamous Epithelial / LPF 0-5 (*)    Bacteria, UA FEW (*)    All other components within normal limits  IRON AND TIBC - Abnormal; Notable for the following:    Iron 40 (*)    TIBC 141 (*)    All other components within normal limits  FERRITIN - Abnormal; Notable for the following:    Ferritin 1185 (*)    All other components within normal limits  RETICULOCYTES - Abnormal; Notable for the following:    RBC. 2.29 (*)    All other components within normal limits  POC OCCULT BLOOD, ED - Abnormal; Notable for the following:    Fecal Occult Bld POSITIVE (*)  All other components within normal limits  VITAMIN B12  FOLATE  CBG MONITORING, ED  I-STAT TROPOININ, ED  I-STAT CG4 LACTIC ACID, ED  I-STAT CG4 LACTIC ACID, ED  TYPE AND  SCREEN  PREPARE RBC (CROSSMATCH)    Imaging Review Dg Chest 2 View  11/18/2015  CLINICAL DATA:  69 year old hypertensive male with cough and altered mental status. Initial encounter. EXAM: CHEST  2 VIEW COMPARISON:  09/28/2015 chest x-ray and 09/13/2015 chest CT. FINDINGS: Cardiomegaly. Pulmonary vascular congestion Calcified mildly tortuous aorta. Right sided pleural effusion may reflect result of pulmonary edema with adjacent atelectasis. By plain film, cannot exclude right lower lobe infiltrate or mass. Follow-up until clearance recommended. Ventriculoperitoneal shunt courses over the right thorax. IMPRESSION: Cardiomegaly. Pulmonary vascular congestion Right sided pleural effusion may reflect result of pulmonary edema with adjacent atelectasis. By plain film, cannot exclude right lower lobe infiltrate or mass. Follow-up until clearance recommended. Electronically Signed   By: [name]   On: 11/18/2015 07:28   Ct Head Wo Contrast  11/18/2015  CLINICAL DATA:  69 year old male with altered mental status. Hemodialysis. Legally blind. Initial encounter. EXAM: CT HEAD WITHOUT CONTRAST TECHNIQUE: Contiguous axial images were obtained from the base of the skull through the vertex without intravenous contrast. COMPARISON:  09/13/2015 head CT. FINDINGS: Shunt within right lateral ventricle unchanged in position without development of hydrocephalus or slit like appearance of the ventricles. Global atrophy. No acute intracranial hemorrhage. Remote infarcts posterior left frontal lobe, corona radiata bilaterally, left caudate head and right lenticular nucleus. Prominent white matter type changes consistent with result of small vessel disease. No CT evidence of large acute infarct although with the degree of white matter disease excluding small or medium size infarct difficult by CT. 6 mm right frontal calcified meningioma without associated mass effect stable in appearance. Vascular calcifications. Post  lens replacement calcified on the left. Prominent mucosal thickening maxillary sinuses bilaterally and left sphenoid sinus. Opacification ethmoid sinus air cells bilaterally. IMPRESSION: Shunt within right lateral ventricle without hydrocephalus. Global atrophy. No acute intracranial hemorrhage. Remote infarcts posterior left frontal lobe, corona radiata bilaterally, left caudate head and right lenticular nucleus. Prominent small vessel disease. No CT evidence of large acute infarct. 6 mm right frontal calcified meningioma without associated mass effect stable in appearance. Vascular calcifications. Prominent mucosal thickening maxillary sinuses bilaterally and left sphenoid sinus. Opacification ethmoid sinus air cells bilaterally. Electronically Signed   By: [name]   On: 11/18/2015 08:30   I have personally reviewed and evaluated these images and lab results as part of my medical decision-making.   EKG Interpretation   Date/Time:  Thursday November 18 2015 07:32:00 EST Ventricular Rate:  76 PR Interval:  138 QRS Duration: 88 QT Interval:  430 QTC Calculation: 483 R Axis:   16 Text Interpretation:  Sinus rhythm Atrial premature complex Repol abnrm  suggests ischemia, diffuse leads No significant change since last tracing  Confirmed by NGUYEN, EMILY ([id number]) on 11/18/2015 7:38:32 AM      MDM   Final diagnoses:  None   Patient with cough, altered mental status and generalized fatigue The patient had basic labs, EKG and chest x-ray, noncontrasted head CT, CBG monitoring, Hemoccult, urinalysis, i-STAT troponin and i-STAT lactic acid  Patient was found to have hemoglobin of 6.9, BNP elevated, right pleural effusion with pulmonary edema, hemoccult positive (with normal appearing brown stool) There were no EKG changes, lactic acid and troponin were negative.  Patient has macrocytic anemia (known pancytopenia worked  up by HemeOnc in the past), drop in hemoglobin appears acute, however  I do not have access from his labs from yesterday. He does have a history of thrombocytopenia, his last platelet count was under 50, improved today to 95.   Head CT was negative for acute intracranial hemorrhage, his VP shunt was present in the right lateral ventricle without hydrocephalus.  Chest x-ray was significant for pulmonary vascular congestion, cardiomegaly, right-sided pleural effusion.  Plain film read as right lower lobe infiltrate versus mass, further imaging will be needed to differentiate.  On exam he did become more dyspneic if laid flat, his oral mucosa and palpable conjunctivae were pale, he otherwise appeared stable and had normal vital signs without fever, tachycardia or hypotension.  Type and screen and 1 unit of packed red blood cells was ordered, triad hospitalist Nevin Bloodgood and Dr. Tyrell Antonio) to admit for symptomatic anemia.  Dr. Justin Mend (nephrology) was consulted for assitance with transfusion to monitor and avoid further fluid overload.    Pt admitted to tele  CRITICAL CARE  Performed by: Delsa Grana Total critical care time: 71 Critical care time was exclusive of separately billable procedures and treating other patients. Critical care was necessary to treat or prevent imminent or life-threatening deterioration. Critical care was time spent personally by me on the following activities: development of treatment plan with patient and/or surrogate as well as nursing, discussions with consultants, evaluation of patient's response to treatment, examination of patient, obtaining history from patient or surrogate, ordering and performing treatments and interventions, ordering and review of laboratory studies, ordering and review of radiographic studies, pulse oximetry and re-evaluation of patient's condition.     Delsa Grana, PA-C 11/18/15 [place], PA-C 11/19/15 [id number]  Harvel Quale, MD 11/20/15 [id number]

## 2015-11-18 NOTE — ED Notes (Addendum)
CRITICAL VALUE ALERT  HGB 6.9  Critical value received:  0713  Date of notification:  11/18/2015  Time of notification: 0715  Nurse who received alert:  Kristopher Glee RN  EDP notified: Delsa Grana PA

## 2015-11-18 NOTE — Procedures (Signed)
Patient was seen on dialysis and the procedure was supervised.  BFR 400  Via AVF BP is  106/49.   Patient appears to be tolerating treatment well- pulm edema and getting blood   Tashauna Caisse A 11/18/2015

## 2015-11-18 NOTE — ED Notes (Signed)
Admitting Provider at the bedside.  

## 2015-11-18 NOTE — Consult Note (Signed)
[facility] Renal Consultation Note  Indication for Consultation:  Management of ESRD/hemodialysis; anemia, hypertension/volume and secondary hyperparathyroidism  HPI: AYOUB AREY is a 69 y.o. male with ESRD [facility] MWF HD) brought  To ER Via EMS  With wife's history of Progressive weakness,confusion, dry cough with poor appetite. In ER = K 3.4,  CXR Pulmonary Edema, R Pl Effusion,"can not exclude R lower infiltrate or mass"/ HGB 6.9 heme pos stool  (last op hgb trnds= (11-21)-16 7.8,<8.0( 11-16)<8.0,(11-09)<  7.8 (11-02). At [facility]  Leaving below edw62.0kg   past 2 txs 60.5 yest and 61.6 11-15-15 tx . No ho fevers, cp,  Patient seen on HD- no complaints at all- knew he was at [facility]      Past Medical History  Diagnosis Date  . Anemia   . Diastolic dysfunction   . Retinitis pigmentosa     Blindness--has shunt --placed yrs ago in [place]..  . Arthritis     Gout  . Hypertension     dx--"long time"  . CKD (chronic kidney disease), stage IV ([place])     a. L upper extremity AV fistula created 07/2012.  [name] BPH (benign prostatic hyperplasia)   . Blind   . Gangrene of finger ([place])     a. 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  . Colon polyps   . Pericardial effusion     a. Noted on echo 10/2009. b. Again seen on echo 09/2013.  [name] CHF (congestive heart failure) ([place])   . COPD (chronic obstructive pulmonary disease) ([place])   . GI (gastrointestinal bleed) feb 2016  . Irregular heart rate 03/03/2015  . Cardiac arrest [facility])     Past Surgical History  Procedure Laterality Date  . Cataract extraction w/ intraocular lens  implant, bilateral Bilateral   . Knee ligament reconstruction Left   . Av fistula placement  07/15/2012    Procedure: ARTERIOVENOUS (AV) FISTULA CREATION;  Surgeon: Rosetta Posner, MD;  Location: [place];  Service: Vascular;  Laterality: Left;  . Amputation  12/30/2012    Procedure: AMPUTATION DIGIT;  Surgeon: Tennis Must, MD;  Location: [place];   Service: Orthopedics;  Laterality: Left;  LEFT SMALL FINGER AMPUTATION  . Pericardiocentesis  09/2013  . Pericardial tap N/A 09/19/2013    Procedure: PERICARDIAL TAP;  Surgeon: Blane Ohara, MD;  Location: [facility] CATH [facility];  Service: Cardiovascular;  Laterality: N/A;      Family History  Problem Relation Age of Onset  . Ovarian cancer Mother   . Kidney disease Neg Hx   . Gallbladder disease Neg Hx   . Esophageal cancer Neg Hx   . Heart disease Neg Hx   . Stomach cancer Neg Hx   . Colon polyps Brother   . Colon cancer Brother       reports that he has been smoking Cigarettes.  He has a 25 pack-year smoking history. He has never used smokeless tobacco. He reports that he does not drink alcohol or use illicit drugs.   Allergies  Allergen Reactions  . Ibuprofen Other (See Comments)    Kidney problems-dialysis patient  . Nsaids Other (See Comments)    Kidney problems-dialysis patient    Prior to Admission medications   Medication Sig Start Date End Date Taking? Authorizing Provider  allopurinol (ZYLOPRIM) 100 MG tablet Take 100 mg by mouth daily.    Yes Historical Provider, MD  aspirin 81 MG tablet Take 81 mg by mouth daily.   Yes Historical  Provider, MD  carvedilol (COREG) 12.5 MG tablet Take 1 tablet (12.5 mg total) by mouth 2 (two) times daily with a meal. 09/21/15  Yes Bonnielee Haff, MD  diltiazem (CARDIZEM CD) 120 MG 24 hr capsule Take 1 capsule (120 mg total) by mouth daily. 10/01/15  Yes Dixie Dials, MD  doxercalciferol (HECTOROL) 4 MCG/2ML injection Inject 0.5 mLs (1 mcg total) into the vein every Monday, Wednesday, and Friday with hemodialysis. 08/24/14  Yes Mechele Claude, DO  ferric gluconate 125 mg in sodium chloride 0.9 % 100 mL Inject 125 mg into the vein every Monday, Wednesday, and Friday with hemodialysis. 08/24/14  Yes Jessica Upchurch Vann, DO  guaiFENesin (MUCINEX) 600 MG 12 hr tablet Take 1 tablet (600 mg total) by mouth 2 (two) times daily as needed  (congestion). 03/04/15  Yes Dixie Dials, MD  Multiple Vitamin (MULTIVITAMIN WITH MINERALS) TABS tablet Take 1 tablet by mouth daily.   Yes Historical Provider, MD  SPIRIVA RESPIMAT 2.5 MCG/ACT AERS INHALE 2 PUFFS INTO THE LUNGS EVERY MORNING 08/03/15  Yes [name], MD  acetaminophen (TYLENOL) 500 MG tablet Take 1,000 mg by mouth every 6 (six) hours as needed for moderate pain.    Historical Provider, MD  albuterol (PROVENTIL HFA;VENTOLIN HFA) 108 (90 BASE) MCG/ACT inhaler Inhale 1-2 puffs into the lungs every 6 (six) hours as needed for wheezing or shortness of breath.    Historical Provider, MD  diphenhydrAMINE (BENADRYL) 25 MG tablet Take 25 mg by mouth every 6 (six) hours as needed for itching or allergies (congestion).     Historical Provider, MD    NTI:[id number] **OR** acetaminophen, albuterol, diphenhydrAMINE, guaiFENesin, HYDROcodone-acetaminophen, ondansetron **OR** ondansetron (ZOFRAN) IV  Results for orders placed or performed during the hospital encounter of 11/18/15 (from the past 48 hour(s))  Comprehensive metabolic panel     Status: Abnormal   Collection Time: 11/18/15  6:39 AM  Result Value Ref Range   Sodium 141 135 - 145 mmol/L   Potassium 3.4 (L) 3.5 - 5.1 mmol/L   Chloride 98 (L) 101 - 111 mmol/L   CO2 34 (H) 22 - 32 mmol/L   Glucose, Bld 100 (H) 65 - 99 mg/dL   BUN 11 6 - 20 mg/dL   Creatinine, Ser 4.38 (H) 0.61 - 1.24 mg/dL   Calcium 9.2 8.9 - 10.3 mg/dL   Total Protein 7.3 6.5 - 8.1 g/dL   Albumin 3.0 (L) 3.5 - 5.0 g/dL   AST 19 15 - 41 U/L   ALT 14 (L) 17 - 63 U/L   Alkaline Phosphatase 53 38 - 126 U/L   Total Bilirubin 0.6 0.3 - 1.2 mg/dL   GFR calc non Af Amer 13 (L) >60 mL/min   GFR calc Af Amer 15 (L) >60 mL/min    Comment: (NOTE) The eGFR has been calculated using the CKD EPI equation. This calculation has not been validated in all clinical situations. eGFR's persistently <60 mL/min signify possible Chronic Kidney Disease.    Anion gap 9 5 - 15   CBC     Status: Abnormal   Collection Time: 11/18/15  6:39 AM  Result Value Ref Range   WBC 4.1 4.0 - 10.5 K/uL   RBC 2.28 (L) 4.22 - 5.81 MIL/uL   Hemoglobin 6.9 (LL) 13.0 - 17.0 g/dL    Comment: REPEATED TO VERIFY CRITICAL RESULT CALLED TO, READ BACK BY AND VERIFIED WITH: HOWELL,E RN 1950 12.8.16 MCADOO,G    HCT 23.0 (L) 39.0 - 52.0 %  MCV 100.9 (H) 78.0 - 100.0 fL   MCH 30.3 26.0 - 34.0 pg   MCHC 30.0 30.0 - 36.0 g/dL   RDW 17.0 (H) 11.5 - 15.5 %   Platelets 95 (L) 150 - 400 K/uL    Comment: SPECIMEN CHECKED FOR CLOTS PLATELET COUNT CONFIRMED BY SMEAR   Brain natriuretic peptide     Status: Abnormal   Collection Time: 11/18/15  6:39 AM  Result Value Ref Range   B Natriuretic Peptide 2264.3 (H) 0.0 - 100.0 pg/mL  CBG monitoring, ED     Status: None   Collection Time: 11/18/15  7:32 AM  Result Value Ref Range   Glucose-Capillary 88 65 - 99 mg/dL  Type and screen [place]     Status: None (Preliminary result)   Collection Time: 11/18/15  7:32 AM  Result Value Ref Range   ABO/RH(D) O POS    Antibody Screen NEG    Sample Expiration 11/21/2015    Unit Number [id number]    Blood Component Type RED CELLS,LR    Unit division 00    Status of Unit ISSUED    Transfusion Status OK TO TRANSFUSE    Crossmatch Result Compatible   I-stat troponin, ED     Status: None   Collection Time: 11/18/15  8:02 AM  Result Value Ref Range   Troponin i, poc 0.02 0.00 - 0.08 ng/mL   Comment 3            Comment: Due to the release kinetics of cTnI, a negative result within the first hours of the onset of symptoms does not rule out myocardial infarction with certainty. If myocardial infarction is still suspected, repeat the test at appropriate intervals.   POC occult blood, ED Provider will collect     Status: Abnormal   Collection Time: 11/18/15  8:03 AM  Result Value Ref Range   Fecal Occult Bld POSITIVE (A) NEGATIVE  I-Stat CG4 Lactic Acid, ED     Status: None    Collection Time: 11/18/15  8:04 AM  Result Value Ref Range   Lactic Acid, Venous 1.03 0.5 - 2.0 mmol/L  Prepare RBC     Status: None   Collection Time: 11/18/15  8:58 AM  Result Value Ref Range   Order Confirmation ORDER PROCESSED BY BLOOD BANK   Urinalysis, Routine w reflex microscopic (not at [facility])     Status: Abnormal   Collection Time: 11/18/15  9:02 AM  Result Value Ref Range   Color, Urine YELLOW YELLOW   APPearance CLOUDY (A) CLEAR   Specific Gravity, Urine 1.011 1.005 - 1.030   pH 8.5 (H) 5.0 - 8.0   Glucose, UA NEGATIVE NEGATIVE mg/dL   Hgb urine dipstick SMALL (A) NEGATIVE   Bilirubin Urine NEGATIVE NEGATIVE   Ketones, ur NEGATIVE NEGATIVE mg/dL   Protein, ur 100 (A) NEGATIVE mg/dL   Nitrite NEGATIVE NEGATIVE   Leukocytes, UA SMALL (A) NEGATIVE  Urine microscopic-add on     Status: Abnormal   Collection Time: 11/18/15  9:02 AM  Result Value Ref Range   Squamous Epithelial / LPF 0-5 (A) NONE SEEN   WBC, UA 6-30 0 - 5 WBC/hpf   RBC / HPF 0-5 0 - 5 RBC/hpf   Bacteria, UA FEW (A) NONE SEEN   Urine-Other LESS THAN 10 mL OF URINE SUBMITTED   Vitamin B12     Status: None   Collection Time: 11/18/15  9:55 AM  Result Value Ref Range  Vitamin B-12 397 180 - 914 pg/mL    Comment: (NOTE) This assay is not validated for testing neonatal or myeloproliferative syndrome specimens for Vitamin B12 levels.   Folate     Status: None   Collection Time: 11/18/15  9:55 AM  Result Value Ref Range   Folate 11.3 >5.9 ng/mL  Iron and TIBC     Status: Abnormal   Collection Time: 11/18/15  9:55 AM  Result Value Ref Range   Iron 40 (L) 45 - 182 ug/dL   TIBC 141 (L) 250 - 450 ug/dL   Saturation Ratios 28 17.9 - 39.5 %   UIBC 101 ug/dL  Ferritin     Status: Abnormal   Collection Time: 11/18/15  9:55 AM  Result Value Ref Range   Ferritin 1185 (H) 24 - 336 ng/mL  Reticulocytes     Status: Abnormal   Collection Time: 11/18/15  9:55 AM  Result Value Ref Range   Retic Ct Pct 2.9 0.4 -  3.1 %   RBC. 2.29 (L) 4.22 - 5.81 MIL/uL   Retic Count, Manual 66.4 19.0 - 186.0 K/uL   .  ROS: see hpi for positives,he is a poor historian and wife not present on exam  Physical Exam: Filed Vitals:   11/18/15 1030 11/18/15 1100  BP: 144/54 147/49  Pulse: 77 80  Temp:    Resp: 15 18     General: Alert elderly AAM chronically ill, nad , on HD HEENT: NAD, blind, MMM Neck: supple .mild  Heart: rrr no rub, mur or gallop Lungs: faint rales ,nonlabored breathing Abdomen: soft nt, ND Extremities: no pedal edema Skin: no overt rash Neuro: pleasantly confused  to date aware at [facility] , moves allextrem Dialysis Access:patent onhd LUA AVF  Dialysis Orders: Center: [facility]  on MWF . EDW 62 HD [place] 2.0  K2.25 CA  Time 4hrs Heparin NONE. Access LUA AVF B    Hectorol 7 mcg IV/HD Mircera 221mg q fri hd last on 11/12/15 hd    Other op labs 11-01-15 hgb 7.8 ca 9.6 phos 5.2 pth 73   Assessment/Plan 1. Pulmonary edema- ho Diastolic Dysfunction  HD today UF of 3 liters total goal today and also tomorrow. Noted leaving below op edw past 2 txs needs lower edw at dc  . Also hd Friday to keep on schedule.   2. Chronic GI Bleed- no hep. HD , Admit team eval. 3. AMS / confusion on admit- admit team wu 4. [place]D -  [place] , keep on schedule HD MWF with vol overload hd in am also 5. Hypertension/volume  - bp 144/79 in ER /HD as above with vol overload.  BP here is lower- is on coreg and dilt- will try to hold dilt and see if that allows more UF-  6. Anemia  Of ESRD/ GI BLD/ Iron Def- PRBCS transfuson  2units today/ no hep hd, aranesp on hd and give Fe x5 with TFS 28% blood should help BP as well 7. Metabolic bone disease -  With last pth <100 and Corec ca 10.0 hold 7 Mcg Hect. If ca decr. Can restart vit d lower dose 276m next week hd/ no binder listed as op follow up ca ,phos 8. DM   Per admit 9. Blind  DaErnest HaberPA-C [facility] BeMurlean [phone]/07/2015, 11:55  AM   Patient seen and examined, agree with above note with above modifications. 89ear old BM with ESRD- seems to  be losing weight leaving below EDW- brought to ED for confusion but CXR found pulmonary edema/pleural effusion- going to do extra HD today for volume- stop dilt to give [place] more BP to work with - plan for HD also tomorrow to keep on schedule- hgb 6.9- transfusing and work up per primary team Corliss Parish, MD 11/18/2015

## 2015-11-19 ENCOUNTER — Inpatient Hospital Stay (HOSPITAL_COMMUNITY): Payer: Medicare Other

## 2015-11-19 DIAGNOSIS — N186 End stage renal disease: Secondary | ICD-10-CM

## 2015-11-19 DIAGNOSIS — Z992 Dependence on renal dialysis: Secondary | ICD-10-CM

## 2015-11-19 DIAGNOSIS — J90 Pleural effusion, not elsewhere classified: Secondary | ICD-10-CM

## 2015-11-19 DIAGNOSIS — I5032 Chronic diastolic (congestive) heart failure: Secondary | ICD-10-CM

## 2015-11-19 DIAGNOSIS — G9340 Encephalopathy, unspecified: Secondary | ICD-10-CM

## 2015-11-19 DIAGNOSIS — R195 Other fecal abnormalities: Secondary | ICD-10-CM

## 2015-11-19 DIAGNOSIS — N189 Chronic kidney disease, unspecified: Secondary | ICD-10-CM

## 2015-11-19 DIAGNOSIS — D631 Anemia in chronic kidney disease: Secondary | ICD-10-CM

## 2015-11-19 DIAGNOSIS — I4891 Unspecified atrial fibrillation: Secondary | ICD-10-CM

## 2015-11-19 LAB — RENAL FUNCTION PANEL
Albumin: 3 g/dL — ABNORMAL LOW (ref 3.5–5.0)
Anion gap: 11 (ref 5–15)
BUN: <5 mg/dL — ABNORMAL LOW (ref 6–20)
CO2: 31 mmol/L (ref 22–32)
Calcium: 9.1 mg/dL (ref 8.9–10.3)
Chloride: 96 mmol/L — ABNORMAL LOW (ref 101–111)
Creatinine, Ser: 2.45 mg/dL — ABNORMAL HIGH (ref 0.61–1.24)
GFR calc Af Amer: 29 mL/min — ABNORMAL LOW (ref >60)
GFR calc non Af Amer: 25 mL/min — ABNORMAL LOW (ref >60)
Glucose, Bld: 112 mg/dL — ABNORMAL HIGH (ref 65–99)
Phosphorus: 1.8 mg/dL — ABNORMAL LOW (ref 2.5–4.6)
Potassium: 4.1 mmol/L (ref 3.5–5.1)
Sodium: 138 mmol/L (ref 135–145)

## 2015-11-19 LAB — CBC
HCT: 35.2 % — ABNORMAL LOW (ref 39.0–52.0)
HEMATOCRIT: 30.7 % — AB (ref 39.0–52.0)
HEMOGLOBIN: 10 g/dL — AB (ref 13.0–17.0)
Hemoglobin: 11.2 g/dL — ABNORMAL LOW (ref 13.0–17.0)
MCH: 30.5 pg (ref 26.0–34.0)
MCH: 30 pg (ref 26.0–34.0)
MCHC: 32.6 g/dL (ref 30.0–36.0)
MCHC: 31.8 g/dL (ref 30.0–36.0)
MCV: 93.6 fL (ref 78.0–100.0)
MCV: 94.4 fL (ref 78.0–100.0)
Platelets: 92 10*3/uL — ABNORMAL LOW (ref 150–400)
Platelets: 93 K/uL — ABNORMAL LOW (ref 150–400)
RBC: 3.28 MIL/uL — ABNORMAL LOW (ref 4.22–5.81)
RBC: 3.73 MIL/uL — ABNORMAL LOW (ref 4.22–5.81)
RDW: 18.3 % — ABNORMAL HIGH (ref 11.5–15.5)
RDW: 18 % — ABNORMAL HIGH (ref 11.5–15.5)
WBC: 4.3 10*3/uL (ref 4.0–10.5)
WBC: 5.9 10*3/uL (ref 4.0–10.5)

## 2015-11-19 LAB — BASIC METABOLIC PANEL
ANION GAP: 7 (ref 5–15)
BUN: 7 mg/dL (ref 6–20)
CHLORIDE: 96 mmol/L — AB (ref 101–111)
CO2: 33 mmol/L — ABNORMAL HIGH (ref 22–32)
Calcium: 8.9 mg/dL (ref 8.9–10.3)
Creatinine, Ser: 3.41 mg/dL — ABNORMAL HIGH (ref 0.61–1.24)
GFR calc Af Amer: 20 mL/min — ABNORMAL LOW (ref >60)
GFR, EST NON AFRICAN AMERICAN: 17 mL/min — AB (ref >60)
GLUCOSE: 89 mg/dL (ref 65–99)
POTASSIUM: 3.7 mmol/L (ref 3.5–5.1)
SODIUM: 136 mmol/L (ref 135–145)

## 2015-11-19 LAB — TYPE AND SCREEN
ABO/RH(D): O POS
Antibody Screen: NEGATIVE
UNIT DIVISION: 0
Unit division: 0

## 2015-11-19 LAB — LACTIC ACID, PLASMA: Lactic Acid, Venous: 2.4 mmol/L (ref 0.5–2.0)

## 2015-11-19 MED ORDER — DEXTROSE 5 % IV SOLN
2.0000 g | INTRAVENOUS | Status: DC
  Administered 2015-11-22: 2 g via INTRAVENOUS
  Filled 2015-11-19 (×2): qty 2

## 2015-11-19 MED ORDER — PENTAFLUOROPROP-TETRAFLUOROETH EX AERO: 1.0000 "application " | INHALATION_SPRAY | CUTANEOUS | Status: DC | PRN

## 2015-11-19 MED ORDER — SODIUM CHLORIDE 0.9 % IV SOLN
100.0000 mL | INTRAVENOUS | Status: DC | PRN
Start: 2015-11-19 — End: 2015-11-22

## 2015-11-19 MED ORDER — SODIUM CHLORIDE 0.9 % IV SOLN: 100.0000 mL | INTRAVENOUS | Status: DC | PRN

## 2015-11-19 MED ORDER — ALTEPLASE 2 MG IJ SOLR
2.0000 mg | Freq: Once | INTRAMUSCULAR | Status: DC | PRN
  Filled 2015-11-19: qty 2

## 2015-11-19 MED ORDER — PANTOPRAZOLE SODIUM 40 MG PO TBEC
40.0000 mg | DELAYED_RELEASE_TABLET | Freq: Every day | ORAL | Status: DC
  Administered 2015-11-19 – 2015-11-22 (×4): 40 mg via ORAL
  Filled 2015-11-19 (×4): qty 1

## 2015-11-19 MED ORDER — DARBEPOETIN ALFA 150 MCG/0.3ML IJ SOSY
PREFILLED_SYRINGE | INTRAMUSCULAR | Status: AC
Start: 2015-11-19 — End: 2015-11-19
  Filled 2015-11-19: qty 0.3

## 2015-11-19 MED ORDER — SODIUM CHLORIDE 0.9 % IV BOLUS (SEPSIS)
250.0000 mL | Freq: Once | INTRAVENOUS | Status: AC
  Administered 2015-11-19: 250 mL via INTRAVENOUS

## 2015-11-19 MED ORDER — VANCOMYCIN HCL 10 G IV SOLR
1250.0000 mg | Freq: Once | INTRAVENOUS | Status: AC
  Administered 2015-11-19: 1250 mg via INTRAVENOUS
  Filled 2015-11-19: qty 1250

## 2015-11-19 MED ORDER — LIDOCAINE HCL (PF) 1 % IJ SOLN: 5.0000 mL | INTRAMUSCULAR | Status: DC | PRN

## 2015-11-19 MED ORDER — VANCOMYCIN HCL 10 G IV SOLR
1250.0000 mg | Freq: Once | INTRAVENOUS | Status: DC
  Filled 2015-11-19: qty 1250

## 2015-11-19 MED ORDER — LIDOCAINE-PRILOCAINE 2.5-2.5 % EX CREA
1.0000 "application " | TOPICAL_CREAM | CUTANEOUS | Status: DC | PRN
  Filled 2015-11-19: qty 5

## 2015-11-19 MED ORDER — DOXERCALCIFEROL 4 MCG/2ML IV SOLN
INTRAVENOUS | Status: AC
  Filled 2015-11-19: qty 2

## 2015-11-19 MED ORDER — VANCOMYCIN HCL 500 MG IV SOLR
500.0000 mg | INTRAVENOUS | Status: DC
  Administered 2015-11-22: 500 mg via INTRAVENOUS
  Filled 2015-11-19 (×2): qty 500

## 2015-11-19 MED ORDER — DEXTROSE 5 % IV SOLN
2.0000 g | Freq: Once | INTRAVENOUS | Status: AC
  Administered 2015-11-19: 2 g via INTRAVENOUS
  Filled 2015-11-19: qty 2

## 2015-11-19 MED ORDER — HEPARIN SODIUM (PORCINE) 1000 UNIT/ML DIALYSIS: 1000.0000 [IU] | INTRAMUSCULAR | Status: DC | PRN

## 2015-11-19 NOTE — Progress Notes (Signed)
[facility] PROGRESS NOTE  Cameron Mendez [id number] DOB: 06-01-1946 DOA: 11/18/2015 PCP: [name], MD Interim summary: Cameron Mendez is a 69 y.o. male with multiple medical problems including end-stage renal disease on hemodialysis Monday, Wednesday, Friday  brought in altered mental status and productive cough since 2 weeks. On arrival to ED, he was found to be anemic with hemoglobin of 6.9, heme positive stools, fluid overloaded and encephalopathic. He was admitted to medical service for further evaluation.  His CXR showed chronic right pleural effusion and possible pneumonia, and he is hypoxic requiring 2 lit [place] oxygen. He was started on broad spectrum antibiotics for HCAP. He was also given 2 units of prbc transfusion and gastroenterology consulted for recommendation. Of note he underwent EGD AND COLONOSCOPY in 3/16 showing duodenitis and colon diverticulosis and colon polyps.   Assessment/Plan: 1. Blood loss anemia with heme positive stools: - possibly chronic blood loss from diverticulosis.  - resume PPI, and keep hemoglobin greater than 7.  - further management asp er gastroenterology.    2. ESRD: on HD MWF - further management as per renal.  - iron infusions at the time HD.  3. Acute respiratory failure with chronic right pleural effusion, with hypoxia requiring 2l it of [place] oxygen: -suspect pneumonia , started on broad spectrum antibiotics.  - monitor and wean him off oxygen in the 1 to 2 days.    4. Acute encephalopathy: probably from the pneumonia.  Initial CT head negative for  Acute stroke or bleed.  Follow up MRI Brain ordered.    5. Chronic thrombocytopenia: - stable counts.   6. Acute on chronic diastolic heart failure: CXR with pulm edema, and he had HD on 12/8 and will get HD on 12/9 as per RENAL.   7. Paroxysmal atrial fibrillation: currently in sinus.  Resume home meds.   Code Status: full code.  Family Communication: discussed with wife over  the phone.  Disposition Plan: possible 2 to 3 day pending further investigation and PT eval.   Consultants: Renal  Gastroenterology.  Procedures: HD MWF. Antibiotics:  Vancomycin   fortaz  HPI/Subjective: Confused, no complaints. On 2l it OF [place] oxygen.   Objective: Filed Vitals:   11/19/15 1225 11/19/15 1230  BP: 155/61 158/58  Pulse: 72 65  Temp: 98.5 F (36.9 C)   Resp: 12 15    Intake/Output Summary (Last 24 hours) at 11/19/15 1259 Last data filed at 11/19/15 [id number]  Gross per 24 hour  Intake    516 ml  Output   2207 ml  Net  -1691 ml   Filed Weights   11/18/15 1730 11/18/15 2127 11/19/15 1225  Weight: 55.4 kg (122 lb 2.2 oz) 55.8 kg (123 lb 0.3 oz) 58.2 kg (128 lb 4.9 oz)    Exam:   General:  Arousable, mumbling, repeated his name, but confused  Not in any distress  Cardiovascular: s1s2  Respiratory: diminished at bases, more on the right  Abdomen: soft non tender non distended bowel sounds heard  Musculoskeletal: no pedal edema.   Data Reviewed: Basic Metabolic Panel:  Recent Labs Lab 11/18/15 0639 11/19/15 0525  NA 141 136  K 3.4* 3.7  CL 98* 96*  CO2 34* 33*  GLUCOSE 100* 89  BUN 11 7  CREATININE 4.38* 3.41*  CALCIUM 9.2 8.9   Liver Function Tests:  Recent Labs Lab 11/18/15 0639  AST 19  ALT 14*  ALKPHOS 53  BILITOT 0.6  PROT 7.3  ALBUMIN 3.0*  No results for input(s): LIPASE, AMYLASE in the last 168 hours. No results for input(s): AMMONIA in the last 168 hours. CBC:  Recent Labs Lab 11/18/15 0639 11/18/15 1918 11/19/15 0525  WBC 4.1 5.6 4.3  HGB 6.9* 10.3* 10.0*  HCT 23.0* 31.9* 30.7*  MCV 100.9* 94.7 93.6  PLT 95* 96* 92*   Cardiac Enzymes: No results for input(s): CKTOTAL, CKMB, CKMBINDEX, TROPONINI in the last 168 hours. BNP (last 3 results)  Recent Labs  09/13/15 0657 09/28/15 1842 11/18/15 0639  BNP 1766.2* 764.8* 2264.3*    ProBNP (last 3 results) No results for input(s): PROBNP in the last 8760  hours.  CBG:  Recent Labs Lab 11/18/15 0732  GLUCAP 88    No results found for this or any previous visit (from the past 240 hour(s)).   Studies: Dg Chest 2 View  11/18/2015  CLINICAL DATA:  69 year old hypertensive male with cough and altered mental status. Initial encounter. EXAM: CHEST  2 VIEW COMPARISON:  09/28/2015 chest x-ray and 09/13/2015 chest CT. FINDINGS: Cardiomegaly. Pulmonary vascular congestion Calcified mildly tortuous aorta. Right sided pleural effusion may reflect result of pulmonary edema with adjacent atelectasis. By plain film, cannot exclude right lower lobe infiltrate or mass. Follow-up until clearance recommended. Ventriculoperitoneal shunt courses over the right thorax. IMPRESSION: Cardiomegaly. Pulmonary vascular congestion Right sided pleural effusion may reflect result of pulmonary edema with adjacent atelectasis. By plain film, cannot exclude right lower lobe infiltrate or mass. Follow-up until clearance recommended. Electronically Signed   By: [name]   On: 11/18/2015 07:28   Ct Head Wo Contrast  11/18/2015  CLINICAL DATA:  69 year old male with altered mental status. Hemodialysis. Legally blind. Initial encounter. EXAM: CT HEAD WITHOUT CONTRAST TECHNIQUE: Contiguous axial images were obtained from the base of the skull through the vertex without intravenous contrast. COMPARISON:  09/13/2015 head CT. FINDINGS: Shunt within right lateral ventricle unchanged in position without development of hydrocephalus or slit like appearance of the ventricles. Global atrophy. No acute intracranial hemorrhage. Remote infarcts posterior left frontal lobe, corona radiata bilaterally, left caudate head and right lenticular nucleus. Prominent white matter type changes consistent with result of small vessel disease. No CT evidence of large acute infarct although with the degree of white matter disease excluding small or medium size infarct difficult by CT. 6 mm right frontal  calcified meningioma without associated mass effect stable in appearance. Vascular calcifications. Post lens replacement calcified on the left. Prominent mucosal thickening maxillary sinuses bilaterally and left sphenoid sinus. Opacification ethmoid sinus air cells bilaterally. IMPRESSION: Shunt within right lateral ventricle without hydrocephalus. Global atrophy. No acute intracranial hemorrhage. Remote infarcts posterior left frontal lobe, corona radiata bilaterally, left caudate head and right lenticular nucleus. Prominent small vessel disease. No CT evidence of large acute infarct. 6 mm right frontal calcified meningioma without associated mass effect stable in appearance. Vascular calcifications. Prominent mucosal thickening maxillary sinuses bilaterally and left sphenoid sinus. Opacification ethmoid sinus air cells bilaterally. Electronically Signed   By: [name]   On: 11/18/2015 08:30   Portable Chest 1 View  11/19/2015  CLINICAL DATA:  Several day history of cough, onset of shortness of breath today. EXAM: PORTABLE CHEST 1 VIEW COMPARISON:  PA and lateral chest x-ray of November 18, 2015 FINDINGS: The lungs are adequately inflated. There is a smaller moderate-sized right pleural effusion layering posteriorly. Right basilar atelectasis or pneumonia is suspected. The left lung is clear though the interstitial markings are mildly increased. The cardiac silhouette remains  enlarged. The central pulmonary vascularity is engorged. The ventriculoperitoneal shunt tube is unchanged in appearance. The bony thorax exhibits no acute abnormality. IMPRESSION: Stable moderate size right pleural effusion layering posteriorly. Right lower lobe atelectasis or pneumonia. Stable cardiomegaly with mild pulmonary vascular congestion. Ventriculoperitoneal shunt tube visible as well as a tube fragment project over the right hemithorax which are chronic findings. Electronically Signed   By: David  Martinique M.D.   On:  11/19/2015 07:27    Scheduled Meds: . sodium chloride   Intravenous Once  . sodium chloride   Intravenous Once  . allopurinol  100 mg Oral Daily  . aspirin  81 mg Oral Daily  . carvedilol  12.5 mg Oral BID WC  . cefTAZidime (FORTAZ)  IV  2 g Intravenous Once  . darbepoetin (ARANESP) injection - DIALYSIS  150 mcg Intravenous Q Fri-HD  . doxercalciferol  1 mcg Intravenous Q M,W,F-HD  . ferric gluconate (FERRLECIT/NULECIT) IV  125 mg Intravenous Q M,W,F-HD  . [START ON 11/20/2015] pantoprazole  40 mg Oral Q0600  . tiotropium  18 mcg Inhalation q morning - 10a  . vancomycin  1,250 mg Intravenous Once   Continuous Infusions:   Active Problems:   BLINDNESS, LEGAL, [place] DEFINITION   Essential hypertension, benign   Anemia   COPD (chronic obstructive pulmonary disease) (HCC)   Chronic diastolic CHF (congestive heart failure) (HCC)   Pleural effusion   Atrial fibrillation, currently in sinus rhythm (HCC)   End-stage renal disease on hemodialysis (HCC)   Symptomatic anemia   Heme positive stool   Encephalopathy    Time spent: 25 minutes.     [facility]  [facility] Pager [phone]  If 7PM-7AM, please contact night-coverage at [email or web address], password [email or web address] 11/19/2015, 12:59 PM  LOS: 1 day

## 2015-11-19 NOTE — Consult Note (Signed)
[facility] Consult: 11:22 AM 11/19/2015  LOS: 1 day    Referring Provider: Dr Karleen Hampshire  Primary Care Physician:  [name], MD Primary Gastroenterologist:  Dr. Deatra Ina, Dr Laurence Spates previously.   Reason for Consultation:  FOBT + anemia.    HPI: Cameron Mendez is a 69 y.o. male.  Hx COPD, PAF, ESRD on HD (MWF).  Anemia of chronic disease. Transfusion PRBCs in 08/2014 (Hgb 6.2, 3 units), 12/2014 (Hgb 6.7, 2 units). Thrombocytopenia. Bone marrow biopsy 03/2009 and 09/2014.  Splenomegaly and ill-defined splenic lesion per 2010 ultrasound.  Periph vasc dz.  Retinitis pigmentosa. S/p remote VP shunt.  At HD receives Mircera q Friday.   02/2015 EGD for normocytic anemia, FOBT+.  Bulbar duodenitis, chronic gastritis, sessile 3-5 mm polyp at cardia. Path: peptic duodenitis, chronic gastric inflammation with intestinal metaplasia and focal foveolar hyperplasia. No H. Pylori or villous atrophy.   02/2015 Colonoscopy.  For above and personal hx adenoma. Multiple sessile polyps at ascending/sigmoid. Ascending and sigmoid tics. Path Adenomatous and HP type polyps.   03/2012 Colonoscopy.  Dr Oletta Lamas.  3 tubular adenomas.   01/2009 Colonoscopy for previous hx polyps.  4 sessile polyps removed: tubular adenomas and HP type.  Presented and admitted yesterday with progressive weakness, confusion, anorexia, non-productive cough.  No hx vomiting, abd pain, black or bloody stool.   BNP 2264. Xray with R pleural effusion, not able to exclude RLL infiltrate or mass.  Hgb 6.9 (8.5 on 09/29/15), up to 10 after 2 PRBCs.  MCV 100 (as high as 111 on 09/29/15).  Iron and TIBC low. Ferritin 1185.  FOBT +.    CT head: atrophy, stable R frontal meningioma. Vascular calcifications. Sinusitis.   No NSAIDs or ASA.      Past Medical History    Diagnosis Date  . Anemia   . Diastolic dysfunction   . Retinitis pigmentosa     Blindness--has shunt --placed yrs ago in [place]..  . Arthritis     Gout  . Hypertension     dx--"long time"  . CKD (chronic kidney disease), stage IV ([place])     a. L upper extremity AV fistula created 07/2012.  [name] BPH (benign prostatic hyperplasia)   . Blind   . Gangrene of finger ([place])     a. 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  . Colon polyps   . Pericardial effusion     a. Noted on echo 10/2009. b. Again seen on echo 09/2013.  [name] CHF (congestive heart failure) ([place])   . COPD (chronic obstructive pulmonary disease) ([place])   . GI (gastrointestinal bleed) feb 2016  . Irregular heart rate 03/03/2015  . Cardiac arrest [facility])     Past Surgical History  Procedure Laterality Date  . Cataract extraction w/ intraocular lens  implant, bilateral Bilateral   . Knee ligament reconstruction Left   . Av fistula placement  07/15/2012    Procedure: ARTERIOVENOUS (AV) FISTULA CREATION;  Surgeon: Rosetta Posner, MD;  Location: [facility];  Service: Vascular;  Laterality: Left;  . Amputation  12/30/2012    Procedure: AMPUTATION DIGIT;  Surgeon: Tennis Must, MD;  Location: [facility];  Service: Orthopedics;  Laterality: Left;  LEFT SMALL FINGER AMPUTATION  . Pericardiocentesis  09/2013  . Pericardial tap N/A 09/19/2013    Procedure: PERICARDIAL TAP;  Surgeon: Blane Ohara, MD;  Location: [facility] CATH LAB;  Service: Cardiovascular;  Laterality: N/A;    Prior to Admission medications   Medication Sig Start Date End Date Taking? Authorizing Provider  allopurinol (ZYLOPRIM) 100 MG tablet Take 100 mg by mouth daily.    Yes Historical Provider, MD  aspirin 81 MG tablet Take 81 mg by mouth daily.   Yes Historical Provider, MD  carvedilol (COREG) 12.5 MG tablet Take 1 tablet (12.5 mg total) by mouth 2 (two) times daily with a meal. 09/21/15  Yes Bonnielee Haff, MD  diltiazem (CARDIZEM CD) 120 MG 24 hr capsule  Take 1 capsule (120 mg total) by mouth daily. 10/01/15  Yes Dixie Dials, MD  doxercalciferol (HECTOROL) 4 MCG/2ML injection Inject 0.5 mLs (1 mcg total) into the vein every Monday, Wednesday, and Friday with hemodialysis. 08/24/14  Yes Mechele Claude, DO  ferric gluconate 125 mg in sodium chloride 0.9 % 100 mL Inject 125 mg into the vein every Monday, Wednesday, and Friday with hemodialysis. 08/24/14  Yes Jessica Upchurch Vann, DO  guaiFENesin (MUCINEX) 600 MG 12 hr tablet Take 1 tablet (600 mg total) by mouth 2 (two) times daily as needed (congestion). 03/04/15  Yes Dixie Dials, MD  Multiple Vitamin (MULTIVITAMIN WITH MINERALS) TABS tablet Take 1 tablet by mouth daily.   Yes Historical Provider, MD  SPIRIVA RESPIMAT 2.5 MCG/ACT AERS INHALE 2 PUFFS INTO THE LUNGS EVERY MORNING 08/03/15  Yes [name], MD  acetaminophen (TYLENOL) 500 MG tablet Take 1,000 mg by mouth every 6 (six) hours as needed for moderate pain.    Historical Provider, MD  albuterol (PROVENTIL HFA;VENTOLIN HFA) 108 (90 BASE) MCG/ACT inhaler Inhale 1-2 puffs into the lungs every 6 (six) hours as needed for wheezing or shortness of breath.    Historical Provider, MD  diphenhydrAMINE (BENADRYL) 25 MG tablet Take 25 mg by mouth every 6 (six) hours as needed for itching or allergies (congestion).     Historical Provider, MD    Scheduled Meds: . sodium chloride   Intravenous Once  . sodium chloride   Intravenous Once  . allopurinol  100 mg Oral Daily  . aspirin  81 mg Oral Daily  . carvedilol  12.5 mg Oral BID WC  . cefTAZidime (FORTAZ)  IV  2 g Intravenous Once  . darbepoetin (ARANESP) injection - DIALYSIS  150 mcg Intravenous Q Fri-HD  . doxercalciferol  1 mcg Intravenous Q M,W,F-HD  . ferric gluconate (FERRLECIT/NULECIT) IV  125 mg Intravenous Q M,W,F-HD  . pantoprazole (PROTONIX) IV  40 mg Intravenous Q12H  . tiotropium  18 mcg Inhalation q morning - 10a  . vancomycin  1,250 mg Intravenous Once   Infusions:   PRN  Meds: acetaminophen **OR** acetaminophen, albuterol, diphenhydrAMINE, guaiFENesin, HYDROcodone-acetaminophen, ondansetron **OR** ondansetron (ZOFRAN) IV   Allergies as of 11/18/2015 - Review Complete 11/18/2015  Allergen Reaction Noted  . Ibuprofen Other (See Comments) 09/18/2012  . Nsaids Other (See Comments) 08/21/2014    Family History  Problem Relation Age of Onset  . Ovarian cancer Mother   . Kidney disease Neg Hx   . Gallbladder disease Neg Hx   . Esophageal cancer Neg Hx   . Heart disease Neg  Hx   . Stomach cancer Neg Hx   . Colon polyps Brother   . Colon cancer Brother     Social History   Social History  . Marital Status: Married    Spouse Name: N/A  . Number of Children: 1  . Years of Education: N/A   Occupational History  . unemployeed    Social History Main Topics  . Smoking status: Current Every Day Smoker -- 0.50 packs/day for 50 years    Types: Cigarettes  . Smokeless tobacco: Never Used     Comment: Pt given handout on how to quit smoking 12/31/14  . Alcohol Use: No     Comment: "quit driinking in ~ 2000"  . Drug Use: No  . Sexual Activity: Not Currently   Other Topics Concern  . Not on file   Social History Narrative    REVIEW OF SYSTEMS: Constitutional:  Per HPi.  Generally weak.  ENT:  No nose bleeds Pulm:  DOE, though inactive.  CV:  No palpitations, no LE edema. No chest pain GU:  No hematuria, no frequency GI:  Per HPI.  No dysphagia.   Heme:  Per HPI   Transfusions:  Per HPI Neuro:  No headaches, no peripheral tingling or numbness Derm:  No itching, no rash or sores.  Endocrine:  No sweats or chills.  No polyuria or dysuria Immunization:  09/2015 flu vax.   Travel:  None beyond local counties in last few months.    PHYSICAL EXAM: Vital signs in last 24 hours: Filed Vitals:   11/19/15 0436 11/19/15 1017  BP: 145/36 148/51  Pulse: 70 71  Temp: 98.6 F (37 C) 98.7 F (37.1 C)  Resp: 17 18   Wt Readings from Last 3  Encounters:  11/18/15 55.8 kg (123 lb 0.3 oz)  10/01/15 63.1 kg (139 lb 1.8 oz)  09/21/15 58.5 kg (128 lb 15.5 oz)    General: cachectic, ill looking AAM whose eyes stare out but who is not answering questions or following commands.  Head:  Temporal wasting  Eyes:  No icterus.  conjuntiva pale.  Cloudy irises.  Ears:  Unable to assess as pt not really responsive.   Nose:  No congestion or discharge Mouth:  Dry, clear oral MM.   Neck:  No mass or JVD Lungs:  Clear bil in front.  Faint basilar rales.  No labored resps or cough.  Heart: RRR.  No mrg.  S1/S2 audible Abdomen:  Soft, active BS.  NT, ND.  No mass, hernia or HSM.   Rectal: deferred.  FOBT + in lab.    Musc/Skeltl: no joint swelling.  + kyphosis.  uscle atrophy globally.  Extremities:  No CCE .  Thrill/bruit in LUA fistula Neurologic:  Not providing answers or following commands.  Skin:  No rash, no sores, abrasion healing on left knee.  Tattoos:  none   Psych:  Calm.    Intake/Output from previous day: 12/08 0701 - 12/09 0700 In: 731 [P.O.:120; Blood:611] Out: 2207  Intake/Output this shift: Total I/O In: 120 [P.O.:120] Out: -   LAB RESULTS:  Recent Labs  11/18/15 0639 11/18/15 1918 11/19/15 0525  WBC 4.1 5.6 4.3  HGB 6.9* 10.3* 10.0*  HCT 23.0* 31.9* 30.7*  PLT 95* 96* 92*   BMET Lab Results  Component Value Date   NA 136 11/19/2015   NA 141 11/18/2015   NA 141 09/29/2015   K 3.7 11/19/2015   K 3.4* 11/18/2015   K 3.6  09/29/2015   CL 96* 11/19/2015   CL 98* 11/18/2015   CL 99* 09/29/2015   CO2 33* 11/19/2015   CO2 34* 11/18/2015   CO2 29 09/29/2015   GLUCOSE 89 11/19/2015   GLUCOSE 100* 11/18/2015   GLUCOSE 108* 09/29/2015   BUN 7 11/19/2015   BUN 11 11/18/2015   BUN 42* 09/29/2015   CREATININE 3.41* 11/19/2015   CREATININE 4.38* 11/18/2015   CREATININE 9.07* 09/29/2015   CALCIUM 8.9 11/19/2015   CALCIUM 9.2 11/18/2015   CALCIUM 7.6* 09/29/2015   LFT  Recent Labs  11/18/15 0639    PROT 7.3  ALBUMIN 3.0*  AST 19  ALT 14*  ALKPHOS 53  BILITOT 0.6   PT/INR Lab Results  Component Value Date   INR 1.27 09/29/2015   INR 1.19 09/13/2015   INR 1.24 09/13/2015   Hepatitis Panel No results for input(s): HEPBSAG, HCVAB, HEPAIGM, HEPBIGM in the last 72 hours. C-Diff No components found for: CDIFF Lipase  No results found for: LIPASE  Drugs of Abuse  No results found for: LABOPIA, COCAINSCRNUR, LABBENZ, AMPHETMU, THCU, LABBARB   RADIOLOGY STUDIES: Dg Chest 2 View  11/18/2015  CLINICAL DATA:  69 year old hypertensive male with cough and altered mental status. Initial encounter. EXAM: CHEST  2 VIEW COMPARISON:  09/28/2015 chest x-ray and 09/13/2015 chest CT. FINDINGS: Cardiomegaly. Pulmonary vascular congestion Calcified mildly tortuous aorta. Right sided pleural effusion may reflect result of pulmonary edema with adjacent atelectasis. By plain film, cannot exclude right lower lobe infiltrate or mass. Follow-up until clearance recommended. Ventriculoperitoneal shunt courses over the right thorax. IMPRESSION: Cardiomegaly. Pulmonary vascular congestion Right sided pleural effusion may reflect result of pulmonary edema with adjacent atelectasis. By plain film, cannot exclude right lower lobe infiltrate or mass. Follow-up until clearance recommended. Electronically Signed   By: [name]   On: 11/18/2015 07:28   Ct Head Wo Contrast  11/18/2015  CLINICAL DATA:  69 year old male with altered mental status. Hemodialysis. Legally blind. Initial encounter. EXAM: CT HEAD WITHOUT CONTRAST TECHNIQUE: Contiguous axial images were obtained from the base of the skull through the vertex without intravenous contrast. COMPARISON:  09/13/2015 head CT. FINDINGS: Shunt within right lateral ventricle unchanged in position without development of hydrocephalus or slit like appearance of the ventricles. Global atrophy. No acute intracranial hemorrhage. Remote infarcts posterior left frontal  lobe, corona radiata bilaterally, left caudate head and right lenticular nucleus. Prominent white matter type changes consistent with result of small vessel disease. No CT evidence of large acute infarct although with the degree of white matter disease excluding small or medium size infarct difficult by CT. 6 mm right frontal calcified meningioma without associated mass effect stable in appearance. Vascular calcifications. Post lens replacement calcified on the left. Prominent mucosal thickening maxillary sinuses bilaterally and left sphenoid sinus. Opacification ethmoid sinus air cells bilaterally. IMPRESSION: Shunt within right lateral ventricle without hydrocephalus. Global atrophy. No acute intracranial hemorrhage. Remote infarcts posterior left frontal lobe, corona radiata bilaterally, left caudate head and right lenticular nucleus. Prominent small vessel disease. No CT evidence of large acute infarct. 6 mm right frontal calcified meningioma without associated mass effect stable in appearance. Vascular calcifications. Prominent mucosal thickening maxillary sinuses bilaterally and left sphenoid sinus. Opacification ethmoid sinus air cells bilaterally. Electronically Signed   By: [name]   On: 11/18/2015 08:30   Portable Chest 1 View  11/19/2015  CLINICAL DATA:  Several day history of cough, onset of shortness of breath today. EXAM: PORTABLE CHEST 1  VIEW COMPARISON:  PA and lateral chest x-ray of November 18, 2015 FINDINGS: The lungs are adequately inflated. There is a smaller moderate-sized right pleural effusion layering posteriorly. Right basilar atelectasis or pneumonia is suspected. The left lung is clear though the interstitial markings are mildly increased. The cardiac silhouette remains enlarged. The central pulmonary vascularity is engorged. The ventriculoperitoneal shunt tube is unchanged in appearance. The bony thorax exhibits no acute abnormality. IMPRESSION: Stable moderate size right  pleural effusion layering posteriorly. Right lower lobe atelectasis or pneumonia. Stable cardiomegaly with mild pulmonary vascular congestion. Ventriculoperitoneal shunt tube visible as well as a tube fragment project over the right hemithorax which are chronic findings. Electronically Signed   By: David  Martinique M.D.   On: 11/19/2015 07:27    ENDOSCOPIC STUDIES: Per HPI  IMPRESSION:   *  Chronic, recurrent FOBT + anemia. Improved S/p PRBC x 2.  S/p transfusion. On weekly Mircera and 5 doseseries of Ferric Gluconate added by renal on 12/8 Gastroduodenitis,  recurrent HP and adenomatous colon polyps, colon diverticulosis on 02/2015 studies.   No PPI or H2 blocker at home.  ? Need for capsule endoscopy, repeat EGD?[name]      PLAN:     *  Per Dr Fuller Plan.    *  Continue once daily oral PPI.     Azucena Freed  11/19/2015, 11:22 AM Pager: [phone]      Attending physician's note   I have taken a history, examined the patient and reviewed the chart. I agree with the Advanced Practitioner's note, impression and recommendations. Chronic anemia related to CKD and recurrent heme + stool. Colonoscopy and EGD performed in for same reasons in 02/2015. Hb has increased to 10 post transfusion. Recommend outpatient capsule endoscopy for further evaluation. Former Dr. Deatra Ina patient will establish care with Drs. Nandigam, Armbruster or Danis. Office will call to arrange outpatient follow up. GI signing off.   Lucio Edward, MD [facility] [phone] Mon-Fri 8a-5p [phone] after 5p, weekends, holidays

## 2015-11-19 NOTE — Progress Notes (Signed)
PT Cancellation Note  Patient Details Name: Cameron Mendez MRN: [id number] DOB: 02-25-1946   Cancelled Treatment:    Reason Eval/Treat Not Completed: Patient not medically ready Spoke with RN. MD wishes to start PT after MRI, which is to be performed likely, later today. Will follow-up for PT evaluation.  Ellouise Newer 11/19/2015, 12:15 PM Elayne Snare, [place]

## 2015-11-19 NOTE — Progress Notes (Addendum)
ANTIBIOTIC CONSULT NOTE - INITIAL  Pharmacy Consult for Vancomycin and Ceftazidime Indication: HCAP  Allergies  Allergen Reactions  . Ibuprofen Other (See Comments)    Kidney problems-dialysis patient  . Nsaids Other (See Comments)    Kidney problems-dialysis patient    Patient Measurements: Height: 5\' 3"  (160 cm) Weight: 123 lb 0.3 oz (55.8 kg) IBW/kg (Calculated) : 56.9   Vital Signs: Temp: 98.7 F (37.1 C) (12/09 1017) Temp Source: Oral (12/09 1017) BP: 148/51 mmHg (12/09 1017) Pulse Rate: 71 (12/09 1017) Intake/Output from previous day: 12/08 0701 - 12/09 0700 In: [id number] [P.O.:120; Blood:611] Out: 2207  Intake/Output from this shift: Total I/O In: 120 [P.O.:120] Out: -   Labs:  Recent Labs  11/18/15 0639 11/18/15 1918 11/19/15 0525  WBC 4.1 5.6 4.3  HGB 6.9* 10.3* 10.0*  PLT 95* 96* 92*  CREATININE 4.38*  --  3.41*   Estimated Creatinine Clearance: 16.1 mL/min (by C-G formula based on Cr of 3.41). No results for input(s): VANCOTROUGH, VANCOPEAK, VANCORANDOM, GENTTROUGH, GENTPEAK, GENTRANDOM, TOBRATROUGH, TOBRAPEAK, TOBRARND, AMIKACINPEAK, AMIKACINTROU, AMIKACIN in the last 72 hours.   Microbiology: No results found for this or any previous visit (from the past 720 hour(s)).  Medical History: Past Medical History  Diagnosis Date  . Anemia   . Diastolic dysfunction   . Retinitis pigmentosa     Blindness--has shunt --placed yrs ago in [place]..  . Arthritis     Gout  . Hypertension     dx--"long time"  . CKD (chronic kidney disease), stage IV ([place])     a. L upper extremity AV fistula created 07/2012.  [name] BPH (benign prostatic hyperplasia)   . Blind   . Gangrene of finger ([place])     a. 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  . Colon polyps   . Pericardial effusion     a. Noted on echo 10/2009. b. Again seen on echo 09/2013.  [name] CHF (congestive heart failure) ([place])   . COPD (chronic obstructive pulmonary disease) ([place])   . GI (gastrointestinal  bleed) feb 2016  . Irregular heart rate 03/03/2015  . Cardiac arrest ([place])     Medications:  Scheduled:  . sodium chloride   Intravenous Once  . sodium chloride   Intravenous Once  . allopurinol  100 mg Oral Daily  . aspirin  81 mg Oral Daily  . carvedilol  12.5 mg Oral BID WC  . darbepoetin (ARANESP) injection - DIALYSIS  150 mcg Intravenous Q Fri-HD  . doxercalciferol  1 mcg Intravenous Q M,W,F-HD  . ferric gluconate (FERRLECIT/NULECIT) IV  125 mg Intravenous Q M,W,F-HD  . pantoprazole (PROTONIX) IV  40 mg Intravenous Q12H  . tiotropium  18 mcg Inhalation q morning - 10a   Infusions:   PRN: acetaminophen **OR** acetaminophen, albuterol, diphenhydrAMINE, guaiFENesin, HYDROcodone-acetaminophen, ondansetron **OR** ondansetron (ZOFRAN) IV   Assessment: 89 YOM with hx of ESRD presents with progressive weakness, confusion, and nonproductive cough.  Pharmacy is consulted to dose vancomycin and ceftazidime for possible HCAP.  HD PTA MWF (last session Thurs 12/8), pt not yet on HD schedule for today WBC 4.3, Afebrile, CrCl 16.1, Scr 3.41.  12/9 CXR - RLL atelectasis or PNA  Goal of Therapy:  Vancomycin pre-HD level 15-25 mcg/mL  Plan:  Ceftazidime 2g IV x1 Ceftazidime 2 g IV qHD MWF Vancomycin 1250 mg IV Loading Dose  Vancomycin 500 mg post-HD MWF Measure antibiotic drug levels at steady state Follow up culture results  Monitor for s/sx of clinical improvement  Bennye Alm, PharmD Pharmacy Resident [phone]  11/19/2015,10:21 AM

## 2015-11-19 NOTE — Progress Notes (Signed)
Cameron Mendez Progress Note  Assessment/Plan: 1. Pulmonary edema- ho Diastolic Dysfunction. CXR today shows RLL aatelectasis VS pneumonia. Mild pulmonary congestion.  2. ? HCAP: per primary. On vanc & Zosyn. No fevers.   3. Chronic GI Bleed- no hep. HD , Admit team eval. 4. AMS / confusion: Per primary, remains confused but cooperative.  5. ESRD - HD schedule MWF ([place]) k 3.4 , keep on schedule HD MWF with vol overload. HD again today to keep on schedule.  6. Hypertension/volume-HD yesterday Net UF 2207 post wt 55.4, under OP EDW. Attempt 1-2 liters as CXR still shows evidence of mild congestion.  7. 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 rec'd 2 units PRBCs yesterday. HGB 10.0 today. Monitor HGB. Give ESA today.  Tsat 28%. Start Fe today.  8. Metabolic bone disease -C Ca 9.7. [name] on hold. Will resume later at lower dose.   9. DM Per primary;  10. Blind: staff aware.  11. Thrombocytopenia: Per primary. Platelets 92 today.    12. Acute on chronic Diastolic HF: EF 123456 per echo 09/28/15. On Coreg.   Cameron Mendez H. Makynlee [name]-C 11/19/2015, 11:16 AM  [facility] [phone]  Subjective:  "I don't know how I'm doing". Patient disoriented to time and place. Nonproductive cough, denies SOB. In low fowlers position resting comfortably.  Objective Filed Vitals:   11/18/15 2100 11/18/15 2127 11/19/15 0436 11/19/15 1017  BP:  158/46 145/36 148/51  Pulse: 78 71 70 71  Temp:  98.2 F (36.8 C) 98.6 F (37 C) 98.7 F (37.1 C)  TempSrc:  Oral Oral Oral  Resp:  18 17 18   Height:      Weight:  55.8 kg (123 lb 0.3 oz)    SpO2:  97% 97% 100%   Physical Exam General: chronically ill appearing male looks older than stated age, NAD.  Heart: S1, S2, RRR II/VI systolic M. No G/R/ SR with pacs on monitor.  Lungs: Bilateral breath sounds, decreased in bases R>L. On O2. Abdomen: soft nontender, hypoactive BS Extremities: No LE edema. Healing  abrasion L knee.  Dialysis Access: LUA AVF + thrill/+ bruit  Dialysis Orders: MonWedFri, 4 hrs 0 min, 180NRe Optiflux, BFR 400, DFR Autoflow 1.5, EDW 62 (kg), Dialysate 2.0 K, 2.25 Ca, UFR Profile: Profile 4, Sodium Model: Linear, Access: LUA AV Fistula Heparin: No heparin Mircera: 225 mcg IV q 2 weeks (11/12/15) [name]: 7 mcg IV q MWF   Additional Objective Labs: Basic Metabolic Panel:  Recent Labs Lab 11/18/15 0639 11/19/15 0525  NA 141 136  K 3.4* 3.7  CL 98* 96*  CO2 34* 33*  GLUCOSE 100* 89  BUN 11 7  CREATININE 4.38* 3.41*  CALCIUM 9.2 8.9   Liver Function Tests:  Recent Labs Lab 11/18/15 0639  AST 19  ALT 14*  ALKPHOS 53  BILITOT 0.6  PROT 7.3  ALBUMIN 3.0*   No results for input(s): LIPASE, AMYLASE in the last 168 hours. CBC:  Recent Labs Lab 11/18/15 0639 11/18/15 1918 11/19/15 0525  WBC 4.1 5.6 4.3  HGB 6.9* 10.3* 10.0*  HCT 23.0* 31.9* 30.7*  MCV 100.9* 94.7 93.6  PLT 95* 96* 92*   Blood Culture    Component Value Date/Time   SDES BLOOD RIGHT HAND 09/13/2015 1243   SPECREQUEST BOTTLES DRAWN AEROBIC ONLY 5CC 09/13/2015 1243   CULT NO GROWTH 5 DAYS 09/13/2015 1243   REPTSTATUS 09/18/2015 FINAL 09/13/2015 1243    Cardiac Enzymes: No results for input(s):  CKTOTAL, CKMB, CKMBINDEX, TROPONINI in the last 168 hours. CBG:  Recent Labs Lab 11/18/15 0732  GLUCAP 88   Iron Studies:  Recent Labs  11/18/15 0955  IRON 40*  TIBC 141*  FERRITIN 1185*   @lablastinr3 @ Studies/Results: Dg Chest 2 View  11/18/2015  CLINICAL DATA:  69 year old hypertensive male with cough and altered mental status. Initial encounter. EXAM: CHEST  2 VIEW COMPARISON:  09/28/2015 chest x-ray and 09/13/2015 chest CT. FINDINGS: Cardiomegaly. Pulmonary vascular congestion Calcified mildly tortuous aorta. Right sided pleural effusion may reflect result of pulmonary edema with adjacent atelectasis. By plain film, cannot exclude right lower lobe infiltrate or mass.  Follow-up until clearance recommended. Ventriculoperitoneal shunt courses over the right thorax. IMPRESSION: Cardiomegaly. Pulmonary vascular congestion Right sided pleural effusion may reflect result of pulmonary edema with adjacent atelectasis. By plain film, cannot exclude right lower lobe infiltrate or mass. Follow-up until clearance recommended. Electronically Signed   By: [name]   On: 11/18/2015 07:28   Ct Head Wo Contrast  11/18/2015  CLINICAL DATA:  69 year old male with altered mental status. Hemodialysis. Legally blind. Initial encounter. EXAM: CT HEAD WITHOUT CONTRAST TECHNIQUE: Contiguous axial images were obtained from the base of the skull through the vertex without intravenous contrast. COMPARISON:  09/13/2015 head CT. FINDINGS: Shunt within right lateral ventricle unchanged in position without development of hydrocephalus or slit like appearance of the ventricles. Global atrophy. No acute intracranial hemorrhage. Remote infarcts posterior left frontal lobe, corona radiata bilaterally, left caudate head and right lenticular nucleus. Prominent white matter type changes consistent with result of small vessel disease. No CT evidence of large acute infarct although with the degree of white matter disease excluding small or medium size infarct difficult by CT. 6 mm right frontal calcified meningioma without associated mass effect stable in appearance. Vascular calcifications. Post lens replacement calcified on the left. Prominent mucosal thickening maxillary sinuses bilaterally and left sphenoid sinus. Opacification ethmoid sinus air cells bilaterally. IMPRESSION: Shunt within right lateral ventricle without hydrocephalus. Global atrophy. No acute intracranial hemorrhage. Remote infarcts posterior left frontal lobe, corona radiata bilaterally, left caudate head and right lenticular nucleus. Prominent small vessel disease. No CT evidence of large acute infarct. 6 mm right frontal calcified  meningioma without associated mass effect stable in appearance. Vascular calcifications. Prominent mucosal thickening maxillary sinuses bilaterally and left sphenoid sinus. Opacification ethmoid sinus air cells bilaterally. Electronically Signed   By: [name]   On: 11/18/2015 08:30   Portable Chest 1 View  11/19/2015  CLINICAL DATA:  Several day history of cough, onset of shortness of breath today. EXAM: PORTABLE CHEST 1 VIEW COMPARISON:  PA and lateral chest x-ray of November 18, 2015 FINDINGS: The lungs are adequately inflated. There is a smaller moderate-sized right pleural effusion layering posteriorly. Right basilar atelectasis or pneumonia is suspected. The left lung is clear though the interstitial markings are mildly increased. The cardiac silhouette remains enlarged. The central pulmonary vascularity is engorged. The ventriculoperitoneal shunt tube is unchanged in appearance. The bony thorax exhibits no acute abnormality. IMPRESSION: Stable moderate size right pleural effusion layering posteriorly. Right lower lobe atelectasis or pneumonia. Stable cardiomegaly with mild pulmonary vascular congestion. Ventriculoperitoneal shunt tube visible as well as a tube fragment project over the right hemithorax which are chronic findings. Electronically Signed   By: David  Martinique M.D.   On: 11/19/2015 07:27   Medications:   . sodium chloride   Intravenous Once  . sodium chloride   Intravenous Once  .  allopurinol  100 mg Oral Daily  . aspirin  81 mg Oral Daily  . carvedilol  12.5 mg Oral BID WC  . cefTAZidime (FORTAZ)  IV  2 g Intravenous Once  . darbepoetin (ARANESP) injection - DIALYSIS  150 mcg Intravenous Q Fri-HD  . doxercalciferol  1 mcg Intravenous Q M,W,F-HD  . ferric gluconate (FERRLECIT/NULECIT) IV  125 mg Intravenous Q M,W,F-HD  . pantoprazole (PROTONIX) IV  40 mg Intravenous Q12H  . tiotropium  18 mcg Inhalation q morning - 10a  . vancomycin  1,250 mg Intravenous Once

## 2015-11-19 NOTE — Progress Notes (Signed)
CRITICAL VALUE ALERT  Critical value received:  Lactic acid 2.4  Date of notification:  11/19/2015  Time of notification:  8:32 pm  Critical value read back:Yes.    Nurse who received alert:  Arlyss Queen  MD notified (1st page):  Rogue Bussing, NP  Time of first page:  8:33 pm

## 2015-11-19 NOTE — Clinical Documentation Improvement (Signed)
Internal Medicine  Can the diagnosis of anemia be further specified?   Acute Blood Loss Anemia, including the suspected or known cause or associated condition(s)  Acute on chronic blood loss anemia, including the suspected or known cause or associated condition(s)  Chronic blood loss anemia, including the suspected or known cause or associated condition(s)  Precipitous drop in Hematocrit, including the suspected or known cause or associated condition(s)  Other  Clinically Undetermined  Document any associated diagnoses/conditions.  Please update your documentation within the medical record to reflect your response to this query. Thank you.  Supporting Information: Component     Latest Ref Rng 11/18/2015 11/18/2015 11/19/2015         6:39 AM  7:18 PM   Hemoglobin     13.0 - 17.0 g/dL 6.9 (LL) 10.3 (L) 10.0 (L)  HCT     39.0 - 52.0 % 23.0 (L) 31.9 (L) 30.7 (L)   rec'd 2 units PRBCs   Please exercise your independent, professional judgment when responding. A specific answer is not anticipated or expected.  Thank You, Alessandra Grout, RN, BSN, CCDS,Clinical Documentation Specialist:  [phone]  [phone]=[employer]- Health Information Management

## 2015-11-20 DIAGNOSIS — J449 Chronic obstructive pulmonary disease, unspecified: Secondary | ICD-10-CM

## 2015-11-20 NOTE — Evaluation (Addendum)
Physical Therapy Evaluation Patient Details Name: RITVIK POPOCA MRN: [id number] DOB: 1946-05-15 Today's Date: 11/20/2015   History of Present Illness  Cameron Mendez is a 69 y.o. male with multiple medical problems including end-stage renal disease on hemodialysis Monday, Wednesday, Friday brought in altered mental status and productive cough since 2 weeks.. Found to have Acute respiratory failure with chronic right pleural effusion, with hypoxia, acute encephalopathy, 1.  Acute Blood loss anemia with heme positive stools, and acute on chronic diastolic heart failure.  Clinical Impression  Pt admitted with the above complications. Pt currently with functional limitations due to the deficits listed below (see PT Problem List). Very pleasant individual. Required min assist for walker guidance due to blindness. Some assist with sit<>stand transition. Delayed response with following multi-step commands and disoriented to month/year unless cued. Anticipate his mobility will progress quickly prior to d/c as wife states he is nearly at baseline which is independent with use of a cane for guidance. Pt will benefit from skilled PT to increase their independence and safety with mobility to allow discharge to the venue listed below.       Follow Up Recommendations Supervision/Assistance - 24 hour;No PT follow up - (May consider SNF vs HH Aide due to cognitive difficulties. Wife works during the day. Do not anticipate need for physical therapy as wife reports he is near baseline with mobility.)    Equipment Recommendations  None recommended by PT    Recommendations for Other Services OT consult     Precautions / Restrictions Precautions Precautions: Fall Restrictions Weight Bearing Restrictions: No      Mobility  Bed Mobility               General bed mobility comments: sitting in recliner  Transfers Overall transfer level: Needs assistance Equipment used: Rolling walker (2  wheeled) Transfers: Sit to/from Stand Sit to Stand: Min assist         General transfer comment: Performed twice from recliner. First attempt pt with loss of balance posteriorly requiring min assist to correct. Second attempt pt performed well without need for assist. VC for hand placement.  Ambulation/Gait Ambulation/Gait assistance: Min assist Ambulation Distance (Feet): 100 Feet Assistive device: Rolling walker (2 wheeled) Gait Pattern/deviations: Step-through pattern;Decreased stride length Gait velocity: decreased Gait velocity interpretation: Below normal speed for age/gender General Gait Details: Min assist for walker guidance due to blindness. Tolerated backwards stepping well without loss of balance. Cues for directions. Somewhat delayed with instructions but appropriately follows commands with increased time.  Stairs            Wheelchair Mobility    Modified Rankin (Stroke Patients Only)       Balance Overall balance assessment: Needs assistance Sitting-balance support: No upper extremity supported;Feet supported Sitting balance-Leahy Scale: Normal     Standing balance support: No upper extremity supported Standing balance-Leahy Scale: Fair                               Pertinent Vitals/Pain Pain Assessment: No/denies pain    Home Living Family/patient expects to be discharged to:: Private residence Living Arrangements: Spouse/significant other;Children Available Help at Discharge: Family;Available PRN/intermittently (wife works during day) Type of Home: House Home Access: Stairs to enter Entrance Stairs-Rails: None Technical brewer of Steps: 4 Home Layout: One level Home Equipment: Cane - single point;Walker - 2 wheels;Wheelchair - manual (guide stick)      Prior Function Level of  Independence: Independent with assistive device(s)         Comments: wife makes meals. Pt is guided by [facility] staff to bus for dialysis MWF. Pt  performs ADLs at home by himself, occasionally uses guide stick.     Hand Dominance   Dominant Hand: Right    Extremity/Trunk Assessment   Upper Extremity Assessment: Defer to OT evaluation           Lower Extremity Assessment: Generalized weakness         Communication   Communication: No difficulties  Cognition Arousal/Alertness: Awake/alert Behavior During Therapy: WFL for tasks assessed/performed Overall Cognitive Status: Impaired/Different from baseline Area of Impairment: Orientation;Problem solving;Following commands Orientation Level: Disoriented to;Time (November, needed cues to recall year)     Following Commands: Follows one step commands consistently;Follows multi-step commands with increased time     Problem Solving: Slow processing;Requires verbal cues      General Comments General comments (skin integrity, edema, etc.): Wife and sister present. Wife states pt is becoming more like his normal self but she still notices a cognitive change from baseline. Discussed d/c planning and role of PT while admitted in hospital.    Exercises        Assessment/Plan    PT Assessment Patient needs continued PT services  PT Diagnosis Difficulty walking;Generalized weakness   PT Problem List Decreased strength;Decreased activity tolerance;Decreased balance;Decreased mobility;Decreased knowledge of use of DME;Decreased safety awareness  PT Treatment Interventions DME instruction;Gait training;Functional mobility training;Therapeutic exercise;Therapeutic activities;Balance training;Patient/family education   PT Goals (Current goals can be found in the Care Plan section) Acute Rehab PT Goals Patient Stated Goal: Go home PT Goal Formulation: With patient/family Time For Goal Achievement: 12/04/15 Potential to Achieve Goals: Good    Frequency Min 3X/week   Barriers to discharge Decreased caregiver support wife works during the day    Co-evaluation                End of Session   Activity Tolerance: Patient tolerated treatment well Patient left: in chair;with call bell/phone within reach;with chair alarm set;with family/visitor present Nurse Communication: Mobility status (NT)         Time: [id number] PT Time Calculation (min) (ACUTE ONLY): 18 min   Charges:   PT Evaluation $Initial PT Evaluation Tier I: 1 Procedure     PT G CodesEllouise Newer 11/20/2015, 3:59 PM Camille Bal Blevins, [place]

## 2015-11-20 NOTE — Progress Notes (Signed)
[facility] Progress Note  Assessment/Plan: 1. Pulmonary edema- ho Diastolic Dysfunction. CXR today shows RLL aatelectasis VS pneumonia. Mild pulmonary congestion. Is under dry weight, no fever/ WBC, suspect vol overload from lean body wt loss. HD Monday, cont to lower volume as tolerated and lower dry at dc. 2. ? HCAP: per primary. On vanc & Zosyn. No fevers.  3. Chronic GI Bleed- no hep. HD , HGB 11.2. PPI per primary.  4. AMS / confusion: Per primary.  Much improved today. Knows who I am, know he is at hospital, confused about date but pt is blind.  5. ESRD - HD schedule MWF ([facility])HD 11/20/15 Will need again 11/22/15. K+4.1  6. Hypertension/volume-HD yesterday Net UF 1579  post wt 58. Had some cramping yesterday, rec'd IVF while on treatment  7. 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 rec'd 2 units PRBCs yesterday. HGB 11.2 11/19/15 Monitor HGB. ESA and Fe 11/19/15 8. Metabolic bone disease -C Ca 9.7. [name] on hold. Will resume later at lower dose. 9. DM Per primary;  10. Blind: staff aware.  11. Thrombocytopenia: Per primary. Platelets 93 11/19/15.  12.      Acute on chronic Diastolic HF: EF 123456 per echo 09/28/15. On Coreg.   Rita H. Brown NP-C 11/20/2015, 10:38 AM  [facility] [phone]  Pt seen, examined, agree w assess/plan as above with additions as indicated.  Kelly Splinter MD [facility] pager [phone]    cell [phone] 11/20/2015, 1:04 PM     Subjective: "I feel all right" Up in chair. On RA. Without WOB.   Objective Filed Vitals:   11/19/15 1722 11/19/15 2018 11/20/15 0447 11/20/15 0854  BP: 138/54 110/30 141/45 138/53  Pulse: 76 68 69 75  Temp: 96.6 F (35.9 C) 98.7 F (37.1 C) 98.4 F (36.9 C) 98.2 F (36.8 C)  TempSrc: Oral Oral Oral Oral  Resp: 16 18 19 18   Height:      Weight:  58.2 kg (128 lb 4.9 oz)    SpO2:  96% 95% 99%   Physical Exam General: Elderly frail  appearing male NAD Heart: S1, S2, RRR II/VI systolic M. No G/R/ SR with pacs on monitor.  Lungs: Bilateral breath sounds, decreased in bases R>L. On O2. Abdomen: soft nontender, hypoactive BS Extremities: No LE edema. Healing abrasion L knee.  Skin: Warm, very dry, no cyanosis.  Dialysis Access: LUA AVF + thrill/+ bruit  Dialysis Orders: MonWedFri, 4 hrs 0 min, 180NRe Optiflux, BFR 400, DFR Autoflow 1.5, EDW 62 (kg), Dialysate 2.0 K, 2.25 Ca, UFR Profile: Profile 4, Sodium Model: Linear, Access: LUA AV Fistula Heparin: No heparin Mircera: 225 mcg IV q 2 weeks (11/12/15) [name]: 7 mcg IV q MWF  Additional Objective Labs: Basic Metabolic Panel:  Recent Labs Lab 11/18/15 0639 11/19/15 0525 11/19/15 1932  NA 141 136 138  K 3.4* 3.7 4.1  CL 98* 96* 96*  CO2 34* 33* 31  GLUCOSE 100* 89 112*  BUN 11 7 <5*  CREATININE 4.38* 3.41* 2.45*  CALCIUM 9.2 8.9 9.1  PHOS  --   --  1.8*   Liver Function Tests:  Recent Labs Lab 11/18/15 0639 11/19/15 1932  AST 19  --   ALT 14*  --   ALKPHOS 53  --   BILITOT 0.6  --   PROT 7.3  --   ALBUMIN 3.0* 3.0*   No results for input(s): LIPASE, AMYLASE in the last 168 hours. CBC:  Recent  Labs Lab 11/18/15 0639 11/18/15 1918 11/19/15 0525 11/19/15 1932  WBC 4.1 5.6 4.3 5.9  HGB 6.9* 10.3* 10.0* 11.2*  HCT 23.0* 31.9* 30.7* 35.2*  MCV 100.9* 94.7 93.6 94.4  PLT 95* 96* 92* 93*   Blood Culture    Component Value Date/Time   SDES BLOOD RIGHT HAND 09/13/2015 1243   SPECREQUEST BOTTLES DRAWN AEROBIC ONLY 5CC 09/13/2015 1243   CULT NO GROWTH 5 DAYS 09/13/2015 1243   REPTSTATUS 09/18/2015 FINAL 09/13/2015 1243    Cardiac Enzymes: No results for input(s): CKTOTAL, CKMB, CKMBINDEX, TROPONINI in the last 168 hours. CBG:  Recent Labs Lab 11/18/15 0732  GLUCAP 88   Iron Studies:  Recent Labs  11/18/15 0955  IRON 40*  TIBC 141*  FERRITIN 1185*   @lablastinr3 @ Studies/Results: Portable Chest 1 View  11/19/2015  CLINICAL  DATA:  Several day history of cough, onset of shortness of breath today. EXAM: PORTABLE CHEST 1 VIEW COMPARISON:  PA and lateral chest x-ray of November 18, 2015 FINDINGS: The lungs are adequately inflated. There is a smaller moderate-sized right pleural effusion layering posteriorly. Right basilar atelectasis or pneumonia is suspected. The left lung is clear though the interstitial markings are mildly increased. The cardiac silhouette remains enlarged. The central pulmonary vascularity is engorged. The ventriculoperitoneal shunt tube is unchanged in appearance. The bony thorax exhibits no acute abnormality. IMPRESSION: Stable moderate size right pleural effusion layering posteriorly. Right lower lobe atelectasis or pneumonia. Stable cardiomegaly with mild pulmonary vascular congestion. Ventriculoperitoneal shunt tube visible as well as a tube fragment project over the right hemithorax which are chronic findings. Electronically Signed   By: David  Martinique M.D.   On: 11/19/2015 07:27   Medications:   . sodium chloride   Intravenous Once  . sodium chloride   Intravenous Once  . allopurinol  100 mg Oral Daily  . aspirin  81 mg Oral Daily  . carvedilol  12.5 mg Oral BID WC  . [START ON 11/22/2015] cefTAZidime (FORTAZ)  IV  2 g Intravenous Q M,W,F-HD  . darbepoetin (ARANESP) injection - DIALYSIS  150 mcg Intravenous Q Fri-HD  . doxercalciferol  1 mcg Intravenous Q M,W,F-HD  . ferric gluconate (FERRLECIT/NULECIT) IV  125 mg Intravenous Q M,W,F-HD  . pantoprazole  40 mg Oral Q0600  . tiotropium  18 mcg Inhalation q morning - 10a  . [START ON 11/22/2015] vancomycin  500 mg Intravenous Q M,W,F-HD

## 2015-11-20 NOTE — Progress Notes (Signed)
[facility] PROGRESS NOTE  Cameron Mendez [id number] DOB: [birth year] DOA: 11/18/2015 PCP: [name], MD Interim summary: Cameron Mendez is a 69 y.o. male with multiple medical problems including end-stage renal disease on hemodialysis Monday, Wednesday, Friday  brought in altered mental status and productive cough since 2 weeks. On arrival to ED, he was found to be anemic with hemoglobin of 6.9, heme positive stools, fluid overloaded and encephalopathic. He was admitted to medical service for further evaluation.  His CXR showed chronic right pleural effusion and possible pneumonia, and he is hypoxic requiring 2 lit [place] oxygen. He was started on broad spectrum antibiotics for HCAP. He was also given 2 units of prbc transfusion and gastroenterology consulted for recommendation. Of note he underwent EGD AND COLONOSCOPY in 3/16 showing duodenitis and colon diverticulosis and colon polyps.   Assessment/Plan: 1. Acute Blood loss anemia with heme positive stools: - possibly chronic blood loss from diverticulosis.  - resume PPI, and keep hemoglobin greater than 7.  - further management as per gastroenterology.  - outpatient capsule endoscopy for further evaluation with [place] gastroenterology.   2. ESRD: on HD MWF - further management as per renal.  - iron infusions at the time HD.  3. Acute respiratory failure with chronic right pleural effusion, with hypoxia requiring 2l it of [place] oxygen: -suspect pneumonia , started on broad spectrum antibiotics.  - weaned off the oxygen.    4. Acute encephalopathy: probably from the pneumonia.  Initial CT head negative for  Acute stroke or bleed.  His confusion has improved he is oriented to person an dplace.    5. Chronic thrombocytopenia: - stable counts.   6. Acute on chronic diastolic heart failure: CXR with pulm edema, and he had HD on 12/8 and will get HD on 12/9 as per RENAL.  Follow up CXR in am.   7. Paroxysmal atrial  fibrillation: currently in sinus.  Resume home meds.   Code Status: full code.  Family Communication: discussed with wife over the phone.  Disposition Plan: possible 2 to 3 day pending further investigation and PT eval.   Consultants: Renal  Gastroenterology.  Procedures: HD MWF. Antibiotics:  Vancomycin   fortaz  HPI/Subjective: Confused, no complaints.   Objective: Filed Vitals:   11/20/15 0447 11/20/15 0854  BP: 141/45 138/53  Pulse: 69 75  Temp: 98.4 F (36.9 C) 98.2 F (36.8 C)  Resp: 19 18    Intake/Output Summary (Last 24 hours) at 11/20/15 1230 Last data filed at 11/20/15 1013  Gross per 24 hour  Intake    360 ml  Output   2959 ml  Net  -2599 ml   Filed Weights   11/19/15 1225 11/19/15 1618 11/19/15 2018  Weight: 58.2 kg (128 lb 4.9 oz) 58 kg (127 lb 13.9 oz) 58.2 kg (128 lb 4.9 oz)    Exam:   General:  Arousable, mumbling, repeated his name, but confused  Not in any distress  Cardiovascular: s1s2  Respiratory: diminished at bases, more on the right  Abdomen: soft non tender non distended bowel sounds heard  Musculoskeletal: no pedal edema.   Data Reviewed: Basic Metabolic Panel:  Recent Labs Lab 11/18/15 0639 11/19/15 0525 11/19/15 1932  NA 141 136 138  K 3.4* 3.7 4.1  CL 98* 96* 96*  CO2 34* 33* 31  GLUCOSE 100* 89 112*  BUN 11 7 <5*  CREATININE 4.38* 3.41* 2.45*  CALCIUM 9.2 8.9 9.1  PHOS  --   --  1.8*  Liver Function Tests:  Recent Labs Lab 11/18/15 0639 11/19/15 1932  AST 19  --   ALT 14*  --   ALKPHOS 53  --   BILITOT 0.6  --   PROT 7.3  --   ALBUMIN 3.0* 3.0*   No results for input(s): LIPASE, AMYLASE in the last 168 hours. No results for input(s): AMMONIA in the last 168 hours. CBC:  Recent Labs Lab 11/18/15 0639 11/18/15 1918 11/19/15 0525 11/19/15 1932  WBC 4.1 5.6 4.3 5.9  HGB 6.9* 10.3* 10.0* 11.2*  HCT 23.0* 31.9* 30.7* 35.2*  MCV 100.9* 94.7 93.6 94.4  PLT 95* 96* 92* 93*   Cardiac  Enzymes: No results for input(s): CKTOTAL, CKMB, CKMBINDEX, TROPONINI in the last 168 hours. BNP (last 3 results)  Recent Labs  09/13/15 0657 09/28/15 1842 11/18/15 0639  BNP 1766.2* 764.8* 2264.3*    ProBNP (last 3 results) No results for input(s): PROBNP in the last 8760 hours.  CBG:  Recent Labs Lab 11/18/15 0732  GLUCAP 88    No results found for this or any previous visit (from the past 240 hour(s)).   Studies: Portable Chest 1 View  11/19/2015  CLINICAL DATA:  Several day history of cough, onset of shortness of breath today. EXAM: PORTABLE CHEST 1 VIEW COMPARISON:  PA and lateral chest x-ray of November 18, 2015 FINDINGS: The lungs are adequately inflated. There is a smaller moderate-sized right pleural effusion layering posteriorly. Right basilar atelectasis or pneumonia is suspected. The left lung is clear though the interstitial markings are mildly increased. The cardiac silhouette remains enlarged. The central pulmonary vascularity is engorged. The ventriculoperitoneal shunt tube is unchanged in appearance. The bony thorax exhibits no acute abnormality. IMPRESSION: Stable moderate size right pleural effusion layering posteriorly. Right lower lobe atelectasis or pneumonia. Stable cardiomegaly with mild pulmonary vascular congestion. Ventriculoperitoneal shunt tube visible as well as a tube fragment project over the right hemithorax which are chronic findings. Electronically Signed   By: David  Martinique M.D.   On: 11/19/2015 07:27    Scheduled Meds: . sodium chloride   Intravenous Once  . sodium chloride   Intravenous Once  . allopurinol  100 mg Oral Daily  . aspirin  81 mg Oral Daily  . carvedilol  12.5 mg Oral BID WC  . [START ON 11/22/2015] cefTAZidime (FORTAZ)  IV  2 g Intravenous Q M,W,F-HD  . darbepoetin (ARANESP) injection - DIALYSIS  150 mcg Intravenous Q Fri-HD  . doxercalciferol  1 mcg Intravenous Q M,W,F-HD  . ferric gluconate (FERRLECIT/NULECIT) IV  125 mg  Intravenous Q M,W,F-HD  . pantoprazole  40 mg Oral Q0600  . tiotropium  18 mcg Inhalation q morning - 10a  . 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     [facility]  [facility] Pager [phone]  If 7PM-7AM, please contact night-coverage at [email or web address], password [facility] 11/20/2015, 12:30 PM  LOS: 2 days

## 2015-11-21 ENCOUNTER — Inpatient Hospital Stay (HOSPITAL_COMMUNITY): Payer: Medicare Other

## 2015-11-21 DIAGNOSIS — D649 Anemia, unspecified: Secondary | ICD-10-CM

## 2015-11-21 LAB — CBC
HCT: 32.5 % — ABNORMAL LOW (ref 39.0–52.0)
HEMOGLOBIN: 10.5 g/dL — AB (ref 13.0–17.0)
MCH: 30.3 pg (ref 26.0–34.0)
MCHC: 32.3 g/dL (ref 30.0–36.0)
MCV: 93.9 fL (ref 78.0–100.0)
PLATELETS: 86 10*3/uL — AB (ref 150–400)
RBC: 3.46 MIL/uL — AB (ref 4.22–5.81)
RDW: 17.2 % — ABNORMAL HIGH (ref 11.5–15.5)
WBC: 4.7 10*3/uL (ref 4.0–10.5)

## 2015-11-21 MED ORDER — VITAMIN B-12 1000 MCG PO TABS
1000.0000 ug | ORAL_TABLET | Freq: Every day | ORAL | Status: DC
  Administered 2015-11-21 – 2015-11-22 (×2): 1000 ug via ORAL
  Filled 2015-11-21: qty 1

## 2015-11-21 NOTE — Progress Notes (Signed)
[facility] Progress Note  Assessment: 1. SOB - PNA and/or pulm edema. Trying to lower dry weight. On abx, much better. 2. Chronic GI Bleed- no hep. HD , HGB 11.2. PPI per primary.  3. AMS - back to baseline 4. ESRD HD MWF  5. Volume - try to lower dry at dc 6. 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 rec'd 2 units PRBCs 12/9. ESA and Fe 11/19/15 7. Metabolic bone disease -C Ca 9.7. [name] on hold. Will resume later at lower dose. 8. DM Per primary;  9. Blind: staff aware.  10. Thrombocytopenia: Per primary. Platelets 93 11/19/15.  12.      Acute on chronic Diastolic HF: EF 123456 per echo 09/28/15. On Coreg.   Plan - HD Monday, UF 2- 3 kg.    Kelly Splinter MD [facility] pager [phone]    cell [phone] 11/21/2015, 12:35 PM     Subjective: "I feel all right" Up in chair. On RA. Without WOB.   Objective Filed Vitals:   11/20/15 2006 11/20/15 2125 11/21/15 0526 11/21/15 1025  BP: 122/64 144/51 136/46 154/79  Pulse: 94 46 72 73  Temp: 98.7 F (37.1 C) 98.9 F (37.2 C) 97.8 F (36.6 C) 98.2 F (36.8 C)  TempSrc: Oral Oral Oral Oral  Resp: 16 18 16 18   Height:      Weight: 58 kg (127 lb 13.9 oz) 61.7 kg (136 lb 0.4 oz)    SpO2: 96% 99% 97% 100%   Physical Exam General: Elderly frail appearing male NAD Heart: S1, S2, RRR II/VI systolic M. No G/R/ SR with pacs on monitor.  Lungs: Bilateral breath sounds, decreased in bases R>L. On O2. Abdomen: soft nontender, hypoactive BS Extremities: No LE edema. Healing abrasion L knee.  Skin: Warm, very dry, no cyanosis.  Dialysis Access: LUA AVF + thrill/+ bruit  Dialysis Orders: MonWedFri, 4 hrs 0 min, 180NRe Optiflux, BFR 400, DFR Autoflow 1.5, EDW 62 (kg), Dialysate 2.0 K, 2.25 Ca, UFR Profile: Profile 4, Sodium Model: Linear, Access: LUA AV Fistula Heparin: No heparin Mircera: 225 mcg IV q 2 weeks (11/12/15) [name]: 7 mcg IV q MWF  Additional  Objective Labs: Basic Metabolic Panel:  Recent Labs Lab 11/18/15 0639 11/19/15 0525 11/19/15 1932  NA 141 136 138  K 3.4* 3.7 4.1  CL 98* 96* 96*  CO2 34* 33* 31  GLUCOSE 100* 89 112*  BUN 11 7 <5*  CREATININE 4.38* 3.41* 2.45*  CALCIUM 9.2 8.9 9.1  PHOS  --   --  1.8*   Liver Function Tests:  Recent Labs Lab 11/18/15 0639 11/19/15 1932  AST 19  --   ALT 14*  --   ALKPHOS 53  --   BILITOT 0.6  --   PROT 7.3  --   ALBUMIN 3.0* 3.0*   No results for input(s): LIPASE, AMYLASE in the last 168 hours. CBC:  Recent Labs Lab 11/18/15 0639 11/18/15 1918 11/19/15 0525 11/19/15 1932 11/21/15 0450  WBC 4.1 5.6 4.3 5.9 4.7  HGB 6.9* 10.3* 10.0* 11.2* 10.5*  HCT 23.0* 31.9* 30.7* 35.2* 32.5*  MCV 100.9* 94.7 93.6 94.4 93.9  PLT 95* 96* 92* 93* 86*   Blood Culture    Component Value Date/Time   SDES BLOOD RIGHT HAND 09/13/2015 1243   SPECREQUEST BOTTLES DRAWN AEROBIC ONLY 5CC 09/13/2015 1243   CULT NO GROWTH 5 DAYS 09/13/2015 1243   REPTSTATUS 09/18/2015 FINAL 09/13/2015 1243    Cardiac Enzymes:  No results for input(s): CKTOTAL, CKMB, CKMBINDEX, TROPONINI in the last 168 hours. CBG:  Recent Labs Lab 11/18/15 0732  GLUCAP 88   Iron Studies: No results for input(s): IRON, TIBC, TRANSFERRIN, FERRITIN in the last 72 hours. @lablastinr3 @ Studies/Results: Dg Chest 2 View  11/21/2015  CLINICAL DATA:  Shortness of breath and cough ; right pleural effusion EXAM: CHEST  2 VIEW COMPARISON:  November 19, 2015 FINDINGS: There is persistent right pleural effusion with right base atelectatic change, stable. Lungs elsewhere clear. Heart is mildly enlarged with pulmonary vascularity within normal limits. Shunt catheter along the right hemithorax is present. A portion of a second catheter on the right is stable as well. No adenopathy. No bone lesions. There is extensive atherosclerotic calcification. IMPRESSION: Stable right effusion with right base atelectasis. Lungs elsewhere  clear. Stable cardiac prominence. Electronically Signed   By: Lowella Grip III M.D.   On: 11/21/2015 08:15   Medications:   . sodium chloride   Intravenous Once  . sodium chloride   Intravenous Once  . allopurinol  100 mg Oral Daily  . aspirin  81 mg Oral Daily  . carvedilol  12.5 mg Oral BID WC  . [START ON 11/22/2015] cefTAZidime (FORTAZ)  IV  2 g Intravenous Q M,W,F-HD  . darbepoetin (ARANESP) injection - DIALYSIS  150 mcg Intravenous Q Fri-HD  . doxercalciferol  1 mcg Intravenous Q M,W,F-HD  . ferric gluconate (FERRLECIT/NULECIT) IV  125 mg Intravenous Q M,W,F-HD  . pantoprazole  40 mg Oral Q0600  . tiotropium  18 mcg Inhalation q morning - 10a  . [START ON 11/22/2015] vancomycin  500 mg Intravenous Q M,W,F-HD

## 2015-11-21 NOTE — Progress Notes (Signed)
Patient NSL out. Bleeding site, catheter not found. Dr. Karleen Hampshire notified about need for restart. IV antibiotics to be given on Hemodialysis. Ok to leave IV out. Will continue to monitor. Bartholomew Crews, RN

## 2015-11-21 NOTE — Progress Notes (Signed)
[facility] PROGRESS NOTE  Cameron Mendez [id number] DOB: 1946/11/02 DOA: 11/18/2015 PCP: [name], MD Interim summary: Cameron Mendez is a 69 y.o. male with multiple medical problems including end-stage renal disease on hemodialysis Monday, Wednesday, Friday  brought in altered mental status and productive cough since 2 weeks. On arrival to ED, he was found to be anemic with hemoglobin of 6.9, heme positive stools, fluid overloaded and encephalopathic. He was admitted to medical service for further evaluation.  His CXR showed chronic right pleural effusion and possible pneumonia, and he is hypoxic requiring 2 lit [place] oxygen. He was started on broad spectrum antibiotics for HCAP. He was also given 2 units of prbc transfusion and gastroenterology consulted for recommendation. Of note he underwent EGD AND COLONOSCOPY in 3/16 showing duodenitis and colon diverticulosis and colon polyps.   Assessment/Plan: 1. Acute Blood loss anemia with heme positive stools: - possibly chronic blood loss from diverticulosis.  - resume PPI, and keep hemoglobin greater than 7.  - further management as per gastroenterology.  - outpatient capsule endoscopy for further evaluation with [place] gastroenterology.   2. ESRD: on HD MWF - further management as per renal.  - iron infusions at the time HD.  3. Acute respiratory failure with chronic right pleural effusion, with hypoxia requiring 2l it of  oxygen: -suspect pneumonia , started on broad spectrum antibiotics.  - weaned off the oxygen.  - plan to change to oral antibiotics in am.  - repeat cxr shows chronic pleural effusion without any evidence of consolidation.    4. Acute encephalopathy: probably from the pneumonia.  Initial CT head negative for  Acute stroke or bleed.  His confusion has improved he is oriented to person an dplace.  MRI brain cancelled.    5. Chronic thrombocytopenia: - stable counts.   6. Acute on chronic diastolic  heart failure: CXR with pulm edema, and he had HD on 12/8 and will get HD on 12/9 as per RENAL.  Follow up CXR in am shows improvement.  7. Paroxysmal atrial fibrillation: currently in sinus.  Resume home meds.   Code Status: full code.  Family Communication: discussed with wife over the phone.  Disposition Plan: home in am after HD treatment.   Consultants: Renal  Gastroenterology.  Procedures: HD MWF. Antibiotics:  Vancomycin   fortaz  HPI/Subjective: No new complaints.   Objective: Filed Vitals:   11/21/15 0526 11/21/15 1025  BP: 136/46 154/79  Pulse: 72 73  Temp: 97.8 F (36.6 C) 98.2 F (36.8 C)  Resp: 16 18    Intake/Output Summary (Last 24 hours) at 11/21/15 1649 Last data filed at 11/21/15 1231  Gross per 24 hour  Intake    720 ml  Output      0 ml  Net    720 ml   Filed Weights   11/19/15 2018 11/20/15 2006 11/20/15 2125  Weight: 58.2 kg (128 lb 4.9 oz) 58 kg (127 lb 13.9 oz) 61.7 kg (136 lb 0.4 oz)    Exam:   General:  Arousable, mumbling, repeated his name, but confused  Not in any distress  Cardiovascular: s1s2  Respiratory: diminished at bases, more on the right  Abdomen: soft non tender non distended bowel sounds heard  Musculoskeletal: no pedal edema.   Data Reviewed: Basic Metabolic Panel:  Recent Labs Lab 11/18/15 0639 11/19/15 0525 11/19/15 1932  NA 141 136 138  K 3.4* 3.7 4.1  CL 98* 96* 96*  CO2 34* 33* 31  GLUCOSE 100* 89 112*  BUN 11 7 <5*  CREATININE 4.38* 3.41* 2.45*  CALCIUM 9.2 8.9 9.1  PHOS  --   --  1.8*   Liver Function Tests:  Recent Labs Lab 11/18/15 0639 11/19/15 1932  AST 19  --   ALT 14*  --   ALKPHOS 53  --   BILITOT 0.6  --   PROT 7.3  --   ALBUMIN 3.0* 3.0*   No results for input(s): LIPASE, AMYLASE in the last 168 hours. No results for input(s): AMMONIA in the last 168 hours. CBC:  Recent Labs Lab 11/18/15 0639 11/18/15 1918 11/19/15 0525 11/19/15 1932 11/21/15 0450  WBC 4.1  5.6 4.3 5.9 4.7  HGB 6.9* 10.3* 10.0* 11.2* 10.5*  HCT 23.0* 31.9* 30.7* 35.2* 32.5*  MCV 100.9* 94.7 93.6 94.4 93.9  PLT 95* 96* 92* 93* 86*   Cardiac Enzymes: No results for input(s): CKTOTAL, CKMB, CKMBINDEX, TROPONINI in the last 168 hours. BNP (last 3 results)  Recent Labs  09/13/15 0657 09/28/15 1842 11/18/15 0639  BNP 1766.2* 764.8* 2264.3*    ProBNP (last 3 results) No results for input(s): PROBNP in the last 8760 hours.  CBG:  Recent Labs Lab 11/18/15 0732  GLUCAP 88    No results found for this or any previous visit (from the past 240 hour(s)).   Studies: Dg Chest 2 View  11/21/2015  CLINICAL DATA:  Shortness of breath and cough ; right pleural effusion EXAM: CHEST  2 VIEW COMPARISON:  November 19, 2015 FINDINGS: There is persistent right pleural effusion with right base atelectatic change, stable. Lungs elsewhere clear. Heart is mildly enlarged with pulmonary vascularity within normal limits. Shunt catheter along the right hemithorax is present. A portion of a second catheter on the right is stable as well. No adenopathy. No bone lesions. There is extensive atherosclerotic calcification. IMPRESSION: Stable right effusion with right base atelectasis. Lungs elsewhere clear. Stable cardiac prominence. Electronically Signed   By: Lowella Grip III M.D.   On: 11/21/2015 08:15    Scheduled Meds: . sodium chloride   Intravenous Once  . sodium chloride   Intravenous Once  . allopurinol  100 mg Oral Daily  . aspirin  81 mg Oral Daily  . carvedilol  12.5 mg Oral BID WC  . [START ON 11/22/2015] cefTAZidime (FORTAZ)  IV  2 g Intravenous Q M,W,F-HD  . darbepoetin (ARANESP) injection - DIALYSIS  150 mcg Intravenous Q Fri-HD  . doxercalciferol  1 mcg Intravenous Q M,W,F-HD  . ferric gluconate (FERRLECIT/NULECIT) IV  125 mg Intravenous Q M,W,F-HD  . pantoprazole  40 mg Oral Q0600  . tiotropium  18 mcg Inhalation q morning - 10a  . 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     [facility]  [facility] Pager [phone]  If 7PM-7AM, please contact night-coverage at [email or web address], password [facility] 11/21/2015, 4:49 PM  LOS: 3 days

## 2015-11-22 LAB — BASIC METABOLIC PANEL WITH GFR
Anion gap: 14 (ref 5–15)
BUN: 36 mg/dL — ABNORMAL HIGH (ref 6–20)
CO2: 25 mmol/L (ref 22–32)
Calcium: 9.2 mg/dL (ref 8.9–10.3)
Chloride: 95 mmol/L — ABNORMAL LOW (ref 101–111)
Creatinine, Ser: 8.42 mg/dL — ABNORMAL HIGH (ref 0.61–1.24)
GFR calc Af Amer: 7 mL/min — ABNORMAL LOW
GFR calc non Af Amer: 6 mL/min — ABNORMAL LOW
Glucose, Bld: 98 mg/dL (ref 65–99)
Potassium: 3.6 mmol/L (ref 3.5–5.1)
Sodium: 134 mmol/L — ABNORMAL LOW (ref 135–145)

## 2015-11-22 LAB — CBC
HEMATOCRIT: 31.1 % — AB (ref 39.0–52.0)
Hemoglobin: 9.6 g/dL — ABNORMAL LOW (ref 13.0–17.0)
MCH: 28.9 pg (ref 26.0–34.0)
MCHC: 30.9 g/dL (ref 30.0–36.0)
MCV: 93.7 fL (ref 78.0–100.0)
PLATELETS: 77 10*3/uL — AB (ref 150–400)
RBC: 3.32 MIL/uL — ABNORMAL LOW (ref 4.22–5.81)
RDW: 16.9 % — AB (ref 11.5–15.5)
WBC: 4.8 10*3/uL (ref 4.0–10.5)

## 2015-11-22 LAB — LACTIC ACID, PLASMA: Lactic Acid, Venous: 1.3 mmol/L (ref 0.5–2.0)

## 2015-11-22 MED ORDER — SODIUM CHLORIDE 0.9 % IV SOLN: 100.0000 mL | INTRAVENOUS | Status: DC | PRN

## 2015-11-22 MED ORDER — DOXERCALCIFEROL 4 MCG/2ML IV SOLN
INTRAVENOUS | Status: AC
  Filled 2015-11-22: qty 2

## 2015-11-22 MED ORDER — LEVOFLOXACIN 750 MG PO TABS: 750.0000 mg | ORAL_TABLET | ORAL | Status: DC

## 2015-11-22 MED ORDER — CYANOCOBALAMIN 1000 MCG PO TABS: 1000.0000 ug | ORAL_TABLET | Freq: Every day | ORAL | Status: DC

## 2015-11-22 NOTE — Progress Notes (Signed)
PT Cancellation Note  Patient Details Name: SHAE DOO MRN: [id number] DOB: 10/14/1946   Cancelled Treatment:    Reason Eval/Treat Not Completed: Patient at procedure or test/unavailable Pt in HD. PT to return as able.   Kingsley Callander 11/22/2015, 10:09 AM   Kittie Plater, PT, DPT Pager #: [phone] Office #: [phone]

## 2015-11-22 NOTE — Progress Notes (Addendum)
ANTIBIOTIC CONSULT NOTE - FOLLOW UP  Pharmacy Consult for levaquin Indication: PNA  Allergies  Allergen Reactions  . Ibuprofen Other (See Comments)    Kidney problems-dialysis patient  . Nsaids Other (See Comments)    Kidney problems-dialysis patient    Patient Measurements: Height: 5\' 3"  (160 cm) Weight: 128 lb 4.9 oz (58.2 kg) (Standing scale) IBW/kg (Calculated) : 56.9   Vital Signs: Temp: 97.4 F (36.3 C) (12/12 0803) Temp Source: Oral (12/12 0803) BP: 102/31 mmHg (12/12 1017) Pulse Rate: 67 (12/12 1017) Intake/Output from previous day: 12/11 0701 - 12/12 0700 In: 820 [P.O.:820] Out: 1 [Stool:1] Intake/Output from this shift:    Labs:  Recent Labs  11/19/15 1932 11/21/15 0450 11/22/15 0710  WBC 5.9 4.7 4.8  HGB 11.2* 10.5* 9.6*  PLT 93* 86* 77*  CREATININE 2.45*  --  8.42*   Estimated Creatinine Clearance: 6.7 mL/min (by C-G formula based on Cr of 8.42). No results for input(s): VANCOTROUGH, VANCOPEAK, VANCORANDOM, GENTTROUGH, GENTPEAK, GENTRANDOM, TOBRATROUGH, TOBRAPEAK, TOBRARND, AMIKACINPEAK, AMIKACINTROU, AMIKACIN in the last 72 hours.   Microbiology: No results found for this or any previous visit (from the past 720 hour(s)).  Anti-infectives    Start     Dose/Rate Route Frequency Ordered Stop   11/22/15 1200  vancomycin (VANCOCIN) 500 mg in sodium chloride 0.9 % 100 mL IVPB  Status:  Discontinued     500 mg 100 mL/hr over 60 Minutes Intravenous Every M-W-F (Hemodialysis) 11/19/15 1413 11/22/15 1020   11/22/15 1200  cefTAZidime (FORTAZ) 2 g in dextrose 5 % 50 mL IVPB     2 g 100 mL/hr over 30 Minutes Intravenous Every M-W-F (Hemodialysis) 11/19/15 1413     11/19/15 1630  vancomycin (VANCOCIN) 1,250 mg in sodium chloride 0.9 % 250 mL IVPB     1,250 mg 166.7 mL/hr over 90 Minutes Intravenous  Once 11/19/15 1432 11/19/15 1646   11/19/15 1100  vancomycin (VANCOCIN) 1,250 mg in sodium chloride 0.9 % 250 mL IVPB  Status:  Discontinued     1,250  mg 166.7 mL/hr over 90 Minutes Intravenous  Once 11/19/15 1055 11/19/15 1432   11/19/15 1100  cefTAZidime (FORTAZ) 2 g in dextrose 5 % 50 mL IVPB     2 g 100 mL/hr over 30 Minutes Intravenous  Once 11/19/15 1055 11/19/15 1652      Assessment: 69 yo male with PNA on vancomycin and fortaz and pharmacy asked to transition to levaquin. WBC= 4.8, afebrile, SCr= 8.42 (noted with ESRD on HD MWF)  Vanc 12/9>> 12/12 Tressie Ellis 12/9>> 12/12  Plan:  -Levaquin 750mg  po q48hr -Will sign off. Please contact pharmacy with any other needs  Thank you,  Hildred Laser, Pharm D 11/22/2015 10:25 AM

## 2015-11-22 NOTE — Progress Notes (Signed)
[facility] Progress Note  Assessment: 1. SOB - PNA +/- pulm edema. 4kg under prior dry wt. On abx, much better. 2. Chronic GI Bleed- no hep. HD , HGB 11.2. PPI per primary.  3. AMS - back to baseline 4. ESRD HD MWF  5. Volume - as above 6. 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 rec'd 2 units PRBCs 12/9. ESA and Fe 11/19/15 7. Metabolic bone disease -C Ca 9.7. [name] on hold. Will resume later at lower dose. 8. DM Per primary;  9. Blind: staff aware.  10. Thrombocytopenia: Per primary. Platelets 93 11/19/15.  12.      Acute on chronic Diastolic HF: EF [id number] per echo 09/28/15. On Coreg.   Plan - HD Monday, min UF. OK for dc from renal standpoint.    Kelly Splinter MD [facility] pager [phone]    cell [phone] 11/22/2015, 10:22 AM     Subjective: "I feel all right" Up in chair. On RA. Without WOB.   Objective Filed Vitals:   11/22/15 0900 11/22/15 0935 11/22/15 1003 11/22/15 1017  BP: 123/39 114/57 93/40 102/31  Pulse: 65 69 72 67  Temp:      TempSrc:      Resp: 17 13 18 20   Height:      Weight:      SpO2:       Physical Exam General: Elderly frail appearing male NAD Heart: S1, S2, RRR II/VI systolic M. No G/R/ SR with pacs on monitor.  Lungs: Bilateral breath sounds, decreased in bases R>L. On O2. Abdomen: soft nontender, hypoactive BS Extremities: No LE edema. Healing abrasion L knee.  Skin: Warm, very dry, no cyanosis.  Dialysis Access: LUA AVF + thrill/+ bruit  Dialysis Orders: MonWedFri, 4 hrs 0 min, 180NRe Optiflux, BFR 400, DFR Autoflow 1.5, EDW 62 (kg), Dialysate 2.0 K, 2.25 Ca, UFR Profile: Profile 4, Sodium Model: Linear, Access: LUA AV Fistula Heparin: No heparin Mircera: 225 mcg IV q 2 weeks (11/12/15) [name]: 7 mcg IV q MWF  Additional Objective Labs: Basic Metabolic Panel:  Recent Labs Lab 11/19/15 0525 11/19/15 1932 11/22/15 0710  NA 136 138 134*  K 3.7 4.1 3.6  CL 96* 96*  95*  CO2 33* 31 25  GLUCOSE 89 112* 98  BUN 7 <5* 36*  CREATININE 3.41* 2.45* 8.42*  CALCIUM 8.9 9.1 9.2  PHOS  --  1.8*  --    Liver Function Tests:  Recent Labs Lab 11/18/15 0639 11/19/15 1932  AST 19  --   ALT 14*  --   ALKPHOS 53  --   BILITOT 0.6  --   PROT 7.3  --   ALBUMIN 3.0* 3.0*   No results for input(s): LIPASE, AMYLASE in the last 168 hours. CBC:  Recent Labs Lab 11/18/15 1918 11/19/15 0525 11/19/15 1932 11/21/15 0450 11/22/15 0710  WBC 5.6 4.3 5.9 4.7 4.8  HGB 10.3* 10.0* 11.2* 10.5* 9.6*  HCT 31.9* 30.7* 35.2* 32.5* 31.1*  MCV 94.7 93.6 94.4 93.9 93.7  PLT 96* 92* 93* 86* 77*   Blood Culture    Component Value Date/Time   SDES BLOOD RIGHT HAND 09/13/2015 1243   SPECREQUEST BOTTLES DRAWN AEROBIC ONLY 5CC 09/13/2015 1243   CULT NO GROWTH 5 DAYS 09/13/2015 1243   REPTSTATUS 09/18/2015 FINAL 09/13/2015 1243    Cardiac Enzymes: No results for input(s): CKTOTAL, CKMB, CKMBINDEX, TROPONINI in the last 168 hours. CBG:  Recent Labs Lab 11/18/15 0732  GLUCAP  88   Iron Studies: No results for input(s): IRON, TIBC, TRANSFERRIN, FERRITIN in the last 72 hours. @lablastinr3 @ Studies/Results: Dg Chest 2 View  11/21/2015  CLINICAL DATA:  Shortness of breath and cough ; right pleural effusion EXAM: CHEST  2 VIEW COMPARISON:  November 19, 2015 FINDINGS: There is persistent right pleural effusion with right base atelectatic change, stable. Lungs elsewhere clear. Heart is mildly enlarged with pulmonary vascularity within normal limits. Shunt catheter along the right hemithorax is present. A portion of a second catheter on the right is stable as well. No adenopathy. No bone lesions. There is extensive atherosclerotic calcification. IMPRESSION: Stable right effusion with right base atelectasis. Lungs elsewhere clear. Stable cardiac prominence. Electronically Signed   By: Lowella Grip III M.D.   On: 11/21/2015 08:15   Medications:   . sodium chloride    Intravenous Once  . sodium chloride   Intravenous Once  . allopurinol  100 mg Oral Daily  . aspirin  81 mg Oral Daily  . carvedilol  12.5 mg Oral BID WC  . cefTAZidime (FORTAZ)  IV  2 g Intravenous Q M,W,F-HD  . doxercalciferol  1 mcg Intravenous Q M,W,F-HD  . ferric gluconate (FERRLECIT/NULECIT) IV  125 mg Intravenous Q M,W,F-HD  . pantoprazole  40 mg Oral Q0600  . tiotropium  18 mcg Inhalation q morning - 10a  . vitamin B-12  1,000 mcg Oral Daily

## 2015-11-23 NOTE — Discharge Summary (Signed)
Physician Discharge Summary  PECOS MIMNAUGH [id number] DOB: June 25, 1946 DOA: 11/18/2015  PCP: [name], MD  Admit date: 11/18/2015 Discharge date: 11/22/2015  Time spent: 30 minutes  Recommendations for Outpatient Follow-up:  1. Follow up with PCP in 1 to 2 weeks.    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   Filed Weights   11/20/15 2125 11/22/15 0803 11/22/15 1117  Weight: 61.7 kg (136 lb 0.4 oz) 58.2 kg (128 lb 4.9 oz) 57.8 kg (127 lb 6.8 oz)    History of present illness:  Cameron Mendez is a 69 y.o. male with multiple medical problems including end-stage renal disease on hemodialysis Monday, Wednesday, Friday brought in altered mental status and productive cough since 2 weeks. On arrival to ED, he was found to be anemic with hemoglobin of 6.9, heme positive stools, fluid overloaded and encephalopathic. He was admitted to medical service for further evaluation.  His CXR showed chronic right pleural effusion and possible pneumonia, and he is hypoxic requiring 2 lit [place] oxygen. He was started on broad spectrum antibiotics for HCAP. He was also given 2 units of prbc transfusion and gastroenterology consulted for recommendation. Of note he underwent EGD AND COLONOSCOPY in 3/16 showing duodenitis and colon diverticulosis and colon polyps.   Hospital Course:  1. Acute Blood loss anemia with heme positive stools: - also a component of chronic blood loss from diverticulosis.  - resume PPI, and keep hemoglobin greater than 7.  - further  management as per gastroenterology.  - outpatient capsule endoscopy for further evaluation with [place] gastroenterology.   2. ESRD: on HD MWF - further management as per renal.  - iron infusions at the time HD.  3. Acute respiratory failure with chronic right pleural effusion, with hypoxia requiring 2l it of [place] oxygen: -suspect pneumonia , started on broad spectrum antibiotics.  - weaned off the oxygen.  - repeat cxr shows chronic pleural effusion without any evidence of consolidation.  - po levaquin to complete the course on discharge.    4. Acute encephalopathy: probably from the pneumonia.  Initial CT head negative for Acute stroke or bleed.  His confusion has improved, he is oriented to person an dplace.  MRI brain cancelled.    5. Chronic thrombocytopenia: - stable counts.   6. Acute on chronic diastolic heart failure: CXR with pulm edema, and he had HD on 12/8 and will get HD on 12/9 as per RENAL.  Follow up CXR in am shows improvement.  7. Paroxysmal atrial fibrillation: currently in sinus.  Resume home meds.  Procedures:  none  Consultations:  Gastroenterology  Renal.  Discharge Exam: Filed Vitals:   11/22/15 1117 11/22/15 1209  BP: 124/47 120/51  Pulse: 59   Temp: 97.8 F (36.6 C)   Resp: 20     General: alert  Comfortable.  Cardiovascular: s1s2 Respiratory: ctab  Discharge Instructions   Discharge Instructions    Discharge instructions    Complete by:  As directed   Follow up with PCP in one week.  Please obtain a CXR in 4 weeks  to evaluate for resolution of the pneumonia.          Discharge Medication List as of 11/23/2015  6:42 AM    START taking these medications   Details  levofloxacin (LEVAQUIN) 750 MG tablet Take 1 tablet (750 mg total) by mouth every other day., Starting 11/22/2015, Until Discontinued, Print    vitamin B-12 1000 MCG tablet Take 1 tablet (1,000 mcg total) by mouth daily., Starting 11/22/2015, Until  Discontinued, Print      CONTINUE these medications which have NOT CHANGED   Details  allopurinol (ZYLOPRIM) 100 MG tablet Take 100 mg by mouth daily. , Until Discontinued, Historical Med    aspirin 81 MG tablet Take 81 mg by mouth daily., Until Discontinued, Historical Med    carvedilol (COREG) 12.5 MG tablet Take 1 tablet (12.5 mg total) by mouth 2 (two) times daily with a meal., Starting 09/21/2015, Until Discontinued, Print    diltiazem (CARDIZEM CD) 120 MG 24 hr capsule Take 1 capsule (120 mg total) by mouth daily., Starting 10/01/2015, Until Discontinued, Normal    doxercalciferol (HECTOROL) 4 MCG/2ML injection Inject 0.5 mLs (1 mcg total) into the vein every Monday, Wednesday, and Friday with hemodialysis., Starting 08/24/2014, Until Discontinued, No Print    ferric gluconate 125 mg in sodium chloride 0.9 % 100 mL Inject 125 mg into the vein every Monday, Wednesday, and Friday with hemodialysis., Starting 08/24/2014, Until Discontinued, No Print    guaiFENesin (MUCINEX) 600 MG 12 hr tablet Take 1 tablet (600 mg total) by mouth 2 (two) times daily as needed (congestion)., Starting 03/04/2015, Until Discontinued, No Print    Multiple Vitamin (MULTIVITAMIN WITH MINERALS) TABS tablet Take 1 tablet by mouth daily., Until Discontinued, Historical Med    SPIRIVA RESPIMAT 2.5 MCG/ACT AERS INHALE 2 PUFFS INTO THE LUNGS EVERY MORNING, Normal    acetaminophen (TYLENOL) 500 MG tablet Take 1,000 mg by mouth every 6 (six) hours as needed for moderate pain., Until Discontinued, Historical Med    albuterol (PROVENTIL HFA;VENTOLIN HFA) 108 (90 BASE) MCG/ACT inhaler Inhale 1-2 puffs into the lungs every 6 (six) hours as needed for wheezing or shortness of breath., Until Discontinued, Historical Med    diphenhydrAMINE (BENADRYL) 25 MG tablet Take 25 mg by mouth every 6 (six) hours as needed for itching or allergies (congestion). , Until Discontinued, Historical Med       Allergies  Allergen  Reactions  . Ibuprofen Other (See Comments)    Kidney problems-dialysis patient  . Nsaids Other (See Comments)    Kidney problems-dialysis patient      The results of significant diagnostics from this hospitalization (including imaging, microbiology, ancillary and laboratory) are listed below for reference.    Significant Diagnostic Studies: Dg Chest 2 View  11/21/2015  CLINICAL DATA:  Shortness of breath and cough ; right pleural effusion EXAM: CHEST  2 VIEW COMPARISON:  November 19, 2015 FINDINGS: There is persistent right pleural effusion with right base atelectatic change, stable. Lungs elsewhere clear. Heart is mildly enlarged with pulmonary vascularity within normal limits. Shunt catheter along the right hemithorax is present. A portion of a second catheter on the right is stable as well. No adenopathy. No bone lesions. There is extensive atherosclerotic calcification. IMPRESSION: Stable right effusion with right base atelectasis. Lungs elsewhere clear. Stable cardiac prominence. Electronically Signed   By: Lowella Grip III M.D.   On: 11/21/2015 08:15   Dg Chest 2 View  11/18/2015  CLINICAL DATA:  69 year old hypertensive male with cough and  altered mental status. Initial encounter. EXAM: CHEST  2 VIEW COMPARISON:  09/28/2015 chest x-ray and 09/13/2015 chest CT. FINDINGS: Cardiomegaly. Pulmonary vascular congestion Calcified mildly tortuous aorta. Right sided pleural effusion may reflect result of pulmonary edema with adjacent atelectasis. By plain film, cannot exclude right lower lobe infiltrate or mass. Follow-up until clearance recommended. Ventriculoperitoneal shunt courses over the right thorax. IMPRESSION: Cardiomegaly. Pulmonary vascular congestion Right sided pleural effusion may reflect result of pulmonary edema with adjacent atelectasis. By plain film, cannot exclude right lower lobe infiltrate or mass. Follow-up until clearance recommended. Electronically Signed   By: [name]   On: 11/18/2015 07:28   Ct Head Wo Contrast  11/18/2015  CLINICAL DATA:  69 year old male with altered mental status. Hemodialysis. Legally blind. Initial encounter. EXAM: CT HEAD WITHOUT CONTRAST TECHNIQUE: Contiguous axial images were obtained from the base of the skull through the vertex without intravenous contrast. COMPARISON:  09/13/2015 head CT. FINDINGS: Shunt within right lateral ventricle unchanged in position without development of hydrocephalus or slit like appearance of the ventricles. Global atrophy. No acute intracranial hemorrhage. Remote infarcts posterior left frontal lobe, corona radiata bilaterally, left caudate head and right lenticular nucleus. Prominent white matter type changes consistent with result of small vessel disease. No CT evidence of large acute infarct although with the degree of white matter disease excluding small or medium size infarct difficult by CT. 6 mm right frontal calcified meningioma without associated mass effect stable in appearance. Vascular calcifications. Post lens replacement calcified on the left. Prominent mucosal thickening maxillary sinuses bilaterally and left sphenoid sinus. Opacification ethmoid sinus air cells bilaterally. IMPRESSION: Shunt within right lateral ventricle without hydrocephalus. Global atrophy. No acute intracranial hemorrhage. Remote infarcts posterior left frontal lobe, corona radiata bilaterally, left caudate head and right lenticular nucleus. Prominent small vessel disease. No CT evidence of large acute infarct. 6 mm right frontal calcified meningioma without associated mass effect stable in appearance. Vascular calcifications. Prominent mucosal thickening maxillary sinuses bilaterally and left sphenoid sinus. Opacification ethmoid sinus air cells bilaterally. Electronically Signed   By: [name]   On: 11/18/2015 08:30   Portable Chest 1 View  11/19/2015  CLINICAL DATA:  Several day history of cough, onset of  shortness of breath today. EXAM: PORTABLE CHEST 1 VIEW COMPARISON:  PA and lateral chest x-ray of November 18, 2015 FINDINGS: The lungs are adequately inflated. There is a smaller moderate-sized right pleural effusion layering posteriorly. Right basilar atelectasis or pneumonia is suspected. The left lung is clear though the interstitial markings are mildly increased. The cardiac silhouette remains enlarged. The central pulmonary vascularity is engorged. The ventriculoperitoneal shunt tube is unchanged in appearance. The bony thorax exhibits no acute abnormality. IMPRESSION: Stable moderate size right pleural effusion layering posteriorly. Right lower lobe atelectasis or pneumonia. Stable cardiomegaly with mild pulmonary vascular congestion. Ventriculoperitoneal shunt tube visible as well as a tube fragment project over the right hemithorax which are chronic findings. Electronically Signed   By: David  Martinique M.D.   On: 11/19/2015 07:27    Microbiology: No results found for this or any previous visit (from the past 240 hour(s)).   Labs: Basic Metabolic Panel:  Recent Labs Lab 11/18/15 0639 11/19/15 0525 11/19/15 1932 11/22/15 0710  NA 141 136 138 134*  K 3.4* 3.7 4.1 3.6  CL 98* 96* 96* 95*  CO2 34* 33* 31 25  GLUCOSE 100* 89 112* 98  BUN 11 7 <5* 36*  CREATININE 4.38* 3.41* 2.45* 8.42*  CALCIUM  9.2 8.9 9.1 9.2  PHOS  --   --  1.8*  --    Liver Function Tests:  Recent Labs Lab 11/18/15 0639 11/19/15 1932  AST 19  --   ALT 14*  --   ALKPHOS 53  --   BILITOT 0.6  --   PROT 7.3  --   ALBUMIN 3.0* 3.0*   No results for input(s): LIPASE, AMYLASE in the last 168 hours. No results for input(s): AMMONIA in the last 168 hours. CBC:  Recent Labs Lab 11/18/15 1918 11/19/15 0525 11/19/15 1932 11/21/15 0450 11/22/15 0710  WBC 5.6 4.3 5.9 4.7 4.8  HGB 10.3* 10.0* 11.2* 10.5* 9.6*  HCT 31.9* 30.7* 35.2* 32.5* 31.1*  MCV 94.7 93.6 94.4 93.9 93.7  PLT 96* 92* 93* 86* 77*    Cardiac Enzymes: No results for input(s): CKTOTAL, CKMB, CKMBINDEX, TROPONINI in the last 168 hours. BNP: BNP (last 3 results)  Recent Labs  09/13/15 0657 09/28/15 1842 11/18/15 0639  BNP 1766.2* 764.8* 2264.3*    ProBNP (last 3 results) No results for input(s): PROBNP in the last 8760 hours.  CBG:  Recent Labs Lab 11/18/15 0732  GLUCAP 13       Signed:  Joyleen Haselton  [facility] 11/23/2015, 9:59 AM

## 2016-05-22 ENCOUNTER — Encounter: Payer: Self-pay | Admitting: Vascular Surgery

## 2016-05-30 ENCOUNTER — Encounter: Payer: Self-pay | Admitting: Vascular Surgery

## 2016-05-30 ENCOUNTER — Ambulatory Visit (INDEPENDENT_AMBULATORY_CARE_PROVIDER_SITE_OTHER): Payer: Medicare Other | Admitting: Vascular Surgery

## 2016-05-30 VITALS — BP 113/54 | HR 75 | Temp 98.8°F | Resp 18 | Ht 65.0 in | Wt 140.0 lb

## 2016-05-30 DIAGNOSIS — N186 End stage renal disease: Secondary | ICD-10-CM

## 2016-05-30 DIAGNOSIS — Z992 Dependence on renal dialysis: Secondary | ICD-10-CM | POA: Diagnosis not present

## 2016-05-30 NOTE — Progress Notes (Signed)
[facility] and [facility]in Specialist of [facility]  Patient name: Cameron Mendez MRN: [id number] DOB: 1946-01-12 Sex: male  REASON FOR VISIT: Evaluate possible steal left hand with AV fistula present  HPI: Cameron Mendez is a 70 y.o. male endometrium a prior left upper arm AV fistula creation in August 2013. He is here today with his wife. He has had very good use of his fistula over this period of time. Does report one episode of intervention [facility] vascular. Apparently there was some question regarding discomfort and weakness in his arm. He reports that this was temporary is completely resolved. He denies any pain or achy sensation even when on dialysis. Is blind but otherwise is quite functional.  Past Medical History  Diagnosis Date  . Anemia   . Diastolic dysfunction   . Retinitis pigmentosa     Blindness--has shunt --placed yrs ago in [place]..  . Arthritis     Gout  . Hypertension     dx--"long time"  . CKD (chronic kidney disease), stage IV ([place])     a. L upper extremity AV fistula created 07/2012.  [name] BPH (benign prostatic hyperplasia)   . Blind   . Gangrene of finger ([place])     a. 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  . Colon polyps   . Pericardial effusion     a. Noted on echo 10/2009. b. Again seen on echo 09/2013.  [name] CHF (congestive heart failure) ([place])   . COPD (chronic obstructive pulmonary disease) ([place])   . GI (gastrointestinal bleed) feb 2016  . Irregular heart rate 03/03/2015  . Cardiac arrest [facility])     Family History  Problem Relation Age of Onset  . Ovarian cancer Mother   . Kidney disease Neg Hx   . Gallbladder disease Neg Hx   . Esophageal cancer Neg Hx   . Heart disease Neg Hx   . Stomach cancer Neg Hx   . Colon polyps Brother   . Colon cancer Brother     SOCIAL HISTORY: Social History  Substance Use Topics  . Smoking status: Former Smoker -- 0.50 packs/day for 50 years    Types: Cigarettes    Quit date:  08/31/2015  . Smokeless tobacco: Never Used     Comment: Pt given handout on how to quit smoking 12/31/14  . Alcohol Use: No     Comment: "quit driinking in ~ 2000"    Allergies  Allergen Reactions  . Ibuprofen Other (See Comments)    Kidney problems-dialysis patient  . Nsaids Other (See Comments)    Kidney problems-dialysis patient    Current Outpatient Prescriptions  Medication Sig Dispense Refill  . acetaminophen (TYLENOL) 500 MG tablet Take 1,000 mg by mouth every 6 (six) hours as needed for moderate pain.    [name] albuterol (PROVENTIL HFA;VENTOLIN HFA) 108 (90 BASE) MCG/ACT inhaler Inhale 1-2 puffs into the lungs every 6 (six) hours as needed for wheezing or shortness of breath.    . allopurinol (ZYLOPRIM) 100 MG tablet Take 100 mg by mouth daily.     [name] aspirin 81 MG tablet Take 81 mg by mouth daily.    . carvedilol (COREG) 12.5 MG tablet Take 1 tablet (12.5 mg total) by mouth 2 (two) times daily with a meal. 60 tablet 0  . diltiazem (CARDIZEM CD) 120 MG 24 hr capsule Take 1 capsule (120 mg total) by mouth daily. 30 capsule 1  . diphenhydrAMINE (BENADRYL) 25 MG tablet Take 25 mg by mouth  every 6 (six) hours as needed for itching or allergies (congestion).     [name] doxercalciferol (HECTOROL) 4 MCG/2ML injection Inject 0.5 mLs (1 mcg total) into the vein every Monday, Wednesday, and Friday with hemodialysis. 2 mL   . ferric gluconate 125 mg in sodium chloride 0.9 % 100 mL Inject 125 mg into the vein every Monday, Wednesday, and Friday with hemodialysis.    [name] guaiFENesin (MUCINEX) 600 MG 12 hr tablet Take 1 tablet (600 mg total) by mouth 2 (two) times daily as needed (congestion).    [name] levofloxacin (LEVAQUIN) 750 MG tablet Take 1 tablet (750 mg total) by mouth every other day. 2 tablet 0  . Multiple Vitamin (MULTIVITAMIN WITH MINERALS) TABS tablet Take 1 tablet by mouth daily.    [name] SPIRIVA RESPIMAT 2.5 MCG/ACT AERS INHALE 2 PUFFS INTO THE LUNGS EVERY MORNING 4 Inhaler 0  . vitamin B-12 1000  MCG tablet Take 1 tablet (1,000 mcg total) by mouth daily. 30 tablet 0   No current facility-administered medications for this visit.    REVIEW OF SYSTEMS:  [X]  denotes positive finding, [ ]  denotes negative finding Cardiac  Comments:  Chest pain or chest pressure:    Shortness of breath upon exertion:    Short of breath when lying flat:    Irregular heart rhythm:        Vascular    Pain in calf, thigh, or hip brought on by ambulation:    Pain in feet at night that wakes you up from your sleep:     Blood clot in your veins:    Leg swelling:         Pulmonary    Oxygen at home:    Productive cough:     Wheezing:         Neurologic    Sudden weakness in arms or legs:     Sudden numbness in arms or legs:     Sudden onset of difficulty speaking or slurred speech:    Temporary loss of vision in one eye:     Problems with dizziness:         Gastrointestinal    Blood in stool:     Vomited blood:         Genitourinary    Burning when urinating:     Blood in urine:        Psychiatric    Major depression:         Hematologic    Bleeding problems:    Problems with blood clotting too easily:        Skin    Rashes or ulcers:        Constitutional    Fever or chills:      PHYSICAL EXAM: Filed Vitals:   05/30/16 1403  BP: 113/54  Pulse: 75  Temp: 98.8 F (37.1 C)  Resp: 18  Height: 5\' 5"  (1.651 m)  Weight: 140 lb (63.504 kg)  SpO2: 100%    GENERAL: The patient is a well-nourished male, in no acute distress. The vital signs are documented above.He is blind VASCULAR: Left upper arm AV fistula with excellent thrill and no evidence of skin breakdown. 2+ left radial pulse PULMONARY: There is good air exchange.  MUSCULOSKELETAL: There are no major deformities or cyanosis. Does have amputation the tip of his left fifth finger NEUROLOGIC: No focal weakness or paresthesias are detected. No weakness or neurologic deficits in his left hand SKIN: There are no ulcers or  rashes  noted. PSYCHIATRIC: The patient has a normal affect.   MEDICAL ISSUES: Discussed with the patient and his wife present. No evidence of steal symptoms. Completely asymptomatic when on dialysis and when not on dialysis.    Rosetta Posner, MD FACS [facility] Tel [phone] Pager [phone]

## 2016-11-23 ENCOUNTER — Inpatient Hospital Stay (HOSPITAL_COMMUNITY): Payer: Medicare Other

## 2016-11-23 ENCOUNTER — Encounter (HOSPITAL_COMMUNITY): Payer: Self-pay | Admitting: *Deleted

## 2016-11-23 ENCOUNTER — Emergency Department (HOSPITAL_COMMUNITY): Payer: Medicare Other

## 2016-11-23 ENCOUNTER — Inpatient Hospital Stay (HOSPITAL_COMMUNITY)
Admission: EM | Admit: 2016-11-23 | Discharge: 2016-11-28 | DRG: 640 | Disposition: A | Discharge status: Skilled Nursing Facility | Payer: Medicare Other | Attending: Internal Medicine | Admitting: Internal Medicine

## 2016-11-23 DIAGNOSIS — Z800 Family history of malignant neoplasm of digestive organs: Secondary | ICD-10-CM

## 2016-11-23 DIAGNOSIS — E8770 Fluid overload, unspecified: Secondary | ICD-10-CM | POA: Diagnosis present

## 2016-11-23 DIAGNOSIS — Z9889 Other specified postprocedural states: Secondary | ICD-10-CM

## 2016-11-23 DIAGNOSIS — Z992 Dependence on renal dialysis: Secondary | ICD-10-CM

## 2016-11-23 DIAGNOSIS — E43 Unspecified severe protein-calorie malnutrition: Secondary | ICD-10-CM | POA: Diagnosis present

## 2016-11-23 DIAGNOSIS — H548 Legal blindness, as defined in USA: Secondary | ICD-10-CM | POA: Diagnosis present

## 2016-11-23 DIAGNOSIS — I132 Hypertensive heart and chronic kidney disease with heart failure and with stage 5 chronic kidney disease, or end stage renal disease: Secondary | ICD-10-CM | POA: Diagnosis present

## 2016-11-23 DIAGNOSIS — M1990 Unspecified osteoarthritis, unspecified site: Secondary | ICD-10-CM | POA: Diagnosis present

## 2016-11-23 DIAGNOSIS — D638 Anemia in other chronic diseases classified elsewhere: Secondary | ICD-10-CM | POA: Diagnosis present

## 2016-11-23 DIAGNOSIS — Z888 Allergy status to other drugs, medicaments and biological substances status: Secondary | ICD-10-CM | POA: Diagnosis not present

## 2016-11-23 DIAGNOSIS — R6889 Other general symptoms and signs: Secondary | ICD-10-CM

## 2016-11-23 DIAGNOSIS — Z79899 Other long term (current) drug therapy: Secondary | ICD-10-CM

## 2016-11-23 DIAGNOSIS — Z8674 Personal history of sudden cardiac arrest: Secondary | ICD-10-CM

## 2016-11-23 DIAGNOSIS — Z9841 Cataract extraction status, right eye: Secondary | ICD-10-CM | POA: Diagnosis not present

## 2016-11-23 DIAGNOSIS — Z86010 Personal history of colonic polyps: Secondary | ICD-10-CM | POA: Diagnosis not present

## 2016-11-23 DIAGNOSIS — D696 Thrombocytopenia, unspecified: Secondary | ICD-10-CM | POA: Diagnosis present

## 2016-11-23 DIAGNOSIS — R0600 Dyspnea, unspecified: Secondary | ICD-10-CM | POA: Diagnosis not present

## 2016-11-23 DIAGNOSIS — I480 Paroxysmal atrial fibrillation: Secondary | ICD-10-CM

## 2016-11-23 DIAGNOSIS — Z87891 Personal history of nicotine dependence: Secondary | ICD-10-CM | POA: Diagnosis not present

## 2016-11-23 DIAGNOSIS — Z8371 Family history of colonic polyps: Secondary | ICD-10-CM

## 2016-11-23 DIAGNOSIS — J9601 Acute respiratory failure with hypoxia: Secondary | ICD-10-CM | POA: Diagnosis present

## 2016-11-23 DIAGNOSIS — R0602 Shortness of breath: Secondary | ICD-10-CM | POA: Diagnosis present

## 2016-11-23 DIAGNOSIS — Z9842 Cataract extraction status, left eye: Secondary | ICD-10-CM

## 2016-11-23 DIAGNOSIS — F05 Delirium due to known physiological condition: Secondary | ICD-10-CM | POA: Diagnosis present

## 2016-11-23 DIAGNOSIS — Z961 Presence of intraocular lens: Secondary | ICD-10-CM | POA: Diagnosis present

## 2016-11-23 DIAGNOSIS — Z7901 Long term (current) use of anticoagulants: Secondary | ICD-10-CM

## 2016-11-23 DIAGNOSIS — M109 Gout, unspecified: Secondary | ICD-10-CM | POA: Diagnosis present

## 2016-11-23 DIAGNOSIS — J440 Chronic obstructive pulmonary disease with acute lower respiratory infection: Secondary | ICD-10-CM | POA: Diagnosis present

## 2016-11-23 DIAGNOSIS — I4891 Unspecified atrial fibrillation: Secondary | ICD-10-CM | POA: Diagnosis present

## 2016-11-23 DIAGNOSIS — N186 End stage renal disease: Secondary | ICD-10-CM | POA: Diagnosis present

## 2016-11-23 DIAGNOSIS — R451 Restlessness and agitation: Secondary | ICD-10-CM | POA: Diagnosis present

## 2016-11-23 DIAGNOSIS — J449 Chronic obstructive pulmonary disease, unspecified: Secondary | ICD-10-CM | POA: Diagnosis present

## 2016-11-23 DIAGNOSIS — I509 Heart failure, unspecified: Secondary | ICD-10-CM | POA: Diagnosis present

## 2016-11-23 DIAGNOSIS — Z7982 Long term (current) use of aspirin: Secondary | ICD-10-CM

## 2016-11-23 DIAGNOSIS — R05 Cough: Secondary | ICD-10-CM | POA: Diagnosis present

## 2016-11-23 DIAGNOSIS — I10 Essential (primary) hypertension: Secondary | ICD-10-CM | POA: Diagnosis present

## 2016-11-23 DIAGNOSIS — J441 Chronic obstructive pulmonary disease with (acute) exacerbation: Secondary | ICD-10-CM

## 2016-11-23 DIAGNOSIS — R0603 Acute respiratory distress: Secondary | ICD-10-CM | POA: Diagnosis present

## 2016-11-23 DIAGNOSIS — R059 Cough, unspecified: Secondary | ICD-10-CM | POA: Diagnosis present

## 2016-11-23 DIAGNOSIS — Z8041 Family history of malignant neoplasm of ovary: Secondary | ICD-10-CM

## 2016-11-23 DIAGNOSIS — F0390 Unspecified dementia without behavioral disturbance: Secondary | ICD-10-CM | POA: Diagnosis present

## 2016-11-23 DIAGNOSIS — J189 Pneumonia, unspecified organism: Secondary | ICD-10-CM | POA: Diagnosis present

## 2016-11-23 DIAGNOSIS — R4182 Altered mental status, unspecified: Secondary | ICD-10-CM

## 2016-11-23 DIAGNOSIS — D649 Anemia, unspecified: Secondary | ICD-10-CM | POA: Diagnosis present

## 2016-11-23 LAB — COMPREHENSIVE METABOLIC PANEL
ALT: 18 U/L (ref 17–63)
ANION GAP: 15 (ref 5–15)
AST: 24 U/L (ref 15–41)
Albumin: 3.3 g/dL — ABNORMAL LOW (ref 3.5–5.0)
Alkaline Phosphatase: 51 U/L (ref 38–126)
BILIRUBIN TOTAL: 0.7 mg/dL (ref 0.3–1.2)
BUN: 61 mg/dL — ABNORMAL HIGH (ref 6–20)
CHLORIDE: 107 mmol/L (ref 101–111)
CO2: 17 mmol/L — ABNORMAL LOW (ref 22–32)
Calcium: 8.9 mg/dL (ref 8.9–10.3)
Creatinine, Ser: 9.66 mg/dL — ABNORMAL HIGH (ref 0.61–1.24)
GFR calc non Af Amer: 5 mL/min — ABNORMAL LOW (ref >60)
GFR, EST AFRICAN AMERICAN: 6 mL/min — AB (ref >60)
Glucose, Bld: 88 mg/dL (ref 65–99)
POTASSIUM: 5.5 mmol/L — AB (ref 3.5–5.1)
Sodium: 139 mmol/L (ref 135–145)
TOTAL PROTEIN: 7.3 g/dL (ref 6.5–8.1)

## 2016-11-23 LAB — BLOOD GAS, ARTERIAL
Acid-base deficit: 10.4 mmol/L — ABNORMAL HIGH (ref 0.0–2.0)
Bicarbonate: 13.9 mmol/L — ABNORMAL LOW (ref 20.0–28.0)
DRAWN BY: 312971
FIO2: 21
O2 SAT: 97.2 %
PATIENT TEMPERATURE: 98.6
PO2 ART: 102 mmHg (ref 83.0–108.0)
pCO2 arterial: 25.3 mmHg — ABNORMAL LOW (ref 32.0–48.0)
pH, Arterial: 7.36 (ref 7.350–7.450)

## 2016-11-23 LAB — I-STAT TROPONIN, ED: TROPONIN I, POC: 0.04 ng/mL (ref 0.00–0.08)

## 2016-11-23 LAB — I-STAT CHEM 8, ED
BUN: 63 mg/dL — AB (ref 6–20)
CREATININE: 10.2 mg/dL — AB (ref 0.61–1.24)
Calcium, Ion: 1.16 mmol/L (ref 1.15–1.40)
Chloride: 110 mmol/L (ref 101–111)
GLUCOSE: 85 mg/dL (ref 65–99)
HCT: 25 % — ABNORMAL LOW (ref 39.0–52.0)
HEMOGLOBIN: 8.5 g/dL — AB (ref 13.0–17.0)
POTASSIUM: 5.5 mmol/L — AB (ref 3.5–5.1)
Sodium: 139 mmol/L (ref 135–145)
TCO2: 20 mmol/L (ref 0–100)

## 2016-11-23 LAB — RESPIRATORY PANEL BY PCR
Adenovirus: NOT DETECTED
Bordetella pertussis: NOT DETECTED
CHLAMYDOPHILA PNEUMONIAE-RVPPCR: NOT DETECTED
CORONAVIRUS 229E-RVPPCR: NOT DETECTED
Coronavirus HKU1: NOT DETECTED
Coronavirus NL63: NOT DETECTED
Coronavirus OC43: NOT DETECTED
INFLUENZA A-RVPPCR: NOT DETECTED
INFLUENZA B-RVPPCR: NOT DETECTED
MYCOPLASMA PNEUMONIAE-RVPPCR: NOT DETECTED
Metapneumovirus: NOT DETECTED
PARAINFLUENZA VIRUS 4-RVPPCR: NOT DETECTED
Parainfluenza Virus 1: NOT DETECTED
Parainfluenza Virus 2: NOT DETECTED
Parainfluenza Virus 3: NOT DETECTED
RESPIRATORY SYNCYTIAL VIRUS-RVPPCR: NOT DETECTED
Rhinovirus / Enterovirus: NOT DETECTED

## 2016-11-23 LAB — CG4 I-STAT (LACTIC ACID): Lactic Acid, Venous: 1.57 mmol/L (ref 0.5–1.9)

## 2016-11-23 LAB — CBC WITH DIFFERENTIAL/PLATELET
BASOS ABS: 0 10*3/uL (ref 0.0–0.1)
Basophils Relative: 0 %
EOS PCT: 0 %
Eosinophils Absolute: 0 10*3/uL (ref 0.0–0.7)
HCT: 25.5 % — ABNORMAL LOW (ref 39.0–52.0)
Hemoglobin: 7.9 g/dL — ABNORMAL LOW (ref 13.0–17.0)
LYMPHS ABS: 0.9 10*3/uL (ref 0.7–4.0)
Lymphocytes Relative: 12 %
MCH: 29.5 pg (ref 26.0–34.0)
MCHC: 31 g/dL (ref 30.0–36.0)
MCV: 95.1 fL (ref 78.0–100.0)
MONO ABS: 0.4 10*3/uL (ref 0.1–1.0)
MONOS PCT: 6 %
Neutro Abs: 6.4 10*3/uL (ref 1.7–7.7)
Neutrophils Relative %: 82 %
PLATELETS: 113 10*3/uL — AB (ref 150–400)
RBC: 2.68 MIL/uL — ABNORMAL LOW (ref 4.22–5.81)
RDW: 18.5 % — AB (ref 11.5–15.5)
WBC: 7.7 10*3/uL (ref 4.0–10.5)

## 2016-11-23 LAB — PROCALCITONIN: Procalcitonin: 2.42 ng/mL

## 2016-11-23 LAB — INFLUENZA PANEL BY PCR (TYPE A & B)
INFLAPCR: NEGATIVE
INFLBPCR: NEGATIVE

## 2016-11-23 LAB — MRSA PCR SCREENING: MRSA by PCR: NEGATIVE

## 2016-11-23 LAB — STREP PNEUMONIAE URINARY ANTIGEN: Strep Pneumo Urinary Antigen: NEGATIVE

## 2016-11-23 LAB — POC OCCULT BLOOD, ED: Fecal Occult Bld: NEGATIVE

## 2016-11-23 LAB — I-STAT CG4 LACTIC ACID, ED: LACTIC ACID, VENOUS: 1.26 mmol/L (ref 0.5–1.9)

## 2016-11-23 MED ORDER — DEXTROSE 5 % IV SOLN
1.0000 g | Freq: Three times a day (TID) | INTRAVENOUS | Status: DC
  Filled 2016-11-23 (×3): qty 1

## 2016-11-23 MED ORDER — DEXTROSE 5 % IV SOLN
1.0000 g | INTRAVENOUS | Status: DC
  Administered 2016-11-23: 1 g via INTRAVENOUS
  Filled 2016-11-23: qty 1

## 2016-11-23 MED ORDER — DILTIAZEM HCL ER COATED BEADS 120 MG PO CP24
120.0000 mg | ORAL_CAPSULE | Freq: Every day | ORAL | Status: DC
  Administered 2016-11-23 – 2016-11-28 (×6): 120 mg via ORAL
  Filled 2016-11-23 (×6): qty 1

## 2016-11-23 MED ORDER — LEVOFLOXACIN IN D5W 750 MG/150ML IV SOLN
750.0000 mg | Freq: Once | INTRAVENOUS | Status: AC
  Administered 2016-11-23: 750 mg via INTRAVENOUS
  Filled 2016-11-23: qty 150

## 2016-11-23 MED ORDER — SODIUM CHLORIDE 0.9 % IV BOLUS (SEPSIS)
1000.0000 mL | Freq: Once | INTRAVENOUS | Status: AC
  Administered 2016-11-23: 1000 mL via INTRAVENOUS

## 2016-11-23 MED ORDER — IPRATROPIUM-ALBUTEROL 0.5-2.5 (3) MG/3ML IN SOLN
3.0000 mL | Freq: Three times a day (TID) | RESPIRATORY_TRACT | Status: DC
  Administered 2016-11-24 – 2016-11-26 (×9): 3 mL via RESPIRATORY_TRACT
  Filled 2016-11-23 (×9): qty 3

## 2016-11-23 MED ORDER — HEPARIN SODIUM (PORCINE) 5000 UNIT/ML IJ SOLN
5000.0000 [IU] | Freq: Three times a day (TID) | INTRAMUSCULAR | Status: DC
  Administered 2016-11-23 – 2016-11-24 (×2): 5000 [IU] via SUBCUTANEOUS
  Filled 2016-11-23 (×2): qty 1

## 2016-11-23 MED ORDER — IPRATROPIUM-ALBUTEROL 0.5-2.5 (3) MG/3ML IN SOLN
3.0000 mL | Freq: Four times a day (QID) | RESPIRATORY_TRACT | Status: DC
  Administered 2016-11-23 (×2): 3 mL via RESPIRATORY_TRACT
  Filled 2016-11-23 (×2): qty 3

## 2016-11-23 MED ORDER — ONDANSETRON HCL 4 MG/2ML IJ SOLN: 4.0000 mg | Freq: Four times a day (QID) | INTRAMUSCULAR | Status: DC | PRN

## 2016-11-23 MED ORDER — ACETAMINOPHEN 325 MG PO TABS
650.0000 mg | ORAL_TABLET | Freq: Four times a day (QID) | ORAL | Status: DC | PRN
  Administered 2016-11-23 – 2016-11-26 (×6): 650 mg via ORAL
  Filled 2016-11-23 (×6): qty 2

## 2016-11-23 MED ORDER — ALBUTEROL SULFATE (2.5 MG/3ML) 0.083% IN NEBU
5.0000 mg | INHALATION_SOLUTION | Freq: Once | RESPIRATORY_TRACT | Status: AC
  Administered 2016-11-23: 5 mg via RESPIRATORY_TRACT
  Filled 2016-11-23: qty 6

## 2016-11-23 MED ORDER — ACETAMINOPHEN 650 MG RE SUPP: 650.0000 mg | Freq: Four times a day (QID) | RECTAL | Status: DC | PRN

## 2016-11-23 MED ORDER — METHYLPREDNISOLONE SODIUM SUCC 125 MG IJ SOLR
125.0000 mg | Freq: Once | INTRAMUSCULAR | Status: AC
  Administered 2016-11-23: 125 mg via INTRAVENOUS
  Filled 2016-11-23: qty 2

## 2016-11-23 MED ORDER — ONDANSETRON HCL 4 MG PO TABS: 4.0000 mg | ORAL_TABLET | Freq: Four times a day (QID) | ORAL | Status: DC | PRN

## 2016-11-23 MED ORDER — HYDROCOD POLST-CPM POLST ER 10-8 MG/5ML PO SUER
5.0000 mL | Freq: Two times a day (BID) | ORAL | Status: DC | PRN
  Administered 2016-11-23: 5 mL via ORAL
  Filled 2016-11-23: qty 5

## 2016-11-23 MED ORDER — LORAZEPAM 2 MG/ML IJ SOLN
INTRAMUSCULAR | Status: AC
  Administered 2016-11-23: 0.5 mg via INTRAVENOUS
  Filled 2016-11-23: qty 1

## 2016-11-23 MED ORDER — METHYLPREDNISOLONE SODIUM SUCC 125 MG IJ SOLR
60.0000 mg | Freq: Four times a day (QID) | INTRAMUSCULAR | Status: DC
  Administered 2016-11-23 – 2016-11-24 (×4): 60 mg via INTRAVENOUS
  Filled 2016-11-23 (×4): qty 2

## 2016-11-23 MED ORDER — CARVEDILOL 12.5 MG PO TABS
12.5000 mg | ORAL_TABLET | Freq: Two times a day (BID) | ORAL | Status: DC
  Administered 2016-11-23 – 2016-11-28 (×10): 12.5 mg via ORAL
  Filled 2016-11-23 (×11): qty 1

## 2016-11-23 MED ORDER — ALLOPURINOL 100 MG PO TABS
100.0000 mg | ORAL_TABLET | Freq: Every day | ORAL | Status: DC
  Administered 2016-11-24 – 2016-11-28 (×5): 100 mg via ORAL
  Filled 2016-11-23 (×5): qty 1

## 2016-11-23 MED ORDER — VANCOMYCIN HCL IN DEXTROSE 1-5 GM/200ML-% IV SOLN: 1000.0000 mg | Freq: Once | INTRAVENOUS | Status: DC

## 2016-11-23 MED ORDER — DEXTROSE 5 % IV SOLN
500.0000 mg | INTRAVENOUS | Status: DC
  Filled 2016-11-23: qty 0.5

## 2016-11-23 MED ORDER — LORAZEPAM 2 MG/ML IJ SOLN
0.5000 mg | Freq: Once | INTRAMUSCULAR | Status: AC
  Administered 2016-11-23: 0.5 mg via INTRAVENOUS

## 2016-11-23 MED ORDER — VANCOMYCIN HCL 1000 MG IV SOLR
1500.0000 mg | INTRAVENOUS | Status: AC
  Administered 2016-11-23: 1500 mg via INTRAVENOUS
  Filled 2016-11-23: qty 1500

## 2016-11-23 MED ORDER — ACETAMINOPHEN 325 MG PO TABS
650.0000 mg | ORAL_TABLET | Freq: Once | ORAL | Status: AC
  Administered 2016-11-23: 650 mg via ORAL
  Filled 2016-11-23: qty 2

## 2016-11-23 MED ORDER — DEXTROSE 5 % IV SOLN
1.0000 g | Freq: Once | INTRAVENOUS | Status: AC
  Administered 2016-11-23: 1 g via INTRAVENOUS
  Filled 2016-11-23: qty 1

## 2016-11-23 MED ORDER — DEXTROSE 5 % IV SOLN
2.0000 g | INTRAVENOUS | Status: DC
  Filled 2016-11-23: qty 2

## 2016-11-23 MED ORDER — ASPIRIN EC 81 MG PO TBEC
81.0000 mg | DELAYED_RELEASE_TABLET | Freq: Every day | ORAL | Status: DC
  Administered 2016-11-24 – 2016-11-28 (×5): 81 mg via ORAL
  Filled 2016-11-23 (×5): qty 1

## 2016-11-23 MED ORDER — CYCLOBENZAPRINE HCL 5 MG PO TABS
5.0000 mg | ORAL_TABLET | Freq: Once | ORAL | Status: AC
  Administered 2016-11-23: 5 mg via ORAL
  Filled 2016-11-23: qty 1

## 2016-11-23 MED ORDER — VANCOMYCIN HCL IN DEXTROSE 750-5 MG/150ML-% IV SOLN
750.0000 mg | INTRAVENOUS | Status: DC
  Administered 2016-11-24: 750 mg via INTRAVENOUS
  Filled 2016-11-23: qty 150

## 2016-11-23 NOTE — Procedures (Signed)
  I was present at this dialysis session, have reviewed the session itself and made  appropriate changes Kelly Splinter MD [place] pager [phone]   11/23/2016, 12:57 PM

## 2016-11-23 NOTE — Progress Notes (Signed)
Pharmacy Antibiotic Note  Cameron Mendez is a 70 y.o. male admitted on 11/23/2016 with pneumonia.  Pharmacy has been consulted for vancomycin dosing.  Plan: -Vanc 1g x1 now -F/u HD schedule for subsequent doses (currently not ordered) -Cefepime 1g Q24H HD dosing per pharmacy renal adjustment protocol    Temp (24hrs), Avg:97.8 F (36.6 C), Min:97.7 F (36.5 C), Max:97.8 F (36.6 C)   Recent Labs Lab 11/23/16 0610 11/23/16 0618  WBC 7.7  --   CREATININE 9.66* 10.20*  LATICACIDVEN  --  1.26    CrCl cannot be calculated (Unknown ideal weight.).    Allergies  Allergen Reactions  . Ibuprofen Other (See Comments)    Kidney problems-dialysis patient  . Nsaids Other (See Comments)    Kidney problems-dialysis patient    Antimicrobials this admission: Cefepime 12/14 >> Levofloxacin 12/14 >>  Vanc 12/14 >>  Dose adjustments this admission: n/a  Microbiology results: 12/14 BCx: sent 12/14 Sputum: sent 12/14 Respiratory panel: sent  Thank you for allowing pharmacy to be a part of this patient's care.  Myer Peer Grayland Ormond), PharmD  [id number] Pharmacy Resident Pager: [phone] 11/23/2016 8:55 AM

## 2016-11-23 NOTE — Progress Notes (Signed)
Dialysis treatment completed.  2700 mL ultrafiltrated and net fluid removal 2000 mL.    Patient Sleepy, arouses to voice. Lung sounds diminished to ausculation in all fields. Generalized edema. Cardiac: NSR.  Disconnected lines and removed needles.  Pressure held for 10 minutes and band aid/gauze dressing applied.  Report given to bedside RN, Wells Guiles.    Prescribed UF goal unattainable d/t severe cramping per patient.  Ativan given X 2 for total of 1mg  during HD treatment.

## 2016-11-23 NOTE — Progress Notes (Signed)
Pharmacy Antibiotic Note  Cameron Mendez is a 70 y.o. male admitted on 11/23/2016 with pneumonia.  Pharmacy has been consulted for vancomycin and cefepime dosing.  Patient is ESRD on HD MWF- missed 2 sessions PTA, going off schedule on 12/14.  Plan: Vanc 1500mg  IV x1 to be given after HD 12/14 -Vanc 750mg  IV qHD-MWF starting 12/15 -Cefepime 1g given in ED- give another 1g after HD, then start Cefepime 2g IV qMWF @ 2000 on 12/15 -f/u HD schedule/tolerance, clinical progression, level PRN   Height: 5\' 3"  (160 cm) Weight: 152 lb 5.4 oz (69.1 kg) IBW/kg (Calculated) : 56.9  Temp (24hrs), Avg:97.6 F (36.4 C), Min:97.5 F (36.4 C), Max:97.8 F (36.6 C)   Recent Labs Lab 11/23/16 0610 11/23/16 0618 11/23/16 0915  WBC 7.7  --   --   CREATININE 9.66* 10.20*  --   LATICACIDVEN  --  1.26 1.57    Estimated Creatinine Clearance: 5.9 mL/min (by C-G formula based on SCr of 10.2 mg/dL (H)).    Allergies  Allergen Reactions  . Ibuprofen Other (See Comments)    Kidney problems-dialysis patient  . Nsaids Other (See Comments)    Kidney problems-dialysis patient    Antimicrobials this admission: Cefepime 12/14 >> Vanc 12/14>> Levofloxacin 12/14 x1  Dose adjustments this admission: n/a  Microbiology results: 12/14 BCx: sent 12/14 Sputum: sent 12/14 Respiratory panel: sent  Thank you for allowing pharmacy to be a part of this patient's care.  Zackarie Chason D. Komal Stangelo, PharmD, BCPS Clinical Pharmacist Pager: [phone] 11/23/2016 2:52 PM

## 2016-11-23 NOTE — ED Triage Notes (Signed)
Pt to ED by EMS c/o altered mental status and weakness at home. Pt is a dialysis pt and scheduled MWF. Pt missed dialysis on 12/4 and 12/13 due to feeling too ill. Pt appears lethargic. Inspiratory and Expiratory Wheezing noted with a nonproductive cough

## 2016-11-23 NOTE — Progress Notes (Signed)
Patient arrived on unit from ED. Wife at bedside.  Telemetry placed per MD order and CMT notified.  

## 2016-11-23 NOTE — Progress Notes (Signed)
[facility] notified that patient was having leg cramps new order received and patient bp at that time was 182/42. After Flexeril 5mg  was given and patient was calm bp was rechecked 178/57 HR 76 . Arthor Captain LPN

## 2016-11-23 NOTE — ED Provider Notes (Signed)
[facility] DEPT Provider Note   CSN: [id number] Arrival date & time: 11/23/16  0543     History   Chief Complaint Chief Complaint  Patient presents with  . Weakness    HPI Cameron Mendez is a 70 y.o. male.  HPI Cameron Mendez is a 71 y.o. male with hx of COPD, CHF, Cardiac arrest, HTN, ESRD on dialysis, presents to ed with cough, congestion, weakness, altered mental status. Cameron Mendez history provided by pt's wife. Pt with "cold" symptoms for 2 weeks. States cough worsened this morning. Pt with decreased appetite, no PO intake in 2 days. Generalized weakness. This morning, pt seemed to be confused. Wife denies pt having any fever, chills, vomiting, diarrhea, abdominal pain. Pt has not tried any treatment at home. Missed dialysis yesterday because "felt too sick."  Also missed dialysis on 12/4. Had full session Sunday.   Past Medical History:  Diagnosis Date  . Anemia   . Arthritis    Gout  . Blind   . BPH (benign prostatic hyperplasia)   . Cardiac arrest ([place])   . CHF (congestive heart failure) ([place])   . CKD (chronic kidney disease), stage IV ([place])    a. L upper extremity AV fistula created 07/2012.  . Colon polyps   . COPD (chronic obstructive pulmonary disease) ([place])   . Diastolic dysfunction   . Gangrene of finger ([place])    a. 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  . GI (gastrointestinal bleed) feb 2016  . Hypertension    dx--"long time"  . Irregular heart rate 03/03/2015  . Pericardial effusion    a. Noted on echo 10/2009. b. Again seen on echo 09/2013.  [name] Retinitis pigmentosa    Blindness--has shunt --placed yrs ago in [place]..    Patient Active Problem List   Diagnosis Date Noted  . Symptomatic anemia 11/18/2015  . Heme positive stool 11/18/2015  . Encephalopathy 11/18/2015  . Acute coronary syndrome ([place]) 09/28/2015  . HCAP (healthcare-associated pneumonia) 09/18/2015  . Anemia of chronic disease 09/18/2015  . Dysphagia 09/18/2015  . Cardiac arrest  ([place]) 09/13/2015  . Acute respiratory failure ([place])   . End-stage renal disease on hemodialysis ([place])   . Atrial fibrillation, currently in sinus rhythm ([place]) 03/03/2015  . Tobacco abuse 01/10/2015  . Pleural effusion 01/09/2015  . GI bleeding 01/08/2015  . Chronic diastolic CHF (congestive heart failure) ([place]) 01/08/2015  . ESRD (end stage renal disease) on dialysis ([place]) 01/08/2015  . GI bleed 01/08/2015  . Blood loss anemia   . Occult blood in stools 12/31/2014  . History of adenomatous polyp of colon 12/31/2014  . suspected COPD with acute exacerbation 09/10/2014  . Other pancytopenia ([place]) 08/21/2014  . Diastolic dysfunction [id number]  . COPD (chronic obstructive pulmonary disease) ([place]) 05/12/2014  . History of pericardiocentesis 04/27/2014  . Anemia 09/18/2013  . Thrombocytopenia, chronic 09/18/2013  . ESRD on hemodialysis ([place]) 09/18/2013  . Essential hypertension, benign 10/04/2009  . BLINDNESS, [place], [place] DEFINITION 10/01/2009  . OSTEOARTHRITIS 10/01/2009  . Gout, unspecified 09/28/2009    Past Surgical History:  Procedure Laterality Date  . AMPUTATION  12/30/2012   Procedure: AMPUTATION DIGIT;  Surgeon: Tennis Must, MD;  Location: [facility];  Service: Orthopedics;  Laterality: Left;  LEFT SMALL FINGER AMPUTATION  . AV FISTULA PLACEMENT  07/15/2012   Procedure: ARTERIOVENOUS (AV) FISTULA CREATION;  Surgeon: Rosetta Posner, MD;  Location: [facility];  Service: Vascular;  Laterality: Left;  . CATARACT EXTRACTION W/ INTRAOCULAR  LENS  IMPLANT, BILATERAL Bilateral   . KNEE LIGAMENT RECONSTRUCTION Left   . PERICARDIAL TAP N/A 09/19/2013   Procedure: PERICARDIAL TAP;  Surgeon: Blane Ohara, MD;  Location: [facility] CATH LAB;  Service: Cardiovascular;  Laterality: N/A;  . PERICARDIOCENTESIS  09/2013       Home Medications    Prior to Admission medications   Medication Sig Start Date End Date Taking? Authorizing Provider  acetaminophen (TYLENOL) 500 MG tablet  Take 1,000 mg by mouth every 6 (six) hours as needed for moderate pain.    Historical Provider, MD  albuterol (PROVENTIL HFA;VENTOLIN HFA) 108 (90 BASE) MCG/ACT inhaler Inhale 1-2 puffs into the lungs every 6 (six) hours as needed for wheezing or shortness of breath.    Historical Provider, MD  allopurinol (ZYLOPRIM) 100 MG tablet Take 100 mg by mouth daily.     Historical Provider, MD  aspirin 81 MG tablet Take 81 mg by mouth daily.    Historical Provider, MD  carvedilol (COREG) 12.5 MG tablet Take 1 tablet (12.5 mg total) by mouth 2 (two) times daily with a meal. 09/21/15   Bonnielee Haff, MD  diltiazem (CARDIZEM CD) 120 MG 24 hr capsule Take 1 capsule (120 mg total) by mouth daily. 10/01/15   Dixie Dials, MD  diphenhydrAMINE (BENADRYL) 25 MG tablet Take 25 mg by mouth every 6 (six) hours as needed for itching or allergies (congestion).     Historical Provider, MD  doxercalciferol (HECTOROL) 4 MCG/2ML injection Inject 0.5 mLs (1 mcg total) into the vein every Monday, Wednesday, and Friday with hemodialysis. 08/24/14   [name], DO  ferric gluconate 125 mg in sodium chloride 0.9 % 100 mL Inject 125 mg into the vein every Monday, Wednesday, and Friday with hemodialysis. 08/24/14   [name], DO  guaiFENesin (MUCINEX) 600 MG 12 hr tablet Take 1 tablet (600 mg total) by mouth 2 (two) times daily as needed (congestion). 03/04/15   Dixie Dials, MD  levofloxacin (LEVAQUIN) 750 MG tablet Take 1 tablet (750 mg total) by mouth every other day. 11/22/15   [name], MD  Multiple Vitamin (MULTIVITAMIN WITH MINERALS) TABS tablet Take 1 tablet by mouth daily.    Historical Provider, MD  SPIRIVA RESPIMAT 2.5 MCG/ACT AERS INHALE 2 PUFFS INTO THE LUNGS EVERY MORNING 08/03/15   [name], MD  vitamin B-12 1000 MCG tablet Take 1 tablet (1,000 mcg total) by mouth daily. 11/22/15   [name], MD    Family History Family History  Problem Relation Age of Onset  . Ovarian cancer Mother   . Colon  polyps Brother   . Colon cancer Brother   . Kidney disease Neg Hx   . Gallbladder disease Neg Hx   . Esophageal cancer Neg Hx   . Heart disease Neg Hx   . Stomach cancer Neg Hx     Social History Social History  Substance Use Topics  . Smoking status: Former Smoker    Packs/day: 0.50    Years: 50.00    Types: Cigarettes    Quit date: 08/31/2015  . Smokeless tobacco: Never Used     Comment: Pt given handout on how to quit smoking 12/31/14  . Alcohol use No     Comment: "quit driinking in ~ 2000"     Allergies   Ibuprofen and Nsaids   Review of Systems Review of Systems  Constitutional: Positive for chills and fatigue. Negative for fever.  HENT: Positive for congestion and sore throat.  Negative for trouble swallowing.   Respiratory: Positive for cough, chest tightness, shortness of breath and wheezing.   Cardiovascular: Negative for chest pain, palpitations and leg swelling.  Gastrointestinal: Negative for abdominal distention, abdominal pain, diarrhea, nausea and vomiting.  Genitourinary: Negative for dysuria, frequency, hematuria and urgency.  Musculoskeletal: Negative for arthralgias, myalgias, neck pain and neck stiffness.  Skin: Negative for rash.  Allergic/Immunologic: Negative for immunocompromised state.  Neurological: Positive for weakness. Negative for dizziness, light-headedness, numbness and headaches.  All other systems reviewed and are negative.    Physical Exam Updated Vital Signs BP 168/69 (BP Location: Right Arm)   Pulse 74   Temp 97.8 F (36.6 C) (Oral)   Resp 18   SpO2 94%   Physical Exam  Constitutional: He appears well-developed and well-nourished. No distress.  HENT:  Head: Normocephalic and atraumatic.  Eyes: Conjunctivae are normal.  Neck: Neck supple.  Cardiovascular: Normal rate, regular rhythm and normal heart sounds.   Pulmonary/Chest:  Bilateral wheezing, rales, rhonchi  Abdominal: Soft. Bowel sounds are normal. He exhibits no  distension. There is no tenderness. There is no rebound.  Musculoskeletal: He exhibits no edema.  Neurological: He is alert.  Skin: Skin is warm and dry.  Nursing note and vitals reviewed.    ED Treatments / Results  Labs (all labs ordered are listed, but only abnormal results are displayed) Labs Reviewed  I-STAT CHEM 8, ED - Abnormal; Notable for the following:       Result Value   Potassium 5.5 (*)    BUN 63 (*)    Creatinine, Ser 10.20 (*)    Hemoglobin 8.5 (*)    HCT 25.0 (*)    All other components within normal limits  CULTURE, BLOOD (ROUTINE X 2)  CULTURE, BLOOD (ROUTINE X 2)  CBC WITH DIFFERENTIAL/PLATELET  COMPREHENSIVE METABOLIC PANEL  INFLUENZA PANEL BY PCR (TYPE A & B, H1N1)  I-STAT CG4 LACTIC ACID, ED  Randolm Idol, ED    EKG  EKG Interpretation None       Radiology Dg Chest Portable 1 View  Result Date: 11/23/2016 CLINICAL DATA:  70 year old male with shortness of breath and cough. EXAM: PORTABLE CHEST 1 VIEW COMPARISON:  Chest radiograph dated 11/21/2015 FINDINGS: There is stable cardiomegaly. Diffuse interstitial prominence and nodularity may represent mild edema. Right lung base hazy density may represent atelectasis versus infiltrate. A small right pleural effusion noted. There is no pneumothorax. A shunt catheter is noted along the right hemithorax. No acute osseous pathology identified. IMPRESSION: Small right pleural effusion with right lung base atelectasis versus infiltrate. Stable cardiomegaly with possible mild interstitial edema. Electronically Signed   By: Anner Crete M.D.   On: 11/23/2016 06:18    Procedures Procedures (including critical care time)  Medications Ordered in ED Medications  sodium chloride 0.9 % bolus 1,000 mL (not administered)  methylPREDNISolone sodium succinate (SOLU-MEDROL) 125 mg/2 mL injection 125 mg (not administered)  albuterol (PROVENTIL) (2.5 MG/3ML) 0.083% nebulizer solution 5 mg (not administered)    albuterol (PROVENTIL) (2.5 MG/3ML) 0.083% nebulizer solution 5 mg (5 mg Nebulization Given 11/23/16 0558)     Initial Impression / Assessment and Plan / ED Course  I have reviewed the triage vital signs and the nursing notes.  Pertinent labs & imaging results that were available during my care of the patient were reviewed by me and considered in my medical decision making (see chart for details).  Clinical Course    Patient to the emergency department with cough, URI  symptoms for 2 weeks. Today with shortness of breath and confusion. Did not have dialysis yesterday because felt too sick. Will check labs, chest x-ray, will draw blood cultures. Will try breathing treatments, antibiotics, clinical pneumonia suspected. Fluid overloaded and hypertensive, will need dialysis.  Potassium 5.5, no EKG changes. Hemoglobin 7.9, lower than baseline of 9, possibly from fluid overload. Hemoccult negative. Influenza negative. Chest x-ray showing pulmonary vascular congestion and right pleural effusion with possible atelectasis versus infiltrate. Patient receiving Levaquin.   Spoke with [facility], will admit.    Spoke with nephrology, will see him.   Final Clinical Impressions(s) / ED Diagnoses   Final diagnoses:  Community acquired pneumonia, unspecified laterality  SOB (shortness of breath)  Flu-like symptoms  Altered mental status, unspecified altered mental status type    New Prescriptions Current Discharge Medication List       Jeannett Senior, PA-C 11/23/16 1559    Merryl Hacker, MD 11/26/16 2250

## 2016-11-23 NOTE — H&P (Signed)
History and Physical    Cameron Mendez [id number] DOB: 14-Oct-1946 DOA: 11/23/2016   PCP: [name], MD   Patient coming from/Resides with: Private residence  Admission status: Observation (as entered by EDP)/renal floor -medically necessary to stay a minimum 2 midnights to rule out impending and/or unexpected changes in physiologic status that may differ from initial evaluation performed in the ER and/or at time of admission. Presents with dyspnea and cough with abnormal chest x-ray concerning for viral pneumonitis and possible superimposed HCAP, also missed hemodialysis for 2 days and likely is volume overloaded and therefore will require at least 2 consecutive days of hemodialysis. He will require IV antibiotics, IV steroids, frequent neb treatments and monitoring of respiratory status in the event he decompensates.  Chief Complaint: Cough and failure to thrive symptoms  HPI: Cameron Mendez is a 70 y.o. male with medical history significant for atrial fibrillation on anticoagulation, chronic kidney disease on dialysis, hypertension, anemia, chronic thrombocytopenia, legally blind and COPD in setting of ongoing tobacco abuse. Patient has been "sick" with cough and other upper respiratory symptoms without definitive fever for about 2 weeks. Because of the symptoms he felt weak and unable to attend dialysis on 12/4 and 12/13. Yesterday he became more confused and has developed poor oral intake. He has not had any sick contacts, he has not had any diarrhea. His cough is been nonproductive. He has had dyspnea on exertion and orthopnea.  ED Course:  Vital Signs: BP 196/90   Pulse 76   Temp 97.7 F (36.5 C) (Rectal)   Resp 22   SpO2 94%  PCXR: Small right pleural effusion with right lung base atelectasis versus infiltrate, mild interstitial edema versus pneumonitis bilateral bases Lab data: Sodium 139, potassium 5.5, CO2 17, BUN 61, creatinine 9.66, LFTs normal except for albumin 3.3, poc  troponin 0.04, lactic acid 1.26, white count 7700 with neutrophils 82% and absolute neutrophils 6.4%, hemoglobin 7.9, platelets 113,000, glucose 88, blood cultures obtained in the ER, fecal occult blood negative Medications and treatments: Proventil nebulizer 5 mg 2, Solu-Medrol 125 mg IV 1, Levaquin 750 mg IV 1  Review of Systems:  In addition to the HPI above,  No Fever-chills No Headache, changes with Vision or hearing, new weakness, tingling, numbness in any extremity, dizziness, dysarthria or word finding difficulty, gait disturbance or imbalance, tremors or seizure activity No problems swallowing food or Liquids, indigestion/reflux, choking or coughing while eating, abdominal pain with or after eating No Chest pain, palpitations No Abdominal pain, N/V, melena,hematochezia, dark tarry stools No dysuria, malodorous urine, hematuria or flank pain No new skin rashes, lesions, masses or bruises, No new joint pains, aches, swelling or redness No recent unintentional weight gain or loss No polyuria, polydypsia or polyphagia   Past Medical History:  Diagnosis Date  . Anemia   . Arthritis    Gout  . Blind   . BPH (benign prostatic hyperplasia)   . Cardiac arrest ([place])   . CHF (congestive heart failure) ([place])   . CKD (chronic kidney disease), stage IV ([place])    a. L upper extremity AV fistula created 07/2012.  . Colon polyps   . COPD (chronic obstructive pulmonary disease) ([place])   . Diastolic dysfunction   . Gangrene of finger ([place])    a. 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  . GI (gastrointestinal bleed) feb 2016  . Hypertension    dx--"long time"  . Irregular heart rate 03/03/2015  . Pericardial effusion  a. Noted on echo 10/2009. b. Again seen on echo 09/2013.  [name] Retinitis pigmentosa    Blindness--has shunt --placed yrs ago in [place]..    Past Surgical History:  Procedure Laterality Date  . AMPUTATION  12/30/2012   Procedure: AMPUTATION DIGIT;  Surgeon: Tennis Must, MD;  Location: [place] [facility];  Service: Orthopedics;  Laterality: Left;  LEFT SMALL FINGER AMPUTATION  . AV FISTULA PLACEMENT  07/15/2012   Procedure: ARTERIOVENOUS (AV) FISTULA CREATION;  Surgeon: Rosetta Posner, MD;  Location: [facility];  Service: Vascular;  Laterality: Left;  . CATARACT EXTRACTION W/ INTRAOCULAR LENS  IMPLANT, BILATERAL Bilateral   . KNEE LIGAMENT RECONSTRUCTION Left   . PERICARDIAL TAP N/A 09/19/2013   Procedure: PERICARDIAL TAP;  Surgeon: Blane Ohara, MD;  Location: [facility] LAB;  Service: Cardiovascular;  Laterality: N/A;  . PERICARDIOCENTESIS  09/2013    Social History   Social History  . Marital status: Married    Spouse name: N/A  . Number of children: 1  . Years of education: N/A   Occupational History  . unemployeed    Social History Main Topics  . Smoking status: Former Smoker    Packs/day: 0.50    Years: 50.00    Types: Cigarettes    Quit date: 08/31/2015  . Smokeless tobacco: Never Used     Comment: Pt given handout on how to quit smoking 12/31/14  . Alcohol use No     Comment: "quit driinking in ~ 2000"  . Drug use: No  . Sexual activity: Not Currently   Other Topics Concern  . Not on file   Social History Narrative  . No narrative on file    Mobility: Utilizes both cane and rolling walker Work history: Disabled   Allergies  Allergen Reactions  . Ibuprofen Other (See Comments)    Kidney problems-dialysis patient  . Nsaids Other (See Comments)    Kidney problems-dialysis patient    Family History  Problem Relation Age of Onset  . Ovarian cancer Mother   . Colon polyps Brother   . Colon cancer Brother   . Kidney disease Neg Hx   . Gallbladder disease Neg Hx   . Esophageal cancer Neg Hx   . Heart disease Neg Hx   . Stomach cancer Neg Hx      Prior to Admission medications   Medication Sig Start Date End Date Taking? Authorizing Provider  allopurinol (ZYLOPRIM) 100 MG tablet Take 100 mg by mouth daily.    Yes  Historical Provider, MD  aspirin 81 MG tablet Take 81 mg by mouth daily.   Yes Historical Provider, MD  carvedilol (COREG) 12.5 MG tablet Take 1 tablet (12.5 mg total) by mouth 2 (two) times daily with a meal. 09/21/15  Yes Bonnielee Haff, MD  diltiazem (CARDIZEM CD) 120 MG 24 hr capsule Take 1 capsule (120 mg total) by mouth daily. 10/01/15  Yes Dixie Dials, MD  doxercalciferol (HECTOROL) 4 MCG/2ML injection Inject 0.5 mLs (1 mcg total) into the vein every Monday, Wednesday, and Friday with hemodialysis. 08/24/14  Yes [name], DO  ferric gluconate 125 mg in sodium chloride 0.9 % 100 mL Inject 125 mg into the vein every Monday, Wednesday, and Friday with hemodialysis. 08/24/14  Yes [name], DO  guaiFENesin (MUCINEX) 600 MG 12 hr tablet Take 1 tablet (600 mg total) by mouth 2 (two) times daily as needed (congestion). Patient not taking: Reported on 11/23/2016 03/04/15   Dixie Dials,  MD  levofloxacin (LEVAQUIN) 750 MG tablet Take 1 tablet (750 mg total) by mouth every other day. Patient not taking: Reported on 11/23/2016 11/22/15   [name], MD  SPIRIVA RESPIMAT 2.5 MCG/ACT AERS INHALE 2 PUFFS INTO THE LUNGS EVERY MORNING Patient not taking: Reported on 11/23/2016 08/03/15   [name], MD    Physical Exam: Vitals:   11/23/16 0630 11/23/16 0645 11/23/16 0700 11/23/16 0730  BP: (!) 213/65  193/79 196/90  Pulse: 73  74 76  Resp: 18  19 22   Temp:  97.7 F (36.5 C)    TempSrc:  Rectal    SpO2: 95%  100% 94%      Constitutional: NAD, calm, comfortable Eyes: PERRL, lids and conjunctivae normal-Legally blind ENMT: Mucous membranes are moist. Posterior pharynx clear of any exudate or lesions.Normal dentition.  Neck: normal, supple, no masses, no thyromegaly Respiratory: Coarse to auscultation bilaterally with expiratory coarse wheezes and basilar crackles, Normal respiratory effort. Mild tachypnea with increased work of breathing -Wet sounding cough Cardiovascular: Regular  rate and rhythm, no murmurs / rubs / gallops. No extremity edema. 2+ pedal pulses. No carotid bruits. 7 cm JVD bilaterally Abdomen: no tenderness, no masses palpated. No hepatosplenomegaly. Bowel sounds positive.  Musculoskeletal: no clubbing / cyanosis. No joint deformity upper and lower extremities. Good ROM, no contractures. Normal muscle tone.  Skin: no rashes, lesions, ulcers. No induration Neurologic: CN 2-12 grossly intact. Sensation intact, DTR normal. Strength 5/5 x all 4 extremities.  Psychiatric: Normal judgment and insight. Alert and oriented x 3. Normal mood.    Labs on Admission: I have personally reviewed following labs and imaging studies  CBC:  Recent Labs Lab 11/23/16 0610 11/23/16 0618  WBC 7.7  --   NEUTROABS 6.4  --   HGB 7.9* 8.5*  HCT 25.5* 25.0*  MCV 95.1  --   PLT 113*  --    Basic Metabolic Panel:  Recent Labs Lab 11/23/16 0610 11/23/16 0618  NA 139 139  K 5.5* 5.5*  CL 107 110  CO2 17*  --   GLUCOSE 88 85  BUN 61* 63*  CREATININE 9.66* 10.20*  CALCIUM 8.9  --    GFR: CrCl cannot be calculated (Unknown ideal weight.). Liver Function Tests:  Recent Labs Lab 11/23/16 0610  AST 24  ALT 18  ALKPHOS 51  BILITOT 0.7  PROT 7.3  ALBUMIN 3.3*   No results for input(s): LIPASE, AMYLASE in the last 168 hours. No results for input(s): AMMONIA in the last 168 hours. Coagulation Profile: No results for input(s): INR, PROTIME in the last 168 hours. Cardiac Enzymes: No results for input(s): CKTOTAL, CKMB, CKMBINDEX, TROPONINI in the last 168 hours. BNP (last 3 results) No results for input(s): PROBNP in the last 8760 hours. HbA1C: No results for input(s): HGBA1C in the last 72 hours. CBG: No results for input(s): GLUCAP in the last 168 hours. Lipid Profile: No results for input(s): CHOL, HDL, LDLCALC, TRIG, CHOLHDL, LDLDIRECT in the last 72 hours. Thyroid Function Tests: No results for input(s): TSH, T4TOTAL, FREET4, T3FREE, THYROIDAB in  the last 72 hours. Anemia Panel: No results for input(s): VITAMINB12, FOLATE, FERRITIN, TIBC, IRON, RETICCTPCT in the last 72 hours. Urine analysis:    Component Value Date/Time   COLORURINE YELLOW 11/18/2015 0902   APPEARANCEUR CLOUDY (A) 11/18/2015 0902   LABSPEC 1.011 11/18/2015 0902   LABSPEC 1.015 03/09/2009 1116   PHURINE 8.5 (H) 11/18/2015 0902   GLUCOSEU NEGATIVE 11/18/2015 0902   HGBUR SMALL (  A) 11/18/2015 0902   BILIRUBINUR NEGATIVE 11/18/2015 0902   BILIRUBINUR Negative 03/09/2009 1116   KETONESUR NEGATIVE 11/18/2015 0902   PROTEINUR 100 (A) 11/18/2015 0902   UROBILINOGEN 0.2 03/03/2015 1950   NITRITE NEGATIVE 11/18/2015 0902   LEUKOCYTESUR SMALL (A) 11/18/2015 0902   LEUKOCYTESUR Trace 03/09/2009 1116   Sepsis Labs: @LABRCNTIP (procalcitonin:4,lacticidven:4) )No results found for this or any previous visit (from the past 240 hour(s)).   Radiological Exams on Admission: Dg Chest Portable 1 View  Result Date: 11/23/2016 CLINICAL DATA:  70 year old male with shortness of breath and cough. EXAM: PORTABLE CHEST 1 VIEW COMPARISON:  Chest radiograph dated 11/21/2015 FINDINGS: There is stable cardiomegaly. Diffuse interstitial prominence and nodularity may represent mild edema. Right lung base hazy density may represent atelectasis versus infiltrate. A small right pleural effusion noted. There is no pneumothorax. A shunt catheter is noted along the right hemithorax. No acute osseous pathology identified. IMPRESSION: Small right pleural effusion with right lung base atelectasis versus infiltrate. Stable cardiomegaly with possible mild interstitial edema. Electronically Signed   By: Anner Crete M.D.   On: 11/23/2016 06:18    EKG: (Independently reviewed) Sinus rhythm with ventricular rate 75 bpm, QTC 445 ms, nonspecific ST changes in inferior lateral leads unchanged from previous  Assessment/Plan Principal Problem:   Acute dyspnea/Cough -Patient presents with over 2 weeks  of upper respiratory infection symptoms with nonproductive cough and without fevers with resultant inability to attend dialysis for at least 2 sessions-no resting hypoxemia presentation -Differential includes viral pneumonitis versus HCAP with likely superimposed volume overload related to missed dialysis -Suspect a degree of COPD exacerbation as well -Treat underlying causes -Monitor for hypoxemia -Supportive care with oxygen prn -Oral antitussive agents  Active Problems:   HCAP -Patient presents with cough and respiratory symptoms as described above ongoing for 2 weeks -No fever or leukocytosis but does have elevated neutrophil count (?steroids) -Dialysis patient so empiric broad-spectrum antibiotics but anticipate can narrow quickly -Respiratory viral panel -Procalcitonin -Urinary legionella and strep (if able to obtain specimen from dialysis patient) -Follow up on blood cultures    Volume overload/  ESRD on hemodialysis  -Has missed 2 dialysis sessions -Likely will undergo dialysis today -2 view chest x-ray in a.m. to monitor for improvements after dialysis -Typical dialysis days are MWF -Nephrology consulted by EDP    COPD (chronic obstructive pulmonary disease) -Patient had stopped smoking for 1 year but recently resumed -IV steroids with transition to prednisone -Duo nebs     Essential hypertension, benign -Uncontrolled in likely related to volume overload or missed dialysis -Continue preadmission carvedilol and Cardizem    Atrial fibrillation, currently in sinus rhythm  -Continues to maintain sinus rhythm and was not on anticoagulation prior to admission    BLINDNESS, LEGAL, [place] DEFINITION -Mobilize with assistance    Anemia -Baseline hemoglobin 9.6 -Current hemoglobin 7.9 and likely reflective of hemodilution from volume overload    Thrombocytopenia  -Chronic in nature, stable and greater than 100,000       DVT prophylaxis: Subcutaneous heparin as long as  platelets remain greater than 100,000 Code Status: Full  Family Communication: Wife at bedside Disposition Plan: Anticipate discharge back to preadmission home environment once medically stable Consults called: Nephrology/contacted by EDP at 821 am    Accalia Rigdon L. ANP-BC [facility] Pager [phone]   If 7PM-7AM, please contact night-coverage [email or web address] Password [email or web address]  11/23/2016, 8:07 AM

## 2016-11-23 NOTE — Progress Notes (Signed)
Patient arrived to unit per bed.  Reviewed treatment plan and this RN agrees.  Report received from bedside RN, Wells Guiles.  Consent obtained.  Patient Alert to self, situation. Lung sounds coarse to ausculation in all fields. Generalized edema. Cardiac: NSR.  Prepped LUAVF with alcohol and cannulated with two 15 gauge needles.  Pulsation of blood noted.  Flushed access well with saline per protocol.  Connected and secured lines and initiated tx at 1304.  UF goal of 4500 mL and net fluid removal of 4000 mL.  Will continue to monitor.

## 2016-11-23 NOTE — Consult Note (Signed)
Renal Service Consult Note [place] 11/23/2016 [place] Requesting Physician:  Dr Marily Memos  Reason for Consult:  ESRD pt with AMS and feeling weak HPI: The patient is a 70 y.o. year-old with history of a URI/ cold last week.  Felt bad Monday last week and missed HD that day.  This week missed HD yesterday which was Wed.  Has been irritable, agitated , trying to get OOB , usually is not like this per pt's wife.  No fever , chills, no prod cough, no chest pain.  No abd pain, n/v/d.  No ulcers or sores on his body.    Patient grew up in [place], went to [employer] at [place].  Worked at [employer], a water place on [address] but now has been disabled for over 20-30 yrs.  Has retinitis pigmentosa and went blind about 20 yrs ago.  Has been together with his 2nd wife 28 years.  Has no kids from either marriage, has a step-daughter.  Quit smoking yrs ago but recently started back.  No etoh.   HD staff nurse at [facility] says that he has been having c/o's of L shoulder/ elbow/ arm pain at end of HD for quite a while now, they are not sure what the cause is.    ROS  denies CP  no joint pain   no HA  no blurry vision  no rash  no diarrhea  no nausea/ vomiting  no dysuria  no difficulty voiding  no change in urine color    Past Medical History  Past Medical History:  Diagnosis Date  . Anemia   . Arthritis    Gout  . Blind   . BPH (benign prostatic hyperplasia)   . Cardiac arrest ([place])   . CHF (congestive heart failure) ([place])   . CKD (chronic kidney disease), stage IV ([place])    a. L upper extremity AV fistula created 07/2012.  . Colon polyps   . COPD (chronic obstructive pulmonary disease) ([place])   . Diastolic dysfunction   . Gangrene of finger ([place])    a. 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  . GI (gastrointestinal bleed) feb 2016  . Hypertension    dx--"long time"  . Irregular heart rate 03/03/2015  . Pericardial effusion    a.  Noted on echo 10/2009. b. Again seen on echo 09/2013.  [name] Retinitis pigmentosa    Blindness--has shunt --placed yrs ago in [place]..   Past Surgical History  Past Surgical History:  Procedure Laterality Date  . AMPUTATION  12/30/2012   Procedure: AMPUTATION DIGIT;  Surgeon: Tennis Must, MD;  Location: [facility];  Service: Orthopedics;  Laterality: Left;  LEFT SMALL FINGER AMPUTATION  . AV FISTULA PLACEMENT  07/15/2012   Procedure: ARTERIOVENOUS (AV) FISTULA CREATION;  Surgeon: Rosetta Posner, MD;  Location: [place];  Service: Vascular;  Laterality: Left;  . CATARACT EXTRACTION W/ INTRAOCULAR LENS  IMPLANT, BILATERAL Bilateral   . KNEE LIGAMENT RECONSTRUCTION Left   . PERICARDIAL TAP N/A 09/19/2013   Procedure: PERICARDIAL TAP;  Surgeon: Blane Ohara, MD;  [facility] C[facility] LAB;  Service: Cardiovascular;  Laterality: N/A;  . PERICARDIOCENTESIS  09/2013   Family History  Family History  Problem Relation Age of Onset  . Ovarian cancer Mother   . Colon polyps Brother   . Colon cancer Brother   . Kidney disease Neg Hx   . Gallbladder disease Neg Hx   . Esophageal  cancer Neg Hx   . Heart disease Neg Hx   . Stomach cancer Neg Hx    Social History  reports that he has been smoking Cigarettes.  He has a 25.00 pack-year smoking history. He has never used smokeless tobacco. He reports that he does not drink alcohol or use drugs. Allergies  Allergies  Allergen Reactions  . Ibuprofen Other (See Comments)    Kidney problems-dialysis patient  . Nsaids Other (See Comments)    Kidney problems-dialysis patient   Home medications Prior to Admission medications   Medication Sig Start Date End Date Taking? Authorizing Provider  allopurinol (ZYLOPRIM) 100 MG tablet Take 100 mg by mouth daily.    Yes Historical Provider, MD  aspirin 81 MG tablet Take 81 mg by mouth daily.   Yes Historical Provider, MD  carvedilol (COREG) 12.5 MG tablet Take 1 tablet (12.5 mg total) by mouth 2 (two)  times daily with a meal. 09/21/15  Yes Bonnielee Haff, MD  diltiazem (CARDIZEM CD) 120 MG 24 hr capsule Take 1 capsule (120 mg total) by mouth daily. 10/01/15  Yes Dixie Dials, MD  doxercalciferol (HECTOROL) 4 MCG/2ML injection Inject 0.5 mLs (1 mcg total) into the vein every Monday, Wednesday, and Friday with hemodialysis. 08/24/14  Yes [name], DO  ferric gluconate 125 mg in sodium chloride 0.9 % 100 mL Inject 125 mg into the vein every Monday, Wednesday, and Friday with hemodialysis. 08/24/14  Yes [name], DO  guaiFENesin (MUCINEX) 600 MG 12 hr tablet Take 1 tablet (600 mg total) by mouth 2 (two) times daily as needed (congestion). Patient not taking: Reported on 11/23/2016 03/04/15   Dixie Dials, MD  levofloxacin (LEVAQUIN) 750 MG tablet Take 1 tablet (750 mg total) by mouth every other day. Patient not taking: Reported on 11/23/2016 11/22/15   [name], MD  SPIRIVA RESPIMAT 2.5 MCG/ACT AERS INHALE 2 PUFFS INTO THE LUNGS EVERY MORNING Patient not taking: Reported on 11/23/2016 08/03/15   [name], MD   Liver Function Tests  Recent Labs Lab 11/23/16 0610  AST 24  ALT 18  ALKPHOS 51  BILITOT 0.7  PROT 7.3  ALBUMIN 3.3*   No results for input(s): LIPASE, AMYLASE in the last 168 hours. CBC  Recent Labs Lab 11/23/16 0610 11/23/16 0618  WBC 7.7  --   NEUTROABS 6.4  --   HGB 7.9* 8.5*  HCT 25.5* 25.0*  MCV 95.1  --   PLT 113*  --    Basic Metabolic Panel  Recent Labs Lab 11/23/16 0610 11/23/16 0618  NA 139 139  K 5.5* 5.5*  CL 107 110  CO2 17*  --   GLUCOSE 88 85  BUN 61* 63*  CREATININE 9.66* 10.20*  CALCIUM 8.9  --    Iron/TIBC/Ferritin/ %Sat    Component Value Date/Time   IRON 40 (L) 11/18/2015 0955   TIBC 141 (L) 11/18/2015 0955   FERRITIN 1,185 (H) 11/18/2015 0955   IRONPCTSAT 28 11/18/2015 0955   IRONPCTSAT 22 05/18/2009 1203    Vitals:   11/23/16 0700 11/23/16 0730 11/23/16 0800 11/23/16 0925  BP: 193/79 196/90 (!) 200/101 (!)  166/57  Pulse: 74 76 77 66  Resp: 19 22 (!) 27 20  Temp:    97.5 F (36.4 C)  TempSrc:    Axillary  SpO2: 100% 94% 96% 97%  Weight:    69.2 kg (152 lb 8 oz)  Height:    5\' 3"  (1.6 m)   Exam Gen  moaning, not in severe distress No rash, cyanosis or gangrene Sclera anicteric, throat clear No jvd or bruits Chest some rales at the bases, no wheezing RRR no MRG Abd soft ntnd no mass or ascites +bs GU normal male MS no joint effusions or deformity Ext 1-2+ kg edema bilat LE edema / no wounds or ulcers Neuro is alert, Ox 3 , nonfocal   CXR - vasc congestion, early edema on R   ISTAT labs in ED - Na 139  K 5.5 BUN 63  Cr 10.2  Hb 8/5  Glu 85  Home meds: coreg 12.5 bid, cardizem CD 120 qd,  spiriva, allopurinol, asa  Dialysis: [facility]   4h  66kg   2/ 2.25 bath  LUA AVF  Hep none  P4 H- 3ug tiw M- 225 ug last 11/29 SP recent Fe load , last dose 11/15   Assessment: 1. Confusion/ gen'd weakness - has missed 2 HD sessions in last two weeks, also with early pulm edema/ rales/ abnormal CXR.  Prob AMS from resp status and uremia.  Plan HD now.  2. ESRD usual HD mwf 3. Blindness d/t RP 4. COPD started smokign again recently 56. HTN - takes coreg/ dilt at home    Plan - HD acutely now  Kelly Splinter MD [place] pager [phone]   11/23/2016, 12:29 PM

## 2016-11-23 NOTE — Progress Notes (Signed)
Spoke with Dr. Jonnie Finner concerning hemodialysis.  He will round on the patient today.

## 2016-11-23 NOTE — Progress Notes (Signed)
Patient confused and yelling.  Unable to redirect nor console.  Patient trying to get out of bed hollering "Help, Help". MD notified.

## 2016-11-24 ENCOUNTER — Inpatient Hospital Stay (HOSPITAL_COMMUNITY): Payer: Medicare Other

## 2016-11-24 DIAGNOSIS — R0600 Dyspnea, unspecified: Secondary | ICD-10-CM

## 2016-11-24 LAB — LEGIONELLA PNEUMOPHILA SEROGP 1 UR AG: L. pneumophila Serogp 1 Ur Ag: NEGATIVE

## 2016-11-24 LAB — CBC
HCT: 24.4 % — ABNORMAL LOW (ref 39.0–52.0)
Hemoglobin: 7.6 g/dL — ABNORMAL LOW (ref 13.0–17.0)
MCH: 29.2 pg (ref 26.0–34.0)
MCHC: 31.1 g/dL (ref 30.0–36.0)
MCV: 93.8 fL (ref 78.0–100.0)
PLATELETS: 85 10*3/uL — AB (ref 150–400)
RBC: 2.6 MIL/uL — ABNORMAL LOW (ref 4.22–5.81)
RDW: 18.4 % — AB (ref 11.5–15.5)
WBC: 5.3 10*3/uL (ref 4.0–10.5)

## 2016-11-24 LAB — RENAL FUNCTION PANEL
Albumin: 3 g/dL — ABNORMAL LOW (ref 3.5–5.0)
Anion gap: 15 (ref 5–15)
BUN: 35 mg/dL — AB (ref 6–20)
CHLORIDE: 96 mmol/L — AB (ref 101–111)
CO2: 24 mmol/L (ref 22–32)
CREATININE: 5.27 mg/dL — AB (ref 0.61–1.24)
Calcium: 8.7 mg/dL — ABNORMAL LOW (ref 8.9–10.3)
GFR calc Af Amer: 12 mL/min — ABNORMAL LOW (ref >60)
GFR, EST NON AFRICAN AMERICAN: 10 mL/min — AB (ref >60)
Glucose, Bld: 110 mg/dL — ABNORMAL HIGH (ref 65–99)
Phosphorus: 7.3 mg/dL — ABNORMAL HIGH (ref 2.5–4.6)
Potassium: 3.8 mmol/L (ref 3.5–5.1)
Sodium: 135 mmol/L (ref 135–145)

## 2016-11-24 LAB — PREPARE RBC (CROSSMATCH)

## 2016-11-24 MED ORDER — HEPARIN SODIUM (PORCINE) 1000 UNIT/ML DIALYSIS: 1000.0000 [IU] | INTRAMUSCULAR | Status: DC | PRN

## 2016-11-24 MED ORDER — VANCOMYCIN HCL IN DEXTROSE 750-5 MG/150ML-% IV SOLN
INTRAVENOUS | Status: AC
  Administered 2016-11-24: 750 mg via INTRAVENOUS
  Filled 2016-11-24: qty 150

## 2016-11-24 MED ORDER — LEVOFLOXACIN 500 MG PO TABS
500.0000 mg | ORAL_TABLET | ORAL | Status: DC
  Filled 2016-11-24: qty 1

## 2016-11-24 MED ORDER — LIDOCAINE HCL (PF) 1 % IJ SOLN: 5.0000 mL | INTRAMUSCULAR | Status: DC | PRN

## 2016-11-24 MED ORDER — SODIUM CHLORIDE 0.9 % IV SOLN: Freq: Once | INTRAVENOUS | Status: DC

## 2016-11-24 MED ORDER — PENTAFLUOROPROP-TETRAFLUOROETH EX AERO: 1.0000 "application " | INHALATION_SPRAY | CUTANEOUS | Status: DC | PRN

## 2016-11-24 MED ORDER — LORAZEPAM 2 MG/ML IJ SOLN
INTRAMUSCULAR | Status: AC
  Administered 2016-11-24: 0.5 mg
  Filled 2016-11-24: qty 1

## 2016-11-24 MED ORDER — SODIUM CHLORIDE 0.9 % IV SOLN: 100.0000 mL | INTRAVENOUS | Status: DC | PRN

## 2016-11-24 MED ORDER — LIDOCAINE-PRILOCAINE 2.5-2.5 % EX CREA: 1.0000 "application " | TOPICAL_CREAM | CUTANEOUS | Status: DC | PRN

## 2016-11-24 MED ORDER — LEVOFLOXACIN 500 MG PO TABS
500.0000 mg | ORAL_TABLET | ORAL | Status: DC
  Administered 2016-11-25: 500 mg via ORAL
  Filled 2016-11-24: qty 1

## 2016-11-24 MED ORDER — LORAZEPAM 2 MG/ML IJ SOLN: 0.5000 mg | Freq: Once | INTRAMUSCULAR | Status: AC

## 2016-11-24 NOTE — Progress Notes (Signed)
[facility] Progress Note   Subjective: we got 2kg off yest.  Looks better today, calmer, less agitated and responding more. BP's remain up, didn't fall w HD yest  Vitals:   11/24/16 0900 11/24/16 0915 11/24/16 0930 11/24/16 0945  BP: (!) 165/66 (!) 154/63 (!) 162/71 (!) 173/68  Pulse: 69 71 71 72  Resp:      Temp:      TempSrc:      SpO2:      Weight:      Height:        Inpatient medications: . sodium chloride   Intravenous Once  . allopurinol  100 mg Oral Daily  . aspirin EC  81 mg Oral Daily  . carvedilol  12.5 mg Oral BID WC  . ceFEPime (MAXIPIME) IV  2 g Intravenous Q M,W,F-2000  . diltiazem  120 mg Oral Daily  . heparin  5,000 Units Subcutaneous Q8H  . ipratropium-albuterol  3 mL Nebulization TID  . Vancomycin      . vancomycin  750 mg Intravenous Q M,W,F-HD    sodium chloride, sodium chloride, acetaminophen **OR** acetaminophen, heparin, lidocaine (PF), lidocaine-prilocaine, ondansetron **OR** ondansetron (ZOFRAN) IV, pentafluoroprop-tetrafluoroeth  Exam: Gen much calmer and cooperative today No jvd or bruits Chest clear bilat RRR no MRG Abd soft ntnd no mass or ascites +bs GU normal male MS no joint effusions or deformity Ext 1+ edema bilat LE edema Neuro is alert, Ox 3 , nonfocal   CXR - vasc congestion, early edema on R   ISTAT labs in ED - Na 139  K 5.5 BUN 63  Cr 10.2  Hb 8/5  Glu 85  Home meds: coreg 12.5 bid, cardizem CD 120 qd,  spiriva, allopurinol, asa  Dialysis: [facility]   4h  66kg   2/ 2.25 bath  LUA AVF  Hep none  P4 H- 3ug tiw M- 225 ug last 11/29 SP recent Fe load , last dose 11/15   Assessment: 1. Confusion/ agitation - suspect combination of pulm edema and uremia/ missed HD.  Looks much better today after HD.  Missed 2 HD sessions in last two weeks.  HD again today.  2. Pulm edema - mild/ early, has lost body weight 3. ESRD usual HD [facility] 4. Blindness d/t RP 5. COPD started smokign again recently 39. HTN -  BP's high, on home meds   Plan - HD, get vol down further and lowering dry weight.     Kelly Splinter MD [facility] pager [phone]   11/24/2016, 10:19 AM    Recent Labs Lab 11/23/16 0610 11/23/16 0618 11/24/16 0541  NA 139 139 135  K 5.5* 5.5* 3.8  CL 107 110 96*  CO2 17*  --  24  GLUCOSE 88 85 110*  BUN 61* 63* 35*  CREATININE 9.66* 10.20* 5.27*  CALCIUM 8.9  --  8.7*  PHOS  --   --  7.3*    Recent Labs Lab 11/23/16 0610 11/24/16 0541  AST 24  --   ALT 18  --   ALKPHOS 51  --   BILITOT 0.7  --   PROT 7.3  --   ALBUMIN 3.3* 3.0*    Recent Labs Lab 11/23/16 0610 11/23/16 0618 11/24/16 0541  WBC 7.7  --  5.3  NEUTROABS 6.4  --   --   HGB 7.9* 8.5* 7.6*  HCT 25.5* 25.0* 24.4*  MCV 95.1  --  93.8  PLT 113*  --  85*  Iron/TIBC/Ferritin/ %Sat    Component Value Date/Time   IRON 40 (L) 11/18/2015 0955   TIBC 141 (L) 11/18/2015 0955   FERRITIN 1,185 (H) 11/18/2015 0955   IRONPCTSAT 28 11/18/2015 0955   IRONPCTSAT 22 05/18/2009 1203

## 2016-11-24 NOTE — Progress Notes (Signed)
PROGRESS NOTE    Cameron Mendez  [id number] DOB: 09-21-1946 DOA: 11/23/2016 PCP: [name], MD  Brief Narrative: Cameron Mendez is a 70 y.o. male with medical history significant for atrial fibrillation on anticoagulation, chronic kidney disease on dialysis, hypertension, anemia, chronic thrombocytopenia, legally blind and COPD in setting of ongoing tobacco abuse. Patient has been "sick" with cough and other upper respiratory symptoms without definitive fever for about 2 weeks. Because of the symptoms he felt weak and unable to attend dialysis on 12/4 and 12/13. 12/13 he became more confused and has developed poor oral intake. Developed cough, dyspnea on exertion and orthopnea  Assessment & Plan: Volume overload/Dyspnea -due to volume overload primarily, missed HD x2 -possibly a component of URI/pneumonitis too -extra HD per Renal -STop Vanc and cefepime, change to PO Levaquin -repeat CXR today -FLu and resp viral panel negative -stop steroids, no not suspect COPD flare   Mild Dementia/Blindness -at baseline per Dialysis center    COPD (chronic obstructive pulmonary disease) -stable, no wheezing , nebs PRN    Essential hypertension, benign -Uncontrolled in likely related to volume overload or missed dialysis -Continue carvedilol and Cardizem    Atrial fibrillation, currently in sinus rhythm  -Continues to maintain sinus rhythm and was not on anticoagulation prior to admission    BLINDNESS, LEGAL, [place] DEFINITION -Mobilize with assistance    Anemia of chronic disease -Baseline hemoglobin 9.6, now 7.6, will transfuse 1 unit PRBC -likely reflective of hemodilution from volume overload    Thrombocytopenia  -Chronic in nature, stable and greater than 100,000 -down to 85K, stop heparin  DVT prophylaxis: SCDs due to low platelets Code Status: Full  Family Communication: no family at bedside Disposition Plan: home when improved, 1-2days    Consultants:    Renal  Antimicrobials: Vanc/cefepime    Subjective: Feels ok, breathing better  Objective: Vitals:   11/24/16 1045 11/24/16 1100 11/24/16 1115 11/24/16 1125  BP: (!) 153/60 (!) 144/52 (!) 155/59 (!) 175/71  Pulse: 69 68 70 75  Resp:    18  Temp:    98.5 F (36.9 C)  TempSrc:    Oral  SpO2:      Weight:      Height:        Intake/Output Summary (Last 24 hours) at 11/24/16 1132 Last data filed at 11/24/16 1125  Gross per 24 hour  Intake              350 ml  Output             2000 ml  Net            -1650 ml   Filed Weights   11/23/16 1253 11/23/16 1704 11/24/16 0740  Weight: 69.1 kg (152 lb 5.4 oz) 67.1 kg (147 lb 14.9 oz) 63.2 kg (139 lb 5.3 oz)    Examination:  General exam: Appears calm and comfortable, confused, oriented to self only Respiratory system: few ronchi at bases Cardiovascular system: S1 & S2 heard, RRR. No JVD, murmurs, rubs, gallops or clicks. No pedal edema. Gastrointestinal system: Abdomen is nondistended, soft and nontender.  Normal bowel sounds heard. Central nervous system: Alert and oriented to self only. No focal neurological deficits. Extremities: Symmetric 5 x 5 power. Skin: No rashes, lesions or ulcers Psychiatry: unable to assess    Data Reviewed: I have personally reviewed following labs and imaging studies  CBC:  Recent Labs Lab 11/23/16 0610 11/23/16 0618 11/24/16 0541  WBC 7.7  --  5.3  NEUTROABS 6.4  --   --   HGB 7.9* 8.5* 7.6*  HCT 25.5* 25.0* 24.4*  MCV 95.1  --  93.8  PLT 113*  --  85*   Basic Metabolic Panel:  Recent Labs Lab 11/23/16 0610 11/23/16 0618 11/24/16 0541  NA 139 139 135  K 5.5* 5.5* 3.8  CL 107 110 96*  CO2 17*  --  24  GLUCOSE 88 85 110*  BUN 61* 63* 35*  CREATININE 9.66* 10.20* 5.27*  CALCIUM 8.9  --  8.7*  PHOS  --   --  7.3*   GFR: Estimated Creatinine Clearance: 10.5 mL/min (by C-G formula based on SCr of 5.27 mg/dL (H)). Liver Function Tests:  Recent Labs Lab  11/23/16 0610 11/24/16 0541  AST 24  --   ALT 18  --   ALKPHOS 51  --   BILITOT 0.7  --   PROT 7.3  --   ALBUMIN 3.3* 3.0*   No results for input(s): LIPASE, AMYLASE in the last 168 hours. No results for input(s): AMMONIA in the last 168 hours. Coagulation Profile: No results for input(s): INR, PROTIME in the last 168 hours. Cardiac Enzymes: No results for input(s): CKTOTAL, CKMB, CKMBINDEX, TROPONINI in the last 168 hours. BNP (last 3 results) No results for input(s): PROBNP in the last 8760 hours. HbA1C: No results for input(s): HGBA1C in the last 72 hours. CBG: No results for input(s): GLUCAP in the last 168 hours. Lipid Profile: No results for input(s): CHOL, HDL, LDLCALC, TRIG, CHOLHDL, LDLDIRECT in the last 72 hours. Thyroid Function Tests: No results for input(s): TSH, T4TOTAL, FREET4, T3FREE, THYROIDAB in the last 72 hours. Anemia Panel: No results for input(s): VITAMINB12, FOLATE, FERRITIN, TIBC, IRON, RETICCTPCT in the last 72 hours. Urine analysis:    Component Value Date/Time   COLORURINE YELLOW 11/18/2015 0902   APPEARANCEUR CLOUDY (A) 11/18/2015 0902   LABSPEC 1.011 11/18/2015 0902   LABSPEC 1.015 03/09/2009 1116   PHURINE 8.5 (H) 11/18/2015 0902   GLUCOSEU NEGATIVE 11/18/2015 0902   HGBUR SMALL (A) 11/18/2015 0902   BILIRUBINUR NEGATIVE 11/18/2015 0902   BILIRUBINUR Negative 03/09/2009 1116   KETONESUR NEGATIVE 11/18/2015 0902   PROTEINUR 100 (A) 11/18/2015 0902   UROBILINOGEN 0.2 03/03/2015 1950   NITRITE NEGATIVE 11/18/2015 0902   LEUKOCYTESUR SMALL (A) 11/18/2015 0902   LEUKOCYTESUR Trace 03/09/2009 1116   Sepsis Labs: @LABRCNTIP (procalcitonin:4,lacticidven:4)  ) Recent Results (from the past 240 hour(s))  MRSA PCR Screening     Status: None   Collection Time: 11/23/16  9:47 AM  Result Value Ref Range Status   MRSA by PCR NEGATIVE NEGATIVE Final    Comment:        The GeneXpert MRSA Assay (FDA approved for NASAL specimens only), is one  component of a comprehensive MRSA colonization surveillance program. It is not intended to diagnose MRSA infection nor to guide or monitor treatment for MRSA infections.   Respiratory Panel by PCR     Status: None   Collection Time: 11/23/16  9:47 AM  Result Value Ref Range Status   Adenovirus NOT DETECTED NOT DETECTED Final   Coronavirus 229E NOT DETECTED NOT DETECTED Final   Coronavirus HKU1 NOT DETECTED NOT DETECTED Final   Coronavirus NL63 NOT DETECTED NOT DETECTED Final   Coronavirus OC43 NOT DETECTED NOT DETECTED Final   Metapneumovirus NOT DETECTED NOT DETECTED Final   Rhinovirus / Enterovirus NOT DETECTED NOT DETECTED Final   Influenza A NOT DETECTED NOT DETECTED Final  Influenza B NOT DETECTED NOT DETECTED Final   Parainfluenza Virus 1 NOT DETECTED NOT DETECTED Final   Parainfluenza Virus 2 NOT DETECTED NOT DETECTED Final   Parainfluenza Virus 3 NOT DETECTED NOT DETECTED Final   Parainfluenza Virus 4 NOT DETECTED NOT DETECTED Final   Respiratory Syncytial Virus NOT DETECTED NOT DETECTED Final   Bordetella pertussis NOT DETECTED NOT DETECTED Final   Chlamydophila pneumoniae NOT DETECTED NOT DETECTED Final   Mycoplasma pneumoniae NOT DETECTED NOT DETECTED Final         Radiology Studies: Dg Chest Port 1 View  Result Date: 11/23/2016 CLINICAL DATA:  Dyspnea, CHF, hypertension, stage IV chronic kidney disease EXAM: PORTABLE CHEST 1 VIEW COMPARISON:  Portable exam 1219 hours compared to 11/23/2016 at 0604 hours FINDINGS: VP shunt tubing traverses RIGHT hemithorax. Enlargement of cardiac silhouette with pulmonary vascular congestion. Stable mediastinal contours. Atherosclerotic calcification aorta. Small RIGHT pleural effusion with questionable tiny LEFT pleural effusion. Mild RIGHT basilar opacity, improved from earlier study, question atelectasis or slightly improved edema. Remaining lungs clear. No pneumothorax. Bones demineralized. IMPRESSION: Enlargement of cardiac  silhouette with pulmonary vascular congestion. Small RIGHT pleural effusion with slightly improved aeration since the earlier study question improved atelectasis versus edema. No Electronically Signed   By: Lavonia Dana M.D.   On: 11/23/2016 12:27   Dg Chest Portable 1 View  Result Date: 11/23/2016 CLINICAL DATA:  70 year old male with shortness of breath and cough. EXAM: PORTABLE CHEST 1 VIEW COMPARISON:  Chest radiograph dated 11/21/2015 FINDINGS: There is stable cardiomegaly. Diffuse interstitial prominence and nodularity may represent mild edema. Right lung base hazy density may represent atelectasis versus infiltrate. A small right pleural effusion noted. There is no pneumothorax. A shunt catheter is noted along the right hemithorax. No acute osseous pathology identified. IMPRESSION: Small right pleural effusion with right lung base atelectasis versus infiltrate. Stable cardiomegaly with possible mild interstitial edema. Electronically Signed   By: Anner Crete M.D.   On: 11/23/2016 06:18        Scheduled Meds: . sodium chloride   Intravenous Once  . allopurinol  100 mg Oral Daily  . aspirin EC  81 mg Oral Daily  . carvedilol  12.5 mg Oral BID WC  . ceFEPime (MAXIPIME) IV  2 g Intravenous Q M,W,F-2000  . diltiazem  120 mg Oral Daily  . heparin  5,000 Units Subcutaneous Q8H  . ipratropium-albuterol  3 mL Nebulization TID  . vancomycin  750 mg Intravenous Q M,W,F-HD   Continuous Infusions:   LOS: 1 day    Time spent: 52min     Domenic Polite, MD [facility] Pager [phone]  If 7PM-7AM, please contact night-coverage [email or web address] Password [email or web address] 11/24/2016, 11:32 AM

## 2016-11-25 LAB — BASIC METABOLIC PANEL
Anion gap: 13 (ref 5–15)
BUN: 38 mg/dL — AB (ref 6–20)
CO2: 25 mmol/L (ref 22–32)
CREATININE: 4.51 mg/dL — AB (ref 0.61–1.24)
Calcium: 9 mg/dL (ref 8.9–10.3)
Chloride: 96 mmol/L — ABNORMAL LOW (ref 101–111)
GFR calc non Af Amer: 12 mL/min — ABNORMAL LOW (ref >60)
GFR, EST AFRICAN AMERICAN: 14 mL/min — AB (ref >60)
Glucose, Bld: 104 mg/dL — ABNORMAL HIGH (ref 65–99)
Potassium: 4.7 mmol/L (ref 3.5–5.1)
SODIUM: 134 mmol/L — AB (ref 135–145)

## 2016-11-25 LAB — TYPE AND SCREEN
ABO/RH(D): O POS
Antibody Screen: NEGATIVE
Unit division: 0

## 2016-11-25 LAB — CBC
HEMATOCRIT: 30.4 % — AB (ref 39.0–52.0)
Hemoglobin: 9.5 g/dL — ABNORMAL LOW (ref 13.0–17.0)
MCH: 29.5 pg (ref 26.0–34.0)
MCHC: 31.3 g/dL (ref 30.0–36.0)
MCV: 94.4 fL (ref 78.0–100.0)
PLATELETS: 89 10*3/uL — AB (ref 150–400)
RBC: 3.22 MIL/uL — ABNORMAL LOW (ref 4.22–5.81)
RDW: 19 % — AB (ref 11.5–15.5)
WBC: 9.6 10*3/uL (ref 4.0–10.5)

## 2016-11-25 LAB — PROCALCITONIN: PROCALCITONIN: 2.3 ng/mL

## 2016-11-25 LAB — GLUCOSE, CAPILLARY: Glucose-Capillary: 127 mg/dL — ABNORMAL HIGH (ref 65–99)

## 2016-11-25 MED ORDER — HALOPERIDOL LACTATE 5 MG/ML IJ SOLN
2.0000 mg | Freq: Once | INTRAMUSCULAR | Status: AC
  Administered 2016-11-25: 2 mg via INTRAVENOUS
  Filled 2016-11-25: qty 1

## 2016-11-25 MED ORDER — DARBEPOETIN ALFA 200 MCG/0.4ML IJ SOSY
200.0000 ug | PREFILLED_SYRINGE | INTRAMUSCULAR | Status: DC
  Administered 2016-11-27: 200 ug via INTRAVENOUS
  Filled 2016-11-25: qty 0.4

## 2016-11-25 MED ORDER — NEPRO/CARBSTEADY PO LIQD
237.0000 mL | Freq: Two times a day (BID) | ORAL | Status: DC
  Administered 2016-11-25 – 2016-11-28 (×7): 237 mL via ORAL

## 2016-11-25 MED ORDER — RENA-VITE PO TABS
1.0000 | ORAL_TABLET | Freq: Every day | ORAL | Status: DC
  Administered 2016-11-25 – 2016-11-27 (×3): 1 via ORAL
  Filled 2016-11-25 (×3): qty 1

## 2016-11-25 NOTE — Progress Notes (Signed)
PROGRESS NOTE    Cameron Mendez  [id number] DOB: [birth year] DOA: 11/23/2016 PCP: [name], MD  Brief Narrative: Cameron Mendez is a 70 y.o. male with medical history significant for atrial fibrillation on anticoagulation, chronic kidney disease on dialysis, hypertension, anemia, chronic thrombocytopenia, legally blind and COPD in setting of ongoing tobacco abuse. Patient has been "sick" with cough and other upper respiratory symptoms without definitive fever for about 2 weeks. Because of the symptoms he felt weak and unable to attend dialysis on 12/4 and 12/13. 12/13 he became more confused and has developed poor oral intake. Developed cough, dyspnea on exertion and orthopnea  Assessment & Plan: Volume overload/Dyspnea -due to volume overload primarily, missed HD x2 -possibly a component of URI/pneumonitis too -extra HD per Renal -STopped Vanc and cefepime,  -repeat CXR without pneumonia -FLu and resp viral panel negative -stop steroids, no not suspect COPD flare   Mild Dementia/Blindness -some confusion at baseline per Dialysis center -strongly suspect dementia    COPD (chronic obstructive pulmonary disease) -stable, no wheezing , nebs PRN    Essential hypertension, benign -Uncontrolled in likely related to volume overload or missed dialysis -Continue carvedilol and Cardizem    Atrial fibrillation, currently in sinus rhythm  -Continues to maintain sinus rhythm and was not on anticoagulation prior to admission    BLINDNESS, LEGAL, [place] DEFINITION -Mobilize with assistance    Anemia of chronic disease -Baseline hemoglobin 9.6, now 7.6, will transfuse 1 unit PRBC -likely reflective of hemodilution from volume overload    Thrombocytopenia  -Chronic in nature, stable and greater than 100,000 -down to 85K, stop heparin  DVT prophylaxis: SCDs due to low platelets Code Status: Full  Family Communication: no family at bedside Disposition Plan: home when improved,  1-2days when ok with Renal    Consultants:   Renal  Antimicrobials: Vanc/cefepime    Subjective: Feels ok, breathing better  Objective: Vitals:   11/24/16 2106 11/25/16 0448 11/25/16 0758 11/25/16 0927  BP: 128/74 130/85 (!) 160/75   Pulse: 68 70 64   Resp: 19 20 14    Temp: 98.5 F (36.9 C) 98.8 F (37.1 C) 97.5 F (36.4 C)   TempSrc: Oral Oral Oral   SpO2: 100% 100% 100% 100%  Weight:      Height:        Intake/Output Summary (Last 24 hours) at 11/25/16 1048 Last data filed at 11/25/16 1044  Gross per 24 hour  Intake             1055 ml  Output             1707 ml  Net             -652 ml   Filed Weights   11/23/16 1704 11/24/16 0740 11/24/16 1200  Weight: 67.1 kg (147 lb 14.9 oz) 63.2 kg (139 lb 5.3 oz) 61.8 kg (136 lb 3.9 oz)    Examination:  General exam: Appears calm and comfortable, confused, oriented to self only Respiratory system: few ronchi at bases Cardiovascular system: S1 & S2 heard, RRR. No JVD, murmurs, rubs, gallops or clicks. No pedal edema. Gastrointestinal system: Abdomen is nondistended, soft and nontender.  Normal bowel sounds heard. Central nervous system: Alert and oriented to self only. No focal neurological deficits. Extremities: Symmetric 5 x 5 power. Skin: No rashes, lesions or ulcers Psychiatry: unable to assess    Data Reviewed: I have personally reviewed following labs and imaging studies  CBC:  Recent Labs Lab  11/23/16 0610 11/23/16 0618 11/24/16 0541 11/25/16 0428  WBC 7.7  --  5.3 9.6  NEUTROABS 6.4  --   --   --   HGB 7.9* 8.5* 7.6* 9.5*  HCT 25.5* 25.0* 24.4* 30.4*  MCV 95.1  --  93.8 94.4  PLT 113*  --  85* 89*   Basic Metabolic Panel:  Recent Labs Lab 11/23/16 0610 11/23/16 0618 11/24/16 0541 11/25/16 0427  NA 139 139 135 134*  K 5.5* 5.5* 3.8 4.7  CL 107 110 96* 96*  CO2 17*  --  24 25  GLUCOSE 88 85 110* 104*  BUN 61* 63* 35* 38*  CREATININE 9.66* 10.20* 5.27* 4.51*  CALCIUM 8.9  --  8.7*  9.0  PHOS  --   --  7.3*  --    GFR: Estimated Creatinine Clearance: 12.3 mL/min (by C-G formula based on SCr of 4.51 mg/dL (H)). Liver Function Tests:  Recent Labs Lab 11/23/16 0610 11/24/16 0541  AST 24  --   ALT 18  --   ALKPHOS 51  --   BILITOT 0.7  --   PROT 7.3  --   ALBUMIN 3.3* 3.0*   No results for input(s): LIPASE, AMYLASE in the last 168 hours. No results for input(s): AMMONIA in the last 168 hours. Coagulation Profile: No results for input(s): INR, PROTIME in the last 168 hours. Cardiac Enzymes: No results for input(s): CKTOTAL, CKMB, CKMBINDEX, TROPONINI in the last 168 hours. BNP (last 3 results) No results for input(s): PROBNP in the last 8760 hours. HbA1C: No results for input(s): HGBA1C in the last 72 hours. CBG: No results for input(s): GLUCAP in the last 168 hours. Lipid Profile: No results for input(s): CHOL, HDL, LDLCALC, TRIG, CHOLHDL, LDLDIRECT in the last 72 hours. Thyroid Function Tests: No results for input(s): TSH, T4TOTAL, FREET4, T3FREE, THYROIDAB in the last 72 hours. Anemia Panel: No results for input(s): VITAMINB12, FOLATE, FERRITIN, TIBC, IRON, RETICCTPCT in the last 72 hours. Urine analysis:    Component Value Date/Time   COLORURINE YELLOW 11/18/2015 0902   APPEARANCEUR CLOUDY (A) 11/18/2015 0902   LABSPEC 1.011 11/18/2015 0902   LABSPEC 1.015 03/09/2009 1116   PHURINE 8.5 (H) 11/18/2015 0902   GLUCOSEU NEGATIVE 11/18/2015 0902   HGBUR SMALL (A) 11/18/2015 0902   BILIRUBINUR NEGATIVE 11/18/2015 0902   BILIRUBINUR Negative 03/09/2009 1116   KETONESUR NEGATIVE 11/18/2015 0902   PROTEINUR 100 (A) 11/18/2015 0902   UROBILINOGEN 0.2 03/03/2015 1950   NITRITE NEGATIVE 11/18/2015 0902   LEUKOCYTESUR SMALL (A) 11/18/2015 0902   LEUKOCYTESUR Trace 03/09/2009 1116   Sepsis Labs: @LABRCNTIP (procalcitonin:4,lacticidven:4)  ) Recent Results (from the past 240 hour(s))  Blood culture (routine x 2)     Status: None (Preliminary result)    Collection Time: 11/23/16  6:43 AM  Result Value Ref Range Status   Specimen Description BLOOD RIGHT ANTECUBITAL  Final   Special Requests BOTTLES DRAWN AEROBIC AND ANAEROBIC  5CC  Final   Culture NO GROWTH 1 DAY  Final   Report Status PENDING  Incomplete  Blood culture (routine x 2)     Status: None (Preliminary result)   Collection Time: 11/23/16  7:30 AM  Result Value Ref Range Status   Specimen Description BLOOD RIGHT HAND  Final   Special Requests BOTTLES DRAWN AEROBIC ONLY  10CC  Final   Culture NO GROWTH 1 DAY  Final   Report Status PENDING  Incomplete  MRSA PCR Screening     Status: None  Collection Time: 11/23/16  9:47 AM  Result Value Ref Range Status   MRSA by PCR NEGATIVE NEGATIVE Final    Comment:        The GeneXpert MRSA Assay (FDA approved for NASAL specimens only), is one component of a comprehensive MRSA colonization surveillance program. It is not intended to diagnose MRSA infection nor to guide or monitor treatment for MRSA infections.   Respiratory Panel by PCR     Status: None   Collection Time: 11/23/16  9:47 AM  Result Value Ref Range Status   Adenovirus NOT DETECTED NOT DETECTED Final   Coronavirus 229E NOT DETECTED NOT DETECTED Final   Coronavirus HKU1 NOT DETECTED NOT DETECTED Final   Coronavirus NL63 NOT DETECTED NOT DETECTED Final   Coronavirus OC43 NOT DETECTED NOT DETECTED Final   Metapneumovirus NOT DETECTED NOT DETECTED Final   Rhinovirus / Enterovirus NOT DETECTED NOT DETECTED Final   Influenza A NOT DETECTED NOT DETECTED Final   Influenza B NOT DETECTED NOT DETECTED Final   Parainfluenza Virus 1 NOT DETECTED NOT DETECTED Final   Parainfluenza Virus 2 NOT DETECTED NOT DETECTED Final   Parainfluenza Virus 3 NOT DETECTED NOT DETECTED Final   Parainfluenza Virus 4 NOT DETECTED NOT DETECTED Final   Respiratory Syncytial Virus NOT DETECTED NOT DETECTED Final   Bordetella pertussis NOT DETECTED NOT DETECTED Final   Chlamydophila pneumoniae  NOT DETECTED NOT DETECTED Final   Mycoplasma pneumoniae NOT DETECTED NOT DETECTED Final         Radiology Studies: Dg Chest 2 View  Result Date: 11/24/2016 CLINICAL DATA:  Two weeks of cough and other rep sperm tori symptoms. No definite fever. EXAM: CHEST  2 VIEW COMPARISON:  Portable chest x-ray of November 23, 2016 FINDINGS: The lungs are borderline hypoinflated. The interstitial markings are increased. The pulmonary vascularity is engorged. The cardiac silhouette is enlarged. There is a small right pleural effusion layering posteriorly. There is calcification in the wall of the thoracic aorta. A ventriculoperitoneal shunt tube is present. The observed bony thorax exhibits no acute abnormality. IMPRESSION: CHF with mild pulmonary interstitial edema. Small right pleural effusion. No definite pneumonia. Thoracic aortic atherosclerosis. Electronically Signed   By: David  Martinique M.D.   On: 11/24/2016 13:40   Dg Chest Port 1 View  Result Date: 11/23/2016 CLINICAL DATA:  Dyspnea, CHF, hypertension, stage IV chronic kidney disease EXAM: PORTABLE CHEST 1 VIEW COMPARISON:  Portable exam 1219 hours compared to 11/23/2016 at 0604 hours FINDINGS: VP shunt tubing traverses RIGHT hemithorax. Enlargement of cardiac silhouette with pulmonary vascular congestion. Stable mediastinal contours. Atherosclerotic calcification aorta. Small RIGHT pleural effusion with questionable tiny LEFT pleural effusion. Mild RIGHT basilar opacity, improved from earlier study, question atelectasis or slightly improved edema. Remaining lungs clear. No pneumothorax. Bones demineralized. IMPRESSION: Enlargement of cardiac silhouette with pulmonary vascular congestion. Small RIGHT pleural effusion with slightly improved aeration since the earlier study question improved atelectasis versus edema. No Electronically Signed   By: Lavonia Dana M.D.   On: 11/23/2016 12:27        Scheduled Meds: . sodium chloride   Intravenous Once  .  allopurinol  100 mg Oral Daily  . aspirin EC  81 mg Oral Daily  . carvedilol  12.5 mg Oral BID WC  . [START ON 11/27/2016] darbepoetin (ARANESP) injection - DIALYSIS  200 mcg Intravenous Q Mon-HD  . diltiazem  120 mg Oral Daily  . feeding supplement (NEPRO CARB STEADY)  237 mL Oral BID BM  . ipratropium-albuterol  3 mL Nebulization TID  . levofloxacin  500 mg Oral Q48H  . multivitamin  1 tablet Oral QHS   Continuous Infusions:   LOS: 2 days    Time spent: 43min     Domenic Polite, MD [facility] Pager [phone]  If 7PM-7AM, please contact night-coverage [email or web address] Password [email or web address] 11/25/2016, 10:48 AM

## 2016-11-25 NOTE — Progress Notes (Addendum)
Spoke with wife, at baseline patient is alert and oriented x4 and able to perform ADL's. On Thursday patient was becoming more confused, but oriented to self. Today patient is disoriented x4, needs assistance with ADL's but able to follow commands. Wife does not feel she will be able to care for patient at home.

## 2016-11-25 NOTE — Progress Notes (Signed)
[facility] Progress Note   Subjective:  "I don't know where I am". Awake, cooperative, non-productive hacking cough. No fevers. Denies SOB. On low flow nasal O2.   Objective Vitals:   11/24/16 2106 11/25/16 0448 11/25/16 0758 11/25/16 0927  BP: 128/74 130/85 (!) 160/75   Pulse: 68 70 64   Resp: 19 20 14    Temp: 98.5 F (36.9 C) 98.8 F (37.1 C) 97.5 F (36.4 C)   TempSrc: Oral Oral Oral   SpO2: 100% 100% 100% 100%  Weight:      Height:       Physical Exam General: chronically ill appearing NAD Heart: S1,S2, RRR Lungs: BBS coarse scattered rhonchi upper lung fields. No wheezing. Slightly dec in bases, no rales.  Abdomen: soft, non-tender Extremities: No LE edema Dialysis Access: LUA AVF + bruit     Additional Objective Labs: Basic Metabolic Panel:  Recent Labs Lab 11/23/16 0610 11/23/16 0618 11/24/16 0541 11/25/16 0427  NA 139 139 135 134*  K 5.5* 5.5* 3.8 4.7  CL 107 110 96* 96*  CO2 17*  --  24 25  GLUCOSE 88 85 110* 104*  BUN 61* 63* 35* 38*  CREATININE 9.66* 10.20* 5.27* 4.51*  CALCIUM 8.9  --  8.7* 9.0  PHOS  --   --  7.3*  --    Liver Function Tests:  Recent Labs Lab 11/23/16 0610 11/24/16 0541  AST 24  --   ALT 18  --   ALKPHOS 51  --   BILITOT 0.7  --   PROT 7.3  --   ALBUMIN 3.3* 3.0*   No results for input(s): LIPASE, AMYLASE in the last 168 hours. CBC:  Recent Labs Lab 11/23/16 0610 11/23/16 0618 11/24/16 0541 11/25/16 0428  WBC 7.7  --  5.3 9.6  NEUTROABS 6.4  --   --   --   HGB 7.9* 8.5* 7.6* 9.5*  HCT 25.5* 25.0* 24.4* 30.4*  MCV 95.1  --  93.8 94.4  PLT 113*  --  85* 89*   Blood Culture    Component Value Date/Time   SDES BLOOD RIGHT HAND 11/23/2016 0730   SPECREQUEST BOTTLES DRAWN AEROBIC ONLY  10CC 11/23/2016 0730   CULT NO GROWTH 1 DAY 11/23/2016 0730   REPTSTATUS PENDING 11/23/2016 0730    Cardiac Enzymes: No results for input(s): CKTOTAL, CKMB, CKMBINDEX, TROPONINI in the last 168  hours. CBG: No results for input(s): GLUCAP in the last 168 hours. Iron Studies: No results for input(s): IRON, TIBC, TRANSFERRIN, FERRITIN in the last 72 hours. @lablastinr3 @ Studies/Results: Dg Chest 2 View  Result Date: 11/24/2016 CLINICAL DATA:  Two weeks of cough and other rep sperm tori symptoms. No definite fever. EXAM: CHEST  2 VIEW COMPARISON:  Portable chest x-ray of November 23, 2016 FINDINGS: The lungs are borderline hypoinflated. The interstitial markings are increased. The pulmonary vascularity is engorged. The cardiac silhouette is enlarged. There is a small right pleural effusion layering posteriorly. There is calcification in the wall of the thoracic aorta. A ventriculoperitoneal shunt tube is present. The observed bony thorax exhibits no acute abnormality. IMPRESSION: CHF with mild pulmonary interstitial edema. Small right pleural effusion. No definite pneumonia. Thoracic aortic atherosclerosis. Electronically Signed   By: David  Martinique M.D.   On: 11/24/2016 13:40   Dg Chest Port 1 View  Result Date: 11/23/2016 CLINICAL DATA:  Dyspnea, CHF, hypertension, stage IV chronic kidney disease EXAM: PORTABLE CHEST 1 VIEW COMPARISON:  Portable exam 1219 hours compared to 11/23/2016  at 0604 hours FINDINGS: VP shunt tubing traverses RIGHT hemithorax. Enlargement of cardiac silhouette with pulmonary vascular congestion. Stable mediastinal contours. Atherosclerotic calcification aorta. Small RIGHT pleural effusion with questionable tiny LEFT pleural effusion. Mild RIGHT basilar opacity, improved from earlier study, question atelectasis or slightly improved edema. Remaining lungs clear. No pneumothorax. Bones demineralized. IMPRESSION: Enlargement of cardiac silhouette with pulmonary vascular congestion. Small RIGHT pleural effusion with slightly improved aeration since the earlier study question improved atelectasis versus edema. No Electronically Signed   By: Lavonia Dana M.D.   On: 11/23/2016  12:27   Medications:  . sodium chloride   Intravenous Once  . allopurinol  100 mg Oral Daily  . aspirin EC  81 mg Oral Daily  . carvedilol  12.5 mg Oral BID WC  . diltiazem  120 mg Oral Daily  . ipratropium-albuterol  3 mL Nebulization TID  . levofloxacin  500 mg Oral Q48H   Dialysis:[facility] 4h 66kg 2/ 2.25 bath LUA AVF Hep none P4 H- 3ug tiw M- 225 ug last 11/29 SP recent Fe load , last dose 11/15  Assessment: 1. Confusion/ agitation - due to pulm edema  + uremia (missed HD) on background of significant underlying dementia.. Back to baseline mental status.  Pt is always confused/ disoriented in OP setting.  2.  Pulm edema - mild/ early, has lost body weight. HD yesterday Net UF 1707. Post wt 61.8 kg. Lower EDW on DC. Next HD 11/27/16  3.  Dementia - at baseline, as above 4.  ESRD usual HD mwf 5.  Blindness d/t RP 6.  COPD started smokimg again recently 42.  HTN - HTN on adm. Coreg/dilt resumed. Better control today.  8.  Nutrition: Albumin 3.0 renal diet/nepro/renal vit 9.  Anemia: HGB today ^ 9.5. Rec'd 1 unit PRBC 11/24/16. Missed ESA dose in clinic. Give Monday.   Rita H. Brown NP-C 11/25/2016, 10:03 AM  [facility] [phone]  Pt seen, examined and agree w A/P as above.  Kelly Splinter MD [employer] pager [phone]   11/25/2016, 3:27 PM

## 2016-11-26 LAB — CBC
HEMATOCRIT: 31.2 % — AB (ref 39.0–52.0)
HEMOGLOBIN: 9.7 g/dL — AB (ref 13.0–17.0)
MCH: 28.7 pg (ref 26.0–34.0)
MCHC: 31.1 g/dL (ref 30.0–36.0)
MCV: 92.3 fL (ref 78.0–100.0)
Platelets: 85 10*3/uL — ABNORMAL LOW (ref 150–400)
RBC: 3.38 MIL/uL — ABNORMAL LOW (ref 4.22–5.81)
RDW: 18.4 % — ABNORMAL HIGH (ref 11.5–15.5)
WBC: 9.6 10*3/uL (ref 4.0–10.5)

## 2016-11-26 LAB — GLUCOSE, CAPILLARY: Glucose-Capillary: 106 mg/dL — ABNORMAL HIGH (ref 65–99)

## 2016-11-26 LAB — BASIC METABOLIC PANEL
ANION GAP: 15 (ref 5–15)
BUN: 78 mg/dL — AB (ref 6–20)
CO2: 24 mmol/L (ref 22–32)
Calcium: 8.9 mg/dL (ref 8.9–10.3)
Chloride: 94 mmol/L — ABNORMAL LOW (ref 101–111)
Creatinine, Ser: 6.54 mg/dL — ABNORMAL HIGH (ref 0.61–1.24)
GFR calc Af Amer: 9 mL/min — ABNORMAL LOW (ref >60)
GFR calc non Af Amer: 8 mL/min — ABNORMAL LOW (ref >60)
GLUCOSE: 108 mg/dL — AB (ref 65–99)
POTASSIUM: 4.9 mmol/L (ref 3.5–5.1)
Sodium: 133 mmol/L — ABNORMAL LOW (ref 135–145)

## 2016-11-26 MED ORDER — IPRATROPIUM-ALBUTEROL 0.5-2.5 (3) MG/3ML IN SOLN: 3.0000 mL | RESPIRATORY_TRACT | Status: DC | PRN

## 2016-11-26 NOTE — Progress Notes (Addendum)
During the night, the patient continued to get OOB without calling for assistance.  He also pulled off his telemetry leads, gown and twisted his continuous pulse oximeter tubing and oxygen tubing around his body.  When attempting to put his continuous pulse ox back on, he became more agitated.  Continuous pulse oximeter left off.  SCDs left off because he was becoming tangled up while attempting to get OOB.  He refused his gown but agreed to lay back down and put telemetry back on.  He again became increasingly frustrated and getting OOB.  He was assisted to recliner and taken to front desk for closer observation.  At this point, he is alert but calm.  Will continue to monitor.  [employer] RN-BC, WTA.  Patient refusing oxygen at this time.  Currently, he is 96% on Room Air.  Will continue to monitor.  [employer] RN-BC, WTA.

## 2016-11-26 NOTE — Progress Notes (Signed)
[facility] Progress Note   Subjective:  Had issues with confusion and agitation during night. Was placed at nursing station during night for safety monitoring. Currently sleeping, easily aroused. No C/Os. Per RN, SCD, Telemetry and things that would agitate him has been Dc'd. Stated he got up to chair for Bfast, has been cooperative this AM. Was supposed to go home today but apparently wife called and says she can't take care of him at home anymore. Now planning for DC to SNF.   No C/Os. Oriented X 1.   Objective Vitals:   11/26/16 0228 11/26/16 0647 11/26/16 0803 11/26/16 0851  BP:  (!) 164/54    Pulse:  (!) 57 64   Resp:  17    Temp:  98.3 F (36.8 C)    TempSrc:  Oral    SpO2: 96% 96%  99%  Weight:      Height:       Physical Exam General: chronically ill appearing NAD Heart: S1,S2, RRR Lungs: BBS few scattered rhonchi. No wheezing. No WOB.  Abdomen: soft, non-tender Extremities: No LE edema Dialysis Access: LUA AVF + bruit    Additional Objective Labs: Basic Metabolic Panel:  Recent Labs Lab 11/24/16 0541 11/25/16 0427 11/26/16 0341  NA 135 134* 133*  K 3.8 4.7 4.9  CL 96* 96* 94*  CO2 24 25 24   GLUCOSE 110* 104* 108*  BUN 35* 38* 78*  CREATININE 5.27* 4.51* 6.54*  CALCIUM 8.7* 9.0 8.9  PHOS 7.3*  --   --    Liver Function Tests:  Recent Labs Lab 11/23/16 0610 11/24/16 0541  AST 24  --   ALT 18  --   ALKPHOS 51  --   BILITOT 0.7  --   PROT 7.3  --   ALBUMIN 3.3* 3.0*   No results for input(s): LIPASE, AMYLASE in the last 168 hours. CBC:  Recent Labs Lab 11/23/16 0610  11/24/16 0541 11/25/16 0428 11/26/16 0341  WBC 7.7  --  5.3 9.6 9.6  NEUTROABS 6.4  --   --   --   --   HGB 7.9*  < > 7.6* 9.5* 9.7*  HCT 25.5*  < > 24.4* 30.4* 31.2*  MCV 95.1  --  93.8 94.4 92.3  PLT 113*  --  85* 89* 85*  < > = values in this interval not displayed. Blood Culture    Component Value Date/Time   SDES BLOOD RIGHT HAND 11/23/2016 0730    SPECREQUEST BOTTLES DRAWN AEROBIC ONLY  10CC 11/23/2016 0730   CULT NO GROWTH 2 DAYS 11/23/2016 0730   REPTSTATUS PENDING 11/23/2016 0730    Cardiac Enzymes: No results for input(s): CKTOTAL, CKMB, CKMBINDEX, TROPONINI in the last 168 hours. CBG:  Recent Labs Lab 11/25/16 2231  GLUCAP 127*   Iron Studies: No results for input(s): IRON, TIBC, TRANSFERRIN, FERRITIN in the last 72 hours. @lablastinr3 @ Studies/Results: Dg Chest 2 View  Result Date: 11/24/2016 CLINICAL DATA:  Two weeks of cough and other rep sperm tori symptoms. No definite fever. EXAM: CHEST  2 VIEW COMPARISON:  Portable chest x-ray of November 23, 2016 FINDINGS: The lungs are borderline hypoinflated. The interstitial markings are increased. The pulmonary vascularity is engorged. The cardiac silhouette is enlarged. There is a small right pleural effusion layering posteriorly. There is calcification in the wall of the thoracic aorta. A ventriculoperitoneal shunt tube is present. The observed bony thorax exhibits no acute abnormality. IMPRESSION: CHF with mild pulmonary interstitial edema. Small right pleural effusion.  No definite pneumonia. Thoracic aortic atherosclerosis. Electronically Signed   By: David  Martinique M.D.   On: 11/24/2016 13:40   Medications:  . sodium chloride   Intravenous Once  . allopurinol  100 mg Oral Daily  . aspirin EC  81 mg Oral Daily  . carvedilol  12.5 mg Oral BID WC  . [START ON 11/27/2016] darbepoetin (ARANESP) injection - DIALYSIS  200 mcg Intravenous Q Mon-HD  . diltiazem  120 mg Oral Daily  . feeding supplement (NEPRO CARB STEADY)  237 mL Oral BID BM  . ipratropium-albuterol  3 mL Nebulization TID  . multivitamin  1 tablet Oral QHS      Dialysis:[facility] 4h 66kg 2/ 2.25 bath LUA AVF Hep none P4 H- 3ug tiw M- 225 ug last 11/29 SP recent Fe load , last dose 11/15  Assessment: 1. Confusion/ agitation- resolved with HD and rx of pulm edema/ uremia. Back to baseline  dementia/ confusion. Wife can't care of patient at home, for [facility] tomorrow. 2.  Dementia - at baseline, as above 3.  ESRD usual HD mwf. Next HD tomorrow if still in hospital. K+ 4.9-2.0 K bath.  4.  Blindness d/t RP 5.  COPD started smoking again recently 74.  HTN/Volume - HTN on adm. Coreg/dilt resumed. High again today. HD 11/24/16 Post wt 61.8 kg (wt of 79kg is not accurate) Is below OP EDW. Last wt Lower on DC to 62-62.5 kg.   7.  Nutrition: Albumin 3.0 renal diet/nepro/renal vit 8.  Anemia: HGB today ^ 9.7. Rec'd 1 unit PRBC 11/24/16. Missed ESA dose in clinic. Give Monday.   Plan - Probable DC to SNF tomorrow when arrangements are in place.   Rita H. Brown NP-C 11/26/2016, 9:38 AM  [facility] [phone]  Pt seen, examined and agree w A/P as above.  Kelly Splinter MD [employer] pager [phone]   11/26/2016, 2:52 PM

## 2016-11-26 NOTE — Evaluation (Signed)
Physical Therapy Evaluation Patient Details Name: Cameron Mendez MRN: [id number] DOB: Apr 12, 1946 Today's Date: 11/26/2016   History of Present Illness  Patient is a 70 yo male admitted 11/23/16 with dyspnea.  Patient with pulmonary edema - missed HD - and with HTN.  Patient also with increased confusion and agitation pm of 11/25/16.   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  Will benefit from acute PT to maximize functional mobility prior to discharge.  Recommend SNF at d/c for continued therapy.    Follow Up Recommendations SNF;Supervision/Assistance - 24 hour    Equipment Recommendations  None recommended by PT    Recommendations for Other Services       Precautions / Restrictions Precautions Precautions: Fall Restrictions Weight Bearing Restrictions: No      Mobility  Bed Mobility Overal bed mobility: Needs Assistance Bed Mobility: Supine to Sit;Sit to Supine     Supine to sit: Supervision Sit to supine: Supervision   General bed mobility comments: Supervision for safety only.  No physical assist needed.  Transfers Overall transfer level: Needs assistance Equipment used: 1 person hand held assist Transfers: Sit to/from Stand Sit to Stand: Min assist         General transfer comment: Assist to steady during transfers.  Ambulation/Gait Ambulation/Gait assistance: Min assist Ambulation Distance (Feet): 30 Feet Assistive device: 1 person hand held assist Gait Pattern/deviations: Step-through pattern;Decreased stride length;Shuffle;Drifts right/left;Trunk flexed Gait velocity: decreased Gait velocity interpretation: Below normal speed for age/gender General Gait Details: Verbal cues for directions to avoid obstacles.  Patient with unsteady gait, requiring UE support and assist due to loss of balance.  Stairs            Wheelchair  Mobility    Modified Rankin (Stroke Patients Only)       Balance Overall balance assessment: Needs assistance         Standing balance support: Single extremity supported Standing balance-Leahy Scale: Poor                               Pertinent Vitals/Pain Pain Assessment: No/denies pain    Home Living Family/patient expects to be discharged to:: Skilled nursing facility Living Arrangements: Spouse/significant other             Home Equipment: Walker - 2 wheels;Cane - single point;Wheelchair - manual Additional Comments: Wife works    Prior Function Level of Independence: Independent with assistive device(s);Needs assistance   Gait / Transfers Assistance Needed: Patient was able to ambulate with cane inside home.  Assist required outside of home, along with use of w/c.  ADL's / Homemaking Assistance Needed: Required at least set-up assist for ADL's.        Hand Dominance   Dominant Hand: Right    Extremity/Trunk Assessment   Upper Extremity Assessment Upper Extremity Assessment: Defer to OT evaluation    Lower Extremity Assessment Lower Extremity Assessment: Generalized weakness       Communication   Communication: No difficulties  Cognition Arousal/Alertness: Awake/alert Behavior During Therapy: WFL for tasks assessed/performed Overall Cognitive Status: Impaired/Different from baseline Area of Impairment: Orientation;Memory;Safety/judgement;Following commands;Awareness;Problem solving Orientation Level: Disoriented to;Time;Situation   Memory: Decreased short-term memory Following Commands: Follows one step commands with increased time Safety/Judgement: Decreased awareness of safety   Problem Solving: Slow processing;Requires verbal cues General Comments: Per wife,  patient has fluctuations with cognition during day/night.    General Comments      Exercises     Assessment/Plan    PT Assessment Patient needs continued PT  services  PT Problem List Decreased strength;Decreased activity tolerance;Decreased balance;Decreased mobility;Decreased cognition;Decreased knowledge of use of DME;Decreased safety awareness;Cardiopulmonary status limiting activity          PT Treatment Interventions DME instruction;Gait training;Functional mobility training;Therapeutic activities;Therapeutic exercise;Balance training;Cognitive remediation;Patient/family education    PT Goals (Current goals can be found in the Care Plan section)  Acute Rehab PT Goals Patient Stated Goal: none stated PT Goal Formulation: With patient/family Time For Goal Achievement: 12/03/16 Potential to Achieve Goals: Good    Frequency Min 2X/week   Barriers to discharge        Co-evaluation               End of Session Equipment Utilized During Treatment: Gait belt Activity Tolerance: Patient tolerated treatment well;Patient limited by fatigue Patient left: in bed;with call bell/phone within reach;with bed alarm set;with family/visitor present           Time: [id number] PT Time Calculation (min) (ACUTE ONLY): 16 min   Charges:   PT Evaluation $PT Eval Moderate Complexity: 1 Procedure     PT G Codes:        Despina Pole 12/05/16, 6:49 PM Carita Pian. Sanjuana Kava, [place] Pager [phone]

## 2016-11-26 NOTE — Progress Notes (Signed)
PROGRESS NOTE    [name]  [id number] DOB: Nov 10, 1946 DOA: 11/23/2016 PCP: [name], MD  Brief Narrative: Cameron Mendez is a 70 y.o. male with medical history significant for atrial fibrillation on anticoagulation, chronic kidney disease on dialysis, hypertension, anemia, chronic thrombocytopenia, legally blind and COPD in setting of ongoing tobacco abuse. Patient has been "sick" with cough and other upper respiratory symptoms without definitive fever for about 2 weeks. Because of the symptoms he felt weak and unable to attend dialysis on 12/4 and 12/13. 12/13 he became more confused and has developed poor oral intake. Developed cough, dyspnea on exertion and orthopnea  Assessment & Plan: Volume overload/Dyspnea -due to volume overload primarily, missed HD x2 -possibly a component of URI/pneumonitis too -s/p extra HD per Renal -Stopped Vanc and cefepime,  -repeat CXR without pneumonia -FLu and resp viral panel negative -stop steroids, do not suspect COPD flare -improved and stable, wife states that she is unable to care for him at home hence will need SNF   Mild Dementia/Blindness -some confusion at baseline per Dialysis center -strongly suspect dementia -wife unable to care for pt    COPD (chronic obstructive pulmonary disease) -stable, no wheezing, nebs PRN    Essential hypertension, benign -Uncontrolled in likely related to volume overload or missed dialysis -Continue carvedilol and Cardizem    Atrial fibrillation, currently in sinus rhythm  -Continues to maintain sinus rhythm and was not on anticoagulation prior to admission    BLINDNESS, LEGAL, [place] DEFINITION -Mobilize with assistance    Anemia of chronic disease -Baseline hemoglobin 9.6, now 7.6, will transfuse 1 unit PRBC -likely reflective of hemodilution from volume overload    Thrombocytopenia  -Chronic in nature, stable and greater than 100,000 -down to 85K, stopped heparin  DVT  prophylaxis: SCDs due to low platelets Code Status: Full  Family Communication: no family at bedside Disposition Plan: SNF tomorrow    Consultants:   Renal  Antimicrobials: Vanc/cefepime    Subjective: Feels ok, agitation and confusion last pm  Objective: Vitals:   11/26/16 0647 11/26/16 0803 11/26/16 0851 11/26/16 1000  BP: (!) 164/54   (!) 131/49  Pulse: (!) 57 64  (!) 57  Resp: 17   20  Temp: 98.3 F (36.8 C)   97.7 F (36.5 C)  TempSrc: Oral     SpO2: 96%  99% 96%  Weight:      Height:        Intake/Output Summary (Last 24 hours) at 11/26/16 1152 Last data filed at 11/26/16 0804  Gross per 24 hour  Intake              420 ml  Output              100 ml  Net              320 ml   Filed Weights   11/24/16 0740 11/24/16 1200 11/25/16 2105  Weight: 63.2 kg (139 lb 5.3 oz) 61.8 kg (136 lb 3.9 oz) 79 kg (174 lb 2.6 oz)    Examination:  General exam: Appears calm and comfortable, oriented to self only Respiratory system: few ronchi at bases Cardiovascular system: S1 & S2 heard, RRR. No JVD, murmurs, rubs, gallops or clicks. No pedal edema. Gastrointestinal system: Abdomen is nondistended, soft and nontender.  Normal bowel sounds heard. Central nervous system: Alert and oriented to self only. No focal neurological deficits. Extremities: Symmetric 5 x 5 power. Skin: No rashes, lesions or ulcers  Psychiatry: unable to assess    Data Reviewed: I have personally reviewed following labs and imaging studies  CBC:  Recent Labs Lab 11/23/16 0610 11/23/16 0618 11/24/16 0541 11/25/16 0428 11/26/16 0341  WBC 7.7  --  5.3 9.6 9.6  NEUTROABS 6.4  --   --   --   --   HGB 7.9* 8.5* 7.6* 9.5* 9.7*  HCT 25.5* 25.0* 24.4* 30.4* 31.2*  MCV 95.1  --  93.8 94.4 92.3  PLT 113*  --  85* 89* 85*   Basic Metabolic Panel:  Recent Labs Lab 11/23/16 0610 11/23/16 0618 11/24/16 0541 11/25/16 0427 11/26/16 0341  NA 139 139 135 134* 133*  K 5.5* 5.5* 3.8 4.7 4.9  CL  107 110 96* 96* 94*  CO2 17*  --  24 25 24   GLUCOSE 88 85 110* 104* 108*  BUN 61* 63* 35* 38* 78*  CREATININE 9.66* 10.20* 5.27* 4.51* 6.54*  CALCIUM 8.9  --  8.7* 9.0 8.9  PHOS  --   --  7.3*  --   --    GFR: Estimated Creatinine Clearance: 9.8 mL/min (by C-G formula based on SCr of 6.54 mg/dL (H)). Liver Function Tests:  Recent Labs Lab 11/23/16 0610 11/24/16 0541  AST 24  --   ALT 18  --   ALKPHOS 51  --   BILITOT 0.7  --   PROT 7.3  --   ALBUMIN 3.3* 3.0*   No results for input(s): LIPASE, AMYLASE in the last 168 hours. No results for input(s): AMMONIA in the last 168 hours. Coagulation Profile: No results for input(s): INR, PROTIME in the last 168 hours. Cardiac Enzymes: No results for input(s): CKTOTAL, CKMB, CKMBINDEX, TROPONINI in the last 168 hours. BNP (last 3 results) No results for input(s): PROBNP in the last 8760 hours. HbA1C: No results for input(s): HGBA1C in the last 72 hours. CBG:  Recent Labs Lab 11/25/16 2231 11/26/16 0919  GLUCAP 127* 106*   Lipid Profile: No results for input(s): CHOL, HDL, LDLCALC, TRIG, CHOLHDL, LDLDIRECT in the last 72 hours. Thyroid Function Tests: No results for input(s): TSH, T4TOTAL, FREET4, T3FREE, THYROIDAB in the last 72 hours. Anemia Panel: No results for input(s): VITAMINB12, FOLATE, FERRITIN, TIBC, IRON, RETICCTPCT in the last 72 hours. Urine analysis:    Component Value Date/Time   COLORURINE YELLOW 11/18/2015 0902   APPEARANCEUR CLOUDY (A) 11/18/2015 0902   LABSPEC 1.011 11/18/2015 0902   LABSPEC 1.015 03/09/2009 1116   PHURINE 8.5 (H) 11/18/2015 0902   GLUCOSEU NEGATIVE 11/18/2015 0902   HGBUR SMALL (A) 11/18/2015 0902   BILIRUBINUR NEGATIVE 11/18/2015 0902   BILIRUBINUR Negative 03/09/2009 1116   KETONESUR NEGATIVE 11/18/2015 0902   PROTEINUR 100 (A) 11/18/2015 0902   UROBILINOGEN 0.2 03/03/2015 1950   NITRITE NEGATIVE 11/18/2015 0902   LEUKOCYTESUR SMALL (A) 11/18/2015 0902   LEUKOCYTESUR Trace  03/09/2009 1116   Sepsis Labs: @LABRCNTIP (procalcitonin:4,lacticidven:4)  ) Recent Results (from the past 240 hour(s))  Blood culture (routine x 2)     Status: None (Preliminary result)   Collection Time: 11/23/16  6:43 AM  Result Value Ref Range Status   Specimen Description BLOOD RIGHT ANTECUBITAL  Final   Special Requests BOTTLES DRAWN AEROBIC AND ANAEROBIC  5CC  Final   Culture NO GROWTH 2 DAYS  Final   Report Status PENDING  Incomplete  Blood culture (routine x 2)     Status: None (Preliminary result)   Collection Time: 11/23/16  7:30 AM  Result Value Ref  Range Status   Specimen Description BLOOD RIGHT HAND  Final   Special Requests BOTTLES DRAWN AEROBIC ONLY  10CC  Final   Culture NO GROWTH 2 DAYS  Final   Report Status PENDING  Incomplete  MRSA PCR Screening     Status: None   Collection Time: 11/23/16  9:47 AM  Result Value Ref Range Status   MRSA by PCR NEGATIVE NEGATIVE Final    Comment:        The GeneXpert MRSA Assay (FDA approved for NASAL specimens only), is one component of a comprehensive MRSA colonization surveillance program. It is not intended to diagnose MRSA infection nor to guide or monitor treatment for MRSA infections.   Respiratory Panel by PCR     Status: None   Collection Time: 11/23/16  9:47 AM  Result Value Ref Range Status   Adenovirus NOT DETECTED NOT DETECTED Final   Coronavirus 229E NOT DETECTED NOT DETECTED Final   Coronavirus HKU1 NOT DETECTED NOT DETECTED Final   Coronavirus NL63 NOT DETECTED NOT DETECTED Final   Coronavirus OC43 NOT DETECTED NOT DETECTED Final   Metapneumovirus NOT DETECTED NOT DETECTED Final   Rhinovirus / Enterovirus NOT DETECTED NOT DETECTED Final   Influenza A NOT DETECTED NOT DETECTED Final   Influenza B NOT DETECTED NOT DETECTED Final   Parainfluenza Virus 1 NOT DETECTED NOT DETECTED Final   Parainfluenza Virus 2 NOT DETECTED NOT DETECTED Final   Parainfluenza Virus 3 NOT DETECTED NOT DETECTED Final    Parainfluenza Virus 4 NOT DETECTED NOT DETECTED Final   Respiratory Syncytial Virus NOT DETECTED NOT DETECTED Final   Bordetella pertussis NOT DETECTED NOT DETECTED Final   Chlamydophila pneumoniae NOT DETECTED NOT DETECTED Final   Mycoplasma pneumoniae NOT DETECTED NOT DETECTED Final         Radiology Studies: Dg Chest 2 View  Result Date: 11/24/2016 CLINICAL DATA:  Two weeks of cough and other rep sperm tori symptoms. No definite fever. EXAM: CHEST  2 VIEW COMPARISON:  Portable chest x-ray of November 23, 2016 FINDINGS: The lungs are borderline hypoinflated. The interstitial markings are increased. The pulmonary vascularity is engorged. The cardiac silhouette is enlarged. There is a small right pleural effusion layering posteriorly. There is calcification in the wall of the thoracic aorta. A ventriculoperitoneal shunt tube is present. The observed bony thorax exhibits no acute abnormality. IMPRESSION: CHF with mild pulmonary interstitial edema. Small right pleural effusion. No definite pneumonia. Thoracic aortic atherosclerosis. Electronically Signed   By: David  Martinique M.D.   On: 11/24/2016 13:40        Scheduled Meds: . sodium chloride   Intravenous Once  . allopurinol  100 mg Oral Daily  . aspirin EC  81 mg Oral Daily  . carvedilol  12.5 mg Oral BID WC  . [START ON 11/27/2016] darbepoetin (ARANESP) injection - DIALYSIS  200 mcg Intravenous Q Mon-HD  . diltiazem  120 mg Oral Daily  . feeding supplement (NEPRO CARB STEADY)  237 mL Oral BID BM  . ipratropium-albuterol  3 mL Nebulization TID  . multivitamin  1 tablet Oral QHS   Continuous Infusions:   LOS: 3 days    Time spent: 79min     Domenic Polite, MD [facility] Pager [phone]  If 7PM-7AM, please contact night-coverage [email or web address] Password [email or web address] 11/26/2016, 11:52 AM

## 2016-11-26 NOTE — Evaluation (Signed)
Occupational Therapy Evaluation Patient Details Name: Cameron Mendez MRN: [id number] DOB: 04-Sep-1946 Today's Date: 11/26/2016    History of Present Illness 70 y.o. male who presents with ESRD in fluid overload w/ respiratory distress and COPD exacerbation w/ possible HCAP. Pt in need of urgent dialysis.  Pt. PMH: demenia, confusion, pulmonary edema. 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   Clinical Impression   Pt. Was cooperative with OT and was able to follow directions. Pt. Wife told the hospital staff yesterday that she was interested in placement for pt. Pt. Wife has had been his primary caregiver. Pt. Is now S to Min A with ADLs and mobility. Pt. Has ability to follow directions and has good potential to increase level of ability with self care and mobility.   Acute OT to follow.  Follow Up Recommendations  Home health OT;SNF    Equipment Recommendations   (unknown what pt. has at home.)    Recommendations for Other Services       Precautions / Restrictions Precautions Precautions: Fall Restrictions Weight Bearing Restrictions: No      Mobility Bed Mobility Overal bed mobility: Needs Assistance Bed Mobility: Supine to Sit;Sit to Supine     Supine to sit: Supervision Sit to supine: Supervision      Transfers Overall transfer level: Needs assistance Equipment used: 1 person hand held assist Transfers: Sit to/from [facility] Sit to Stand: Min assist Stand pivot transfers: Min guard            Balance                                            ADL Overall ADL's : Needs assistance/impaired Eating/Feeding: Independent   Grooming: Wash/dry hands;Wash/dry face;Oral care;Minimal assistance;Standing   Upper Body Bathing: Supervision/ safety;Set up;Sitting   Lower Body Bathing: Minimal assistance;Sit to/from stand   Upper Body Dressing : Minimal assistance;Sitting   Lower Body  Dressing: Minimal assistance;Sit to/from stand   Toilet Transfer: Min guard;Minimal assistance;Stand-pivot   Toileting- Water quality scientist and Hygiene: Moderate assistance;Sit to/from stand       Functional mobility during ADLs: Min guard;Minimal assistance General ADL Comments: Pt. has decreased I and safety with ADL and mobiliaty secondary to weakness and confusion.      Vision Vision Assessment?:  (Pt. can only sees shapes and not details.)   Perception     Praxis      Pertinent Vitals/Pain Pain Assessment: No/denies pain     Hand Dominance Right   Extremity/Trunk Assessment Upper Extremity Assessment Upper Extremity Assessment: Overall WFL for tasks assessed           Communication Communication Communication: No difficulties   Cognition Arousal/Alertness: Awake/alert Behavior During Therapy: WFL for tasks assessed/performed Overall Cognitive Status: No family/caregiver present to determine baseline cognitive functioning                 General Comments: Pt. oriented to self only. Pt. was able to follow basic one step direciton.    General Comments       Exercises       Shoulder Instructions      Home Living Family/patient expects to be discharged to:: Skilled nursing facility  Additional Comments: Pt. wife told nursing she can't handle him at home. Hospital is now looking at placement.       Prior Functioning/Environment Level of Independence: Needs assistance        Comments: Pt. wife not availabe to answer question and pt. not able. Pt. would appear to have needed 24 hours S at home.        OT Problem List: Decreased safety awareness;Decreased knowledge of use of DME or AE;Impaired tone   OT Treatment/Interventions: Self-care/ADL training;DME and/or AE instruction;Therapeutic activities;Patient/family education    OT Goals(Current goals can be found in the care plan section) Acute  Rehab OT Goals Patient Stated Goal: none stated OT Goal Formulation: With patient Time For Goal Achievement: 12/10/16 Potential to Achieve Goals: Good ADL Goals Pt Will Perform Grooming: with supervision;standing Pt Will Perform Lower Body Bathing: with supervision;sit to/from stand Pt Will Perform Upper Body Dressing: with supervision;sitting Pt Will Perform Lower Body Dressing: with supervision;sit to/from stand Pt Will Transfer to Toilet: with supervision;ambulating;regular height toilet Pt Will Perform Toileting - Clothing Manipulation and hygiene: with supervision;sit to/from stand Pt Will Perform Tub/Shower Transfer: with supervision;ambulating;grab bars  OT Frequency: Min 2X/week   Barriers to D/C: Decreased caregiver support  Pt. wife states he is getting more difficult to care for and LTC is being consisered.        Co-evaluation              End of Session Equipment Utilized During Treatment: Gait belt  Activity Tolerance: Patient tolerated treatment well Patient left: in bed;with call bell/phone within reach;with bed alarm set   Time: [id number] OT Time Calculation (min): 46 min Charges:    G-Codes:    Cameron Mendez 12/22/2016, 11:12 AM

## 2016-11-27 LAB — RENAL FUNCTION PANEL
Albumin: 3.2 g/dL — ABNORMAL LOW (ref 3.5–5.0)
Anion gap: 16 — ABNORMAL HIGH (ref 5–15)
BUN: 113 mg/dL — ABNORMAL HIGH (ref 6–20)
CO2: 22 mmol/L (ref 22–32)
Calcium: 8 mg/dL — ABNORMAL LOW (ref 8.9–10.3)
Chloride: 93 mmol/L — ABNORMAL LOW (ref 101–111)
Creatinine, Ser: 8.72 mg/dL — ABNORMAL HIGH (ref 0.61–1.24)
GFR calc Af Amer: 6 mL/min — ABNORMAL LOW (ref >60)
GFR calc non Af Amer: 5 mL/min — ABNORMAL LOW (ref >60)
Glucose, Bld: 95 mg/dL (ref 65–99)
Phosphorus: 7.5 mg/dL — ABNORMAL HIGH (ref 2.5–4.6)
Potassium: 4.9 mmol/L (ref 3.5–5.1)
Sodium: 131 mmol/L — ABNORMAL LOW (ref 135–145)

## 2016-11-27 LAB — CBC
HCT: 29.6 % — ABNORMAL LOW (ref 39.0–52.0)
Hemoglobin: 9.7 g/dL — ABNORMAL LOW (ref 13.0–17.0)
MCH: 29.8 pg (ref 26.0–34.0)
MCHC: 32.8 g/dL (ref 30.0–36.0)
MCV: 90.8 fL (ref 78.0–100.0)
Platelets: 77 10*3/uL — ABNORMAL LOW (ref 150–400)
RBC: 3.26 MIL/uL — ABNORMAL LOW (ref 4.22–5.81)
RDW: 17.6 % — ABNORMAL HIGH (ref 11.5–15.5)
WBC: 6.1 10*3/uL (ref 4.0–10.5)

## 2016-11-27 LAB — PROCALCITONIN: PROCALCITONIN: 1.44 ng/mL

## 2016-11-27 MED ORDER — LIDOCAINE HCL (PF) 1 % IJ SOLN
5.0000 mL | INTRAMUSCULAR | Status: DC | PRN
Start: 2016-11-27 — End: 2016-11-27

## 2016-11-27 MED ORDER — PENTAFLUOROPROP-TETRAFLUOROETH EX AERO: 1.0000 "application " | INHALATION_SPRAY | CUTANEOUS | Status: DC | PRN

## 2016-11-27 MED ORDER — SODIUM CHLORIDE 0.9 % IV SOLN
100.0000 mL | INTRAVENOUS | Status: DC | PRN
Start: 2016-11-27 — End: 2016-11-27

## 2016-11-27 MED ORDER — HEPARIN SODIUM (PORCINE) 1000 UNIT/ML DIALYSIS: 3000.0000 [IU] | Freq: Once | INTRAMUSCULAR | Status: DC

## 2016-11-27 MED ORDER — LIDOCAINE-PRILOCAINE 2.5-2.5 % EX CREA: 1.0000 "application " | TOPICAL_CREAM | CUTANEOUS | Status: DC | PRN

## 2016-11-27 MED ORDER — ALTEPLASE 2 MG IJ SOLR: 2.0000 mg | Freq: Once | INTRAMUSCULAR | Status: DC | PRN

## 2016-11-27 MED ORDER — HEPARIN SODIUM (PORCINE) 1000 UNIT/ML DIALYSIS: 1000.0000 [IU] | INTRAMUSCULAR | Status: DC | PRN

## 2016-11-27 MED ORDER — ALTEPLASE 2 MG IJ SOLR
2.0000 mg | Freq: Once | INTRAMUSCULAR | Status: DC | PRN
Start: 2016-11-27 — End: 2016-11-27

## 2016-11-27 MED ORDER — DARBEPOETIN ALFA 200 MCG/0.4ML IJ SOSY
PREFILLED_SYRINGE | INTRAMUSCULAR | Status: AC
  Filled 2016-11-27: qty 0.4

## 2016-11-27 MED ORDER — LIDOCAINE HCL (PF) 1 % IJ SOLN: 5.0000 mL | INTRAMUSCULAR | Status: DC | PRN

## 2016-11-27 MED ORDER — HYDROCOD POLST-CPM POLST ER 10-8 MG/5ML PO SUER
5.0000 mL | Freq: Once | ORAL | Status: AC
  Administered 2016-11-27: 5 mL via ORAL
  Filled 2016-11-27: qty 5

## 2016-11-27 MED ORDER — SODIUM CHLORIDE 0.9 % IV SOLN: 100.0000 mL | INTRAVENOUS | Status: DC | PRN

## 2016-11-27 NOTE — Discharge Summary (Addendum)
Physician Discharge Summary  Cameron Mendez [id number] DOB: [birth year] DOA: 11/23/2016  PCP: [name], MD  Admit date: 11/23/2016 Discharge date: 11/27/2016  Time spent: 45 minutes  Recommendations for Outpatient Follow-up:  1. PCP in 1week 2. SNF for Rehabilitation  Discharge Diagnoses:  Principal Problem:   Acute resp failure   Agitation/delirium   Dementia   BLINDNESS, LEGAL, [place] DEFINITION   Essential hypertension, benign   Anemia   Thrombocytopenia (HCC)   ESRD on hemodialysis (HCC)   Volume overload   COPD (chronic obstructive pulmonary disease) (HCC)   Atrial fibrillation, currently in sinus rhythm ([place])    Severe malnutrition   Discharge Condition: stable  Diet recommendation: Renal  Filed Weights   11/25/16 2105 11/26/16 2133 11/27/16 0758  Weight: 79 kg (174 lb 2.6 oz) 63.6 kg (140 lb 4.8 oz) 63.7 kg (140 lb 6.9 oz)    History of present illness:  Cameron L Smithis a 70 y.o.malewith medical history significant for atrial fibrillation on anticoagulation, chronic kidney disease on dialysis, hypertension, anemia, chronic thrombocytopenia, legally blind and COPD in setting of ongoing tobacco abuse. Patient has been "sick" with cough and other upper respiratory symptoms without definitive fever for about 2 weeks. Because of the symptoms he felt weak and unable to attend dialysis on 12/4 and 12/13. 12/13 he became more confused and has developed poor oral intake. Developed cough, dyspnea on exertion and orthopnea.  Hospital Course:    Acute hypoxic resp failure -due to volume overload primarily, missed HD x2 -possibly a component of URI/pneumonitis too -improved s/p extra HD   -repeat CXR without pneumonia hence all abx stopped -FLu and resp viral panel negative -improved and stable, wife states that she is unable to care for him at home due to dementia hence needs SNF   Dementia and Blindness -has confusion at baseline per staff at his Dialysis  center -evidence of sundowning and delirium noted in hospital in the evenings -wife unable to care for pt, SNF recommended per PT/OT  COPD (chronic obstructive pulmonary disease) -stable, no wheezing, nebs PRN  Essential hypertension, benign -Uncontrolled due to volume overload or missed dialysis -Continue carvedilol and Cardizem  Atrial fibrillation, currently in sinus rhythm  -Continues to maintain sinus rhythm and was not on anticoagulation prior to admission per Cards notes -chadsvasc score >3  BLINDNESS, LEGAL -Needs assistance  Anemia of chronic disease -transfused 1 unit PRBC -hb improved and stable  Thrombocytopenia  -Chronic in nature, stable and greater than 100,000 -down to 85K, stopped heparin   Severe malnutrition -supplements advised  Consultations:  Renal  Discharge Exam: Vitals:   11/27/16 1000 11/27/16 1030  BP: (!) 159/51 (!) 160/71  Pulse: 70 70  Resp: 15 15  Temp:      General: AAOx3 Cardiovascular: S1S2/RRR Respiratory: CTAB  Discharge Instructions   Discharge Instructions    Discharge instructions    Complete by:  As directed    Renal Diet   Increase activity slowly    Complete by:  As directed      Current Discharge Medication List    CONTINUE these medications which have NOT CHANGED   Details  allopurinol (ZYLOPRIM) 100 MG tablet Take 100 mg by mouth daily.     aspirin 81 MG tablet Take 81 mg by mouth daily.    carvedilol (COREG) 12.5 MG tablet Take 1 tablet (12.5 mg total) by mouth 2 (two) times daily with a meal. Qty: 60 tablet, Refills: 0    diltiazem (CARDIZEM  CD) 120 MG 24 hr capsule Take 1 capsule (120 mg total) by mouth daily. Qty: 30 capsule, Refills: 1    doxercalciferol (HECTOROL) 4 MCG/2ML injection Inject 0.5 mLs (1 mcg total) into the vein every Monday, Wednesday, and Friday with hemodialysis. Qty: 2 mL    ferric gluconate 125 mg in sodium chloride 0.9 % 100 mL Inject 125 mg into the vein  every Monday, Wednesday, and Friday with hemodialysis.    SPIRIVA RESPIMAT 2.5 MCG/ACT AERS INHALE 2 PUFFS INTO THE LUNGS EVERY MORNING Qty: 4 Inhaler, Refills: 0      STOP taking these medications     guaiFENesin (MUCINEX) 600 MG 12 hr tablet      levofloxacin (LEVAQUIN) 750 MG tablet        Allergies  Allergen Reactions  . Ibuprofen Other (See Comments)    Kidney problems-dialysis patient  . Nsaids Other (See Comments)    Kidney problems-dialysis patient   Follow-up Information    [name], MD. Schedule an appointment as soon as possible for a visit in 1 week(s).   Specialty:  Family Medicine Contact information: [address] [place] [address] [phone]            The results of significant diagnostics from this hospitalization (including imaging, microbiology, ancillary and laboratory) are listed below for reference.    Significant Diagnostic Studies: Dg Chest 2 View  Result Date: 11/24/2016 CLINICAL DATA:  Two weeks of cough and other rep sperm tori symptoms. No definite fever. EXAM: CHEST  2 VIEW COMPARISON:  Portable chest x-ray of November 23, 2016 FINDINGS: The lungs are borderline hypoinflated. The interstitial markings are increased. The pulmonary vascularity is engorged. The cardiac silhouette is enlarged. There is a small right pleural effusion layering posteriorly. There is calcification in the wall of the thoracic aorta. A ventriculoperitoneal shunt tube is present. The observed bony thorax exhibits no acute abnormality. IMPRESSION: CHF with mild pulmonary interstitial edema. Small right pleural effusion. No definite pneumonia. Thoracic aortic atherosclerosis. Electronically Signed   By: David  Martinique M.D.   On: 11/24/2016 13:40   Dg Chest Port 1 View  Result Date: 11/23/2016 CLINICAL DATA:  Dyspnea, CHF, hypertension, stage IV chronic kidney disease EXAM: PORTABLE CHEST 1 VIEW COMPARISON:  Portable exam 1219 hours compared to 11/23/2016  at 0604 hours FINDINGS: VP shunt tubing traverses RIGHT hemithorax. Enlargement of cardiac silhouette with pulmonary vascular congestion. Stable mediastinal contours. Atherosclerotic calcification aorta. Small RIGHT pleural effusion with questionable tiny LEFT pleural effusion. Mild RIGHT basilar opacity, improved from earlier study, question atelectasis or slightly improved edema. Remaining lungs clear. No pneumothorax. Bones demineralized. IMPRESSION: Enlargement of cardiac silhouette with pulmonary vascular congestion. Small RIGHT pleural effusion with slightly improved aeration since the earlier study question improved atelectasis versus edema. No Electronically Signed   By: Lavonia Dana M.D.   On: 11/23/2016 12:27   Dg Chest Portable 1 View  Result Date: 11/23/2016 CLINICAL DATA:  70 year old male with shortness of breath and cough. EXAM: PORTABLE CHEST 1 VIEW COMPARISON:  Chest radiograph dated 11/21/2015 FINDINGS: There is stable cardiomegaly. Diffuse interstitial prominence and nodularity may represent mild edema. Right lung base hazy density may represent atelectasis versus infiltrate. A small right pleural effusion noted. There is no pneumothorax. A shunt catheter is noted along the right hemithorax. No acute osseous pathology identified. IMPRESSION: Small right pleural effusion with right lung base atelectasis versus infiltrate. Stable cardiomegaly with possible mild interstitial edema. Electronically Signed   By:  Anner Crete M.D.   On: 11/23/2016 06:18    Microbiology: Recent Results (from the past 240 hour(s))  Blood culture (routine x 2)     Status: None (Preliminary result)   Collection Time: 11/23/16  6:43 AM  Result Value Ref Range Status   Specimen Description BLOOD RIGHT ANTECUBITAL  Final   Special Requests BOTTLES DRAWN AEROBIC AND ANAEROBIC  5CC  Final   Culture NO GROWTH 3 DAYS  Final   Report Status PENDING  Incomplete  Blood culture (routine x 2)     Status: None  (Preliminary result)   Collection Time: 11/23/16  7:30 AM  Result Value Ref Range Status   Specimen Description BLOOD RIGHT HAND  Final   Special Requests BOTTLES DRAWN AEROBIC ONLY  10CC  Final   Culture NO GROWTH 3 DAYS  Final   Report Status PENDING  Incomplete  MRSA PCR Screening     Status: None   Collection Time: 11/23/16  9:47 AM  Result Value Ref Range Status   MRSA by PCR NEGATIVE NEGATIVE Final    Comment:        The GeneXpert MRSA Assay (FDA approved for NASAL specimens only), is one component of a comprehensive MRSA colonization surveillance program. It is not intended to diagnose MRSA infection nor to guide or monitor treatment for MRSA infections.   Respiratory Panel by PCR     Status: None   Collection Time: 11/23/16  9:47 AM  Result Value Ref Range Status   Adenovirus NOT DETECTED NOT DETECTED Final   Coronavirus 229E NOT DETECTED NOT DETECTED Final   Coronavirus HKU1 NOT DETECTED NOT DETECTED Final   Coronavirus NL63 NOT DETECTED NOT DETECTED Final   Coronavirus OC43 NOT DETECTED NOT DETECTED Final   Metapneumovirus NOT DETECTED NOT DETECTED Final   Rhinovirus / Enterovirus NOT DETECTED NOT DETECTED Final   Influenza A NOT DETECTED NOT DETECTED Final   Influenza B NOT DETECTED NOT DETECTED Final   Parainfluenza Virus 1 NOT DETECTED NOT DETECTED Final   Parainfluenza Virus 2 NOT DETECTED NOT DETECTED Final   Parainfluenza Virus 3 NOT DETECTED NOT DETECTED Final   Parainfluenza Virus 4 NOT DETECTED NOT DETECTED Final   Respiratory Syncytial Virus NOT DETECTED NOT DETECTED Final   Bordetella pertussis NOT DETECTED NOT DETECTED Final   Chlamydophila pneumoniae NOT DETECTED NOT DETECTED Final   Mycoplasma pneumoniae NOT DETECTED NOT DETECTED Final     Labs: Basic Metabolic Panel:  Recent Labs Lab 11/23/16 0610 11/23/16 0618 11/24/16 0541 11/25/16 0427 11/26/16 0341 11/27/16 0700  NA 139 139 135 134* 133* 131*  K 5.5* 5.5* 3.8 4.7 4.9 4.9  CL 107  110 96* 96* 94* 93*  CO2 17*  --  24 25 24 22   GLUCOSE 88 85 110* 104* 108* 95  BUN 61* 63* 35* 38* 78* 113*  CREATININE 9.66* 10.20* 5.27* 4.51* 6.54* 8.72*  CALCIUM 8.9  --  8.7* 9.0 8.9 8.0*  PHOS  --   --  7.3*  --   --  7.5*   Liver Function Tests:  Recent Labs Lab 11/23/16 0610 11/24/16 0541 11/27/16 0700  AST 24  --   --   ALT 18  --   --   ALKPHOS 51  --   --   BILITOT 0.7  --   --   PROT 7.3  --   --   ALBUMIN 3.3* 3.0* 3.2*   No results for input(s): LIPASE, AMYLASE in the last  168 hours. No results for input(s): AMMONIA in the last 168 hours. CBC:  Recent Labs Lab 11/23/16 0610 11/23/16 0618 11/24/16 0541 11/25/16 0428 11/26/16 0341 11/27/16 0901  WBC 7.7  --  5.3 9.6 9.6 6.1  NEUTROABS 6.4  --   --   --   --   --   HGB 7.9* 8.5* 7.6* 9.5* 9.7* 9.7*  HCT 25.5* 25.0* 24.4* 30.4* 31.2* 29.6*  MCV 95.1  --  93.8 94.4 92.3 90.8  PLT 113*  --  85* 89* 85* 77*   Cardiac Enzymes: No results for input(s): CKTOTAL, CKMB, CKMBINDEX, TROPONINI in the last 168 hours. BNP: BNP (last 3 results) No results for input(s): BNP in the last 8760 hours.  ProBNP (last 3 results) No results for input(s): PROBNP in the last 8760 hours.  CBG:  Recent Labs Lab 11/25/16 2231 11/26/16 0919  GLUCAP 127* 106*       SignedDomenic Polite MD.  [facility] 11/27/2016, 12:15 PM

## 2016-11-27 NOTE — NC FL2 (Signed)
[place] MEDICAID FL2 LEVEL OF CARE SCREENING TOOL     IDENTIFICATION  Patient Name: Cameron Mendez Birthdate: 1946/06/02 Sex: male Admission Date (Current Location): 11/23/2016  [facility] and [place] Number:  Herbalist and Address:  [facility] [place], [place] [address], [place], [place] [address]      Provider Number: [id number]  Attending Physician Name and Address:  Domenic Polite, MD  Relative Name and Phone Number:  Umair, [place]; [phone] (h), [phone] (mobile)     Current Level of Care: Hospital Recommended Level of Care: [place] Prior Approval Number:    Date Approved/Denied:   PASRR Number: [id number] (Eff. 11/27/16)  Discharge Plan: SNF    Current Diagnoses: Patient Active Problem List   Diagnosis Date Noted  . Acute dyspnea 11/23/2016  . Cough 11/23/2016  . Respiratory distress 11/23/2016  . Symptomatic anemia 11/18/2015  . Acute coronary syndrome ([place]) 09/28/2015  . HCAP (healthcare-associated pneumonia) 09/18/2015  . Cardiac arrest ([place]) 09/13/2015  . Atrial fibrillation, currently in sinus rhythm ([place]) 03/03/2015  . Tobacco abuse 01/10/2015  . Pleural effusion 01/09/2015  . Chronic diastolic CHF (congestive heart failure) ([place]) 01/08/2015  . History of adenomatous polyp of colon 12/31/2014  . suspected COPD with acute exacerbation 09/10/2014  . Other pancytopenia ([place]) 08/21/2014  . COPD (chronic obstructive pulmonary disease) ([place]) 05/12/2014  . COPD exacerbation ([place]) 04/29/2014  . History of pericardiocentesis 04/27/2014  . Volume overload 04/27/2014  . Anemia 09/18/2013  . Thrombocytopenia ([place]) 09/18/2013  . ESRD on hemodialysis ([place]) 09/18/2013  . Essential hypertension, benign 10/04/2009  . BLINDNESS, [place], [place] DEFINITION 10/01/2009  . OSTEOARTHRITIS 10/01/2009  . Gout, unspecified 09/28/2009    Orientation RESPIRATION BLADDER Height & Weight     Self  Normal Continent  Weight: 137 lb 12.6 oz (62.5 kg) Height:  5\' 3"  (160 cm)  BEHAVIORAL SYMPTOMS/MOOD NEUROLOGICAL BOWEL NUTRITION STATUS      Continent Diet (Renal with fluid restrictions, 1200 mL)  AMBULATORY STATUS COMMUNICATION OF NEEDS Skin   Limited Assist Verbally Normal (Amputation left small finger)                       Personal Care Assistance Level of Assistance  Bathing, Feeding, Dressing Bathing Assistance: Limited assistance (Upper body-supervision and lower body min assist) Feeding assistance: Independent Dressing Assistance: Limited assistance     Functional Limitations Info  Sight, Hearing, Speech Sight Info: Impaired (Legally blind) Hearing Info: Adequate Speech Info: Adequate    SPECIAL CARE FACTORS FREQUENCY  PT (By licensed PT), OT (By licensed OT)     PT Frequency: Evaluated 12/17 and a minimum of 2X per week therapy recommended OT Frequency: Evaluated 12/17 and a minimum of 2X per week therapy recommended            Contractures Contractures Info: Not present    Additional Factors Info  Code Status, Allergies Code Status Info: Full Allergies Info: Ibuprofen, Nsaids           Current Medications (11/27/2016):  This is the current hospital active medication list Current Facility-Administered Medications  Medication Dose Route Frequency Provider Last Rate Last Dose  . 0.9 %  sodium chloride infusion   Intravenous Once Domenic Polite, MD      . acetaminophen (TYLENOL) tablet 650 mg  650 mg Oral Q6H PRN Samella Parr, NP   650 mg at 11/26/16 2213   Or  . acetaminophen (TYLENOL) suppository 650 mg  650 mg Rectal Q6H PRN Samella Parr, NP      . allopurinol (ZYLOPRIM) tablet 100 mg  100 mg Oral Daily Samella Parr, NP   100 mg at 11/27/16 1348  . aspirin EC tablet 81 mg  81 mg Oral Daily Samella Parr, NP   81 mg at 11/27/16 1349  . carvedilol (COREG) tablet 12.5 mg  12.5 mg Oral BID WC Tatyana Kirichenko, PA-C   12.5 mg at 11/26/16 1754  . Darbepoetin  Alfa (ARANESP) injection 200 mcg  200 mcg Intravenous Q Mon-HD Valentina Gu, NP   200 mcg at 11/27/16 0917  . diltiazem (CARDIZEM CD) 24 hr capsule 120 mg  120 mg Oral Daily Tatyana Kirichenko, PA-C   120 mg at 11/27/16 1349  . feeding supplement (NEPRO CARB STEADY) liquid 237 mL  237 mL Oral BID BM Valentina Gu, NP   237 mL at 11/27/16 1400  . ipratropium-albuterol (DUONEB) 0.5-2.5 (3) MG/3ML nebulizer solution 3 mL  3 mL Nebulization Q4H PRN Domenic Polite, MD      . multivitamin (RENA-VIT) tablet 1 tablet  1 tablet Oral QHS Valentina Gu, NP   1 tablet at 11/26/16 2213  . ondansetron (ZOFRAN) tablet 4 mg  4 mg Oral Q6H PRN Samella Parr, NP       Or  . ondansetron [facility]) injection 4 mg  4 mg Intravenous Q6H PRN Samella Parr, NP         Discharge Medications: Please see discharge summary for a list of discharge medications.  Relevant Imaging Results:  Relevant Lab Results:   Additional Information [phone].  [facility].  Sable Feil, LCSW

## 2016-11-27 NOTE — Plan of Care (Signed)
Problem: Safety: Goal: Ability to remain free from injury will improve Outcome: Progressing Patient remains free of fall and injuries. Bed alarm is on and call bell within reach. Patient was educated to call for help when in need. Will continue to assess  Problem: Pain Managment: Goal: General experience of comfort will improve Outcome: Progressing Patient does not complain of any discomfort and appears to be resting well.  Problem: Skin Integrity: Goal: Risk for impaired skin integrity will decrease Outcome: Progressing Patient is mobile and able to turn self in bed. No signs of skin breakdown

## 2016-11-27 NOTE — Procedures (Signed)
Tol HD, BFR 400 via LUE AV access, no hemodynamic instability. Jarry Manon C

## 2016-11-27 NOTE — Clinical Social Work Note (Signed)
Assessment completed (full assessment to follow), bed search initiated and patient will discharge to [facility] on 12/19.  Delmo Matty Givens, MSW, LCSW Licensed Clinical Social Worker [place] [phone]

## 2016-11-28 LAB — CULTURE, BLOOD (ROUTINE X 2)
CULTURE: NO GROWTH
CULTURE: NO GROWTH

## 2016-11-28 NOTE — Care Management Important Message (Signed)
Important Message  Patient Details  Name: Cameron Mendez MRN: [id number] Date of Birth: [birth year]   Medicare Important Message Given:  Yes    Michale Weikel [address] 11/28/2016, 11:39 AM

## 2016-11-28 NOTE — Progress Notes (Signed)
Report given to [facility]. In [place]

## 2016-11-28 NOTE — Clinical Social Work Note (Signed)
Clinical Social Work Assessment  Patient Details  Name: Cameron Mendez MRN: [id number] Date of Birth: 1946-01-27  Date of referral:  11/27/16               Reason for consult:  Facility Placement                Permission sought to share information with:  Family Supports Permission granted to share information::  No (Patient oriented to self only)  Name::     Cameron Mendez  Agency::     Relationship::  Wife  Contact Information:  [id number]  Housing/Transportation Living arrangements for the past 2 months:  Single Family Home Source of Information:  Spouse Patient Interpreter Needed:  None Criminal Activity/Legal Involvement Pertinent to Current Situation/Hospitalization:  No - Comment as needed Significant Relationships:  Spouse Lives with:  Spouse Do you feel safe going back to the place where you live?  No (Wife in agreement that patient needs rehab as she cannot care for him at this time) Need for family participation in patient care:  Yes (Comment)  Care giving concerns:  Wife in agreement with ST rehab, indicating that patient needs rehab before coming home.  Social Worker assessment / plan: CSW talked with patient's wife on 11/27/16 at bedside regarding discharge plan and recommendation of ST rehab. Cameron Mendez reports that she and patient live together and no one else is in the home. Patient was lying in bed awake, but did not participate in the conversation. CSW talked with Cameron Mendez regarding the facility search process and her preference is [facility].  Employment status:  Retired Forensic scientist:  Advice worker) PT Recommendations:  [place] / Referral to community resources:  [place] (Wife provided with SNF list for [facility])  Patient/Family's Response to care: Cameron Mendez expressed no concerns regarding patient's care during hospitalization.  Patient/Family's Understanding of  and Emotional Response to Diagnosis, Current Treatment, and Prognosis:  Not discussed.  Emotional Assessment Appearance:  Appears stated age Attitude/Demeanor/Rapport:  Unable to Assess, Other (Patient quiet during CSW visit) Affect (typically observed):  Quiet Orientation:  Oriented to Self Alcohol / Substance use:  Tobacco Use, Alcohol Use, Illicit Drugs (Patient reported that he quit smoking and drinking and does not use illict drugs) Psych involvement (Current and /or in the community):  No (Comment)  Discharge Needs  Concerns to be addressed:  Discharge Planning Concerns Readmission within the last 30 days:  No Current discharge risk:  None Barriers to Discharge:  No Barriers Identified   Sable Feil, LCSW 11/28/2016, 5:45 PM

## 2016-11-28 NOTE — Progress Notes (Signed)
Pt seen and examined, no changes from DC summary 12/18 Plan for SNF today  Domenic Polite, MD

## 2016-11-28 NOTE — Clinical Social Work Placement (Addendum)
   CLINICAL SOCIAL WORK PLACEMENT  NOTE  Date:  11/28/2016  Patient Details  Name: Cameron Mendez MRN: [id number] Date of Birth: [birth year]  Clinical Social Work is seeking post-discharge placement for this patient at the [place] level of care (*CSW will initial, date and re-position this form in  chart as items are completed):  Yes   Patient/family provided with [place] Work Department's list of facilities offering this level of care within the geographic area requested by the patient (or if unable, by the patient's family).  Yes   Patient/family informed of their freedom to choose among providers that offer the needed level of care, that participate in Medicare, Medicaid or managed care program needed by the patient, have an available bed and are willing to accept the patient.  Yes   Patient/family informed of [facility] ownership interest in [facility], as well as of the fact that they are under no obligation to receive care at these facilities.  PASRR submitted to EDS on 11/27/16     PASRR number received on 11/27/16     Existing PASRR number confirmed on       FL2 transmitted to all facilities in geographic area requested by pt/family on 11/27/16     FL2 transmitted to all facilities within larger geographic area on 11/27/16     Patient informed that his/her managed care company has contracts with or will negotiate with certain facilities, including the following:        Yes   Patient/family informed of bed offers received.  Patient chooses bed at [place] recommends and patient chooses bed at      Patient to be transferred to [facility] on 11/28/16.  Patient to be transferred to facility by Ambulance     Patient family notified on 11/28/16 of transfer.  Name of family member notified:  Wife Jakolby Hosek by phone.     PHYSICIAN      Additional Comment:   11/27/16 - Received insurance authorization from [place] with [employer]. Auth [id number] effective 11/28/16. 12/21 next review date and rug level RVB.   _______________________________________________ Sable Feil, LCSW 11/28/2016, 5:50 PM

## 2016-11-30 ENCOUNTER — Non-Acute Institutional Stay (SKILLED_NURSING_FACILITY): Payer: Medicare Other | Admitting: Internal Medicine

## 2016-11-30 ENCOUNTER — Encounter: Payer: Self-pay | Admitting: Internal Medicine

## 2016-11-30 DIAGNOSIS — D696 Thrombocytopenia, unspecified: Secondary | ICD-10-CM | POA: Diagnosis not present

## 2016-11-30 DIAGNOSIS — I5032 Chronic diastolic (congestive) heart failure: Secondary | ICD-10-CM | POA: Diagnosis not present

## 2016-11-30 DIAGNOSIS — I10 Essential (primary) hypertension: Secondary | ICD-10-CM | POA: Diagnosis not present

## 2016-11-30 DIAGNOSIS — D649 Anemia, unspecified: Secondary | ICD-10-CM | POA: Diagnosis not present

## 2016-11-30 DIAGNOSIS — Z992 Dependence on renal dialysis: Secondary | ICD-10-CM

## 2016-11-30 DIAGNOSIS — N186 End stage renal disease: Secondary | ICD-10-CM

## 2016-11-30 NOTE — Assessment & Plan Note (Signed)
Anemia is normochromic, normocytic. No evidence of active bleeding dyscrasia despite mild  thrombocytopenia

## 2016-11-30 NOTE — Assessment & Plan Note (Signed)
[id number] systolic blood pressure is mildly elevated. The pressure will be monitored. The beta blocker and calcium channel blocker will not be increased unless sustained hypertension is present because of bradycardia. HCTZ is not an option as he has a past history of gout and end-stage renal disease.

## 2016-11-30 NOTE — Assessment & Plan Note (Signed)
Continue hemodialysis as per dialysis facility protocol

## 2016-11-30 NOTE — Patient Instructions (Signed)
See Current Assessment & Plan in Problem List under specific Diagnosis 

## 2016-11-30 NOTE — Assessment & Plan Note (Signed)
11/30/16 clinical evidence of decompensation at this time. Rhythm is regular with no edema present

## 2016-11-30 NOTE — Progress Notes (Signed)
Facility Location: [facility]  Room Number: [id number]  Code Status:   PCP: [name], MD [address] [place] [address]  This is a comprehensive admission note to [facility] performed on this date less than 30 days from date of admission. Included are preadmission medical/surgical history;reconciled medication list; family history; social history and comprehensive review of systems.  Corrections and additions to the records were documented . Comprehensive physical exam was also performed. Additionally a clinical summary was entered for each active diagnosis pertinent to this admission in the Problem List to enhance continuity of care.   HPI: The patient was hospitalized 12/14-12/18/2017 with acute respiratory failure associated with agitation and delirium in the context of baseline dementia. Prior to admission the patient had been "sick" with cough & associated weakness. Due this illness he failed to attend dialysis on 12/4 and 12/13. On 12/13 he was noted to be more confused and was not eating. He was documented to have  acute hypoxic respiratory failure due to volume overload having missed hemodialysis 2. There was felt to be a possible component of upper respiratory tract infection also.  Antibiotic therapy was stopped when no pneumonia was documented on chest x-ray. He improved significantly with restarting hemodialysis. Patient has baseline dementia with associated sundowning and delirium ; his wife was no longer able to care for him and requested SNF placement. Hypertension was poorly controlled due to the volume overload ,ARB and carvedilol were continued. He had a history of atrial fibrillation but was in sinus rhythm.He had not been taking anticoagulants prior to admission. His chads 2 score is greater than 3. He has anemia of chronic disease. He was transfused with 1 unit of packed red cells. He has chronic thrombocytopenia. Heparin  was discontinued when the platelet count dropped to 85,000. Supplements were initiated for severe malnutrition. His creatinine on 12/18 was 8.72 with a GFR of 6. Potassium was high normal at 4.9. Discharge hematocrit was 29.6 platelet count was 77,000.  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 He has had a cardiac arrest. Surgeries include pericardiocentesis, AV fistula placement for dialysis, and amputation of a gangrenous digit  Social history: He has been counseled but has continued to smoke half a pack per day.  Family history: Reviewed  Review of systems: [place]acity impacted by some dementia. Date given as December 28 "or close", "2000 something". Given the correct date  he was able to recall it slowly 10 minutes later. He did identify the president. He denies any active cardiopulmonary symptoms at this time.  He states he knows he snores.  Constitutional: No fever,significant weight change, fatigue  Eyes: No redness, discharge, pain, vision change ENT/mouth: No nasal congestion,  purulent discharge, earache,change in hearing ,sore throat  Cardiovascular: No chest pain, palpitations,paroxysmal nocturnal dyspnea, claudication, edema  Respiratory: No cough, sputum production,hemoptysis, DOE , apnea   Gastrointestinal: No heartburn,dysphagia,abdominal pain, nausea / vomiting,rectal bleeding, melena,change in bowels Genitourinary: No dysuria,hematuria, pyuria,  incontinence, nocturia Musculoskeletal: No joint stiffness, joint swelling, weakness,pain Dermatologic: No rash, pruritus, change in appearance of skin Neurologic: No dizziness,headache,syncope, seizures, numbness , tingling Psychiatric: No significant anxiety , depression, insomnia, anorexia Endocrine: No change in hair/skin/ nails, excessive thirst, excessive hunger, excessive urination  Hematologic/lymphatic: No  significant bruising, lymphadenopathy,abnormal bleeding Allergy/immunology: No itchy/ watery eyes, significant sneezing, urticaria, angioedema  Physical exam:  Pertinent or positive findings:Pattern  alopecia is present. He has resting exotropia on the right with superior deviation of the eye. Small pterygium is noted on the left. He is blind. He has complete dentures.  There is a thrombosed vein in the right posterior neck. S4 with a very short, brisk systolic murmur at the base.  He has decreased breath sounds in the right lower lobe with rales.(Note: Chest x-ray 11/24/16 revealed congestive heart failure with mild pulmonary essential edema. There was a small right pleural effusion noted. No pneumonia present.) There are minimal rales in the left lower lobe. He exhibits nonproductive cough intermittently. He has multiple well-healed operative scars on the abdomen. Pedal pulses are decreased. Biceps reflexes are 1+, knee reflexes are 1.5+ and brisk.  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  General appearance:Adequately nourished; no acute distress , increased work of breathing is present.   Lymphatic: No lymphadenopathy about the head, neck, axilla . Eyes: No conjunctival inflammation or lid edema is present. There is no scleral icterus. Ears:  External ear exam shows no significant lesions or deformities.   Nose:  External nasal examination shows no deformity or inflammation. Nasal mucosa are pink and moist without lesions ,exudates Oral exam: lips and gums are healthy appearing.There is no oropharyngeal erythema or exudate . Neck:  No thyromegaly, masses, tenderness noted.    Heart:  Normal rate and regular rhythm.  S2 normal without gallop,click, rub .  Abdomen:Bowel sounds are normal. Abdomen is soft and nontender with no organomegaly, hernias,masses. GU: deferred . Extremities:  No cyanosis, edema  Neurologic exam : Strength equal  in upper & lower extremities excellent Skin:  Warm & dry w/o tenting. No significant lesions or rash.  See clinical summary under each active problem in the Problem List with associated updated therapeutic plan

## 2016-11-30 NOTE — Assessment & Plan Note (Signed)
Avoid anticoagulation except for 81 mg of aspirin daily. Repeat platelet count with any active bleeding dyscrasia

## 2016-12-08 ENCOUNTER — Encounter: Payer: Self-pay | Admitting: Nurse Practitioner

## 2016-12-08 ENCOUNTER — Non-Acute Institutional Stay (SKILLED_NURSING_FACILITY): Payer: Medicare Other | Admitting: Nurse Practitioner

## 2016-12-08 DIAGNOSIS — I5032 Chronic diastolic (congestive) heart failure: Secondary | ICD-10-CM

## 2016-12-08 DIAGNOSIS — R05 Cough: Secondary | ICD-10-CM

## 2016-12-08 DIAGNOSIS — N186 End stage renal disease: Secondary | ICD-10-CM

## 2016-12-08 DIAGNOSIS — Z992 Dependence on renal dialysis: Secondary | ICD-10-CM

## 2016-12-08 DIAGNOSIS — R059 Cough, unspecified: Secondary | ICD-10-CM

## 2016-12-08 NOTE — Progress Notes (Signed)
Nursing Home Location:    [facility]   Code Status: Full Code   Place of Service: SNF (31)  PCP: [name], MD  Allergies  Allergen Reactions  . Ibuprofen Other (See Comments)    Kidney problems-dialysis patient  . Nsaids Other (See Comments)    Kidney problems-dialysis patient    Chief Complaint  Patient presents with  . Acute Visit    Cough and lethargic     HPI:  Patient is a 70 y.o. male seen today at [facility] due to cough and being lethargic. Pt reports feeling poorly for several days with cough. Sitting in wheelchair asleep at time of visit but easily aroused. Had hacking cough. Not productive. Pt with hx of dementia, ESRD on HD, HTN, anemia, CHF Pt reports he is cold as he is sitting with his coat on. No fever noted Staff reports pt has been more lethargic and feeling poorly for several days.  Pt long term resident after hospitalized 12/14-12/18/2017 with acute respiratory failure associated with agitation and delirium in the context of baseline dementia. Prior to admission the patient had been "sick" with cough & associated weakness and due this illness he failed to attend dialysis on 12/4 and 12/13. He was noted to have acute hypoxic respiratory failure due to volume overload having missed hemodialysis 2 with upper respiratory tract infection also.  Review of Systems: limited by dementia and illness  Review of Systems  Constitutional: Positive for activity change, appetite change, chills and fatigue. Negative for diaphoresis and fever.  HENT: Positive for congestion. Negative for postnasal drip, sinus pain, sore throat and voice change.   Respiratory: Positive for cough and wheezing. Negative for shortness of breath.   Cardiovascular: Negative for chest pain and leg swelling.  Neurological: Positive for weakness.    Past Medical History:  Diagnosis Date  . Anemia   . Arthritis    Gout  . Blind   . BPH (benign prostatic  hyperplasia)   . Cardiac arrest ([place])   . CHF (congestive heart failure) ([place])   . CKD (chronic kidney disease), stage IV ([place])    a. L upper extremity AV fistula created 07/2012.  . Colon polyps   . COPD (chronic obstructive pulmonary disease) ([place])   . Diastolic dysfunction   . Gangrene of finger ([place])    a. 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  . GI (gastrointestinal bleed) feb 2016  . Hypertension    dx--"long time"  . Irregular heart rate 03/03/2015  . Pericardial effusion    a. Noted on echo 10/2009. b. Again seen on echo 09/2013.  [name] Retinitis pigmentosa    Blindness--has shunt --placed yrs ago in [place]..   Past Surgical History:  Procedure Laterality Date  . AMPUTATION  12/30/2012   Procedure: AMPUTATION DIGIT;  Surgeon: Tennis Must, MD;  Location: [facility];  Service: Orthopedics;  Laterality: Left;  LEFT SMALL FINGER AMPUTATION  . AV FISTULA PLACEMENT  07/15/2012   Procedure: ARTERIOVENOUS (AV) FISTULA CREATION;  Surgeon: Rosetta Posner, MD;  Location: [facility];  Service: Vascular;  Laterality: Left;  . CATARACT EXTRACTION W/ INTRAOCULAR LENS  IMPLANT, BILATERAL Bilateral   . KNEE LIGAMENT RECONSTRUCTION Left   . PERICARDIAL TAP N/A 09/19/2013   Procedure: PERICARDIAL TAP;  Surgeon: Blane Ohara, MD;  Location: [facility] C[facility];  Service: Cardiovascular;  Laterality: N/A;  . PERICARDIOCENTESIS  09/2013   Social History:   reports that he has  been smoking Cigarettes.  He has a 25.00 pack-year smoking history. He has never used smokeless tobacco. He reports that he does not drink alcohol or use drugs.  Family History  Problem Relation Age of Onset  . Ovarian cancer Mother   . Colon polyps Brother   . Colon cancer Brother   . Kidney disease Neg Hx   . Gallbladder disease Neg Hx   . Esophageal cancer Neg Hx   . Heart disease Neg Hx   . Stomach cancer Neg Hx     Medications: Patient's Medications  New Prescriptions   No medications on file    Previous Medications   ALLOPURINOL (ZYLOPRIM) 100 MG TABLET    Take 100 mg by mouth daily.    AMINO ACIDS-PROTEIN HYDROLYS (FEEDING SUPPLEMENT, PRO-STAT SUGAR FREE 64,) LIQD    Take 30 mLs by mouth 2 (two) times daily.   ASPIRIN 81 MG TABLET    Take 81 mg by mouth daily.   CARVEDILOL (COREG) 12.5 MG TABLET    Take 1 tablet (12.5 mg total) by mouth 2 (two) times daily with a meal.   DILTIAZEM (CARDIZEM CD) 120 MG 24 HR CAPSULE    Take 1 capsule (120 mg total) by mouth daily.   DOXERCALCIFEROL (HECTOROL) 4 MCG/2ML INJECTION    Inject 0.5 mLs (1 mcg total) into the vein every Monday, Wednesday, and Friday with hemodialysis.   FERRIC GLUCONATE 125 MG IN SODIUM CHLORIDE 0.9 % 100 ML    Inject 125 mg into the vein every Monday, Wednesday, and Friday with hemodialysis.   SPIRIVA RESPIMAT 2.5 MCG/ACT AERS    INHALE 2 PUFFS INTO THE LUNGS EVERY MORNING  Modified Medications   No medications on file  Discontinued Medications   No medications on file     Physical Exam: Vitals:   12/08/16 0918  BP: 98/60  Pulse: 72  Resp: 19  Temp: 97.2 F (36.2 C)  TempSrc: Oral  SpO2: 94%  Weight: 139 lb (63 kg)  Height: 5\' 3"  (1.6 m)    Physical Exam  Constitutional: He appears lethargic. No distress.  Ill appearing male, NAD Coughing frequently during visit  HENT:  Head: Normocephalic and atraumatic.  Mouth/Throat: Oropharynx is clear and moist. No oropharyngeal exudate.  Eyes: Conjunctivae and EOM are normal. Pupils are equal, round, and reactive to light.  Neck: Normal range of motion. Neck supple.  Cardiovascular: Normal rate, regular rhythm and normal heart sounds.   Pulmonary/Chest: Effort normal. He has wheezes. He has rales.  Abdominal: Soft. Bowel sounds are normal.  Musculoskeletal: He exhibits no edema.  Neurological: He appears lethargic.  Skin: Skin is warm and dry. He is not diaphoretic.    Labs reviewed: Basic Metabolic Panel:  Recent Labs  11/24/16 0541 11/25/16 0427  11/26/16 0341 11/27/16 0700  NA 135 134* 133* 131*  K 3.8 4.7 4.9 4.9  CL 96* 96* 94* 93*  CO2 24 25 24 22   GLUCOSE 110* 104* 108* 95  BUN 35* 38* 78* 113*  CREATININE 5.27* 4.51* 6.54* 8.72*  CALCIUM 8.7* 9.0 8.9 8.0*  PHOS 7.3*  --   --  7.5*   Liver Function Tests:  Recent Labs  11/23/16 0610 11/24/16 0541 11/27/16 0700  AST 24  --   --   ALT 18  --   --   ALKPHOS 51  --   --   BILITOT 0.7  --   --   PROT 7.3  --   --   ALBUMIN  3.3* 3.0* 3.2*   No results for input(s): LIPASE, AMYLASE in the last 8760 hours. No results for input(s): AMMONIA in the last 8760 hours. CBC:  Recent Labs  11/23/16 0610  11/25/16 0428 11/26/16 0341 11/27/16 0901  WBC 7.7  < > 9.6 9.6 6.1  NEUTROABS 6.4  --   --   --   --   HGB 7.9*  < > 9.5* 9.7* 9.7*  HCT 25.5*  < > 30.4* 31.2* 29.6*  MCV 95.1  < > 94.4 92.3 90.8  PLT 113*  < > 89* 85* 77*  < > = values in this interval not displayed. TSH: No results for input(s): TSH in the last 8760 hours. A1C: No results found for: HGBA1C Lipid Panel: No results for input(s): CHOL, HDL, LDLCALC, TRIG, CHOLHDL, LDLDIRECT in the last 8760 hours.   Assessment/Plan  1. Cough With congestion and wheezing with lethargy At time of visit O2 sats at 88%, 2L O2 added keep sats over 90% Will start avelox 400 mg daily for 7 days duenebs q 6 hours x 5 days mucinex DM by mouth twice daily Will have CBC with diff and BMP drawn at dialysis and sent back with pt  2. ESRD on hemodialysis ([place]) Cont on HD Monday, Wednesday and Friday  3. Chronic diastolic CHF (congestive heart failure) ([place]) -appears euvolemic, conts on HD, will follow up Chest xray to evaluate   Jessica K. Harle Battiest  [facility] [phone] 8 am - 5 pm) [phone] (after hours)

## 2016-12-11 LAB — BASIC METABOLIC PANEL
BUN: 54 mg/dL — AB (ref 4–21)
Creatinine: 7.3 mg/dL — AB (ref <=1.3)
GLUCOSE: 99 mg/dL
POTASSIUM: 4 mmol/L (ref 3.4–5.3)
SODIUM: 138 mmol/L (ref 137–147)

## 2016-12-15 ENCOUNTER — Emergency Department (HOSPITAL_COMMUNITY): Payer: Medicare Other

## 2016-12-15 ENCOUNTER — Inpatient Hospital Stay (HOSPITAL_COMMUNITY)
Admission: EM | Admit: 2016-12-15 | Discharge: 2016-12-21 | DRG: 811 | Disposition: A | Discharge status: Skilled Nursing Facility | Payer: Medicare Other | Attending: Internal Medicine | Admitting: Internal Medicine

## 2016-12-15 ENCOUNTER — Encounter (HOSPITAL_COMMUNITY): Payer: Self-pay

## 2016-12-15 DIAGNOSIS — D72819 Decreased white blood cell count, unspecified: Secondary | ICD-10-CM | POA: Diagnosis present

## 2016-12-15 DIAGNOSIS — D696 Thrombocytopenia, unspecified: Secondary | ICD-10-CM | POA: Diagnosis present

## 2016-12-15 DIAGNOSIS — N400 Enlarged prostate without lower urinary tract symptoms: Secondary | ICD-10-CM | POA: Diagnosis present

## 2016-12-15 DIAGNOSIS — Z8674 Personal history of sudden cardiac arrest: Secondary | ICD-10-CM | POA: Diagnosis not present

## 2016-12-15 DIAGNOSIS — E876 Hypokalemia: Secondary | ICD-10-CM | POA: Diagnosis present

## 2016-12-15 DIAGNOSIS — I5032 Chronic diastolic (congestive) heart failure: Secondary | ICD-10-CM | POA: Diagnosis present

## 2016-12-15 DIAGNOSIS — Z992 Dependence on renal dialysis: Secondary | ICD-10-CM

## 2016-12-15 DIAGNOSIS — Y95 Nosocomial condition: Secondary | ICD-10-CM | POA: Diagnosis present

## 2016-12-15 DIAGNOSIS — D6489 Other specified anemias: Secondary | ICD-10-CM | POA: Diagnosis present

## 2016-12-15 DIAGNOSIS — D509 Iron deficiency anemia, unspecified: Secondary | ICD-10-CM | POA: Diagnosis present

## 2016-12-15 DIAGNOSIS — F05 Delirium due to known physiological condition: Secondary | ICD-10-CM | POA: Diagnosis present

## 2016-12-15 DIAGNOSIS — Z7982 Long term (current) use of aspirin: Secondary | ICD-10-CM

## 2016-12-15 DIAGNOSIS — R1310 Dysphagia, unspecified: Secondary | ICD-10-CM | POA: Diagnosis present

## 2016-12-15 DIAGNOSIS — J441 Chronic obstructive pulmonary disease with (acute) exacerbation: Secondary | ICD-10-CM | POA: Diagnosis present

## 2016-12-15 DIAGNOSIS — Z79899 Other long term (current) drug therapy: Secondary | ICD-10-CM

## 2016-12-15 DIAGNOSIS — M109 Gout, unspecified: Secondary | ICD-10-CM | POA: Diagnosis present

## 2016-12-15 DIAGNOSIS — F0390 Unspecified dementia without behavioral disturbance: Secondary | ICD-10-CM | POA: Diagnosis present

## 2016-12-15 DIAGNOSIS — I480 Paroxysmal atrial fibrillation: Secondary | ICD-10-CM | POA: Diagnosis present

## 2016-12-15 DIAGNOSIS — I2510 Atherosclerotic heart disease of native coronary artery without angina pectoris: Secondary | ICD-10-CM | POA: Diagnosis present

## 2016-12-15 DIAGNOSIS — R109 Unspecified abdominal pain: Secondary | ICD-10-CM

## 2016-12-15 DIAGNOSIS — F17210 Nicotine dependence, cigarettes, uncomplicated: Secondary | ICD-10-CM | POA: Diagnosis present

## 2016-12-15 DIAGNOSIS — J440 Chronic obstructive pulmonary disease with acute lower respiratory infection: Secondary | ICD-10-CM | POA: Diagnosis present

## 2016-12-15 DIAGNOSIS — I132 Hypertensive heart and chronic kidney disease with heart failure and with stage 5 chronic kidney disease, or end stage renal disease: Secondary | ICD-10-CM | POA: Diagnosis present

## 2016-12-15 DIAGNOSIS — J189 Pneumonia, unspecified organism: Secondary | ICD-10-CM | POA: Diagnosis present

## 2016-12-15 DIAGNOSIS — D649 Anemia, unspecified: Secondary | ICD-10-CM | POA: Diagnosis not present

## 2016-12-15 DIAGNOSIS — K922 Gastrointestinal hemorrhage, unspecified: Secondary | ICD-10-CM | POA: Diagnosis present

## 2016-12-15 DIAGNOSIS — Z9842 Cataract extraction status, left eye: Secondary | ICD-10-CM

## 2016-12-15 DIAGNOSIS — K2951 Unspecified chronic gastritis with bleeding: Secondary | ICD-10-CM | POA: Diagnosis not present

## 2016-12-15 DIAGNOSIS — N186 End stage renal disease: Secondary | ICD-10-CM | POA: Diagnosis present

## 2016-12-15 DIAGNOSIS — H548 Legal blindness, as defined in USA: Secondary | ICD-10-CM | POA: Diagnosis present

## 2016-12-15 DIAGNOSIS — G9340 Encephalopathy, unspecified: Secondary | ICD-10-CM | POA: Diagnosis present

## 2016-12-15 DIAGNOSIS — D61818 Other pancytopenia: Secondary | ICD-10-CM | POA: Diagnosis present

## 2016-12-15 DIAGNOSIS — D631 Anemia in chronic kidney disease: Secondary | ICD-10-CM | POA: Diagnosis present

## 2016-12-15 DIAGNOSIS — Z961 Presence of intraocular lens: Secondary | ICD-10-CM | POA: Diagnosis present

## 2016-12-15 DIAGNOSIS — N2581 Secondary hyperparathyroidism of renal origin: Secondary | ICD-10-CM | POA: Diagnosis present

## 2016-12-15 DIAGNOSIS — R531 Weakness: Secondary | ICD-10-CM | POA: Diagnosis present

## 2016-12-15 DIAGNOSIS — Z9841 Cataract extraction status, right eye: Secondary | ICD-10-CM

## 2016-12-15 DIAGNOSIS — I10 Essential (primary) hypertension: Secondary | ICD-10-CM | POA: Diagnosis present

## 2016-12-15 LAB — BASIC METABOLIC PANEL
Anion gap: 10 (ref 5–15)
BUN: 13 mg/dL (ref 6–20)
CHLORIDE: 100 mmol/L — AB (ref 101–111)
CO2: 31 mmol/L (ref 22–32)
CREATININE: 3.02 mg/dL — AB (ref 0.61–1.24)
Calcium: 8.3 mg/dL — ABNORMAL LOW (ref 8.9–10.3)
GFR calc Af Amer: 23 mL/min — ABNORMAL LOW (ref >60)
GFR calc non Af Amer: 19 mL/min — ABNORMAL LOW (ref >60)
GLUCOSE: 113 mg/dL — AB (ref 65–99)
POTASSIUM: 3.2 mmol/L — AB (ref 3.5–5.1)
Sodium: 141 mmol/L (ref 135–145)

## 2016-12-15 LAB — CBC WITH DIFFERENTIAL/PLATELET
Basophils Absolute: 0 10*3/uL (ref 0.0–0.1)
Basophils Relative: 0 %
EOS PCT: 1 %
Eosinophils Absolute: 0 10*3/uL (ref 0.0–0.7)
HEMATOCRIT: 21 % — AB (ref 39.0–52.0)
HEMOGLOBIN: 6.5 g/dL — AB (ref 13.0–17.0)
LYMPHS ABS: 0.5 10*3/uL — AB (ref 0.7–4.0)
LYMPHS PCT: 23 %
MCH: 30.2 pg (ref 26.0–34.0)
MCHC: 31 g/dL (ref 30.0–36.0)
MCV: 97.7 fL (ref 78.0–100.0)
Monocytes Absolute: 0.2 10*3/uL (ref 0.1–1.0)
Monocytes Relative: 11 %
NEUTROS ABS: 1.5 10*3/uL — AB (ref 1.7–7.7)
Neutrophils Relative %: 66 %
PLATELETS: 43 10*3/uL — AB (ref 150–400)
RBC: 2.15 MIL/uL — ABNORMAL LOW (ref 4.22–5.81)
RDW: 17.8 % — ABNORMAL HIGH (ref 11.5–15.5)
WBC: 2.2 10*3/uL — ABNORMAL LOW (ref 4.0–10.5)

## 2016-12-15 LAB — PREPARE RBC (CROSSMATCH)

## 2016-12-15 LAB — I-STAT CG4 LACTIC ACID, ED: LACTIC ACID, VENOUS: 2.02 mmol/L — AB (ref 0.5–1.9)

## 2016-12-15 LAB — POC OCCULT BLOOD, ED: Fecal Occult Bld: POSITIVE — AB

## 2016-12-15 MED ORDER — DEXTROSE 5 % IV SOLN
2.0000 g | Freq: Once | INTRAVENOUS | Status: AC
  Administered 2016-12-15: 2 g via INTRAVENOUS
  Filled 2016-12-15: qty 2

## 2016-12-15 MED ORDER — SODIUM CHLORIDE 0.9 % IV SOLN
10.0000 mL/h | Freq: Once | INTRAVENOUS | Status: AC
  Administered 2016-12-15: 10 mL/h via INTRAVENOUS

## 2016-12-15 MED ORDER — VANCOMYCIN HCL IN DEXTROSE 750-5 MG/150ML-% IV SOLN: 750.0000 mg | INTRAVENOUS | Status: DC

## 2016-12-15 MED ORDER — DILTIAZEM HCL ER COATED BEADS 120 MG PO CP24
120.0000 mg | ORAL_CAPSULE | Freq: Every day | ORAL | Status: DC
  Administered 2016-12-16 – 2016-12-21 (×4): 120 mg via ORAL
  Filled 2016-12-15 (×5): qty 1

## 2016-12-15 MED ORDER — SODIUM CHLORIDE 0.9 % IV BOLUS (SEPSIS): 1000.0000 mL | Freq: Once | INTRAVENOUS | Status: DC

## 2016-12-15 MED ORDER — SODIUM CHLORIDE 0.9 % IV SOLN: 250.0000 mL | INTRAVENOUS | Status: DC | PRN

## 2016-12-15 MED ORDER — ALLOPURINOL 100 MG PO TABS
100.0000 mg | ORAL_TABLET | Freq: Every day | ORAL | Status: DC
  Administered 2016-12-16 – 2016-12-21 (×4): 100 mg via ORAL
  Filled 2016-12-15 (×5): qty 1

## 2016-12-15 MED ORDER — SODIUM CHLORIDE 0.9% FLUSH
3.0000 mL | Freq: Two times a day (BID) | INTRAVENOUS | Status: DC
  Administered 2016-12-15 – 2016-12-21 (×12): 3 mL via INTRAVENOUS

## 2016-12-15 MED ORDER — IPRATROPIUM-ALBUTEROL 0.5-2.5 (3) MG/3ML IN SOLN
3.0000 mL | Freq: Four times a day (QID) | RESPIRATORY_TRACT | Status: DC
  Administered 2016-12-15 – 2016-12-19 (×15): 3 mL via RESPIRATORY_TRACT
  Filled 2016-12-15 (×17): qty 3

## 2016-12-15 MED ORDER — POTASSIUM CHLORIDE CRYS ER 20 MEQ PO TBCR
20.0000 meq | EXTENDED_RELEASE_TABLET | Freq: Once | ORAL | Status: AC
Start: 2016-12-15 — End: 2016-12-15
  Administered 2016-12-15: 20 meq via ORAL
  Filled 2016-12-15: qty 1

## 2016-12-15 MED ORDER — ALBUTEROL SULFATE (2.5 MG/3ML) 0.083% IN NEBU
2.5000 mg | INHALATION_SOLUTION | RESPIRATORY_TRACT | Status: DC | PRN
  Administered 2016-12-16 – 2016-12-17 (×2): 2.5 mg via RESPIRATORY_TRACT
  Filled 2016-12-15 (×2): qty 3

## 2016-12-15 MED ORDER — CEFEPIME HCL 2 G IJ SOLR
2.0000 g | INTRAMUSCULAR | Status: DC
  Administered 2016-12-18 – 2016-12-20 (×2): 2 g via INTRAVENOUS
  Filled 2016-12-15 (×2): qty 2

## 2016-12-15 MED ORDER — SODIUM CHLORIDE 0.9% FLUSH
3.0000 mL | INTRAVENOUS | Status: DC | PRN
  Administered 2016-12-20: 3 mL via INTRAVENOUS
  Filled 2016-12-15: qty 3

## 2016-12-15 MED ORDER — DOXERCALCIFEROL 4 MCG/2ML IV SOLN: 1.0000 ug | INTRAVENOUS | Status: DC

## 2016-12-15 MED ORDER — CARVEDILOL 12.5 MG PO TABS
12.5000 mg | ORAL_TABLET | Freq: Two times a day (BID) | ORAL | Status: DC
  Administered 2016-12-16 – 2016-12-21 (×8): 12.5 mg via ORAL
  Filled 2016-12-15 (×9): qty 1

## 2016-12-15 MED ORDER — PANTOPRAZOLE SODIUM 40 MG IV SOLR
40.0000 mg | Freq: Two times a day (BID) | INTRAVENOUS | Status: DC
  Administered 2016-12-15 – 2016-12-20 (×9): 40 mg via INTRAVENOUS
  Filled 2016-12-15 (×9): qty 40

## 2016-12-15 MED ORDER — SODIUM CHLORIDE 0.9 % IV SOLN
1500.0000 mg | Freq: Once | INTRAVENOUS | Status: AC
  Administered 2016-12-16: 1500 mg via INTRAVENOUS
  Filled 2016-12-15 (×2): qty 1500

## 2016-12-15 NOTE — Progress Notes (Addendum)
Pharmacy Antibiotic Note  Cameron Mendez is a 71 y.o. male admitted on 12/15/2016 with pneumonia.  Pharmacy has been consulted for vancomycin dosing. Patient is ESRD on HD MWF- was brought here from HD center. Patient tells me he did not receive any antibiotics (or any other medications) at HD today.  One time of cefepime ordered by EDP.  Plan: -vancomycin 1500mg  IV x1, then vancomycin 750mg  IV qHD-MWF -cefepime 2g IV qMWF @ 2000 starting 1/8 -follow HD schedule/tolerance  -follow c/s, clinical progression, level PRN (pre-HD vancomycin level goal 15-16mcg/mL)  Height: 5\' 4"  (162.6 cm) Weight: 140 lb (63.5 kg) IBW/kg (Calculated) : 59.2  Temp (24hrs), Avg:98.5 F (36.9 C), Min:98.5 F (36.9 C), Max:98.5 F (36.9 C)   Recent Labs Lab 12/15/16 1820  WBC 2.2*  CREATININE 3.02*    Estimated Creatinine Clearance: 19.1 mL/min (by C-G formula based on SCr of 3.02 mg/dL (H)).    Allergies  Allergen Reactions  . Ibuprofen Other (See Comments)    Kidney problems-dialysis patient  . Nsaids Other (See Comments)    Kidney problems-dialysis patient    Antimicrobials this admission: vancomycin 1/5 >>  cefepime 1/5 >>   Dose adjustments this admission: n/a  Microbiology results: Nothing ordered  Thank you for allowing pharmacy to be a part of this patient's care.  Aniceto Kyser D. Amirah Goerke, PharmD, BCPS Clinical Pharmacist Pager: [phone] 12/15/2016 7:54 PM

## 2016-12-15 NOTE — ED Provider Notes (Signed)
[facility] DEPT Provider Note   CSN: [id number] Arrival date & time: 12/15/16  1707     History   Chief Complaint Chief Complaint  Patient presents with  . Abnormal Lab    HPI Cameron Mendez is a 71 y.o. male.  HPI   Patient is a 71 year old male with history of A. fib on anticoagulation, CKD on HD, hypertension, anemia, legally blind and COPD who presents to the ED via EMS from dialysis clinic with complaint of anemia. Clinic reports patient's with hemoglobin 7.3 and had been complaining of generalized weakness. Upon my initial evaluation in the ED, patient denies any pain or complaints at this time. He reports he has continued to have a cough over the past few weeks which has been present since his last admission last month for respiratory illness. Denies fever, chills, headache, lightheadedness, dizziness, hemoptysis, chest pain, shortness of breath, palpitations, abdominal pain, nausea, vomiting, diarrhea, blood in urine or stool, numbness, tingling, weakness. Patient states he has been taking his home medications as prescribed and has been going to dialysis as scheduled (Mon, Wed, Fri).    Past Medical History:  Diagnosis Date  . Anemia   . Arthritis    Gout  . Blind   . BPH (benign prostatic hyperplasia)   . Cardiac arrest ([place])   . CHF (congestive heart failure) ([place])   . CKD (chronic kidney disease), stage IV ([place])    a. L upper extremity AV fistula created 07/2012.  . Colon polyps   . COPD (chronic obstructive pulmonary disease) ([place])   . Diastolic dysfunction   . Gangrene of finger ([place])    a. 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  . GI (gastrointestinal bleed) feb 2016  . Hypertension    dx--"long time"  . Irregular heart rate 03/03/2015  . Pericardial effusion    a. Noted on echo 10/2009. b. Again seen on echo 09/2013.  [name] Retinitis pigmentosa    Blindness--has shunt --placed yrs ago in [place]..    Patient Active Problem List   Diagnosis Date  Noted  . GI bleed 12/15/2016  . Hypokalemia 12/15/2016  . Acute dyspnea 11/23/2016  . Cough 11/23/2016  . Respiratory distress 11/23/2016  . Symptomatic anemia 11/18/2015  . Acute coronary syndrome ([place]) 09/28/2015  . HCAP (healthcare-associated pneumonia) 09/18/2015  . Cardiac arrest ([place]) 09/13/2015  . Atrial fibrillation, currently in sinus rhythm ([place]) 03/03/2015  . Tobacco abuse 01/10/2015  . Pleural effusion 01/09/2015  . Chronic diastolic CHF (congestive heart failure) ([place]) 01/08/2015  . History of adenomatous polyp of colon 12/31/2014  . suspected COPD with acute exacerbation 09/10/2014  . Leukopenia 08/21/2014  . COPD (chronic obstructive pulmonary disease) ([place]) 05/12/2014  . COPD exacerbation ([place]) 04/29/2014  . History of pericardiocentesis 04/27/2014  . Volume overload 04/27/2014  . Anemia 09/18/2013  . Thrombocytopenia ([place]) 09/18/2013  . ESRD on hemodialysis ([place]) 09/18/2013  . Essential hypertension, benign 10/04/2009  . BLINDNESS, [place], [place] DEFINITION 10/01/2009  . OSTEOARTHRITIS 10/01/2009  . Gout, unspecified 09/28/2009    Past Surgical History:  Procedure Laterality Date  . AMPUTATION  12/30/2012   Procedure: AMPUTATION DIGIT;  Surgeon: Tennis Must, MD;  Location: [facility];  Service: Orthopedics;  Laterality: Left;  LEFT SMALL FINGER AMPUTATION  . AV FISTULA PLACEMENT  07/15/2012   Procedure: ARTERIOVENOUS (AV) FISTULA CREATION;  Surgeon: Rosetta Posner, MD;  Location: [facility];  Service: Vascular;  Laterality: Left;  . CATARACT EXTRACTION W/ INTRAOCULAR LENS  IMPLANT,  BILATERAL Bilateral   . KNEE LIGAMENT RECONSTRUCTION Left   . PERICARDIAL TAP N/A 09/19/2013   Procedure: PERICARDIAL TAP;  Surgeon: Blane Ohara, MD;  Location[facility] C[facility] LAB;  Service: Cardiovascular;  Laterality: N/A;  . PERICARDIOCENTESIS  09/2013       Home Medications    Prior to Admission medications   Medication Sig Start Date End Date Taking? Authorizing  Provider  allopurinol (ZYLOPRIM) 100 MG tablet Take 100 mg by mouth daily.    Yes Historical Provider, MD  aspirin 81 MG tablet Take 81 mg by mouth daily.   Yes Historical Provider, MD  diclofenac sodium (VOLTAREN) 1 % GEL Apply 2 g topically daily as needed (for pain).   Yes Historical Provider, MD  carvedilol (COREG) 12.5 MG tablet Take 1 tablet (12.5 mg total) by mouth 2 (two) times daily with a meal. 09/21/15   Bonnielee Haff, MD  diltiazem (CARDIZEM CD) 120 MG 24 hr capsule Take 1 capsule (120 mg total) by mouth daily. 10/01/15   Dixie Dials, MD  doxercalciferol (HECTOROL) 4 MCG/2ML injection Inject 0.5 mLs (1 mcg total) into the vein every Monday, Wednesday, and Friday with hemodialysis. 08/24/14   [name], DO  ferric gluconate 125 mg in sodium chloride 0.9 % 100 mL Inject 125 mg into the vein every Monday, Wednesday, and Friday with hemodialysis. 08/24/14   [name], DO  SPIRIVA RESPIMAT 2.5 MCG/ACT AERS INHALE 2 PUFFS INTO THE LUNGS EVERY MORNING Patient not taking: Reported on 12/15/2016 08/03/15   [name], MD    Family History Family History  Problem Relation Age of Onset  . Ovarian cancer Mother   . Colon polyps Brother   . Colon cancer Brother   . Kidney disease Neg Hx   . Gallbladder disease Neg Hx   . Esophageal cancer Neg Hx   . Heart disease Neg Hx   . Stomach cancer Neg Hx     Social History Social History  Substance Use Topics  . Smoking status: Current Every Day Smoker    Packs/day: 0.50    Years: 50.00    Types: Cigarettes  . Smokeless tobacco: Never Used     Comment: Pt given handout on how to quit smoking 12/31/14  . Alcohol use No     Comment: "quit driinking in ~ 2000"     Allergies   Ibuprofen and Nsaids   Review of Systems Review of Systems  Respiratory: Positive for cough.   All other systems reviewed and are negative.    Physical Exam Updated Vital Signs BP (!) 132/41   Pulse 100   Temp 98.5 F (36.9 C) (Oral)   Resp  14   Ht 5\' 4"  (1.626 m)   Wt 63.5 kg   SpO2 100%   BMI 24.03 kg/m   Physical Exam  Constitutional: He is oriented to person, place, and time. He appears well-developed and well-nourished. No distress.  Elderly-ill appearing male  HENT:  Head: Normocephalic and atraumatic.  Mouth/Throat: Uvula is midline, oropharynx is clear and moist and mucous membranes are normal. No oropharyngeal exudate, posterior oropharyngeal edema, posterior oropharyngeal erythema or tonsillar abscesses. No tonsillar exudate.  Eyes: Conjunctivae and EOM are normal. Right eye exhibits no discharge. Left eye exhibits no discharge. No scleral icterus.  Neck: Normal range of motion. Neck supple.  Cardiovascular: Normal rate, regular rhythm, normal heart sounds and intact distal pulses.   Pulmonary/Chest: Effort normal. No respiratory distress. He has no wheezes. He has  rales. He exhibits no tenderness.  Mild diffuse rales bilaterally  Abdominal: Soft. Bowel sounds are normal. He exhibits no distension and no mass. There is no tenderness. There is no rebound and no guarding. No hernia.  Genitourinary: Rectum normal. Rectal exam shows no external hemorrhoid, no internal hemorrhoid, no fissure, no mass, no tenderness and anal tone normal.  Genitourinary Comments: No gross blood on DRE.  Musculoskeletal: Normal range of motion. He exhibits no edema.  Neurological: He is alert and oriented to person, place, and time.  Pt alert and oriented to person, place and time.  Skin: Skin is warm and dry. He is not diaphoretic.  Nursing note and vitals reviewed.    ED Treatments / Results  Labs (all labs ordered are listed, but only abnormal results are displayed) Labs Reviewed  CBC WITH DIFFERENTIAL/PLATELET - Abnormal; Notable for the following:       Result Value   WBC 2.2 (*)    RBC 2.15 (*)    Hemoglobin 6.5 (*)    HCT 21.0 (*)    RDW 17.8 (*)    Platelets 43 (*)    Neutro Abs 1.5 (*)    Lymphs Abs 0.5 (*)    All  other components within normal limits  BASIC METABOLIC PANEL - Abnormal; Notable for the following:    Potassium 3.2 (*)    Chloride 100 (*)    Glucose, Bld 113 (*)    Creatinine, Ser 3.02 (*)    Calcium 8.3 (*)    GFR calc non Af Amer 19 (*)    GFR calc Af Amer 23 (*)    All other components within normal limits  POC OCCULT BLOOD, ED - Abnormal; Notable for the following:    Fecal Occult Bld POSITIVE (*)    All other components within normal limits  TYPE AND SCREEN  PREPARE RBC (CROSSMATCH)  PREPARE PLATELET PHERESIS    EKG  EKG Interpretation None       Radiology Dg Chest 2 View  Result Date: 12/15/2016 CLINICAL DATA:  Initial evaluation for acute cough, generalized weakness. EXAM: CHEST  2 VIEW COMPARISON:  Prior radiograph from 11/24/2016. FINDINGS: Cardiomegaly, stable from prior. Mediastinal silhouette within normal limits. Aortic atherosclerosis noted. Lungs mildly hypoinflated. Shunt catheter tubing overlies the thorax. Diffuse vascular congestion without overt pulmonary edema. Small right pleural effusion. Patchy right basilar opacity, suspicious for possible infiltrate given the provided history. No pneumothorax. No acute osseous abnormality. IMPRESSION: 1. Patchy right basilar opacity, suspicious for possible infiltrate given the provided history of cough. 2. Stable cardiomegaly with diffuse pulmonary vascular congestion without overt pulmonary edema. 3. Small right pleural effusion. 4. Aortic atherosclerosis. Electronically Signed   By: Jeannine Boga M.D.   On: 12/15/2016 18:41    Procedures Procedures (including critical care time)  Medications Ordered in ED Medications  0.9 %  sodium chloride infusion (not administered)  ceFEPIme (MAXIPIME) 2 g in dextrose 5 % 50 mL IVPB (not administered)  vancomycin (VANCOCIN) 1,500 mg in sodium chloride 0.9 % 500 mL IVPB (not administered)  vancomycin (VANCOCIN) IVPB 750 mg/150 ml premix (not administered)     Initial  Impression / Assessment and Plan / ED Course  I have reviewed the triage vital signs and the nursing notes.  Pertinent labs & imaging results that were available during my care of the patient were reviewed by me and considered in my medical decision making (see chart for details).  Clinical Course     Patient presents from  dialysis center due to low hemoglobin. Patient denies any pain or complaints on my initial evaluation. Denies hemoptysis, hematuria, melena, hematochezia. VSS. Exam revealed mild diffuse rales bilaterally. Remaining exam unremarkable. Abdominal exam benign. Rectal exam revealed no gross blood.   Chart review shows pt was admitted on 11/23/16 for acute hypoxic respiratory failure. Pt was noted to have hgb 7.3 and was given 1 unit PRBC. Pt was d/c home on 11/27/16.   Hgb 6.5. Hemoccult positive. 1unit blood ordered. CXR showed patchy right basilar opacity suspicious for possible infiltrate. Due to pt with continued cough over the past few weeks since his last admission last month with start pt on antibiotics for suspected HCAP. Consulted hospitalist for admission. Dr. Myna Hidalgo agrees to admission. Orders placed for obs tele.   Final Clinical Impressions(s) / ED Diagnoses   Final diagnoses:  Gastrointestinal hemorrhage, unspecified gastrointestinal hemorrhage type  Anemia, unspecified type  HCAP (healthcare-associated pneumonia)    New Prescriptions New Prescriptions   No medications on file     Nona Dell, PA-C 12/15/16 2019    Jola Schmidt, MD 12/16/16 [phone]

## 2016-12-15 NOTE — H&P (Addendum)
History and Physical    Cameron Mendez [id number] DOB: October 27, 1946 DOA: 12/15/2016  PCP: [name], MD   Patient coming from: SNF   Chief Complaint: Fatigue, cough, dyspnea  HPI: Cameron Mendez is a 71 y.o. male with medical history significant for end-stage renal disease on hemodialysis, COPD, chronic diastolic CHF, paroxysmal atrial fibrillation, chronic thrombocytopenia, and anemia who presents to the emergency department from his dialysis center for evaluation of generalized weakness, confusion, cough, and acute on chronic anemia. Patient reports that he had been in his usual state of health until approximately 2-3 days ago when he developed shortness of breath and cough. Since that time, the cough and dyspnea have worsened, and had become accompanied by generalized weakness and fatigue. Patient denies any fevers or chills and denies any chest pain or palpitations. Patient denies any nausea, vomiting, or diarrhea. He is blind and unable to comment on the presence of melena or hematochezia. Basic lab work was obtained at the dialysis center today and his hemoglobin was reportedly low at 7.3, down 2 g from a recent prior. He was directed to the emergency department for further evaluation of this. He was reportedly confused upon arrival for low of EMS, but is fully oriented in the ED.    ED Course: Upon arrival to the ED, patient is found to be afebrile, saturating adequately on room air, soft but stable blood pressure, and normal respirations and heart rate. Chest x-ray is notable for patchy right basilar opacity suspicious for an infiltrate, as well as stable cardiomegaly with diffuse pulmonary vascular congestion, but no overt edema. Chemistry panel features a hypokalemia to 3.2 and CBC is notable for leukopenia 2 2200, anemia with hemoglobin 6.5, down from priors in the 8-10 range, and acute on chronic thrombocytopenia with platelets 43,000. Stool is positive for occult blood. One unit of  packed red blood cells were ordered for immediate transfusion, patient was started on empiric vancomycin and cefepime, has remained hemodynamically stable and with no apparent bleeding in the emergency department, and will be admitted to the telemetry unit for ongoing evaluation and management of symptomatic anemia with suspected GI bleeding, thrombocytopenia, and HCAP.  Review of Systems:  All other systems reviewed and apart from HPI, are negative.  Past Medical History:  Diagnosis Date  . Anemia   . Arthritis    Gout  . Blind   . BPH (benign prostatic hyperplasia)   . Cardiac arrest ([place])   . CHF (congestive heart failure) ([place])   . CKD (chronic kidney disease), stage IV ([place])    a. L upper extremity AV fistula created 07/2012.  . Colon polyps   . COPD (chronic obstructive pulmonary disease) ([place])   . Diastolic dysfunction   . Gangrene of finger ([place])    a. 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  . GI (gastrointestinal bleed) feb 2016  . Hypertension    dx--"long time"  . Irregular heart rate 03/03/2015  . Pericardial effusion    a. Noted on echo 10/2009. b. Again seen on echo 09/2013.  [name] Retinitis pigmentosa    Blindness--has shunt --placed yrs ago in [place]..    Past Surgical History:  Procedure Laterality Date  . AMPUTATION  12/30/2012   Procedure: AMPUTATION DIGIT;  Surgeon: Tennis Must, MD;  Location: [facility];  Service: Orthopedics;  Laterality: Left;  LEFT SMALL FINGER AMPUTATION  . AV FISTULA PLACEMENT  07/15/2012   Procedure: ARTERIOVENOUS (AV) FISTULA CREATION;  Surgeon: Sherren Mocha  Katina Dung, MD;  Location: [facility];  Service: Vascular;  Laterality: Left;  . CATARACT EXTRACTION W/ INTRAOCULAR LENS  IMPLANT, BILATERAL Bilateral   . KNEE LIGAMENT RECONSTRUCTION Left   . PERICARDIAL TAP N/A 09/19/2013   Procedure: PERICARDIAL TAP;  Surgeon: Blane Ohara, MD;  Location: [facility] CATH LAB;  Service: Cardiovascular;  Laterality: N/A;  . PERICARDIOCENTESIS   09/2013     reports that he has been smoking Cigarettes.  He has a 25.00 pack-year smoking history. He has never used smokeless tobacco. He reports that he does not drink alcohol or use drugs.  Allergies  Allergen Reactions  . Ibuprofen Other (See Comments)    Kidney problems-dialysis patient  . Nsaids Other (See Comments)    Kidney problems-dialysis patient    Family History  Problem Relation Age of Onset  . Ovarian cancer Mother   . Colon polyps Brother   . Colon cancer Brother   . Kidney disease Neg Hx   . Gallbladder disease Neg Hx   . Esophageal cancer Neg Hx   . Heart disease Neg Hx   . Stomach cancer Neg Hx      Prior to Admission medications   Medication Sig Start Date End Date Taking? Authorizing Provider  allopurinol (ZYLOPRIM) 100 MG tablet Take 100 mg by mouth daily.    Yes Historical Provider, MD  aspirin 81 MG tablet Take 81 mg by mouth daily.   Yes Historical Provider, MD  diclofenac sodium (VOLTAREN) 1 % GEL Apply 2 g topically daily as needed (for pain).   Yes Historical Provider, MD  carvedilol (COREG) 12.5 MG tablet Take 1 tablet (12.5 mg total) by mouth 2 (two) times daily with a meal. 09/21/15   Bonnielee Haff, MD  diltiazem (CARDIZEM CD) 120 MG 24 hr capsule Take 1 capsule (120 mg total) by mouth daily. 10/01/15   Dixie Dials, MD  doxercalciferol (HECTOROL) 4 MCG/2ML injection Inject 0.5 mLs (1 mcg total) into the vein every Monday, Wednesday, and Friday with hemodialysis. 08/24/14   [name], DO  ferric gluconate 125 mg in sodium chloride 0.9 % 100 mL Inject 125 mg into the vein every Monday, Wednesday, and Friday with hemodialysis. 08/24/14   [name], DO  SPIRIVA RESPIMAT 2.5 MCG/ACT AERS INHALE 2 PUFFS INTO THE LUNGS EVERY MORNING Patient not taking: Reported on 12/15/2016 08/03/15   [name], MD    Physical Exam: Vitals:   12/15/16 1745 12/15/16 1800 12/15/16 1842 12/15/16 1845  BP:  (!) 129/43 (!) 136/42 (!) 132/41  Pulse:   100     Resp:  16 20 14   Temp: 98.5 F (36.9 C)     TempSrc: Oral     SpO2:   100%   Weight:      Height:          Constitutional: NAD, calm, comfortable, chronically-ill appearing Eyes: PERTLA, lids and conjunctivae normal ENMT: Mucous membranes are moist. Posterior pharynx clear of any exudate or lesions.   Neck: normal, supple, no masses, no thyromegaly Respiratory: Coarse rhonchi at right base, expiratory wheezes throughout. No acute distress, pallor, or diaphoresis.  Cardiovascular: S1 & S2 heard, regular rate and rhythm, grade 3 holosystolic murmur at apex. No extremity edema. JVP 10 cm H2O. AVF in proximal RUE.  Abdomen: No distension, no tenderness, no masses palpated. Bowel sounds normal.  Musculoskeletal: no clubbing / cyanosis. No joint deformity upper and lower extremities. Normal muscle tone.  Skin: Extensive scarring over distal LLE without  acute ulcer or wound. Warm, dry, well-perfused. Neurologic: No gross facial asymmetry. Speech fluent. Sensation intact, DTR normal. Strength 5/5 in all 4 limbs.  Psychiatric: Normal judgment and insight. Alert and oriented x 3. Normal mood and affect.     Labs on Admission: I have personally reviewed following labs and imaging studies  CBC:  Recent Labs Lab 12/15/16 1820  WBC 2.2*  NEUTROABS 1.5*  HGB 6.5*  HCT 21.0*  MCV 97.7  PLT 43*   Basic Metabolic Panel:  Recent Labs Lab 12/15/16 1820  NA 141  K 3.2*  CL 100*  CO2 31  GLUCOSE 113*  BUN 13  CREATININE 3.02*  CALCIUM 8.3*   GFR: Estimated Creatinine Clearance: 19.1 mL/min (by C-G formula based on SCr of 3.02 mg/dL (H)). Liver Function Tests: No results for input(s): AST, ALT, ALKPHOS, BILITOT, PROT, ALBUMIN in the last 168 hours. No results for input(s): LIPASE, AMYLASE in the last 168 hours. No results for input(s): AMMONIA in the last 168 hours. Coagulation Profile: No results for input(s): INR, PROTIME in the last 168 hours. Cardiac Enzymes: No results  for input(s): CKTOTAL, CKMB, CKMBINDEX, TROPONINI in the last 168 hours. BNP (last 3 results) No results for input(s): PROBNP in the last 8760 hours. HbA1C: No results for input(s): HGBA1C in the last 72 hours. CBG: No results for input(s): GLUCAP in the last 168 hours. Lipid Profile: No results for input(s): CHOL, HDL, LDLCALC, TRIG, CHOLHDL, LDLDIRECT in the last 72 hours. Thyroid Function Tests: No results for input(s): TSH, T4TOTAL, FREET4, T3FREE, THYROIDAB in the last 72 hours. Anemia Panel: No results for input(s): VITAMINB12, FOLATE, FERRITIN, TIBC, IRON, RETICCTPCT in the last 72 hours. Urine analysis:    Component Value Date/Time   COLORURINE YELLOW 11/18/2015 0902   APPEARANCEUR CLOUDY (A) 11/18/2015 0902   LABSPEC 1.011 11/18/2015 0902   LABSPEC 1.015 03/09/2009 1116   PHURINE 8.5 (H) 11/18/2015 0902   GLUCOSEU NEGATIVE 11/18/2015 0902   HGBUR SMALL (A) 11/18/2015 0902   BILIRUBINUR NEGATIVE 11/18/2015 0902   BILIRUBINUR Negative 03/09/2009 1116   KETONESUR NEGATIVE 11/18/2015 0902   PROTEINUR 100 (A) 11/18/2015 0902   UROBILINOGEN 0.2 03/03/2015 1950   NITRITE NEGATIVE 11/18/2015 0902   LEUKOCYTESUR SMALL (A) 11/18/2015 0902   LEUKOCYTESUR Trace 03/09/2009 1116   Sepsis Labs: @LABRCNTIP (procalcitonin:4,lacticidven:4) )No results found for this or any previous visit (from the past 240 hour(s)).   Radiological Exams on Admission: Dg Chest 2 View  Result Date: 12/15/2016 CLINICAL DATA:  Initial evaluation for acute cough, generalized weakness. EXAM: CHEST  2 VIEW COMPARISON:  Prior radiograph from 11/24/2016. FINDINGS: Cardiomegaly, stable from prior. Mediastinal silhouette within normal limits. Aortic atherosclerosis noted. Lungs mildly hypoinflated. Shunt catheter tubing overlies the thorax. Diffuse vascular congestion without overt pulmonary edema. Small right pleural effusion. Patchy right basilar opacity, suspicious for possible infiltrate given the provided  history. No pneumothorax. No acute osseous abnormality. IMPRESSION: 1. Patchy right basilar opacity, suspicious for possible infiltrate given the provided history of cough. 2. Stable cardiomegaly with diffuse pulmonary vascular congestion without overt pulmonary edema. 3. Small right pleural effusion. 4. Aortic atherosclerosis. Electronically Signed   By: Jeannine Boga M.D.   On: 12/15/2016 18:41    EKG: Independently reviewed. Sinus rhythm with diffuse repolarization abnormality, similar to most recent prior, improved from older EKGs.   Assessment/Plan  1. Symptomatic anemia, GI bleed  - Pt reports 2-3 days of generalized weakness and fatigue; denies N/V or diarrhea; blind and therefore unable to deny melena  or hematochezia  - Sent from HD for Hgb 7.3, down from recent priors in 8-10 range - No active bleeding observed in ED; FOBT+ - Hgb is 6.5 in the ED and 1 unit of pRBCs ordered for immediate transfusion  - Platelets 43,000 and 1 unit platelets ordered  - RN asked to place order for post-transfusion CBC  - EGD in March 2016 with chronic gastritis, duodenal bulb inflammation, polyp in cardia  - Colonoscopy March 2016 with mild ascending colon diverticulosis and moderate sigmoid diverticulosis, multiple polyps snared  - Will treat with Protonix 40 mg IV q12h, follow-up post-transfusion CBC, hold his ASA 81   2. HCAP  - Pt presents with 2-3 days of cough, dyspnea, wheezing - Rhonchi on exam, CXR with right basilar infiltrate, no hypoxia  - Pt is leukopenic, but afebrile, normal respiratory rate, no hypoxia; he was reportedly confused at HD, but is fully oriented on admission interview  - Obtain blood and sputum cultures, check respiratory virus panel, check strep pneumo urine antigen - Continue empiric treatment with vancomycin and cefepime initially given residence in SNF, HD, and recent hospitalization    3. COPD with acute exacerbation  - Likely precipitated by the PNA  -  Treating with abx as above  - Hold Spiriva and treat with scheduled DuoNeb  - Will try to avoid steroids given concern for acute GIB   4. ESRD   - Pt reports completing HD without incident just prior to admission on 12/16/15  - He will receive fluids with blood products and abx  - He reports still making urine; will follow strict I/Os, consider Lasix between or after transfusions as needed    5. Leukopenia, thrombocytopenia  - WBC is 2,200 on admission, down from priors, but similar to values from 2015; possibly exacerbated by acute infection; he is on broad-spectrum abx as above  - Platelets 43,000 on admission, down from recent priors, similar to values in 2015, possibly exacerbated by acute infection; 1 unit platelets transfused on admission given GIB  - CBC will be repeated after transfusion    6. Chronic diastolic CHF  - Pt appears roughly euvolemic on admission, maybe slightly volume-up; CXR demonstrates diffuse congestion without overt edema prior to transfusions  - TTE (09/29/15) with EF [id number], grade 1 diastolic dysfunction, severe LAE, severe MR, moderate TR  - Will follow strict I/Os, consider Lasix between or after transfusions    7. Atrial fibrillation, paroxysmal   - In a sinus rhythm on admission   - CHADS-VASc 4 (age, CHF, HTN, CAD) - He is not anticoagulated due to chronic thrombocytopenia with GIBs  - Continue diltiazem as tolerated    8. Hypertension  - BP soft but stable in ED  - Continue Coreg with holding parameters    9. Hypokalemia - Potassium is 3.2 on admission  - He is given 20 mEq oral potassium x1 - He is being monitored on telemetry  - Chem panel will be repeated in am     DVT prophylaxis: SCD's Code Status: Full Family Communication: Discussed with patient Disposition Plan: Admit to telemetry Consults called: None Admission status: Inpatient    Vianne Bulls, MD [facility] Pager [phone]  If 7PM-7AM, please contact  night-coverage [email or web address] Password [email or web address]  12/15/2016, 8:22 PM

## 2016-12-15 NOTE — ED Triage Notes (Signed)
[facility]- pt here from dialysis with low hgb, 7.3. Pt c/o generalized weakness. Pt is confused, only alert to himself. IV in place. 140/50, 68bpm, rr18, CBG 137.

## 2016-12-15 NOTE — Care Management Note (Addendum)
Case Management Note  Patient Details  Name: COLYN ANGRISANI MRN: [id number] Date of Birth: Jan 31, 1946  Subjective/Objective:          Patient presents to [facility] ED from HD center M/W/F by EMS with c/o anemia Hgb 7.3 with generalized weakness.    Patient recently discharged from hospital 12/19 to [facility] for rehab.       Action/Plan: Patient plans to return to complete rehab. Therapy. CM will be available for consult if transitional care  needs changes  Expected Discharge Date:                  Expected Discharge Plan:  [place] ([place])  In-House Referral:  Clinical Social Work  Discharge planning Services     Post Acute Care Choice:    Choice offered to:  Patient, Spouse Henley Waheed [phone]  DME Arranged:    DME Agency:     HH Arranged:    [place] Agency:     Status of Service:  In process, will continue to follow  If discussed at Long Length of Stay Meetings, dates discussed:    Additional CommentsLaurena Slimmer, RN 12/15/2016, 8:56 PM

## 2016-12-16 ENCOUNTER — Inpatient Hospital Stay (HOSPITAL_COMMUNITY): Payer: Medicare Other

## 2016-12-16 DIAGNOSIS — D649 Anemia, unspecified: Secondary | ICD-10-CM

## 2016-12-16 DIAGNOSIS — D696 Thrombocytopenia, unspecified: Secondary | ICD-10-CM

## 2016-12-16 DIAGNOSIS — K2951 Unspecified chronic gastritis with bleeding: Secondary | ICD-10-CM

## 2016-12-16 LAB — LACTIC ACID, PLASMA: Lactic Acid, Venous: 2.3 mmol/L (ref 0.5–1.9)

## 2016-12-16 LAB — BASIC METABOLIC PANEL
Anion gap: 10 (ref 5–15)
BUN: 22 mg/dL — ABNORMAL HIGH (ref 6–20)
CO2: 27 mmol/L (ref 22–32)
Calcium: 8.2 mg/dL — ABNORMAL LOW (ref 8.9–10.3)
Chloride: 102 mmol/L (ref 101–111)
Creatinine, Ser: 4.26 mg/dL — ABNORMAL HIGH (ref 0.61–1.24)
GFR calc Af Amer: 15 mL/min — ABNORMAL LOW (ref >60)
GFR, EST NON AFRICAN AMERICAN: 13 mL/min — AB (ref >60)
GLUCOSE: 81 mg/dL (ref 65–99)
POTASSIUM: 4.6 mmol/L (ref 3.5–5.1)
SODIUM: 139 mmol/L (ref 135–145)

## 2016-12-16 LAB — DIRECT ANTIGLOBULIN TEST (NOT AT ARMC)
DAT, IGG: NEGATIVE
DAT, complement: NEGATIVE

## 2016-12-16 LAB — PREPARE PLATELET PHERESIS
BLOOD PRODUCT EXPIRATION DATE: 201801052359
ISSUE DATE / TIME: 201801052039
UNIT TYPE AND RH: 6200

## 2016-12-16 LAB — CBC WITH DIFFERENTIAL/PLATELET
Basophils Absolute: 0 10*3/uL (ref 0.0–0.1)
Basophils Relative: 0 %
EOS ABS: 0 10*3/uL (ref 0.0–0.7)
EOS PCT: 0 %
HCT: 25.6 % — ABNORMAL LOW (ref 39.0–52.0)
Hemoglobin: 8 g/dL — ABNORMAL LOW (ref 13.0–17.0)
LYMPHS ABS: 0.6 10*3/uL — AB (ref 0.7–4.0)
LYMPHS PCT: 24 %
MCH: 29.3 pg (ref 26.0–34.0)
MCHC: 31.3 g/dL (ref 30.0–36.0)
MCV: 93.8 fL (ref 78.0–100.0)
Monocytes Absolute: 0.3 10*3/uL (ref 0.1–1.0)
Monocytes Relative: 12 %
Neutro Abs: 1.5 10*3/uL — ABNORMAL LOW (ref 1.7–7.7)
Neutrophils Relative %: 63 %
PLATELETS: 33 10*3/uL — AB (ref 150–400)
RBC: 2.73 MIL/uL — AB (ref 4.22–5.81)
RDW: 18.4 % — AB (ref 11.5–15.5)
WBC: 2.3 10*3/uL — ABNORMAL LOW (ref 4.0–10.5)

## 2016-12-16 LAB — CBC
HCT: 25.5 % — ABNORMAL LOW (ref 39.0–52.0)
Hemoglobin: 7.9 g/dL — ABNORMAL LOW (ref 13.0–17.0)
MCH: 29.7 pg (ref 26.0–34.0)
MCHC: 31 g/dL (ref 30.0–36.0)
MCV: 95.9 fL (ref 78.0–100.0)
Platelets: 64 10*3/uL — ABNORMAL LOW (ref 150–400)
RBC: 2.66 MIL/uL — AB (ref 4.22–5.81)
RDW: 19 % — ABNORMAL HIGH (ref 11.5–15.5)
WBC: 2.9 10*3/uL — ABNORMAL LOW (ref 4.0–10.5)

## 2016-12-16 LAB — MAGNESIUM: Magnesium: 1.8 mg/dL (ref 1.7–2.4)

## 2016-12-16 LAB — IRON AND TIBC
Iron: 46 ug/dL (ref 45–182)
SATURATION RATIOS: 26 % (ref 17.9–39.5)
TIBC: 176 ug/dL — ABNORMAL LOW (ref 250–450)
UIBC: 130 ug/dL

## 2016-12-16 LAB — RESPIRATORY PANEL BY PCR
ADENOVIRUS-RVPPCR: NOT DETECTED
Bordetella pertussis: NOT DETECTED
CORONAVIRUS NL63-RVPPCR: NOT DETECTED
CORONAVIRUS OC43-RVPPCR: NOT DETECTED
Chlamydophila pneumoniae: NOT DETECTED
Coronavirus 229E: NOT DETECTED
Coronavirus HKU1: NOT DETECTED
INFLUENZA A-RVPPCR: NOT DETECTED
INFLUENZA B-RVPPCR: NOT DETECTED
Metapneumovirus: NOT DETECTED
Mycoplasma pneumoniae: NOT DETECTED
PARAINFLUENZA VIRUS 1-RVPPCR: NOT DETECTED
PARAINFLUENZA VIRUS 3-RVPPCR: NOT DETECTED
PARAINFLUENZA VIRUS 4-RVPPCR: NOT DETECTED
Parainfluenza Virus 2: NOT DETECTED
RESPIRATORY SYNCYTIAL VIRUS-RVPPCR: NOT DETECTED
RHINOVIRUS / ENTEROVIRUS - RVPPCR: NOT DETECTED

## 2016-12-16 LAB — HEPATIC FUNCTION PANEL
ALK PHOS: 67 U/L (ref 38–126)
ALT: 15 U/L — AB (ref 17–63)
AST: 26 U/L (ref 15–41)
Albumin: 2.9 g/dL — ABNORMAL LOW (ref 3.5–5.0)
BILIRUBIN DIRECT: 0.3 mg/dL (ref 0.1–0.5)
Indirect Bilirubin: 1 mg/dL — ABNORMAL HIGH (ref 0.3–0.9)
Total Bilirubin: 1.3 mg/dL — ABNORMAL HIGH (ref 0.3–1.2)
Total Protein: 6.5 g/dL (ref 6.5–8.1)

## 2016-12-16 LAB — RETICULOCYTES
RBC.: 2.79 MIL/uL — AB (ref 4.22–5.81)
RETIC CT PCT: 2.4 % (ref 0.4–3.1)
Retic Count, Absolute: 67 10*3/uL (ref 19.0–186.0)

## 2016-12-16 LAB — FOLATE: FOLATE: 8.6 ng/mL (ref >5.9)

## 2016-12-16 LAB — VITAMIN B12: Vitamin B-12: 755 pg/mL (ref 180–914)

## 2016-12-16 LAB — TYPE AND SCREEN
BLOOD PRODUCT EXPIRATION DATE: 201801192359
ISSUE DATE / TIME: 201801052302
Unit Type and Rh: 5100

## 2016-12-16 LAB — STREP PNEUMONIAE URINARY ANTIGEN: Strep Pneumo Urinary Antigen: NEGATIVE

## 2016-12-16 LAB — MRSA PCR SCREENING: MRSA by PCR: NEGATIVE

## 2016-12-16 LAB — LACTATE DEHYDROGENASE: LDH: 276 U/L — ABNORMAL HIGH (ref 98–192)

## 2016-12-16 MED ORDER — SODIUM CHLORIDE 0.9 % IV SOLN
Freq: Once | INTRAVENOUS | Status: AC
  Administered 2016-12-16: 250 mL via INTRAVENOUS

## 2016-12-16 MED ORDER — ACETAMINOPHEN 325 MG PO TABS
650.0000 mg | ORAL_TABLET | Freq: Four times a day (QID) | ORAL | Status: DC | PRN
  Administered 2016-12-16: 650 mg via ORAL
  Filled 2016-12-16: qty 2

## 2016-12-16 MED ORDER — FUROSEMIDE 10 MG/ML IJ SOLN
20.0000 mg | Freq: Once | INTRAMUSCULAR | Status: AC
  Administered 2016-12-16: 20 mg via INTRAVENOUS
  Filled 2016-12-16: qty 2

## 2016-12-16 MED ORDER — POTASSIUM CHLORIDE CRYS ER 20 MEQ PO TBCR
40.0000 meq | EXTENDED_RELEASE_TABLET | Freq: Once | ORAL | Status: AC
  Administered 2016-12-16: 40 meq via ORAL
  Filled 2016-12-16: qty 2

## 2016-12-16 MED ORDER — GUAIFENESIN ER 600 MG PO TB12
1200.0000 mg | ORAL_TABLET | Freq: Two times a day (BID) | ORAL | Status: DC
  Administered 2016-12-16 – 2016-12-21 (×6): 1200 mg via ORAL
  Filled 2016-12-16 (×9): qty 2

## 2016-12-16 NOTE — Progress Notes (Signed)
Patient restless and moaning; asked if he is in pain he said "yes" but patient was unable to describe his pain. Per MD order patient will receive Tylenol 650 mg po. Will continue to monitor.

## 2016-12-16 NOTE — Progress Notes (Signed)
Patient arrived on the unit via bed.  Pt oriented to person and place only.  Patient is blind in both eyes with upper and lower dentures.  Platelets are infusing in the right [place] and NS @ 68ml/hr in the right hand.  Skin assessent completed with charge nurse Asia J. Skin intact but very dry.  Informed the pt of the valuables policy.  Ordered a touch pad  so that pt can reach staff. Lowered the bed and turned on bed alarm.

## 2016-12-16 NOTE — Progress Notes (Signed)
Pt scheduled for PO potassium chloride.  Pt current diet NPO.  Notified the MD on call.  Dr Abner Greenspan stated its okay to administer the scheduled med.

## 2016-12-16 NOTE — Progress Notes (Signed)
Patient V tach with 1 run of 6 beats.  Notified on call Md.

## 2016-12-16 NOTE — Progress Notes (Signed)
PROGRESS NOTE                                                                                                                                                                                                             Patient Demographics:    Cameron Mendez, is a 71 y.o. male, DOB - Nov 18, 1946, [id number]  Admit date - 12/15/2016   Admitting Physician Vianne Bulls, MD  Outpatient Primary MD for the patient is [name], MD  LOS - 1  Chief Complaint  Patient presents with  . Abnormal Lab       Brief Narrative     Subjective:    Cameron Mendez today has, No headache, No chest pain, No abdominal pain - No Nausea, No evidence of GI bleed, had bowel movement this a.m., normal color.   Assessment  & Plan :    Principal Problem:   Symptomatic anemia Active Problems:   Essential hypertension, benign   Thrombocytopenia (HCC)   ESRD on hemodialysis (HCC)   COPD exacerbation (HCC)   Leukopenia   Chronic diastolic CHF (congestive heart failure) (HCC)   Paroxysmal atrial fibrillation (HCC)   HCAP (healthcare-associated pneumonia)   GI bleed   Hypokalemia  Symptomatic anemia, GI bleed  - Hemoglobin 6.5 on admission, baseline in 7-8 range. - Hemoccult positive stool on admission, but no evidence of gross bleeding, normal call color bowel movement today, but  FOBT+ - EGD in March 2016 with chronic gastritis, duodenal bulb inflammation, polyp in cardia  - Colonoscopy March 2016 with mild ascending colon diverticulosis and moderate sigmoid diverticulosis, multiple polyps snared  - Will treat with Protonix 40 mg IV q12h,  hold his ASA 81 , 1 H&H and transfuse as needed, received 1 unit PRBC 1/5 with good response, hemoglobin is 8 this a.m..  HCAP  - Pt presents with 2-3 days of cough, dyspnea, wheezing - Rhonchi on exam, CXR with right basilar infiltrate, no hypoxia  - Continue empiric treatment with vancomycin and cefepime initially given  residence in SNF, HD, and recent hospitalization    COPD with acute exacerbation  - Likely precipitated by the PNA  - Treating with abx as above  - Hold Spiriva and treat with scheduled DuoNeb  - Will try to avoid steroids given concern for acute GIB   ESRD   -  Pt reports completing HD without incident just prior to admission on 12/16/15  - He will receive fluids with blood products and abx  - He reports still making urine; will follow strict I/Os, consider Lasix between or after transfusions as needed    thrombocytopenia  - Has chronic from cytopenia, but baseline lately in the 80s to 90s thousand, 43,000 on admission, transfused 1 unit, it is 33,000 this a.m. - Oncology input greatly appreciated, multifactorial probably related to infection, versus medication, versus adrenal dysfunction - Per oncology recommendation will transfuse unit of platelet, and obtain post platelet count to assess degree of response to blood transfusion  Chronic diastolic CHF  - Volume management with hemodialysis  Atrial fibrillation, paroxysmal   - In a sinus rhythm on admission   - CHADS-VASc 4 (age, CHF, HTN, CAD) - He is not anticoagulated due to chronic thrombocytopenia with GIBs  - Continue diltiazem as tolerated    Hypertension  - BP soft but stable in ED  - Continue Coreg with holding parameters    Code Status : Full  Family Communication  : None at bedside  Disposition Plan  : pending further workup.  Consults  :  Renal, oncology  Procedures  : none  DVT Prophylaxis  :  SCD  Lab Results  Component Value Date   PLT 33 (L) 12/16/2016    Antibiotics  :    Anti-infectives    Start     Dose/Rate Route Frequency Ordered Stop   12/18/16 2000  ceFEPIme (MAXIPIME) 2 g in dextrose 5 % 50 mL IVPB     2 g 100 mL/hr over 30 Minutes Intravenous Every M-W-F (2000) 12/15/16 2026     12/18/16 1200  vancomycin (VANCOCIN) IVPB 750 mg/150 ml premix     750 mg 150 mL/hr over 60 Minutes  Intravenous Every M-W-F (Hemodialysis) 12/15/16 1954     12/15/16 2000  vancomycin (VANCOCIN) 1,500 mg in sodium chloride 0.9 % 500 mL IVPB     1,500 mg 250 mL/hr over 120 Minutes Intravenous  Once 12/15/16 1954 12/16/16 0413   12/15/16 1945  ceFEPIme (MAXIPIME) 2 g in dextrose 5 % 50 mL IVPB     2 g 100 mL/hr over 30 Minutes Intravenous  Once 12/15/16 1940 12/15/16 2053        Objective:   Vitals:   12/16/16 0229 12/16/16 0521 12/16/16 0635 12/16/16 0727  BP:  (!) 143/38 (!) 132/40   Pulse:  80    Resp:  20    Temp:      TempSrc:  Oral    SpO2: 91% 92%  92%  Weight:      Height:        Wt Readings from Last 3 Encounters:  12/15/16 63.5 kg (140 lb)  12/08/16 63 kg (139 lb)  11/30/16 63 kg (139 lb)     Intake/Output Summary (Last 24 hours) at 12/16/16 1339 Last data filed at 12/16/16 1014  Gross per 24 hour  Intake           612.33 ml  Output                1 ml  Net           611.33 ml     Physical Exam  Awake Alert,  Legally blind Supple Neck,No JVD,  Symmetrical Chest wall movement, Good air movement bilaterally, RRR,No Gallops,Rubs or new Murmurs, No Parasternal Heave +ve B.Sounds, Abd Soft, No tenderness,  No rebound -  guarding or rigidity. No Cyanosis, Clubbing or edema, No new Rash or bruise      Data Review:    CBC  Recent Labs Lab 12/15/16 1820 12/16/16 0439  WBC 2.2* 2.3*  HGB 6.5* 8.0*  HCT 21.0* 25.6*  PLT 43* 33*  MCV 97.7 93.8  MCH 30.2 29.3  MCHC 31.0 31.3  RDW 17.8* 18.4*  LYMPHSABS 0.5* 0.6*  MONOABS 0.2 0.3  EOSABS 0.0 0.0  BASOSABS 0.0 0.0    Chemistries   Recent Labs Lab 12/15/16 1820 12/16/16 0834 12/16/16 1057  NA 141 139  --   K 3.2* 4.6  --   CL 100* 102  --   CO2 31 27  --   GLUCOSE 113* 81  --   BUN 13 22*  --   CREATININE 3.02* 4.26*  --   CALCIUM 8.3* 8.2*  --   MG  --  1.8  --   AST  --   --  26  ALT  --   --  15*  ALKPHOS  --   --  67  BILITOT  --   --  1.3*    ------------------------------------------------------------------------------------------------------------------ No results for input(s): CHOL, HDL, LDLCALC, TRIG, CHOLHDL, LDLDIRECT in the last 72 hours.  No results found for: HGBA1C ------------------------------------------------------------------------------------------------------------------ No results for input(s): TSH, T4TOTAL, T3FREE, THYROIDAB in the last 72 hours.  Invalid input(s): FREET3 ------------------------------------------------------------------------------------------------------------------  Recent Labs  12/16/16 1057  VITAMINB12 755  FOLATE 8.6  TIBC 176*  IRON 46  RETICCTPCT 2.4    Coagulation profile No results for input(s): INR, PROTIME in the last 168 hours.  No results for input(s): DDIMER in the last 72 hours.  Cardiac Enzymes No results for input(s): CKMB, TROPONINI, MYOGLOBIN in the last 168 hours.  Invalid input(s): CK ------------------------------------------------------------------------------------------------------------------    Component Value Date/Time   BNP 2,264.3 (H) 11/18/2015 [id number]    Inpatient Medications  Scheduled Meds: . sodium chloride   Intravenous Once  . allopurinol  100 mg Oral Daily  . carvedilol  12.5 mg Oral BID WC  . [START ON 12/18/2016] ceFEPime (MAXIPIME) IV  2 g Intravenous Q M,W,F-2000  . diltiazem  120 mg Oral Daily  . [START ON 12/18/2016] doxercalciferol  1 mcg Intravenous Q M,W,F-HD  . ipratropium-albuterol  3 mL Nebulization Q6H  . pantoprazole (PROTONIX) IV  40 mg Intravenous Q12H  . sodium chloride flush  3 mL Intravenous Q12H  . [START ON 12/18/2016] vancomycin  750 mg Intravenous Q M,W,F-HD   Continuous Infusions: PRN Meds:.sodium chloride, albuterol, sodium chloride flush  Micro Results Recent Results (from the past 240 hour(s))  Respiratory Panel by PCR     Status: None   Collection Time: 12/15/16  1:30 AM  Result Value Ref Range Status    Adenovirus NOT DETECTED NOT DETECTED Final   Coronavirus 229E NOT DETECTED NOT DETECTED Final   Coronavirus HKU1 NOT DETECTED NOT DETECTED Final   Coronavirus NL63 NOT DETECTED NOT DETECTED Final   Coronavirus OC43 NOT DETECTED NOT DETECTED Final   Metapneumovirus NOT DETECTED NOT DETECTED Final   Rhinovirus / Enterovirus NOT DETECTED NOT DETECTED Final   Influenza A NOT DETECTED NOT DETECTED Final   Influenza B NOT DETECTED NOT DETECTED Final   Parainfluenza Virus 1 NOT DETECTED NOT DETECTED Final   Parainfluenza Virus 2 NOT DETECTED NOT DETECTED Final   Parainfluenza Virus 3 NOT DETECTED NOT DETECTED Final   Parainfluenza Virus 4 NOT DETECTED NOT DETECTED Final   Respiratory Syncytial Virus NOT  DETECTED NOT DETECTED Final   Bordetella pertussis NOT DETECTED NOT DETECTED Final   Chlamydophila pneumoniae NOT DETECTED NOT DETECTED Final   Mycoplasma pneumoniae NOT DETECTED NOT DETECTED Final  Culture, blood (routine x 2) Call MD if unable to obtain prior to antibiotics being given     Status: None (Preliminary result)   Collection Time: 12/15/16  8:19 PM  Result Value Ref Range Status   Specimen Description BLOOD RIGHT ARM  Final   Special Requests BOTTLES DRAWN AEROBIC AND ANAEROBIC 5ML  Final   Culture NO GROWTH < 24 HOURS  Final   Report Status PENDING  Incomplete  Culture, blood (routine x 2) Call MD if unable to obtain prior to antibiotics being given     Status: None (Preliminary result)   Collection Time: 12/15/16  8:24 PM  Result Value Ref Range Status   Specimen Description BLOOD RIGHT HAND  Final   Special Requests IN PEDIATRIC BOTTLE 1ML  Final   Culture NO GROWTH < 24 HOURS  Final   Report Status PENDING  Incomplete  MRSA PCR Screening     Status: None   Collection Time: 12/16/16 12:33 AM  Result Value Ref Range Status   MRSA by PCR NEGATIVE NEGATIVE Final    Comment:        The GeneXpert MRSA Assay (FDA approved for NASAL specimens only), is one component of  a comprehensive MRSA colonization surveillance program. It is not intended to diagnose MRSA infection nor to guide or monitor treatment for MRSA infections.     Radiology Reports Dg Chest 2 View  Result Date: 12/15/2016 CLINICAL DATA:  Initial evaluation for acute cough, generalized weakness. EXAM: CHEST  2 VIEW COMPARISON:  Prior radiograph from 11/24/2016. FINDINGS: Cardiomegaly, stable from prior. Mediastinal silhouette within normal limits. Aortic atherosclerosis noted. Lungs mildly hypoinflated. Shunt catheter tubing overlies the thorax. Diffuse vascular congestion without overt pulmonary edema. Small right pleural effusion. Patchy right basilar opacity, suspicious for possible infiltrate given the provided history. No pneumothorax. No acute osseous abnormality. IMPRESSION: 1. Patchy right basilar opacity, suspicious for possible infiltrate given the provided history of cough. 2. Stable cardiomegaly with diffuse pulmonary vascular congestion without overt pulmonary edema. 3. Small right pleural effusion. 4. Aortic atherosclerosis. Electronically Signed   By: Jeannine Boga M.D.   On: 12/15/2016 18:41   Dg Chest 2 View  Result Date: 11/24/2016 CLINICAL DATA:  Two weeks of cough and other rep sperm tori symptoms. No definite fever. EXAM: CHEST  2 VIEW COMPARISON:  Portable chest x-ray of November 23, 2016 FINDINGS: The lungs are borderline hypoinflated. The interstitial markings are increased. The pulmonary vascularity is engorged. The cardiac silhouette is enlarged. There is a small right pleural effusion layering posteriorly. There is calcification in the wall of the thoracic aorta. A ventriculoperitoneal shunt tube is present. The observed bony thorax exhibits no acute abnormality. IMPRESSION: CHF with mild pulmonary interstitial edema. Small right pleural effusion. No definite pneumonia. Thoracic aortic atherosclerosis. Electronically Signed   By: David  Martinique M.D.   On: 11/24/2016  13:40   Dg Chest Port 1 View  Result Date: 11/23/2016 CLINICAL DATA:  Dyspnea, CHF, hypertension, stage IV chronic kidney disease EXAM: PORTABLE CHEST 1 VIEW COMPARISON:  Portable exam 1219 hours compared to 11/23/2016 at 0604 hours FINDINGS: VP shunt tubing traverses RIGHT hemithorax. Enlargement of cardiac silhouette with pulmonary vascular congestion. Stable mediastinal contours. Atherosclerotic calcification aorta. Small RIGHT pleural effusion with questionable tiny LEFT pleural effusion. Mild RIGHT basilar opacity, improved  from earlier study, question atelectasis or slightly improved edema. Remaining lungs clear. No pneumothorax. Bones demineralized. IMPRESSION: Enlargement of cardiac silhouette with pulmonary vascular congestion. Small RIGHT pleural effusion with slightly improved aeration since the earlier study question improved atelectasis versus edema. No Electronically Signed   By: Lavonia Dana M.D.   On: 11/23/2016 12:27   Dg Chest Portable 1 View  Result Date: 11/23/2016 CLINICAL DATA:  71 year old male with shortness of breath and cough. EXAM: PORTABLE CHEST 1 VIEW COMPARISON:  Chest radiograph dated 11/21/2015 FINDINGS: There is stable cardiomegaly. Diffuse interstitial prominence and nodularity may represent mild edema. Right lung base hazy density may represent atelectasis versus infiltrate. A small right pleural effusion noted. There is no pneumothorax. A shunt catheter is noted along the right hemithorax. No acute osseous pathology identified. IMPRESSION: Small right pleural effusion with right lung base atelectasis versus infiltrate. Stable cardiomegaly with possible mild interstitial edema. Electronically Signed   By: Anner Crete M.D.   On: 11/23/2016 06:18     Deira Shimer M.D on 12/16/2016 at 1:39 PM  Between 7am to 7pm - Pager - [phone]  After 7pm go to [email or web address] - password [email or web address]  [facility] -  Office  [phone]

## 2016-12-16 NOTE — Consult Note (Signed)
[facility] NOTE  Patient Care Team: [name], MD as PCP - General (Family Medicine) Consulting physician: Hospitalist CHIEF COMPLAINTS/PURPOSE OF CONSULTATION:  Severe anemia and thrombocytopenia  HISTORY OF PRESENTING ILLNESS:  Cameron Mendez 71 y.o. male is admitted to the hospital from skilled nursing facility with clinical signs and symptoms suggestive of pneumonia. He has multiple medical problems including end-stage kidney disease on hemodialysis, COPD, CHF, chronic anemia and chronic pancytopenia. He was found to have cough expectoration and was sent to the emergency room. Chest x-ray revealed patchy bilateral opacities and infiltrates suggestive of pneumonia. He is admitted to the hospital with broad-spectrum antibiotics for healthcare associated pneumonia. At the time of admission had a hemoglobin of 6.5 and a platelet count of 43. He was transfused 1 unit of platelets and 2 units of packed red cells. The hemoglobin improved to 8 but the platelets went down further to 33. He was transfused platelets because there was suspicion that he may be bleeding. Patient is generally fatigued and weak and is a poor historian. Most of the information is obtained from the charts.  MEDICAL HISTORY:  Past Medical History:  Diagnosis Date  . Anemia   . Arthritis    Gout  . Blind   . BPH (benign prostatic hyperplasia)   . Cardiac arrest ([place])   . CHF (congestive heart failure) ([place])   . CKD (chronic kidney disease), stage IV ([place])    a. L upper extremity AV fistula created 07/2012.  . Colon polyps   . COPD (chronic obstructive pulmonary disease) ([place])   . Diastolic dysfunction   . Gangrene of finger ([place])    a. 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  . GI (gastrointestinal bleed) feb 2016  . Hypertension    dx--"long time"  . Irregular heart rate 03/03/2015  . Pericardial effusion    a. Noted on echo 10/2009. b. Again seen on echo 09/2013.  [name] Retinitis  pigmentosa    Blindness--has shunt --placed yrs ago in [place]..    SURGICAL HISTORY: Past Surgical History:  Procedure Laterality Date  . AMPUTATION  12/30/2012   Procedure: AMPUTATION DIGIT;  Surgeon: Tennis Must, MD;  Location: [facility];  Service: Orthopedics;  Laterality: Left;  LEFT SMALL FINGER AMPUTATION  . AV FISTULA PLACEMENT  07/15/2012   Procedure: ARTERIOVENOUS (AV) FISTULA CREATION;  Surgeon: Rosetta Posner, MD;  Location: [facility];  Service: Vascular;  Laterality: Left;  . CATARACT EXTRACTION W/ INTRAOCULAR LENS  IMPLANT, BILATERAL Bilateral   . KNEE LIGAMENT RECONSTRUCTION Left   . PERICARDIAL TAP N/A 09/19/2013   Procedure: PERICARDIAL TAP;  Surgeon: Blane Ohara, MD;  [facility];  Service: Cardiovascular;  Laterality: N/A;  . PERICARDIOCENTESIS  09/2013    SOCIAL HISTORY: Social History   Social History  . Marital status: Married    Spouse name: N/A  . Number of children: 1  . Years of education: N/A   Occupational History  . unemployeed    Social History Main Topics  . Smoking status: Current Every Day Smoker    Packs/day: 0.50    Years: 50.00    Types: Cigarettes  . Smokeless tobacco: Never Used     Comment: Pt given handout on how to quit smoking 12/31/14  . Alcohol use No     Comment: "quit driinking in ~ 2000"  . Drug use: No  . Sexual activity: Not Currently   Other Topics Concern  . Not  on file   Social History Narrative  . No narrative on file    FAMILY HISTORY: Family History  Problem Relation Age of Onset  . Ovarian cancer Mother   . Colon polyps Brother   . Colon cancer Brother   . Kidney disease Neg Hx   . Gallbladder disease Neg Hx   . Esophageal cancer Neg Hx   . Heart disease Neg Hx   . Stomach cancer Neg Hx     ALLERGIES:  is allergic to ibuprofen and nsaids.  MEDICATIONS:  Current Facility-Administered Medications  Medication Dose Route Frequency Provider Last Rate Last Dose  . 0.9 %  sodium  chloride infusion  250 mL Intravenous PRN Ilene Qua Opyd, MD      . albuterol (PROVENTIL) (2.5 MG/3ML) 0.083% nebulizer solution 2.5 mg  2.5 mg Nebulization Q4H PRN Vianne Bulls, MD      . allopurinol (ZYLOPRIM) tablet 100 mg  100 mg Oral Daily Vianne Bulls, MD   100 mg at 12/16/16 1007  . carvedilol (COREG) tablet 12.5 mg  12.5 mg Oral BID WC Ilene Qua Opyd, MD   12.5 mg at 12/16/16 1007  . [START ON 12/18/2016] ceFEPIme (MAXIPIME) 2 g in dextrose 5 % 50 mL IVPB  2 g Intravenous Q M,W,F-2000 Lauren D Bajbus, RPH      . diltiazem (CARDIZEM CD) 24 hr capsule 120 mg  120 mg Oral Daily Vianne Bulls, MD   120 mg at 12/16/16 1007  . [START ON 12/18/2016] doxercalciferol (HECTOROL) injection 1 mcg  1 mcg Intravenous Q M,W,F-HD Timothy S Opyd, MD      . ipratropium-albuterol (DUONEB) 0.5-2.5 (3) MG/3ML nebulizer solution 3 mL  3 mL Nebulization Q6H Vianne Bulls, MD   3 mL at 12/16/16 0727  . pantoprazole (PROTONIX) injection 40 mg  40 mg Intravenous Q12H Vianne Bulls, MD   40 mg at 12/16/16 1008  . sodium chloride flush (NS) 0.9 % injection 3 mL  3 mL Intravenous Q12H Ilene Qua Opyd, MD   3 mL at 12/16/16 1000  . sodium chloride flush (NS) 0.9 % injection 3 mL  3 mL Intravenous PRN Vianne Bulls, MD      . Derrill Memo ON 12/18/2016] vancomycin (VANCOCIN) IVPB 750 mg/150 ml premix  750 mg Intravenous Q M,W,F-HD Lauren D Bajbus, RPH        REVIEW OF SYSTEMS:   Constitutional: Denies fevers, chills or abnormal night sweats Eyes: Blindness Respiratory: Cough and shortness of breath Cardiovascular: Intermittent palpitations Gastrointestinal:  Abdominal discomfort Skin: Denies abnormal skin rashes Lymphatics: Denies new lymphadenopathy or easy bruising Neurological: Peripheral neuropathy  All other systems were reviewed with the patient and are negative.  PHYSICAL EXAMINATION: ECOG PERFORMANCE STATUS: 4 - Bedbound  Vitals:   12/16/16 0521 12/16/16 0635  BP: (!) 143/38 (!) 132/40  Pulse: 80    Resp: 20   Temp:     Filed Weights   12/15/16 1710  Weight: 140 lb (63.5 kg)    GENERAL:Frail elderly gentleman SKIN: Extremely dry skin EYES: Blindness OROPHARYNX: Oral mucosa is dry  NECK: supple, thyroid normal size, non-tender, without nodularity LYMPH:  no palpable lymphadenopathy in the cervical, axillary or inguinal LUNGS: Bilateral basal crackles HEART: regular rate & rhythm  ABDOMEN:abdomen soft, non-tender and normal bowel sounds Musculoskeletal:no cyanosis of digits and no clubbing  NEURO: Peripheral neuropathy   LABORATORY DATA:  I have reviewed the data as listed Lab Results  Component Value Date  WBC 2.3 (L) 12/16/2016   HGB 8.0 (L) 12/16/2016   HCT 25.6 (L) 12/16/2016   MCV 93.8 12/16/2016   PLT 33 (L) 12/16/2016   Lab Results  Component Value Date   NA 139 12/16/2016   K 4.6 12/16/2016   CL 102 12/16/2016   CO2 27 12/16/2016    RADIOGRAPHIC STUDIES: I have personally reviewed the radiological reports and agreed with the findings in the report.  ASSESSMENT AND PLAN:  1. Severe anemia and thrombocytopenia Reticulocyte count 2.4% Absolute reticulocyte count: 67k Haptoglobin, LDH, iron studies 0000000 and folic acid are pending: Peripheral smear does not appear to show evidence of hemolysis Patient's anemia is normocytic and is likely multifactorial between him anemia of chronic kidney disease and anemia of acute illness/inflammation. For the anemia, transfuse as needed and erythropoietin as recommended during dialysis  2. severe thrombocytopenia: Multifactorial probably related to infection versus medication versus related to renal dysfunction I recommend transfusing platelets and obtaining a post-platelet count to assess the degree of response to platelet infusion. If the platelets do not go up, then the patient may need cross matched platelets. HIT antibody will also be sent.  3. HCAP: On cefepime and Vanco 4. ESKD     All questions were  answered. The patient knows to call the clinic with any problems, questions or concerns.    Cameron Eisenmenger, MD @T @

## 2016-12-16 NOTE — Progress Notes (Signed)
CRITICAL VALUE ALERT  Critical value received:  Lactic Acid=2.3  Date of notification:  12/16/2016  Time of notification:  10:20  Critical value read back:Yes.    Nurse who received alert:  Alphonsus Sias  MD notified (1st page):  Dr. [facility]  Time of first page:  10:21  MD notified (2nd page):  Time of second page:  Responding MD:  Dr. [facility]  Time MD responded:  10:21

## 2016-12-16 NOTE — Progress Notes (Signed)
Patient BP 132/40.  Paged the on call MD N. Eulas Post

## 2016-12-17 ENCOUNTER — Inpatient Hospital Stay (HOSPITAL_COMMUNITY): Payer: Medicare Other

## 2016-12-17 DIAGNOSIS — Z992 Dependence on renal dialysis: Secondary | ICD-10-CM

## 2016-12-17 DIAGNOSIS — N186 End stage renal disease: Secondary | ICD-10-CM

## 2016-12-17 LAB — PREPARE PLATELET PHERESIS
BLOOD PRODUCT EXPIRATION DATE: 201801082359
ISSUE DATE / TIME: 201801061434
UNIT TYPE AND RH: 6200

## 2016-12-17 LAB — CBC
HEMATOCRIT: 25.6 % — AB (ref 39.0–52.0)
Hemoglobin: 8 g/dL — ABNORMAL LOW (ref 13.0–17.0)
MCH: 30.3 pg (ref 26.0–34.0)
MCHC: 31.3 g/dL (ref 30.0–36.0)
MCV: 97 fL (ref 78.0–100.0)
Platelets: 55 10*3/uL — ABNORMAL LOW (ref 150–400)
RBC: 2.64 MIL/uL — AB (ref 4.22–5.81)
RDW: 18.9 % — ABNORMAL HIGH (ref 11.5–15.5)
WBC: 2.8 10*3/uL — AB (ref 4.0–10.5)

## 2016-12-17 LAB — RENAL FUNCTION PANEL
ANION GAP: 10 (ref 5–15)
Albumin: 3.1 g/dL — ABNORMAL LOW (ref 3.5–5.0)
BUN: 33 mg/dL — ABNORMAL HIGH (ref 6–20)
CHLORIDE: 101 mmol/L (ref 101–111)
CO2: 28 mmol/L (ref 22–32)
CREATININE: 6.1 mg/dL — AB (ref 0.61–1.24)
Calcium: 8.7 mg/dL — ABNORMAL LOW (ref 8.9–10.3)
GFR calc Af Amer: 10 mL/min — ABNORMAL LOW (ref >60)
GFR, EST NON AFRICAN AMERICAN: 8 mL/min — AB (ref >60)
Glucose, Bld: 93 mg/dL (ref 65–99)
POTASSIUM: 4.8 mmol/L (ref 3.5–5.1)
Phosphorus: 3.7 mg/dL (ref 2.5–4.6)
Sodium: 139 mmol/L (ref 135–145)

## 2016-12-17 LAB — HIV ANTIBODY (ROUTINE TESTING W REFLEX): HIV Screen 4th Generation wRfx: NONREACTIVE

## 2016-12-17 LAB — HAPTOGLOBIN: Haptoglobin: <10 mg/dL — ABNORMAL LOW (ref 34–200)

## 2016-12-17 LAB — LIPASE, BLOOD: LIPASE: 26 U/L (ref 11–51)

## 2016-12-17 LAB — HEPARIN INDUCED PLATELET AB (HIT ANTIBODY): Heparin Induced Plt Ab: 0.222 OD (ref 0.000–0.400)

## 2016-12-17 MED ORDER — SODIUM CHLORIDE 0.9 % IV SOLN
62.5000 mg | INTRAVENOUS | Status: DC
  Filled 2016-12-17: qty 5

## 2016-12-17 MED ORDER — DOXERCALCIFEROL 4 MCG/2ML IV SOLN
3.0000 ug | INTRAVENOUS | Status: DC
  Administered 2016-12-20: 3 ug via INTRAVENOUS

## 2016-12-17 MED ORDER — DARBEPOETIN ALFA 200 MCG/0.4ML IJ SOSY: 200.0000 ug | PREFILLED_SYRINGE | INTRAMUSCULAR | Status: DC

## 2016-12-17 MED ORDER — IOPAMIDOL (ISOVUE-300) INJECTION 61%
INTRAVENOUS | Status: AC
  Administered 2016-12-17: 30 mL
  Filled 2016-12-17: qty 30

## 2016-12-17 MED ORDER — SODIUM CHLORIDE 0.9 % IV SOLN
125.0000 mg | INTRAVENOUS | Status: DC
  Administered 2016-12-20: 125 mg via INTRAVENOUS
  Filled 2016-12-17 (×2): qty 10

## 2016-12-17 MED ORDER — MORPHINE SULFATE (PF) 2 MG/ML IV SOLN
1.0000 mg | Freq: Once | INTRAVENOUS | Status: AC
  Administered 2016-12-17: 1 mg via INTRAVENOUS
  Filled 2016-12-17: qty 1

## 2016-12-17 MED ORDER — RESOURCE THICKENUP CLEAR PO POWD
ORAL | Status: DC | PRN
  Filled 2016-12-17: qty 125

## 2016-12-17 NOTE — Progress Notes (Signed)
Patient has a poor appetite today; he refused to eat. Family and MD aware. Will continue to monitor.

## 2016-12-17 NOTE — Progress Notes (Signed)
PROGRESS NOTE                                                                                                                                                                                                             Patient Demographics:    Shrish State, is a 71 y.o. male, DOB - 07-13-1946, [id number]  Admit date - 12/15/2016   Admitting Physician Vianne Bulls, MD  Outpatient Primary MD for the patient is [name], MD  LOS - 2  Chief Complaint  Patient presents with  . Abnormal Lab       Brief Narrative   71 y.o. male with medical history significant for end-stage renal disease on hemodialysis, COPD, chronic diastolic CHF, paroxysmal atrial fibrillation, chronic thrombocytopenia, and anemia who presents to the emergency department from his dialysis center for evaluation of generalized weakness, confusion, cough, and acute on chronic anemia  Subjective:    [name] today has, No headache, No chest pain, No abdominal pain - No Nausea, No evidence of GI bleed, had Couple bowel movements since admission with no blood.  Assessment  & Plan :    Principal Problem:   Symptomatic anemia Active Problems:   Essential hypertension, benign   Thrombocytopenia (HCC)   ESRD on hemodialysis (HCC)   COPD exacerbation (HCC)   Leukopenia   Chronic diastolic CHF (congestive heart failure) (HCC)   Paroxysmal atrial fibrillation (HCC)   HCAP (healthcare-associated pneumonia)   GI bleed   Hypokalemia  Symptomatic anemia, FOBT +  - Hemoglobin 6.5 on admission, baseline in 7-8 range. - Hemoccult positive stool on admission, but no evidence of gross bleeding,a few bowel movement since admission, no evidence of bleed  but  FOBT+ - EGD in March 2016 with chronic gastritis, duodenal bulb inflammation, polyp in cardia  - Colonoscopy March 2016 with mild ascending colon diverticulosis and moderate sigmoid diverticulosis, multiple polyps snared  - Will  treat with Protonix 40 mg IV q12h,  hold his ASA 81 , 1 H&H and transfuse as needed, received 1 unit PRBC 1/5 with good response, hemoglobin is 8 this a.m.[name] - ESRD anemia/iron deficiency anemia, on erythropoietin, iron per renal team  HCAP  - Pt presents with 2-3 days of cough, dyspnea, wheezing - Rhonchi on exam, CXR with right basilar infiltrate, no hypoxia  - Continue empiric treatment  with vancomycin and cefepime initially given residence in SNF, HD, and recent hospitalization  , will discontinue vancomycin  COPD with acute exacerbation  - Likely precipitated by the PNA  - Treating with abx as above  - Hold Spiriva and treat with scheduled DuoNeb  - Will try to avoid steroids given concern for acute GIB   ESRD   - Pt reports completing HD without incident just prior to admission on 12/16/15  - He will receive fluids with blood products and abx  - He reports still making urine; will follow strict I/Os, consider Lasix between or after transfusions as needed    thrombocytopenia  - Has chronic, cytopenia , oncology on input greatly appreciated, no evidence of hemolysis , so far transfused 2 units 8 lit since admission , platelet stable at 50 5K today, no indication for further transfusion . - Follow on H IT  Chronic diastolic CHF  - Volume management with hemodialysis  Atrial fibrillation, paroxysmal   - In a sinus rhythm on admission   - CHADS-VASc 4 (age, CHF, HTN, CAD) - He is not anticoagulated due to chronic thrombocytopenia with GIBs  - Continue diltiazem as tolerated    Hypertension  - BP soft but stable in ED  - Continue Coreg with holding parameters    Code Status : Full  Family Communication  : None at bedside  Disposition Plan  : pending further workup.  Consults  :  Renal, oncology  Procedures  : none  DVT Prophylaxis  :  SCD  Lab Results  Component Value Date   PLT 55 (L) 12/17/2016    Antibiotics  :    Anti-infectives    Start     Dose/Rate  Route Frequency Ordered Stop   12/18/16 2000  ceFEPIme (MAXIPIME) 2 g in dextrose 5 % 50 mL IVPB     2 g 100 mL/hr over 30 Minutes Intravenous Every M-W-F (2000) 12/15/16 2026     12/18/16 1200  vancomycin (VANCOCIN) IVPB 750 mg/150 ml premix  Status:  Discontinued     750 mg 150 mL/hr over 60 Minutes Intravenous Every M-W-F (Hemodialysis) 12/15/16 1954 12/17/16 1220   12/15/16 2000  vancomycin (VANCOCIN) 1,500 mg in sodium chloride 0.9 % 500 mL IVPB     1,500 mg 250 mL/hr over 120 Minutes Intravenous  Once 12/15/16 1954 12/16/16 0413   12/15/16 1945  ceFEPIme (MAXIPIME) 2 g in dextrose 5 % 50 mL IVPB     2 g 100 mL/hr over 30 Minutes Intravenous  Once 12/15/16 1940 12/15/16 2053        Objective:   Vitals:   12/17/16 0616 12/17/16 0653 12/17/16 0825 12/17/16 0944  BP: (!) 190/56 (!) 178/54 (!) 132/47   Pulse: 81 76    Resp: 20     Temp: 97.9 F (36.6 C)     TempSrc: Oral     SpO2: 94%  91% 93%  Weight:      Height:        Wt Readings from Last 3 Encounters:  12/15/16 63.5 kg (140 lb)  12/08/16 63 kg (139 lb)  11/30/16 63 kg (139 lb)     Intake/Output Summary (Last 24 hours) at 12/17/16 1413 Last data filed at 12/17/16 0923  Gross per 24 hour  Intake              715 ml  Output              150 ml  Net  565 ml     Physical Exam  Awake Alert,  Legally blind Supple Neck,No JVD,  Symmetrical Chest wall movement, Good air movement bilaterally, RRR,No Gallops,Rubs or new Murmurs, No Parasternal Heave +ve B.Sounds, Abd Soft, No tenderness,  No rebound - guarding or rigidity. No Cyanosis, Clubbing or edema, No new Rash or bruise      Data Review:    CBC  Recent Labs Lab 12/15/16 1820 12/16/16 0439 12/16/16 1819 12/17/16 0544  WBC 2.2* 2.3* 2.9* 2.8*  HGB 6.5* 8.0* 7.9* 8.0*  HCT 21.0* 25.6* 25.5* 25.6*  PLT 43* 33* 64* 55*  MCV 97.7 93.8 95.9 97.0  MCH 30.2 29.3 29.7 30.3  MCHC 31.0 31.3 31.0 31.3  RDW 17.8* 18.4* 19.0* 18.9*    LYMPHSABS 0.5* 0.6*  --   --   MONOABS 0.2 0.3  --   --   EOSABS 0.0 0.0  --   --   BASOSABS 0.0 0.0  --   --     Chemistries   Recent Labs Lab 12/15/16 1820 12/16/16 0834 12/16/16 1057 12/17/16 0544  NA 141 139  --  139  K 3.2* 4.6  --  4.8  CL 100* 102  --  101  CO2 31 27  --  28  GLUCOSE 113* 81  --  93  BUN 13 22*  --  33*  CREATININE 3.02* 4.26*  --  6.10*  CALCIUM 8.3* 8.2*  --  8.7*  MG  --  1.8  --   --   AST  --   --  26  --   ALT  --   --  15*  --   ALKPHOS  --   --  67  --   BILITOT  --   --  1.3*  --    ------------------------------------------------------------------------------------------------------------------ No results for input(s): CHOL, HDL, LDLCALC, TRIG, CHOLHDL, LDLDIRECT in the last 72 hours.  No results found for: HGBA1C ------------------------------------------------------------------------------------------------------------------ No results for input(s): TSH, T4TOTAL, T3FREE, THYROIDAB in the last 72 hours.  Invalid input(s): FREET3 ------------------------------------------------------------------------------------------------------------------  Recent Labs  12/16/16 1057  VITAMINB12 755  FOLATE 8.6  TIBC 176*  IRON 46  RETICCTPCT 2.4    Coagulation profile No results for input(s): INR, PROTIME in the last 168 hours.  No results for input(s): DDIMER in the last 72 hours.  Cardiac Enzymes No results for input(s): CKMB, TROPONINI, MYOGLOBIN in the last 168 hours.  Invalid input(s): CK ------------------------------------------------------------------------------------------------------------------    Component Value Date/Time   BNP 2,264.3 (H) 11/18/2015 [id number]    Inpatient Medications  Scheduled Meds: . allopurinol  100 mg Oral Daily  . carvedilol  12.5 mg Oral BID WC  . [START ON 12/18/2016] ceFEPime (MAXIPIME) IV  2 g Intravenous Q M,W,F-2000  . [START ON 12/18/2016] darbepoetin (ARANESP) injection - DIALYSIS  200 mcg  Intravenous Q Mon-HD  . diltiazem  120 mg Oral Daily  . [START ON 12/18/2016] doxercalciferol  3 mcg Intravenous Q M,W,F-HD  . [START ON 12/18/2016] ferric gluconate (FERRLECIT/NULECIT) IV  125 mg Intravenous Q M,W,F-HD  . [START ON 12/27/2016] ferric gluconate (FERRLECIT/NULECIT) IV  62.5 mg Intravenous Q Wed-HD  . guaiFENesin  1,200 mg Oral BID  . ipratropium-albuterol  3 mL Nebulization Q6H  . pantoprazole (PROTONIX) IV  40 mg Intravenous Q12H  . sodium chloride flush  3 mL Intravenous Q12H   Continuous Infusions: PRN Meds:.sodium chloride, acetaminophen, albuterol, RESOURCE THICKENUP CLEAR, sodium chloride flush  Micro Results Recent Results (from the past 240  hour(s))  Respiratory Panel by PCR     Status: None   Collection Time: 12/15/16  1:30 AM  Result Value Ref Range Status   Adenovirus NOT DETECTED NOT DETECTED Final   Coronavirus 229E NOT DETECTED NOT DETECTED Final   Coronavirus HKU1 NOT DETECTED NOT DETECTED Final   Coronavirus NL63 NOT DETECTED NOT DETECTED Final   Coronavirus OC43 NOT DETECTED NOT DETECTED Final   Metapneumovirus NOT DETECTED NOT DETECTED Final   Rhinovirus / Enterovirus NOT DETECTED NOT DETECTED Final   Influenza A NOT DETECTED NOT DETECTED Final   Influenza B NOT DETECTED NOT DETECTED Final   Parainfluenza Virus 1 NOT DETECTED NOT DETECTED Final   Parainfluenza Virus 2 NOT DETECTED NOT DETECTED Final   Parainfluenza Virus 3 NOT DETECTED NOT DETECTED Final   Parainfluenza Virus 4 NOT DETECTED NOT DETECTED Final   Respiratory Syncytial Virus NOT DETECTED NOT DETECTED Final   Bordetella pertussis NOT DETECTED NOT DETECTED Final   Chlamydophila pneumoniae NOT DETECTED NOT DETECTED Final   Mycoplasma pneumoniae NOT DETECTED NOT DETECTED Final  Culture, blood (routine x 2) Call MD if unable to obtain prior to antibiotics being given     Status: None (Preliminary result)   Collection Time: 12/15/16  8:19 PM  Result Value Ref Range Status   Specimen  Description BLOOD RIGHT ARM  Final   Special Requests BOTTLES DRAWN AEROBIC AND ANAEROBIC 5ML  Final   Culture NO GROWTH < 24 HOURS  Final   Report Status PENDING  Incomplete  Culture, blood (routine x 2) Call MD if unable to obtain prior to antibiotics being given     Status: None (Preliminary result)   Collection Time: 12/15/16  8:24 PM  Result Value Ref Range Status   Specimen Description BLOOD RIGHT HAND  Final   Special Requests IN PEDIATRIC BOTTLE 1ML  Final   Culture NO GROWTH < 24 HOURS  Final   Report Status PENDING  Incomplete  MRSA PCR Screening     Status: None   Collection Time: 12/16/16 12:33 AM  Result Value Ref Range Status   MRSA by PCR NEGATIVE NEGATIVE Final    Comment:        The GeneXpert MRSA Assay (FDA approved for NASAL specimens only), is one component of a comprehensive MRSA colonization surveillance program. It is not intended to diagnose MRSA infection nor to guide or monitor treatment for MRSA infections.     Radiology Reports Ct Abdomen Pelvis Wo Contrast  Result Date: 12/17/2016 CLINICAL DATA:  Abdominal pain and fatigue EXAM: CT ABDOMEN AND PELVIS WITHOUT CONTRAST TECHNIQUE: Multidetector CT imaging of the abdomen and pelvis was performed following the standard protocol without IV contrast. COMPARISON:  12/16/2016 FINDINGS: Lower chest: Small bilateral pleural effusions are noted. Bilateral lower lobe consolidation is noted right greater than left. Hepatobiliary: The liver is within normal limits. The gallbladder is well distended with dependent gallstones. No biliary obstructive changes are seen. Pancreas: Unremarkable. No pancreatic ductal dilatation or surrounding inflammatory changes. Spleen: Spleen is enlarged when compared with the prior exam and demonstrates at least 2 hypodense lesions which are poorly characterized on this noncontrast study. These were not present on the prior exam Adrenals/Urinary Tract: The adrenal glands are within normal  limits bilaterally. Bilateral nonobstructing renal stones are seen. Diffuse renal vascular calcifications are noted. Scattered cystic lesions are noted in both kidneys. No obstructive changes are seen. The bladder is decompressed. Stomach/Bowel: The appendix is well visualized. No inflammatory changes are seen. Diverticular  change of the colon is noted without diverticulitis. Vascular/Lymphatic: Heavy atherosclerotic calcifications of the aorta are noted without aneurysmal dilatation. No significant lymphatic abnormality is seen. Reproductive: Prostate is unremarkable. Other: 2 catheters extending into the peritoneal cavity are noted consistent with ventriculoperitoneal shunts. Some mild free fluid is noted within the abdomen likely related to these catheters. Musculoskeletal: Degenerative changes of the lumbar spine are seen. No acute bony abnormality is noted. IMPRESSION: Hypodense lesions within the spleen which are incompletely characterized on this exam. Nonemergent outpatient MRI evaluation is recommended when the patient's condition improves to allow for optimum characterization. Bilateral pleural effusions and bibasilar consolidation. Mild free fluid within the abdomen related to ventricular shunt catheters. Nonobstructing renal calculi bilaterally. Electronically Signed   By: Inez Catalina M.D.   On: 12/17/2016 11:25   Dg Chest 2 View  Result Date: 12/15/2016 CLINICAL DATA:  Initial evaluation for acute cough, generalized weakness. EXAM: CHEST  2 VIEW COMPARISON:  Prior radiograph from 11/24/2016. FINDINGS: Cardiomegaly, stable from prior. Mediastinal silhouette within normal limits. Aortic atherosclerosis noted. Lungs mildly hypoinflated. Shunt catheter tubing overlies the thorax. Diffuse vascular congestion without overt pulmonary edema. Small right pleural effusion. Patchy right basilar opacity, suspicious for possible infiltrate given the provided history. No pneumothorax. No acute osseous  abnormality. IMPRESSION: 1. Patchy right basilar opacity, suspicious for possible infiltrate given the provided history of cough. 2. Stable cardiomegaly with diffuse pulmonary vascular congestion without overt pulmonary edema. 3. Small right pleural effusion. 4. Aortic atherosclerosis. Electronically Signed   By: Jeannine Boga M.D.   On: 12/15/2016 18:41   Dg Chest 2 View  Result Date: 11/24/2016 CLINICAL DATA:  Two weeks of cough and other rep sperm tori symptoms. No definite fever. EXAM: CHEST  2 VIEW COMPARISON:  Portable chest x-ray of November 23, 2016 FINDINGS: The lungs are borderline hypoinflated. The interstitial markings are increased. The pulmonary vascularity is engorged. The cardiac silhouette is enlarged. There is a small right pleural effusion layering posteriorly. There is calcification in the wall of the thoracic aorta. A ventriculoperitoneal shunt tube is present. The observed bony thorax exhibits no acute abnormality. IMPRESSION: CHF with mild pulmonary interstitial edema. Small right pleural effusion. No definite pneumonia. Thoracic aortic atherosclerosis. Electronically Signed   By: David  Martinique M.D.   On: 11/24/2016 13:40   Dg Chest Port 1 View  Result Date: 11/23/2016 CLINICAL DATA:  Dyspnea, CHF, hypertension, stage IV chronic kidney disease EXAM: PORTABLE CHEST 1 VIEW COMPARISON:  Portable exam 1219 hours compared to 11/23/2016 at 0604 hours FINDINGS: VP shunt tubing traverses RIGHT hemithorax. Enlargement of cardiac silhouette with pulmonary vascular congestion. Stable mediastinal contours. Atherosclerotic calcification aorta. Small RIGHT pleural effusion with questionable tiny LEFT pleural effusion. Mild RIGHT basilar opacity, improved from earlier study, question atelectasis or slightly improved edema. Remaining lungs clear. No pneumothorax. Bones demineralized. IMPRESSION: Enlargement of cardiac silhouette with pulmonary vascular congestion. Small RIGHT pleural effusion  with slightly improved aeration since the earlier study question improved atelectasis versus edema. No Electronically Signed   By: Lavonia Dana M.D.   On: 11/23/2016 12:27   Dg Chest Portable 1 View  Result Date: 11/23/2016 CLINICAL DATA:  71 year old male with shortness of breath and cough. EXAM: PORTABLE CHEST 1 VIEW COMPARISON:  Chest radiograph dated 11/21/2015 FINDINGS: There is stable cardiomegaly. Diffuse interstitial prominence and nodularity may represent mild edema. Right lung base hazy density may represent atelectasis versus infiltrate. A small right pleural effusion noted. There is no pneumothorax. A shunt catheter is noted  along the right hemithorax. No acute osseous pathology identified. IMPRESSION: Small right pleural effusion with right lung base atelectasis versus infiltrate. Stable cardiomegaly with possible mild interstitial edema. Electronically Signed   By: Anner Crete M.D.   On: 11/23/2016 06:18   Dg Abd Acute W/chest  Result Date: 12/17/2016 CLINICAL DATA:  Abdominal pain today EXAM: DG ABDOMEN ACUTE W/ 1V CHEST COMPARISON:  12/15/2016 FINDINGS: The abdominal gas pattern is normal. Ventriculoperitoneal shunt tubing projects over the chest and abdomen. There are calcifications along the expected location of the pancreas which likely relate to chronic pancreatitis. The upright view of the chest demonstrates a small right pleural effusion and mild linear basilar lung opacities, as well as moderate vascular and interstitial congestive changes. Unchanged cardiomegaly. IMPRESSION: 1. No evidence of bowel obstruction or perforation. 2. Question pancreatic calcifications, suggesting chronic pancreatitis. 3. Vascular and interstitial prominence, cardiomegaly, and small right pleural effusion. This may represent mild congestive heart failure. Electronically Signed   By: Andreas Newport M.D.   On: 12/17/2016 00:15     Lujuana Kapler M.D on 12/17/2016 at 2:13 PM  Between 7am to 7pm -  Pager - [phone]  After 7pm go to [email or web address] - password [facility]  [facility] -  Office  [phone]

## 2016-12-17 NOTE — Progress Notes (Signed)
MD made aware about patient's BP. Will continue to monitor.

## 2016-12-17 NOTE — Evaluation (Signed)
Clinical/Bedside Swallow Evaluation Patient Details  Name: Cameron Mendez MRN: [id number] Date of Birth: [birth year]  Today's Date: 12/17/2016 Time: SLP Start Time (ACUTE ONLY): 15 SLP Stop Time (ACUTE ONLY): 1130 SLP Time Calculation (min) (ACUTE ONLY): 27 min  Past Medical History:  Past Medical History:  Diagnosis Date  . Anemia   . Arthritis    Gout  . Blind   . BPH (benign prostatic hyperplasia)   . Cardiac arrest ([place])   . CHF (congestive heart failure) ([place])   . CKD (chronic kidney disease), stage IV ([place])    a. L upper extremity AV fistula created 07/2012.  . Colon polyps   . COPD (chronic obstructive pulmonary disease) ([place])   . Diastolic dysfunction   . Gangrene of finger ([place])    a. 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  . GI (gastrointestinal bleed) feb 2016  . Hypertension    dx--"long time"  . Irregular heart rate 03/03/2015  . Pericardial effusion    a. Noted on echo 10/2009. b. Again seen on echo 09/2013.  [name] Retinitis pigmentosa    Blindness--has shunt --placed yrs ago in [place]..   Past Surgical History:  Past Surgical History:  Procedure Laterality Date  . AMPUTATION  12/30/2012   Procedure: AMPUTATION DIGIT;  Surgeon: Tennis Must, MD;  Location: [facility];  Service: Orthopedics;  Laterality: Left;  LEFT SMALL FINGER AMPUTATION  . AV FISTULA PLACEMENT  07/15/2012   Procedure: ARTERIOVENOUS (AV) FISTULA CREATION;  Surgeon: Rosetta Posner, MD;  Location: [facility];  Service: Vascular;  Laterality: Left;  . CATARACT EXTRACTION W/ INTRAOCULAR LENS  IMPLANT, BILATERAL Bilateral   . KNEE LIGAMENT RECONSTRUCTION Left   . PERICARDIAL TAP N/A 09/19/2013   Procedure: PERICARDIAL TAP;  Surgeon: Blane Ohara, MD;  [facility];  Service: Cardiovascular;  Laterality: N/A;  . PERICARDIOCENTESIS  09/2013   HPI:  Cameron L Smithis a 71 y.o.malewith medical history significant for end-stage renal disease on hemodialysis, COPD, chronic  diastolic CHF, paroxysmal atrial fibrillation, chronic thrombocytopenia, and anemia who presents to the emergency department from his dialysis center for evaluation of generalized weakness, confusion, cough, and acute on chronic anemia. Patient reports that he had been in his usual state of health until approximately 2-3 days ago when he developed shortness of breath and cough. Since that time, the cough and dyspnea have worsened, and had become accompanied by generalized weakness and fatigue. Patient denies any fevers or chills and denies any chest pain or palpitations. Patient denies any nausea, vomiting, or diarrhea. He is blind and unable to comment on the presence of melena or hematochezia. Basic lab work was obtained at the dialysis center today and his hemoglobin was reportedly low at 7.3, down 2 g from a recent prior. He was directed to the emergency department for further evaluation of this. He was reportedly confused upon arrival for low of EMS, but is fully oriented in the ED. Of note, the patient had MBS dated 09/18/2015 with recommendation for a dysphagia 2 diet with honey thick liquids.  The patient was unable to elaborate on any ST treatment or recommendation for his diet to be advanced.  Most recent chest xray is showing patchy right basilar opacity ? infiltrate as well as small right pleural effusion.     Assessment / Plan / Recommendation Clinical Impression  Clinical swallowing evaluation was completed using thin liquids, honey thick liquids, pureed material and dual textured solids.  Oral mechanism exam  was unable to be completed as the patient was unable to follow commands.  The patient did present with an open mouth posture with tongue protrusion at baseline.  Of note, the patient had an MBS dated 09/18/2015 with recommendation for a dysphagia 2 diet with honey thick liquids.  The patient was unable to answer any questions regarding possible diet upgrade or if he recieved any ST services  after that study.  Today the patient presented with oropharyngeal dysphagia characterized by delayed oral transit and delayed swallow trigger.  Overt s/s of aspiration were seen given honey thick liquids via cup sips following dual textured material.  S/S were not seen given honey thick liquids following pureed material.  Given current clinical presentation and most recent chest xray showing possible RLL infiltrate recommend downgrade diet to Dysphagia 1 with honey thick liquids pending results of repeat MBS next date.      Aspiration Risk  Moderate aspiration risk    Diet Recommendation   Dysphagia 1 with honey thick liquids  Medication Administration: Crushed with puree    Other  Recommendations Oral Care Recommendations: Oral care BID Other Recommendations: Order thickener from pharmacy;Prohibited food (jello, ice cream, thin soups);Have oral suction available   Follow up Recommendations  (TBD)             Prognosis Prognosis for Safe Diet Advancement: [place] Study   General Date of Onset: 12/15/16 HPI: Cameron Argenti Smithis a 71 y.o.malewith medical history significant for end-stage renal disease on hemodialysis, COPD, chronic diastolic CHF, paroxysmal atrial fibrillation, chronic thrombocytopenia, and anemia who presents to the emergency department from his dialysis center for evaluation of generalized weakness, confusion, cough, and acute on chronic anemia. Patient reports that he had been in his usual state of health until approximately 2-3 days ago when he developed shortness of breath and cough. Since that time, the cough and dyspnea have worsened, and had become accompanied by generalized weakness and fatigue. Patient denies any fevers or chills and denies any chest pain or palpitations. Patient denies any nausea, vomiting, or diarrhea. He is blind and unable to comment on the presence of melena or hematochezia. Basic lab work was obtained at the dialysis center today and  his hemoglobin was reportedly low at 7.3, down 2 g from a recent prior. He was directed to the emergency department for further evaluation of this. He was reportedly confused upon arrival for low of EMS, but is fully oriented in the ED. Of note, the patient had MBS dated 09/18/2015 with recommendation for a dysphagia 2 diet with honey thick liquids.  The patient was unable to elaborate on any ST treatment or recommendation for his diet to be advanced.  Most recent chest xray is showing patchy right basilar opacity ? infiltrate as well as small right pleural effusion.   Type of Study: Bedside Swallow Evaluation Previous Swallow Assessment: 09/18/2015 with Rx for Dysphagia 2 with honey thick liquids.   Diet Prior to this Study: Regular;Thin liquids Temperature Spikes Noted: No Respiratory Status: Room air History of Recent Intubation: No Behavior/Cognition: Lethargic/Drowsy;Doesn't follow directions Oral Cavity Assessment: Dried secretions Oral Care Completed by SLP: No Self-Feeding Abilities: Total assist Patient Positioning: Upright in bed Baseline Vocal Quality: Normal Volitional Cough: Cognitively unable to elicit Volitional Swallow: Unable to elicit    Oral/Motor/Sensory Function Overall Oral Motor/Sensory Function:  (Unable to assess.)   Ice Chips Ice chips: Not tested   Thin Liquid Thin Liquid: Impaired Presentation: Cup;Spoon Oral  Phase Impairments: Impaired mastication Oral Phase Functional Implications: Prolonged oral transit Pharyngeal  Phase Impairments: Suspected delayed Swallow;Cough - Delayed    Nectar Thick Nectar Thick Liquid: Not tested   Honey Thick Honey Thick Liquid: Impaired Presentation: Cup Oral Phase Impairments: Impaired mastication Oral Phase Functional Implications: Prolonged oral transit Pharyngeal Phase Impairments: Suspected delayed Swallow;Cough - Delayed (following dual textured solids )   Puree Puree: Impaired Presentation: Spoon Oral Phase  Impairments: Impaired mastication Oral Phase Functional Implications: Prolonged oral transit Pharyngeal Phase Impairments: Suspected delayed Swallow   Solid   GO   Solid: Impaired Presentation: Spoon Oral Phase Impairments: Impaired mastication Oral Phase Functional Implications: Prolonged oral transit Pharyngeal Phase Impairments: Suspected delayed Swallow         Shelly Flatten, MA, CCC-SLP Acute Rehab SLP [id number] Shelly Flatten N 12/17/2016,11:43 AM

## 2016-12-17 NOTE — Progress Notes (Signed)
HEMATOLOGY-ONCOLOGY PROGRESS NOTE  SUBJECTIVE: Follow-up of severe anemia and some cytopenia. Patient is very somnolent and was not answering any questions.  OBJECTIVE: ill appearing male REVIEW OF SYSTEMS:   Not obtainable since the patient is drowsy and not answering questions  PHYSICAL EXAMINATION: ECOG PERFORMANCE STATUS: 4 - Bedbound  Vitals:   12/17/16 0653 12/17/16 0825  BP: (!) 178/54 (!) 132/47  Pulse: 76   Resp:    Temp:     Filed Weights   12/15/16 1710  Weight: 140 lb (63.5 kg)    GENERAL:Drowsy SKIN: skin color, texture, turgor are normal, no rashes or significant lesions EYES: normal, Conjunctiva are pink and non-injected, sclera clear OROPHARYNX: Patient has is tongue out and appears to be breathing with his mouth  NECK: supple, thyroid normal size, non-tender, without nodularity LUNGS: clear to auscultation and decreased breath sounds at the bases HEART: regular rate & rhythm and no murmurs and no lower extremity edema ABDOMEN:abdomen soft, non-tender NEURO: Not tested because the patient does not follow commands  LABORATORY DATA:  I have reviewed the data as listed CMP Latest Ref Rng & Units 12/17/2016 12/16/2016 12/15/2016  Glucose 65 - 99 mg/dL 93 81 113(H)  BUN 6 - 20 mg/dL 33(H) 22(H) 13  Creatinine 0.61 - 1.24 mg/dL 6.10(H) 4.26(H) 3.02(H)  Sodium 135 - 145 mmol/L 139 139 141  Potassium 3.5 - 5.1 mmol/L 4.8 4.6 3.2(L)  Chloride 101 - 111 mmol/L 101 102 100(L)  CO2 22 - 32 mmol/L 28 27 31   Calcium 8.9 - 10.3 mg/dL 8.7(L) 8.2(L) 8.3(L)  Total Protein 6.5 - 8.1 g/dL - 6.5 -  Total Bilirubin 0.3 - 1.2 mg/dL - 1.3(H) -  Alkaline Phos 38 - 126 U/L - 67 -  AST 15 - 41 U/L - 26 -  ALT 17 - 63 U/L - 15(L) -    Lab Results  Component Value Date   WBC 2.8 (L) 12/17/2016   HGB 8.0 (L) 12/17/2016   HCT 25.6 (L) 12/17/2016   MCV 97.0 12/17/2016   PLT 55 (L) 12/17/2016   NEUTROABS 1.5 (L) 12/16/2016    ASSESSMENT AND PLAN: 1. Severe anemia: Due to  chronic kidney disease and due to chronic inflammation/infection. Hemoglobin is stable at 8 g. Continue supportive care with blood transfusions as needed. 2. thrombocytopenia: Patient didn't respond to platelet transfusion. His platelets went from 33-64 but they are down to 55. Continue supportive care. 3. End-stage kidney disease on hemodialysis I did not find any family to discuss but his overall prognosis appears to be fairly poor.  At this point hematology will sign off. Please call if there are any additional concerns. Thank you

## 2016-12-17 NOTE — Consult Note (Signed)
[facility] Renal Consultation Note  Indication for Consultation:  Management of ESRD/hemodialysis; anemia, hypertension/volume and secondary hyperparathyroidism  HPI: Cameron Mendez is a 71 y.o. 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 Noted has NO HEPARIN HD sec low plts(chronic thrombocytopenia  46-80K ) and hx GIB, COPD,3/8[facility] endoscopy- duodenal inflammation. chronic gastritis in gastric body- biopsies performed, sessile  polyp- biopsy performed and moderate diverticulosis.  He has ho Atrial fibrillation, paroxysmal  No coumadin sec.ho  GI Bld . Noted  Sent to ER after HD Friday  for evaluation of generalized weakness, confusion, cough, and acute on chronic anemia .Then He  reports  2-3 days of shortness of breath and cough.  Chest x-ray was  notable for patchy right basilar opacity suspicious for an infiltrate, was saturating  Ok  on [employer] with normal respirations  Had FOBT+ and hgb 6.5 . Noted from op kid center hgb s= 7.3 (1/03) >8.9 ( 12/06/16)  >9.7 11/29/16)> 8.2 ( 12/06) and noted last Micera dosing at [facility] 11/08/16 / had TFS 14% with Venofer load started to end 12/22/16. DID get Mircera 225 before sent to ER  Friday 12/15/16      Awoken from sleep and slightly confused  To place and reason here with Mumbled response "Okay , Okay." At op kidney center also sometimes confused.He is not able to give me any history. Is just moaning today with me     Past Medical History:  Diagnosis Date  . Anemia   . Arthritis    Gout  . Blind   . BPH (benign prostatic hyperplasia)   . Cardiac arrest ([place])   . CHF (congestive heart failure) ([place])   . CKD (chronic kidney disease), stage IV ([place])    a. L upper extremity AV fistula created 07/2012.  . Colon polyps   . COPD (chronic obstructive pulmonary disease) ([place])   .  Diastolic dysfunction   . Gangrene of finger ([place])    a. 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  . GI (gastrointestinal bleed) feb 2016  . Hypertension    dx--"long time"  . Irregular heart rate 03/03/2015  . Pericardial effusion    a. Noted on echo 10/2009. b. Again seen on echo 09/2013.  [name] Retinitis pigmentosa    Blindness--has shunt --placed yrs ago in [place]..    Past Surgical History:  Procedure Laterality Date  . AMPUTATION  12/30/2012   Procedure: AMPUTATION DIGIT;  Surgeon: Tennis Must, MD;  Location: [place];  Service: Orthopedics;  Laterality: Left;  LEFT SMALL FINGER AMPUTATION  . AV FISTULA PLACEMENT  07/15/2012   Procedure: ARTERIOVENOUS (AV) FISTULA CREATION;  Surgeon: Rosetta Posner, MD;  Location: [place];  Service: Vascular;  Laterality: Left;  . CATARACT EXTRACTION W/ INTRAOCULAR LENS  IMPLANT, BILATERAL Bilateral   . KNEE LIGAMENT RECONSTRUCTION Left   . PERICARDIAL TAP N/A 09/19/2013   Procedure: PERICARDIAL TAP;  Surgeon: Blane Ohara, MD;  Location: [facility] CATH LAB;  Service: Cardiovascular;  Laterality: N/A;  . PERICARDIOCENTESIS  09/2013      Family History  Problem Relation Age of Onset  . Ovarian cancer Mother   . Colon polyps Brother   . Colon cancer Brother   . Kidney disease Neg Hx   . Gallbladder disease Neg  Hx   . Esophageal cancer Neg Hx   . Heart disease Neg Hx   . Stomach cancer Neg Hx       reports that he has been smoking Cigarettes.  He has a 25.00 pack-year smoking history. He has never used smokeless tobacco. He reports that he does not drink alcohol or use drugs.   Allergies  Allergen Reactions  . Ibuprofen Other (See Comments)    Kidney problems-dialysis patient  . Nsaids Other (See Comments)    Kidney problems-dialysis patient    Prior to Admission medications   Medication Sig Start Date End Date Taking? Authorizing Provider  allopurinol (ZYLOPRIM) 100 MG tablet Take 100 mg by mouth daily.    Yes  Historical Provider, MD  aspirin 81 MG tablet Take 81 mg by mouth daily.   Yes Historical Provider, MD  diclofenac sodium (VOLTAREN) 1 % GEL Apply 2 g topically daily as needed (for pain).   Yes Historical Provider, MD  carvedilol (COREG) 12.5 MG tablet Take 1 tablet (12.5 mg total) by mouth 2 (two) times daily with a meal. 09/21/15   Bonnielee Haff, MD  diltiazem (CARDIZEM CD) 120 MG 24 hr capsule Take 1 capsule (120 mg total) by mouth daily. 10/01/15   Dixie Dials, MD  doxercalciferol (HECTOROL) 4 MCG/2ML injection Inject 0.5 mLs (1 mcg total) into the vein every Monday, Wednesday, and Friday with hemodialysis. 08/24/14   [name], DO  ferric gluconate 125 mg in sodium chloride 0.9 % 100 mL Inject 125 mg into the vein every Monday, Wednesday, and Friday with hemodialysis. 08/24/14   [name], DO  SPIRIVA RESPIMAT 2.5 MCG/ACT AERS INHALE 2 PUFFS INTO THE LUNGS EVERY MORNING Patient not taking: Reported on 12/15/2016 08/03/15   [name], MD     Anti-infectives    Start     Dose/Rate Route Frequency Ordered Stop   12/18/16 2000  ceFEPIme (MAXIPIME) 2 g in dextrose 5 % 50 mL IVPB     2 g 100 mL/hr over 30 Minutes Intravenous Every M-W-F (2000) 12/15/16 2026     12/18/16 1200  vancomycin (VANCOCIN) IVPB 750 mg/150 ml premix  Status:  Discontinued     750 mg 150 mL/hr over 60 Minutes Intravenous Every M-W-F (Hemodialysis) 12/15/16 1954 12/17/16 1220   12/15/16 2000  vancomycin (VANCOCIN) 1,500 mg in sodium chloride 0.9 % 500 mL IVPB     1,500 mg 250 mL/hr over 120 Minutes Intravenous  Once 12/15/16 1954 12/16/16 0413   12/15/16 1945  ceFEPIme (MAXIPIME) 2 g in dextrose 5 % 50 mL IVPB     2 g 100 mL/hr over 30 Minutes Intravenous  Once 12/15/16 1940 12/15/16 2053      Results for orders placed or performed during the hospital encounter of 12/15/16 (from the past 48 hour(s))  POC occult blood, ED Provider will collect     Status: Abnormal   Collection Time: 12/15/16  5:39 PM   Result Value Ref Range   Fecal Occult Bld POSITIVE (A) NEGATIVE  CBC with Differential     Status: Abnormal   Collection Time: 12/15/16  6:20 PM  Result Value Ref Range   WBC 2.2 (L) 4.0 - 10.5 K/uL   RBC 2.15 (L) 4.22 - 5.81 MIL/uL   Hemoglobin 6.5 (LL) 13.0 - 17.0 g/dL    Comment: REPEATED TO VERIFY CRITICAL RESULT CALLED TO, READ BACK BY AND VERIFIED WITH: HAMANN,C RN _0  BY GRINSTEAD,C 1.5.18    HCT 21.0 (L)  39.0 - 52.0 %   MCV 97.7 78.0 - 100.0 fL   MCH 30.2 26.0 - 34.0 pg   MCHC 31.0 30.0 - 36.0 g/dL   RDW 17.8 (H) 11.5 - 15.5 %   Platelets 43 (L) 150 - 400 K/uL    Comment: PLATELET COUNT CONFIRMED BY SMEAR REPEATED TO VERIFY    Neutrophils Relative % 66 %   Neutro Abs 1.5 (L) 1.7 - 7.7 K/uL   Lymphocytes Relative 23 %   Lymphs Abs 0.5 (L) 0.7 - 4.0 K/uL   Monocytes Relative 11 %   Monocytes Absolute 0.2 0.1 - 1.0 K/uL   Eosinophils Relative 1 %   Eosinophils Absolute 0.0 0.0 - 0.7 K/uL   Basophils Relative 0 %   Basophils Absolute 0.0 0.0 - 0.1 K/uL  Type and screen [place]     Status: None   Collection Time: 12/15/16  6:20 PM  Result Value Ref Range   ISSUE DATE / TIME [id number]    Blood Product Unit Number [id number]    PRODUCT CODE V8592T24    Unit Type and Rh 5100    Blood Product Expiration Date [id number]   Basic metabolic panel     Status: Abnormal   Collection Time: 12/15/16  6:20 PM  Result Value Ref Range   Sodium 141 135 - 145 mmol/L   Potassium 3.2 (L) 3.5 - 5.1 mmol/L   Chloride 100 (L) 101 - 111 mmol/L   CO2 31 22 - 32 mmol/L   Glucose, Bld 113 (H) 65 - 99 mg/dL   BUN 13 6 - 20 mg/dL   Creatinine, Ser 3.02 (H) 0.61 - 1.24 mg/dL   Calcium 8.3 (L) 8.9 - 10.3 mg/dL   GFR calc non Af Amer 19 (L) >60 mL/min   GFR calc Af Amer 23 (L) >60 mL/min    Comment: (NOTE) The eGFR has been calculated using the CKD EPI equation. This calculation has not been validated in all clinical situations. eGFR's persistently <60  mL/min signify possible Chronic Kidney Disease.    Anion gap 10 5 - 15  Direct antiglobulin test (not at [facility])     Status: None   Collection Time: 12/15/16  6:20 PM  Result Value Ref Range   DAT, complement NEG    DAT, IgG NEG   Prepare RBC     Status: None   Collection Time: 12/15/16  7:38 PM  Result Value Ref Range   Order Confirmation ORDER PROCESSED BY BLOOD BANK   Prepare Pheresed Platelets     Status: None   Collection Time: 12/15/16  8:01 PM  Result Value Ref Range   ISSUE DATE / TIME [id number]    Blood Product Unit Number [id number]    PRODUCT CODE [id number]    Unit Type and Rh 6200    Blood Product Expiration Date [id number]   Culture, blood (routine x 2) Call MD if unable to obtain prior to antibiotics being given     Status: None (Preliminary result)   Collection Time: 12/15/16  8:19 PM  Result Value Ref Range   Specimen Description BLOOD RIGHT ARM    Special Requests BOTTLES DRAWN AEROBIC AND ANAEROBIC 5ML    Culture NO GROWTH < 24 HOURS    Report Status PENDING   Culture, blood (routine x 2) Call MD if unable to obtain prior to antibiotics being given     Status: None (Preliminary result)   Collection Time: 12/15/16  8:24 PM  Result Value  Ref Range   Specimen Description BLOOD RIGHT HAND    Special Requests IN PEDIATRIC BOTTLE 1ML    Culture NO GROWTH < 24 HOURS    Report Status PENDING   I-Stat CG4 Lactic Acid, ED     Status: Abnormal   Collection Time: 12/15/16  8:46 PM  Result Value Ref Range   Lactic Acid, Venous 2.02 (HH) 0.5 - 1.9 mmol/L   Comment NOTIFIED PHYSICIAN   MRSA PCR Screening     Status: None   Collection Time: 12/16/16 12:33 AM  Result Value Ref Range   MRSA by PCR NEGATIVE NEGATIVE    Comment:        The GeneXpert MRSA Assay (FDA approved for NASAL specimens only), is one component of a comprehensive MRSA colonization surveillance program. It is not intended to diagnose MRSA infection nor to guide or monitor treatment  for MRSA infections.   CBC with Differential/Platelet     Status: Abnormal   Collection Time: 12/16/16  4:39 AM  Result Value Ref Range   WBC 2.3 (L) 4.0 - 10.5 K/uL   RBC 2.73 (L) 4.22 - 5.81 MIL/uL   Hemoglobin 8.0 (L) 13.0 - 17.0 g/dL   HCT 25.6 (L) 39.0 - 52.0 %   MCV 93.8 78.0 - 100.0 fL   MCH 29.3 26.0 - 34.0 pg   MCHC 31.3 30.0 - 36.0 g/dL   RDW 18.4 (H) 11.5 - 15.5 %   Platelets 33 (L) 150 - 400 K/uL    Comment: PLATELET COUNT CONFIRMED BY SMEAR CONSISTENT WITH PREVIOUS RESULT    Neutrophils Relative % 63 %   Neutro Abs 1.5 (L) 1.7 - 7.7 K/uL   Lymphocytes Relative 24 %   Lymphs Abs 0.6 (L) 0.7 - 4.0 K/uL   Monocytes Relative 12 %   Monocytes Absolute 0.3 0.1 - 1.0 K/uL   Eosinophils Relative 0 %   Eosinophils Absolute 0.0 0.0 - 0.7 K/uL   Basophils Relative 0 %   Basophils Absolute 0.0 0.0 - 0.1 K/uL  HIV antibody     Status: None   Collection Time: 12/16/16  8:34 AM  Result Value Ref Range   HIV Screen 4th Generation wRfx Non Reactive Non Reactive    Comment: (NOTE) Performed At: [facility] [place], [place] [id number] Lindon Romp MD YH:[id number]   Lactic acid, plasma     Status: Abnormal   Collection Time: 12/16/16  8:34 AM  Result Value Ref Range   Lactic Acid, Venous 2.3 (HH) 0.5 - 1.9 mmol/L    Comment: CRITICAL RESULT CALLED TO, READ BACK BY AND VERIFIED WITH: GRETA [facility] RN AT 1018 12/16/16 BY [facility]   Basic metabolic panel     Status: Abnormal   Collection Time: 12/16/16  8:34 AM  Result Value Ref Range   Sodium 139 135 - 145 mmol/L   Potassium 4.6 3.5 - 5.1 mmol/L    Comment: DELTA CHECK NOTED   Chloride 102 101 - 111 mmol/L   CO2 27 22 - 32 mmol/L   Glucose, Bld 81 65 - 99 mg/dL   BUN 22 (H) 6 - 20 mg/dL   Creatinine, Ser 4.26 (H) 0.61 - 1.24 mg/dL   Calcium 8.2 (L) 8.9 - 10.3 mg/dL   GFR calc non Af Amer 13 (L) >60 mL/min   GFR calc Af Amer 15 (L) >60 mL/min    Comment: (NOTE) The eGFR has been calculated  using the CKD EPI equation. This calculation has not  been validated in all clinical situations. eGFR's persistently <60 mL/min signify possible Chronic Kidney Disease.    Anion gap 10 5 - 15  Magnesium     Status: None   Collection Time: 12/16/16  8:34 AM  Result Value Ref Range   Magnesium 1.8 1.7 - 2.4 mg/dL  Lactate dehydrogenase     Status: Abnormal   Collection Time: 12/16/16 10:57 AM  Result Value Ref Range   LDH 276 (H) 98 - 192 U/L  Hepatic function panel     Status: Abnormal   Collection Time: 12/16/16 10:57 AM  Result Value Ref Range   Total Protein 6.5 6.5 - 8.1 g/dL   Albumin 2.9 (L) 3.5 - 5.0 g/dL   AST 26 15 - 41 U/L   ALT 15 (L) 17 - 63 U/L   Alkaline Phosphatase 67 38 - 126 U/L   Total Bilirubin 1.3 (H) 0.3 - 1.2 mg/dL   Bilirubin, Direct 0.3 0.1 - 0.5 mg/dL   Indirect Bilirubin 1.0 (H) 0.3 - 0.9 mg/dL  Iron and TIBC     Status: Abnormal   Collection Time: 12/16/16 10:57 AM  Result Value Ref Range   Iron 46 45 - 182 ug/dL   TIBC 176 (L) 250 - 450 ug/dL   Saturation Ratios 26 17.9 - 39.5 %   UIBC 130 ug/dL  Vitamin B12     Status: None   Collection Time: 12/16/16 10:57 AM  Result Value Ref Range   Vitamin B-12 755 180 - 914 pg/mL    Comment: (NOTE) This assay is not validated for testing neonatal or myeloproliferative syndrome specimens for Vitamin B12 levels.   Folate     Status: None   Collection Time: 12/16/16 10:57 AM  Result Value Ref Range   Folate 8.6 >5.9 ng/mL  Reticulocytes     Status: Abnormal   Collection Time: 12/16/16 10:57 AM  Result Value Ref Range   Retic Ct Pct 2.4 0.4 - 3.1 %   RBC. 2.79 (L) 4.22 - 5.81 MIL/uL   Retic Count, Manual 67.0 19.0 - 186.0 K/uL  Prepare Pheresed Platelets     Status: None   Collection Time: 12/16/16  1:51 PM  Result Value Ref Range   ISSUE DATE / TIME [id number]    Blood Product Unit Number [id number]    PRODUCT CODE [id number]    Unit Type and Rh 6200    Blood Product Expiration Date [id number]    Strep pneumoniae urinary antigen     Status: None   Collection Time: 12/16/16  5:30 PM  Result Value Ref Range   Strep Pneumo Urinary Antigen NEGATIVE NEGATIVE    Comment:        Infection due to S. pneumoniae cannot be absolutely ruled out since the antigen present may be below the detection limit of the test.   CBC     Status: Abnormal   Collection Time: 12/16/16  6:19 PM  Result Value Ref Range   WBC 2.9 (L) 4.0 - 10.5 K/uL   RBC 2.66 (L) 4.22 - 5.81 MIL/uL   Hemoglobin 7.9 (L) 13.0 - 17.0 g/dL   HCT 25.5 (L) 39.0 - 52.0 %   MCV 95.9 78.0 - 100.0 fL   MCH 29.7 26.0 - 34.0 pg   MCHC 31.0 30.0 - 36.0 g/dL   RDW 19.0 (H) 11.5 - 15.5 %   Platelets 64 (L) 150 - 400 K/uL    Comment: PLATELET COUNT CONFIRMED BY SMEAR LARGE PLATELETS PRESENT  CBC     Status: Abnormal   Collection Time: 12/17/16  5:44 AM  Result Value Ref Range   WBC 2.8 (L) 4.0 - 10.5 K/uL   RBC 2.64 (L) 4.22 - 5.81 MIL/uL   Hemoglobin 8.0 (L) 13.0 - 17.0 g/dL   HCT 25.6 (L) 39.0 - 52.0 %   MCV 97.0 78.0 - 100.0 fL   MCH 30.3 26.0 - 34.0 pg   MCHC 31.3 30.0 - 36.0 g/dL   RDW 18.9 (H) 11.5 - 15.5 %   Platelets 55 (L) 150 - 400 K/uL    Comment: CONSISTENT WITH PREVIOUS RESULT  Renal function panel     Status: Abnormal   Collection Time: 12/17/16  5:44 AM  Result Value Ref Range   Sodium 139 135 - 145 mmol/L   Potassium 4.8 3.5 - 5.1 mmol/L   Chloride 101 101 - 111 mmol/L   CO2 28 22 - 32 mmol/L   Glucose, Bld 93 65 - 99 mg/dL   BUN 33 (H) 6 - 20 mg/dL   Creatinine, Ser 6.10 (H) 0.61 - 1.24 mg/dL   Calcium 8.7 (L) 8.9 - 10.3 mg/dL   Phosphorus 3.7 2.5 - 4.6 mg/dL   Albumin 3.1 (L) 3.5 - 5.0 g/dL   GFR calc non Af Amer 8 (L) >60 mL/min   GFR calc Af Amer 10 (L) >60 mL/min    Comment: (NOTE) The eGFR has been calculated using the CKD EPI equation. This calculation has not been validated in all clinical situations. eGFR's persistently <60 mL/min signify possible Chronic Kidney Disease.    Anion  gap 10 5 - 15  Lipase, blood     Status: None   Collection Time: 12/17/16  8:00 AM  Result Value Ref Range   Lipase 26 11 - 51 U/L    ROS: see hpi   Physical Exam: Vitals:   12/17/16 0653 12/17/16 0825  BP: (!) 178/54 (!) 132/47  Pulse: 76   Resp:    Temp:       General: Chronically ill  BLIND AAM , Chronically ill,  Slightly more Lethargic than his usual baseline , NAD  HEENT: [place] , BLIND, MMM,   Neck: supple Heart: RRR, 2/6 HSM  No rub or gallop Lungs: Coarse Rhonchi R>L,  Non labored breathing Abdomen: BS  ,pos, soft ND, NT Extremities:  No pedal edema Skin: dry  No overt rash or pedal ulcers  Neuro:  Mumbling speech , "OKAY. OKAY"Confused to place, time , does move extrem to request  Dialysis Access:  Pos bruit LUA AVF   Dialysis Orders: Center: [facility]   on MWF . EDW 62.5 HD Bath 2k, 2,25ca  Time 4hr Heparin none . Access LUA AVF     Hec. 4 mcg IV/HD   Mircera 225 mcg q 2 weeks (last given 12/15/16  But prior to that 11/08/16 ?missed dec dosing )   Venofer  100 mg q hd last dose 12/22/16  Other  Op labs  Ca= 8.7 phos = 2.5 , pth 406    Last hgb 7.3  12/13/16  Last  plts 51<65<47,000  Assessment/Plan 1. Symptomatic Anemia / with GI Bld ( FOBT ) and ESRD ANEMIA/  Irondef, anemia  - wu per admit team  Note 1 unit Prbcs given and am HGB  Up to 8.0 reck in am if <8  transfuse 1 unit  On HD / max esa, fe load 2. HCAP- Antib / RX per admit  3. COPD  With  acute exacerbation 4. ESRD -   k 4.8 / HD mwf ON schedule for AM  5. Hypertension/volume  - stable bp and vol okay only 1 kg >edw  .  6. Metabolic bone disease -   hec on hd and binder with meals  7. Leukopenia, thrombocytopenia - has ho chronic  Thrombocytopenia no hep hd  8. Dementia /  About at baseline  But exacerbated by PNA lives at [facility].  Only moaning and nonverbal to my exam- some family at bedside who indicate that this is very different for him ?  Ernest Haber, PA-C [place] [phone] 12/17/2016,  10:33 AM   Patient seen and examined, agree with above note with above modifications.  71 year old BM ESRD at [place] issues - admit in October times 2, December times 2 and now- has hgb in the 6's and also felt to have PNA.  Will provide regular HD tomorrow Corliss Parish, MD 12/17/2016

## 2016-12-18 ENCOUNTER — Inpatient Hospital Stay (HOSPITAL_COMMUNITY): Payer: Medicare Other

## 2016-12-18 LAB — CBC
HCT: 25.7 % — ABNORMAL LOW (ref 39.0–52.0)
HEMOGLOBIN: 8.1 g/dL — AB (ref 13.0–17.0)
MCH: 30.3 pg (ref 26.0–34.0)
MCHC: 31.5 g/dL (ref 30.0–36.0)
MCV: 96.3 fL (ref 78.0–100.0)
PLATELETS: 59 10*3/uL — AB (ref 150–400)
RBC: 2.67 MIL/uL — ABNORMAL LOW (ref 4.22–5.81)
RDW: 18.3 % — AB (ref 11.5–15.5)
WBC: 3.3 10*3/uL — ABNORMAL LOW (ref 4.0–10.5)

## 2016-12-18 LAB — BASIC METABOLIC PANEL
ANION GAP: 13 (ref 5–15)
BUN: 46 mg/dL — AB (ref 6–20)
CALCIUM: 8.7 mg/dL — AB (ref 8.9–10.3)
CO2: 22 mmol/L (ref 22–32)
CREATININE: 8.69 mg/dL — AB (ref 0.61–1.24)
Chloride: 101 mmol/L (ref 101–111)
GFR calc Af Amer: 6 mL/min — ABNORMAL LOW (ref >60)
GFR, EST NON AFRICAN AMERICAN: 5 mL/min — AB (ref >60)
GLUCOSE: 89 mg/dL (ref 65–99)
Potassium: 5.5 mmol/L — ABNORMAL HIGH (ref 3.5–5.1)
Sodium: 136 mmol/L (ref 135–145)

## 2016-12-18 MED ORDER — METHYLPREDNISOLONE SODIUM SUCC 40 MG IJ SOLR
40.0000 mg | Freq: Three times a day (TID) | INTRAMUSCULAR | Status: DC
  Administered 2016-12-18 – 2016-12-21 (×9): 40 mg via INTRAVENOUS
  Filled 2016-12-18 (×10): qty 1

## 2016-12-18 MED ORDER — HALOPERIDOL LACTATE 5 MG/ML IJ SOLN
2.0000 mg | Freq: Four times a day (QID) | INTRAMUSCULAR | Status: DC | PRN
  Administered 2016-12-18 – 2016-12-20 (×3): 2 mg via INTRAVENOUS
  Filled 2016-12-18 (×5): qty 1

## 2016-12-18 MED ORDER — MORPHINE SULFATE (PF) 2 MG/ML IV SOLN
2.0000 mg | Freq: Once | INTRAVENOUS | Status: AC
  Administered 2016-12-18: 2 mg via INTRAVENOUS
  Filled 2016-12-18: qty 1

## 2016-12-18 MED ORDER — ORAL CARE MOUTH RINSE
15.0000 mL | Freq: Two times a day (BID) | OROMUCOSAL | Status: DC
  Administered 2016-12-18 – 2016-12-21 (×5): 15 mL via OROMUCOSAL

## 2016-12-18 NOTE — NC FL2 (Signed)
[place] MEDICAID FL2 LEVEL OF CARE SCREENING TOOL     IDENTIFICATION  Patient Name: Cameron Mendez Birthdate: Feb 17, 1946 Sex: male Admission Date (Current Location): 12/15/2016  [facility] and [place] Number:  Herbalist and Address:  [facility], [place] [address] [place], [place] [address]      Provider Number: [id number]  Attending Physician Name and Address:  Albertine Patricia, MD  Relative Name and Phone Number:  Harvinder, Kozub; [phone] (h), [phone] (mobile)     Current Level of Care: Hospital Recommended Level of Care: [place] Prior Approval Number:    Date Approved/Denied:   PASRR Number: [id number]  Discharge Plan: SNF    Current Diagnoses: Patient Active Problem List   Diagnosis Date Noted  . GI bleed 12/15/2016  . Hypokalemia 12/15/2016  . Gastrointestinal hemorrhage   . Acute dyspnea 11/23/2016  . Cough 11/23/2016  . Respiratory distress 11/23/2016  . Symptomatic anemia 11/18/2015  . Acute coronary syndrome ([place]) 09/28/2015  . HCAP (healthcare-associated pneumonia) 09/18/2015  . Cardiac arrest ([place]) 09/13/2015  . Paroxysmal atrial fibrillation ([place]) 03/03/2015  . Tobacco abuse 01/10/2015  . Pleural effusion 01/09/2015  . Chronic diastolic CHF (congestive heart failure) ([place]) 01/08/2015  . History of adenomatous polyp of colon 12/31/2014  . suspected COPD with acute exacerbation 09/10/2014  . Leukopenia 08/21/2014  . COPD (chronic obstructive pulmonary disease) ([place]) 05/12/2014  . COPD exacerbation ([place]) 04/29/2014  . History of pericardiocentesis 04/27/2014  . Volume overload 04/27/2014  . Anemia 09/18/2013  . Thrombocytopenia ([place]) 09/18/2013  . ESRD on hemodialysis ([place]) 09/18/2013  . Essential hypertension, benign 10/04/2009  . BLINDNESS, [place] DEFINITION 10/01/2009  . OSTEOARTHRITIS 10/01/2009  . Gout, unspecified 09/28/2009    Orientation RESPIRATION BLADDER Height &  Weight     Self, Time, Situation, Place  Normal Continent Weight: 69.4 kg (153 lb) Height:  5\' 4"  (162.6 cm)  BEHAVIORAL SYMPTOMS/MOOD NEUROLOGICAL BOWEL NUTRITION STATUS      Continent Diet (Please see DC Summary)  AMBULATORY STATUS COMMUNICATION OF NEEDS Skin   Extensive Assist Verbally                         Personal Care Assistance Level of Assistance  Bathing, Feeding, Dressing Bathing Assistance: Maximum assistance Feeding assistance: Maximum assistance Dressing Assistance: Maximum assistance     Functional Limitations Info  Sight Sight Info: Impaired        SPECIAL CARE FACTORS FREQUENCY  PT (By licensed PT)                    Contractures Contractures Info: Not present    Additional Factors Info  Code Status, Allergies Code Status Info: Full Allergies Info: Ibuprofen, Nsaids           Current Medications (12/18/2016):  This is the current hospital active medication list Current Facility-Administered Medications  Medication Dose Route Frequency Provider Last Rate Last Dose  . 0.9 %  sodium chloride infusion  250 mL Intravenous PRN Vianne Bulls, MD      . acetaminophen (TYLENOL) tablet 650 mg  650 mg Oral Q6H PRN Albertine Patricia, MD   650 mg at 12/16/16 1840  . albuterol (PROVENTIL) (2.5 MG/3ML) 0.083% nebulizer solution 2.5 mg  2.5 mg Nebulization Q4H PRN Vianne Bulls, MD   2.5 mg at 12/17/16 0654  . allopurinol (ZYLOPRIM) tablet 100 mg  100 mg Oral Daily Timothy S Opyd,  MD   100 mg at 12/18/16 0928  . carvedilol (COREG) tablet 12.5 mg  12.5 mg Oral BID WC Vianne Bulls, MD   12.5 mg at 12/18/16 0927  . ceFEPIme (MAXIPIME) 2 g in dextrose 5 % 50 mL IVPB  2 g Intravenous Q M,W,F-2000 Lauren D Bajbus, RPH      . Darbepoetin Alfa (ARANESP) injection 200 mcg  200 mcg Intravenous Q Mon-HD Ernest Haber, PA-C      . diltiazem (CARDIZEM CD) 24 hr capsule 120 mg  120 mg Oral Daily Vianne Bulls, MD   120 mg at 12/18/16 0927  . doxercalciferol  (HECTOROL) injection 3 mcg  3 mcg Intravenous Q M,W,F-HD Ernest Haber, PA-C      . ferric gluconate (NULECIT) 125 mg in sodium chloride 0.9 % 100 mL IVPB  125 mg Intravenous Q M,W,F-HD Ernest Haber, PA-C      . Derrill Memo ON 12/27/2016] ferric gluconate (NULECIT) 62.5 mg in sodium chloride 0.9 % 100 mL IVPB  62.5 mg Intravenous Q Wed-HD Skeet Simmer, RPH      . guaiFENesin (MUCINEX) 12 hr tablet 1,200 mg  1,200 mg Oral BID Albertine Patricia, MD   1,200 mg at 12/18/16 0924  . ipratropium-albuterol (DUONEB) 0.5-2.5 (3) MG/3ML nebulizer solution 3 mL  3 mL Nebulization Q6H Vianne Bulls, MD   3 mL at 12/18/16 0832  . methylPREDNISolone sodium succinate (SOLU-MEDROL) 40 mg/mL injection 40 mg  40 mg Intravenous Q8H Albertine Patricia, MD   40 mg at 12/18/16 0923  . pantoprazole (PROTONIX) injection 40 mg  40 mg Intravenous Q12H Vianne Bulls, MD   40 mg at 12/18/16 0923  . [place]   Oral PRN Albertine Patricia, MD      . sodium chloride flush (NS) 0.9 % injection 3 mL  3 mL Intravenous Q12H Vianne Bulls, MD   3 mL at 12/18/16 0924  . sodium chloride flush (NS) 0.9 % injection 3 mL  3 mL Intravenous PRN Vianne Bulls, MD         Discharge Medications: Please see discharge summary for a list of discharge medications.  Relevant Imaging Results:  Relevant Lab Results:   Additional Information [phone].  [facility] [place] [facility].  Benard Halsted, LCSWA

## 2016-12-18 NOTE — Clinical Social Work Note (Signed)
Clinical Social Work Assessment  Patient Details  Name: Cameron Mendez MRN: [id number] Date of Birth: May 07, 1946  Date of referral:  12/18/16               Reason for consult:  Facility Placement                Permission sought to share information with:  Family Supports Permission granted to share information::  No  Name::     Margus Ninneman  Agency::  [facility]  Relationship::  Wife  Contact Information:     Housing/Transportation Living arrangements for the past 2 months:  Single Family Home, [place] of Information:  Spouse Patient Interpreter Needed:  None Criminal Activity/Legal Involvement Pertinent to Current Situation/Hospitalization:  No - Comment as needed Significant Relationships:  Spouse Lives with:  Spouse Do you feel safe going back to the place where you live?  No Need for family participation in patient care:  Yes (Comment)  Care giving concerns:  CSW received consult regarding discharge planning. Patient would not say anything when CSW went into the room. CSW spoke with patient's spouse. She stated that she would like for patient to return to [facility] to complete rehab at discharge. CSW to continue to follow for discharge needs.    Social Worker assessment / plan:  CSW spoke with patient and patient's spouse concerning possibility of rehab at [facility] before returning home.  Employment status:  Retired Nurse, adult PT Recommendations:  Not assessed at this time Information / Referral to community resources:  [place]  Patient/Family's Response to care:  Patient's spouse recognizes need for rehab before returning home and is agreeable to returning to [place], where he is also set up for dialysis.   Patient/Family's Understanding of and Emotional Response to Diagnosis, Current Treatment, and Prognosis:  Patient/family is realistic regarding therapy needs and expressed being hopeful for SNF placement.  Patient's spouse expressed understanding of CSW role and discharge process. No questions/concerns about plan or treatment.    Emotional Assessment Appearance:  Appears stated age Attitude/Demeanor/Rapport:  Unable to Assess (No response from patient) Affect (typically observed):  Quiet Orientation:  Oriented to Self, Oriented to Situation, Oriented to Place, Oriented to  Time Alcohol / Substance use:  Tobacco Use, Alcohol Use, Illicit Drugs Psych involvement (Current and /or in the community):  No (Comment)  Discharge Needs  Concerns to be addressed:  Discharge Planning Concerns Readmission within the last 30 days:  No Current discharge risk:  None Barriers to Discharge:  Continued Medical Work up   [employer], [place] 12/18/2016, 11:27 AM

## 2016-12-18 NOTE — Progress Notes (Signed)
Pharmacy Antibiotic Note  Cameron Mendez is a 71 y.o. male admitted on 12/15/2016 with pneumonia.  Patient is ESRD on HD MWF- was brought here from HD center. Patient tells me he did not receive any antibiotics (or any other medications) at HD today.  Continues on cefepime Day # 4  Plan: Continue Cefepime 2 grams iv Q HD Follow up for transition to po antibiotics  Height: 5\' 4"  (162.6 cm) Weight: 153 lb (69.4 kg) IBW/kg (Calculated) : 59.2  Temp (24hrs), Avg:98 F (36.7 C), Min:97.4 F (36.3 C), Max:98.6 F (37 C)   Recent Labs Lab 12/15/16 1820 12/15/16 2046 12/16/16 0439 12/16/16 0834 12/16/16 1819 12/17/16 0544  WBC 2.2*  --  2.3*  --  2.9* 2.8*  CREATININE 3.02*  --   --  4.26*  --  6.10*  LATICACIDVEN  --  2.02*  --  2.3*  --   --     Estimated Creatinine Clearance: 9.4 mL/min (by C-G formula based on SCr of 6.1 mg/dL (H)).    Allergies  Allergen Reactions  . Ibuprofen Other (See Comments)    Kidney problems-dialysis patient  . Nsaids Other (See Comments)    Kidney problems-dialysis patient    Antimicrobials this admission: vancomycin 1/5 >> 1/8 cefepime 1/5 >>   Thank you Anette Guarneri, PharmD [phone]  12/18/2016 8:38 AM

## 2016-12-18 NOTE — Progress Notes (Signed)
Patient very combative with transporters and CT. Provider paged and orders implemented.

## 2016-12-18 NOTE — Care Management Note (Addendum)
Case Management Note  Patient Details  Name: Cameron Mendez MRN: [id number] Date of Birth: Dec 22, 1945  Subjective/Objective:        HCAP/ COPD - with acute COPD exac, on abx, nebs, steroid. HD pt.          Krishang Lohrmann (Spouse)     [phone]      PCP : [name]  Action/Plan: Plan is to d/c back to SNF/Rehab. when medically stable. CSW managing disposition to SNF.  Expected Discharge Date:                  Expected Discharge Plan:  [place] ([place])  In-House Referral:  Clinical Social Work  Discharge planning Services     Post Acute Care Choice:    Choice offered to:  Patient, Spouse [name] )  DME Arranged:    DME Agency:     HH Arranged:    [place] Agency:     Status of Service:  In process, will continue to follow  If discussed at Long Length of Stay Meetings, dates discussed:    Additional Comments:  Sharin Mons, RN 12/18/2016, 11:54 AM

## 2016-12-18 NOTE — Progress Notes (Signed)
PROGRESS NOTE                                                                                                                                                                                                             Patient Demographics:    Cameron Mendez, is a 71 y.o. male, DOB - 1946/04/14, [id number]  Admit date - 12/15/2016   Admitting Physician Vianne Bulls, MD  Outpatient Primary MD for the patient is [name], MD  LOS - 3  Chief Complaint  Patient presents with  . Abnormal Lab       Brief Narrative   71 y.o. male with medical history significant for end-stage renal disease on hemodialysis, COPD, chronic diastolic CHF, paroxysmal atrial fibrillation, chronic thrombocytopenia, and anemia who presents to the emergency department from his dialysis center for evaluation of generalized weakness, confusion, cough, and acute on chronic anemia  Subjective:    Cameron Mendez today has Is not answering any questions, more combative than his baseline, refused dialysis.   Assessment  & Plan :    Principal Problem:   Symptomatic anemia Active Problems:   Essential hypertension, benign   Thrombocytopenia (HCC)   ESRD on hemodialysis (HCC)   COPD exacerbation (HCC)   Leukopenia   Chronic diastolic CHF (congestive heart failure) (HCC)   Paroxysmal atrial fibrillation (HCC)   HCAP (healthcare-associated pneumonia)   GI bleed   Hypokalemia  Symptomatic anemia, FOBT +  - Hemoglobin 6.5 on admission, baseline in 7-8 range. - Hemoccult positive stool on admission, but no evidence of gross bleeding,a few bowel movement since admission, no evidence of bleed  but  FOBT+ - EGD in March 2016 with chronic gastritis, duodenal bulb inflammation, polyp in cardia  - Colonoscopy March 2016 with mild ascending colon diverticulosis and moderate sigmoid diverticulosis, multiple polyps snared  - Will treat with Protonix 40 mg IV q12h,  hold his ASA 81 , 1  H&H and transfuse as needed, received 1 unit PRBC 1/5 with good response, hemoglobin is 8 this a.m.[name] - ESRD anemia/iron deficiency anemia, on erythropoietin, iron per renal team  Acute encephalopathy - Patient less interactive and  Communicative this morning, will obtain stat CT head  HCAP  - Pt presents with 2-3 days of cough, dyspnea, wheezing - Rhonchi on exam, CXR with right basilar infiltrate, no hypoxia  -  Continue empiric treatment with vancomycin and cefepime initially given residence in SNF, HD, and recent hospitalization  , stopped vancomycin  COPD with acute exacerbation  - Likely precipitated by the PNA  - Treating with abx as above  - Hold Spiriva and treat with scheduled DuoNeb  - Patient with wheezing this a.m., will start on low dose steroids  ESRD   - Pt reports completing HD without incident just prior to admission on 12/16/15  - He will receive fluids with blood products and abx  - He reports still making urine; will follow strict I/Os, consider Lasix between or after transfusions as needed    thrombocytopenia  - Has chronic, cytopenia , oncology on input greatly appreciated, no evidence of hemolysis , so far transfused 2 units 8 lit since admission , platelet stable at  59K today, no indication for further transfusion . - F H IT negative  Chronic diastolic CHF  - Volume management with hemodialysis  Atrial fibrillation, paroxysmal   - In a sinus rhythm on admission   - CHADS-VASc 4 (age, CHF, HTN, CAD) - He is not anticoagulated due to chronic thrombocytopenia with GIBs  - Continue diltiazem as tolerated    Hypertension  - BP soft but stable in ED  - Continue Coreg with holding parameters    Code Status : Full  Family Communication  : Discussed with wife via phone  Disposition Plan  : pending further workup.  Consults  :  Renal, oncology  Procedures  : none  DVT Prophylaxis  :  SCD  Lab Results  Component Value Date   PLT 59 (L) 12/18/2016     Antibiotics  :    Anti-infectives    Start     Dose/Rate Route Frequency Ordered Stop   12/18/16 2000  ceFEPIme (MAXIPIME) 2 g in dextrose 5 % 50 mL IVPB     2 g 100 mL/hr over 30 Minutes Intravenous Every M-W-F (2000) 12/15/16 2026     12/18/16 1200  vancomycin (VANCOCIN) IVPB 750 mg/150 ml premix  Status:  Discontinued     750 mg 150 mL/hr over 60 Minutes Intravenous Every M-W-F (Hemodialysis) 12/15/16 1954 12/17/16 1220   12/15/16 2000  vancomycin (VANCOCIN) 1,500 mg in sodium chloride 0.9 % 500 mL IVPB     1,500 mg 250 mL/hr over 120 Minutes Intravenous  Once 12/15/16 1954 12/16/16 0413   12/15/16 1945  ceFEPIme (MAXIPIME) 2 g in dextrose 5 % 50 mL IVPB     2 g 100 mL/hr over 30 Minutes Intravenous  Once 12/15/16 1940 12/15/16 2053        Objective:   Vitals:   12/18/16 0530 12/18/16 0557 12/18/16 0834 12/18/16 0927  BP: (!) 153/42   (!) 146/49  Pulse: 78  84 80  Resp: 18  20   Temp:      TempSrc:      SpO2: 100%  91% 99%  Weight:  69.4 kg (153 lb)    Height:        Wt Readings from Last 3 Encounters:  12/18/16 69.4 kg (153 lb)  12/08/16 63 kg (139 lb)  11/30/16 63 kg (139 lb)     Intake/Output Summary (Last 24 hours) at 12/18/16 1315 Last data filed at 12/18/16 0446  Gross per 24 hour  Intake              160 ml  Output  0 ml  Net              160 ml     Physical Exam  Awake , And able to answer questions or follow any commands, restless Legally blind Supple Neck,No JVD,  Symmetrical Chest wall movement, Good air movement bilaterally, scattered wheezing RRR,No Gallops,Rubs or new Murmurs, No Parasternal Heave +ve B.Sounds, Abd Soft, No tenderness,  No rebound - guarding or rigidity. No Cyanosis, Clubbing or edema, No new Rash or bruise      Data Review:    CBC  Recent Labs Lab 12/15/16 1820 12/16/16 0439 12/16/16 1819 12/17/16 0544 12/18/16 1042  WBC 2.2* 2.3* 2.9* 2.8* 3.3*  HGB 6.5* 8.0* 7.9* 8.0* 8.1*  HCT 21.0*  25.6* 25.5* 25.6* 25.7*  PLT 43* 33* 64* 55* 59*  MCV 97.7 93.8 95.9 97.0 96.3  MCH 30.2 29.3 29.7 30.3 30.3  MCHC 31.0 31.3 31.0 31.3 31.5  RDW 17.8* 18.4* 19.0* 18.9* 18.3*  LYMPHSABS 0.5* 0.6*  --   --   --   MONOABS 0.2 0.3  --   --   --   EOSABS 0.0 0.0  --   --   --   BASOSABS 0.0 0.0  --   --   --     Chemistries   Recent Labs Lab 12/15/16 1820 12/16/16 0834 12/16/16 1057 12/17/16 0544 12/18/16 1042  NA 141 139  --  139 136  K 3.2* 4.6  --  4.8 5.5*  CL 100* 102  --  101 101  CO2 31 27  --  28 22  GLUCOSE 113* 81  --  93 89  BUN 13 22*  --  33* 46*  CREATININE 3.02* 4.26*  --  6.10* 8.69*  CALCIUM 8.3* 8.2*  --  8.7* 8.7*  MG  --  1.8  --   --   --   AST  --   --  26  --   --   ALT  --   --  15*  --   --   ALKPHOS  --   --  67  --   --   BILITOT  --   --  1.3*  --   --    ------------------------------------------------------------------------------------------------------------------ No results for input(s): CHOL, HDL, LDLCALC, TRIG, CHOLHDL, LDLDIRECT in the last 72 hours.  No results found for: HGBA1C ------------------------------------------------------------------------------------------------------------------ No results for input(s): TSH, T4TOTAL, T3FREE, THYROIDAB in the last 72 hours.  Invalid input(s): FREET3 ------------------------------------------------------------------------------------------------------------------  Recent Labs  12/16/16 1057  VITAMINB12 755  FOLATE 8.6  TIBC 176*  IRON 46  RETICCTPCT 2.4    Coagulation profile No results for input(s): INR, PROTIME in the last 168 hours.  No results for input(s): DDIMER in the last 72 hours.  Cardiac Enzymes No results for input(s): CKMB, TROPONINI, MYOGLOBIN in the last 168 hours.  Invalid input(s): CK ------------------------------------------------------------------------------------------------------------------    Component Value Date/Time   BNP 2,264.3 (H) 11/18/2015  [id number]    Inpatient Medications  Scheduled Meds: . allopurinol  100 mg Oral Daily  . carvedilol  12.5 mg Oral BID WC  . ceFEPime (MAXIPIME) IV  2 g Intravenous Q M,W,F-2000  . darbepoetin (ARANESP) injection - DIALYSIS  200 mcg Intravenous Q Mon-HD  . diltiazem  120 mg Oral Daily  . doxercalciferol  3 mcg Intravenous Q M,W,F-HD  . ferric gluconate (FERRLECIT/NULECIT) IV  125 mg Intravenous Q M,W,F-HD  . [START ON 12/27/2016] ferric gluconate (FERRLECIT/NULECIT) IV  62.5 mg Intravenous  Q Wed-HD  . guaiFENesin  1,200 mg Oral BID  . ipratropium-albuterol  3 mL Nebulization Q6H  . methylPREDNISolone (SOLU-MEDROL) injection  40 mg Intravenous Q8H  . pantoprazole (PROTONIX) IV  40 mg Intravenous Q12H  . sodium chloride flush  3 mL Intravenous Q12H   Continuous Infusions: PRN Meds:.sodium chloride, acetaminophen, albuterol, RESOURCE THICKENUP CLEAR, sodium chloride flush  Micro Results Recent Results (from the past 240 hour(s))  Respiratory Panel by PCR     Status: None   Collection Time: 12/15/16  1:30 AM  Result Value Ref Range Status   Adenovirus NOT DETECTED NOT DETECTED Final   Coronavirus 229E NOT DETECTED NOT DETECTED Final   Coronavirus HKU1 NOT DETECTED NOT DETECTED Final   Coronavirus NL63 NOT DETECTED NOT DETECTED Final   Coronavirus OC43 NOT DETECTED NOT DETECTED Final   Metapneumovirus NOT DETECTED NOT DETECTED Final   Rhinovirus / Enterovirus NOT DETECTED NOT DETECTED Final   Influenza A NOT DETECTED NOT DETECTED Final   Influenza B NOT DETECTED NOT DETECTED Final   Parainfluenza Virus 1 NOT DETECTED NOT DETECTED Final   Parainfluenza Virus 2 NOT DETECTED NOT DETECTED Final   Parainfluenza Virus 3 NOT DETECTED NOT DETECTED Final   Parainfluenza Virus 4 NOT DETECTED NOT DETECTED Final   Respiratory Syncytial Virus NOT DETECTED NOT DETECTED Final   Bordetella pertussis NOT DETECTED NOT DETECTED Final   Chlamydophila pneumoniae NOT DETECTED NOT DETECTED Final   Mycoplasma  pneumoniae NOT DETECTED NOT DETECTED Final  Culture, blood (routine x 2) Call MD if unable to obtain prior to antibiotics being given     Status: None (Preliminary result)   Collection Time: 12/15/16  8:19 PM  Result Value Ref Range Status   Specimen Description BLOOD RIGHT ARM  Final   Special Requests BOTTLES DRAWN AEROBIC AND ANAEROBIC 5ML  Final   Culture NO GROWTH 2 DAYS  Final   Report Status PENDING  Incomplete  Culture, blood (routine x 2) Call MD if unable to obtain prior to antibiotics being given     Status: None (Preliminary result)   Collection Time: 12/15/16  8:24 PM  Result Value Ref Range Status   Specimen Description BLOOD RIGHT HAND  Final   Special Requests IN PEDIATRIC BOTTLE 1ML  Final   Culture NO GROWTH 2 DAYS  Final   Report Status PENDING  Incomplete  MRSA PCR Screening     Status: None   Collection Time: 12/16/16 12:33 AM  Result Value Ref Range Status   MRSA by PCR NEGATIVE NEGATIVE Final    Comment:        The GeneXpert MRSA Assay (FDA approved for NASAL specimens only), is one component of a comprehensive MRSA colonization surveillance program. It is not intended to diagnose MRSA infection nor to guide or monitor treatment for MRSA infections.     Radiology Reports Ct Abdomen Pelvis Wo Contrast  Result Date: 12/17/2016 CLINICAL DATA:  Abdominal pain and fatigue EXAM: CT ABDOMEN AND PELVIS WITHOUT CONTRAST TECHNIQUE: Multidetector CT imaging of the abdomen and pelvis was performed following the standard protocol without IV contrast. COMPARISON:  12/16/2016 FINDINGS: Lower chest: Small bilateral pleural effusions are noted. Bilateral lower lobe consolidation is noted right greater than left. Hepatobiliary: The liver is within normal limits. The gallbladder is well distended with dependent gallstones. No biliary obstructive changes are seen. Pancreas: Unremarkable. No pancreatic ductal dilatation or surrounding inflammatory changes. Spleen: Spleen is  enlarged when compared with the prior exam and demonstrates at least  2 hypodense lesions which are poorly characterized on this noncontrast study. These were not present on the prior exam Adrenals/Urinary Tract: The adrenal glands are within normal limits bilaterally. Bilateral nonobstructing renal stones are seen. Diffuse renal vascular calcifications are noted. Scattered cystic lesions are noted in both kidneys. No obstructive changes are seen. The bladder is decompressed. Stomach/Bowel: The appendix is well visualized. No inflammatory changes are seen. Diverticular change of the colon is noted without diverticulitis. Vascular/Lymphatic: Heavy atherosclerotic calcifications of the aorta are noted without aneurysmal dilatation. No significant lymphatic abnormality is seen. Reproductive: Prostate is unremarkable. Other: 2 catheters extending into the peritoneal cavity are noted consistent with ventriculoperitoneal shunts. Some mild free fluid is noted within the abdomen likely related to these catheters. Musculoskeletal: Degenerative changes of the lumbar spine are seen. No acute bony abnormality is noted. IMPRESSION: Hypodense lesions within the spleen which are incompletely characterized on this exam. Nonemergent outpatient MRI evaluation is recommended when the patient's condition improves to allow for optimum characterization. Bilateral pleural effusions and bibasilar consolidation. Mild free fluid within the abdomen related to ventricular shunt catheters. Nonobstructing renal calculi bilaterally. Electronically Signed   By: Inez Catalina M.D.   On: 12/17/2016 11:25   Dg Chest 2 View  Result Date: 12/15/2016 CLINICAL DATA:  Initial evaluation for acute cough, generalized weakness. EXAM: CHEST  2 VIEW COMPARISON:  Prior radiograph from 11/24/2016. FINDINGS: Cardiomegaly, stable from prior. Mediastinal silhouette within normal limits. Aortic atherosclerosis noted. Lungs mildly hypoinflated. Shunt catheter tubing  overlies the thorax. Diffuse vascular congestion without overt pulmonary edema. Small right pleural effusion. Patchy right basilar opacity, suspicious for possible infiltrate given the provided history. No pneumothorax. No acute osseous abnormality. IMPRESSION: 1. Patchy right basilar opacity, suspicious for possible infiltrate given the provided history of cough. 2. Stable cardiomegaly with diffuse pulmonary vascular congestion without overt pulmonary edema. 3. Small right pleural effusion. 4. Aortic atherosclerosis. Electronically Signed   By: Jeannine Boga M.D.   On: 12/15/2016 18:41   Dg Chest 2 View  Result Date: 11/24/2016 CLINICAL DATA:  Two weeks of cough and other rep sperm tori symptoms. No definite fever. EXAM: CHEST  2 VIEW COMPARISON:  Portable chest x-ray of November 23, 2016 FINDINGS: The lungs are borderline hypoinflated. The interstitial markings are increased. The pulmonary vascularity is engorged. The cardiac silhouette is enlarged. There is a small right pleural effusion layering posteriorly. There is calcification in the wall of the thoracic aorta. A ventriculoperitoneal shunt tube is present. The observed bony thorax exhibits no acute abnormality. IMPRESSION: CHF with mild pulmonary interstitial edema. Small right pleural effusion. No definite pneumonia. Thoracic aortic atherosclerosis. Electronically Signed   By: David  Martinique M.D.   On: 11/24/2016 13:40   Dg Chest Port 1 View  Result Date: 11/23/2016 CLINICAL DATA:  Dyspnea, CHF, hypertension, stage IV chronic kidney disease EXAM: PORTABLE CHEST 1 VIEW COMPARISON:  Portable exam 1219 hours compared to 11/23/2016 at 0604 hours FINDINGS: VP shunt tubing traverses RIGHT hemithorax. Enlargement of cardiac silhouette with pulmonary vascular congestion. Stable mediastinal contours. Atherosclerotic calcification aorta. Small RIGHT pleural effusion with questionable tiny LEFT pleural effusion. Mild RIGHT basilar opacity, improved  from earlier study, question atelectasis or slightly improved edema. Remaining lungs clear. No pneumothorax. Bones demineralized. IMPRESSION: Enlargement of cardiac silhouette with pulmonary vascular congestion. Small RIGHT pleural effusion with slightly improved aeration since the earlier study question improved atelectasis versus edema. No Electronically Signed   By: Lavonia Dana M.D.   On: 11/23/2016 12:27  Dg Chest Portable 1 View  Result Date: 11/23/2016 CLINICAL DATA:  71 year old male with shortness of breath and cough. EXAM: PORTABLE CHEST 1 VIEW COMPARISON:  Chest radiograph dated 11/21/2015 FINDINGS: There is stable cardiomegaly. Diffuse interstitial prominence and nodularity may represent mild edema. Right lung base hazy density may represent atelectasis versus infiltrate. A small right pleural effusion noted. There is no pneumothorax. A shunt catheter is noted along the right hemithorax. No acute osseous pathology identified. IMPRESSION: Small right pleural effusion with right lung base atelectasis versus infiltrate. Stable cardiomegaly with possible mild interstitial edema. Electronically Signed   By: Anner Crete M.D.   On: 11/23/2016 06:18   Dg Abd Acute W/chest  Result Date: 12/17/2016 CLINICAL DATA:  Abdominal pain today EXAM: DG ABDOMEN ACUTE W/ 1V CHEST COMPARISON:  12/15/2016 FINDINGS: The abdominal gas pattern is normal. Ventriculoperitoneal shunt tubing projects over the chest and abdomen. There are calcifications along the expected location of the pancreas which likely relate to chronic pancreatitis. The upright view of the chest demonstrates a small right pleural effusion and mild linear basilar lung opacities, as well as moderate vascular and interstitial congestive changes. Unchanged cardiomegaly. IMPRESSION: 1. No evidence of bowel obstruction or perforation. 2. Question pancreatic calcifications, suggesting chronic pancreatitis. 3. Vascular and interstitial prominence,  cardiomegaly, and small right pleural effusion. This may represent mild congestive heart failure. Electronically Signed   By: Andreas Newport M.D.   On: 12/17/2016 00:15     ELGERGAWY, DAWOOD M.D on 12/18/2016 at 1:15 PM  Between 7am to 7pm - Pager - [phone]  After 7pm go to [email or web address] - password [facility]  [facility] -  Office  [phone]

## 2016-12-18 NOTE — Progress Notes (Signed)
Pt confused and refuse to do hemodialysis, Dr Jonnie Finner notified and order received as follow: send pt back to his room.

## 2016-12-18 NOTE — Progress Notes (Signed)
SLP Cancellation Note  Patient Details Name: Cameron Mendez MRN: [id number] DOB: [birth year]   Cancelled treatment:       Reason Eval/Treat Not Completed: Medical issues which prohibited therapy. Attempted MBS, pt could not tolerate procedure due to refusal and agitation. For now, continue current diet. Will f/u for tolerance.    Betzy Barbier, Katherene Ponto 12/18/2016, 9:58 AM

## 2016-12-18 NOTE — Progress Notes (Signed)
Pt keep moaning looks  Restless and yelling the whole time pt not able tell what is wrong with him, on call physcian schoor was page about pt situation gave one time dose of morphine iv 2mg ,  Order carried out and pt is resting now I will continue to monitor pt

## 2016-12-18 NOTE — Progress Notes (Signed)
Paged provider because patient more agitated than usual and refusing dialysis, meds and swallow screening. Will continue to monitor.

## 2016-12-18 NOTE — Progress Notes (Signed)
[facility] Progress Note   Subjective: not responding very well  Vitals:   12/18/16 0229 12/18/16 0530 12/18/16 0557 12/18/16 0834  BP:  (!) 153/42    Pulse: 88 78  84  Resp: 20 18  20   Temp:      TempSrc:      SpO2:  100%  91%  Weight:   69.4 kg (153 lb)   Height:        Inpatient medications: . allopurinol  100 mg Oral Daily  . carvedilol  12.5 mg Oral BID WC  . ceFEPime (MAXIPIME) IV  2 g Intravenous Q M,W,F-2000  . darbepoetin (ARANESP) injection - DIALYSIS  200 mcg Intravenous Q Mon-HD  . diltiazem  120 mg Oral Daily  . doxercalciferol  3 mcg Intravenous Q M,W,F-HD  . ferric gluconate (FERRLECIT/NULECIT) IV  125 mg Intravenous Q M,W,F-HD  . [START ON 12/27/2016] ferric gluconate (FERRLECIT/NULECIT) IV  62.5 mg Intravenous Q Wed-HD  . guaiFENesin  1,200 mg Oral BID  . ipratropium-albuterol  3 mL Nebulization Q6H  . methylPREDNISolone (SOLU-MEDROL) injection  40 mg Intravenous Q8H  . pantoprazole (PROTONIX) IV  40 mg Intravenous Q12H  . sodium chloride flush  3 mL Intravenous Q12H    sodium chloride, acetaminophen, albuterol, RESOURCE THICKENUP CLEAR, sodium chloride flush  Exam: Chron ill appearing, not in distress, coughing/ wheezing some Not responding to voice commands, on FM O2 No jvd Chest dec'd bilat bases, diffuse exp wheezing RRR no mrg Abd soft ntnd no mass No LE edema LUA AVF Neuro is confused, nonverbal, moaning and coughs occ  Dialysis: [facility]   4h   62.5kg   2/2.25 bath   Hep none  LUA AVF  - Hect 4 ug, pth 406, P 2.5, Ca 8.7  - Mirc 225 last 1/5  - Venofer 100/hd last 1/12      Assessment: 1. HCAP/ COPD - with acute COPD exac, on abx, nebs, steroids; still tight 2. Anemia/ FOBT+ - on max esa and Fe load 3. ESRD HD MWF 4. HTN stable on metop/ dilt 5. Vol stable, doubt wt today 6. AMS - prob d/t acute illness 7. Blind d/t RP 8. 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5* 1.5*  --   --   HGB 6.5* 8.0* 7.9* 8.0*  HCT 21.0* 25.6* 25.5* 25.6*  MCV 97.7 93.8 95.9 97.0  PLT 43* 33* 64* 55*   Iron/TIBC/Ferritin/ %Sat    Component Value Date/Time   IRON 46 12/16/2016 1057   TIBC 176 (L) 12/16/2016 1057   FERRITIN 1,185 (H) 11/18/2015 0955   IRONPCTSAT 26 12/16/2016 1057  IRONPCTSAT 22 05/18/2009 1203

## 2016-12-19 ENCOUNTER — Inpatient Hospital Stay (HOSPITAL_COMMUNITY): Payer: Medicare Other

## 2016-12-19 LAB — BASIC METABOLIC PANEL
ANION GAP: 15 (ref 5–15)
BUN: 22 mg/dL — AB (ref 6–20)
CALCIUM: 8.9 mg/dL (ref 8.9–10.3)
CO2: 23 mmol/L (ref 22–32)
CREATININE: 4.92 mg/dL — AB (ref 0.61–1.24)
Chloride: 97 mmol/L — ABNORMAL LOW (ref 101–111)
GFR calc Af Amer: 13 mL/min — ABNORMAL LOW (ref >60)
GFR, EST NON AFRICAN AMERICAN: 11 mL/min — AB (ref >60)
Glucose, Bld: 92 mg/dL (ref 65–99)
Potassium: 4.9 mmol/L (ref 3.5–5.1)
Sodium: 135 mmol/L (ref 135–145)

## 2016-12-19 LAB — CBC
HEMATOCRIT: 22.6 % — AB (ref 39.0–52.0)
Hemoglobin: 7.1 g/dL — ABNORMAL LOW (ref 13.0–17.0)
MCH: 30.1 pg (ref 26.0–34.0)
MCHC: 31.4 g/dL (ref 30.0–36.0)
MCV: 95.8 fL (ref 78.0–100.0)
PLATELETS: 69 10*3/uL — AB (ref 150–400)
RBC: 2.36 MIL/uL — ABNORMAL LOW (ref 4.22–5.81)
RDW: 18.6 % — AB (ref 11.5–15.5)
WBC: 4.2 10*3/uL (ref 4.0–10.5)

## 2016-12-19 LAB — PREPARE RBC (CROSSMATCH)

## 2016-12-19 MED ORDER — HALOPERIDOL LACTATE 5 MG/ML IJ SOLN
5.0000 mg | Freq: Once | INTRAMUSCULAR | Status: AC
Start: 2016-12-19 — End: 2016-12-19
  Administered 2016-12-19: 5 mg via INTRAVENOUS

## 2016-12-19 MED ORDER — SODIUM CHLORIDE 0.9 % IV SOLN: Freq: Once | INTRAVENOUS | Status: DC

## 2016-12-19 MED ORDER — LORAZEPAM 2 MG/ML IJ SOLN: 1.0000 mg | Freq: Once | INTRAMUSCULAR | Status: DC

## 2016-12-19 MED ORDER — HYDRALAZINE HCL 20 MG/ML IJ SOLN
5.0000 mg | INTRAMUSCULAR | Status: DC | PRN
  Administered 2016-12-19: 5 mg via INTRAVENOUS
  Filled 2016-12-19: qty 1

## 2016-12-19 MED ORDER — LORAZEPAM 2 MG/ML IJ SOLN
INTRAMUSCULAR | Status: AC
  Administered 2016-12-19: 1 mg via INTRAVENOUS
  Filled 2016-12-19: qty 1

## 2016-12-19 MED ORDER — HALOPERIDOL LACTATE 5 MG/ML IJ SOLN
5.0000 mg | Freq: Once | INTRAMUSCULAR | Status: DC | PRN
  Filled 2016-12-19: qty 1

## 2016-12-19 NOTE — Progress Notes (Signed)
Called to hemodialysis to see if they could see patient while he is calm this morning. They requested to call me back.

## 2016-12-19 NOTE — Care Management Important Message (Signed)
Important Message  Patient Details  Name: Cameron Mendez MRN: [id number] Date of Birth: [birth year]   Medicare Important Message Given:  Yes    Nathen May 12/19/2016, 10:33 AM

## 2016-12-19 NOTE — Progress Notes (Signed)
Patient very combative this morning.  CT not done yesterday due to being combative inspite of haldol, lab could not get blood draw this morning. Patient refusing all medications

## 2016-12-19 NOTE — Progress Notes (Signed)
PROGRESS NOTE                                                                                                                                                                                                             Patient Demographics:    Cameron Mendez, is a 71 y.o. male, DOB - [birth year], [id number]  Admit date - 12/15/2016   Admitting Physician Vianne Bulls, MD  Outpatient Primary MD for the patient is [name], MD  LOS - 4  Chief Complaint  Patient presents with  . Abnormal Lab       Brief Narrative   71 y.o. male with medical history significant for end-stage renal disease on hemodialysis, COPD, chronic diastolic CHF, paroxysmal atrial fibrillation, chronic thrombocytopenia, and anemia who presents to the emergency department from his dialysis center for evaluation of generalized weakness, confusion, cough, and acute on chronic anemia, And remained stable after 1 unit PRBC transfusion, no evidence of significant GI bleed, started on steroids for significant wheezing secondary to COPD. Patient with significant agitation/delirium where he refused dialysis yesterday blood work and CT scan, if today with Haldol and Ativan, CT head with no acute finding  Subjective:    [name] today has Is much more calm, but is after receiving Haldol in a.m. for CT head     Assessment  & Plan :    Principal Problem:   Symptomatic anemia Active Problems:   Essential hypertension, benign   Thrombocytopenia (HCC)   ESRD on hemodialysis (HCC)   COPD exacerbation (HCC)   Leukopenia   Chronic diastolic CHF (congestive heart failure) (HCC)   Paroxysmal atrial fibrillation (HCC)   HCAP (healthcare-associated pneumonia)   GI bleed   Hypokalemia  Symptomatic anemia, FOBT +  - Hemoglobin 6.5 on admission, baseline in 7-8 range. - Hemoccult positive stool on admission, but no evidence of gross bleeding,a few bowel movement since admission, no  evidence of bleed  but  FOBT+ - EGD in March 2016 with chronic gastritis, duodenal bulb inflammation, polyp in cardia  - Colonoscopy March 2016 with mild ascending colon diverticulosis and moderate sigmoid diverticulosis, multiple polyps snared  - Will treat with Protonix 40 mg IV q12h,  hold his ASA 81 , monitor  H&H and transfuse as needed, received 1 unit PRBC 1/5 with good response,.. - ESRD anemia/iron  deficiency anemia, on erythropoietin, iron per renal team  Acute encephalopathy - Patient with significant agitation 1/8, most likely related to delirium versus acute infection, requiring haloperidol multiple times, refused dialysis yesterday, currently being dialyzed after received IV Ativan. - cont  with Haldol when necessary, may consider Seroquel if continue to be agitated. - CT head with no acute findings.   HCAP  - Pt presents with 2-3 days of cough, dyspnea, wheezing - Rhonchi on exam, CXR with right basilar infiltrate, no hypoxia  - on ancomycin and cefepime initially given residence in SNF, HD, and recent hospitalization  , stopped vancomycin   COPD with acute exacerbation  - Likely precipitated by the PNA  - Treating with abx as above  - Hold Spiriva and treat with scheduled DuoNeb  - Patient with wheezing so on low dose steroids  ESRD   - Pt reports completing HD without incident just prior to admission on 12/16/15  - Refused hemodialysis yesterday, so is being dialyzed today off schedule  thrombocytopenia  - Has chronic, cytopenia , oncology on input greatly appreciated, no evidence of hemolysis , so far transfused 2 units platelets  since admission , follow on platelet today - HIT negative  Chronic diastolic CHF  - Volume management with hemodialysis  Atrial fibrillation, paroxysmal   - In a sinus rhythm on admission   - CHADS-VASc 4 (age, CHF, HTN, CAD) - He is not anticoagulated due to chronic thrombocytopenia with GIBs  - Continue diltiazem as tolerated      Hypertension  - Continue Coreg with holding parameters    Code Status : Full  Family Communication  : Status with wife and daughter at bedside 1/8  Disposition Plan  : pending further workup.  Consults  :  Renal, oncology  Procedures  : none  DVT Prophylaxis  :  SCD  Lab Results  Component Value Date   PLT 59 (L) 12/18/2016    Antibiotics  :    Anti-infectives    Start     Dose/Rate Route Frequency Ordered Stop   12/18/16 2000  ceFEPIme (MAXIPIME) 2 g in dextrose 5 % 50 mL IVPB     2 g 100 mL/hr over 30 Minutes Intravenous Every M-W-F (2000) 12/15/16 2026     12/18/16 1200  vancomycin (VANCOCIN) IVPB 750 mg/150 ml premix  Status:  Discontinued     750 mg 150 mL/hr over 60 Minutes Intravenous Every M-W-F (Hemodialysis) 12/15/16 1954 12/17/16 1220   12/15/16 2000  vancomycin (VANCOCIN) 1,500 mg in sodium chloride 0.9 % 500 mL IVPB     1,500 mg 250 mL/hr over 120 Minutes Intravenous  Once 12/15/16 1954 12/16/16 0413   12/15/16 1945  ceFEPIme (MAXIPIME) 2 g in dextrose 5 % 50 mL IVPB     2 g 100 mL/hr over 30 Minutes Intravenous  Once 12/15/16 1940 12/15/16 2053        Objective:   Vitals:   12/18/16 2131 12/19/16 0148 12/19/16 0542 12/19/16 1038  BP: (!) 163/106  132/80   Pulse: 87  62   Resp: 18  18   Temp: 97.4 F (36.3 C)     TempSrc:      SpO2: 98% 97% 91% 97%  Weight:      Height:        Wt Readings from Last 3 Encounters:  12/18/16 69.4 kg (153 lb)  12/08/16 63 kg (139 lb)  11/30/16 63 kg (139 lb)     Intake/Output Summary (Last 24  hours) at 12/19/16 1331 Last data filed at 12/19/16 1049  Gross per 24 hour  Intake               50 ml  Output                0 ml  Net               50 ml     Physical Exam  Sleeping and come today, Legally blind Supple Neck,No JVD,  Symmetrical Chest wall movement, Good air movement bilaterally, scattered wheezing RRR,No Gallops,Rubs or new Murmurs, No Parasternal Heave +ve B.Sounds, Abd Soft, No  tenderness,  No rebound - guarding or rigidity. No Cyanosis, Clubbing or edema, No new Rash or bruise      Data Review:    CBC  Recent Labs Lab 12/15/16 1820 12/16/16 0439 12/16/16 1819 12/17/16 0544 12/18/16 1042  WBC 2.2* 2.3* 2.9* 2.8* 3.3*  HGB 6.5* 8.0* 7.9* 8.0* 8.1*  HCT 21.0* 25.6* 25.5* 25.6* 25.7*  PLT 43* 33* 64* 55* 59*  MCV 97.7 93.8 95.9 97.0 96.3  MCH 30.2 29.3 29.7 30.3 30.3  MCHC 31.0 31.3 31.0 31.3 31.5  RDW 17.8* 18.4* 19.0* 18.9* 18.3*  LYMPHSABS 0.5* 0.6*  --   --   --   MONOABS 0.2 0.3  --   --   --   EOSABS 0.0 0.0  --   --   --   BASOSABS 0.0 0.0  --   --   --     Chemistries   Recent Labs Lab 12/15/16 1820 12/16/16 0834 12/16/16 1057 12/17/16 0544 12/18/16 1042  NA 141 139  --  139 136  K 3.2* 4.6  --  4.8 5.5*  CL 100* 102  --  101 101  CO2 31 27  --  28 22  GLUCOSE 113* 81  --  93 89  BUN 13 22*  --  33* 46*  CREATININE 3.02* 4.26*  --  6.10* 8.69*  CALCIUM 8.3* 8.2*  --  8.7* 8.7*  MG  --  1.8  --   --   --   AST  --   --  26  --   --   ALT  --   --  15*  --   --   ALKPHOS  --   --  67  --   --   BILITOT  --   --  1.3*  --   --    ------------------------------------------------------------------------------------------------------------------ No results for input(s): CHOL, HDL, LDLCALC, TRIG, CHOLHDL, LDLDIRECT in the last 72 hours.  No results found for: HGBA1C ------------------------------------------------------------------------------------------------------------------ No results for input(s): TSH, T4TOTAL, T3FREE, THYROIDAB in the last 72 hours.  Invalid input(s): FREET3 ------------------------------------------------------------------------------------------------------------------ No results for input(s): VITAMINB12, FOLATE, FERRITIN, TIBC, IRON, RETICCTPCT in the last 72 hours.  Coagulation profile No results for input(s): INR, PROTIME in the last 168 hours.  No results for input(s): DDIMER in the last 72  hours.  Cardiac Enzymes No results for input(s): CKMB, TROPONINI, MYOGLOBIN in the last 168 hours.  Invalid input(s): CK ------------------------------------------------------------------------------------------------------------------    Component Value Date/Time   BNP 2,264.3 (H) 11/18/2015 [id number]    Inpatient Medications  Scheduled Meds: . allopurinol  100 mg Oral Daily  . carvedilol  12.5 mg Oral BID WC  . ceFEPime (MAXIPIME) IV  2 g Intravenous Q M,W,F-2000  . darbepoetin (ARANESP) injection - DIALYSIS  200 mcg Intravenous Q Mon-HD  . diltiazem  120 mg Oral  Daily  . doxercalciferol  3 mcg Intravenous Q M,W,F-HD  . ferric gluconate (FERRLECIT/NULECIT) IV  125 mg Intravenous Q M,W,F-HD  . [START ON 12/27/2016] ferric gluconate (FERRLECIT/NULECIT) IV  62.5 mg Intravenous Q Wed-HD  . guaiFENesin  1,200 mg Oral BID  . ipratropium-albuterol  3 mL Nebulization Q6H  . LORazepam  1 mg Intravenous Once  . mouth rinse  15 mL Mouth Rinse BID  . methylPREDNISolone (SOLU-MEDROL) injection  40 mg Intravenous Q8H  . pantoprazole (PROTONIX) IV  40 mg Intravenous Q12H  . sodium chloride flush  3 mL Intravenous Q12H   Continuous Infusions: PRN Meds:.sodium chloride, acetaminophen, albuterol, haloperidol lactate, haloperidol lactate, RESOURCE THICKENUP CLEAR, sodium chloride flush  Micro Results Recent Results (from the past 240 hour(s))  Respiratory Panel by PCR     Status: None   Collection Time: 12/15/16  1:30 AM  Result Value Ref Range Status   Adenovirus NOT DETECTED NOT DETECTED Final   Coronavirus 229E NOT DETECTED NOT DETECTED Final   Coronavirus HKU1 NOT DETECTED NOT DETECTED Final   Coronavirus NL63 NOT DETECTED NOT DETECTED Final   Coronavirus OC43 NOT DETECTED NOT DETECTED Final   Metapneumovirus NOT DETECTED NOT DETECTED Final   Rhinovirus / Enterovirus NOT DETECTED NOT DETECTED Final   Influenza A NOT DETECTED NOT DETECTED Final   Influenza B NOT DETECTED NOT DETECTED Final    Parainfluenza Virus 1 NOT DETECTED NOT DETECTED Final   Parainfluenza Virus 2 NOT DETECTED NOT DETECTED Final   Parainfluenza Virus 3 NOT DETECTED NOT DETECTED Final   Parainfluenza Virus 4 NOT DETECTED NOT DETECTED Final   Respiratory Syncytial Virus NOT DETECTED NOT DETECTED Final   Bordetella pertussis NOT DETECTED NOT DETECTED Final   Chlamydophila pneumoniae NOT DETECTED NOT DETECTED Final   Mycoplasma pneumoniae NOT DETECTED NOT DETECTED Final  Culture, blood (routine x 2) Call MD if unable to obtain prior to antibiotics being given     Status: None (Preliminary result)   Collection Time: 12/15/16  8:19 PM  Result Value Ref Range Status   Specimen Description BLOOD RIGHT ARM  Final   Special Requests BOTTLES DRAWN AEROBIC AND ANAEROBIC 5ML  Final   Culture NO GROWTH 3 DAYS  Final   Report Status PENDING  Incomplete  Culture, blood (routine x 2) Call MD if unable to obtain prior to antibiotics being given     Status: None (Preliminary result)   Collection Time: 12/15/16  8:24 PM  Result Value Ref Range Status   Specimen Description BLOOD RIGHT HAND  Final   Special Requests IN PEDIATRIC BOTTLE 1ML  Final   Culture NO GROWTH 3 DAYS  Final   Report Status PENDING  Incomplete  MRSA PCR Screening     Status: None   Collection Time: 12/16/16 12:33 AM  Result Value Ref Range Status   MRSA by PCR NEGATIVE NEGATIVE Final    Comment:        The GeneXpert MRSA Assay (FDA approved for NASAL specimens only), is one component of a comprehensive MRSA colonization surveillance program. It is not intended to diagnose MRSA infection nor to guide or monitor treatment for MRSA infections.     Radiology Reports Ct Abdomen Pelvis Wo Contrast  Result Date: 12/17/2016 CLINICAL DATA:  Abdominal pain and fatigue EXAM: CT ABDOMEN AND PELVIS WITHOUT CONTRAST TECHNIQUE: Multidetector CT imaging of the abdomen and pelvis was performed following the standard protocol without IV contrast.  COMPARISON:  12/16/2016 FINDINGS: Lower chest: Small bilateral pleural effusions  are noted. Bilateral lower lobe consolidation is noted right greater than left. Hepatobiliary: The liver is within normal limits. The gallbladder is well distended with dependent gallstones. No biliary obstructive changes are seen. Pancreas: Unremarkable. No pancreatic ductal dilatation or surrounding inflammatory changes. Spleen: Spleen is enlarged when compared with the prior exam and demonstrates at least 2 hypodense lesions which are poorly characterized on this noncontrast study. These were not present on the prior exam Adrenals/Urinary Tract: The adrenal glands are within normal limits bilaterally. Bilateral nonobstructing renal stones are seen. Diffuse renal vascular calcifications are noted. Scattered cystic lesions are noted in both kidneys. No obstructive changes are seen. The bladder is decompressed. Stomach/Bowel: The appendix is well visualized. No inflammatory changes are seen. Diverticular change of the colon is noted without diverticulitis. Vascular/Lymphatic: Heavy atherosclerotic calcifications of the aorta are noted without aneurysmal dilatation. No significant lymphatic abnormality is seen. Reproductive: Prostate is unremarkable. Other: 2 catheters extending into the peritoneal cavity are noted consistent with ventriculoperitoneal shunts. Some mild free fluid is noted within the abdomen likely related to these catheters. Musculoskeletal: Degenerative changes of the lumbar spine are seen. No acute bony abnormality is noted. IMPRESSION: Hypodense lesions within the spleen which are incompletely characterized on this exam. Nonemergent outpatient MRI evaluation is recommended when the patient's condition improves to allow for optimum characterization. Bilateral pleural effusions and bibasilar consolidation. Mild free fluid within the abdomen related to ventricular shunt catheters. Nonobstructing renal calculi  bilaterally. Electronically Signed   By: Inez Catalina M.D.   On: 12/17/2016 11:25   Dg Chest 2 View  Result Date: 12/15/2016 CLINICAL DATA:  Initial evaluation for acute cough, generalized weakness. EXAM: CHEST  2 VIEW COMPARISON:  Prior radiograph from 11/24/2016. FINDINGS: Cardiomegaly, stable from prior. Mediastinal silhouette within normal limits. Aortic atherosclerosis noted. Lungs mildly hypoinflated. Shunt catheter tubing overlies the thorax. Diffuse vascular congestion without overt pulmonary edema. Small right pleural effusion. Patchy right basilar opacity, suspicious for possible infiltrate given the provided history. No pneumothorax. No acute osseous abnormality. IMPRESSION: 1. Patchy right basilar opacity, suspicious for possible infiltrate given the provided history of cough. 2. Stable cardiomegaly with diffuse pulmonary vascular congestion without overt pulmonary edema. 3. Small right pleural effusion. 4. Aortic atherosclerosis. Electronically Signed   By: Jeannine Boga M.D.   On: 12/15/2016 18:41   Dg Chest 2 View  Result Date: 11/24/2016 CLINICAL DATA:  Two weeks of cough and other rep sperm tori symptoms. No definite fever. EXAM: CHEST  2 VIEW COMPARISON:  Portable chest x-ray of November 23, 2016 FINDINGS: The lungs are borderline hypoinflated. The interstitial markings are increased. The pulmonary vascularity is engorged. The cardiac silhouette is enlarged. There is a small right pleural effusion layering posteriorly. There is calcification in the wall of the thoracic aorta. A ventriculoperitoneal shunt tube is present. The observed bony thorax exhibits no acute abnormality. IMPRESSION: CHF with mild pulmonary interstitial edema. Small right pleural effusion. No definite pneumonia. Thoracic aortic atherosclerosis. Electronically Signed   By: David  Martinique M.D.   On: 11/24/2016 13:40   Ct Head Wo Contrast  Result Date: 12/19/2016 CLINICAL DATA:  Encephalopathy, history  hypertension, stage IV chronic kidney disease, COPD, CHF EXAM: CT HEAD WITHOUT CONTRAST TECHNIQUE: Contiguous axial images were obtained from the base of the skull through the vertex without intravenous contrast. Patient uncooperative with imaging. Scattered mild motion artifacts degrade exam. Sagittal and coronal MPR images reconstructed from axial data set. COMPARISON:  11/18/2015 FINDINGS: Brain: VP shunt via posterior RIGHT parietal  approach with tip at RIGHT lateral ventricle. Generalized atrophy. Stable ventricular morphology. No midline shift or mass effect. Beam hardening artifacts from patient's arm/hand. Extensive small vessel chronic ischemic changes of deep cerebral white matter. Small old lacunar infarct adjacent to LEFT caudate head. No intracranial hemorrhage, mass lesion, or evidence acute infarction. No extra-axial fluid collections. Vascular: Unremarkable Skull: Grossly intact Sinuses/Orbits: Scattered mucosal thickening throughout the paranasal sinuses Other: N/A IMPRESSION: Atrophy with small vessel chronic ischemic changes of deep cerebral white matter. Stable VP shunt and ventricular morphology. 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 No acute intracranial abnormalities identified within limitations of patient motion. Electronically Signed   By: Lavonia Dana M.D.   On: 12/19/2016 09:03   Dg Chest Port 1 View  Result Date: 11/23/2016 CLINICAL DATA:  Dyspnea, CHF, hypertension, stage IV chronic kidney disease EXAM: PORTABLE CHEST 1 VIEW COMPARISON:  Portable exam 1219 hours compared to 11/23/2016 at 0604 hours FINDINGS: VP shunt tubing traverses RIGHT hemithorax. Enlargement of cardiac silhouette with pulmonary vascular congestion. Stable mediastinal contours. Atherosclerotic calcification aorta. Small RIGHT pleural effusion with questionable tiny LEFT pleural effusion. Mild RIGHT basilar opacity, improved from earlier study, question atelectasis or slightly improved edema.  Remaining lungs clear. No pneumothorax. Bones demineralized. IMPRESSION: Enlargement of cardiac silhouette with pulmonary vascular congestion. Small RIGHT pleural effusion with slightly improved aeration since the earlier study question improved atelectasis versus edema. No Electronically Signed   By: Lavonia Dana M.D.   On: 11/23/2016 12:27   Dg Chest Portable 1 View  Result Date: 11/23/2016 CLINICAL DATA:  71 year old male with shortness of breath and cough. EXAM: PORTABLE CHEST 1 VIEW COMPARISON:  Chest radiograph dated 11/21/2015 FINDINGS: There is stable cardiomegaly. Diffuse interstitial prominence and nodularity may represent mild edema. Right lung base hazy density may represent atelectasis versus infiltrate. A small right pleural effusion noted. There is no pneumothorax. A shunt catheter is noted along the right hemithorax. No acute osseous pathology identified. IMPRESSION: Small right pleural effusion with right lung base atelectasis versus infiltrate. Stable cardiomegaly with possible mild interstitial edema. Electronically Signed   By: Anner Crete M.D.   On: 11/23/2016 06:18   Dg Abd Acute W/chest  Result Date: 12/17/2016 CLINICAL DATA:  Abdominal pain today EXAM: DG ABDOMEN ACUTE W/ 1V CHEST COMPARISON:  12/15/2016 FINDINGS: The abdominal gas pattern is normal. Ventriculoperitoneal shunt tubing projects over the chest and abdomen. There are calcifications along the expected location of the pancreas which likely relate to chronic pancreatitis. The upright view of the chest demonstrates a small right pleural effusion and mild linear basilar lung opacities, as well as moderate vascular and interstitial congestive changes. Unchanged cardiomegaly. IMPRESSION: 1. No evidence of bowel obstruction or perforation. 2. Question pancreatic calcifications, suggesting chronic pancreatitis. 3. Vascular and interstitial prominence, cardiomegaly, and small right pleural effusion. This may represent mild  congestive heart failure. Electronically Signed   By: Andreas Newport M.D.   On: 12/17/2016 00:15     ELGERGAWY, DAWOOD M.D on 12/19/2016 at 1:31 PM  Between 7am to 7pm - Pager - [phone]  After 7pm go to [email or web address] - password [facility]  [facility] -  Office  [phone]

## 2016-12-19 NOTE — Progress Notes (Signed)
SLP Cancellation Note  Patient Details Name: Cameron Mendez MRN: [id number] DOB: February 26, 1946   Cancelled treatment:       Reason Eval/Treat Not Completed: Patient at procedure or test/unavailable  Verdie Barrows B. Rutherford Nail, M.S., CCC-SLP Speech-Language Pathologist   Cary Lothrop 12/19/2016, 2:51 PM

## 2016-12-19 NOTE — Progress Notes (Signed)
[facility] Progress Note   Subjective:missed HD yesterday - agitated,  not very responsive today  Vitals:   12/18/16 2131 12/19/16 0148 12/19/16 0542 12/19/16 1038  BP: (!) 163/106  132/80   Pulse: 87  62   Resp: 18  18   Temp: 97.4 F (36.3 C)     TempSrc:      SpO2: 98% 97% 91% 97%  Weight:      Height:        Inpatient medications: . allopurinol  100 mg Oral Daily  . carvedilol  12.5 mg Oral BID WC  . ceFEPime (MAXIPIME) IV  2 g Intravenous Q M,W,F-2000  . darbepoetin (ARANESP) injection - DIALYSIS  200 mcg Intravenous Q Mon-HD  . diltiazem  120 mg Oral Daily  . doxercalciferol  3 mcg Intravenous Q M,W,F-HD  . ferric gluconate (FERRLECIT/NULECIT) IV  125 mg Intravenous Q M,W,F-HD  . [START ON 12/27/2016] ferric gluconate (FERRLECIT/NULECIT) IV  62.5 mg Intravenous Q Wed-HD  . guaiFENesin  1,200 mg Oral BID  . ipratropium-albuterol  3 mL Nebulization Q6H  . LORazepam      . mouth rinse  15 mL Mouth Rinse BID  . methylPREDNISolone (SOLU-MEDROL) injection  40 mg Intravenous Q8H  . pantoprazole (PROTONIX) IV  40 mg Intravenous Q12H  . sodium chloride flush  3 mL Intravenous Q12H    sodium chloride, acetaminophen, albuterol, haloperidol lactate, haloperidol lactate, RESOURCE THICKENUP CLEAR, sodium chloride flush  Exam: Chron ill appearing, NAD Not responding to voice commands,  Chest dec'd bilat bases, diffuse exp wheezing RRR no mrg Abd soft ntnd no mass No LE edema LUA AVF Neuro is confused, nonverbal, moaning and coughs occ  D[facility]: [facility]   4h   62.5kg   2/2.25 bath   Hep none  LUA AVF  - Hect 4 ug, pth 406, P 2.5, Ca 8.7  - Mirc 225 last 1/5  - Venofer 100/hd last 1/12      Assessment: 1. HCAP/ COPD - with acute COPD exac, on abx, nebs, steroids 2. Anemia/ FOBT+ - on max esa and Fe load 3. ESRD HD MWF 4. HTN stable on metop/ dilt 5. Vol stable, follow wts  6. AMS - prob d/t acute illness; head CT 1/9- no acute intracranial  abnormalities  7. Blind d/t RP 8. 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 Having sig problems with cooperation with dialysis.  Kelly Splinter MD [facility] pager [phone]   12/19/2016, 1:55 PM       Recent Labs Lab 12/16/16 0834 12/17/16 0544 12/18/16 1042  NA 139 139 136  K 4.6 4.8 5.5*  CL 102 101 101  CO2 27 28 22   GLUCOSE 81 93 89  BUN 22* 33* 46*  CREATININE 4.26* 6.10* 8.69*  CALCIUM 8.2* 8.7* 8.7*  PHOS  --  3.7  --     Recent Labs Lab 12/16/16 1057 12/17/16 0544  AST 26  --   ALT 15*  --   ALKPHOS 67  --   BILITOT 1.3*  --   PROT 6.5  --   ALBUMIN 2.9* 3.1*    Recent Labs Lab 12/15/16 1820 12/16/16 0439 12/16/16 1819 12/17/16 0544 12/18/16 1042  WBC 2.2* 2.3* 2.9* 2.8* 3.3*  NEUTROABS 1.5*  1.5*  --   --   --   HGB 6.5* 8.0* 7.9* 8.0* 8.1*  HCT 21.0* 25.6* 25.5* 25.6* 25.7*  MCV 97.7 93.8 95.9 97.0 96.3  PLT 43* 33* 64* 55* 59*   Iron/TIBC/Ferritin/ %Sat    Component Value Date/Time   IRON 46 12/16/2016 1057   TIBC 176 (L) 12/16/2016 1057   FERRITIN 1,185 (H) 11/18/2015 0955   IRONPCTSAT 26 12/16/2016 1057   IRONPCTSAT 22 05/18/2009 1203

## 2016-12-20 DIAGNOSIS — I5032 Chronic diastolic (congestive) heart failure: Secondary | ICD-10-CM

## 2016-12-20 DIAGNOSIS — J441 Chronic obstructive pulmonary disease with (acute) exacerbation: Secondary | ICD-10-CM

## 2016-12-20 LAB — TYPE AND SCREEN
BLOOD PRODUCT EXPIRATION DATE: 201801182359
Blood Product Expiration Date: 201801182359
ISSUE DATE / TIME: 201801091518
ISSUE DATE / TIME: 201801091518
UNIT TYPE AND RH: 5100
Unit Type and Rh: 5100

## 2016-12-20 LAB — CULTURE, BLOOD (ROUTINE X 2)
CULTURE: NO GROWTH
CULTURE: NO GROWTH

## 2016-12-20 MED ORDER — HEPARIN SODIUM (PORCINE) 1000 UNIT/ML DIALYSIS
1000.0000 [IU] | INTRAMUSCULAR | Status: DC | PRN
  Filled 2016-12-20: qty 1

## 2016-12-20 MED ORDER — SODIUM CHLORIDE 0.9 % IV SOLN: 100.0000 mL | INTRAVENOUS | Status: DC | PRN

## 2016-12-20 MED ORDER — LORAZEPAM 2 MG/ML IJ SOLN
INTRAMUSCULAR | Status: AC
  Administered 2016-12-20: 1 mg via INTRAVENOUS
  Filled 2016-12-20: qty 1

## 2016-12-20 MED ORDER — LORAZEPAM 2 MG/ML IJ SOLN
1.0000 mg | Freq: Once | INTRAMUSCULAR | Status: AC
Start: 2016-12-20 — End: 2016-12-20
  Administered 2016-12-20: 1 mg via INTRAVENOUS

## 2016-12-20 MED ORDER — LIDOCAINE-PRILOCAINE 2.5-2.5 % EX CREA
1.0000 "application " | TOPICAL_CREAM | CUTANEOUS | Status: DC | PRN
  Filled 2016-12-20: qty 5

## 2016-12-20 MED ORDER — IPRATROPIUM-ALBUTEROL 0.5-2.5 (3) MG/3ML IN SOLN
3.0000 mL | Freq: Three times a day (TID) | RESPIRATORY_TRACT | Status: DC
  Administered 2016-12-20 – 2016-12-21 (×3): 3 mL via RESPIRATORY_TRACT
  Filled 2016-12-20 (×4): qty 3

## 2016-12-20 MED ORDER — PANTOPRAZOLE SODIUM 40 MG PO TBEC
40.0000 mg | DELAYED_RELEASE_TABLET | Freq: Two times a day (BID) | ORAL | Status: DC
  Filled 2016-12-20: qty 1

## 2016-12-20 MED ORDER — PENTAFLUOROPROP-TETRAFLUOROETH EX AERO: 1.0000 "application " | INHALATION_SPRAY | CUTANEOUS | Status: DC | PRN

## 2016-12-20 MED ORDER — ALTEPLASE 2 MG IJ SOLR: 2.0000 mg | Freq: Once | INTRAMUSCULAR | Status: DC | PRN

## 2016-12-20 MED ORDER — LIDOCAINE HCL (PF) 1 % IJ SOLN
5.0000 mL | INTRAMUSCULAR | Status: DC | PRN
  Filled 2016-12-20: qty 5

## 2016-12-20 NOTE — Progress Notes (Signed)
Wife called and asked for updates. Wife stated she is on jury duty and to call patient's sister or daughter for updates/discharge information today.

## 2016-12-20 NOTE — Evaluation (Addendum)
Physical Therapy Evaluation Patient Details Name: Cameron Mendez MRN: [id number] DOB: [birth year] Today's Date: 12/20/2016   History of Present Illness  Pt is a 71 y/o male with PMH significant for ESRD on HD, COPD, CHF, and parox Afib admitted from HD with anemia/GI bleed.   Clinical Impression  Pt admitted with above diagnosis presents with decreased strength, activity tolerance, balance, coordination, and cognition.  PT instructed pt in bed mobility, transfers, and sitting balance activity during self feeding task at EOB.  Pt requires initial hand over hand assist for self feeding, fade to supervision verbal cues.  Pt with delayed response to orientation to person question and then perseverative on "Ho[place]" when asked about birth date, place, and situation.  Would benefit from skilled acute PT in order to safely advance functional mobility and strength and decrease fall risk in preparation for next level of care.    Follow Up Recommendations SNF;Supervision/Assistance - 24 hour    Equipment Recommendations  None recommended by PT    Recommendations for Other Services       Precautions / Restrictions Precautions Precautions: Fall Precaution Comments: pt is blind Restrictions Weight Bearing Restrictions: No      Mobility  Bed Mobility Overal bed mobility: Needs Assistance Bed Mobility: Supine to Sit;Sit to Supine     Supine to sit: Mod assist Sit to supine: Mod assist   General bed mobility comments: Requires assist to bring trunk to sitting position and to lift BLEs into bed at end of session  Transfers Overall transfer level: Needs assistance Equipment used: 1 person hand held assist Transfers: Sit to/from Stand Sit to Stand: Min assist            Ambulation/Gait                Stairs            Wheelchair Mobility    Modified Rankin (Stroke Patients Only)       Balance Overall balance assessment: Needs assistance Sitting-balance support:  Single extremity supported;No upper extremity supported;Feet supported Sitting balance-Leahy Scale: Good     Standing balance support: Single extremity supported Standing balance-Leahy Scale: Poor                               Pertinent Vitals/Pain Pain Assessment: Faces Faces Pain Scale: No hurt    Home Living Family/patient expects to be discharged to:: Skilled nursing facility Living Arrangements: Other (Comment)             Home Equipment: Gilford Rile - 2 wheels;Cane - single point;Wheelchair - manual      Prior Function Level of Independence: Independent with assistive device(s);Needs assistance (unsure)         Comments: Pt. wife not availabe to answer question and pt. not able. Pt. would appear to have needed 24 hours S at home.     Hand Dominance        Extremity/Trunk Assessment   Upper Extremity Assessment Upper Extremity Assessment: Defer to OT evaluation    Lower Extremity Assessment Lower Extremity Assessment: Generalized weakness    Cervical / Trunk Assessment Cervical / Trunk Assessment: Kyphotic  Communication   Communication: Expressive difficulties;Receptive difficulties;Other (comment) (perseverative)  Cognition Arousal/Alertness: Awake/alert Behavior During Therapy: Flat affect;WFL for tasks assessed/performed Overall Cognitive Status: Impaired/Different from baseline Area of Impairment: Orientation;Attention;Memory;Following commands;Safety/judgement;Awareness;Problem solving Orientation Level: Place;Time;Situation Current Attention Level: Sustained Memory: Decreased short-term memory Following Commands: Follows one step commands  with increased time Safety/Judgement: Decreased awareness of safety;Decreased awareness of deficits Awareness: Emergent;Anticipatory;Intellectual Problem Solving: Slow processing;Decreased initiation;Difficulty sequencing;Requires verbal cues;Requires tactile cues      General Comments       Exercises     Assessment/Plan    PT Assessment Patient needs continued PT services  PT Problem List Decreased strength;Decreased activity tolerance;Decreased balance;Decreased mobility;Decreased cognition;Decreased knowledge of use of DME;Decreased safety awareness;Cardiopulmonary status limiting activity;Decreased coordination          PT Treatment Interventions DME instruction;Gait training;Functional mobility training;Therapeutic activities;Therapeutic exercise;Balance training;Cognitive remediation;Patient/family education    PT Goals (Current goals can be found in the Care Plan section)  Acute Rehab PT Goals Patient Stated Goal: none stated PT Goal Formulation: Patient unable to participate in goal setting Time For Goal Achievement: 12/27/16 Potential to Achieve Goals: Fair    Frequency Min 2X/week   Barriers to discharge Inaccessible home environment;Decreased caregiver support      Co-evaluation               End of Session   Activity Tolerance: Patient tolerated treatment well Patient left: in bed;with call bell/phone within reach;with bed alarm set Nurse Communication: Mobility status         Time: 1310-1347 PT Time Calculation (min) (ACUTE ONLY): 37 min   Charges:   PT Evaluation $PT Eval Low Complexity: 1 Procedure PT Treatments $Therapeutic Activity: 8-22 mins   PT G Codes:        Donnella Morford E Penven-Crew 12/20/2016, 2:36 PM

## 2016-12-20 NOTE — Progress Notes (Signed)
Speech Language Pathology Treatment: Dysphagia  Patient Details Name: Cameron Mendez MRN: [id number] DOB: 09/29/1946 Today's Date: 12/20/2016 Time: 1005-1020 SLP Time Calculation (min) (ACUTE ONLY): 15 min  Assessment / Plan / Recommendation Clinical Impression  Pts mentation much improved since prior SLp attempts, however, pt responses still delayed and cueing needed. Attempted a variety of trials to assess potential for upgraded diet texture. Pt tolerates purees and honey thick liquids well and potentially nectar thick liquids, though signs of aspiration persist with thin liquids. Oral manipulation of liquids is slow and possibly incomplete, swallow appears delayed. Recommend pt continue current diet and f/u with MBS tomorrow. Attempted Monday, but pt was too agitated.    HPI HPI: Cameron Newbill Smithis a 71 y.o.malewith medical history significant for end-stage renal disease on hemodialysis, COPD, chronic diastolic CHF, paroxysmal atrial fibrillation, chronic thrombocytopenia, and anemia who presents to the emergency department from his dialysis center for evaluation of generalized weakness, confusion, cough, and acute on chronic anemia. Patient reports that he had been in his usual state of health until approximately 2-3 days ago when he developed shortness of breath and cough. Since that time, the cough and dyspnea have worsened, and had become accompanied by generalized weakness and fatigue. Patient denies any fevers or chills and denies any chest pain or palpitations. Patient denies any nausea, vomiting, or diarrhea. He is blind and unable to comment on the presence of melena or hematochezia. Basic lab work was obtained at the dialysis center today and his hemoglobin was reportedly low at 7.3, down 2 g from a recent prior. He was directed to the emergency department for further evaluation of this. He was reportedly confused upon arrival for low of EMS, but is fully oriented in the ED. Of note, the  patient had MBS dated 09/18/2015 with recommendation for a dysphagia 2 diet with honey thick liquids.  The patient was unable to elaborate on any ST treatment or recommendation for his diet to be advanced.  Most recent chest xray is showing patchy right basilar opacity ? infiltrate as well as small right pleural effusion.        SLP Plan        Recommendations  Diet recommendations: Dysphagia 1 (puree);Honey-thick liquid Liquids provided via: Cup Medication Administration: Crushed with puree Supervision: Patient able to self feed Compensations: Slow rate;Small sips/bites Postural Changes and/or Swallow Maneuvers: Seated upright 90 degrees;Upright 30-60 min after meal                        GO               Herbie Baltimore, MA CCC-SLP [phone]  Lynann Beaver 12/20/2016, 1:12 PM

## 2016-12-20 NOTE — Progress Notes (Signed)
PT will need to see patient before insurance auth can be completed for snf.  Percell Locus Graiden Henes LCSWA [phone]

## 2016-12-20 NOTE — Progress Notes (Signed)
PROGRESS NOTE                                                                                                                                                                                                             Patient Demographics:    Cameron Mendez, is a 71 y.o. male, DOB - 1946/07/14, [id number]  Admit date - 12/15/2016   Admitting Physician Vianne Bulls, MD  Outpatient Primary MD for the patient is [name], MD  LOS - 5  Outpatient Specialists: renal  Chief Complaint  Patient presents with  . Abnormal Lab       Brief Narrative   71 year old male with history of incisional disease on dialysis, COPD, chronic-CHF, possible A. fib, chronic, cytopenia and anemia resident of SNF sent from dialysis with generalized weakness, confusion, cough and acute on chronic anemia. He received 1 unit PRBC and H&H has been stable. Being managed for healthcare associated pneumonia and COPD exacerbation.  Subjective:   No overnight issues. Patient is more alert and answering few questions today. Has not been agitated.   Assessment  & Plan :    Principal Problem:   Symptomatic anemia FOBT positive. Baseline hemoglobin of 7-8 (was 6.5 on presentation). Stool for Hemoccult negative, however H&H stable after 1 unit PRBC. EGD in March 2016 showed chronic gastritis with DuoNeb all information and polyp in cardia. Coloscopy done at that time showed mild ascending colonic diverticulosis with moderate sigmoid diverticulosis and multiple polyps that were snared. -Continue PPI. Holding his aspirin. -Also has anemia of chronic disease and iron deficiency. He is on iron and is supporting.  Active Problems:   Acute encephalopathy Suspected due to healthcare associated pneumonia. Patient had some confusion on presentation but was agitated on 1/8. Also contributed by acute delirium and sundowning. Required Haldol when necessary and  also diffuse dialysis. -Mentation improving now and patient compliant. Discussed with renal appears that patient does get encephalopathic in a similar manner when he has infection.   Healthcare associated pneumonia Right basilar infiltrate on chest x-ray. Antibiotic narrowed to cefepime. Afebrile and symptoms improved.    COPD exacerbation ([place]) On low-dose steroid for wheezing. Continue DuoNeb's.    Essential hypertension, benign Stable. Continue medications    Thrombocytopenia (HCC) Chronic. No signs of hemolysis on peripheral smear. Appreciate hematology input. Possibly due to anemia  find disease and acute illness. Transfused platelets.  Peripheral smear reviewed by hematologist. Negative for hemolysis. HIV antibody negative.    ESRD on hemodialysis [facility]) Dialysis as scheduled.    Paroxysmal atrial fibrillation (HCC) In sinus. Not on anticoagulant edition due to chronic, cytopenia with history of GI bleed. Continue Cardizem.    Essential hypertension Continue beta blocker   Chronic diastolic CHF Volume managed with dialysis   1.    Code Status : Full code  Family Communication  : None at bedside  Disposition Plan  : Return to SNF tomorrow if continues to improve  Barriers For Discharge : Improving symptoms  Consults  :   Renal Hematology  Procedures  : None  DVT Prophylaxis  : SCDs  Lab Results  Component Value Date   PLT 69 (L) 12/19/2016    Antibiotics  :    Anti-infectives    Start     Dose/Rate Route Frequency Ordered Stop   12/18/16 2000  ceFEPIme (MAXIPIME) 2 g in dextrose 5 % 50 mL IVPB     2 g 100 mL/hr over 30 Minutes Intravenous Every M-W-F (2000) 12/15/16 2026     12/18/16 1200  vancomycin (VANCOCIN) IVPB 750 mg/150 ml premix  Status:  Discontinued     750 mg 150 mL/hr over 60 Minutes Intravenous Every M-W-F (Hemodialysis) 12/15/16 1954 12/17/16 1220   12/15/16 2000  vancomycin (VANCOCIN) 1,500 mg in sodium chloride 0.9 % 500 mL IVPB       1,500 mg 250 mL/hr over 120 Minutes Intravenous  Once 12/15/16 1954 12/16/16 0413   12/15/16 1945  ceFEPIme (MAXIPIME) 2 g in dextrose 5 % 50 mL IVPB     2 g 100 mL/hr over 30 Minutes Intravenous  Once 12/15/16 1940 12/15/16 2053        Objective:   Vitals:   12/19/16 2322 12/20/16 0548 12/20/16 0746 12/20/16 1353  BP: (!) 155/68 (!) 163/69  (!) 142/68  Pulse: 87 79  69  Resp: 17 16  18   Temp: 98 F (36.7 C) 97.4 F (36.3 C)  97.2 F (36.2 C)  TempSrc: Oral Oral    SpO2: 93% 96% 98% 100%  Weight:      Height:        Wt Readings from Last 3 Encounters:  12/19/16 64.6 kg (142 lb 6.7 oz)  12/08/16 63 kg (139 lb)  11/30/16 63 kg (139 lb)     Intake/Output Summary (Last 24 hours) at 12/20/16 1639 Last data filed at 12/19/16 1657  Gross per 24 hour  Intake              335 ml  Output             3000 ml  Net            -2665 ml     Physical Exam  Gen: not in distress HEENT: Pallor present, moist mucosa, supple neck Chest: clear b/l, no added sounds CVS: N S1&S2, no murmurs, rubs or gallop GI: soft, NT, ND, BS+ Musculoskeletal: warm, no edema CNS: Alert and oriented 1-2. Nonfocal    Data Review:    CBC  Recent Labs Lab 12/15/16 1820 12/16/16 0439 12/16/16 1819 12/17/16 0544 12/18/16 1042 12/19/16 1333  WBC 2.2* 2.3* 2.9* 2.8* 3.3* 4.2  HGB 6.5* 8.0* 7.9* 8.0* 8.1* 7.1*  HCT 21.0* 25.6* 25.5* 25.6* 25.7* 22.6*  PLT 43* 33* 64* 55* 59* 69*  MCV 97.7 93.8 95.9 97.0 96.3 95.8  MCH  30.2 29.3 29.7 30.3 30.3 30.1  MCHC 31.0 31.3 31.0 31.3 31.5 31.4  RDW 17.8* 18.4* 19.0* 18.9* 18.3* 18.6*  LYMPHSABS 0.5* 0.6*  --   --   --   --   MONOABS 0.2 0.3  --   --   --   --   EOSABS 0.0 0.0  --   --   --   --   BASOSABS 0.0 0.0  --   --   --   --     Chemistries   Recent Labs Lab 12/15/16 1820 12/16/16 0834 12/16/16 1057 12/17/16 0544 12/18/16 1042 12/19/16 1937  NA 141 139  --  139 136 135  K 3.2* 4.6  --  4.8 5.5* 4.9  CL 100* 102  --  101 101  97*  CO2 31 27  --  28 22 23   GLUCOSE 113* 81  --  93 89 92  BUN 13 22*  --  33* 46* 22*  CREATININE 3.02* 4.26*  --  6.10* 8.69* 4.92*  CALCIUM 8.3* 8.2*  --  8.7* 8.7* 8.9  MG  --  1.8  --   --   --   --   AST  --   --  26  --   --   --   ALT  --   --  15*  --   --   --   ALKPHOS  --   --  67  --   --   --   BILITOT  --   --  1.3*  --   --   --    ------------------------------------------------------------------------------------------------------------------ No results for input(s): CHOL, HDL, LDLCALC, TRIG, CHOLHDL, LDLDIRECT in the last 72 hours.  No results found for: HGBA1C ------------------------------------------------------------------------------------------------------------------ No results for input(s): TSH, T4TOTAL, T3FREE, THYROIDAB in the last 72 hours.  Invalid input(s): FREET3 ------------------------------------------------------------------------------------------------------------------ No results for input(s): VITAMINB12, FOLATE, FERRITIN, TIBC, IRON, RETICCTPCT in the last 72 hours.  Coagulation profile No results for input(s): INR, PROTIME in the last 168 hours.  No results for input(s): DDIMER in the last 72 hours.  Cardiac Enzymes No results for input(s): CKMB, TROPONINI, MYOGLOBIN in the last 168 hours.  Invalid input(s): CK ------------------------------------------------------------------------------------------------------------------    Component Value Date/Time   BNP 2,264.3 (H) 11/18/2015 [id number]    Inpatient Medications  Scheduled Meds: . sodium chloride   Intravenous Once  . sodium chloride   Intravenous Once  . allopurinol  100 mg Oral Daily  . carvedilol  12.5 mg Oral BID WC  . ceFEPime (MAXIPIME) IV  2 g Intravenous Q M,W,F-2000  . darbepoetin (ARANESP) injection - DIALYSIS  200 mcg Intravenous Q Mon-HD  . diltiazem  120 mg Oral Daily  . doxercalciferol  3 mcg Intravenous Q M,W,F-HD  . ferric gluconate (FERRLECIT/NULECIT) IV  125  mg Intravenous Q M,W,F-HD  . [START ON 12/27/2016] ferric gluconate (FERRLECIT/NULECIT) IV  62.5 mg Intravenous Q Wed-HD  . guaiFENesin  1,200 mg Oral BID  . ipratropium-albuterol  3 mL Nebulization TID  . LORazepam  1 mg Intravenous Once  . mouth rinse  15 mL Mouth Rinse BID  . methylPREDNISolone (SOLU-MEDROL) injection  40 mg Intravenous Q8H  . pantoprazole  40 mg Oral BID  . sodium chloride flush  3 mL Intravenous Q12H   Continuous Infusions: PRN Meds:.sodium chloride, acetaminophen, albuterol, haloperidol lactate, haloperidol lactate, hydrALAZINE, RESOURCE THICKENUP CLEAR, sodium chloride flush  Micro Results Recent Results (from the past 240 hour(s))  Respiratory Panel by PCR  Status: None   Collection Time: 12/15/16  1:30 AM  Result Value Ref Range Status   Adenovirus NOT DETECTED NOT DETECTED Final   Coronavirus 229E NOT DETECTED NOT DETECTED Final   Coronavirus HKU1 NOT DETECTED NOT DETECTED Final   Coronavirus NL63 NOT DETECTED NOT DETECTED Final   Coronavirus OC43 NOT DETECTED NOT DETECTED Final   Metapneumovirus NOT DETECTED NOT DETECTED Final   Rhinovirus / Enterovirus NOT DETECTED NOT DETECTED Final   Influenza A NOT DETECTED NOT DETECTED Final   Influenza B NOT DETECTED NOT DETECTED Final   Parainfluenza Virus 1 NOT DETECTED NOT DETECTED Final   Parainfluenza Virus 2 NOT DETECTED NOT DETECTED Final   Parainfluenza Virus 3 NOT DETECTED NOT DETECTED Final   Parainfluenza Virus 4 NOT DETECTED NOT DETECTED Final   Respiratory Syncytial Virus NOT DETECTED NOT DETECTED Final   Bordetella pertussis NOT DETECTED NOT DETECTED Final   Chlamydophila pneumoniae NOT DETECTED NOT DETECTED Final   Mycoplasma pneumoniae NOT DETECTED NOT DETECTED Final  Culture, blood (routine x 2) Call MD if unable to obtain prior to antibiotics being given     Status: None   Collection Time: 12/15/16  8:19 PM  Result Value Ref Range Status   Specimen Description BLOOD RIGHT ARM  Final    Special Requests BOTTLES DRAWN AEROBIC AND ANAEROBIC 5ML  Final   Culture NO GROWTH 5 DAYS  Final   Report Status 12/20/2016 FINAL  Final  Culture, blood (routine x 2) Call MD if unable to obtain prior to antibiotics being given     Status: None   Collection Time: 12/15/16  8:24 PM  Result Value Ref Range Status   Specimen Description BLOOD RIGHT HAND  Final   Special Requests IN PEDIATRIC BOTTLE 1ML  Final   Culture NO GROWTH 5 DAYS  Final   Report Status 12/20/2016 FINAL  Final  MRSA PCR Screening     Status: None   Collection Time: 12/16/16 12:33 AM  Result Value Ref Range Status   MRSA by PCR NEGATIVE NEGATIVE Final    Comment:        The GeneXpert MRSA Assay (FDA approved for NASAL specimens only), is one component of a comprehensive MRSA colonization surveillance program. It is not intended to diagnose MRSA infection nor to guide or monitor treatment for MRSA infections.     Radiology Reports Ct Abdomen Pelvis Wo Contrast  Result Date: 12/17/2016 CLINICAL DATA:  Abdominal pain and fatigue EXAM: CT ABDOMEN AND PELVIS WITHOUT CONTRAST TECHNIQUE: Multidetector CT imaging of the abdomen and pelvis was performed following the standard protocol without IV contrast. COMPARISON:  12/16/2016 FINDINGS: Lower chest: Small bilateral pleural effusions are noted. Bilateral lower lobe consolidation is noted right greater than left. Hepatobiliary: The liver is within normal limits. The gallbladder is well distended with dependent gallstones. No biliary obstructive changes are seen. Pancreas: Unremarkable. No pancreatic ductal dilatation or surrounding inflammatory changes. Spleen: Spleen is enlarged when compared with the prior exam and demonstrates at least 2 hypodense lesions which are poorly characterized on this noncontrast study. These were not present on the prior exam Adrenals/Urinary Tract: The adrenal glands are within normal limits bilaterally. Bilateral nonobstructing renal stones are  seen. Diffuse renal vascular calcifications are noted. Scattered cystic lesions are noted in both kidneys. No obstructive changes are seen. The bladder is decompressed. Stomach/Bowel: The appendix is well visualized. No inflammatory changes are seen. Diverticular change of the colon is noted without diverticulitis. Vascular/Lymphatic: Heavy atherosclerotic calcifications of the  aorta are noted without aneurysmal dilatation. No significant lymphatic abnormality is seen. Reproductive: Prostate is unremarkable. Other: 2 catheters extending into the peritoneal cavity are noted consistent with ventriculoperitoneal shunts. Some mild free fluid is noted within the abdomen likely related to these catheters. Musculoskeletal: Degenerative changes of the lumbar spine are seen. No acute bony abnormality is noted. IMPRESSION: Hypodense lesions within the spleen which are incompletely characterized on this exam. Nonemergent outpatient MRI evaluation is recommended when the patient's condition improves to allow for optimum characterization. Bilateral pleural effusions and bibasilar consolidation. Mild free fluid within the abdomen related to ventricular shunt catheters. Nonobstructing renal calculi bilaterally. Electronically Signed   By: Inez Catalina M.D.   On: 12/17/2016 11:25   Dg Chest 2 View  Result Date: 12/15/2016 CLINICAL DATA:  Initial evaluation for acute cough, generalized weakness. EXAM: CHEST  2 VIEW COMPARISON:  Prior radiograph from 11/24/2016. FINDINGS: Cardiomegaly, stable from prior. Mediastinal silhouette within normal limits. Aortic atherosclerosis noted. Lungs mildly hypoinflated. Shunt catheter tubing overlies the thorax. Diffuse vascular congestion without overt pulmonary edema. Small right pleural effusion. Patchy right basilar opacity, suspicious for possible infiltrate given the provided history. No pneumothorax. No acute osseous abnormality. IMPRESSION: 1. Patchy right basilar opacity, suspicious for  possible infiltrate given the provided history of cough. 2. Stable cardiomegaly with diffuse pulmonary vascular congestion without overt pulmonary edema. 3. Small right pleural effusion. 4. Aortic atherosclerosis. Electronically Signed   By: Jeannine Boga M.D.   On: 12/15/2016 18:41   Dg Chest 2 View  Result Date: 11/24/2016 CLINICAL DATA:  Two weeks of cough and other rep sperm tori symptoms. No definite fever. EXAM: CHEST  2 VIEW COMPARISON:  Portable chest x-ray of November 23, 2016 FINDINGS: The lungs are borderline hypoinflated. The interstitial markings are increased. The pulmonary vascularity is engorged. The cardiac silhouette is enlarged. There is a small right pleural effusion layering posteriorly. There is calcification in the wall of the thoracic aorta. A ventriculoperitoneal shunt tube is present. The observed bony thorax exhibits no acute abnormality. IMPRESSION: CHF with mild pulmonary interstitial edema. Small right pleural effusion. No definite pneumonia. Thoracic aortic atherosclerosis. Electronically Signed   By: David  Martinique M.D.   On: 11/24/2016 13:40   Ct Head Wo Contrast  Result Date: 12/19/2016 CLINICAL DATA:  Encephalopathy, history hypertension, stage IV chronic kidney disease, COPD, CHF EXAM: CT HEAD WITHOUT CONTRAST TECHNIQUE: Contiguous axial images were obtained from the base of the skull through the vertex without intravenous contrast. Patient uncooperative with imaging. Scattered mild motion artifacts degrade exam. Sagittal and coronal MPR images reconstructed from axial data set. COMPARISON:  11/18/2015 FINDINGS: Brain: VP shunt via posterior RIGHT parietal approach with tip at RIGHT lateral ventricle. Generalized atrophy. Stable ventricular morphology. No midline shift or mass effect. Beam hardening artifacts from patient's arm/hand. Extensive small vessel chronic ischemic changes of deep cerebral white matter. Small old lacunar infarct adjacent to LEFT caudate head.  No intracranial hemorrhage, mass lesion, or evidence acute infarction. No extra-axial fluid collections. Vascular: Unremarkable Skull: Grossly intact Sinuses/Orbits: Scattered mucosal thickening throughout the paranasal sinuses Other: N/A IMPRESSION: Atrophy with small vessel chronic ischemic changes of deep cerebral white matter. Stable VP shunt and ventricular morphology. 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 No acute intracranial abnormalities identified within limitations of patient motion. Electronically Signed   By: Lavonia Dana M.D.   On: 12/19/2016 09:03   Dg Chest Port 1 View  Result Date: 11/23/2016 CLINICAL DATA:  Dyspnea, CHF, hypertension, stage  IV chronic kidney disease EXAM: PORTABLE CHEST 1 VIEW COMPARISON:  Portable exam 1219 hours compared to 11/23/2016 at 0604 hours FINDINGS: VP shunt tubing traverses RIGHT hemithorax. Enlargement of cardiac silhouette with pulmonary vascular congestion. Stable mediastinal contours. Atherosclerotic calcification aorta. Small RIGHT pleural effusion with questionable tiny LEFT pleural effusion. Mild RIGHT basilar opacity, improved from earlier study, question atelectasis or slightly improved edema. Remaining lungs clear. No pneumothorax. Bones demineralized. IMPRESSION: Enlargement of cardiac silhouette with pulmonary vascular congestion. Small RIGHT pleural effusion with slightly improved aeration since the earlier study question improved atelectasis versus edema. No Electronically Signed   By: Lavonia Dana M.D.   On: 11/23/2016 12:27   Dg Chest Portable 1 View  Result Date: 11/23/2016 CLINICAL DATA:  71 year old male with shortness of breath and cough. EXAM: PORTABLE CHEST 1 VIEW COMPARISON:  Chest radiograph dated 11/21/2015 FINDINGS: There is stable cardiomegaly. Diffuse interstitial prominence and nodularity may represent mild edema. Right lung base hazy density may represent atelectasis versus infiltrate. A small right pleural effusion  noted. There is no pneumothorax. A shunt catheter is noted along the right hemithorax. No acute osseous pathology identified. IMPRESSION: Small right pleural effusion with right lung base atelectasis versus infiltrate. Stable cardiomegaly with possible mild interstitial edema. Electronically Signed   By: Anner Crete M.D.   On: 11/23/2016 06:18   Dg Abd Acute W/chest  Result Date: 12/17/2016 CLINICAL DATA:  Abdominal pain today EXAM: DG ABDOMEN ACUTE W/ 1V CHEST COMPARISON:  12/15/2016 FINDINGS: The abdominal gas pattern is normal. Ventriculoperitoneal shunt tubing projects over the chest and abdomen. There are calcifications along the expected location of the pancreas which likely relate to chronic pancreatitis. The upright view of the chest demonstrates a small right pleural effusion and mild linear basilar lung opacities, as well as moderate vascular and interstitial congestive changes. Unchanged cardiomegaly. IMPRESSION: 1. No evidence of bowel obstruction or perforation. 2. Question pancreatic calcifications, suggesting chronic pancreatitis. 3. Vascular and interstitial prominence, cardiomegaly, and small right pleural effusion. This may represent mild congestive heart failure. Electronically Signed   By: Andreas Newport M.D.   On: 12/17/2016 00:15    Time Spent in minutes  25   Louellen Molder M.D on 12/20/2016 at 4:39 PM  Between 7am to 7pm - Pager - [phone]  After 7pm go to [email or web address] - password [facility]  [facility] -  Office  [phone]

## 2016-12-20 NOTE — Progress Notes (Signed)
[facility] Progress Note   Subjective:much more alert and responsive today  Vitals:   12/19/16 2122 12/19/16 2322 12/20/16 0548 12/20/16 0746  BP:  (!) 155/68 (!) 163/69   Pulse: 84 87 79   Resp: 18 17 16    Temp:  98 F (36.7 C) 97.4 F (36.3 C)   TempSrc:  Oral Oral   SpO2: 100% 93% 96% 98%  Weight:      Height:        Inpatient medications: . sodium chloride   Intravenous Once  . sodium chloride   Intravenous Once  . allopurinol  100 mg Oral Daily  . carvedilol  12.5 mg Oral BID WC  . ceFEPime (MAXIPIME) IV  2 g Intravenous Q M,W,F-2000  . darbepoetin (ARANESP) injection - DIALYSIS  200 mcg Intravenous Q Mon-HD  . diltiazem  120 mg Oral Daily  . doxercalciferol  3 mcg Intravenous Q M,W,F-HD  . ferric gluconate (FERRLECIT/NULECIT) IV  125 mg Intravenous Q M,W,F-HD  . [START ON 12/27/2016] ferric gluconate (FERRLECIT/NULECIT) IV  62.5 mg Intravenous Q Wed-HD  . guaiFENesin  1,200 mg Oral BID  . ipratropium-albuterol  3 mL Nebulization TID  . LORazepam  1 mg Intravenous Once  . mouth rinse  15 mL Mouth Rinse BID  . methylPREDNISolone (SOLU-MEDROL) injection  40 mg Intravenous Q8H  . pantoprazole (PROTONIX) IV  40 mg Intravenous Q12H  . sodium chloride flush  3 mL Intravenous Q12H    sodium chloride, acetaminophen, albuterol, haloperidol lactate, haloperidol lactate, hydrALAZINE, RESOURCE THICKENUP CLEAR, sodium chloride flush  Exam: Chron ill appearing, NAD, alert and follows commands today Chest occ exp wheeze, mostly clear, improved RRR no mrg Abd soft ntnd no mass No LE edema LUA AVF Neuro is sitting up in bed, responds appropriately  Dialysis: [facility]   4h   62.5kg   2/2.25 bath   Hep none  LUA AVF  - Hect 4 ug, pth 406, P 2.5, Ca 8.7  - Mirc 225 last 1/5  - Venofer 100/hd last 1/12      Assessment: 1. HCAP/ COPD - with acute COPD exac, on abx, nebs, steroids. Better.  2. AMS / agitation - much better after a full HD yest 3. Anemia/  FOBT+ - on max esa and Fe load 4. ESRD HD MWF 5. HTN stable on metop/ dilt 6. Vol stable, follow wts  7. AMS - prob d/t acute illness; head CT 1/9- no acute intracranial abnormalities  8. Blind d/t RP 9. 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  Plan - short HD again today to get back on sched, UF to dry wt  Kelly Splinter MD [facility] pgr [phone]   12/20/2016, 10:44 AM           Recent Labs Lab 12/17/16 0544 12/18/16 1042 12/19/16 1937  NA 139 136 135  K 4.8 5.5* 4.9  CL 101 101 97*  CO2 28 22 23   GLUCOSE 93 89 92  BUN 33* 46* 22*  CREATININE 6.10* 8.69* 4.92*  CALCIUM 8.7* 8.7* 8.9  PHOS 3.7  --   --     Recent Labs Lab 12/16/16 1057 12/17/16 0544  AST 26  --   ALT 15*  --   ALKPHOS 67  --   BILITOT 1.3*  --   PROT 6.5  --   ALBUMIN 2.9* 3.1*    Recent Labs Lab 12/15/16 1820 12/16/16 0439  12/17/16 0544 12/18/16 1042 12/19/16 1333  WBC 2.2* 2.3*  < > 2.8* 3.3* 4.2  NEUTROABS 1.5* 1.5*  --   --   --   --   HGB 6.5* 8.0*  < > 8.0* 8.1* 7.1*  HCT 21.0* 25.6*  < > 25.6* 25.7* 22.6*  MCV 97.7 93.8  < > 97.0 96.3 95.8  PLT 43* 33*  < > 55* 59* 69*  < > = values in this interval not displayed. Iron/TIBC/Ferritin/ %Sat    Component Value Date/Time   IRON 46 12/16/2016 1057   TIBC 176 (L) 12/16/2016 1057   FERRITIN 1,185 (H) 11/18/2015 0955   IRONPCTSAT 26 12/16/2016 1057   IRONPCTSAT 22 05/18/2009 1203

## 2016-12-21 ENCOUNTER — Inpatient Hospital Stay (HOSPITAL_COMMUNITY): Payer: Medicare Other

## 2016-12-21 DIAGNOSIS — I480 Paroxysmal atrial fibrillation: Secondary | ICD-10-CM

## 2016-12-21 DIAGNOSIS — J189 Pneumonia, unspecified organism: Secondary | ICD-10-CM

## 2016-12-21 DIAGNOSIS — K922 Gastrointestinal hemorrhage, unspecified: Secondary | ICD-10-CM

## 2016-12-21 DIAGNOSIS — E876 Hypokalemia: Secondary | ICD-10-CM

## 2016-12-21 DIAGNOSIS — G9340 Encephalopathy, unspecified: Secondary | ICD-10-CM

## 2016-12-21 MED ORDER — DOXERCALCIFEROL 4 MCG/2ML IV SOLN
INTRAVENOUS | Status: AC
  Administered 2016-12-20: 3 ug via INTRAVENOUS
  Filled 2016-12-21: qty 2

## 2016-12-21 MED ORDER — IPRATROPIUM-ALBUTEROL 0.5-2.5 (3) MG/3ML IN SOLN: 3.0000 mL | Freq: Four times a day (QID) | RESPIRATORY_TRACT | Status: DC | PRN

## 2016-12-21 MED ORDER — PANTOPRAZOLE SODIUM 40 MG PO TBEC
40.0000 mg | DELAYED_RELEASE_TABLET | Freq: Two times a day (BID) | ORAL | Status: DC
  Administered 2016-12-21: 40 mg via ORAL
  Filled 2016-12-21: qty 1

## 2016-12-21 MED ORDER — PANTOPRAZOLE SODIUM 40 MG PO TBEC: 40.0000 mg | DELAYED_RELEASE_TABLET | Freq: Every day | ORAL | 0 refills | Status: AC

## 2016-12-21 MED ORDER — PREDNISONE 20 MG PO TABS: ORAL_TABLET | ORAL | 0 refills | Status: DC

## 2016-12-21 NOTE — Progress Notes (Signed)
Patient will DC to: [facility] Anticipated DC date: 12/21/16 Family notified: Wife Transport by: PTAR 6:30pm   Per MD patient ready for DC to [facility]. RN, patient, patient's family, and facility notified of DC. Discharge Summary sent to facility. RN given number for report. DC packet on chart. Ambulance transport requested for patient.   CSW signing off.  Cedric Fishman, [place] Social Worker [phone]

## 2016-12-21 NOTE — Progress Notes (Signed)
[facility] Progress Note   Subjective: at with assistance of the tech, no new c/o  Vitals:   12/21/16 0554 12/21/16 0840 12/21/16 1138 12/21/16 1140  BP: (!) 162/52  (!) 169/54 (!) 142/60  Pulse: 77  80   Resp: 18     Temp: 97.9 F (36.6 C)     TempSrc: Oral     SpO2: 95% 94% 100%   Weight:      Height:        Inpatient medications: . sodium chloride   Intravenous Once  . sodium chloride   Intravenous Once  . allopurinol  100 mg Oral Daily  . carvedilol  12.5 mg Oral BID WC  . ceFEPime (MAXIPIME) IV  2 g Intravenous Q M,W,F-2000  . darbepoetin (ARANESP) injection - DIALYSIS  200 mcg Intravenous Q Mon-HD  . diltiazem  120 mg Oral Daily  . doxercalciferol  3 mcg Intravenous Q M,W,F-HD  . ferric gluconate (FERRLECIT/NULECIT) IV  125 mg Intravenous Q M,W,F-HD  . [START ON 12/27/2016] ferric gluconate (FERRLECIT/NULECIT) IV  62.5 mg Intravenous Q Wed-HD  . guaiFENesin  1,200 mg Oral BID  . LORazepam  1 mg Intravenous Once  . mouth rinse  15 mL Mouth Rinse BID  . methylPREDNISolone (SOLU-MEDROL) injection  40 mg Intravenous Q8H  . pantoprazole  40 mg Oral BID  . sodium chloride flush  3 mL Intravenous Q12H    sodium chloride, acetaminophen, haloperidol lactate, haloperidol lactate, hydrALAZINE, ipratropium-albuterol, RESOURCE THICKENUP CLEAR, sodium chloride flush  Exam: Chron ill appearing, NAD, alert and follows commands today Chest occ exp wheeze, mostly clear, improved RRR no mrg Abd soft ntnd no mass No LE edema LUA AVF Neuro is sitting up in bed, responds appropriately  Dialysis: [facility]   4h   62.5kg   2/2.25 bath   Hep none  LUA AVF  - Hect 4 ug, pth 406, P 2.5, Ca 8.7  - Mirc 225 last 1/5  - Venofer 100/hd last 1/12      Assessment: 1. HCAP/ COPD - with acute COPD exac, on abx, nebs, steroids. Better.  2. AMS / agitation - resolved, he is back to his baseline mental and functional status, won't get any better 3. Anemia/ FOBT+ - on max  esa and Fe load 4. ESRD HD MWF 5. HTN stable on metop/ dilt 6. Vol stable, follow wts  7. AMS - prob d/t acute illness; head CT 1/9- no acute intracranial abnormalities  8. Blind d/t RP 9. 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 pgr [phone]   12/21/2016, 1:38 PM           Recent Labs Lab 12/17/16 0544 12/18/16 1042 12/19/16 1937  NA 139 136 135  K 4.8 5.5* 4.9  CL 101 101 97*  CO2 28 22 23   GLUCOSE 93 89 92  BUN 33* 46* 22*  CREATININE 6.10* 8.69* 4.92*  CALCIUM 8.7* 8.7* 8.9  PHOS 3.7  --   --     Recent Labs Lab 12/16/16 1057 12/17/16 0544  AST 26  --   ALT 15*  --   ALKPHOS 67  --   BILITOT 1.3*  --   PROT 6.5  --   ALBUMIN 2.9* 3.1*    Recent Labs Lab 12/15/16 1820 12/16/16 0439  12/17/16 0544 12/18/16 1042 12/19/16 1333  WBC 2.2*  2.3*  < > 2.8* 3.3* 4.2  NEUTROABS 1.5* 1.5*  --   --   --   --   HGB 6.5* 8.0*  < > 8.0* 8.1* 7.1*  HCT 21.0* 25.6*  < > 25.6* 25.7* 22.6*  MCV 97.7 93.8  < > 97.0 96.3 95.8  PLT 43* 33*  < > 55* 59* 69*  < > = values in this interval not displayed. Iron/TIBC/Ferritin/ %Sat    Component Value Date/Time   IRON 46 12/16/2016 1057   TIBC 176 (L) 12/16/2016 1057   FERRITIN 1,185 (H) 11/18/2015 0955   IRONPCTSAT 26 12/16/2016 1057   IRONPCTSAT 22 05/18/2009 1203

## 2016-12-21 NOTE — Discharge Summary (Signed)
Physician Discharge Summary  Cameron Mendez [id number] DOB: 01-30-1946 DOA: 12/15/2016  PCP: [name], MD  Admit date: 12/15/2016 Discharge date: 12/21/2016  Admitted From: SNF ([facility]) Disposition:  Return to SNF for ongoing PT and skilled nursing  Recommendations for Outpatient Follow-up:  1. Follow-up with M.D. at SNF in 1 week 2. Follow-up with scheduled dialysis 3. Monitor H&H and renal function in next 2-3 days   Equipment/Devices:As per therapy at the facility  Discharge Condition:Stable CODE STATUS:Full code Diet recommendation: Dysphagia level II with a liquid (as recommended after MBS)    Discharge Diagnoses:  Principal Problem:   Symptomatic anemia   Active Problems:   HCAP (healthcare-associated pneumonia)   Essential hypertension, benign   Thrombocytopenia (HCC)   ESRD on hemodialysis (HCC)   COPD exacerbation (HCC)   Leukopenia   Chronic diastolic CHF (congestive heart failure) (HCC)   Paroxysmal atrial fibrillation (HCC)   GI bleed   Hypokalemia   Dysphagia  Brief narrative/history of present illness Please refer to admission H&P for details, brief,71 year old male with history of incisional disease on dialysis, COPD, chronic-CHF, possible A. fib, chronic, cytopenia and anemia resident of SNF sent from dialysis with generalized weakness, confusion, cough and acute on chronic anemia. He received 1 unit PRBC and H&H has been stable. Being managed for healthcare associated pneumonia and COPD exacerbation.  Hospital course Principal Problem:   Symptomatic anemia FOBT positive. Baseline hemoglobin of 7-8 (was 6.5 on presentation). Stool for Hemoccult was positive, however  H&H stable after 1 unit PRBC. EGD in March 2016 showed chronic gastritis with DuoNeb all information and polyp in cardia. Coloscopy done at that time showed mild ascending colonic diverticulosis with moderate sigmoid diverticulosis and multiple polyps that were snared. -Added  Protonix. Since H&H is stable and no further GI bleed will resume his baby aspirin. -Also has anemia of chronic disease and iron deficiency. He receives IV iron with dialysis.  Monitor H&H closely as outpatient.  Active Problems:   Acute encephalopathy Suspected due to healthcare associated pneumonia. Patient had some confusion on presentation but was agitated on 1/8. Also contributed by acute delirium and sundowning. Required Haldol when necessary.now stable past 48 hours. Mental status significantly improving. 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   Healthcare associated pneumonia Right basilar infiltrate on chest x-ray. Antibiotic narrowed to cefepime. Has already received 7 days of antibiotic.    COPD exacerbation ([place]) Received DuoNeb's and Solu-Medrol. Symptoms better. Continue home inhalers. I will discharge him on 5 more days of oral prednisone.     Essential hypertension, benign Stable. Continue  Home medications    Thrombocytopenia (HCC) Chronic. No signs of hemolysis on peripheral smear. Appreciate hematology input. Possibly due to anemia find disease and acute illness. Transfused platelets.  Peripheral smear reviewed by hematologist. Negative for hemolysis. HIV antibody negative.    ESRD on hemodialysis [facility]) Dialysis as scheduled.    Paroxysmal atrial fibrillation (HCC) In sinus. Not on anticoagulant edition due to chronic,  Anemia and thrombocytopenia with  history of GI bleed. Continue Cardizem and beta blocker.   Chronic diastolic CHF Volume managed with dialysis  dysphagia Likely worsened by encephalopathy. MBS done today and recommend dysphagia level II diet with thin liquid.    Code Status : Full code  Family Communication  : spoke with wife on the phone   Disposition Plan  :  seen by PT who recommends skilled nursing given his encephalopathy and generalized weakness.    Consults  :   Renal  Hematology  Procedures  :  None  Discharge Instructions   Allergies as of 12/21/2016      Reactions   Ibuprofen Other (See Comments)   Kidney problems-dialysis patient   Nsaids Other (See Comments)   Kidney problems-dialysis patient      Medication List    TAKE these medications   allopurinol 100 MG tablet Commonly known as:  ZYLOPRIM Take 100 mg by mouth daily.   aspirin 81 MG tablet Take 81 mg by mouth daily.   carvedilol 12.5 MG tablet Commonly known as:  COREG Take 1 tablet (12.5 mg total) by mouth 2 (two) times daily with a meal.   diltiazem 120 MG 24 hr capsule Commonly known as:  CARDIZEM CD Take 1 capsule (120 mg total) by mouth daily.   doxercalciferol 4 MCG/2ML injection Commonly known as:  HECTOROL Inject 0.5 mLs (1 mcg total) into the vein every Monday, Wednesday, and Friday with hemodialysis.   ferric gluconate 125 mg in sodium chloride 0.9 % 100 mL Inject 125 mg into the vein every Monday, Wednesday, and Friday with hemodialysis.   pantoprazole 40 MG tablet Commonly known as:  PROTONIX Take 1 tablet (40 mg total) by mouth daily. Switch for any other PPI at similar dose and frequency  Prednisone tablet 20 mg  Take 2 tablets (40 mg ) daily for 5 days   SPIRIVA RESPIMAT 2.5 MCG/ACT Aers Generic drug:  Tiotropium Bromide Monohydrate INHALE 2 PUFFS INTO THE LUNGS EVERY MORNING   VOLTAREN 1 % Gel Generic drug:  diclofenac sodium Apply 2 g topically daily as needed (for pain).      Follow-up Information    MD at SNF Follow up.          Allergies  Allergen Reactions  . Ibuprofen Other (See Comments)    Kidney problems-dialysis patient  . Nsaids Other (See Comments)    Kidney problems-dialysis patient      Procedures/Studies: Ct Abdomen Pelvis Wo Contrast  Result Date: 12/17/2016 CLINICAL DATA:  Abdominal pain and fatigue EXAM: CT ABDOMEN AND PELVIS WITHOUT CONTRAST TECHNIQUE: Multidetector CT imaging of the abdomen and pelvis was performed following the standard  protocol without IV contrast. COMPARISON:  12/16/2016 FINDINGS: Lower chest: Small bilateral pleural effusions are noted. Bilateral lower lobe consolidation is noted right greater than left. Hepatobiliary: The liver is within normal limits. The gallbladder is well distended with dependent gallstones. No biliary obstructive changes are seen. Pancreas: Unremarkable. No pancreatic ductal dilatation or surrounding inflammatory changes. Spleen: Spleen is enlarged when compared with the prior exam and demonstrates at least 2 hypodense lesions which are poorly characterized on this noncontrast study. These were not present on the prior exam Adrenals/Urinary Tract: The adrenal glands are within normal limits bilaterally. Bilateral nonobstructing renal stones are seen. Diffuse renal vascular calcifications are noted. Scattered cystic lesions are noted in both kidneys. No obstructive changes are seen. The bladder is decompressed. Stomach/Bowel: The appendix is well visualized. No inflammatory changes are seen. Diverticular change of the colon is noted without diverticulitis. Vascular/Lymphatic: Heavy atherosclerotic calcifications of the aorta are noted without aneurysmal dilatation. No significant lymphatic abnormality is seen. Reproductive: Prostate is unremarkable. Other: 2 catheters extending into the peritoneal cavity are noted consistent with ventriculoperitoneal shunts. Some mild free fluid is noted within the abdomen likely related to these catheters. Musculoskeletal: Degenerative changes of the lumbar spine are seen. No acute bony abnormality is noted. IMPRESSION: Hypodense lesions within the spleen which are incompletely characterized on this exam. Nonemergent outpatient  MRI evaluation is recommended when the patient's condition improves to allow for optimum characterization. Bilateral pleural effusions and bibasilar consolidation. Mild free fluid within the abdomen related to ventricular shunt catheters.  Nonobstructing renal calculi bilaterally. Electronically Signed   By: Inez Catalina M.D.   On: 12/17/2016 11:25   Dg Chest 2 View  Result Date: 12/15/2016 CLINICAL DATA:  Initial evaluation for acute cough, generalized weakness. EXAM: CHEST  2 VIEW COMPARISON:  Prior radiograph from 11/24/2016. FINDINGS: Cardiomegaly, stable from prior. Mediastinal silhouette within normal limits. Aortic atherosclerosis noted. Lungs mildly hypoinflated. Shunt catheter tubing overlies the thorax. Diffuse vascular congestion without overt pulmonary edema. Small right pleural effusion. Patchy right basilar opacity, suspicious for possible infiltrate given the provided history. No pneumothorax. No acute osseous abnormality. IMPRESSION: 1. Patchy right basilar opacity, suspicious for possible infiltrate given the provided history of cough. 2. Stable cardiomegaly with diffuse pulmonary vascular congestion without overt pulmonary edema. 3. Small right pleural effusion. 4. Aortic atherosclerosis. Electronically Signed   By: Jeannine Boga M.D.   On: 12/15/2016 18:41   Dg Chest 2 View  Result Date: 11/24/2016 CLINICAL DATA:  Two weeks of cough and other rep sperm tori symptoms. No definite fever. EXAM: CHEST  2 VIEW COMPARISON:  Portable chest x-ray of November 23, 2016 FINDINGS: The lungs are borderline hypoinflated. The interstitial markings are increased. The pulmonary vascularity is engorged. The cardiac silhouette is enlarged. There is a small right pleural effusion layering posteriorly. There is calcification in the wall of the thoracic aorta. A ventriculoperitoneal shunt tube is present. The observed bony thorax exhibits no acute abnormality. IMPRESSION: CHF with mild pulmonary interstitial edema. Small right pleural effusion. No definite pneumonia. Thoracic aortic atherosclerosis. Electronically Signed   By: David  Martinique M.D.   On: 11/24/2016 13:40   Ct Head Wo Contrast  Result Date: 12/19/2016 CLINICAL DATA:   Encephalopathy, history hypertension, stage IV chronic kidney disease, COPD, CHF EXAM: CT HEAD WITHOUT CONTRAST TECHNIQUE: Contiguous axial images were obtained from the base of the skull through the vertex without intravenous contrast. Patient uncooperative with imaging. Scattered mild motion artifacts degrade exam. Sagittal and coronal MPR images reconstructed from axial data set. COMPARISON:  11/18/2015 FINDINGS: Brain: VP shunt via posterior RIGHT parietal approach with tip at RIGHT lateral ventricle. Generalized atrophy. Stable ventricular morphology. No midline shift or mass effect. Beam hardening artifacts from patient's arm/hand. Extensive small vessel chronic ischemic changes of deep cerebral white matter. Small old lacunar infarct adjacent to LEFT caudate head. No intracranial hemorrhage, mass lesion, or evidence acute infarction. No extra-axial fluid collections. Vascular: Unremarkable Skull: Grossly intact Sinuses/Orbits: Scattered mucosal thickening throughout the paranasal sinuses Other: N/A IMPRESSION: Atrophy with small vessel chronic ischemic changes of deep cerebral white matter. Stable VP shunt and ventricular morphology. 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 No acute intracranial abnormalities identified within limitations of patient motion. Electronically Signed   By: Lavonia Dana M.D.   On: 12/19/2016 09:03   Dg Chest Port 1 View  Result Date: 11/23/2016 CLINICAL DATA:  Dyspnea, CHF, hypertension, stage IV chronic kidney disease EXAM: PORTABLE CHEST 1 VIEW COMPARISON:  Portable exam 1219 hours compared to 11/23/2016 at 0604 hours FINDINGS: VP shunt tubing traverses RIGHT hemithorax. Enlargement of cardiac silhouette with pulmonary vascular congestion. Stable mediastinal contours. Atherosclerotic calcification aorta. Small RIGHT pleural effusion with questionable tiny LEFT pleural effusion. Mild RIGHT basilar opacity, improved from earlier study, question atelectasis or slightly  improved edema. Remaining lungs clear. No pneumothorax. Bones demineralized.  IMPRESSION: Enlargement of cardiac silhouette with pulmonary vascular congestion. Small RIGHT pleural effusion with slightly improved aeration since the earlier study question improved atelectasis versus edema. No Electronically Signed   By: Lavonia Dana M.D.   On: 11/23/2016 12:27   Dg Chest Portable 1 View  Result Date: 11/23/2016 CLINICAL DATA:  71 year old male with shortness of breath and cough. EXAM: PORTABLE CHEST 1 VIEW COMPARISON:  Chest radiograph dated 11/21/2015 FINDINGS: There is stable cardiomegaly. Diffuse interstitial prominence and nodularity may represent mild edema. Right lung base hazy density may represent atelectasis versus infiltrate. A small right pleural effusion noted. There is no pneumothorax. A shunt catheter is noted along the right hemithorax. No acute osseous pathology identified. IMPRESSION: Small right pleural effusion with right lung base atelectasis versus infiltrate. Stable cardiomegaly with possible mild interstitial edema. Electronically Signed   By: Anner Crete M.D.   On: 11/23/2016 06:18   Dg Abd Acute W/chest  Result Date: 12/17/2016 CLINICAL DATA:  Abdominal pain today EXAM: DG ABDOMEN ACUTE W/ 1V CHEST COMPARISON:  12/15/2016 FINDINGS: The abdominal gas pattern is normal. Ventriculoperitoneal shunt tubing projects over the chest and abdomen. There are calcifications along the expected location of the pancreas which likely relate to chronic pancreatitis. The upright view of the chest demonstrates a small right pleural effusion and mild linear basilar lung opacities, as well as moderate vascular and interstitial congestive changes. Unchanged cardiomegaly. IMPRESSION: 1. No evidence of bowel obstruction or perforation. 2. Question pancreatic calcifications, suggesting chronic pancreatitis. 3. Vascular and interstitial prominence, cardiomegaly, and small right pleural effusion. This may  represent mild congestive heart failure. Electronically Signed   By: Andreas Newport M.D.   On: 12/17/2016 00:15    (Echo, Carotid, EGD, Colonoscopy, ERCP)    Subjective:   Discharge Exam: Vitals:   12/21/16 1138 12/21/16 1140  BP: (!) 169/54 (!) 142/60  Pulse: 80   Resp:    Temp:     Vitals:   12/21/16 0554 12/21/16 0840 12/21/16 1138 12/21/16 1140  BP: (!) 162/52  (!) 169/54 (!) 142/60  Pulse: 77  80   Resp: 18     Temp: 97.9 F (36.6 C)     TempSrc: Oral     SpO2: 95% 94% 100%   Weight:      Height:       Gen: not in distress HEENT: Pallor present, moist mucosa, supple neck Chest: clear b/l, no added sounds CVS: N S1&S2, no murmurs, rubs or gallop GI: soft, NT, ND, BS+ Musculoskeletal: warm, no edema CNS: Alert and oriented 2. Nonfocal     The results of significant diagnostics from this hospitalization (including imaging, microbiology, ancillary and laboratory) are listed below for reference.     Microbiology: Recent Results (from the past 240 hour(s))  Respiratory Panel by PCR     Status: None   Collection Time: 12/15/16  1:30 AM  Result Value Ref Range Status   Adenovirus NOT DETECTED NOT DETECTED Final   Coronavirus 229E NOT DETECTED NOT DETECTED Final   Coronavirus HKU1 NOT DETECTED NOT DETECTED Final   Coronavirus NL63 NOT DETECTED NOT DETECTED Final   Coronavirus OC43 NOT DETECTED NOT DETECTED Final   Metapneumovirus NOT DETECTED NOT DETECTED Final   Rhinovirus / Enterovirus NOT DETECTED NOT DETECTED Final   Influenza A NOT DETECTED NOT DETECTED Final   Influenza B NOT DETECTED NOT DETECTED Final   Parainfluenza Virus 1 NOT DETECTED NOT DETECTED Final   Parainfluenza Virus 2 NOT DETECTED NOT DETECTED  Final   Parainfluenza Virus 3 NOT DETECTED NOT DETECTED Final   Parainfluenza Virus 4 NOT DETECTED NOT DETECTED Final   Respiratory Syncytial Virus NOT DETECTED NOT DETECTED Final   Bordetella pertussis NOT DETECTED NOT DETECTED Final    Chlamydophila pneumoniae NOT DETECTED NOT DETECTED Final   Mycoplasma pneumoniae NOT DETECTED NOT DETECTED Final  Culture, blood (routine x 2) Call MD if unable to obtain prior to antibiotics being given     Status: None   Collection Time: 12/15/16  8:19 PM  Result Value Ref Range Status   Specimen Description BLOOD RIGHT ARM  Final   Special Requests BOTTLES DRAWN AEROBIC AND ANAEROBIC 5ML  Final   Culture NO GROWTH 5 DAYS  Final   Report Status 12/20/2016 FINAL  Final  Culture, blood (routine x 2) Call MD if unable to obtain prior to antibiotics being given     Status: None   Collection Time: 12/15/16  8:24 PM  Result Value Ref Range Status   Specimen Description BLOOD RIGHT HAND  Final   Special Requests IN PEDIATRIC BOTTLE 1ML  Final   Culture NO GROWTH 5 DAYS  Final   Report Status 12/20/2016 FINAL  Final  MRSA PCR Screening     Status: None   Collection Time: 12/16/16 12:33 AM  Result Value Ref Range Status   MRSA by PCR NEGATIVE NEGATIVE Final    Comment:        The GeneXpert MRSA Assay (FDA approved for NASAL specimens only), is one component of a comprehensive MRSA colonization surveillance program. It is not intended to diagnose MRSA infection nor to guide or monitor treatment for MRSA infections.      Labs: BNP (last 3 results) No results for input(s): BNP in the last 8760 hours. Basic Metabolic Panel:  Recent Labs Lab 12/15/16 1820 12/16/16 0834 12/17/16 0544 12/18/16 1042 12/19/16 1937  NA 141 139 139 136 135  K 3.2* 4.6 4.8 5.5* 4.9  CL 100* 102 101 101 97*  CO2 31 27 28 22 23   GLUCOSE 113* 81 93 89 92  BUN 13 22* 33* 46* 22*  CREATININE 3.02* 4.26* 6.10* 8.69* 4.92*  CALCIUM 8.3* 8.2* 8.7* 8.7* 8.9  MG  --  1.8  --   --   --   PHOS  --   --  3.7  --   --    Liver Function Tests:  Recent Labs Lab 12/16/16 1057 12/17/16 0544  AST 26  --   ALT 15*  --   ALKPHOS 67  --   BILITOT 1.3*  --   PROT 6.5  --   ALBUMIN 2.9* 3.1*    Recent  Labs Lab 12/17/16 0800  LIPASE 26   No results for input(s): AMMONIA in the last 168 hours. CBC:  Recent Labs Lab 12/15/16 1820 12/16/16 0439 12/16/16 1819 12/17/16 0544 12/18/16 1042 12/19/16 1333  WBC 2.2* 2.3* 2.9* 2.8* 3.3* 4.2  NEUTROABS 1.5* 1.5*  --   --   --   --   HGB 6.5* 8.0* 7.9* 8.0* 8.1* 7.1*  HCT 21.0* 25.6* 25.5* 25.6* 25.7* 22.6*  MCV 97.7 93.8 95.9 97.0 96.3 95.8  PLT 43* 33* 64* 55* 59* 69*   Cardiac Enzymes: No results for input(s): CKTOTAL, CKMB, CKMBINDEX, TROPONINI in the last 168 hours. BNP: Invalid input(s): POCBNP CBG: No results for input(s): GLUCAP in the last 168 hours. D-Dimer No results for input(s): DDIMER in the last 72 hours. Hgb A1c No results  for input(s): HGBA1C in the last 72 hours. Lipid Profile No results for input(s): CHOL, HDL, LDLCALC, TRIG, CHOLHDL, LDLDIRECT in the last 72 hours. Thyroid function studies No results for input(s): TSH, T4TOTAL, T3FREE, THYROIDAB in the last 72 hours.  Invalid input(s): FREET3 Anemia work up No results for input(s): VITAMINB12, FOLATE, FERRITIN, TIBC, IRON, RETICCTPCT in the last 72 hours. Urinalysis    Component Value Date/Time   COLORURINE YELLOW 11/18/2015 0902   APPEARANCEUR CLOUDY (A) 11/18/2015 0902   LABSPEC 1.011 11/18/2015 0902   LABSPEC 1.015 03/09/2009 1116   PHURINE 8.5 (H) 11/18/2015 0902   GLUCOSEU NEGATIVE 11/18/2015 0902   HGBUR SMALL (A) 11/18/2015 0902   BILIRUBINUR NEGATIVE 11/18/2015 0902   BILIRUBINUR Negative 03/09/2009 1116   KETONESUR NEGATIVE 11/18/2015 0902   PROTEINUR 100 (A) 11/18/2015 0902   UROBILINOGEN 0.2 03/03/2015 1950   NITRITE NEGATIVE 11/18/2015 0902   LEUKOCYTESUR SMALL (A) 11/18/2015 0902   LEUKOCYTESUR Trace 03/09/2009 1116   Sepsis Labs Invalid input(s): PROCALCITONIN,  WBC,  LACTICIDVEN Microbiology Recent Results (from the past 240 hour(s))  Respiratory Panel by PCR     Status: None   Collection Time: 12/15/16  1:30 AM  Result Value  Ref Range Status   Adenovirus NOT DETECTED NOT DETECTED Final   Coronavirus 229E NOT DETECTED NOT DETECTED Final   Coronavirus HKU1 NOT DETECTED NOT DETECTED Final   Coronavirus NL63 NOT DETECTED NOT DETECTED Final   Coronavirus OC43 NOT DETECTED NOT DETECTED Final   Metapneumovirus NOT DETECTED NOT DETECTED Final   Rhinovirus / Enterovirus NOT DETECTED NOT DETECTED Final   Influenza A NOT DETECTED NOT DETECTED Final   Influenza B NOT DETECTED NOT DETECTED Final   Parainfluenza Virus 1 NOT DETECTED NOT DETECTED Final   Parainfluenza Virus 2 NOT DETECTED NOT DETECTED Final   Parainfluenza Virus 3 NOT DETECTED NOT DETECTED Final   Parainfluenza Virus 4 NOT DETECTED NOT DETECTED Final   Respiratory Syncytial Virus NOT DETECTED NOT DETECTED Final   Bordetella pertussis NOT DETECTED NOT DETECTED Final   Chlamydophila pneumoniae NOT DETECTED NOT DETECTED Final   Mycoplasma pneumoniae NOT DETECTED NOT DETECTED Final  Culture, blood (routine x 2) Call MD if unable to obtain prior to antibiotics being given     Status: None   Collection Time: 12/15/16  8:19 PM  Result Value Ref Range Status   Specimen Description BLOOD RIGHT ARM  Final   Special Requests BOTTLES DRAWN AEROBIC AND ANAEROBIC 5ML  Final   Culture NO GROWTH 5 DAYS  Final   Report Status 12/20/2016 FINAL  Final  Culture, blood (routine x 2) Call MD if unable to obtain prior to antibiotics being given     Status: None   Collection Time: 12/15/16  8:24 PM  Result Value Ref Range Status   Specimen Description BLOOD RIGHT HAND  Final   Special Requests IN PEDIATRIC BOTTLE 1ML  Final   Culture NO GROWTH 5 DAYS  Final   Report Status 12/20/2016 FINAL  Final  MRSA PCR Screening     Status: None   Collection Time: 12/16/16 12:33 AM  Result Value Ref Range Status   MRSA by PCR NEGATIVE NEGATIVE Final    Comment:        The GeneXpert MRSA Assay (FDA approved for NASAL specimens only), is one component of a comprehensive MRSA  colonization surveillance program. It is not intended to diagnose MRSA infection nor to guide or monitor treatment for MRSA infections.  Time coordinating discharge: Over 30 minutes  SIGNED:   Louellen Molder, MD  [facility] 12/21/2016, 1:34 PM Pager   If 7PM-7AM, please contact night-coverage [email or web address] Password [email or web address]

## 2016-12-22 ENCOUNTER — Encounter: Payer: Self-pay | Admitting: Nurse Practitioner

## 2016-12-22 NOTE — Progress Notes (Deleted)
Nursing Home Location: [facility] Room: 312  Place of Service: SNF (31)  PCP: [name], MD  Allergies  Allergen Reactions  . Ibuprofen Other (See Comments)    Kidney problems-dialysis patient  . Nsaids Other (See Comments)    Kidney problems-dialysis patient    Chief Complaint  Patient presents with  . Hospitalization Follow-up    [facility] stay 12/15/16 unitl 12/21/16    HPI:  Patient is a 71 y.o. male seen today at [place] of Systems:  Review of Systems  Past Medical History:  Diagnosis Date  . Anemia   . Arthritis    Gout  . Blind   . BPH (benign prostatic hyperplasia)   . Cardiac arrest ([place])   . CHF (congestive heart failure) ([place])   . CKD (chronic kidney disease), stage IV ([place])    a. L upper extremity AV fistula created 07/2012.  . Colon polyps   . COPD (chronic obstructive pulmonary disease) ([place])   . Diastolic dysfunction   . Gangrene of finger ([place])    a. 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  . GI (gastrointestinal bleed) feb 2016  . Hypertension    dx--"long time"  . Irregular heart rate 03/03/2015  . Pericardial effusion    a. Noted on echo 10/2009. b. Again seen on echo 09/2013.  [name] Retinitis pigmentosa    Blindness--has shunt --placed yrs ago in [place]..   Past Surgical History:  Procedure Laterality Date  . AMPUTATION  12/30/2012   Procedure: AMPUTATION DIGIT;  Surgeon: Tennis Must, MD;  Location: [place];  Service: Orthopedics;  Laterality: Left;  LEFT SMALL FINGER AMPUTATION  . AV FISTULA PLACEMENT  07/15/2012   Procedure: ARTERIOVENOUS (AV) FISTULA CREATION;  Surgeon: Rosetta Posner, MD;  Location: [place];  Service: Vascular;  Laterality: Left;  . CATARACT EXTRACTION W/ INTRAOCULAR LENS  IMPLANT, BILATERAL Bilateral   . KNEE LIGAMENT RECONSTRUCTION Left   . PERICARDIAL TAP N/A 09/19/2013   Procedure: PERICARDIAL TAP;  Surgeon: Blane Ohara, MD;  [facility] C[facility];   Service: Cardiovascular;  Laterality: N/A;  . PERICARDIOCENTESIS  09/2013   Social History:   reports that he has been smoking Cigarettes.  He has a 25.00 pack-year smoking history. He has never used smokeless tobacco. He reports that he does not drink alcohol or use drugs.  Family History  Problem Relation Age of Onset  . Ovarian cancer Mother   . Colon polyps Brother   . Colon cancer Brother   . Kidney disease Neg Hx   . Gallbladder disease Neg Hx   . Esophageal cancer Neg Hx   . Heart disease Neg Hx   . Stomach cancer Neg Hx     Medications: Patient's Medications  New Prescriptions   No medications on file  Previous Medications   ALLOPURINOL (ZYLOPRIM) 100 MG TABLET    Take 100 mg by mouth daily.    ASPIRIN 81 MG TABLET    Take 81 mg by mouth daily.   CARVEDILOL (COREG) 12.5 MG TABLET    Take 1 tablet (12.5 mg total) by mouth 2 (two) times daily with a meal.   DILTIAZEM (CARDIZEM CD) 120 MG 24 HR CAPSULE    Take 1 capsule (120 mg total) by mouth daily.   DOXERCALCIFEROL (HECTOROL) 4 MCG/2ML INJECTION    Inject 0.5 mLs (1 mcg total) into the vein every Monday, Wednesday, and Friday with hemodialysis.   FERRIC  GLUCONATE 125 MG IN SODIUM CHLORIDE 0.9 % 100 ML    Inject 125 mg into the vein every Monday, Wednesday, and Friday with hemodialysis.   PANTOPRAZOLE (PROTONIX) 40 MG TABLET    Take 1 tablet (40 mg total) by mouth daily. Switch for any other PPI at similar dose and frequency   PREDNISONE (DELTASONE) 20 MG TABLET    Take 40 mg by mouth daily with breakfast. Stop date: 12/26/16   SPIRIVA RESPIMAT 2.5 MCG/ACT AERS    INHALE 2 PUFFS INTO THE LUNGS EVERY MORNING  Modified Medications   No medications on file  Discontinued Medications   DICLOFENAC SODIUM (VOLTAREN) 1 % GEL    Apply 2 g topically daily as needed (for pain).   PREDNISONE (DELTASONE) 20 MG TABLET    40 mg ( 2 tablet ) daily for 5 days     Physical Exam: Vitals:   12/22/16 0950  BP: 132/74  Pulse: 82  Resp:  16  Temp: 97 F (36.1 C)  SpO2: 95%    Physical Exam  Labs reviewed: Basic Metabolic Panel:  Recent Labs  11/24/16 0541  11/27/16 0700  12/16/16 0834 12/17/16 0544 12/18/16 1042 12/19/16 1937  NA 135  < > 131*  < > 139 139 136 135  K 3.8  < > 4.9  < > 4.6 4.8 5.5* 4.9  CL 96*  < > 93*  < > 102 101 101 97*  CO2 24  < > 22  < > 27 28 22 23   GLUCOSE 110*  < > 95  < > 81 93 89 92  BUN 35*  < > 113*  < > 22* 33* 46* 22*  CREATININE 5.27*  < > 8.72*  < > 4.26* 6.10* 8.69* 4.92*  CALCIUM 8.7*  < > 8.0*  < > 8.2* 8.7* 8.7* 8.9  MG  --   --   --   --  1.8  --   --   --   PHOS 7.3*  --  7.5*  --   --  3.7  --   --   < > = values in this interval not displayed. Liver Function Tests:  Recent Labs  11/23/16 0610  11/27/16 0700 12/16/16 1057 12/17/16 0544  AST 24  --   --  26  --   ALT 18  --   --  15*  --   ALKPHOS 51  --   --  67  --   BILITOT 0.7  --   --  1.3*  --   PROT 7.3  --   --  6.5  --   ALBUMIN 3.3*  < > 3.2* 2.9* 3.1*  < > = values in this interval not displayed.  Recent Labs  12/17/16 0800  LIPASE 26   No results for input(s): AMMONIA in the last 8760 hours. CBC:  Recent Labs  11/23/16 0610  12/15/16 1820 12/16/16 0439  12/17/16 0544 12/18/16 1042 12/19/16 1333  WBC 7.7  < > 2.2* 2.3*  < > 2.8* 3.3* 4.2  NEUTROABS 6.4  --  1.5* 1.5*  --   --   --   --   HGB 7.9*  < > 6.5* 8.0*  < > 8.0* 8.1* 7.1*  HCT 25.5*  < > 21.0* 25.6*  < > 25.6* 25.7* 22.6*  MCV 95.1  < > 97.7 93.8  < > 97.0 96.3 95.8  PLT 113*  < > 43* 33*  < > 55* 59*  69*  < > = values in this interval not displayed. TSH: No results for input(s): TSH in the last 8760 hours. A1C: No results found for: HGBA1C Lipid Panel: No results for input(s): CHOL, HDL, LDLCALC, TRIG, CHOLHDL, LDLDIRECT in the last 8760 hours.  Radiological Exams:  Assessment/Plan There are no diagnoses linked to this encounter.    Carlos American. Harle Battiest  [facility] [phone] 8 am - 5 pm) [phone] (after hours)

## 2016-12-22 NOTE — Progress Notes (Signed)
This encounter was created in error - please disregard.

## 2016-12-26 ENCOUNTER — Non-Acute Institutional Stay (SKILLED_NURSING_FACILITY): Payer: Medicare Other | Admitting: Internal Medicine

## 2016-12-26 ENCOUNTER — Encounter: Payer: Self-pay | Admitting: Internal Medicine

## 2016-12-26 DIAGNOSIS — N186 End stage renal disease: Secondary | ICD-10-CM

## 2016-12-26 DIAGNOSIS — G9340 Encephalopathy, unspecified: Secondary | ICD-10-CM | POA: Diagnosis not present

## 2016-12-26 DIAGNOSIS — J189 Pneumonia, unspecified organism: Secondary | ICD-10-CM | POA: Diagnosis not present

## 2016-12-26 DIAGNOSIS — Z992 Dependence on renal dialysis: Secondary | ICD-10-CM

## 2016-12-26 DIAGNOSIS — D649 Anemia, unspecified: Secondary | ICD-10-CM | POA: Diagnosis not present

## 2016-12-26 NOTE — Assessment & Plan Note (Signed)
Hold dialysis 1/17 if status fails to improve

## 2016-12-26 NOTE — Assessment & Plan Note (Signed)
Haldol as needed for agitation

## 2016-12-26 NOTE — Progress Notes (Signed)
Facility Location: [facility]  Room Number: [id number]  Code Status:   PCP: [name], MD [address] [place] [address]  This is a nursing facility follow up for [place] readmission within 30 days  Interim medical record and care since last [place] visit was updated with review of diagnostic studies and change in clinical status since last visit were documented.  HPI: The patient was hospitalized 1/5-1/11/18, sent from dialysis with generalized weakness, confusion, cough, and acute on chronic anemia. Chest x-ray revealed right basilar infiltrate him a broad-spectrum antibiotics were prescribed. Aggressive pulmonary toilet issues along with parenteral and then oral steroids. Was significant improvement in his pulmonary status. He was discharged to complete 2 additional days of oral prednisone. Hemoglobin 6.5 on presentation. He received 1 unit of red cells. Fecal occult blood testing was positive. EGD in March 2016 and revealed chronic gastritis and a polyp in the cardia. Colonoscopy had revealed moderate sigmoid diverticulosis and multiple polyps which were snared. He was placed on Protonix. Additional GI bleeding, baby aspirin was reinitiated. He was to continue to receive IV iron with dialysis. Course was Dictated by encephalopathy attributed to the pneumonia. Dish and confusion he was agitated 1/8 also exhibit sundowning. He required Haldol as needed for the mental status changes. Underlying dementia was questioned. On 1/9 Louie Casa was 4.92 GFR 13. Potassium was 4.9. Hemoglobin 7.1 and hematocrit 22.6. Platelet count 69,000. He was transferred back to SNF because of his encephalopathy and generalized weakness.  Review of systems: Could not be completed due to marked lethargy and minimal verbal interaction. Speech was somewhat slurred. He stated that he felt "rough" but then stated he felt "pretty good". There were no localizing  signs or symptoms described despite repeated coaxing   Physical exam:  Pertinent or positive findings:He appears somewhat chronically ill. As noted patient was lethargic.  Pattern alopecia is present. The right earlobe is bilobed. There is intermittent esotropia of the right eye. Complete dentures are noted.  He jumped as if in pain when I attempted to palpate pulses of the feet. There is decreased range of motion of the left upper extremity but he appeared generally symmetrically weak otherwise. He did recognize his wife when she entered the room.   General appearance: no acute distress , increased work of breathing is present.   Lymphatic: No lymphadenopathy about the head, neck, axilla . Eyes: No conjunctival inflammation or lid edema is present. There is no scleral icterus. Nose:  External nasal examination shows no deformity or inflammation. Nasal mucosa are pink and moist without lesions ,exudates Oral exam: lips and gums are healthy appearing.There is no oropharyngeal erythema or exudate . Neck:  No thyromegaly, masses, tenderness noted.    Heart:  Normal rate and regular rhythm. S1 and S2 normal without gallop, murmur, click, rub .  Lungs:Chest clear to auscultation without wheezes, rhonchi,rales , rubs. Abdomen:Bowel sounds are normal. Abdomen is soft and nontender with no organomegaly, hernias,masses. GU: deferred  Extremities:  No cyanosis, clubbing,edema  Neurologic exam : Balance,Rhomberg,finger to nose testing could not be completed due to clinical state Deep tendon reflexes are equal Skin: Warm & dry w/o tenting. No significant lesions or rash.  #1 altered mental status without definite localizing signs. Sundowning while hospitalized for healthcare associated pneumonia #2 symptomatic anemia #3 dialysis for end-stage renal disease   Plan:Dialysis will need to be deferred if he is not mentally alert in the morning.

## 2016-12-26 NOTE — Assessment & Plan Note (Signed)
Monitor CBC 

## 2016-12-26 NOTE — Assessment & Plan Note (Signed)
Afebrile with excellent O2 sats status post full course of antibiotics

## 2016-12-26 NOTE — Patient Instructions (Signed)
See Current Assessment & Plan in Problem List under specific Diagnosis 

## 2016-12-27 LAB — CBC AND DIFFERENTIAL
HEMATOCRIT: 41 % (ref 41–53)
Hemoglobin: 12.3 g/dL — AB (ref 13.5–17.5)
PLATELETS: 54 10*3/uL — AB (ref 150–399)
WBC: 4.3 10*3/mL

## 2016-12-28 ENCOUNTER — Encounter: Payer: Self-pay | Admitting: Internal Medicine

## 2016-12-28 ENCOUNTER — Non-Acute Institutional Stay (SKILLED_NURSING_FACILITY): Payer: Medicare Other | Admitting: Internal Medicine

## 2016-12-28 DIAGNOSIS — G9340 Encephalopathy, unspecified: Secondary | ICD-10-CM

## 2016-12-28 NOTE — Patient Instructions (Signed)
See Current Assessment & Plan in Problem List under specific Diagnosis 

## 2016-12-28 NOTE — Assessment & Plan Note (Signed)
Haldol will be continued for sundowning with agitation on an as-needed basis Low-dose aspirin will be continued Consideration for Aricept or Namenda will be deferred to his primary care physician, Dr. Berdine Addison, who undoubtedly has more information about his baseline mental status Labs ordered 1/16 are still pending, but they're not expected to explain the mental status changes which have improved clinically

## 2016-12-28 NOTE — Progress Notes (Signed)
    This is a nursing facility follow up for specific acute issue of altered mental status as noted 12/26/16.  Interim medical record and care since last [place] visit was updated with review of diagnostic studies and change in clinical status since last visit were documented.  HPI: He voices no specific complaints today. He does declare that he is legally blind with essentially no vision even for light. The chart was reviewed. 12/19/16 CT scan revealed cerebral atrophy with small vessel disease. 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  Past history includes blindness related to retinitis pigmentosa. Past history also includes cardiac arrest but no prior stroke.  Review of systems: He specifically denies headaches or any new neurologic signs. onstitutional: No fever,significant weight change, fatigue  Eyes: No redness, discharge, pain, vision change ENT/mouth: No nasal congestion,  purulent discharge, earache,change in hearing ,sore throat  Cardiovascular: No chest pain, palpitations,paroxysmal nocturnal dyspnea, claudication, edema  Respiratory: No cough, sputum production,hemoptysis, DOE , significant snoring,apnea  Gastrointestinal: No heartburn,dysphagia,abdominal pain, nausea / vomiting,rectal bleeding, melena,change in bowels Genitourinary: No dysuria,hematuria, pyuria,  incontinence, nocturia Musculoskeletal: No joint stiffness, joint swelling, weakness,pain Dermatologic: No rash, pruritus, change in appearance of skin Neurologic: No dizziness,headache,syncope, seizures, numbness , tingling Psychiatric: No significant anxiety , depression, insomnia, anorexia Endocrine: No change in hair/skin/ nails, excessive thirst, excessive hunger, excessive urination  Hematologic/lymphatic: No significant bruising, lymphadenopathy,abnormal bleeding Allergy/immunology: No itchy/ watery eyes, significant sneezing, urticaria, angioedema  Physical exam:  Pertinent or  positive findings: He is sitting in the wheelchair. He is much more alert than he was on 12/26/16. He does have alternating exotropia of eyes. Complete dentures are present. Breath sounds are somewhat decreased. First heart sound is slightly accentuated. I can appreciate no carotid bruits. Clubbing of the nailbeds is suggested. Pedal pulses are decreased. He has good range of motion and strength in the extremities. He gave the date as Feb 2018. The president was named "Hassell Done". There is a 3 x 2" subcutaneous mass over the left posterior neck. It is not attached to the subcutaneous musculature. It does transilluminate.  General appearance:Adequately nourished; no acute distress , increased work of breathing is present.   Lymphatic: No lymphadenopathy about the head, neck, axilla . Eyes: No conjunctival inflammation or lid edema is present. There is no scleral icterus. Ears:  External ear exam shows no significant lesions or deformities.   Nose:  External nasal examination shows no deformity or inflammation. Nasal mucosa are pink and moist without lesions ,exudates Oral exam: lips and gums are healthy appearing.There is no oropharyngeal erythema or exudate . Neck:  No thyromegaly, masses, tenderness noted.    Heart:  Normal rate and regular rhythm. S2 normal without gallop, murmur, click, rub .  Lungs:Chest clear to auscultation without wheezes, rhonchi,rales , rubs. Abdomen:Bowel sounds are normal. Abdomen is soft and nontender with no organomegaly, hernias,masses. GU: deferred  Extremities:  No cyanosis, edema  Skin: Warm & dry w/o tenting. No significant lesions or rash.  See summary under each active problem in the Problem List with associated updated therapeutic plan. It appears the changes noted 12/26/16 was sundowning as he is improved. 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

## 2017-01-04 ENCOUNTER — Encounter: Payer: Self-pay | Admitting: Internal Medicine

## 2017-01-04 ENCOUNTER — Non-Acute Institutional Stay (SKILLED_NURSING_FACILITY): Payer: Medicare Other | Admitting: Internal Medicine

## 2017-01-04 DIAGNOSIS — N186 End stage renal disease: Secondary | ICD-10-CM | POA: Diagnosis not present

## 2017-01-04 DIAGNOSIS — G9340 Encephalopathy, unspecified: Secondary | ICD-10-CM | POA: Diagnosis not present

## 2017-01-04 DIAGNOSIS — Z992 Dependence on renal dialysis: Secondary | ICD-10-CM | POA: Diagnosis not present

## 2017-01-04 DIAGNOSIS — D649 Anemia, unspecified: Secondary | ICD-10-CM | POA: Diagnosis not present

## 2017-01-04 NOTE — Assessment & Plan Note (Signed)
01/04/17 patient unable to give the date or the name of the president. Baseline dementia present, consideration of Aricept or Namenda deferred to primary care physician Sundowning behavior improved, he was not discharged on Haldol

## 2017-01-04 NOTE — Assessment & Plan Note (Signed)
IV iron infusions will continue 3 times weekly

## 2017-01-04 NOTE — Patient Instructions (Addendum)
See Current Assessment & Plan in Problem List under specific Diagnosis Note: Haldol was not prescribed at discharge. Medications will be verified with the wife as he has dementia and is blind.

## 2017-01-04 NOTE — Progress Notes (Signed)
The patient is being discharged from [facility] on this date by Unice Cobble MD.  PCP: [name] MD  The medical history in this facility was reviewed and summarized and medical problem list was updated. Time spent and note content is documented as follows.  Summary of [place] [facility] medical records: The patient was admitted from dialysis 1/5 with generalized weakness, confusion, cough, and anemia. X-ray revealed right basilar infiltrate; he received a broad-spectrum antibiotic, aggressive pulmonary toilet and IV and then oral steroids. He received 1 unit of packed cells for hemoglobin 6.5. FOB was positive. He had had an EGD in 3/16 revealing chronic gastritis and a cardiac polyp. Colonoscopy had revealed moderate sigmoid diverticulosis and multiple polyps which were snared. He was placed on a PPI . At dialysis he was to continue IV iron supplements. Admission was complicated by encephalopathy attributed to pneumonia. He also exhibited sundowning. Haldol as needed was prescribed for the mental status changes. Underlying dementia was questioned. CT scan 12/29/16 had revealed cerebral atrophy with small vessel disease. 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 Here at the SNF he exhibited varying changes in mental status with suggestion of sundowning. This did improve but he continued to have some dementia. He is unable to name the year correctly or the president. Last labs on record creatinine 4.92, BUN 22 and GFR of 13. Chronic normocytic anemia was present with a hemoglobin of 7.1 and hematocrit 22.6  Review of systems: The patient is asymptomatic despite history of pneumonia and GI bleeding, he has no cardio pulmonary or GI symptoms. Constitutional: No fever,significant weight change, fatigue  Eyes: No redness, discharge, pain, vision change ENT/mouth: No nasal congestion,  purulent discharge, earache,change in hearing ,sore throat    Cardiovascular: No chest pain, palpitations,paroxysmal nocturnal dyspnea, claudication, edema  Respiratory: No cough, sputum production,hemoptysis, DOE , significant snoring,apnea  Gastrointestinal: No heartburn,dysphagia,abdominal pain, nausea / vomiting,rectal bleeding, melena,change in bowels Genitourinary: No dysuria,hematuria, pyuria,  incontinence, nocturia Musculoskeletal: No joint stiffness, joint swelling, weakness,pain Dermatologic: No rash, pruritus, change in appearance of skin Neurologic: No dizziness,headache,syncope, seizures, numbness , tingling Psychiatric: No significant anxiety , depression, insomnia, anorexia Endocrine: No change in hair/skin/ nails, excessive thirst, excessive hunger, excessive urination  Hematologic/lymphatic: No significant bruising, lymphadenopathy,abnormal bleeding Allergy/immunology: No itchy/ watery eyes, significant sneezing, urticaria, angioedema  Physical exam:  Pertinent or positive findings: Alopecia is present. He has complete dentures. He has intermittent alternating exotropia. Pupils are small. He has a gray/white hypopigmentation of the left iris inferiorly. Breath sounds are asymmetric, decreased on the right posteriorly. He has trace edema at the sock line. Pedal pulses are decreased. There is amputation of the fifth left finger. Slight clubbing is suggested. The patient gives the year as 94. He is unable to name the president. He was not able to define his job description was in [place] nor the president when he went there. He was elated when he found he was going home.  General appearance:Adequately nourished; no acute distress , increased work of breathing is present.   Lymphatic: No lymphadenopathy about the head, neck, axilla . Eyes: No conjunctival inflammation or lid edema is present. There is no scleral icterus. Ears:  External ear exam shows no significant lesions or deformities.   Nose:  External nasal examination shows no  deformity or inflammation. Nasal mucosa are pink and moist without lesions ,exudates Oral exam: lips and gums are healthy appearing.There is no oropharyngeal erythema or exudate . Neck:  No thyromegaly, masses, tenderness noted.    Heart:  Normal rate and regular rhythm. S1 and S2 normal without gallop, murmur, click, rub .  Lungs: without wheezes, rhonchi,rales , rubs. Abdomen:Bowel sounds are normal. Abdomen is soft and nontender with no organomegaly, hernias,masses. GU: deferred. Extremities:  No cyanosis Deep tendon reflexes are equal Skin: Warm & dry w/o tenting. No significant lesions or rash.  See clinical summary of Discharge Diagnoses in the Problem List with associated updated therapeutic plan  Discharge instructions were written and discharge instructions provided. Follow-up will be by the primary care physician in seven - 14 days.

## 2017-01-04 NOTE — Assessment & Plan Note (Signed)
Anemia is normochromic, normocytic and multifactorial. Iron infusions with dialysis will continue 3 times per week. PPI continued at discharge from SNF

## 2017-10-15 ENCOUNTER — Emergency Department (HOSPITAL_COMMUNITY): Payer: Medicare Other

## 2017-10-15 ENCOUNTER — Encounter (HOSPITAL_COMMUNITY): Payer: Self-pay | Admitting: Pharmacy Technician

## 2017-10-15 ENCOUNTER — Other Ambulatory Visit: Payer: Self-pay

## 2017-10-15 ENCOUNTER — Observation Stay (HOSPITAL_COMMUNITY)
Admission: EM | Admit: 2017-10-15 | Discharge: 2017-10-16 | Disposition: A | Discharge status: Home / Self Care | Payer: Medicare Other | Attending: Internal Medicine | Admitting: Internal Medicine

## 2017-10-15 DIAGNOSIS — J449 Chronic obstructive pulmonary disease, unspecified: Secondary | ICD-10-CM | POA: Diagnosis not present

## 2017-10-15 DIAGNOSIS — N400 Enlarged prostate without lower urinary tract symptoms: Secondary | ICD-10-CM | POA: Insufficient documentation

## 2017-10-15 DIAGNOSIS — D61818 Other pancytopenia: Secondary | ICD-10-CM | POA: Diagnosis not present

## 2017-10-15 DIAGNOSIS — Z992 Dependence on renal dialysis: Secondary | ICD-10-CM | POA: Insufficient documentation

## 2017-10-15 DIAGNOSIS — H548 Legal blindness, as defined in USA: Secondary | ICD-10-CM | POA: Diagnosis not present

## 2017-10-15 DIAGNOSIS — M109 Gout, unspecified: Secondary | ICD-10-CM | POA: Diagnosis not present

## 2017-10-15 DIAGNOSIS — D696 Thrombocytopenia, unspecified: Secondary | ICD-10-CM | POA: Diagnosis not present

## 2017-10-15 DIAGNOSIS — G9340 Encephalopathy, unspecified: Principal | ICD-10-CM | POA: Diagnosis present

## 2017-10-15 DIAGNOSIS — E8889 Other specified metabolic disorders: Secondary | ICD-10-CM | POA: Diagnosis not present

## 2017-10-15 DIAGNOSIS — N186 End stage renal disease: Secondary | ICD-10-CM | POA: Diagnosis not present

## 2017-10-15 DIAGNOSIS — Z79899 Other long term (current) drug therapy: Secondary | ICD-10-CM | POA: Insufficient documentation

## 2017-10-15 DIAGNOSIS — Z8673 Personal history of transient ischemic attack (TIA), and cerebral infarction without residual deficits: Secondary | ICD-10-CM | POA: Insufficient documentation

## 2017-10-15 DIAGNOSIS — F17210 Nicotine dependence, cigarettes, uncomplicated: Secondary | ICD-10-CM | POA: Insufficient documentation

## 2017-10-15 DIAGNOSIS — E213 Hyperparathyroidism, unspecified: Secondary | ICD-10-CM | POA: Diagnosis not present

## 2017-10-15 DIAGNOSIS — Z888 Allergy status to other drugs, medicaments and biological substances status: Secondary | ICD-10-CM | POA: Insufficient documentation

## 2017-10-15 DIAGNOSIS — I132 Hypertensive heart and chronic kidney disease with heart failure and with stage 5 chronic kidney disease, or end stage renal disease: Secondary | ICD-10-CM | POA: Insufficient documentation

## 2017-10-15 DIAGNOSIS — K219 Gastro-esophageal reflux disease without esophagitis: Secondary | ICD-10-CM | POA: Diagnosis not present

## 2017-10-15 DIAGNOSIS — F0390 Unspecified dementia without behavioral disturbance: Secondary | ICD-10-CM | POA: Insufficient documentation

## 2017-10-15 DIAGNOSIS — R05 Cough: Secondary | ICD-10-CM

## 2017-10-15 DIAGNOSIS — Z791 Long term (current) use of non-steroidal anti-inflammatories (NSAID): Secondary | ICD-10-CM | POA: Diagnosis not present

## 2017-10-15 DIAGNOSIS — I509 Heart failure, unspecified: Secondary | ICD-10-CM | POA: Insufficient documentation

## 2017-10-15 DIAGNOSIS — I480 Paroxysmal atrial fibrillation: Secondary | ICD-10-CM | POA: Diagnosis present

## 2017-10-15 DIAGNOSIS — R059 Cough, unspecified: Secondary | ICD-10-CM

## 2017-10-15 LAB — CBC WITH DIFFERENTIAL/PLATELET
Basophils Absolute: 0.1 10*3/uL (ref 0.0–0.1)
Basophils Relative: 2 %
EOS ABS: 0 10*3/uL (ref 0.0–0.7)
EOS PCT: 0 %
HEMATOCRIT: 35.9 % — AB (ref 39.0–52.0)
HEMOGLOBIN: 11.2 g/dL — AB (ref 13.0–17.0)
LYMPHS ABS: 0.7 10*3/uL (ref 0.7–4.0)
LYMPHS PCT: 21 %
MCH: 29.9 pg (ref 26.0–34.0)
MCHC: 31.2 g/dL (ref 30.0–36.0)
MCV: 95.7 fL (ref 78.0–100.0)
MONO ABS: 0.3 10*3/uL (ref 0.1–1.0)
MONOS PCT: 8 %
Neutro Abs: 2.2 10*3/uL (ref 1.7–7.7)
Neutrophils Relative %: 70 %
Platelets: 62 10*3/uL — ABNORMAL LOW (ref 150–400)
RBC: 3.75 MIL/uL — AB (ref 4.22–5.81)
RDW: 16.8 % — ABNORMAL HIGH (ref 11.5–15.5)
WBC: 3.2 10*3/uL — ABNORMAL LOW (ref 4.0–10.5)

## 2017-10-15 LAB — COMPREHENSIVE METABOLIC PANEL
ALT: 8 U/L — AB (ref 17–63)
AST: 18 U/L (ref 15–41)
Albumin: 3.7 g/dL (ref 3.5–5.0)
Alkaline Phosphatase: 58 U/L (ref 38–126)
Anion gap: 12 (ref 5–15)
BUN: 16 mg/dL (ref 6–20)
CALCIUM: 9.5 mg/dL (ref 8.9–10.3)
CHLORIDE: 99 mmol/L — AB (ref 101–111)
CO2: 28 mmol/L (ref 22–32)
CREATININE: 6.66 mg/dL — AB (ref 0.61–1.24)
GFR, EST AFRICAN AMERICAN: 9 mL/min — AB (ref >60)
GFR, EST NON AFRICAN AMERICAN: 7 mL/min — AB (ref >60)
Glucose, Bld: 74 mg/dL (ref 65–99)
Potassium: 3.5 mmol/L (ref 3.5–5.1)
Sodium: 139 mmol/L (ref 135–145)
TOTAL PROTEIN: 7.7 g/dL (ref 6.5–8.1)
Total Bilirubin: 1 mg/dL (ref 0.3–1.2)

## 2017-10-15 LAB — I-STAT CG4 LACTIC ACID, ED: LACTIC ACID, VENOUS: 1.06 mmol/L (ref 0.5–1.9)

## 2017-10-15 LAB — CBG MONITORING, ED: Glucose-Capillary: 93 mg/dL (ref 65–99)

## 2017-10-15 MED ORDER — ONDANSETRON HCL 4 MG/2ML IJ SOLN: 4.0000 mg | Freq: Four times a day (QID) | INTRAMUSCULAR | Status: DC | PRN

## 2017-10-15 MED ORDER — GUAIFENESIN ER 600 MG PO TB12
600.0000 mg | ORAL_TABLET | Freq: Two times a day (BID) | ORAL | Status: DC
  Administered 2017-10-16 (×2): 600 mg via ORAL
  Filled 2017-10-15 (×2): qty 1

## 2017-10-15 MED ORDER — ACETAMINOPHEN 650 MG RE SUPP: 650.0000 mg | Freq: Four times a day (QID) | RECTAL | Status: DC | PRN

## 2017-10-15 MED ORDER — CARVEDILOL 12.5 MG PO TABS
12.5000 mg | ORAL_TABLET | Freq: Two times a day (BID) | ORAL | Status: DC
  Administered 2017-10-16: 12.5 mg via ORAL
  Filled 2017-10-15: qty 1

## 2017-10-15 MED ORDER — ACETAMINOPHEN 325 MG PO TABS: 650.0000 mg | ORAL_TABLET | Freq: Four times a day (QID) | ORAL | Status: DC | PRN

## 2017-10-15 MED ORDER — IPRATROPIUM-ALBUTEROL 0.5-2.5 (3) MG/3ML IN SOLN
3.0000 mL | RESPIRATORY_TRACT | Status: AC
  Administered 2017-10-15: 3 mL via RESPIRATORY_TRACT
  Filled 2017-10-15: qty 3

## 2017-10-15 MED ORDER — PANTOPRAZOLE SODIUM 40 MG PO TBEC
40.0000 mg | DELAYED_RELEASE_TABLET | Freq: Every day | ORAL | Status: DC
  Administered 2017-10-16: 40 mg via ORAL
  Filled 2017-10-15: qty 1

## 2017-10-15 MED ORDER — IPRATROPIUM-ALBUTEROL 0.5-2.5 (3) MG/3ML IN SOLN: 3.0000 mL | RESPIRATORY_TRACT | Status: DC | PRN

## 2017-10-15 MED ORDER — DILTIAZEM HCL ER COATED BEADS 120 MG PO CP24
120.0000 mg | ORAL_CAPSULE | Freq: Every day | ORAL | Status: DC
  Administered 2017-10-16: 120 mg via ORAL
  Filled 2017-10-15: qty 1

## 2017-10-15 MED ORDER — ONDANSETRON HCL 4 MG PO TABS: 4.0000 mg | ORAL_TABLET | Freq: Four times a day (QID) | ORAL | Status: DC | PRN

## 2017-10-15 MED ORDER — DICLOFENAC SODIUM 1 % TD GEL
2.0000 g | Freq: Four times a day (QID) | TRANSDERMAL | Status: DC | PRN
  Filled 2017-10-15: qty 100

## 2017-10-15 MED ORDER — ALLOPURINOL 100 MG PO TABS
100.0000 mg | ORAL_TABLET | Freq: Every day | ORAL | Status: DC
  Administered 2017-10-16: 100 mg via ORAL
  Filled 2017-10-15: qty 1

## 2017-10-15 NOTE — ED Triage Notes (Signed)
Pt arrives via [facility] from dialysis with reports of increased confusion. Pt received 2.5/4 hours of dialysis but continued to  become more altered per staff. Hx of dementia. Alert to self. BP 176/70, HR 66, 95% RA, RR 16, CBG 93 with EMS. 12 lead unremarkable. Pt blind, hx of cardiac arrest. Unknown baseline.

## 2017-10-15 NOTE — ED Notes (Signed)
Pt undressed fully and placed on monitor.

## 2017-10-15 NOTE — ED Notes (Signed)
Phlebotomy at bedside.

## 2017-10-15 NOTE — ED Notes (Addendum)
RN stated that Pt doesn't make urine

## 2017-10-15 NOTE — ED Notes (Signed)
Patient returned from CT

## 2017-10-15 NOTE — ED Notes (Signed)
ED Provider at bedside. 

## 2017-10-15 NOTE — ED Notes (Signed)
Patient visualized standing at side of bed- wife called out for assistance. Patient became agitated repeatedly states "i need you to listen to me" but does not follow up after prompting. This RN and tech assisted o

## 2017-10-15 NOTE — ED Notes (Signed)
Patient transported to CT 

## 2017-10-15 NOTE — ED Provider Notes (Signed)
[facility] EMERGENCY DEPARTMENT Provider Note  CSN: [id number] Arrival date & time: 10/15/17  1536 History   Chief Complaint Chief Complaint  Patient presents with  . Altered Mental Status   HPI Cameron Mendez is a 71 y.o. male with PMH of dementia who was sent to the ED from dialysis after he appeared to be more confused than usual. The family states that for the past week the patient has had increased generalized weakness and has not been eating or drinking very much.  The patient's wife denies that he has had any recent fevers or chills.  He has not had any coughing.  The patient does not make any urine however the wife states that he is complained of feeling the urge to urinate.  Wife states that the patient does not get up and move around as much as he normally does.  HPI  Past Medical History:  Diagnosis Date  . Anemia   . Arthritis    Gout  . Blind   . BPH (benign prostatic hyperplasia)   . Cardiac arrest ([place])   . CHF (congestive heart failure) ([place])   . CKD (chronic kidney disease), stage IV ([place])    a. L upper extremity AV fistula created 07/2012.  . Colon polyps   . COPD (chronic obstructive pulmonary disease) ([place])   . Diastolic dysfunction   . Gangrene of finger ([place])    a. 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  . GI (gastrointestinal bleed) feb 2016  . Hypertension    dx--"long time"  . Irregular heart rate 03/03/2015  . Pericardial effusion    a. Noted on echo 10/2009. b. Again seen on echo 09/2013.  [name] Retinitis pigmentosa    Blindness--has shunt --placed yrs ago in [place]..  . Stroke (cerebrum) ([place])    12/19/16 CT scan revealed old left lacunar infarct    Patient Active Problem List   Diagnosis Date Noted  . Encephalopathy   . GI bleed 12/15/2016  . Hypokalemia 12/15/2016  . Gastrointestinal hemorrhage   . Symptomatic anemia 11/18/2015  . Acute coronary syndrome ([place]) 09/28/2015  . HCAP (healthcare-associated pneumonia)  09/18/2015  . Cardiac arrest ([place]) 09/13/2015  . Paroxysmal atrial fibrillation ([place]) 03/03/2015  . Tobacco abuse 01/10/2015  . Pleural effusion 01/09/2015  . Chronic diastolic CHF (congestive heart failure) ([place]) 01/08/2015  . History of adenomatous polyp of colon 12/31/2014  . Leukopenia 08/21/2014  . COPD (chronic obstructive pulmonary disease) ([place]) 05/12/2014  . COPD exacerbation ([place]) 04/29/2014  . History of pericardiocentesis 04/27/2014  . Volume overload 04/27/2014  . Anemia 09/18/2013  . Thrombocytopenia ([place]) 09/18/2013  . ESRD on hemodialysis ([place]) 09/18/2013  . Essential hypertension, benign 10/04/2009  . BLINDNESS, [place], [place] DEFINITION 10/01/2009  . OSTEOARTHRITIS 10/01/2009  . Gout, unspecified 09/28/2009    Past Surgical History:  Procedure Laterality Date  . CATARACT EXTRACTION W/ INTRAOCULAR LENS  IMPLANT, BILATERAL Bilateral   . KNEE LIGAMENT RECONSTRUCTION Left   . PERICARDIOCENTESIS  09/2013    Home Medications    Prior to Admission medications   Medication Sig Start Date End Date Taking? Authorizing Provider  allopurinol (ZYLOPRIM) 100 MG tablet Take 100 mg by mouth daily.    Yes [provider]  carvedilol (COREG) 12.5 MG tablet Take 1 tablet (12.5 mg total) by mouth 2 (two) times daily with a meal. 09/21/15  Yes Bonnielee Haff, MD  diclofenac sodium (VOLTAREN) 1 % GEL Apply topically to back or knees every day as  needed for pain   Yes [provider]  diltiazem (CARDIZEM CD) 120 MG 24 hr capsule Take 1 capsule (120 mg total) by mouth daily. 10/01/15  Yes Dixie Dials, MD  doxercalciferol (HECTOROL) 4 MCG/2ML injection Inject 0.50ml into the vein every Monday, Wednesday and Friday with hemodialysis   Yes [provider]  ferric gluconate 125 mg in sodium chloride 0.9 % 100 mL Inject 125mg  into the vein every Monday, Wednesday and Friday with hemodialysis   Yes [provider]  Amino Acids-Protein Hydrolys (FEEDING  SUPPLEMENT, PRO-STAT SUGAR FREE 64,) LIQD Take 30 mLs by mouth 2 (two) times daily with a meal.    [provider]  pantoprazole (PROTONIX) 40 MG tablet Take 1 tablet (40 mg total) by mouth daily. Switch for any other PPI at similar dose and frequency Patient not taking: Reported on 10/15/2017 12/21/16   Dhungel, Flonnie Overman, MD  SPIRIVA RESPIMAT 2.5 MCG/ACT AERS INHALE 2 PUFFS INTO THE LUNGS EVERY MORNING Patient not taking: Reported on 10/15/2017 08/03/15   [name], MD   Family History Family History  Problem Relation Age of Onset  . Ovarian cancer Mother   . Colon polyps Brother   . Colon cancer Brother   . Kidney disease Neg Hx   . Gallbladder disease Neg Hx   . Esophageal cancer Neg Hx   . Heart disease Neg Hx   . Stomach cancer Neg Hx    Social History Social History   Tobacco Use  . Smoking status: Current Every Day Smoker    Packs/day: 0.50    Years: 50.00    Pack years: 25.00    Types: Cigarettes  . Smokeless tobacco: Never Used  . Tobacco comment: Pt given handout on how to quit smoking 12/31/14  Substance Use Topics  . Alcohol use: No    Alcohol/week: 0.0 oz    Comment: "quit driinking in ~ 2000"  . Drug use: No    Allergies   Ibuprofen and Nsaids  Review of Systems Review of Systems  Constitutional: Positive for fatigue. Negative for chills and fever.  HENT: Negative for ear pain and sore throat.   Eyes: Negative for pain and visual disturbance.  Respiratory: Negative for cough and shortness of breath.   Cardiovascular: Negative for chest pain and palpitations.  Gastrointestinal: Negative for abdominal pain and vomiting.  Genitourinary: Negative for dysuria and hematuria.  Musculoskeletal: Negative for arthralgias and back pain.  Skin: Negative for color change and rash.  Neurological: Positive for weakness. Negative for seizures and syncope.  Psychiatric/Behavioral: Positive for confusion.  All other systems reviewed and are  negative.  Physical Exam Updated Vital Signs BP (!) 162/59   Pulse 72   Temp 98.2 F (36.8 C) (Oral)   Resp (!) 21   SpO2 94%   Physical Exam  Constitutional: He appears well-developed and well-nourished.  HENT:  Head: Normocephalic and atraumatic.  Eyes: Conjunctivae are normal.  Neck: Neck supple.  Cardiovascular: Normal rate and regular rhythm.  No murmur heard. Pulmonary/Chest: Effort normal and breath sounds normal. No respiratory distress.  Abdominal: Soft. There is no tenderness.  Musculoskeletal: Normal range of motion. He exhibits no edema, tenderness or deformity.  Neurological: He is alert. He exhibits normal muscle tone.  Skin: Skin is warm and dry.  Psychiatric: He has a normal mood and affect.  Nursing note and vitals reviewed.  ED Treatments / Results  Labs (all labs ordered are listed, but only abnormal results are displayed) Labs Reviewed  CBC WITH DIFFERENTIAL/PLATELET - Abnormal; Notable for the following components:      Result Value   WBC 3.2 (*)    RBC 3.75 (*)    Hemoglobin 11.2 (*)    HCT 35.9 (*)    RDW 16.8 (*)    Platelets 62 (*)    All other components within normal limits  COMPREHENSIVE METABOLIC PANEL - Abnormal; Notable for the following components:   Chloride 99 (*)    Creatinine, Ser 6.66 (*)    ALT 8 (*)    GFR calc non Af Amer 7 (*)    GFR calc Af Amer 9 (*)    All other components within normal limits  URINALYSIS, ROUTINE W REFLEX MICROSCOPIC  CBG MONITORING, ED   EKG  EKG Interpretation None       Radiology Ct Head Wo Contrast  Result Date: 10/15/2017 CLINICAL DATA:  71 y/o  M; altered mental status. EXAM: CT HEAD WITHOUT CONTRAST TECHNIQUE: Contiguous axial images were obtained from the base of the skull through the vertex without intravenous contrast. COMPARISON:  12/19/2016 CT head. FINDINGS: Brain: No evidence of acute infarction, hemorrhage, hydrocephalus, extra-axial collection or mass lesion/mass effect. Right  parietal approach ventriculostomy catheter is stable in position with tip in the right lateral ventricle. Stable advanced chronic microvascular ischemic changes of the brain and parenchymal volume loss. Right anterior basal ganglia and right thalamus chronic lacunar infarcts. 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 Vascular: Calcific atherosclerosis of carotid siphons. No hyperdense vessel. Skull: Right parietal region burr hole postsurgical changes are unchanged in chronic. No acute finding. Sinuses/Orbits: Mild mucosal thickening within the maxillary sinuses bilaterally. Other: None. IMPRESSION: 1. No acute intracranial abnormality identified. 2. Stable advanced chronic microvascular ischemic changes and parenchymal volume loss of the brain. 3. Stable right parietal approach ventriculostomy catheter. No hydrocephalus. Electronically Signed   By: Kristine Garbe M.D.   On: 10/15/2017 21:23   Dg Chest Portable 1 View  Result Date: 10/15/2017 CLINICAL DATA:  Chest pain and cough EXAM: PORTABLE CHEST 1 VIEW COMPARISON:  Chest radiograph 12/16/2016 FINDINGS: Cardiomegaly and calcific aortic atherosclerosis. Shunt catheter tubing traverses the right chest, unchanged. Small bilateral pleural effusions mild pulmonary vascular congestion. No focal consolidation. No pneumothorax. IMPRESSION: Cardiomegaly and calcific aortic atherosclerosis (ICD10-I70.0). Mild pulmonary vascular congestion without overt edema. Electronically Signed   By: Ulyses Jarred M.D.   On: 10/15/2017 18:34   Procedures Procedures (including critical care time)  Medications Ordered in ED Medications - No data to display  Initial Impression / Assessment and Plan / ED Course  I have reviewed the triage vital signs and the nursing notes.  Pertinent labs & imaging results that were available during my care of the patient were reviewed by me and considered in my  medical decision making (see chart for details).  LATONYA KNIGHT is a 71 y.o. male who presented with worsening confusion and altered mental status.  Labs studies and imaging ordered:   CT head: no acute findings  Lab studies: CBC with mild leukopenia 3.2 similar to previous, mild chronic anemia hgb 11.2, and thrombocytopenia similar to previous, CMP remarkable for creatine of 6.66 consistent with ESRD  At this time consider admission for confusion and poor PO intake reasonable.   Other possible causes for the patients symptoms that were considered include infectious causes (encephalopathy, meningitis), tumor/abscess, toxic causes (hypoglycemia, uremai, hepatic failure, toxic ingestion), other neurologic disorders (seizure, stroke, TIA), psychiatric  causes (conversion) and trauma (ICH, spinal injury).   Labs and imaging reviewed by myself and considered in medical decision making if ordered.  Imaging interpreted by radiology.  Patient admitted in stable condition.   Pt was discussed with my attending, Dr. Winfred Leeds.  Final Clinical Impressions(s) / ED Diagnoses   Final diagnoses:  Dementia without behavioral disturbance, unspecified dementia type   ED Discharge Orders    None       Fenton Foy, MD 10/18/17 [place], Sam, MD 10/21/17 1300

## 2017-10-15 NOTE — ED Notes (Signed)
Report gotten from [facility] now, pt base line dementia more alter than normal.

## 2017-10-15 NOTE — H&P (Addendum)
History and Physical    DERWARD MARPLE QAS:[id number] DOB: March 30, 1946 DOA: 10/15/2017  Referring MD/NP/PA: Fenton Foy, MD(Resident) PCP: [name], MD  Patient coming from: Hemodialysis via EMS  Chief Complaint: Altered Mental status  HPI: Cameron Mendez is a 71 y.o. male with medical history significant of ESRD on HD, HTN, CHF, COPD, legally blind, and dementia; who presents after being found more altered than baseline during his hemodialysis session which had to be cut short. History is obtained from the patient's wife as he is unable to give his own at this time. At baseline the patient has issues with his memory, but normally is not this altered. He reportedly had been complaining of severe left lower side pain during his hemodialysis session. Wife notes that over the last 4-5 days he's had decreased overall appetite. They've been trying to give him boost and soups, but he still not been taking in much. He has also had an increased cough and she reports calling EMS because it appeared that he was having labored respirations this morning. EMS came out and noted that his lungs sounded clear at that time. Associated symptoms included complaints of chest pain earlier tonight. He only still does make a little bit of urine and had been complaining of having to use the restroom.. Patient denies having any complaints at this time.    ED Course: Upon admission into the emergency department patient was seen to be afebrile, blood pressure 162/59-187/63, respirations 12-24, heart rate 69-74, and O2 saturations 90-94% on room air. Labs revealed WBC 3.2, hemoglobin 11.2, platelets 62, potassium 3.5, BUN 16, creatinine 6.66, glucose 74. Chest x-ray showed cardiomegaly with mild pulmonary vascular congestion.  Review of Systems  Unable to perform ROS: Mental status change  Constitutional: Positive for malaise/fatigue. Negative for chills and fever.  HENT: Negative for ear discharge and sore throat.   Eyes:  Negative for pain.       Positive for blind  Respiratory: Positive for cough and wheezing.   Cardiovascular: Positive for chest pain. Negative for leg swelling.  Gastrointestinal: Negative for abdominal pain, nausea and vomiting.  Genitourinary: Negative for dysuria and frequency.  Musculoskeletal: Positive for myalgias.  Skin: Negative for itching and rash.  Neurological: Positive for weakness. Negative for focal weakness and seizures.  Endo/Heme/Allergies: Negative for polydipsia. Does not bruise/bleed easily.  Psychiatric/Behavioral: Positive for memory loss. Negative for substance abuse.    Past Medical History:  Diagnosis Date  . Anemia   . Arthritis    Gout  . Blind   . BPH (benign prostatic hyperplasia)   . Cardiac arrest ([place])   . CHF (congestive heart failure) ([place])   . CKD (chronic kidney disease), stage IV ([place])    a. L upper extremity AV fistula created 07/2012.  . Colon polyps   . COPD (chronic obstructive pulmonary disease) ([place])   . Diastolic dysfunction   . Gangrene of finger ([place])    a. 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  . GI (gastrointestinal bleed) feb 2016  . Hypertension    dx--"long time"  . Irregular heart rate 03/03/2015  . Pericardial effusion    a. Noted on echo 10/2009. b. Again seen on echo 09/2013.  [name] Retinitis pigmentosa    Blindness--has shunt --placed yrs ago in [place]..  . Stroke (cerebrum) ([place])    12/19/16 CT scan revealed old left lacunar infarct    Past Surgical History:  Procedure Laterality Date  . CATARACT EXTRACTION W/ INTRAOCULAR LENS  IMPLANT, BILATERAL Bilateral   . KNEE LIGAMENT RECONSTRUCTION Left   . PERICARDIOCENTESIS  09/2013     reports that he has been smoking cigarettes.  He has a 25.00 pack-year smoking history. he has never used smokeless tobacco. He reports that he does not drink alcohol or use drugs.  Allergies  Allergen Reactions  . Ibuprofen Other (See Comments)    Kidney problems-dialysis patient    . Nsaids Other (See Comments)    Kidney problems-dialysis patient    Family History  Problem Relation Age of Onset  . Ovarian cancer Mother   . Colon polyps Brother   . Colon cancer Brother   . Kidney disease Neg Hx   . Gallbladder disease Neg Hx   . Esophageal cancer Neg Hx   . Heart disease Neg Hx   . Stomach cancer Neg Hx     Prior to Admission medications   Medication Sig Start Date End Date Taking? Authorizing Provider  allopurinol (ZYLOPRIM) 100 MG tablet Take 100 mg by mouth daily.    Yes [provider]  carvedilol (COREG) 12.5 MG tablet Take 1 tablet (12.5 mg total) by mouth 2 (two) times daily with a meal. 09/21/15  Yes Bonnielee Haff, MD  diclofenac sodium (VOLTAREN) 1 % GEL Apply topically to back or knees every day as needed for pain   Yes [provider]  diltiazem (CARDIZEM CD) 120 MG 24 hr capsule Take 1 capsule (120 mg total) by mouth daily. 10/01/15  Yes Dixie Dials, MD  doxercalciferol (HECTOROL) 4 MCG/2ML injection Inject 0.61ml into the vein every Monday, Wednesday and Friday with hemodialysis   Yes [provider]  ferric gluconate 125 mg in sodium chloride 0.9 % 100 mL Inject 125mg  into the vein every Monday, Wednesday and Friday with hemodialysis   Yes [provider]  Amino Acids-Protein Hydrolys (FEEDING SUPPLEMENT, PRO-STAT SUGAR FREE 64,) LIQD Take 30 mLs by mouth 2 (two) times daily with a meal.    [provider]  pantoprazole (PROTONIX) 40 MG tablet Take 1 tablet (40 mg total) by mouth daily. Switch for any other PPI at similar dose and frequency Patient not taking: Reported on 10/15/2017 12/21/16   Dhungel, Flonnie Overman, MD  SPIRIVA RESPIMAT 2.5 MCG/ACT AERS INHALE 2 PUFFS INTO THE LUNGS EVERY MORNING Patient not taking: Reported on 10/15/2017 08/03/15   [name], MD    Physical Exam:  Constitutional: Elderly male who appears to be in NAD Vitals:   10/15/17 1900 10/15/17 2030 10/15/17 2100 10/15/17 2230   BP: (!) 162/59 (!) 170/54 (!) 168/62 (!) 173/68  Pulse: 72 73 74 73  Resp: (!) 21 12 15 17   Temp:      TempSrc:      SpO2: 94% 93% 92% 94%   Eyes: Blind, lids and conjunctivae normal ENMT: Mucous membranes are dry. Posterior pharynx clear of any exudate or lesions. Dentures and place. Neck: normal, supple, no masses, no thyromegaly Respiratory: Normal respiratory effort with positive crackles appreciated in both lung fields. Wheezing appears to be coming from the upper airways and not necessarily appreciated in the lung fields. Cardiovascular: Regular rate and rhythm, no murmurs / rubs / gallops. No extremity edema. 2+ pedal pulses. No carotid bruits. Left upper extremity fistula with palpable thrill present Abdomen: no tenderness, no masses palpated. No hepatosplenomegaly. Bowel sounds positive.  Musculoskeletal: no clubbing / cyanosis. No joint deformity upper and lower extremities. Good ROM, no contractures. Normal muscle tone.  Skin: no rashes, lesions, ulcers.  No induration Neurologic: CN 2-12 grossly intact. Able to move all extremities Psychiatric: Confuse and oriented  person.    Labs on Admission: I have personally reviewed following labs and imaging studies  CBC: Recent Labs  Lab 10/15/17 1732  WBC 3.2*  NEUTROABS 2.2  HGB 11.2*  HCT 35.9*  MCV 95.7  PLT 62*   Basic Metabolic Panel: Recent Labs  Lab 10/15/17 1732  NA 139  K 3.5  CL 99*  CO2 28  GLUCOSE 74  BUN 16  CREATININE 6.66*  CALCIUM 9.5   GFR: CrCl cannot be calculated (Unknown ideal weight.). Liver Function Tests: Recent Labs  Lab 10/15/17 1732  AST 18  ALT 8*  ALKPHOS 58  BILITOT 1.0  PROT 7.7  ALBUMIN 3.7   No results for input(s): LIPASE, AMYLASE in the last 168 hours. No results for input(s): AMMONIA in the last 168 hours. Coagulation Profile: No results for input(s): INR, PROTIME in the last 168 hours. Cardiac Enzymes: No results for input(s): CKTOTAL, CKMB, CKMBINDEX,  TROPONINI in the last 168 hours. BNP (last 3 results) No results for input(s): PROBNP in the last 8760 hours. HbA1C: No results for input(s): HGBA1C in the last 72 hours. CBG: Recent Labs  Lab 10/15/17 1547  GLUCAP 93   Lipid Profile: No results for input(s): CHOL, HDL, LDLCALC, TRIG, CHOLHDL, LDLDIRECT in the last 72 hours. Thyroid Function Tests: No results for input(s): TSH, T4TOTAL, FREET4, T3FREE, THYROIDAB in the last 72 hours. Anemia Panel: No results for input(s): VITAMINB12, FOLATE, FERRITIN, TIBC, IRON, RETICCTPCT in the last 72 hours. Urine analysis:    Component Value Date/Time   COLORURINE YELLOW 11/18/2015 0902   APPEARANCEUR CLOUDY (A) 11/18/2015 0902   LABSPEC 1.011 11/18/2015 0902   LABSPEC 1.015 03/09/2009 1116   PHURINE 8.5 (H) 11/18/2015 0902   GLUCOSEU NEGATIVE 11/18/2015 0902   HGBUR SMALL (A) 11/18/2015 0902   BILIRUBINUR NEGATIVE 11/18/2015 0902   BILIRUBINUR Negative 03/09/2009 1116   KETONESUR NEGATIVE 11/18/2015 0902   PROTEINUR 100 (A) 11/18/2015 0902   UROBILINOGEN 0.2 03/03/2015 1950   NITRITE NEGATIVE 11/18/2015 0902   LEUKOCYTESUR SMALL (A) 11/18/2015 0902   LEUKOCYTESUR Trace 03/09/2009 1116   Sepsis Labs: No results found for this or any previous visit (from the past 240 hour(s)).   Radiological Exams on Admission: Ct Head Wo Contrast  Result Date: 10/15/2017 CLINICAL DATA:  71 y/o  M; altered mental status. EXAM: CT HEAD WITHOUT CONTRAST TECHNIQUE: Contiguous axial images were obtained from the base of the skull through the vertex without intravenous contrast. COMPARISON:  12/19/2016 CT head. FINDINGS: Brain: No evidence of acute infarction, hemorrhage, hydrocephalus, extra-axial collection or mass lesion/mass effect. Right parietal approach ventriculostomy catheter is stable in position with tip in the right lateral ventricle. Stable advanced chronic microvascular ischemic changes of the brain and parenchymal volume loss. Right anterior  basal ganglia and right thalamus chronic lacunar infarcts. 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 Vascular: Calcific atherosclerosis of carotid siphons. No hyperdense vessel. Skull: Right parietal region burr hole postsurgical changes are unchanged in chronic. No acute finding. Sinuses/Orbits: Mild mucosal thickening within the maxillary sinuses bilaterally. Other: None. IMPRESSION: 1. No acute intracranial abnormality identified. 2. Stable advanced chronic microvascular ischemic changes and parenchymal volume loss of the brain. 3. Stable right parietal approach ventriculostomy catheter. No hydrocephalus. Electronically Signed   By: Kristine Garbe M.D.   On: 10/15/2017 21:23   Dg Chest Portable 1  View  Result Date: 10/15/2017 CLINICAL DATA:  Chest pain and cough EXAM: PORTABLE CHEST 1 VIEW COMPARISON:  Chest radiograph 12/16/2016 FINDINGS: Cardiomegaly and calcific aortic atherosclerosis. Shunt catheter tubing traverses the right chest, unchanged. Small bilateral pleural effusions mild pulmonary vascular congestion. No focal consolidation. No pneumothorax. IMPRESSION: Cardiomegaly and calcific aortic atherosclerosis (ICD10-I70.0). Mild pulmonary vascular congestion without overt edema. Electronically Signed   By: Ulyses Jarred M.D.   On: 10/15/2017 18:34    Chest x-ray: Independently reviewed.   Assessment/Plan Acute encephalopathy, H/O Dementia: Unclear cause of patient's symptoms at this time. Chest x-ray showing mild pulmonary vascular congestion, but no clear infiltrate. Patient still noted to make some urine culture, but her urinalysis obtained as of yet. Question the possibility of underlying infection. - Admit to a telemetry bed - Neuro checks - check TSH  - Check urinalysis   ESRD on HD: He reportedly only received partial dialysis session today. Patient normally receives doxercalciferol and Ferric gluconate  during hemodialysis sessions. - message left for nephrology to see in a.m.  Chest pain: Acute. Patient reportedly was complaining of chest pain by his wife, but currently denies any chest pain complaints. - Trend cardiac enzymes - May warrant Echo and cardiology consult  Chronic diastolic CHF: Patient presents with y crackles on physical exam and chest x-ray showing cardiomegaly with mild pulmonary vascular congestion.  Last EF noted to be 66% with grade 1 diastolic dysfunction, moderate TR, severe MR. Suspect an aspect of patient some of patient's symptoms including cough could be secondary to being mildly fluid overloaded.  - Strict ins and outs and daily weights - added on BNP  COPD, without exacerbation:  Patient not on any inhalers at this time. Patient noted to have crackles on physical exam, but no significant wheezing heard on lung exam. - DuoNeb's prn SOB/wheezing  Pancytopenia : Chronic. Patient presents with WBC 3.2, hemoglobin 11.2, and platelets 62. Patient had hemoglobin is improved from previous on noted to have GI bleed. Lactic acid reassuring at 1.06. - check bld cultures - recheck CBC in a.m.  Essential hypertension - Continue Coreg and Cardizem   Paroxysmal atrial fibrillation:CHADS-VASc 4 (age, CHF, HTN, CAD) not on anticoagulation secondary to chronic thrombocytopenia with history of GI bleeds. - Continue rate controlling medications as seen above   Legally blind: stable  Gerd  - Continue Protonix  DVT prophylaxis: SCDs Code Status: Full  Family Communication:  discuss plan of care with patient family present at bedside  Disposition Plan: TBD  Consults called: none Admission status: observation  Norval Morton MD [facility] Pager [phone]   If 7PM-7AM, please contact night-coverage [email or web address] Password [email or web address]  10/15/2017, 10:50 PM

## 2017-10-15 NOTE — ED Provider Notes (Signed)
Level 5 caveat dementia patient sent from dialysis center where he was reportedly more confused than his baseline.  On exam he is alert oriented to name.  Cranial nerves II through XII grossly intact.  Moves all extremities well.  Motor strength 5/5 overall.  Follows simple commands.  Lungs clear to auscultation heart regular rate and rhythm abdomen nondistended nontender.  All 4 extremities are redness finger tenderness neurovascular intact.  Left upper extremity with dialysis graft with good thrill   Orlie Dakin, MD 10/15/17 1556

## 2017-10-16 ENCOUNTER — Observation Stay (HOSPITAL_COMMUNITY): Payer: Medicare Other

## 2017-10-16 ENCOUNTER — Encounter (HOSPITAL_COMMUNITY): Payer: Self-pay | Admitting: *Deleted

## 2017-10-16 DIAGNOSIS — G9340 Encephalopathy, unspecified: Secondary | ICD-10-CM | POA: Diagnosis not present

## 2017-10-16 DIAGNOSIS — N186 End stage renal disease: Secondary | ICD-10-CM | POA: Diagnosis not present

## 2017-10-16 DIAGNOSIS — F0390 Unspecified dementia without behavioral disturbance: Secondary | ICD-10-CM | POA: Diagnosis not present

## 2017-10-16 DIAGNOSIS — Z992 Dependence on renal dialysis: Secondary | ICD-10-CM

## 2017-10-16 DIAGNOSIS — H548 Legal blindness, as defined in USA: Secondary | ICD-10-CM | POA: Diagnosis not present

## 2017-10-16 LAB — BASIC METABOLIC PANEL
ANION GAP: 11 (ref 5–15)
BUN: 22 mg/dL — AB (ref 6–20)
CO2: 30 mmol/L (ref 22–32)
Calcium: 9.5 mg/dL (ref 8.9–10.3)
Chloride: 98 mmol/L — ABNORMAL LOW (ref 101–111)
Creatinine, Ser: 7.34 mg/dL — ABNORMAL HIGH (ref 0.61–1.24)
GFR calc Af Amer: 8 mL/min — ABNORMAL LOW (ref >60)
GFR, EST NON AFRICAN AMERICAN: 7 mL/min — AB (ref >60)
GLUCOSE: 83 mg/dL (ref 65–99)
POTASSIUM: 3.4 mmol/L — AB (ref 3.5–5.1)
Sodium: 139 mmol/L (ref 135–145)

## 2017-10-16 LAB — CBC
HEMATOCRIT: 34.8 % — AB (ref 39.0–52.0)
HEMOGLOBIN: 10.6 g/dL — AB (ref 13.0–17.0)
MCH: 29.2 pg (ref 26.0–34.0)
MCHC: 30.5 g/dL (ref 30.0–36.0)
MCV: 95.9 fL (ref 78.0–100.0)
Platelets: 64 10*3/uL — ABNORMAL LOW (ref 150–400)
RBC: 3.63 MIL/uL — ABNORMAL LOW (ref 4.22–5.81)
RDW: 16.8 % — ABNORMAL HIGH (ref 11.5–15.5)
WBC: 3.1 10*3/uL — ABNORMAL LOW (ref 4.0–10.5)

## 2017-10-16 LAB — TSH: TSH: 1.797 u[IU]/mL (ref 0.350–4.500)

## 2017-10-16 LAB — BRAIN NATRIURETIC PEPTIDE: B Natriuretic Peptide: 1724.6 pg/mL — ABNORMAL HIGH (ref 0.0–100.0)

## 2017-10-16 LAB — PROCALCITONIN: PROCALCITONIN: 0.71 ng/mL

## 2017-10-16 LAB — TROPONIN I
Troponin I: 0.05 ng/mL (ref <0.03)
Troponin I: 0.04 ng/mL (ref <0.03)

## 2017-10-16 MED ORDER — CALCIUM ACETATE (PHOS BINDER) 667 MG PO CAPS: 667.0000 mg | ORAL_CAPSULE | Freq: Three times a day (TID) | ORAL | Status: DC

## 2017-10-16 MED ORDER — HYDRALAZINE HCL 20 MG/ML IJ SOLN
10.0000 mg | INTRAMUSCULAR | Status: DC | PRN
  Administered 2017-10-16: 10 mg via INTRAVENOUS
  Filled 2017-10-16: qty 1

## 2017-10-16 NOTE — ED Notes (Signed)
Report attempted at 30 min of bed assignment, RN to call back for report.

## 2017-10-16 NOTE — Evaluation (Signed)
Physical Therapy Evaluation Patient Details Name: Cameron Mendez MRN: [id number] DOB: 1946-01-13 Today's Date: 10/16/2017   History of Present Illness  Pt is a 71 y/o male admitted secondary to AMS. CT of head negative for acute abnormality and chest imaging negative for infection. PMH inlcudes dementia, ESRD on HD MWF, HTN, CHF, CVA, COPD, legally blind, and s/p L AV fistula placement.   Clinical Impression  Pt admitted secondary to problem above with deficits below. PTA, pt required use of cane and assist for guidance from wife. Also required assist for ADLs. Upon eval, pt presenting with decreased cognition, weakness, and decreased balance. Required min A for steadying assist with use of HHA and required min guard for mobility with use of RW. Educated pt and wife to use RW at home to increase stability. Recommending HHPT at d/c to increase independence and safety with functional mobility.     Follow Up Recommendations Home health PT;Supervision/Assistance - 24 hour    Equipment Recommendations  None recommended by PT    Recommendations for Other Services       Precautions / Restrictions Precautions Precautions: Fall;Other (comment) Precaution Comments: Pt legally blind  Restrictions Weight Bearing Restrictions: No      Mobility  Bed Mobility Overal bed mobility: Needs Assistance Bed Mobility: Supine to Sit;Sit to Supine     Supine to sit: Min assist Sit to supine: Min guard   General bed mobility comments: Min A for trunk elevation. Cues for sequencing as pt is blind and required directional cues.   Transfers Overall transfer level: Needs assistance Equipment used: Rolling walker (2 wheeled);1 person hand held assist Transfers: Sit to/from Stand Sit to Stand: Min assist;Min guard         General transfer comment: Sit<>stand X 2. Used HHA initially and requried min A for lift assist and steadying. With use of RW, required manual cues for safe hand placment, however,  min guard for safety for transfer.   Ambulation/Gait Ambulation/Gait assistance: Min assist;Min guard Ambulation Distance (Feet): 40 Feet Assistive device: Rolling walker (2 wheeled);1 person hand held assist Gait Pattern/deviations: Step-through pattern;Decreased stride length Gait velocity: decreased  Gait velocity interpretation: Below normal speed for age/gender General Gait Details: Slow, unsteady gait with HHA. Pt reports he uses cane at home, so use HHA to simulate cane. Min A for steadying and required directional assist as pt is blind. With use of RW, pt decreased balance assist required to min guard, however, continued to require directional assist secondary to blindness. Educated about use of RW at home and need for directional assist at home.   Stairs Stairs: (Verbally reviewed stair navigation with pt and pt's wife )          Wheelchair Mobility    Modified Rankin (Stroke Patients Only)       Balance Overall balance assessment: Needs assistance Sitting-balance support: No upper extremity supported;Feet supported Sitting balance-Leahy Scale: Fair     Standing balance support: Bilateral upper extremity supported;Single extremity supported;During functional activity Standing balance-Leahy Scale: Poor Standing balance comment: Reliant on UE support for balance                              Pertinent Vitals/Pain Pain Assessment: No/denies pain    Home Living Family/patient expects to be discharged to:: Private residence Living Arrangements: Spouse/significant other Available Help at Discharge: Family;Available 24 hours/day Type of Home: House Home Access: Stairs to enter Entrance Stairs-Rails: Right;Left Entrance  Stairs-Number of Steps: 4 Home Layout: One level Home Equipment: Shower seat;Cane - single point;Bedside commode;Walker - 2 wheels;Wheelchair - manual      Prior Function Level of Independence: Needs assistance   Gait / Transfers  Assistance Needed: Pt's iwfe reports pt uses cane and she has to help lead him   ADL's / Homemaking Assistance Needed: Wife assists with bathing and dressing         Hand Dominance   Dominant Hand: Right    Extremity/Trunk Assessment   Upper Extremity Assessment Upper Extremity Assessment: Generalized weakness    Lower Extremity Assessment Lower Extremity Assessment: Generalized weakness    Cervical / Trunk Assessment Cervical / Trunk Assessment: Kyphotic  Communication   Communication: No difficulties  Cognition Arousal/Alertness: Awake/alert Behavior During Therapy: WFL for tasks assessed/performed Overall Cognitive Status: History of cognitive impairments - at baseline                                 General Comments: Pt with dementia at baseline. Wife reports he is more confused that normal. MD present during session and per MD, no infection present, so likely progression of dementia.       General Comments General comments (skin integrity, edema, etc.): Pt's wife present during session. Educated about need for HHPT and pt and family agreeable.     Exercises     Assessment/Plan    PT Assessment Patient needs continued PT services  PT Problem List Decreased strength;Decreased balance;Decreased mobility;Decreased cognition;Decreased knowledge of use of DME;Decreased safety awareness;Decreased knowledge of precautions       PT Treatment Interventions DME instruction;Gait training;Stair training;Functional mobility training;Therapeutic activities;Balance training;Therapeutic exercise;Neuromuscular re-education;Patient/family education;Cognitive remediation    PT Goals (Current goals can be found in the Care Plan section)  Acute Rehab PT Goals Patient Stated Goal: to go home  PT Goal Formulation: With patient/family Time For Goal Achievement: 10/23/17 Potential to Achieve Goals: Good    Frequency Min 3X/week   Barriers to discharge         Co-evaluation               AM-PAC PT "6 Clicks" Daily Activity  Outcome Measure Difficulty turning over in bed (including adjusting bedclothes, sheets and blankets)?: A Little Difficulty moving from lying on back to sitting on the side of the bed? : Unable Difficulty sitting down on and standing up from a chair with arms (e.g., wheelchair, bedside commode, etc,.)?: Unable Help needed moving to and from a bed to chair (including a wheelchair)?: A Little Help needed walking in hospital room?: A Little Help needed climbing 3-5 steps with a railing? : A Lot 6 Click Score: 13    End of Session Equipment Utilized During Treatment: Gait belt Activity Tolerance: Patient tolerated treatment well Patient left: in bed;with call bell/phone within reach;with bed alarm set;with family/visitor present Nurse Communication: Mobility status PT Visit Diagnosis: Unsteadiness on feet (R26.81);Muscle weakness (generalized) (M62.81)    Time: 7510-2585 PT Time Calculation (min) (ACUTE ONLY): 27 min   Charges:   PT Evaluation $PT Eval Low Complexity: 1 Low PT Treatments $Gait Training: 8-22 mins   PT G Codes:   PT G-Codes **NOT FOR INPATIENT CLASS** Functional Assessment Tool Used: Clinical judgement;AM-PAC 6 Clicks Basic Mobility Functional Limitation: Mobility: Walking and moving around Mobility: Walking and Moving Around Current Status (I7782): At least 40 percent but less than 60 percent impaired, limited or restricted Mobility: Walking and  Moving Around Goal Status [phone]): At least 1 percent but less than 20 percent impaired, limited or restricted    Leighton Ruff, PT, DPT  Acute Rehabilitation Services  Pager: [place] 10/16/2017, 3:14 PM

## 2017-10-16 NOTE — Progress Notes (Signed)
Cameron Mendez Stands to be D/C'd Home per MD order.  Discussed with the caregiver and patient and all questions fully answered.  VSS, Skin clean, dry and intact without evidence of skin break down, no evidence of skin tears noted. IV catheter discontinued intact. Site without signs and symptoms of complications. Dressing and pressure applied.  An After Visit Summary was printed and given to the patient's spouse.   D/c education completed with patient/family including follow up instructions, medication list, d/c activities limitations if indicated, with other d/c instructions as indicated by MD - patient able to verbalize understanding, all questions fully answered.   Patient and caregiver instructed to return to ED, call 911, or call MD for any changes in condition.   Patient escorted via [place], and D/C home via private auto.  Sharion Balloon 10/16/2017 4:09 PM

## 2017-10-16 NOTE — ED Notes (Signed)
Spoke to Dr.Pupo, concerning fluctuating blood pressure. MD to place order.

## 2017-10-16 NOTE — ED Notes (Signed)
Report given to [place], RN on 2W, HD pt admitted for Acute Encephalopathy, RN concern about pt's high BP 168/60 and asking why ED didn't treat for high BP, RN oriented that ED given treatment for the main concern and when pt's gets admitted, pt's get treatment for chronic conditions, pt just got HD today and sent to ED for AMS, CT head done on the ED and neuro deficit rule out. Pt base line confuse with dementia no other neuro deficit noticed on ED. No order for BP medication gotten for BP medication, pt's regular medication to be given on the unit.

## 2017-10-16 NOTE — Care Management Note (Addendum)
Case Management Note  Patient Details  Name: Cameron Mendez MRN: [id number] Date of Birth: Jun 06, 1946  Subjective/Objective:   Pt in with acute encephalopathy from home with his wife. Pt has cane, walker, wheelchair at home.                 Action/Plan: Pt discharging home with [facility] services. CM met with the patient and his wife and provided choice on [facility] agencies. They have used [place] in the past and would like to use them again. Butch Penny with [facility] notified and accepted the referral.  Pts wife and daughter to provide transportation home today. CM also consulted for home palliative care. CM called [facility] and they will not accepted an order/ referral from  [facility]. They have to have referral/ order from patient's PCP. CM called patients PCP (Dr Karren Burly) office and notified them of recommendations of the [facility] and asked them to fax an order to [facility] in Dr Berdine Addison is in agreement. [place] fax: [phone] given to Dr Rosezena Sensor.   Expected Discharge Date:  10/16/17               Expected Discharge Plan:  [place]  In-House Referral:     Discharge planning Services  CM Consult  Post Acute Care Choice:  Home Health Choice offered to:  Patient, Spouse  DME Arranged:    DME Agency:     HH Arranged:  PT [place]:  [place]  Status of Service:  Completed, signed off  If discussed at [place] of Stay Meetings, dates discussed:    Additional Comments:  Pollie Friar, RN 10/16/2017, 3:25 PM

## 2017-10-16 NOTE — Discharge Summary (Signed)
Physician Discharge Summary  Cameron Mendez EUM:[id number] DOB: 12/14/1945 DOA: 10/15/2017  PCP: [name], MD  Admit date: 10/15/2017 Discharge date: 10/16/2017   Recommendations for Outpatient Follow-Up:   1. Home health 2. outpatient palliative care referral   Discharge Diagnosis:   Principal Problem:   Acute encephalopathy Active Problems:   BLINDNESS, LEGAL, [place] DEFINITION   Thrombocytopenia ([place])   ESRD on hemodialysis ([place])   Pancytopenia ([place])   Paroxysmal atrial fibrillation [facility])   Discharge disposition:  Home.:  Discharge Condition: Improved.  Diet recommendation: resume home meds  Wound care: None.   History of Present Illness:   Cameron Mendez is a 71 y.o. male with medical history significant of ESRD on HD, HTN, CHF, COPD, legally blind, and dementia; who presents after being found more altered than baseline during his hemodialysis session which had to be cut short. History is obtained from the patient's wife as he is unable to give his own at this time. At baseline the patient has issues with his memory, but normally is not this altered. He reportedly had been complaining of severe left lower side pain during his hemodialysis session. Wife notes that over the last 4-5 days he's had decreased overall appetite. They've been trying to give him boost and soups, but he still not been taking in much. He has also had an increased cough and she reports calling EMS because it appeared that he was having labored respirations this morning. EMS came out and noted that his lungs sounded clear at that time. Associated symptoms included complaints of chest pain earlier tonight. He only still does make a little bit of urine and had been complaining of having to use the restroom.. Patient denies having any complaints at this time.      Hospital Course by Problem:   Dementia -seems to be back at baseline - does not have any clear focus of infection to suggest acute  illness delirium -Electrolytes revealed a within acceptable limits for end-stage renal disease. -seen by PT-- home health/pallaitive care consult  ESRD -continue HD for now -discussed dementia with wife-- may need to stop HD when no longer able to tolerate -would want to avoid hypotension with HD   Medical Consultants:    renal.   Discharge Exam:   Vitals:   10/16/17 0505 10/16/17 0900  BP: (!) 171/42 (!) 168/58  Pulse: 72 70  Resp: 18 18  Temp: 98.7 F (37.1 C)   SpO2: 93% 95%   Vitals:   10/16/17 0110 10/16/17 0326 10/16/17 0505 10/16/17 0900  BP: (!) 193/56 (!) 157/47 (!) 171/42 (!) 168/58  Pulse: 73  72 70  Resp: 19  18 18   Temp: 98.2 F (36.8 C)  98.7 F (37.1 C)   TempSrc: Oral  Oral Oral  SpO2: 95%  93% 95%  Weight: 60.6 kg (133 lb 11.2 oz)       Gen:  NAD   The results of significant diagnostics from this hospitalization (including imaging, microbiology, ancillary and laboratory) are listed below for reference.     Procedures and Diagnostic Studies:   Dg Chest 2 View  Result Date: 10/16/2017 CLINICAL DATA:  Acute encephalopathy, end-stage renal disease on dialysis. Paroxysms mole atrial fibrillation. History of COPD. EXAM: CHEST  2 VIEW COMPARISON:  Portable chest x-ray of October 15, 2017 FINDINGS: The lungs are well-expanded. The interstitial markings remain mildly increased. There is no alveolar infiltrate. There is no pleural effusion. The cardiac silhouette remains enlarged. The pulmonary  vascularity remains engorged but is more distinct. There is calcification in the wall of the thoracic aorta. A ventriculoperitoneal shunt tube is present. IMPRESSION: CHF with slightly decreased pulmonary vascular congestion. No pneumonia. Thoracic aortic atherosclerosis. Electronically Signed   By: David  Martinique M.D.   On: 10/16/2017 14:20   Ct Head Wo Contrast  Result Date: 10/15/2017 CLINICAL DATA:  71 y/o  M; altered mental status. EXAM: CT HEAD WITHOUT CONTRAST  TECHNIQUE: Contiguous axial images were obtained from the base of the skull through the vertex without intravenous contrast. COMPARISON:  12/19/2016 CT head. FINDINGS: Brain: No evidence of acute infarction, hemorrhage, hydrocephalus, extra-axial collection or mass lesion/mass effect. Right parietal approach ventriculostomy catheter is stable in position with tip in the right lateral ventricle. Stable advanced chronic microvascular ischemic changes of the brain and parenchymal volume loss. Right anterior basal ganglia and right thalamus chronic lacunar infarcts. 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 Vascular: Calcific atherosclerosis of carotid siphons. No hyperdense vessel. Skull: Right parietal region burr hole postsurgical changes are unchanged in chronic. No acute finding. Sinuses/Orbits: Mild mucosal thickening within the maxillary sinuses bilaterally. Other: None. IMPRESSION: 1. No acute intracranial abnormality identified. 2. Stable advanced chronic microvascular ischemic changes and parenchymal volume loss of the brain. 3. Stable right parietal approach ventriculostomy catheter. No hydrocephalus. Electronically Signed   By: Kristine Garbe M.D.   On: 10/15/2017 21:23   Dg Chest Portable 1 View  Result Date: 10/15/2017 CLINICAL DATA:  Chest pain and cough EXAM: PORTABLE CHEST 1 VIEW COMPARISON:  Chest radiograph 12/16/2016 FINDINGS: Cardiomegaly and calcific aortic atherosclerosis. Shunt catheter tubing traverses the right chest, unchanged. Small bilateral pleural effusions mild pulmonary vascular congestion. No focal consolidation. No pneumothorax. IMPRESSION: Cardiomegaly and calcific aortic atherosclerosis (ICD10-I70.0). Mild pulmonary vascular congestion without overt edema. Electronically Signed   By: Ulyses Jarred M.D.   On: 10/15/2017 18:34     Labs:   Basic Metabolic Panel: Recent Labs  Lab 10/15/17 1732  10/16/17 0450  NA 139 139  K 3.5 3.4*  CL 99* 98*  CO2 28 30  GLUCOSE 74 83  BUN 16 22*  CREATININE 6.66* 7.34*  CALCIUM 9.5 9.5   GFR CrCl cannot be calculated (Unknown ideal weight.). Liver Function Tests: Recent Labs  Lab 10/15/17 1732  AST 18  ALT 8*  ALKPHOS 58  BILITOT 1.0  PROT 7.7  ALBUMIN 3.7   No results for input(s): LIPASE, AMYLASE in the last 168 hours. No results for input(s): AMMONIA in the last 168 hours. Coagulation profile No results for input(s): INR, PROTIME in the last 168 hours.  CBC: Recent Labs  Lab 10/15/17 1732 10/16/17 0451  WBC 3.2* 3.1*  NEUTROABS 2.2  --   HGB 11.2* 10.6*  HCT 35.9* 34.8*  MCV 95.7 95.9  PLT 62* 64*   Cardiac Enzymes: Recent Labs  Lab 10/16/17 0450 10/16/17 1033  TROPONINI 0.05* 0.04*   BNP: Invalid input(s): POCBNP CBG: Recent Labs  Lab 10/15/17 1547  GLUCAP 93   D-Dimer No results for input(s): DDIMER in the last 72 hours. Hgb A1c No results for input(s): HGBA1C in the last 72 hours. Lipid Profile No results for input(s): CHOL, HDL, LDLCALC, TRIG, CHOLHDL, LDLDIRECT in the last 72 hours. Thyroid function studies Recent Labs    10/16/17 0450  TSH 1.797   Anemia work up No results for input(s): VITAMINB12, FOLATE, FERRITIN, TIBC, IRON, RETICCTPCT in the last 72 hours. Microbiology No  results found for this or any previous visit (from the past 240 hour(s)).   Discharge Instructions:   Discharge Instructions    Discharge instructions   Complete by:  As directed    Home health Resume HD in the AM Resume home diet   Increase activity slowly   Complete by:  As directed      Allergies as of 10/16/2017      Reactions   Ibuprofen Other (See Comments)   Kidney problems-dialysis patient   Nsaids Other (See Comments)   Kidney problems-dialysis patient      Medication List    TAKE these medications   allopurinol 100 MG tablet Commonly known as:  ZYLOPRIM Take 100 mg by mouth daily.     carvedilol 12.5 MG tablet Commonly known as:  COREG Take 1 tablet (12.5 mg total) by mouth 2 (two) times daily with a meal.   diclofenac sodium 1 % Gel Commonly known as:  VOLTAREN Apply topically to back or knees every day as needed for pain   diltiazem 120 MG 24 hr capsule Commonly known as:  CARDIZEM CD Take 1 capsule (120 mg total) by mouth daily.   doxercalciferol 4 MCG/2ML injection Commonly known as:  HECTOROL Inject 0.1ml into the vein every Monday, Wednesday and Friday with hemodialysis   feeding supplement (PRO-STAT SUGAR FREE 64) Liqd Take 30 mLs by mouth 2 (two) times daily with a meal.   ferric gluconate 125 mg in sodium chloride 0.9 % 100 mL Inject 125mg  into the vein every Monday, Wednesday and Friday with hemodialysis   pantoprazole 40 MG tablet Commonly known as:  PROTONIX Take 1 tablet (40 mg total) by mouth daily. Switch for any other PPI at similar dose and frequency   SPIRIVA RESPIMAT 2.5 MCG/ACT Aers Generic drug:  Tiotropium Bromide Monohydrate INHALE 2 PUFFS INTO THE LUNGS EVERY MORNING      Follow-up Information    [name], MD Follow up in 1 week(s).   Specialty:  Family Medicine Contact information: [address] [place] [address] [phone]            Time coordinating discharge: 25 min  Signed:  Briseida Gittings Mendez Sihaam Chrobak   [facility] 10/16/2017, 2:51 PM

## 2017-10-16 NOTE — Consult Note (Signed)
[facility] Renal Consultation Note    Indication for Consultation:  Management of ESRD/hemodialysis; anemia, hypertension/volume and secondary hyperparathyroidism PCP:  HPI: Cameron Mendez is a 71 y.o. male with ESRD on HD, HTN, CHF, COPD, blindness, dementia, Hx CVA, Hx GI bleed.  He is admitted under observation status with acute encephalopathy. Was sent to ED from his dialysis center with increased confusion/AMS during HD. Head CT with no acute findings. CXR with mild pulmonary vascular congestion. Labs Na 139, K 3.4, Ca 9.5, WBC 3.1, Hgb 10.6.   Seen in room this morning. Alert, appears comfortable. Does not remember coming to the hospital or why he is here.  Has no complaints this morning. Denies fever, chills, CP, SOB, abdominal pain, N,V,D.   Dialyzes at [facility] MWF.  Last HD was Monday for 2:35. Usually compliant with HD. Left 2.6kg over his EDW   Past Medical History:  Diagnosis Date  . Anemia   . Arthritis    Gout  . Blind   . BPH (benign prostatic hyperplasia)   . Cardiac arrest ([place])   . CHF (congestive heart failure) ([place])   . CKD (chronic kidney disease), stage IV ([place])    a. L upper extremity AV fistula created 07/2012.  . Colon polyps   . COPD (chronic obstructive pulmonary disease) ([place])   . Diastolic dysfunction   . Gangrene of finger ([place])    a. 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  . GI (gastrointestinal bleed) feb 2016  . Hypertension    dx--"long time"  . Irregular heart rate 03/03/2015  . Pericardial effusion    a. Noted on echo 10/2009. b. Again seen on echo 09/2013.  [name] Retinitis pigmentosa    Blindness--has shunt --placed yrs ago in [place]..  . Stroke (cerebrum) ([place])    12/19/16 CT scan revealed old left lacunar infarct   Past Surgical History:  Procedure Laterality Date  . CATARACT EXTRACTION W/ INTRAOCULAR LENS  IMPLANT, BILATERAL Bilateral   . KNEE LIGAMENT RECONSTRUCTION Left   . PERICARDIOCENTESIS   09/2013   Family History  Problem Relation Age of Onset  . Ovarian cancer Mother   . Colon polyps Brother   . Colon cancer Brother   . Kidney disease Neg Hx   . Gallbladder disease Neg Hx   . Esophageal cancer Neg Hx   . Heart disease Neg Hx   . Stomach cancer Neg Hx    Social History:  reports that he has been smoking cigarettes.  He has a 25.00 pack-year smoking history. he has never used smokeless tobacco. He reports that he does not drink alcohol or use drugs. Allergies  Allergen Reactions  . Ibuprofen Other (See Comments)    Kidney problems-dialysis patient  . Nsaids Other (See Comments)    Kidney problems-dialysis patient   Prior to Admission medications   Medication Sig Start Date End Date Taking? Authorizing Provider  allopurinol (ZYLOPRIM) 100 MG tablet Take 100 mg by mouth daily.    Yes [provider]  carvedilol (COREG) 12.5 MG tablet Take 1 tablet (12.5 mg total) by mouth 2 (two) times daily with a meal. 09/21/15  Yes Bonnielee Haff, MD  diclofenac sodium (VOLTAREN) 1 % GEL Apply topically to back or knees every day as needed for pain   Yes [provider]  diltiazem (CARDIZEM CD) 120 MG 24 hr capsule Take 1 capsule (120 mg total) by mouth daily. 10/01/15  Yes Dixie Dials, MD  doxercalciferol (HECTOROL) 4  MCG/2ML injection Inject 0.81ml into the vein every Monday, Wednesday and Friday with hemodialysis   Yes [provider]  ferric gluconate 125 mg in sodium chloride 0.9 % 100 mL Inject 125mg  into the vein every Monday, Wednesday and Friday with hemodialysis   Yes [provider]  Amino Acids-Protein Hydrolys (FEEDING SUPPLEMENT, PRO-STAT SUGAR FREE 64,) LIQD Take 30 mLs by mouth 2 (two) times daily with a meal.    [provider]  pantoprazole (PROTONIX) 40 MG tablet Take 1 tablet (40 mg total) by mouth daily. Switch for any other PPI at similar dose and frequency Patient not taking: Reported on 10/15/2017 12/21/16   Dhungel,  Flonnie Overman, MD  SPIRIVA RESPIMAT 2.5 MCG/ACT AERS INHALE 2 PUFFS INTO THE LUNGS EVERY MORNING Patient not taking: Reported on 10/15/2017 08/03/15   [name], MD   Current Facility-Administered Medications  Medication Dose Route Frequency Provider Last Rate Last Dose  . acetaminophen (TYLENOL) tablet 650 mg  650 mg Oral Q6H PRN Norval Morton, MD       Or  . acetaminophen (TYLENOL) suppository 650 mg  650 mg Rectal Q6H PRN Fuller Plan A, MD      . allopurinol (ZYLOPRIM) tablet 100 mg  100 mg Oral Daily Sebek, Rondell A, MD      . carvedilol (COREG) tablet 12.5 mg  12.5 mg Oral BID WC Hottenstein, Rondell A, MD      . diclofenac sodium (VOLTAREN) 1 % transdermal gel 2 g  2 g Topical QID PRN Fuller Plan A, MD      . diltiazem (CARDIZEM CD) 24 hr capsule 120 mg  120 mg Oral Daily Dattilio, Rondell A, MD      . guaiFENesin (MUCINEX) 12 hr tablet 600 mg  600 mg Oral BID Tamala Julian, Rondell A, MD   600 mg at 10/16/17 0144  . hydrALAZINE (APRESOLINE) injection 10 mg  10 mg Intravenous Q4H PRN Fuller Plan A, MD   10 mg at 10/16/17 0137  . ipratropium-albuterol (DUONEB) 0.5-2.5 (3) MG/3ML nebulizer solution 3 mL  3 mL Nebulization Q4H PRN Conover, Rondell A, MD      . ondansetron (ZOFRAN) tablet 4 mg  4 mg Oral Q6H PRN Fuller Plan A, MD       Or  . ondansetron (ZOFRAN) injection 4 mg  4 mg Intravenous Q6H PRN Rodak, Rondell A, MD      . pantoprazole (PROTONIX) EC tablet 40 mg  40 mg Oral Daily Gervasi, Rondell A, MD         ROS: As per HPI otherwise negative.  Physical Exam: Vitals:   10/16/17 0110 10/16/17 0326 10/16/17 0505 10/16/17 0900  BP: (!) 193/56 (!) 157/47 (!) 171/42 (!) 168/58  Pulse: 73  72 70  Resp: 19  18 18   Temp: 98.2 F (36.8 C)  98.7 F (37.1 C)   TempSrc: Oral  Oral Oral  SpO2: 95%  93% 95%  Weight: 60.6 kg (133 lb 11.2 oz)        General: Elderly male blind, resting in bed NAD  Head: NCAT sclera not icteric MMM Neck: Supple. No JVD No masses Lungs: CTA bilaterally  without wheezes, rales, or rhonchi. Breathing is unlabored. Heart: RRR with S1 S2 Abdomen: soft NT + BS Lower extremities:without edema or ischemic changes, no open wounds  Neuro: Oriented to self only  Psych:  Responds to questions appropriately with a normal affect. Dialysis Access: LUE AVF +thrill   Labs: Basic Metabolic Panel: Recent Labs  Lab 10/15/17 1732 10/16/17 0450  NA 139 139  K 3.5 3.4*  CL 99* 98*  CO2 28 30  GLUCOSE 74 83  BUN 16 22*  CREATININE 6.66* 7.34*  CALCIUM 9.5 9.5   Liver Function Tests: Recent Labs  Lab 10/15/17 1732  AST 18  ALT 8*  ALKPHOS 58  BILITOT 1.0  PROT 7.7  ALBUMIN 3.7   No results for input(s): LIPASE, AMYLASE in the last 168 hours. No results for input(s): AMMONIA in the last 168 hours. CBC: Recent Labs  Lab 10/15/17 1732 10/16/17 0451  WBC 3.2* 3.1*  NEUTROABS 2.2  --   HGB 11.2* 10.6*  HCT 35.9* 34.8*  MCV 95.7 95.9  PLT 62* 64*   Cardiac Enzymes: Recent Labs  Lab 10/16/17 0450  TROPONINI 0.05*   CBG: Recent Labs  Lab 10/15/17 1547  GLUCAP 93   Iron Studies: No results for input(s): IRON, TIBC, TRANSFERRIN, FERRITIN in the last 72 hours. Studies/Results: Ct Head Wo Contrast  Result Date: 10/15/2017 CLINICAL DATA:  71 y/o  M; altered mental status. EXAM: CT HEAD WITHOUT CONTRAST TECHNIQUE: Contiguous axial images were obtained from the base of the skull through the vertex without intravenous contrast. COMPARISON:  12/19/2016 CT head. FINDINGS: Brain: No evidence of acute infarction, hemorrhage, hydrocephalus, extra-axial collection or mass lesion/mass effect. Right parietal approach ventriculostomy catheter is stable in position with tip in the right lateral ventricle. Stable advanced chronic microvascular ischemic changes of the brain and parenchymal volume loss. Right anterior basal ganglia and right thalamus chronic lacunar infarcts. Right frontal region and right middle cranial fossa stable 6 mm dural  calcifications, probably small meningiomas. Vascular: Calcific atherosclerosis of carotid siphons. No hyperdense vessel. Skull: Right parietal region burr hole postsurgical changes are unchanged in chronic. No acute finding. Sinuses/Orbits: Mild mucosal thickening within the maxillary sinuses bilaterally. Other: None. IMPRESSION: 1. No acute intracranial abnormality identified. 2. Stable advanced chronic microvascular ischemic changes and parenchymal volume loss of the brain. 3. Stable right parietal approach ventriculostomy catheter. No hydrocephalus. Electronically Signed   By: Kristine Garbe M.D.   On: 10/15/2017 21:23   Dg Chest Portable 1 View  Result Date: 10/15/2017 CLINICAL DATA:  Chest pain and cough EXAM: PORTABLE CHEST 1 VIEW COMPARISON:  Chest radiograph 12/16/2016 FINDINGS: Cardiomegaly and calcific aortic atherosclerosis. Shunt catheter tubing traverses the right chest, unchanged. Small bilateral pleural effusions mild pulmonary vascular congestion. No focal consolidation. No pneumothorax. IMPRESSION: Cardiomegaly and calcific aortic atherosclerosis (ICD10-I70.0). Mild pulmonary vascular congestion without overt edema. Electronically Signed   By: Ulyses Jarred M.D.   On: 10/15/2017 18:34    Dialysis Orders:  [facility] MWF 4h 180F 400/A1.5x EDW 64.5 kg 2K/2.25 Ca Profile 4 Na Linear L AVF No heparin Hectorol 21mcg IV tiw Venofer 100mg  IV x10 Mircera 225mg  IV q 2 wks (last 10/24)  OP Labs: Hgb 10.4 Tsat 22% P 6.0 CA 9.9 PTH 305   Assessment/Plan: 1.  Acute encephalopathy - with dementia at baseline. Increased confusion with HD 11/5 Head CT without acute findings, blood cultures pending - per primary  2.  ESRD -  MWF.  No urgent HD needs today. Continue on schedule. Next HD 11/7. 4K bath No heparin  3.  Hypertension/volume  - BP elevated with vasc congestion on CXR - Not to EDW yet, UF to EDW as tolerated  4.  Anemia  - Hgb 10.6, Follow trend on ESA as OP  5.  Metabolic bone  disease -  Ca ok/Continue VDRA/binders  6.  Nutrition - Renal diet/vitamins 7. Blindness   Lynnda Child PA-C [facility] Pager [phone] 10/16/2017, 10:17 AM

## 2017-10-17 LAB — BLOOD CULTURE ID PANEL (REFLEXED)
ACINETOBACTER BAUMANNII: NOT DETECTED
CANDIDA ALBICANS: NOT DETECTED
CANDIDA GLABRATA: NOT DETECTED
CANDIDA KRUSEI: NOT DETECTED
CANDIDA PARAPSILOSIS: NOT DETECTED
CANDIDA TROPICALIS: NOT DETECTED
ENTEROBACTERIACEAE SPECIES: NOT DETECTED
Enterobacter cloacae complex: NOT DETECTED
Enterococcus species: NOT DETECTED
Escherichia coli: NOT DETECTED
Haemophilus influenzae: NOT DETECTED
KLEBSIELLA PNEUMONIAE: NOT DETECTED
Klebsiella oxytoca: NOT DETECTED
Listeria monocytogenes: NOT DETECTED
Methicillin resistance: NOT DETECTED
Neisseria meningitidis: NOT DETECTED
PSEUDOMONAS AERUGINOSA: NOT DETECTED
Proteus species: NOT DETECTED
STREPTOCOCCUS PNEUMONIAE: NOT DETECTED
STREPTOCOCCUS PYOGENES: NOT DETECTED
Serratia marcescens: NOT DETECTED
Staphylococcus aureus (BCID): NOT DETECTED
Staphylococcus species: DETECTED — AB
Streptococcus agalactiae: NOT DETECTED
Streptococcus species: NOT DETECTED

## 2017-10-18 LAB — CULTURE, BLOOD (ROUTINE X 2): SPECIAL REQUESTS: ADEQUATE

## 2017-10-21 LAB — CULTURE, BLOOD (ROUTINE X 2)
Culture: NO GROWTH
Special Requests: ADEQUATE

## 2017-10-26 ENCOUNTER — Encounter (HOSPITAL_COMMUNITY): Payer: Self-pay

## 2017-10-26 ENCOUNTER — Emergency Department (HOSPITAL_COMMUNITY): Payer: Medicare Other

## 2017-10-26 ENCOUNTER — Emergency Department (HOSPITAL_COMMUNITY)
Admission: EM | Admit: 2017-10-26 | Discharge: 2017-10-26 | Disposition: A | Discharge status: Home / Self Care | Payer: Medicare Other | Attending: Emergency Medicine | Admitting: Emergency Medicine

## 2017-10-26 ENCOUNTER — Other Ambulatory Visit: Payer: Self-pay

## 2017-10-26 DIAGNOSIS — Z5321 Procedure and treatment not carried out due to patient leaving prior to being seen by health care provider: Secondary | ICD-10-CM | POA: Diagnosis not present

## 2017-10-26 DIAGNOSIS — R5383 Other fatigue: Secondary | ICD-10-CM | POA: Insufficient documentation

## 2017-10-26 LAB — BASIC METABOLIC PANEL
Anion gap: 8 (ref 5–15)
CHLORIDE: 99 mmol/L — AB (ref 101–111)
CO2: 33 mmol/L — AB (ref 22–32)
CREATININE: 3.51 mg/dL — AB (ref 0.61–1.24)
Calcium: 9.3 mg/dL (ref 8.9–10.3)
GFR calc Af Amer: 19 mL/min — ABNORMAL LOW (ref >60)
GFR, EST NON AFRICAN AMERICAN: 16 mL/min — AB (ref >60)
GLUCOSE: 94 mg/dL (ref 65–99)
Potassium: 3.1 mmol/L — ABNORMAL LOW (ref 3.5–5.1)
Sodium: 140 mmol/L (ref 135–145)

## 2017-10-26 LAB — CBC
HEMATOCRIT: 32.3 % — AB (ref 39.0–52.0)
HEMOGLOBIN: 9.6 g/dL — AB (ref 13.0–17.0)
MCH: 28.8 pg (ref 26.0–34.0)
MCHC: 29.7 g/dL — AB (ref 30.0–36.0)
MCV: 97 fL (ref 78.0–100.0)
Platelets: 70 10*3/uL — ABNORMAL LOW (ref 150–400)
RBC: 3.33 MIL/uL — ABNORMAL LOW (ref 4.22–5.81)
RDW: 17.5 % — ABNORMAL HIGH (ref 11.5–15.5)
WBC: 3.4 10*3/uL — AB (ref 4.0–10.5)

## 2017-10-26 LAB — CBG MONITORING, ED
GLUCOSE-CAPILLARY: 115 mg/dL — AB (ref 65–99)
Glucose-Capillary: 87 mg/dL (ref 65–99)

## 2017-10-26 LAB — I-STAT CG4 LACTIC ACID, ED: LACTIC ACID, VENOUS: 1.41 mmol/L (ref 0.5–1.9)

## 2017-10-26 NOTE — ED Triage Notes (Signed)
[facility]- pt coming from home with complaint of generalized malaise.Pt is a MWF dialysis pt. He wanted to come to the hospital for feeling generally unwell X1 week. Pt alert oriented. 18g RAC.  197/80, 66bpm, 96%RA, RR16

## 2017-10-26 NOTE — ED Triage Notes (Signed)
Pt states he has been at dialysis all day and is very hungry. EMS reported a blood sugar of 76. Pt AOX4, allowed pt to have a [place] sandwich and ginger ale.

## 2017-11-05 ENCOUNTER — Inpatient Hospital Stay (HOSPITAL_COMMUNITY)
Admission: EM | Admit: 2017-11-05 | Discharge: 2017-12-11 | DRG: 870 | Disposition: E | Payer: Medicare Other | Attending: Internal Medicine | Admitting: Internal Medicine

## 2017-11-05 ENCOUNTER — Emergency Department (HOSPITAL_COMMUNITY): Payer: Medicare Other

## 2017-11-05 ENCOUNTER — Encounter (HOSPITAL_COMMUNITY): Payer: Self-pay

## 2017-11-05 ENCOUNTER — Other Ambulatory Visit: Payer: Self-pay

## 2017-11-05 DIAGNOSIS — J440 Chronic obstructive pulmonary disease with acute lower respiratory infection: Secondary | ICD-10-CM | POA: Diagnosis present

## 2017-11-05 DIAGNOSIS — F0390 Unspecified dementia without behavioral disturbance: Secondary | ICD-10-CM | POA: Diagnosis present

## 2017-11-05 DIAGNOSIS — J9600 Acute respiratory failure, unspecified whether with hypoxia or hypercapnia: Secondary | ICD-10-CM

## 2017-11-05 DIAGNOSIS — Z4659 Encounter for fitting and adjustment of other gastrointestinal appliance and device: Secondary | ICD-10-CM

## 2017-11-05 DIAGNOSIS — I5033 Acute on chronic diastolic (congestive) heart failure: Secondary | ICD-10-CM | POA: Diagnosis present

## 2017-11-05 DIAGNOSIS — G9340 Encephalopathy, unspecified: Secondary | ICD-10-CM | POA: Diagnosis present

## 2017-11-05 DIAGNOSIS — I132 Hypertensive heart and chronic kidney disease with heart failure and with stage 5 chronic kidney disease, or end stage renal disease: Secondary | ICD-10-CM | POA: Diagnosis present

## 2017-11-05 DIAGNOSIS — H3552 Pigmentary retinal dystrophy: Secondary | ICD-10-CM | POA: Diagnosis present

## 2017-11-05 DIAGNOSIS — I313 Pericardial effusion (noninflammatory): Secondary | ICD-10-CM | POA: Diagnosis present

## 2017-11-05 DIAGNOSIS — A419 Sepsis, unspecified organism: Secondary | ICD-10-CM | POA: Diagnosis present

## 2017-11-05 DIAGNOSIS — F17210 Nicotine dependence, cigarettes, uncomplicated: Secondary | ICD-10-CM | POA: Diagnosis present

## 2017-11-05 DIAGNOSIS — K219 Gastro-esophageal reflux disease without esophagitis: Secondary | ICD-10-CM | POA: Diagnosis present

## 2017-11-05 DIAGNOSIS — Z8371 Family history of colonic polyps: Secondary | ICD-10-CM

## 2017-11-05 DIAGNOSIS — Z79899 Other long term (current) drug therapy: Secondary | ICD-10-CM

## 2017-11-05 DIAGNOSIS — J9601 Acute respiratory failure with hypoxia: Secondary | ICD-10-CM | POA: Diagnosis not present

## 2017-11-05 DIAGNOSIS — R1110 Vomiting, unspecified: Secondary | ICD-10-CM

## 2017-11-05 DIAGNOSIS — G9341 Metabolic encephalopathy: Secondary | ICD-10-CM | POA: Diagnosis present

## 2017-11-05 DIAGNOSIS — Z992 Dependence on renal dialysis: Secondary | ICD-10-CM

## 2017-11-05 DIAGNOSIS — J189 Pneumonia, unspecified organism: Secondary | ICD-10-CM | POA: Diagnosis present

## 2017-11-05 DIAGNOSIS — Z9841 Cataract extraction status, right eye: Secondary | ICD-10-CM

## 2017-11-05 DIAGNOSIS — Z8673 Personal history of transient ischemic attack (TIA), and cerebral infarction without residual deficits: Secondary | ICD-10-CM

## 2017-11-05 DIAGNOSIS — I2510 Atherosclerotic heart disease of native coronary artery without angina pectoris: Secondary | ICD-10-CM | POA: Diagnosis present

## 2017-11-05 DIAGNOSIS — N186 End stage renal disease: Secondary | ICD-10-CM

## 2017-11-05 DIAGNOSIS — E876 Hypokalemia: Secondary | ICD-10-CM | POA: Diagnosis not present

## 2017-11-05 DIAGNOSIS — H547 Unspecified visual loss: Secondary | ICD-10-CM | POA: Diagnosis present

## 2017-11-05 DIAGNOSIS — Z66 Do not resuscitate: Secondary | ICD-10-CM | POA: Diagnosis not present

## 2017-11-05 DIAGNOSIS — E8770 Fluid overload, unspecified: Secondary | ICD-10-CM

## 2017-11-05 DIAGNOSIS — Z9289 Personal history of other medical treatment: Secondary | ICD-10-CM

## 2017-11-05 DIAGNOSIS — K7200 Acute and subacute hepatic failure without coma: Secondary | ICD-10-CM | POA: Diagnosis present

## 2017-11-05 DIAGNOSIS — Z86010 Personal history of colonic polyps: Secondary | ICD-10-CM

## 2017-11-05 DIAGNOSIS — J90 Pleural effusion, not elsewhere classified: Secondary | ICD-10-CM | POA: Diagnosis present

## 2017-11-05 DIAGNOSIS — I10 Essential (primary) hypertension: Secondary | ICD-10-CM | POA: Diagnosis present

## 2017-11-05 DIAGNOSIS — R509 Fever, unspecified: Secondary | ICD-10-CM | POA: Diagnosis not present

## 2017-11-05 DIAGNOSIS — D631 Anemia in chronic kidney disease: Secondary | ICD-10-CM | POA: Diagnosis present

## 2017-11-05 DIAGNOSIS — Z888 Allergy status to other drugs, medicaments and biological substances status: Secondary | ICD-10-CM

## 2017-11-05 DIAGNOSIS — I480 Paroxysmal atrial fibrillation: Secondary | ICD-10-CM | POA: Diagnosis present

## 2017-11-05 DIAGNOSIS — M1990 Unspecified osteoarthritis, unspecified site: Secondary | ICD-10-CM | POA: Diagnosis present

## 2017-11-05 DIAGNOSIS — G931 Anoxic brain damage, not elsewhere classified: Secondary | ICD-10-CM

## 2017-11-05 DIAGNOSIS — Y95 Nosocomial condition: Secondary | ICD-10-CM | POA: Diagnosis present

## 2017-11-05 DIAGNOSIS — Z961 Presence of intraocular lens: Secondary | ICD-10-CM

## 2017-11-05 DIAGNOSIS — Z515 Encounter for palliative care: Secondary | ICD-10-CM

## 2017-11-05 DIAGNOSIS — Z789 Other specified health status: Secondary | ICD-10-CM

## 2017-11-05 DIAGNOSIS — Z9842 Cataract extraction status, left eye: Secondary | ICD-10-CM

## 2017-11-05 DIAGNOSIS — I469 Cardiac arrest, cause unspecified: Secondary | ICD-10-CM | POA: Diagnosis present

## 2017-11-05 DIAGNOSIS — I361 Nonrheumatic tricuspid (valve) insufficiency: Secondary | ICD-10-CM | POA: Diagnosis not present

## 2017-11-05 DIAGNOSIS — M109 Gout, unspecified: Secondary | ICD-10-CM | POA: Diagnosis present

## 2017-11-05 DIAGNOSIS — Z89022 Acquired absence of left finger(s): Secondary | ICD-10-CM

## 2017-11-05 DIAGNOSIS — N400 Enlarged prostate without lower urinary tract symptoms: Secondary | ICD-10-CM | POA: Diagnosis present

## 2017-11-05 DIAGNOSIS — Z800 Family history of malignant neoplasm of digestive organs: Secondary | ICD-10-CM

## 2017-11-05 DIAGNOSIS — D696 Thrombocytopenia, unspecified: Secondary | ICD-10-CM | POA: Diagnosis present

## 2017-11-05 DIAGNOSIS — Z7189 Other specified counseling: Secondary | ICD-10-CM | POA: Diagnosis not present

## 2017-11-05 DIAGNOSIS — Z8041 Family history of malignant neoplasm of ovary: Secondary | ICD-10-CM

## 2017-11-05 LAB — COMPREHENSIVE METABOLIC PANEL
ALBUMIN: 3.4 g/dL — AB (ref 3.5–5.0)
ALK PHOS: 59 U/L (ref 38–126)
ALT: 10 U/L — ABNORMAL LOW (ref 17–63)
ANION GAP: 11 (ref 5–15)
AST: 20 U/L (ref 15–41)
BILIRUBIN TOTAL: 1.1 mg/dL (ref 0.3–1.2)
BUN: 31 mg/dL — ABNORMAL HIGH (ref 6–20)
CALCIUM: 10.4 mg/dL — AB (ref 8.9–10.3)
CO2: 31 mmol/L (ref 22–32)
Chloride: 103 mmol/L (ref 101–111)
Creatinine, Ser: 10.58 mg/dL — ABNORMAL HIGH (ref 0.61–1.24)
GFR calc non Af Amer: 4 mL/min — ABNORMAL LOW (ref >60)
GFR, EST AFRICAN AMERICAN: 5 mL/min — AB (ref >60)
Glucose, Bld: 94 mg/dL (ref 65–99)
POTASSIUM: 3.8 mmol/L (ref 3.5–5.1)
Sodium: 145 mmol/L (ref 135–145)
TOTAL PROTEIN: 7.8 g/dL (ref 6.5–8.1)

## 2017-11-05 LAB — CBG MONITORING, ED
GLUCOSE-CAPILLARY: 76 mg/dL (ref 65–99)
GLUCOSE-CAPILLARY: 94 mg/dL (ref 65–99)

## 2017-11-05 LAB — CBC
HEMATOCRIT: 31.9 % — AB (ref 39.0–52.0)
HEMOGLOBIN: 9.1 g/dL — AB (ref 13.0–17.0)
MCH: 28.2 pg (ref 26.0–34.0)
MCHC: 28.5 g/dL — ABNORMAL LOW (ref 30.0–36.0)
MCV: 98.8 fL (ref 78.0–100.0)
Platelets: 74 10*3/uL — ABNORMAL LOW (ref 150–400)
RBC: 3.23 MIL/uL — AB (ref 4.22–5.81)
RDW: 19.1 % — ABNORMAL HIGH (ref 11.5–15.5)
WBC: 5.1 10*3/uL (ref 4.0–10.5)

## 2017-11-05 LAB — I-STAT CG4 LACTIC ACID, ED: LACTIC ACID, VENOUS: 2.8 mmol/L — AB (ref 0.5–1.9)

## 2017-11-05 MED ORDER — DEXTROSE 5 % IV SOLN
2.0000 g | Freq: Once | INTRAVENOUS | Status: AC
  Administered 2017-11-06: 2 g via INTRAVENOUS
  Filled 2017-11-05: qty 2

## 2017-11-05 MED ORDER — LABETALOL HCL 5 MG/ML IV SOLN: 5.0000 mg | Freq: Once | INTRAVENOUS | Status: DC

## 2017-11-05 MED ORDER — KETAMINE HCL 10 MG/ML IJ SOLN
INTRAMUSCULAR | Status: AC | PRN
  Administered 2017-11-05: 59 mg via INTRAVENOUS

## 2017-11-05 MED ORDER — LABETALOL HCL 5 MG/ML IV SOLN: 20.0000 mg | Freq: Once | INTRAVENOUS | Status: DC

## 2017-11-05 MED ORDER — KETAMINE HCL-SODIUM CHLORIDE 100-0.9 MG/10ML-% IV SOSY
1.0000 mg/kg | PREFILLED_SYRINGE | Freq: Once | INTRAVENOUS | Status: DC
  Filled 2017-11-05: qty 10

## 2017-11-05 MED ORDER — ACETAMINOPHEN 650 MG RE SUPP: 650.0000 mg | Freq: Four times a day (QID) | RECTAL | Status: DC | PRN

## 2017-11-05 MED ORDER — KETAMINE HCL 10 MG/ML IJ SOLN
INTRAMUSCULAR | Status: AC | PRN
  Administered 2017-11-05 (×2): 59 mg via INTRAVENOUS

## 2017-11-05 MED ORDER — LORAZEPAM 2 MG/ML IJ SOLN
1.0000 mg | Freq: Once | INTRAMUSCULAR | Status: AC
  Administered 2017-11-05: 1 mg via INTRAMUSCULAR
  Filled 2017-11-05: qty 1

## 2017-11-05 MED ORDER — LIDOCAINE HCL (PF) 1 % IJ SOLN
5.0000 mL | Freq: Once | INTRAMUSCULAR | Status: AC
  Administered 2017-11-05: 5 mL via INTRADERMAL
  Filled 2017-11-05: qty 5

## 2017-11-05 MED ORDER — ACETAMINOPHEN 325 MG PO TABS: 650.0000 mg | ORAL_TABLET | ORAL | Status: DC | PRN

## 2017-11-05 MED ORDER — DEXTROSE 5 % IV SOLN
500.0000 mg | Freq: Once | INTRAVENOUS | Status: AC
  Administered 2017-11-06: 500 mg via INTRAVENOUS
  Filled 2017-11-05: qty 500

## 2017-11-05 NOTE — ED Notes (Signed)
Patient transported to X-ray 

## 2017-11-05 NOTE — ED Notes (Signed)
I Stat Lactic Acid results shown to Dr. Nathan Mali.

## 2017-11-05 NOTE — ED Notes (Signed)
Attempted IV insertion. Patient combative. Tore IV out when placed in L AC.

## 2017-11-05 NOTE — ED Notes (Signed)
Pt placed on 3L of oxygen

## 2017-11-05 NOTE — ED Provider Notes (Signed)
[facility] EMERGENCY DEPARTMENT Provider Note   CSN: [id number] Arrival date & time: 11/02/2017  1147     History   Chief Complaint Chief Complaint  Patient presents with  . Weakness    HPI Cameron Mendez is a 71 y.o. male.  HPI 16yoM with hx of ESRD on HD MWF, CHF, COPD, prior CVA, and dementia presenting with 1 week of gradually worsening altered mental status.  He is also had 3 days of worsening cough with no sputum production.  Wife states that he has not been talking as much as normal and not wanting to eat.  No documented fevers at home.  Patient has not complained of headache, neck pain, chest pain, abdominal pain.  He has not had any vomiting or diarrhea.  He typically walks without most assistance, however for the past week he has had more difficulty ambulating and is now requiring much assistance. No recent fall/trauma. He was supposed to have dialysis today however they did not do dialysis due to his confusion told him to present here. Pt unable to answer questions at this time. Wife is a poor historian and unable to provide further details of his decline.  Past Medical History:  Diagnosis Date  . Anemia   . Arthritis    Gout  . Blind   . BPH (benign prostatic hyperplasia)   . Cardiac arrest ([place])   . CHF (congestive heart failure) ([place])   . CKD (chronic kidney disease), stage IV ([place])    a. L upper extremity AV fistula created 07/2012.  . Colon polyps   . COPD (chronic obstructive pulmonary disease) ([place])   . Diastolic dysfunction   . Gangrene of finger ([place])    a. 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  . GI (gastrointestinal bleed) feb 2016  . Hypertension    dx--"long time"  . Irregular heart rate 03/03/2015  . Pericardial effusion    a. Noted on echo 10/2009. b. Again seen on echo 09/2013.  [name] Retinitis pigmentosa    Blindness--has shunt --placed yrs ago in [place]..  . Stroke (cerebrum) ([place])    12/19/16 CT scan revealed old left  lacunar infarct    Patient Active Problem List   Diagnosis Date Noted  . Acute encephalopathy 10/15/2017  . Encephalopathy   . GI bleed 12/15/2016  . Hypokalemia 12/15/2016  . Gastrointestinal hemorrhage   . Symptomatic anemia 11/18/2015  . Acute coronary syndrome ([place]) 09/28/2015  . HCAP (healthcare-associated pneumonia) 09/18/2015  . Cardiac arrest ([place]) 09/13/2015  . Paroxysmal atrial fibrillation ([place]) 03/03/2015  . Tobacco abuse 01/10/2015  . Pleural effusion 01/09/2015  . Chronic diastolic CHF (congestive heart failure) ([place]) 01/08/2015  . History of adenomatous polyp of colon 12/31/2014  . Pancytopenia ([place]) 08/21/2014  . COPD (chronic obstructive pulmonary disease) ([place]) 05/12/2014  . COPD exacerbation ([place]) 04/29/2014  . History of pericardiocentesis 04/27/2014  . Volume overload 04/27/2014  . Anemia 09/18/2013  . Thrombocytopenia ([place]) 09/18/2013  . ESRD on hemodialysis ([place]) 09/18/2013  . Essential hypertension, benign 10/04/2009  . BLINDNESS, [place], [place] DEFINITION 10/01/2009  . OSTEOARTHRITIS 10/01/2009  . Gout, unspecified 09/28/2009    Past Surgical History:  Procedure Laterality Date  . AMPUTATION  12/30/2012   Procedure: AMPUTATION DIGIT;  Surgeon: Tennis Must, MD;  Location: [facility];  Service: Orthopedics;  Laterality: Left;  LEFT SMALL FINGER AMPUTATION  . AV FISTULA PLACEMENT  07/15/2012   Procedure: ARTERIOVENOUS (AV) FISTULA CREATION;  Surgeon: Arvilla Meres  Early, MD;  Location: [facility] OR;  Service: Vascular;  Laterality: Left;  . CATARACT EXTRACTION W/ INTRAOCULAR LENS  IMPLANT, BILATERAL Bilateral   . KNEE LIGAMENT RECONSTRUCTION Left   . PERICARDIAL TAP N/A 09/19/2013   Procedure: PERICARDIAL TAP;  Surgeon: Blane Ohara, MD;  [facility];  Service: Cardiovascular;  Laterality: N/A;  . PERICARDIOCENTESIS  09/2013       Home Medications    Prior to Admission medications   Medication Sig Start Date End Date Taking?  Authorizing Provider  acetaminophen (TYLENOL) 500 MG tablet Take 1,000 mg by mouth every 4 (four) hours as needed for mild pain.   Yes [provider]  allopurinol (ZYLOPRIM) 100 MG tablet Take 100 mg by mouth daily.    Yes [provider]  amLODipine (NORVASC) 5 MG tablet Take 5 mg by mouth daily.   Yes [provider]  carvedilol (COREG) 12.5 MG tablet Take 1 tablet (12.5 mg total) by mouth 2 (two) times daily with a meal. 09/21/15  Yes Bonnielee Haff, MD  diclofenac sodium (VOLTAREN) 1 % GEL Apply topically to back or knees every day as needed for pain   Yes [provider]  diltiazem (CARDIZEM CD) 120 MG 24 hr capsule Take 1 capsule (120 mg total) by mouth daily. 10/01/15  Yes Dixie Dials, MD  doxercalciferol (HECTOROL) 4 MCG/2ML injection Inject 0.61ml into the vein every Monday, Wednesday and Friday with hemodialysis   Yes [provider]  feeding supplement (BOOST HIGH PROTEIN) LIQD Take 1 Container by mouth daily.   Yes [provider]  ferric gluconate 125 mg in sodium chloride 0.9 % 100 mL Inject 125mg  into the vein every Monday, Wednesday and Friday with hemodialysis   Yes [provider]  Amino Acids-Protein Hydrolys (FEEDING SUPPLEMENT, PRO-STAT SUGAR FREE 64,) LIQD Take 30 mLs by mouth 2 (two) times daily with a meal.    [provider]  pantoprazole (PROTONIX) 40 MG tablet Take 1 tablet (40 mg total) by mouth daily. Switch for any other PPI at similar dose and frequency Patient not taking: Reported on 10/15/2017 12/21/16   Dhungel, Flonnie Overman, MD  SPIRIVA RESPIMAT 2.5 MCG/ACT AERS INHALE 2 PUFFS INTO THE LUNGS EVERY MORNING Patient not taking: Reported on 10/15/2017 08/03/15   [name], MD    Family History Family History  Problem Relation Age of Onset  . Ovarian cancer Mother   . Colon polyps Brother   . Colon cancer Brother   . Kidney disease Neg Hx   . Gallbladder disease Neg Hx   . Esophageal cancer  Neg Hx   . Heart disease Neg Hx   . Stomach cancer Neg Hx     Social History Social History   Tobacco Use  . Smoking status: Current Every Day Smoker    Packs/day: 0.50    Years: 50.00    Pack years: 25.00    Types: Cigarettes  . Smokeless tobacco: Never Used  . Tobacco comment: Pt given handout on how to quit smoking 12/31/14  Substance Use Topics  . Alcohol use: No    Alcohol/week: 0.0 oz    Comment: "quit driinking in ~ 2000"  . Drug use: No     Allergies   Ibuprofen and Nsaids   Review of Systems Review of Systems  Unable to perform ROS: Mental status change     Physical Exam Updated Vital Signs BP (!) 205/60 (BP Location: Right Arm)   Pulse 90   Temp (!)  101.6 F (38.7 C) (Rectal)   Resp 15   Ht 5\' 3"  (1.6 m)   Wt 59 kg (130 lb)   SpO2 95%   BMI 23.03 kg/m   Physical Exam  Constitutional: He appears well-developed and well-nourished. He appears lethargic.  HENT:  Head: Normocephalic and atraumatic.  Eyes: Conjunctivae are normal. Pupils are equal, round, and reactive to light.  Neck: Neck supple. JVD present. No spinous process tenderness present. No neck rigidity.  Cardiovascular: Normal rate, regular rhythm, S1 normal, S2 normal, intact distal pulses and normal pulses.  Murmur heard.  Systolic murmur is present with a grade of 5/6. Pulmonary/Chest: No tachypnea. No respiratory distress. He has no decreased breath sounds. He has rales in the right lower field and the left lower field.  Abdominal: Soft. He exhibits no distension. There is no tenderness. There is no rigidity, no rebound and no guarding.  Musculoskeletal:  Fistula to LUE with no surrounding erythema or drainage.  Neurological: He appears lethargic. He is disoriented.  No obvious CN deficit although pt unable to participate in exam. Moving all extremities spontaneously.  Nursing note and vitals reviewed.    ED Treatments / Results  Labs (all labs ordered are listed, but only  abnormal results are displayed) Labs Reviewed  COMPREHENSIVE METABOLIC PANEL - Abnormal; Notable for the following components:      Result Value   BUN 31 (*)    Creatinine, Ser 10.58 (*)    Calcium 10.4 (*)    Albumin 3.4 (*)    ALT 10 (*)    GFR calc non Af Amer 4 (*)    GFR calc Af Amer 5 (*)    All other components within normal limits  CBC - Abnormal; Notable for the following components:   RBC 3.23 (*)    Hemoglobin 9.1 (*)    HCT 31.9 (*)    MCHC 28.5 (*)    RDW 19.1 (*)    Platelets 74 (*)    All other components within normal limits  URINALYSIS, ROUTINE W REFLEX MICROSCOPIC  CBG MONITORING, ED  CBG MONITORING, ED  I-STAT CG4 LACTIC ACID, ED    EKG  EKG Interpretation  Date/Time:  Monday November 05 2017 11:54:54 EST Ventricular Rate:  91 PR Interval:  130 QRS Duration: 88 QT Interval:  364 QTC Calculation: 447 R Axis:   42 Text Interpretation:  Normal sinus rhythm Marked ST abnormality, possible inferior subendocardial injury Abnormal ECG No significant change since last tracing Confirmed by Thomasene Lot, [place] [id number]) on 11/07/2017 12:00:14 PM       Radiology No results found.  Procedures .Lumbar Puncture Date/Time: 10/16/2017 11:48 PM Performed by: Nahun Kronberg Mal[place], MD Authorized by: Tanna Furry, MD   Consent:    Consent obtained:  Written   Consent given by:  Spouse   Risks discussed:  Bleeding, infection and pain   Alternatives discussed:  Observation Pre-procedure details:    Procedure purpose:  Diagnostic   Preparation: Patient was prepped and draped in usual sterile fashion   Sedation:    Sedation type:  Moderate (conscious) sedation Procedure details:    Lumbar space:  L3-L4 interspace   Patient position:  L lateral decubitus   Needle gauge:  20   Needle type:  Spinal needle - Quincke tip   Needle length (in):  3.5   Ultrasound guidance: no     Number of attempts:  5 or more Post-procedure:    Puncture site:  Direct pressure  applied  Patient tolerance of procedure:  Tolerated with difficulty Comments:     Unsuccessful.    (including critical care time)  Medications Ordered in ED Medications - No data to display   Initial Impression / Assessment and Plan / ED Course  I have reviewed the triage vital signs and the nursing notes.  Pertinent labs & imaging results that were available during my care of the patient were reviewed by me and considered in my medical decision making (see chart for details).     71 year old male with the above history presenting for AMS for 1 week and a cough for 3 days.  He does dialysis Monday Wednesday and Friday and went today to do dialysis and they did not complete dialysis as he was confused.  He is found to be febrile and hypertensive to the 132G systolic.  Not tachycardic.  Mild rales in the lower lobes.  Abdomen benign.  EKG with ST depressions in V5, V6, aVF, 2 and 3 and lead I that appear to be chronic.  Patient does not complain of chest pain.  He does not answer questions and is lethargic.  Blood cultures, CBC, CMP, lactic acid ordered.  Chest x-ray ordered for concern of pneumonia.  Patient does not make urine.  Labs notable for hemoglobin 9 which appears to be his baseline, elevated BUN and creatinine likely from ESRD and missing dialysis.  Lactic acid 2.8.  Chest x-ray with mild pulmonary edema but no obvious signs of pneumonia.  Fluid not given as the patient missed dialysis and is clinically fluid overloaded.  CT head ordered as the patient will likely need a lumbar puncture as he is altered.  Because his symptoms have been present for a week, low suspicion for bacterial meningitis but will need to rule out viral meningitis or encephalitis.  CT with no acute change and no signs of hydrocephalus. A lumbar puncture was attempted multiple times by myself and my attending with no success.  We will cover the patient with 2 g of Rocephin.  We will add azithromycin as he had a  cough and could have  Pneumonia.  Low suspicion for HSV encephalitis therefore discussed with the hospitalist and will defer to them for treatment.  Will admit to the stepdown unit for infection of unknown source and fluid overload.  Final Clinical Impressions(s) / ED Diagnoses   Final diagnoses:  None    ED Discharge Orders    None       Eluzer Howdeshell Mali, MD 11/06/17 Tilden Dome, MD 11/14/17 1501

## 2017-11-05 NOTE — ED Notes (Signed)
Pt is complete two assist. Assisted pt from wheelchair to bed.

## 2017-11-05 NOTE — ED Triage Notes (Signed)
Per Caregiver, Pt is coming from Dialysis where he was not able to get his dialysis completed. She states that he has not been eating well, he will not chew or swallow his food. Hx of Dementia, but reports it is worse in the last week. Baseline is alert to self about a week ago. Currently, pt is sleeping often.

## 2017-11-05 NOTE — ED Notes (Signed)
Patient transported to CT 

## 2017-11-05 NOTE — ED Notes (Signed)
Unable to get CT d/t pt fighting staff

## 2017-11-05 NOTE — ED Notes (Signed)
MD stated not to cath pt for urine at this time d/t previous notes of anuria

## 2017-11-05 NOTE — ED Notes (Signed)
Patient will not keep pulse ox on finger. Fingers cool to touch. Snatches away when attempt to warm hands. Tried on earlobe without success.

## 2017-11-06 ENCOUNTER — Inpatient Hospital Stay (HOSPITAL_COMMUNITY): Payer: Medicare Other

## 2017-11-06 DIAGNOSIS — J9601 Acute respiratory failure with hypoxia: Secondary | ICD-10-CM

## 2017-11-06 DIAGNOSIS — I469 Cardiac arrest, cause unspecified: Secondary | ICD-10-CM

## 2017-11-06 LAB — CBC
HCT: 31.1 % — ABNORMAL LOW (ref 39.0–52.0)
HEMATOCRIT: 36.3 % — AB (ref 39.0–52.0)
HEMOGLOBIN: 9 g/dL — AB (ref 13.0–17.0)
HEMOGLOBIN: 10.6 g/dL — AB (ref 13.0–17.0)
MCH: 29.1 pg (ref 26.0–34.0)
MCH: 29.2 pg (ref 26.0–34.0)
MCHC: 28.9 g/dL — ABNORMAL LOW (ref 30.0–36.0)
MCHC: 29.2 g/dL — AB (ref 30.0–36.0)
MCV: 100.6 fL — ABNORMAL HIGH (ref 78.0–100.0)
MCV: 100 fL (ref 78.0–100.0)
PLATELETS: 65 10*3/uL — AB (ref 150–400)
Platelets: 89 10*3/uL — ABNORMAL LOW (ref 150–400)
RBC: 3.09 MIL/uL — AB (ref 4.22–5.81)
RBC: 3.63 MIL/uL — ABNORMAL LOW (ref 4.22–5.81)
RDW: 19.5 % — ABNORMAL HIGH (ref 11.5–15.5)
RDW: 19.4 % — ABNORMAL HIGH (ref 11.5–15.5)
WBC: 4.1 10*3/uL (ref 4.0–10.5)
WBC: 11 10*3/uL — ABNORMAL HIGH (ref 4.0–10.5)

## 2017-11-06 LAB — BASIC METABOLIC PANEL
Anion gap: 27 — ABNORMAL HIGH (ref 5–15)
BUN: 12 mg/dL (ref 6–20)
CALCIUM: 9 mg/dL (ref 8.9–10.3)
CO2: 13 mmol/L — ABNORMAL LOW (ref 22–32)
Chloride: 101 mmol/L (ref 101–111)
Creatinine, Ser: 5.1 mg/dL — ABNORMAL HIGH (ref 0.61–1.24)
GFR calc Af Amer: 12 mL/min — ABNORMAL LOW (ref >60)
GFR, EST NON AFRICAN AMERICAN: 10 mL/min — AB (ref >60)
GLUCOSE: 74 mg/dL (ref 65–99)
Potassium: 4.2 mmol/L (ref 3.5–5.1)
Sodium: 141 mmol/L (ref 135–145)

## 2017-11-06 LAB — POCT I-STAT 3, ART BLOOD GAS (G3+)
Acid-base deficit: 13 mmol/L — ABNORMAL HIGH (ref 0.0–2.0)
Bicarbonate: 13.6 mmol/L — ABNORMAL LOW (ref 20.0–28.0)
O2 SAT: 100 %
PCO2 ART: 31.8 mmHg — AB (ref 32.0–48.0)
PH ART: 7.238 — AB (ref 7.350–7.450)
PO2 ART: 371 mmHg — AB (ref 83.0–108.0)
TCO2: 15 mmol/L — AB (ref 22–32)

## 2017-11-06 LAB — COMPREHENSIVE METABOLIC PANEL
ALK PHOS: 54 U/L (ref 38–126)
ALK PHOS: 75 U/L (ref 38–126)
ALT: 8 U/L — AB (ref 17–63)
ALT: 411 U/L — AB (ref 17–63)
ANION GAP: 11 (ref 5–15)
AST: 18 U/L (ref 15–41)
AST: 719 U/L — AB (ref 15–41)
Albumin: 3 g/dL — ABNORMAL LOW (ref 3.5–5.0)
Albumin: 3.3 g/dL — ABNORMAL LOW (ref 3.5–5.0)
Anion gap: 24 — ABNORMAL HIGH (ref 5–15)
BILIRUBIN TOTAL: 0.6 mg/dL (ref 0.3–1.2)
BUN: 37 mg/dL — ABNORMAL HIGH (ref 6–20)
BUN: 22 mg/dL — AB (ref 6–20)
CALCIUM: 9.9 mg/dL (ref 8.9–10.3)
CALCIUM: 8.7 mg/dL — AB (ref 8.9–10.3)
CHLORIDE: 98 mmol/L — AB (ref 101–111)
CO2: 33 mmol/L — AB (ref 22–32)
CO2: 18 mmol/L — AB (ref 22–32)
CREATININE: 11.71 mg/dL — AB (ref 0.61–1.24)
CREATININE: 8.1 mg/dL — AB (ref 0.61–1.24)
Chloride: 103 mmol/L (ref 101–111)
GFR calc Af Amer: 7 mL/min — ABNORMAL LOW (ref >60)
GFR calc non Af Amer: 6 mL/min — ABNORMAL LOW (ref >60)
GFR, EST AFRICAN AMERICAN: 4 mL/min — AB (ref >60)
GFR, EST NON AFRICAN AMERICAN: 4 mL/min — AB (ref >60)
Glucose, Bld: 108 mg/dL — ABNORMAL HIGH (ref 65–99)
Glucose, Bld: 109 mg/dL — ABNORMAL HIGH (ref 65–99)
Potassium: 4 mmol/L (ref 3.5–5.1)
Potassium: 4.5 mmol/L (ref 3.5–5.1)
SODIUM: 147 mmol/L — AB (ref 135–145)
SODIUM: 140 mmol/L (ref 135–145)
TOTAL PROTEIN: 7 g/dL (ref 6.5–8.1)
Total Bilirubin: 1.3 mg/dL — ABNORMAL HIGH (ref 0.3–1.2)
Total Protein: 8 g/dL (ref 6.5–8.1)

## 2017-11-06 LAB — LACTIC ACID, PLASMA
Lactic Acid, Venous: 1.4 mmol/L (ref 0.5–1.9)
Lactic Acid, Venous: 1.2 mmol/L (ref 0.5–1.9)
Lactic Acid, Venous: 20.4 mmol/L (ref 0.5–1.9)
Lactic Acid, Venous: 12.1 mmol/L (ref 0.5–1.9)

## 2017-11-06 LAB — CSF CELL COUNT WITH DIFFERENTIAL
RBC Count, CSF: 1 /mm3 — ABNORMAL HIGH
TUBE #: 3
WBC, CSF: 4 /mm3 (ref 0–5)

## 2017-11-06 LAB — PHOSPHORUS: Phosphorus: 7.9 mg/dL — ABNORMAL HIGH (ref 2.5–4.6)

## 2017-11-06 LAB — PROCALCITONIN: PROCALCITONIN: 6.32 ng/mL

## 2017-11-06 LAB — MAGNESIUM
Magnesium: 3.2 mg/dL — ABNORMAL HIGH (ref 1.7–2.4)
Magnesium: 3 mg/dL — ABNORMAL HIGH (ref 1.7–2.4)

## 2017-11-06 LAB — VITAMIN B12: VITAMIN B 12: 663 pg/mL (ref 180–914)

## 2017-11-06 LAB — GLUCOSE, CAPILLARY
GLUCOSE-CAPILLARY: 124 mg/dL — AB (ref 65–99)
GLUCOSE-CAPILLARY: 25 mg/dL — AB (ref 65–99)
Glucose-Capillary: 58 mg/dL — ABNORMAL LOW (ref 65–99)
Glucose-Capillary: 80 mg/dL (ref 65–99)

## 2017-11-06 LAB — PROTEIN, CSF: TOTAL PROTEIN, CSF: 276 mg/dL — AB (ref 15–45)

## 2017-11-06 LAB — SEDIMENTATION RATE: SED RATE: 53 mm/h — AB (ref 0–16)

## 2017-11-06 LAB — HIV ANTIBODY (ROUTINE TESTING W REFLEX): HIV Screen 4th Generation wRfx: NONREACTIVE

## 2017-11-06 LAB — RPR: RPR Ser Ql: NONREACTIVE

## 2017-11-06 LAB — MRSA PCR SCREENING: MRSA by PCR: NEGATIVE

## 2017-11-06 LAB — GLUCOSE, CSF: GLUCOSE CSF: 59 mg/dL (ref 40–70)

## 2017-11-06 LAB — AMMONIA: Ammonia: 22 umol/L (ref 9–35)

## 2017-11-06 LAB — TROPONIN I: TROPONIN I: 0.11 ng/mL — AB (ref <0.03)

## 2017-11-06 LAB — TSH: TSH: 1.022 u[IU]/mL (ref 0.350–4.500)

## 2017-11-06 MED ORDER — HYDRALAZINE HCL 20 MG/ML IJ SOLN: 10.0000 mg | Freq: Four times a day (QID) | INTRAMUSCULAR | Status: DC | PRN

## 2017-11-06 MED ORDER — LIDOCAINE HCL (PF) 1 % IJ SOLN
5.0000 mL | Freq: Once | INTRAMUSCULAR | Status: AC
  Administered 2017-11-06: 5 mL via INTRADERMAL

## 2017-11-06 MED ORDER — AMLODIPINE BESYLATE 5 MG PO TABS: 5.0000 mg | ORAL_TABLET | Freq: Every day | ORAL | Status: DC

## 2017-11-06 MED ORDER — MIDAZOLAM HCL 2 MG/2ML IJ SOLN: 1.0000 mg | INTRAMUSCULAR | Status: DC | PRN

## 2017-11-06 MED ORDER — FENTANYL CITRATE (PF) 100 MCG/2ML IJ SOLN: 50.0000 ug | INTRAMUSCULAR | Status: DC | PRN

## 2017-11-06 MED ORDER — PRO-STAT SUGAR FREE PO LIQD
30.0000 mL | Freq: Two times a day (BID) | ORAL | Status: DC
  Administered 2017-11-07: 30 mL via ORAL
  Filled 2017-11-06 (×4): qty 30

## 2017-11-06 MED ORDER — LORAZEPAM 2 MG/ML IJ SOLN
INTRAMUSCULAR | Status: AC
  Filled 2017-11-06: qty 1

## 2017-11-06 MED ORDER — SODIUM CHLORIDE 0.9% FLUSH: 3.0000 mL | INTRAVENOUS | Status: DC | PRN

## 2017-11-06 MED ORDER — DILTIAZEM HCL ER COATED BEADS 120 MG PO CP24
120.0000 mg | ORAL_CAPSULE | Freq: Every day | ORAL | Status: DC
  Filled 2017-11-06: qty 1

## 2017-11-06 MED ORDER — SODIUM CHLORIDE 0.9 % IV SOLN
250.0000 mL | INTRAVENOUS | Status: DC | PRN
  Administered 2017-11-09 – 2017-11-12 (×2): 250 mL via INTRAVENOUS
  Administered 2017-11-13: 10 mL via INTRAVENOUS

## 2017-11-06 MED ORDER — CARVEDILOL 12.5 MG PO TABS
12.5000 mg | ORAL_TABLET | Freq: Two times a day (BID) | ORAL | Status: DC
  Filled 2017-11-06: qty 1

## 2017-11-06 MED ORDER — ALBUTEROL SULFATE (2.5 MG/3ML) 0.083% IN NEBU
5.0000 mg | INHALATION_SOLUTION | Freq: Once | RESPIRATORY_TRACT | Status: AC
  Administered 2017-11-06: 5 mg via RESPIRATORY_TRACT
  Filled 2017-11-06 (×2): qty 6

## 2017-11-06 MED ORDER — NOREPINEPHRINE BITARTRATE 1 MG/ML IV SOLN
0.0000 ug/min | INTRAVENOUS | Status: DC
  Administered 2017-11-06: 2 ug/min via INTRAVENOUS
  Filled 2017-11-06: qty 4

## 2017-11-06 MED ORDER — ALLOPURINOL 100 MG PO TABS
100.0000 mg | ORAL_TABLET | Freq: Every day | ORAL | Status: DC
Start: 2017-11-06 — End: 2017-11-06
  Filled 2017-11-06: qty 1

## 2017-11-06 MED ORDER — HEPARIN SODIUM (PORCINE) 5000 UNIT/ML IJ SOLN
5000.0000 [IU] | Freq: Three times a day (TID) | INTRAMUSCULAR | Status: DC
  Administered 2017-11-06 – 2017-11-14 (×24): 5000 [IU] via SUBCUTANEOUS
  Filled 2017-11-06 (×24): qty 1

## 2017-11-06 MED ORDER — ORAL CARE MOUTH RINSE
15.0000 mL | Freq: Four times a day (QID) | OROMUCOSAL | Status: DC
  Administered 2017-11-06 – 2017-11-17 (×42): 15 mL via OROMUCOSAL

## 2017-11-06 MED ORDER — TIOTROPIUM BROMIDE MONOHYDRATE 18 MCG IN CAPS
18.0000 ug | ORAL_CAPSULE | Freq: Every day | RESPIRATORY_TRACT | Status: DC
  Filled 2017-11-06: qty 5

## 2017-11-06 MED ORDER — PANTOPRAZOLE SODIUM 40 MG IV SOLR
40.0000 mg | INTRAVENOUS | Status: DC
  Administered 2017-11-06: 40 mg via INTRAVENOUS
  Filled 2017-11-06: qty 40

## 2017-11-06 MED ORDER — ACETAMINOPHEN 325 MG PO TABS: 650.0000 mg | ORAL_TABLET | Freq: Four times a day (QID) | ORAL | Status: DC | PRN

## 2017-11-06 MED ORDER — SODIUM CHLORIDE 0.9 % IV SOLN
125.0000 mg | INTRAVENOUS | Status: DC
  Administered 2017-11-09: 125 mg via INTRAVENOUS
  Filled 2017-11-06 (×6): qty 10

## 2017-11-06 MED ORDER — DEXTROSE 50 % IV SOLN
INTRAVENOUS | Status: AC
  Administered 2017-11-06: 25 mL
  Filled 2017-11-06: qty 50

## 2017-11-06 MED ORDER — DEXTROSE 5 % IV SOLN
2.0000 g | INTRAVENOUS | Status: DC
  Administered 2017-11-06 – 2017-11-09 (×3): 2 g via INTRAVENOUS
  Filled 2017-11-06 (×3): qty 2

## 2017-11-06 MED ORDER — DOXERCALCIFEROL 4 MCG/2ML IV SOLN: 1.0000 ug | INTRAVENOUS | Status: DC

## 2017-11-06 MED ORDER — SODIUM CHLORIDE 0.9% FLUSH
3.0000 mL | Freq: Two times a day (BID) | INTRAVENOUS | Status: DC
  Administered 2017-11-06 – 2017-11-08 (×4): 3 mL via INTRAVENOUS
  Administered 2017-11-09: 10 mL via INTRAVENOUS
  Administered 2017-11-09 – 2017-11-10 (×2): 3 mL via INTRAVENOUS
  Administered 2017-11-10 – 2017-11-11 (×2): 10 mL via INTRAVENOUS
  Administered 2017-11-13 – 2017-11-17 (×6): 3 mL via INTRAVENOUS

## 2017-11-06 MED ORDER — ACETAMINOPHEN 650 MG RE SUPP: 650.0000 mg | Freq: Four times a day (QID) | RECTAL | Status: DC | PRN

## 2017-11-06 MED ORDER — DOXERCALCIFEROL 4 MCG/2ML IV SOLN
5.0000 ug | INTRAVENOUS | Status: DC
  Administered 2017-11-09: 5 ug via INTRAVENOUS
  Filled 2017-11-06 (×5): qty 4

## 2017-11-06 MED ORDER — VANCOMYCIN HCL IN DEXTROSE 1-5 GM/200ML-% IV SOLN
1000.0000 mg | Freq: Once | INTRAVENOUS | Status: AC
  Administered 2017-11-06: 1000 mg via INTRAVENOUS
  Filled 2017-11-06 (×2): qty 200

## 2017-11-06 MED ORDER — CHLORHEXIDINE GLUCONATE 0.12% ORAL RINSE (MEDLINE KIT)
15.0000 mL | Freq: Two times a day (BID) | OROMUCOSAL | Status: DC
  Administered 2017-11-06 – 2017-11-17 (×22): 15 mL via OROMUCOSAL

## 2017-11-06 MED ORDER — LORAZEPAM 2 MG/ML IJ SOLN: 0.5000 mg | INTRAMUSCULAR | Status: AC | PRN

## 2017-11-06 MED ORDER — AMLODIPINE BESYLATE 10 MG PO TABS: 10.0000 mg | ORAL_TABLET | Freq: Every day | ORAL | Status: DC

## 2017-11-06 MED ORDER — IPRATROPIUM-ALBUTEROL 0.5-2.5 (3) MG/3ML IN SOLN
3.0000 mL | Freq: Three times a day (TID) | RESPIRATORY_TRACT | Status: DC
  Administered 2017-11-06 – 2017-11-14 (×23): 3 mL via RESPIRATORY_TRACT
  Filled 2017-11-06 (×23): qty 3

## 2017-11-06 MED ORDER — SODIUM CHLORIDE 0.9 % IV SOLN
500.0000 mg | INTRAVENOUS | Status: DC
  Administered 2017-11-09: 500 mg via INTRAVENOUS
  Filled 2017-11-06 (×4): qty 500

## 2017-11-06 NOTE — ED Notes (Signed)
Pt transported to MRI. VSS. Pt placed on a Venturi mask by respiratory prior to transport.

## 2017-11-06 NOTE — Progress Notes (Signed)
Pt came into HD with altered mental status; pt was resisting and refusing to sit still so that he could be cannulated; Dr. [name], Nephrologist was paged and he came to the HD unit. Upon arrival pt was still uncooperating with staff; the access arm was put in restraints and held down was able to successfully cannulate; and pt was given Ativan 0.25mg  IV before tx started to calm him down and HD tx resumed; pt had episodes of restlessness during tx and a second dose of Ativan 0.25mg  IV was administered. Pt noted resting; Heart rate (HR) noted going up to 130's and returning to 90's - low 100's 2.25 hrs into the tx. RN constantly assessing pt; at 2.45hr of HD tx HR dropped to 40's; called the pt name and was unresponsive but HR noted between 40's - 50's; Rapid response called; HD rinseback initiated and pt disconnected from the Machine; Code blue initiated with chest compressions; pt bagged; code team arrived and chest compressions resumed for about 5 mins; pt intubated on the unit and was sent to 2M13.

## 2017-11-06 NOTE — ED Notes (Signed)
Admitting MD paged to [facility] @ [phone].

## 2017-11-06 NOTE — Procedures (Signed)
Intubation Procedure Note REYAAN THOMA [id number] 1946/09/04  Procedure: Intubation   Procedure Details Consent: Unable to obtain consent because of emergent medical necessity. Time Out: Verified patient identification, verified procedure, site/side was marked, verified correct patient position, special equipment/implants available, medications/allergies/relevent history reviewed, required imaging and test results available.  Performed  Maximum sterile technique was used including gloves, gown, hand hygiene and mask.  MAC and 3    Evaluation Hemodynamic Status: BP stable throughout; O2 sats: stable throughout Patient's Current Condition: stable Complications: No apparent complications Patient did tolerate procedure well. Chest X-ray ordered to verify placement.  CXR: pending.   Ciro Backer 11/06/2017

## 2017-11-06 NOTE — ED Notes (Signed)
MD returned page. States to reassess PO medications at noon. Hold until then. MD made aware of incomplete dialysis treatment yesterday and wheezing. Will consult and medications ordered.

## 2017-11-06 NOTE — Progress Notes (Signed)
Patient is a 71 year old with history of COPD, CHF, end-stage renal disease on hemodialysis Monday Wednesday Friday, blindness, history of CVA, dementia who presents to the hospital secondary to altered mental status. CT scan of brain reports no acute intracranial abnormality. Patient had lumbar puncture by IR. I consulted nephrology who will dialyze patient secondary to groundglass appearance within the lungs on CT scan we suspect patient is fluid overloaded.  Otherwise please refer to H&P for details regarding assessment and plan.  We'll plan on reassessing next a.m.  Gen: pt in nad CV: no cyanosis Pulm: exp wheeze, decreased breath sounds at bases  Cameron Mendez, [employer]

## 2017-11-06 NOTE — Consult Note (Signed)
PULMONARY / CRITICAL CARE MEDICINE   Name: Cameron Mendez MRN: [id number] DOB: August 20, 1946    ADMISSION DATE:  10/16/2017 CONSULTATION DATE:  11/06/2016   REFERRING MD:  Dr. Wendee Beavers  CHIEF COMPLAINT:  11/06/2017  HISTORY OF PRESENT ILLNESS:   71 year old male with past medical history as below, which is significant for end-stage renal disease on dialysis, COPD, CHF, coronary artery disease, history of cardiac arrest, dementia, blindness, and history of stroke.  He was recently admitted to [facility] with acute encephalopathy which was poorly defined but did seem to improve and he was discharged home with a palliative care referral.  He presented again to [facility] on 11/27 with complaints of altered mental status and is accompanied by his wife.  There was some concern for infection including meningitis so he is covered with Rocephin and also azithromycin with concern for pneumonia.  Lumbar puncture was performed in the emergency department as well.  Due to this acute illness he did miss dialysis and was seen by nephrology as an inpatient.  They felt as though he is volume overloaded and he was transferred to the dialysis unit for HD.  During his dialysis treatment after about 2.5 L was removed he suffered a cardiac arrest and was PEA.  He was resuscitated for a total of 12 minutes.  PCCM has been asked to evaluate.  PAST MEDICAL HISTORY :  He  has a past medical history of Anemia, Arthritis, Blind, BPH (benign prostatic hyperplasia), Cardiac arrest ([place]), CHF (congestive heart failure) ([place]), CKD (chronic kidney disease), stage IV ([place]), Colon polyps, COPD (chronic obstructive pulmonary disease) ([place]), Diastolic dysfunction, Gangrene of finger ([place]), GI (gastrointestinal bleed) (feb 2016), Hypertension, Irregular heart rate (03/03/2015), Pericardial effusion, Retinitis pigmentosa, and Stroke (cerebrum) ([place]).  PAST SURGICAL HISTORY: He  has a past surgical history that includes  Cataract extraction w/ intraocular lens  implant, bilateral (Bilateral); Knee ligament reconstruction (Left); AV fistula placement (07/15/2012); Amputation (12/30/2012); Pericardiocentesis (09/2013); and pericardial tap (N/A, 09/19/2013).  Allergies  Allergen Reactions  . Ibuprofen Other (See Comments)    Kidney problems-dialysis patient  . Nsaids Other (See Comments)    Kidney problems-dialysis patient    No current facility-administered medications on file prior to encounter.    Current Outpatient Medications on File Prior to Encounter  Medication Sig  . acetaminophen (TYLENOL) 500 MG tablet Take 1,000 mg by mouth every 4 (four) hours as needed for mild pain.  [name] allopurinol (ZYLOPRIM) 100 MG tablet Take 100 mg by mouth daily.   [name] amLODipine (NORVASC) 5 MG tablet Take 5 mg by mouth daily.  . carvedilol (COREG) 12.5 MG tablet Take 1 tablet (12.5 mg total) by mouth 2 (two) times daily with a meal.  . diclofenac sodium (VOLTAREN) 1 % GEL Apply topically to back or knees every day as needed for pain  . diltiazem (CARDIZEM CD) 120 MG 24 hr capsule Take 1 capsule (120 mg total) by mouth daily.  [name] doxercalciferol (HECTOROL) 4 MCG/2ML injection Inject 0.52ml into the vein every Monday, Wednesday and Friday with hemodialysis  . feeding supplement (BOOST HIGH PROTEIN) LIQD Take 1 Container by mouth daily.  . ferric gluconate 125 mg in sodium chloride 0.9 % 100 mL Inject 125mg  into the vein every Monday, Wednesday and Friday with hemodialysis  . Amino Acids-Protein Hydrolys (FEEDING SUPPLEMENT, PRO-STAT SUGAR FREE 64,) LIQD Take 30 mLs by mouth 2 (two) times daily with a meal.  . pantoprazole (PROTONIX) 40 MG tablet Take  1 tablet (40 mg total) by mouth daily. Switch for any other PPI at similar dose and frequency (Patient not taking: Reported on 10/15/2017)  . SPIRIVA RESPIMAT 2.5 MCG/ACT AERS INHALE 2 PUFFS INTO THE LUNGS EVERY MORNING (Patient not taking: Reported on 10/15/2017)    FAMILY HISTORY:   His indicated that his mother is deceased. He indicated that his father is deceased. He indicated that the status of his brother is unknown. He indicated that the status of his neg hx is unknown.   SOCIAL HISTORY: He  reports that he has been smoking cigarettes.  He has a 25.00 pack-year smoking history. he has never used smokeless tobacco. He reports that he does not drink alcohol or use drugs.  REVIEW OF SYSTEMS:   unable  SUBJECTIVE:  unable  VITAL SIGNS: BP (!) 147/69   Pulse (!) 102   Temp 98.2 F (36.8 C) (Rectal)   Resp 18   Ht 5\' 3"  (1.6 m)   Wt 59 kg (130 lb)   SpO2 96%   BMI 23.03 kg/m   HEMODYNAMICS:    VENTILATOR SETTINGS: Vent Mode: PRVC FiO2 (%):  [28 %-100 %] 100 % Set Rate:  [16 bmp] 16 bmp Vt Set:  [550 mL] 550 mL PEEP:  [5 cmH20] 5 cmH20 Plateau Pressure:  [11 cmH20] 11 cmH20  INTAKE / OUTPUT: I/O last 3 completed shifts: In: 200 [IV Piggyback:200] Out: -   PHYSICAL EXAMINATION: General:  Frail chronically appearing elderly male in NAD Neuro:  Comatose GCS 3 HEENT:  [place]/AT, PERRL, no JVD Cardiovascular:  RRR, no MRG Lungs:  Coarse bilateral Abdomen:  Soft, non-distended Musculoskeletal:  No acute deformity, no edema Skin:  Grossly intact, very dry flaky.  LABS:  BMET Recent Labs  Lab 10/21/2017 1224 11/06/17 0338  NA 145 147*  K 3.8 4.0  CL 103 103  CO2 31 33*  BUN 31* 37*  CREATININE 10.58* 11.71*  GLUCOSE 94 108*    Electrolytes Recent Labs  Lab 10/18/2017 1224 11/06/17 0338  CALCIUM 10.4* 9.9    CBC Recent Labs  Lab 10/18/2017 1224 11/06/17 0338  WBC 5.1 4.1  HGB 9.1* 9.0*  HCT 31.9* 31.1*  PLT 74* 65*    Coag's No results for input(s): APTT, INR in the last 168 hours.  Sepsis Markers Recent Labs  Lab 11/01/2017 1951 11/06/17 0338 11/06/17 0609  LATICACIDVEN 2.80* 1.4 1.2    ABG No results for input(s): PHART, PCO2ART, PO2ART in the last 168 hours.  Liver Enzymes Recent Labs  Lab 11/03/2017 1224  11/06/17 0338  AST 20 18  ALT 10* 8*  ALKPHOS 59 54  BILITOT 1.1 0.6  ALBUMIN 3.4* 3.0*    Cardiac Enzymes No results for input(s): TROPONINI, PROBNP in the last 168 hours.  Glucose Recent Labs  Lab 11/03/2017 1209 11/04/2017 1233  GLUCAP 76 94    Imaging Ct Head Wo Contrast  Result Date: 10/20/2017 CLINICAL DATA:  Altered mental status at dialysis EXAM: CT HEAD WITHOUT CONTRAST TECHNIQUE: Contiguous axial images were obtained from the base of the skull through the vertex without intravenous contrast. COMPARISON:  CT 10/15/2017 FINDINGS: Brain: There is a right parietal approach shunt catheter with tip in the right lateral ventricle. The size and configuration of the ventricles are unchanged. There is no acute hemorrhage or evidence of acute cortical infarct. Prominent extra-axial CSF spaces are unchanged. There is severe periventricular hypoattenuation consistent with chronic small vessel ischemia. No midline shift or other mass effect. Old bilateral basal  ganglia lacunar infarcts. Vascular: No hyperdense vessel or unexpected calcification. Skull: Normal visualized skull base, calvarium and extracranial soft tissues. Sinuses/Orbits: Bilateral maxillary retention cysts. Normal orbits. IMPRESSION: 1. No acute intracranial abnormality. 2. Unchanged size and configuration of the shunted ventricles. 3. Severe atrophy and white matter disease consistent with chronic ischemic microangiopathy. Electronically Signed   By: Ulyses Jarred M.D.   On: 11/08/2017 21:55   Ct Chest Wo Contrast  Result Date: 11/06/2017 CLINICAL DATA:  Fever of unknown origin.  Pleural effusion. EXAM: CT CHEST WITHOUT CONTRAST TECHNIQUE: Multidetector CT imaging of the chest was performed following the standard protocol without IV contrast. COMPARISON:  Radiographs yesterday. FINDINGS: Cardiovascular: Dense aortic atherosclerosis, normal caliber aorta. Multi chamber cardiomegaly with coronary artery calcifications. Minimal  pericardial fluid. Mediastinum/Nodes: Shotty mediastinal nodes, increased in number abuts subcentimeter. The esophagus is decompressed. Visualized thyroid gland is normal. Lungs/Pleura: Mild emphysema. Mild septal thickening consistent pulmonary edema. Patchy ground-glass opacities throughout the left lung and perihilar right upper lobe. More consolidative opacities in the lingula and left upper lobe abutting the fissure and dependent right lower lobe. There is bronchial thickening. No definite endotracheal or endobronchial debris. Small bilateral pleural effusions, greater on the left. Calcified granuloma in the left upper lobe. Upper Abdomen: Upper normal spleen size. Vascular calcifications of abdominal vasculature. Atrophic kidneys. Musculoskeletal: Shunt catheter tubing in the right anterior chest wall. Degenerative change in the spine. There are no acute or suspicious osseous abnormalities. IMPRESSION: 1. Consolidations in the dependent right lower lobe and perifissural left upper lobe and lingula, may be atelectasis, pneumonia, or possibly aspiration. 2. Cardiomegaly with mild septal thickening/ pulmonary edema. Ground-glass opacities in the left lung and right upper lobe, likely alveolar pulmonary edema, less likely infectious pneumonitis. Small bilateral pleural effusions. 3. Aortic atherosclerosis coronary artery calcifications. Mild emphysema. Aortic Atherosclerosis (ICD10-I70.0) and Emphysema (ICD10-J43.9). Electronically Signed   By: Jeb Levering M.D.   On: 11/06/2017 02:55   Dg Fluoro Guide Lumbar Puncture  Result Date: 11/06/2017 CLINICAL DATA:  Altered mental status.  Concern for meningitis. EXAM: DIAGNOSTIC LUMBAR PUNCTURE UNDER FLUOROSCOPIC GUIDANCE FLUOROSCOPY TIME:  Radiation Exposure Index (as provided by the fluoroscopic device): 11.4 mGy If the device does not provide the exposure index: Fluoroscopy Time (in minutes and seconds):  1 minutes 36 seconds Number of Acquired Images:  1  PROCEDURE: Informed consent was obtained from the patient prior to the procedure, including potential complications of headache, allergy, and pain. With the patient prone, the lower back was prepped with Betadine. 1% Lidocaine was used for local anesthesia. Lumbar puncture was performed at the L3-L4 level using a 20 gauge needle with return of straw-colored CSF with an estimated opening pressure of 15 cm water. 8 ml of CSF were obtained for laboratory studies. The patient tolerated the procedure well and there were no apparent complications. IMPRESSION: Successful lumbar puncture with straw-colored CSF. Electronically Signed   By: Suzy Bouchard M.D.   On: 11/06/2017 11:29    STUDIES:  LP 11/27 > clear, gluc 59, RBC 1, color yellow, xanthochromic, protein 276, WBC 4. CT chest 11/27 > Consolidations in the dependent right lower lobe and perifissural left upper lobe and lingula. Cardiomegaly with mild septal thickening/ pulmonary edema. Ground-glass opacities in the left lung and right upper lobe, likely alveolar pulmonary edema, less likely infectious pneumonitis. Small bilateral pleural effusions. Aortic atherosclerosis coronary artery calcifications. Mild emphysema. CT head 11/27 > No acute intracranial abnormality. Unchanged size and configuration of the shunted ventricles. Severe atrophy and  white matter disease consistent with chronic ischemic microangiopathy.  CULTURES: CSF 11/27 > Blood 11/27 >  ANTIBIOTICS: Cefepime 11/27 > CTX 11/27 > Azithro 11/27 > Vanco 11/27 >  SIGNIFICANT EVENTS: 11/27 admit  LINES/TUBES: 11/27 ETT >  DISCUSSION: 71 year old male with   ASSESSMENT / PLAN:  PULMONARY A: Acute respiratory failure secondary to cardiac arrest COPD without acute exacerbation (has send RB in past 2016) ? Pulmonary edema now s/p HD with 2L removed  P:   Full vent support ABG  CXR VAP bundle Scheduled duoneb  CARDIOVASCULAR A: Cardiac arrest PEA 12 mins Acute on  chronic diastolic CHF Hx HTN  P:  EKG S/p 1 L back Start low dose pressors via PIV (capped 65mcg) No CVL DNR if arrests  RENAL A:   ESRD on HD  P:   CMP, mag, phos now Nephrology following  GASTROINTESTINAL A:   GERD Hx GIB  P:   Protonix  HEMATOLOGIC A:   Thrombocytopenia  Anemia  P:  SQ heparin Follow CBC Transfuse per ICU protocol  INFECTIOUS A:   HCAP? Also concern for meningitis on presentation. CSF not diagnostic   P:   Continue ABX as above Low threshold to DC DC CTX as CSF not indicating meningitis Assess PCT  ENDOCRINE A:   No acute issues  P:   Follow glucose on chemistry  NEUROLOGIC A:   Acute metabolic/anoxic encephalopathy Dementia Hx stroke Blind P:   RASS goal: 0 PRN fent/versed for RASS goal MRI brain pending Consider EEG  FAMILY  - Updates: wife updated, understands severity of chronic illness and acute situation as well. DNR. No escalation. No procedures.   - Inter-disciplinary family meet or Palliative Care meeting due by:  12/3   Georgann Housekeeper, AGACNP-BC [facility] Pager [phone] or [phone]  11/06/2017 7:06 PM

## 2017-11-06 NOTE — Procedures (Signed)
Informed consent was obtained from the patient prior to the procedure, including potential complications of headache, allergy, and pain. With the patient prone, the lower back was prepped with Betadine. 1% Lidocaine was used for local anesthesia. Lumbar puncture was performed at the [L3-L4] level using a [20] gauge needle with return of [straw-colored] CSF with an estimated opening pressure of [15] cm water. [8] ml of CSF were obtained for laboratory studies. The patient tolerated the procedure well and there were no apparent complications.  IMPRESSION: [Successful lumbar puncture with straw-colored CSF.]

## 2017-11-06 NOTE — Consult Note (Signed)
Renal Service Consult Note [facility]  Cameron Mendez 11/06/2017 [place] Requesting Physician:  Dr Wendee Beavers   Reason for Consult:  AMS, ESRD pt HPI: The patient is a 71 y.o. year-old with hx of blindness, COPD, CHF, DJD, dementia, cardiac arrest, HTN, CVA and ESRD on HD MWF schedule. Taken to ED by caregiver saying he has not been eating well, can't chew or swallow food.  Hx dementia but MS worse x 1 wk.  In ED pt was febrile to 101 deg, BP's high and WBC 4k, plt 65.  CXR showed edema bilat and CT chest showed edema + also some small areas of consolidation, poss PNA.  Pt being admitted. Asked to see for ESRD.   Pt unable to give any history.   Last admit was 11/5- 11/6 , 2018 , AMS, dec'd appetite.  Coughing.  AMS improved overnight, abx stopped and pt was dc'd home.    Was admitted prior to that in Jan 2018 with HCAP, symptomatic anemia, +hemoccult, given 1u prbc. PPI added, no active bleeding, ok to resume baby asa.  No procedures done. Hx low plts.    Past Medical History  Past Medical History:  Diagnosis Date  . Anemia   . Arthritis    Gout  . Blind   . BPH (benign prostatic hyperplasia)   . Cardiac arrest ([place])   . CHF (congestive heart failure) ([place])   . CKD (chronic kidney disease), stage IV ([place])    a. L upper extremity AV fistula created 07/2012.  . Colon polyps   . COPD (chronic obstructive pulmonary disease) ([place])   . Diastolic dysfunction   . Gangrene of finger ([place])    a. 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  . GI (gastrointestinal bleed) feb 2016  . Hypertension    dx--"long time"  . Irregular heart rate 03/03/2015  . Pericardial effusion    a. Noted on echo 10/2009. b. Again seen on echo 09/2013.  [name] Retinitis pigmentosa    Blindness--has shunt --placed yrs ago in [place]..  . Stroke (cerebrum) ([place])    12/19/16 CT scan revealed old left lacunar infarct   Past Surgical History  Past Surgical History:  Procedure Laterality Date   . AMPUTATION  12/30/2012   Procedure: AMPUTATION DIGIT;  Surgeon: Tennis Must, MD;  Location: [facility];  Service: Orthopedics;  Laterality: Left;  LEFT SMALL FINGER AMPUTATION  . AV FISTULA PLACEMENT  07/15/2012   Procedure: ARTERIOVENOUS (AV) FISTULA CREATION;  Surgeon: Rosetta Posner, MD;  Location: [facility];  Service: Vascular;  Laterality: Left;  . CATARACT EXTRACTION W/ INTRAOCULAR LENS  IMPLANT, BILATERAL Bilateral   . KNEE LIGAMENT RECONSTRUCTION Left   . PERICARDIAL TAP N/A 09/19/2013   Procedure: PERICARDIAL TAP;  Surgeon: Blane Ohara, MD;  Location[facility] C[facility];  Service: Cardiovascular;  Laterality: N/A;  . PERICARDIOCENTESIS  09/2013   Family History  Family History  Problem Relation Age of Onset  . Ovarian cancer Mother   . Colon polyps Brother   . Colon cancer Brother   . Kidney disease Neg Hx   . Gallbladder disease Neg Hx   . Esophageal cancer Neg Hx   . Heart disease Neg Hx   . Stomach cancer Neg Hx    Social History  reports that he has been smoking cigarettes.  He has a 25.00 pack-year smoking history. he has never used smokeless tobacco. He reports that he does not drink alcohol or use  drugs. Allergies  Allergies  Allergen Reactions  . Ibuprofen Other (See Comments)    Kidney problems-dialysis patient  . Nsaids Other (See Comments)    Kidney problems-dialysis patient   Home medications Prior to Admission medications   Medication Sig Start Date End Date Taking? Authorizing Provider  acetaminophen (TYLENOL) 500 MG tablet Take 1,000 mg by mouth every 4 (four) hours as needed for mild pain.   Yes [provider]  allopurinol (ZYLOPRIM) 100 MG tablet Take 100 mg by mouth daily.    Yes [provider]  amLODipine (NORVASC) 5 MG tablet Take 5 mg by mouth daily.   Yes [provider]  carvedilol (COREG) 12.5 MG tablet Take 1 tablet (12.5 mg total) by mouth 2 (two) times daily with a meal. 09/21/15  Yes Bonnielee Haff, MD   diclofenac sodium (VOLTAREN) 1 % GEL Apply topically to back or knees every day as needed for pain   Yes [provider]  diltiazem (CARDIZEM CD) 120 MG 24 hr capsule Take 1 capsule (120 mg total) by mouth daily. 10/01/15  Yes Dixie Dials, MD  doxercalciferol (HECTOROL) 4 MCG/2ML injection Inject 0.61ml into the vein every Monday, Wednesday and Friday with hemodialysis   Yes [provider]  feeding supplement (BOOST HIGH PROTEIN) LIQD Take 1 Container by mouth daily.   Yes [provider]  ferric gluconate 125 mg in sodium chloride 0.9 % 100 mL Inject 125mg  into the vein every Monday, Wednesday and Friday with hemodialysis   Yes [provider]  Amino Acids-Protein Hydrolys (FEEDING SUPPLEMENT, PRO-STAT SUGAR FREE 64,) LIQD Take 30 mLs by mouth 2 (two) times daily with a meal.    [provider]  pantoprazole (PROTONIX) 40 MG tablet Take 1 tablet (40 mg total) by mouth daily. Switch for any other PPI at similar dose and frequency Patient not taking: Reported on 10/15/2017 12/21/16   Dhungel, Flonnie Overman, MD  SPIRIVA RESPIMAT 2.5 MCG/ACT AERS INHALE 2 PUFFS INTO THE LUNGS EVERY MORNING Patient not taking: Reported on 10/15/2017 08/03/15   [name], MD   Liver Function Tests Recent Labs  Lab 10/24/2017 1224 11/06/17 0338  AST 20 18  ALT 10* 8*  ALKPHOS 59 54  BILITOT 1.1 0.6  PROT 7.8 7.0  ALBUMIN 3.4* 3.0*   No results for input(s): LIPASE, AMYLASE in the last 168 hours. CBC Recent Labs  Lab 11/08/2017 1224 11/06/17 0338  WBC 5.1 4.1  HGB 9.1* 9.0*  HCT 31.9* 31.1*  MCV 98.8 100.6*  PLT 74* 65*   Basic Metabolic Panel Recent Labs  Lab 11/01/2017 1224 11/06/17 0338  NA 145 147*  K 3.8 4.0  CL 103 103  CO2 31 33*  GLUCOSE 94 108*  BUN 31* 37*  CREATININE 10.58* 11.71*  CALCIUM 10.4* 9.9   Iron/TIBC/Ferritin/ %Sat    Component Value Date/Time   IRON 46 12/16/2016 1057   TIBC 176 (L) 12/16/2016 1057   FERRITIN 1,185 (H)  11/18/2015 0955   IRONPCTSAT 26 12/16/2016 1057   IRONPCTSAT 22 05/18/2009 1203    Vitals:   11/06/17 0800 11/06/17 1058 11/06/17 1200 11/06/17 1202  BP: (!) 160/76  (!) 200/81 (!) 200/81  Pulse: 77  84 78  Resp: 16  16 14   Temp:  98.2 F (36.8 C)    TempSrc:  Rectal    SpO2: 94%  100% 99%  Weight:      Height:       Exam Gen blind AAM, lying on his  side, responds , Ox 1 No rash, cyanosis or gangrene Sclera anicteric, throat clear and dry  No jvd or bruits Chest clear bilat RRR no MRG Abd soft ntnd no mass or ascites +bs GU normal male MS no joint effusions or deformity Ext 1+ LE edema / no wounds or ulcers Neuro is lethargic, arousable LUE AVF+bruit    Dialysis:  [place] MWF 4h   2/2.25  Hep none  LUE AVF   Dry wt pend -meds pending   Impression: 1. AMS - here with same issue last month. Hx of dementia.  2. ESRD MWF HD 3. Pulm edema - due to vol overload. Edema + on CXR 4. HTN/ vol - as above, BP's up and vol up.  5. Anemia of CKD - get records 6. MBD of CKD - get records 7. Blindness 8. Dementia   Plan - HD today, get vol down  Kelly Splinter MD [place] pager [phone]   11/06/2017, 1:25 PM

## 2017-11-06 NOTE — Progress Notes (Signed)
   11/06/17 2000  Clinical Encounter Type  Visited With Patient and family together  Visit Type Code  Spiritual Encounters  Spiritual Needs Emotional;Prayer  Chaplain was called to code.  Found wife located in the ED.  When patient was transported to unit Chaplain stood with her and prayed.  Wife of PT talked about all the death she experienced over the last few years at this hospital and was not ready for her husband to be the next one she would have to mourn.  We talked about family dynamics and prayer

## 2017-11-06 NOTE — Progress Notes (Signed)
Pharmacy Antibiotic Note  Cameron Mendez is a 71 y.o. male admitted on 11/02/2017 with pneumonia.  Pharmacy has been consulted for Vancomycin/Cefepime dosing. ESRD on HD MWF. WBC WNL.   Plan: Vancomycin 1000 mg IV x 1, the 500 mg IV qHD MWF Cefepime 2g IV x 1 now, then at 1800 on HD days (MWF) Trend WBC, temp, HD schedule  F/U infectious work-up Drug levels as indicated   Height: 5\' 3"  (160 cm) Weight: 130 lb (59 kg) IBW/kg (Calculated) : 56.9  Temp (24hrs), Avg:100.8 F (38.2 C), Min:99.9 F (37.7 C), Max:101.6 F (38.7 C)  Recent Labs  Lab 10/19/2017 1224 10/23/2017 1951  WBC 5.1  --   CREATININE 10.58*  --   LATICACIDVEN  --  2.80*    Estimated Creatinine Clearance: 5.2 mL/min (A) (by C-G formula based on SCr of 10.58 mg/dL (H)).    Allergies  Allergen Reactions  . Ibuprofen Other (See Comments)    Kidney problems-dialysis patient  . Nsaids Other (See Comments)    Kidney problems-dialysis patient     Narda Bonds 11/06/2017 2:48 AM

## 2017-11-06 NOTE — Progress Notes (Signed)
CRITICAL VALUE ALERT  Critical Value:  Lactic 20.4  Date & Time Notied:  11/06/17 8:10 PM  Provider Notified: Dr. Calla Kicks  Orders Received/Actions taken: MD made aware

## 2017-11-06 NOTE — H&P (Signed)
TRH H&P   Patient Demographics:    Cameron Mendez, is a 71 y.o. male  MRN: [id number]   DOB - Mar 05, 1946  Admit Date - 10/31/2017  Outpatient Primary MD for the patient is [name], MD  Referring MD/NP/PA: Tanna Furry  Outpatient Specialists:   Patient coming from: dialysis  Chief Complaint  Patient presents with  . Weakness      HPI:    Cameron Mendez  is a 71 y.o. 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  Pt was apparently not as responsive per his wife. It was gradually getting worse over the past week and then today in particular worse.  Per wife cough for a few days now w slight green tinge per his wife.   He didn't have dialysis today and was sent  To ED for evaluation  In ED,   CT brain IMPRESSION: 1. No acute intracranial abnormality. 2. Unchanged size and configuration of the shunted ventricles. 3. Severe atrophy and white matter disease consistent with chronic ischemic microangiopathy.  CXR  IMPRESSION: Mild edema/ CHF.  Small effusions.  There was concern for meningitis so pt was given rocephin 2gm iv x1 in the ED along with zithromax 523m iv x1 to cover for pneumonia ?  Pt will be admitted for AMS and fever of unclear source.    Review of systems:    In addition to the HPI above, Slight cough  No Fever-chills, No Headache, No changes with Vision or hearing, No problems swallowing food or Liquids, No Chest pain,  Shortness of Breath, No Abdominal pain, No Nausea or Vommitting, Bowel movements are regular, No Blood in stool or Urine, No dysuria, No new skin rashes or bruises, No new joints pains-aches,  No new weakness, tingling, numbness in any extremity, No recent weight gain or loss, No  polyuria, polydypsia or polyphagia, No significant Mental Stressors.  A full 10 point Review of Systems was done, except as stated above, all other Review of Systems were negative.   With Past History of the following :    Past Medical History:  Diagnosis Date  . Anemia   . Arthritis    Gout  . Blind   . BPH (benign prostatic hyperplasia)   . Cardiac arrest ([facility]   . CHF ([place]estive heart failure) ([place]   . CKD (chronic kidney disease), stage IV ([facility]    a. L upper extremity AV fistula created 07/2012.  . Colon polyps   . COPD (chronic obstructive pulmonary disease) ([facility]   . Diastolic dysfunction   . Gangrene of finger ([place]    a. 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  . GI (gastrointestinal bleed) feb 2016  . Hypertension    dx--"long time"  . Irregular  heart rate 03/03/2015  . Pericardial effusion    a. Noted on echo 10/2009. b. Again seen on echo 09/2013.  [name] Retinitis pigmentosa    Blindness--has shunt --placed yrs ago in [place]..  . Stroke (cerebrum) ([place])    12/19/16 CT scan revealed old left lacunar infarct      Past Surgical History:  Procedure Laterality Date  . AMPUTATION  12/30/2012   Procedure: AMPUTATION DIGIT;  Surgeon: Tennis Must, MD;  Location: [facility];  Service: Orthopedics;  Laterality: Left;  LEFT SMALL FINGER AMPUTATION  . AV FISTULA PLACEMENT  07/15/2012   Procedure: ARTERIOVENOUS (AV) FISTULA CREATION;  Surgeon: Rosetta Posner, MD;  Location: [facility];  Service: Vascular;  Laterality: Left;  . CATARACT EXTRACTION W/ INTRAOCULAR LENS  IMPLANT, BILATERAL Bilateral   . KNEE LIGAMENT RECONSTRUCTION Left   . PERICARDIAL TAP N/A 09/19/2013   Procedure: PERICARDIAL TAP;  Surgeon: Blane Ohara, MD;  Location[facility] CATH LAB;  Service: Cardiovascular;  Laterality: N/A;  . PERICARDIOCENTESIS  09/2013      Social History:     Social History   Tobacco Use  . Smoking status: Current Every Day Smoker    Packs/day: 0.50    Years:  50.00    Pack years: 25.00    Types: Cigarettes  . Smokeless tobacco: Never Used  . Tobacco comment: Pt given handout on how to quit smoking 12/31/14  Substance Use Topics  . Alcohol use: No    Alcohol/week: 0.0 oz    Comment: "quit driinking in ~ 2000"     Lives - at home  Mobility - walks w walker   Family History :     Family History  Problem Relation Age of Onset  . Ovarian cancer Mother   . Colon polyps Brother   . Colon cancer Brother   . Kidney disease Neg Hx   . Gallbladder disease Neg Hx   . Esophageal cancer Neg Hx   . Heart disease Neg Hx   . Stomach cancer Neg Hx      Home Medications:   Prior to Admission medications   Medication Sig Start Date End Date Taking? Authorizing Provider  acetaminophen (TYLENOL) 500 MG tablet Take 1,000 mg by mouth every 4 (four) hours as needed for mild pain.   Yes [provider]  allopurinol (ZYLOPRIM) 100 MG tablet Take 100 mg by mouth daily.    Yes [provider]  amLODipine (NORVASC) 5 MG tablet Take 5 mg by mouth daily.   Yes [provider]  carvedilol (COREG) 12.5 MG tablet Take 1 tablet (12.5 mg total) by mouth 2 (two) times daily with a meal. 09/21/15  Yes Bonnielee Haff, MD  diclofenac sodium (VOLTAREN) 1 % GEL Apply topically to back or knees every day as needed for pain   Yes [provider]  diltiazem (CARDIZEM CD) 120 MG 24 hr capsule Take 1 capsule (120 mg total) by mouth daily. 10/01/15  Yes Dixie Dials, MD  doxercalciferol (HECTOROL) 4 MCG/2ML injection Inject 0.58m into the vein every Monday, Wednesday and Friday with hemodialysis   Yes [provider]  feeding supplement (BOOST HIGH PROTEIN) LIQD Take 1 Container by mouth daily.   Yes [provider]  ferric gluconate 125 mg in sodium chloride 0.9 % 100 mL Inject 1238minto the vein every Monday, Wednesday and Friday with hemodialysis   Yes [provider]  Amino Acids-Protein Hydrolys (FEEDING  SUPPLEMENT, PRO-STAT SUGAR FREE 64,) LIQD Take  30 mLs by mouth 2 (two) times daily with a meal.    [provider]  pantoprazole (PROTONIX) 40 MG tablet Take 1 tablet (40 mg total) by mouth daily. Switch for any other PPI at similar dose and frequency Patient not taking: Reported on 10/15/2017 12/21/16   Dhungel, Flonnie Overman, MD  SPIRIVA RESPIMAT 2.5 MCG/ACT AERS INHALE 2 PUFFS INTO THE LUNGS EVERY MORNING Patient not taking: Reported on 10/15/2017 08/03/15   [name], MD     Allergies:     Allergies  Allergen Reactions  . Ibuprofen Other (See Comments)    Kidney problems-dialysis patient  . Nsaids Other (See Comments)    Kidney problems-dialysis patient     Physical Exam:   Vitals  Blood pressure (!) 196/81, pulse 91, temperature (!) 101.6 F (38.7 C), temperature source Rectal, resp. rate (!) 25, height 5' 3"  (1.6 m), weight 59 kg (130 lb), SpO2 92 %.   1. General  lying in bed in NAD,    2. Normal affect and insight, Not Suicidal or Homicidal, Awake Alert, Oriented X 3.  3. No F.N deficits, ALL C.Nerves Intact, Strength 5/5 all 4 extremities, Sensation intact all 4 extremities, Plantars down going.  4. Ears and Eyes appear Normal, Conjunctivae clear, PERRLA. Moist Oral Mucosa.  5. Supple Neck, No JVD, No cervical lymphadenopathy appriciated, No Carotid Bruits.  6. Symmetrical Chest wall movement, Good air movement bilaterally, slight decrease in bs at bilateral base.  No wheezing. , faint crackle left base.   7. RRR, No Gallops, Rubs or Murmurs, No Parasternal Heave.  8. Positive Bowel Sounds, Abdomen Soft, No tenderness, No organomegaly appriciated,No rebound -guarding or rigidity.  9.  No Cyanosis, Normal Skin Turgor, No Skin Rash or Bruise.  10. Good muscle tone,  joints appear normal , no effusions, Normal ROM.  11. No Palpable Lymph Nodes in Neck or Axillae  L AVF   Data Review:    CBC Recent Labs  Lab 11/03/2017 1224  WBC 5.1  HGB 9.1*  HCT  31.9*  PLT 74*  MCV 98.8  MCH 28.2  MCHC 28.5*  RDW 19.1*   ------------------------------------------------------------------------------------------------------------------  Chemistries  Recent Labs  Lab 10/29/2017 1224  NA 145  K 3.8  CL 103  CO2 31  GLUCOSE 94  BUN 31*  CREATININE 10.58*  CALCIUM 10.4*  AST 20  ALT 10*  ALKPHOS 59  BILITOT 1.1   ------------------------------------------------------------------------------------------------------------------ estimated creatinine clearance is 5.2 mL/min (A) (by C-G formula based on SCr of 10.58 mg/dL (H)). ------------------------------------------------------------------------------------------------------------------ No results for input(s): TSH, T4TOTAL, T3FREE, THYROIDAB in the last 72 hours.  Invalid input(s): FREET3  Coagulation profile No results for input(s): INR, PROTIME in the last 168 hours. ------------------------------------------------------------------------------------------------------------------- No results for input(s): DDIMER in the last 72 hours. -------------------------------------------------------------------------------------------------------------------  Cardiac Enzymes No results for input(s): CKMB, TROPONINI, MYOGLOBIN in the last 168 hours.  Invalid input(s): CK ------------------------------------------------------------------------------------------------------------------    Component Value Date/Time   BNP 1,724.6 (H) 10/16/2017 0450     ---------------------------------------------------------------------------------------------------------------  Urinalysis    Component Value Date/Time   COLORURINE YELLOW 11/18/2015 0902   APPEARANCEUR CLOUDY (A) 11/18/2015 0902   LABSPEC 1.011 11/18/2015 0902   LABSPEC 1.015 03/09/2009 1116   PHURINE 8.5 (H) 11/18/2015 0902   GLUCOSEU NEGATIVE 11/18/2015 0902   HGBUR SMALL (A) 11/18/2015 0902   BILIRUBINUR NEGATIVE 11/18/2015 0902     BILIRUBINUR Negative 03/09/2009 1116   KETONESUR NEGATIVE 11/18/2015 0902   PROTEINUR 100 (A) 11/18/2015 0902   UROBILINOGEN 0.2  03/03/2015 1950   NITRITE NEGATIVE 11/18/2015 0902   LEUKOCYTESUR SMALL (A) 11/18/2015 0902   LEUKOCYTESUR Trace 03/09/2009 1116    ----------------------------------------------------------------------------------------------------------------   Imaging Results:    Dg Chest 2 View  Result Date: 10/14/2017 CLINICAL DATA:  Cough, altered mental status EXAM: CHEST  2 VIEW COMPARISON:  10/26/2017 FINDINGS: Cardiomegaly with vascular congestion and bilateral interstitial and alveolar opacities most compatible with edema. Small effusions. No acute bony abnormality. VP shunt catheter noted in the right chest wall. IMPRESSION: Mild edema/ CHF.  Small effusions. Electronically Signed   By: Rolm Baptise M.D.   On: 11/04/2017 16:57   Ct Head Wo Contrast  Result Date: 10/12/2017 CLINICAL DATA:  Altered mental status at dialysis EXAM: CT HEAD WITHOUT CONTRAST TECHNIQUE: Contiguous axial images were obtained from the base of the skull through the vertex without intravenous contrast. COMPARISON:  CT 10/15/2017 FINDINGS: Brain: There is a right parietal approach shunt catheter with tip in the right lateral ventricle. The size and configuration of the ventricles are unchanged. There is no acute hemorrhage or evidence of acute cortical infarct. Prominent extra-axial CSF spaces are unchanged. There is severe periventricular hypoattenuation consistent with chronic small vessel ischemia. No midline shift or other mass effect. Old bilateral basal ganglia lacunar infarcts. Vascular: No hyperdense vessel or unexpected calcification. Skull: Normal visualized skull base, calvarium and extracranial soft tissues. Sinuses/Orbits: Bilateral maxillary retention cysts. Normal orbits. IMPRESSION: 1. No acute intracranial abnormality. 2. Unchanged size and configuration of the shunted ventricles.  3. Severe atrophy and white matter disease consistent with chronic ischemic microangiopathy. Electronically Signed   By: Ulyses Jarred M.D.   On: 10/30/2017 21:55     Assessment & Plan:    Principal Problem:   Fever Active Problems:   Essential hypertension, benign   ESRD on hemodialysis (HCC)   Acute encephalopathy    Fever, cough Blood culture x2 Respiratory virus panel CT chest  tx for hcap vanco iv pharmacy to dose, cefepime iv pharmacy to dose  Acute encephalopathy ? Secondary to infection Check b12, folate, esr, rpr, tsh Low suspicion for meningitis IR LP in am  ESRD on HD (M, W, F) Please consult nephrology in am for HD  Hypertension uncontrolled Cont carvedilol Cont cardizem Hydralazine 60m iv q6h prn   Copd Cont spiriva  Gout Cont allopurinol        DVT Prophylaxis Heparin -  SCDs  AM Labs Ordered, also please review Full Orders  Family Communication: Admission, patients condition and plan of care including tests being ordered have been discussed with the patient's wife  who indicate understanding and agree with the plan and Code Status.  Code Status FULL CODE per his wife  Likely DC to  home  Condition GUARDED    Consults called: none  Admission status: inpatient   Time spent in minutes : 45   JJani GravelM.D on 11/06/2017 at 12:00 AM  Between 7am to 7pm - Pager - [phone] After 7pm go to [email or web address] - password [facility] - Office  [phone]

## 2017-11-06 NOTE — Code Documentation (Signed)
  Patient Name: Cameron Mendez   MRN: [id number]   Date of Birth/ Sex: March 25, 1946 , male      Admission Date: 11/09/2017  Attending Provider: Velvet Bathe, MD  Primary Diagnosis: Fever   Indication: Pt was in his usual state of health until this PM, when he was noted to be unresponsive and lost pulse during HD. Code blue was subsequently called. At the time of arrival on scene, ACLS protocol was underway.   Technical Description:  - CPR performance duration:  12 minutes  - Was defibrillation or cardioversion used? No   - Was external pacer placed? No  - Was patient intubated pre/post CPR? Yes   Medications Administered: Y = Yes; Blank = No Amiodarone    Atropine    Calcium    Epinephrine  Y (2)  Lidocaine    Magnesium    Norepinephrine    Phenylephrine    Sodium bicarbonate    Vasopressin     Post CPR evaluation:  - Final Status - Was patient successfully resuscitated ? Yes - What is current rhythm? Sinus Tachycardia - What is current hemodynamic status? Yes  Miscellaneous Information:  - Labs sent, including: BMP, Mg, Lactic Acid, Troponin, CXR, EKG  - Primary team notified?  Yes  - Family Notified? Yes  - Additional notes/ transfer status: Per HD nurse patient presented to HD and was agitated with tachycardia. Subsequently became bradycardic and unresponsive. Pulses were not palpable. Code blue for PEA was called. Patient was coded for 12 minutes with 2 rounds of epinephrine given prior to ROSC. Now sinus tachycardia with SBP 150 and DBP 80. Transfer to ICU.      Ina Homes, MD  11/06/2017, 5:58 PM

## 2017-11-06 NOTE — ED Notes (Signed)
Nephrology at bedside

## 2017-11-06 NOTE — Progress Notes (Signed)
Patient transported from 2M13 to MRI and back without any complicaitons.

## 2017-11-06 NOTE — Progress Notes (Signed)
eLink Physician-Brief Progress Note Patient Name: Cameron Mendez DOB: Jul 28, 1946 MRN: [id number]   Date of Service  11/06/2017  HPI/Events of Note  Best Practice  eICU Interventions  PPI ordered for stress ulcer propy while intubated     Intervention Category Intermediate Interventions: Best-practice therapies (e.g. DVT, beta blocker, etc.)  Cathryn Gallery 11/06/2017, 8:50 PM

## 2017-11-06 NOTE — Progress Notes (Signed)
Hypoglycemic Event  CBG: 58  Treatment: D50 IV 25 mL  Symptoms: None  Follow-up CBG: Time:2047 CBG Result: 80  Possible Reasons for Event: Inadequate meal intake  Comments/MD notified: [place]

## 2017-11-07 ENCOUNTER — Inpatient Hospital Stay (HOSPITAL_COMMUNITY): Payer: Medicare Other

## 2017-11-07 ENCOUNTER — Other Ambulatory Visit: Payer: Self-pay

## 2017-11-07 DIAGNOSIS — G9340 Encephalopathy, unspecified: Secondary | ICD-10-CM

## 2017-11-07 LAB — BASIC METABOLIC PANEL
ANION GAP: 21 — AB (ref 5–15)
BUN: 26 mg/dL — ABNORMAL HIGH (ref 6–20)
CALCIUM: 8.4 mg/dL — AB (ref 8.9–10.3)
CO2: 20 mmol/L — AB (ref 22–32)
CREATININE: 7.7 mg/dL — AB (ref 0.61–1.24)
Chloride: 97 mmol/L — ABNORMAL LOW (ref 101–111)
GFR, EST AFRICAN AMERICAN: 7 mL/min — AB (ref >60)
GFR, EST NON AFRICAN AMERICAN: 6 mL/min — AB (ref >60)
Glucose, Bld: 107 mg/dL — ABNORMAL HIGH (ref 65–99)
Potassium: 3.9 mmol/L (ref 3.5–5.1)
Sodium: 138 mmol/L (ref 135–145)

## 2017-11-07 LAB — GLUCOSE, CAPILLARY
GLUCOSE-CAPILLARY: 96 mg/dL (ref 65–99)
GLUCOSE-CAPILLARY: 81 mg/dL (ref 65–99)
GLUCOSE-CAPILLARY: 45 mg/dL — AB (ref 65–99)
GLUCOSE-CAPILLARY: 74 mg/dL (ref 65–99)
Glucose-Capillary: 72 mg/dL (ref 65–99)
Glucose-Capillary: 111 mg/dL — ABNORMAL HIGH (ref 65–99)

## 2017-11-07 LAB — PROCALCITONIN: PROCALCITONIN: 65.79 ng/mL

## 2017-11-07 LAB — CBC
HCT: 36.8 % — ABNORMAL LOW (ref 39.0–52.0)
HEMOGLOBIN: 11.2 g/dL — AB (ref 13.0–17.0)
MCH: 29.4 pg (ref 26.0–34.0)
MCHC: 30.4 g/dL (ref 30.0–36.0)
MCV: 96.6 fL (ref 78.0–100.0)
PLATELETS: 68 10*3/uL — AB (ref 150–400)
RBC: 3.81 MIL/uL — AB (ref 4.22–5.81)
RDW: 19.2 % — ABNORMAL HIGH (ref 11.5–15.5)
WBC: 10.8 10*3/uL — ABNORMAL HIGH (ref 4.0–10.5)

## 2017-11-07 LAB — TROPONIN I
TROPONIN I: 0.26 ng/mL — AB (ref <0.03)
TROPONIN I: 0.31 ng/mL — AB (ref <0.03)

## 2017-11-07 MED ORDER — LABETALOL HCL 5 MG/ML IV SOLN
10.0000 mg | INTRAVENOUS | Status: DC | PRN
  Administered 2017-11-07 – 2017-11-11 (×6): 10 mg via INTRAVENOUS
  Filled 2017-11-07 (×5): qty 4

## 2017-11-07 MED ORDER — PRO-STAT SUGAR FREE PO LIQD
30.0000 mL | Freq: Two times a day (BID) | ORAL | Status: DC
  Administered 2017-11-07 – 2017-11-13 (×10): 30 mL
  Filled 2017-11-07 (×14): qty 30

## 2017-11-07 MED ORDER — NEPRO/CARBSTEADY PO LIQD
1000.0000 mL | ORAL | Status: DC
  Administered 2017-11-07 – 2017-11-11 (×4): 1000 mL
  Filled 2017-11-07 (×7): qty 1000

## 2017-11-07 MED ORDER — DEXTROSE 50 % IV SOLN
INTRAVENOUS | Status: AC
  Administered 2017-11-07: 50 mL
  Filled 2017-11-07: qty 50

## 2017-11-07 MED ORDER — PANTOPRAZOLE SODIUM 40 MG PO PACK
40.0000 mg | PACK | Freq: Every day | ORAL | Status: DC
  Administered 2017-11-07 – 2017-11-10 (×4): 40 mg
  Filled 2017-11-07 (×5): qty 20

## 2017-11-07 MED FILL — Medication: Qty: 1 | Status: AC

## 2017-11-07 NOTE — Progress Notes (Signed)
EEG complete - results pending 

## 2017-11-07 NOTE — Progress Notes (Signed)
MD called regarding no order for repeat lactic acid. MD stated no need for repeat d/t the pts lactic is trending down. Nursing will continue to monitor.

## 2017-11-07 NOTE — Procedures (Signed)
ELECTROENCEPHALOGRAM REPORT  Date of Study: 11/07/2017  Patient's Name: Cameron Mendez MRN: [id number] Date of Birth: 01/25/1946  Referring Provider: Georgann Housekeeper, NP  Clinical History: This is a 71 year old man with acute encephalopathy  Medications: acetaminophen (TYLENOL) tablet 650 mg  ceFEPIme (MAXIPIME) 2 g in dextrose 5 % 50 mL IVPB  chlorhexidine gluconate (MEDLINE KIT) (PERIDEX) 0.12 % solution 15 mL  doxercalciferol (HECTOROL) injection 5 mcg  ferric gluconate (NULECIT) 125 mg in sodium chloride 0.9 % 100 mL IVPB  heparin injection 5,000 Units  ipratropium-albuterol (DUONEB) 0.5-2.5 (3) MG/3ML nebulizer solution 3 mL  labetalol (NORMODYNE,TRANDATE) injection 10 mg  labetalol (NORMODYNE,TRANDATE) injection 5 mg  MEDLINE mouth rinse  norepinephrine (LEVOPHED) 4 mg in dextrose 5 % 250 mL (0.016 mg/mL) infusion  pantoprazole (PROTONIX) injection 40 mg  vancomycin (VANCOCIN) 500 mg in sodium chloride 0.9 % 100 mL IVPB   Technical Summary: A multichannel digital EEG recording measured by the international 10-20 system with electrodes applied with paste and impedances below 5000 ohms performed as portable with EKG monitoring in an intubated and unresponsive patient.  Hyperventilation and photic stimulation were not performed.  The digital EEG was referentially recorded, reformatted, and digitally filtered in a variety of bipolar and referential montages for optimal display.   Description: The patient is intubated and unresponsive during the recording. No sedating medications listed. There is no clear posterior dominant rhythm seen. The background consists of a large amount of diffuse 4-5 Hz theta and 2-3 Hz delta slowing with triphasic waves seen occurring every 1-2 seconds. Normal sleep architecture is not seen. Minimal reactivity to stimulation. Hyperventilation and photic stimulation were not performed. There were no epileptiform discharges or electrographic seizures seen.     EKG lead was unremarkable.  Impression: This EEG is abnormal due to modertte diffuse slowing with triphasic waves.  Clinical Correlation of the above findings indicates diffuse cerebral dysfunction that is non-specific in etiology and can be seen with hypoxic/ischemic injury, toxic/metabolic encephalopathies, neurodegenerative disorders, or medication effect. Triphasic waves are typically seen with hepatic encephalopathy, but can be seen with other metabolic encephalopathies as well. The absence of epileptiform discharges does not rule out a clinical diagnosis of epilepsy.  Clinical correlation is advised.   Ellouise Newer, M.D.

## 2017-11-07 NOTE — Progress Notes (Addendum)
Initial Nutrition Assessment  DOCUMENTATION CODES:   Not applicable  INTERVENTION:    Nepro at 30 ml/h (720 ml per day)  Pro-stat 30 ml BID  Provides 1496 kcal, 88 gm protein, 523 ml free water daily  NUTRITION DIAGNOSIS:   Inadequate oral intake related to inability to eat as evidenced by NPO status.  GOAL:   Patient will meet greater than or equal to 90% of their needs  MONITOR:   Vent status, TF tolerance, I & O's  REASON FOR ASSESSMENT:   Rounds, Consult Enteral/tube feeding initiation and management  ASSESSMENT:   71 yo male with PMH of HTN, ESRD on HD, BPH, blindness, colon polyps, CHF, COPD, cardiac arrest, stroke who was admitted on 11/26 with AMS; during HD he suffered a PEA cardiac arrest, was transferred to the ICU and intubated.  Discussed patient in ICU rounds and with RN today. Patient is anuric.  Lumbar puncture completed in ED, results pending. Per discussion with Dr. Vaughan Browner, RD to order TF today. Patient is currently intubated on ventilator support MV: 8.7 L/min Temp (24hrs), Avg:98.2 F (36.8 C), Min:97.3 F (36.3 C), Max:98.6 F (37 C)   Labs and medications reviewed. Given severe depletion of muscle mass, patient is at increased nutrition risk.  NUTRITION - FOCUSED PHYSICAL EXAM:    Most Recent Value  Orbital Region  No depletion  Upper Arm Region  No depletion  Thoracic and Lumbar Region  Unable to assess  Buccal Region  Unable to assess  Temple Region  Mild depletion  Clavicle Bone Region  Mild depletion  Clavicle and Acromion Bone Region  Mild depletion  Scapular Bone Region  Unable to assess  Dorsal Hand  No depletion  Patellar Region  Severe depletion  Anterior Thigh Region  Severe depletion  Posterior Calf Region  Severe depletion  Edema (RD Assessment)  None  Hair  Reviewed  Eyes  Unable to assess  Mouth  Unable to assess  Skin  Reviewed  Nails  Reviewed       Diet Order:  Diet NPO time specified  EDUCATION  NEEDS:   No education needs have been identified at this time  Skin:  Skin Assessment: Reviewed RN Assessment  Last BM:  11/28  Height:   Ht Readings from Last 1 Encounters:  10/28/2017 5\' 3"  (1.6 m)    Weight:   Wt Readings from Last 1 Encounters:  11/07/17 126 lb 1.7 oz (57.2 kg)    Ideal Body Weight:  56.4 kg  BMI:  Body mass index is 22.34 kg/m.  Estimated Nutritional Needs:   Kcal:  1415  Protein:  80-90 gm  Fluid:  >/= 1.5 L   Molli Barrows, RD, LDN, [place] Pager [phone] After Hours Pager [phone]

## 2017-11-07 NOTE — Progress Notes (Addendum)
[facility] Progress Note  Subjective: had cardiac arrest during HD yest, now in ICU on the vent.  DNR now.   Vitals:   11/07/17 0900 11/07/17 1000 11/07/17 1100 11/07/17 1129  BP: (!) 154/67 (!) 155/61 136/77   Pulse: 78 80 79   Resp: _0 Temp:    98.6 F (37 C)  TempSrc:    Axillary  SpO2: 100% 100% 98%   Weight:      Height:        Inpatient medications: . chlorhexidine gluconate (MEDLINE KIT)  15 mL Mouth Rinse BID  . doxercalciferol  5 mcg Intravenous Q M,W,F-HD  . feeding supplement (PRO-STAT SUGAR FREE 64)  30 mL Oral BID WC  . heparin  5,000 Units Subcutaneous Q8H  . ipratropium-albuterol  3 mL Nebulization TID  . labetalol  5 mg Intravenous Once  . mouth rinse  15 mL Mouth Rinse QID  . pantoprazole sodium  40 mg Per Tube Daily  . sodium chloride flush  3 mL Intravenous Q12H   . sodium chloride    . ceFEPime (MAXIPIME) IV Stopped (11/06/17 0526)  . ferric gluconate (FERRLECIT/NULECIT) IV    . norepinephrine (LEVOPHED) Adult infusion Stopped (11/06/17 2248)  . vancomycin     sodium chloride, acetaminophen **OR** acetaminophen, fentaNYL (SUBLIMAZE) injection, fentaNYL (SUBLIMAZE) injection, labetalol, midazolam, midazolam, sodium chloride flush  Exam: Gen blind AAM, intubated No jvd or bruits Chest clear ant and lat RRR no MRG Abd soft ntnd no mass or ascites +bs MS no joint effusions or deformity Ext trace LE edema / no wounds or ulcers Neuro nonresponsive on the vent LUE AVF+bruit    Dialysis:  [place] MWF 4h  62.5kg  2/2.25  Hep none  LUE AVF -meds pending   Impression: 1. Cardiac arrest - PEA arrest on 11/27, 12 min code. Per CCM.  2. AMS - admission diagnosis 3. VDRF - due to #1 4. ESRD MWF HD 5. Pulm edema - poor film qual today, got 2L off yest 6. Volume - below dry wt, pulm edema by CXR's 7. Anemia of CKD - get records 8. MBD of CKD - get records 9. Blindness 10. Dementia   Plan - HD tomorrow off  schedule   Kelly Splinter MD [facility] pager [phone]   11/07/2017, 11:36 AM   Recent Labs  Lab 11/06/17 1744 11/06/17 2116 11/07/17 0238  NA 141 140 138  K 4.2 4.5 3.9  CL 101 98* 97*  CO2 13* 18* 20*  GLUCOSE 74 109* 107*  BUN 12 22* 26*  CREATININE 5.10* 8.10* 7.70*  CALCIUM 9.0 8.7* 8.4*  PHOS  --  7.9*  --    Recent Labs  Lab 10/21/2017 1224 11/06/17 0338 11/06/17 2116  AST 20 18 719*  ALT 10* 8* 411*  ALKPHOS 59 54 75  BILITOT 1.1 0.6 1.3*  PROT 7.8 7.0 8.0  ALBUMIN 3.4* 3.0* 3.3*   Recent Labs  Lab 11/06/17 0338 11/06/17 2116 11/07/17 0238  WBC 4.1 11.0* 10.8*  HGB 9.0* 10.6* 11.2*  HCT 31.1* 36.3* 36.8*  MCV 100.6* 100.0 96.6  PLT 65* 89* 68*   Iron/TIBC/Ferritin/ %Sat    Component Value Date/Time   IRON 46 12/16/2016 1057   TIBC 176 (L) 12/16/2016 1057   FERRITIN 1,185 (H) 11/18/2015 0955   IRONPCTSAT 26 12/16/2016 1057   IRONPCTSAT 22 05/18/2009 1203

## 2017-11-07 NOTE — Progress Notes (Signed)
PULMONARY / CRITICAL CARE MEDICINE   Name: Cameron Mendez MRN: [id number] DOB: 1946-11-20    ADMISSION DATE:  10/21/2017 CONSULTATION DATE:  11/06/2016   REFERRING MD:  Dr. Wendee Beavers  CHIEF COMPLAINT:  11/06/2017  HISTORY OF PRESENT ILLNESS:   71 year old male with past medical history as below, which is significant for end-stage renal disease on dialysis, COPD, CHF, coronary artery disease, history of cardiac arrest, dementia, blindness, and history of stroke.  He was recently admitted to [facility] with acute encephalopathy which was poorly defined but did seem to improve and he was discharged home with a palliative care referral.  He presented again to [facility] on 11/27 with complaints of altered mental status and is accompanied by his wife.  There was some concern for infection including meningitis so he is covered with Rocephin and also azithromycin with concern for pneumonia.  Lumbar puncture was performed in the emergency department as well.  Due to this acute illness he did miss dialysis and was seen by nephrology as an inpatient.  They felt as though he is volume overloaded and he was transferred to the dialysis unit for HD.  During his dialysis treatment after about 2.5 L was removed he suffered a cardiac arrest and was PEA.  He was resuscitated for a total of 12 minutes.  PCCM has been asked to evaluate.  PAST MEDICAL HISTORY :  He  has a past medical history of Anemia, Arthritis, Blind, BPH (benign prostatic hyperplasia), Cardiac arrest ([place]), CHF (congestive heart failure) ([place]), CKD (chronic kidney disease), stage IV ([place]), Colon polyps, COPD (chronic obstructive pulmonary disease) ([place]), Diastolic dysfunction, Gangrene of finger ([place]), GI (gastrointestinal bleed) (feb 2016), Hypertension, Irregular heart rate (03/03/2015), Pericardial effusion, Retinitis pigmentosa, and Stroke (cerebrum) ([place]).  PAST SURGICAL HISTORY: He  has a past surgical history that includes  Cataract extraction w/ intraocular lens  implant, bilateral (Bilateral); Knee ligament reconstruction (Left); AV fistula placement (07/15/2012); Amputation (12/30/2012); Pericardiocentesis (09/2013); and pericardial tap (N/A, 09/19/2013).  Allergies  Allergen Reactions  . Ibuprofen Other (See Comments)    Kidney problems-dialysis patient  . Nsaids Other (See Comments)    Kidney problems-dialysis patient    No current facility-administered medications on file prior to encounter.    Current Outpatient Medications on File Prior to Encounter  Medication Sig  . acetaminophen (TYLENOL) 500 MG tablet Take 1,000 mg by mouth every 4 (four) hours as needed for mild pain.  [name] allopurinol (ZYLOPRIM) 100 MG tablet Take 100 mg by mouth daily.   [name] amLODipine (NORVASC) 5 MG tablet Take 5 mg by mouth daily.  . carvedilol (COREG) 12.5 MG tablet Take 1 tablet (12.5 mg total) by mouth 2 (two) times daily with a meal.  . diclofenac sodium (VOLTAREN) 1 % GEL Apply topically to back or knees every day as needed for pain  . diltiazem (CARDIZEM CD) 120 MG 24 hr capsule Take 1 capsule (120 mg total) by mouth daily.  [name] doxercalciferol (HECTOROL) 4 MCG/2ML injection Inject 0.66ml into the vein every Monday, Wednesday and Friday with hemodialysis  . feeding supplement (BOOST HIGH PROTEIN) LIQD Take 1 Container by mouth daily.  . ferric gluconate 125 mg in sodium chloride 0.9 % 100 mL Inject 125mg  into the vein every Monday, Wednesday and Friday with hemodialysis  . Amino Acids-Protein Hydrolys (FEEDING SUPPLEMENT, PRO-STAT SUGAR FREE 64,) LIQD Take 30 mLs by mouth 2 (two) times daily with a meal.  . pantoprazole (PROTONIX) 40 MG tablet Take  1 tablet (40 mg total) by mouth daily. Switch for any other PPI at similar dose and frequency (Patient not taking: Reported on 10/15/2017)  . SPIRIVA RESPIMAT 2.5 MCG/ACT AERS INHALE 2 PUFFS INTO THE LUNGS EVERY MORNING (Patient not taking: Reported on 10/15/2017)    FAMILY HISTORY:   His indicated that his mother is deceased. He indicated that his father is deceased. He indicated that the status of his brother is unknown. He indicated that the status of his neg hx is unknown.   SOCIAL HISTORY: He  reports that he has been smoking cigarettes.  He has a 25.00 pack-year smoking history. he has never used smokeless tobacco. He reports that he does not drink alcohol or use drugs.  REVIEW OF SYSTEMS:   unable  SUBJECTIVE:  No acute events overnight Off pressors. BP is stable.   VITAL SIGNS: BP (!) 126/53   Pulse 82   Temp 98.6 F (37 C) (Axillary)   Resp 16   Ht 5\' 3"  (1.6 m)   Wt 126 lb 1.7 oz (57.2 kg)   SpO2 98%   BMI 22.34 kg/m   HEMODYNAMICS:    VENTILATOR SETTINGS: Vent Mode: PRVC FiO2 (%):  [30 %-100 %] 30 % Set Rate:  [16 bmp] 16 bmp Vt Set:  [550 mL] 550 mL PEEP:  [5 cmH20] 5 cmH20 Plateau Pressure:  [11 cmH20-26 cmH20] 22 cmH20  INTAKE / OUTPUT: I/O last 3 completed shifts: In: 368.8 [I.V.:118.8; IV Piggyback:250] Out: 2134 [Other:2134]  PHYSICAL EXAMINATION: Gen:      No acute distress, frail appearing. HEENT:  EOMI, sclera anicteric, ETT in place Neck:     No masses; no thyromegaly Lungs:    Clear to auscultation bilaterally; normal respiratory effort CV:         Regular rate and rhythm; no murmurs Abd:      + bowel sounds; soft, non-tender; no palpable masses, no distension Ext:    No edema; adequate peripheral perfusion Skin:      Warm and dry; no rash Neuro: Sedated, unresponsive  LABS:  BMET Recent Labs  Lab 11/06/17 1744 11/06/17 2116 11/07/17 0238  NA 141 140 138  K 4.2 4.5 3.9  CL 101 98* 97*  CO2 13* 18* 20*  BUN 12 22* 26*  CREATININE 5.10* 8.10* 7.70*  GLUCOSE 74 109* 107*    Electrolytes Recent Labs  Lab 11/06/17 1744 11/06/17 2116 11/07/17 0238  CALCIUM 9.0 8.7* 8.4*  MG 3.2* 3.0*  --   PHOS  --  7.9*  --     CBC Recent Labs  Lab 11/06/17 0338 11/06/17 2116 11/07/17 0238  WBC 4.1 11.0* 10.8*   HGB 9.0* 10.6* 11.2*  HCT 31.1* 36.3* 36.8*  PLT 65* 89* 68*    Coag's No results for input(s): APTT, INR in the last 168 hours.  Sepsis Markers Recent Labs  Lab 11/06/17 0609 11/06/17 1808 11/06/17 2116 11/07/17 0238  LATICACIDVEN 1.2 20.4* 12.1*  --   PROCALCITON  --   --  6.32 65.79    ABG Recent Labs  Lab 11/06/17 1848  PHART 7.238*  PCO2ART 31.8*  PO2ART 371.0*    Liver Enzymes Recent Labs  Lab 11/01/2017 1224 11/06/17 0338 11/06/17 2116  AST 20 18 719*  ALT 10* 8* 411*  ALKPHOS 59 54 75  BILITOT 1.1 0.6 1.3*  ALBUMIN 3.4* 3.0* 3.3*    Cardiac Enzymes Recent Labs  Lab 11/06/17 2116 11/07/17 0238 11/07/17 0715  TROPONINI 0.11* 0.26* 0.31*  Glucose Recent Labs  Lab 11/06/17 2049 11/06/17 2052 11/06/17 2349 11/07/17 0423 11/07/17 0843 11/07/17 1126  GLUCAP 25* 80 124* 111* 96 3    Imaging Mr Brain Wo Contrast  Result Date: 11/06/2017 CLINICAL DATA:  71 y/o  M; patient coded during dialysis. EXAM: MRI HEAD WITHOUT CONTRAST TECHNIQUE: Multiplanar, multiecho pulse sequences of the brain and surrounding structures were obtained without intravenous contrast. COMPARISON:  10/25/2017 CT of the head. FINDINGS: Brain: No acute infarction, hemorrhage, hydrocephalus, extra-axial collection or mass lesion. Diffuse nonspecific T2 FLAIR hyperintense signal abnormality of white matter compatible with severe chronic microvascular ischemic changes. Severe brain parenchymal volume loss and diffuse prominent perivascular spaces. Small hemosiderin stained chronic lacunar infarctions within the caudate heads bilaterally. Stable foci of chronic microhemorrhage within the left putamen and right splenium of corpus callosum. Right parietal approach ventriculostomy catheter with tip in the right lateral ventricle. No hydrocephalus. Vascular: Loss of normal flow void in the right transverse sinus. Skull and upper cervical spine: Normal marrow signal. Sinuses/Orbits: Fluid  and debris within the oropharynx. Multiple mucous retention cysts within the maxillary sinuses bilaterally and mild diffuse paranasal sinus mucosal thickening. No abnormal signal of mastoid air cells. Bilateral intra-ocular lens replacement. Other: None. IMPRESSION: 1. No acute/early subacute infarct, hemorrhage, or focal mass effect. 2. Severe chronic microvascular ischemic changes and parenchymal volume loss of the brain. Small chronic lacunar infarcts in the basal ganglia bilaterally. 3. Right parietal approach ventriculostomy catheter. No hydrocephalus. 4. Mild paranasal sinus disease. 5. Loss of normal flow void in right transverse sinus, probably slow flow, less likely thrombosis. This can be further assessed with CT or MRI venogram of the head. These results will be called to the ordering clinician or representative by the Radiologist Assistant, and communication documented in the PACS or zVision Dashboard. Electronically Signed   By: Kristine Garbe M.D.   On: 11/06/2017 22:41   Dg Chest Port 1 View  Result Date: 11/07/2017 CLINICAL DATA:  72 year old male with fever of unknown origin. Cough and altered mental status. EXAM: PORTABLE CHEST 1 VIEW COMPARISON:  11/06/2017 CT, radiographs, and earlier. FINDINGS: Portable AP semi upright view at 0427 hours. Endotracheal tube tip in good position between the clavicles and carina. Enteric tube courses to the abdomen, tip not included. Right neck, chest and abdomen shunt catheter re- demonstrated. Stable cardiomegaly and mediastinal contours. No pneumothorax. Regressed pulmonary vascularity since 11/03/2017. Patchy right lung base consolidation and pleural effusions are not evident today. Mildly improved perihilar ventilation since 11/06/2017. No areas of worsening ventilation. Paucity of upper abdominal bowel gas. IMPRESSION: 1.  Stable lines and tubes. 2. Regressed pulmonary edema since 10/29/2017. Right lower lobe consolidation and left greater than  right pleural effusions seen by CT yesterday are not evident. No areas of worsening ventilation. Electronically Signed   By: Genevie Ann M.D.   On: 11/07/2017 06:51   Dg Chest Port 1 View  Result Date: 11/06/2017 CLINICAL DATA:  Intubation. EXAM: PORTABLE CHEST 1 VIEW COMPARISON:  11/06/2017 chest CT and 10/21/2017 chest radiograph FINDINGS: An endotracheal tube with tip 2.5 cm above the carina and NG tube with tip overlying the mid stomach noted. Cardiomegaly with pulmonary vascular congestion again identified. Bibasilar atelectasis, right greater than left noted. The left pleural effusion identified on today's CT is difficult to visualize radiographically. There is no evidence of pneumothorax. Ventriculoperitoneal shunt catheters are again noted. IMPRESSION: Endotracheal tube with tip 2.5 cm above the carina and NG tube with tip overlying the mid stomach.  Cardiomegaly, pulmonary vascular congestion and bibasilar atelectasis, right greater than left. Electronically Signed   By: Margarette Canada M.D.   On: 11/06/2017 19:38   Dg Abd Portable 1v  Result Date: 11/06/2017 CLINICAL DATA:  NG tube placement. EXAM: PORTABLE ABDOMEN - 1 VIEW COMPARISON:  None. FINDINGS: An NG tube is identified with tip overlying the mid stomach. VP shunt catheter again noted. Bowel gas pattern is unremarkable. IMPRESSION: NG tube with tip overlying the mid stomach. Electronically Signed   By: Margarette Canada M.D.   On: 11/06/2017 19:38    STUDIES:  LP 11/27 > clear, gluc 59, RBC 1, color yellow, xanthochromic, protein 276, WBC 4. CT chest 11/27 > Consolidations in the dependent right lower lobe and perifissural left upper lobe and lingula. Cardiomegaly with mild septal thickening/ pulmonary edema. Ground-glass opacities in the left lung and right upper lobe, likely alveolar pulmonary edema, less likely infectious pneumonitis. Small bilateral pleural effusions. Aortic atherosclerosis coronary artery calcifications. Mild emphysema. CT  head 11/27 > No acute intracranial abnormality. Unchanged size and configuration of the shunted ventricles. Severe atrophy and white matter disease consistent with chronic ischemic microangiopathy.  CULTURES: CSF 11/27 > Blood 11/27 >  ANTIBIOTICS: Cefepime 11/27 >  CTX 11/27 > 11/28 Azithro 11/27 > 11/28 Vanco 11/27 >  SIGNIFICANT EVENTS: 11/27 admit  LINES/TUBES: 11/27 ETT >  DISCUSSION: 71 year old male with end-stage renal disease, COPD, CHF, coronary artery disease, dementia admitted with acute encephalopathy, sepsis.  Had a cardiac arrest during dialysis.  ASSESSMENT / PLAN:  PULMONARY A: Acute respiratory failure secondary to cardiac arrest COPD without acute exacerbation (has send RB in past 2016) ? Pulmonary edema now s/p HD with 2L removed  P:   Continue full vent support Follow chest x-ray Duo nebs SBTs when mental status improves  CARDIOVASCULAR A: Cardiac arrest PEA 12 mins Acute on chronic diastolic CHF Hx HTN  P:  Off pressors.  DNR if he arrests  RENAL A:   ESRD on HD  P:   Nephrology is following Plan for HD tomorrow  GASTROINTESTINAL A:   GERD Hx GIB  P:   PPI for SUP  HEMATOLOGIC A:   Thrombocytopenia  Anemia  P:  SQ heparin Follow CBC Transfuse per ICU protocol  INFECTIOUS A:   Likely HCAP Also concern for meningitis on presentation but CSF is not diagnostic   P:   Continue ABX as above Follow cx  ENDOCRINE A:   No acute issues  P:   Follow glucose on chemistry  NEUROLOGIC A:   Acute metabolic/anoxic encephalopathy Dementia Hx stroke Blind P:   RASS goal: 0 PRN fent/versed for RASS goal  FAMILY  - Updates: wife updated, understands severity of chronic illness and acute situation as well. DNR. No escalation. No procedures.  - Inter-disciplinary family meet or Palliative Care meeting due by:  12/3  The patient is critically ill with multiple organ system failure and requires high complexity decision  making for assessment and support, frequent evaluation and titration of therapies, advanced monitoring, review of radiographic studies and interpretation of complex data.   Critical Care Time devoted to patient care services, exclusive of separately billable procedures, described in this note is 35 minutes.   Marshell Garfinkel MD [facility] Pager [phone] If no answer or after 3pm call: [phone] 11/07/2017, 12:20 PM

## 2017-11-08 ENCOUNTER — Inpatient Hospital Stay (HOSPITAL_COMMUNITY): Payer: Medicare Other

## 2017-11-08 DIAGNOSIS — I469 Cardiac arrest, cause unspecified: Secondary | ICD-10-CM

## 2017-11-08 DIAGNOSIS — I361 Nonrheumatic tricuspid (valve) insufficiency: Secondary | ICD-10-CM

## 2017-11-08 LAB — COMPREHENSIVE METABOLIC PANEL
ALBUMIN: 2.5 g/dL — AB (ref 3.5–5.0)
ALT: 228 U/L — ABNORMAL HIGH (ref 17–63)
AST: 499 U/L — AB (ref 15–41)
Alkaline Phosphatase: 66 U/L (ref 38–126)
Anion gap: 14 (ref 5–15)
BUN: 69 mg/dL — AB (ref 6–20)
CHLORIDE: 103 mmol/L (ref 101–111)
CO2: 23 mmol/L (ref 22–32)
Calcium: 8.8 mg/dL — ABNORMAL LOW (ref 8.9–10.3)
Creatinine, Ser: 10.75 mg/dL — ABNORMAL HIGH (ref 0.61–1.24)
GFR calc Af Amer: 5 mL/min — ABNORMAL LOW (ref >60)
GFR calc non Af Amer: 4 mL/min — ABNORMAL LOW (ref >60)
GLUCOSE: 115 mg/dL — AB (ref 65–99)
POTASSIUM: 3.5 mmol/L (ref 3.5–5.1)
Sodium: 140 mmol/L (ref 135–145)
Total Bilirubin: 0.8 mg/dL (ref 0.3–1.2)
Total Protein: 6.9 g/dL (ref 6.5–8.1)

## 2017-11-08 LAB — GLUCOSE, CAPILLARY
GLUCOSE-CAPILLARY: 112 mg/dL — AB (ref 65–99)
GLUCOSE-CAPILLARY: 81 mg/dL (ref 65–99)
Glucose-Capillary: 105 mg/dL — ABNORMAL HIGH (ref 65–99)
Glucose-Capillary: 107 mg/dL — ABNORMAL HIGH (ref 65–99)
Glucose-Capillary: 120 mg/dL — ABNORMAL HIGH (ref 65–99)
Glucose-Capillary: 123 mg/dL — ABNORMAL HIGH (ref 65–99)
Glucose-Capillary: 75 mg/dL (ref 65–99)
Glucose-Capillary: 88 mg/dL (ref 65–99)

## 2017-11-08 LAB — CBC
HCT: 32.7 % — ABNORMAL LOW (ref 39.0–52.0)
HEMOGLOBIN: 10 g/dL — AB (ref 13.0–17.0)
MCH: 28.5 pg (ref 26.0–34.0)
MCHC: 30.6 g/dL (ref 30.0–36.0)
MCV: 93.2 fL (ref 78.0–100.0)
PLATELETS: 67 10*3/uL — AB (ref 150–400)
RBC: 3.51 MIL/uL — AB (ref 4.22–5.81)
RDW: 19.8 % — ABNORMAL HIGH (ref 11.5–15.5)
WBC: 9.5 10*3/uL (ref 4.0–10.5)

## 2017-11-08 LAB — BASIC METABOLIC PANEL
Anion gap: 17 — ABNORMAL HIGH (ref 5–15)
BUN: 57 mg/dL — AB (ref 6–20)
CO2: 21 mmol/L — ABNORMAL LOW (ref 22–32)
CREATININE: 9.72 mg/dL — AB (ref 0.61–1.24)
Calcium: 8.4 mg/dL — ABNORMAL LOW (ref 8.9–10.3)
Chloride: 100 mmol/L — ABNORMAL LOW (ref 101–111)
GFR, EST AFRICAN AMERICAN: 5 mL/min — AB (ref >60)
GFR, EST NON AFRICAN AMERICAN: 5 mL/min — AB (ref >60)
Glucose, Bld: 113 mg/dL — ABNORMAL HIGH (ref 65–99)
POTASSIUM: 3.5 mmol/L (ref 3.5–5.1)
SODIUM: 138 mmol/L (ref 135–145)

## 2017-11-08 LAB — ECHOCARDIOGRAM COMPLETE
HEIGHTINCHES: 63 in
WEIGHTICAEL: 2024.7 [oz_av]

## 2017-11-08 LAB — PHOSPHORUS: PHOSPHORUS: 6.9 mg/dL — AB (ref 2.5–4.6)

## 2017-11-08 LAB — MAGNESIUM: MAGNESIUM: 2.9 mg/dL — AB (ref 1.7–2.4)

## 2017-11-08 LAB — AMMONIA: Ammonia: 39 umol/L — ABNORMAL HIGH (ref 9–35)

## 2017-11-08 LAB — LACTIC ACID, PLASMA: Lactic Acid, Venous: 2.6 mmol/L (ref 0.5–1.9)

## 2017-11-08 LAB — TROPONIN I: Troponin I: 0.27 ng/mL (ref <0.03)

## 2017-11-08 LAB — PROCALCITONIN

## 2017-11-08 NOTE — Progress Notes (Addendum)
PULMONARY / CRITICAL CARE MEDICINE   Name: Cameron Mendez MRN: [id number] DOB: January 27, 1946    ADMISSION DATE:  10/24/2017 CONSULTATION DATE:  11/06/2016   REFERRING MD:  Dr. Wendee Beavers  CHIEF COMPLAINT:  11/06/2017  HISTORY OF PRESENT ILLNESS:   71 year old male with past medical history as below, which is significant for end-stage renal disease on dialysis, COPD, CHF, coronary artery disease, history of cardiac arrest, dementia, blindness, and history of stroke.  He was recently admitted to [facility] with acute encephalopathy which was poorly defined but did seem to improve and he was discharged home with a palliative care referral.  He presented again to [facility] on 11/27 with complaints of altered mental status and is accompanied by his wife.  There was some concern for infection including meningitis so he is covered with Rocephin and also azithromycin with concern for pneumonia.  Lumbar puncture was performed in the emergency department as well.  Due to this acute illness he did miss dialysis and was seen by nephrology as an inpatient.  They felt as though he is volume overloaded and he was transferred to the dialysis unit for HD.  During his dialysis treatment after about 2.5 L was removed he suffered a cardiac arrest and was PEA.  He was resuscitated for a total of 12 minutes.  PCCM has been asked to evaluate.  PAST MEDICAL HISTORY :  He  has a past medical history of Anemia, Arthritis, Blind, BPH (benign prostatic hyperplasia), Cardiac arrest ([place]), CHF (congestive heart failure) ([place]), CKD (chronic kidney disease), stage IV ([place]), Colon polyps, COPD (chronic obstructive pulmonary disease) ([place]), Diastolic dysfunction, Gangrene of finger ([place]), GI (gastrointestinal bleed) (feb 2016), Hypertension, Irregular heart rate (03/03/2015), Pericardial effusion, Retinitis pigmentosa, and Stroke (cerebrum) ([place]).  PAST SURGICAL HISTORY: He  has a past surgical history that includes  Cataract extraction w/ intraocular lens  implant, bilateral (Bilateral); Knee ligament reconstruction (Left); AV fistula placement (07/15/2012); Amputation (12/30/2012); Pericardiocentesis (09/2013); and pericardial tap (N/A, 09/19/2013).  Allergies  Allergen Reactions  . Ibuprofen Other (See Comments)    Kidney problems-dialysis patient  . Nsaids Other (See Comments)    Kidney problems-dialysis patient    No current facility-administered medications on file prior to encounter.    Current Outpatient Medications on File Prior to Encounter  Medication Sig  . acetaminophen (TYLENOL) 500 MG tablet Take 1,000 mg by mouth every 4 (four) hours as needed for mild pain.  [name] allopurinol (ZYLOPRIM) 100 MG tablet Take 100 mg by mouth daily.   [name] amLODipine (NORVASC) 5 MG tablet Take 5 mg by mouth daily.  . carvedilol (COREG) 12.5 MG tablet Take 1 tablet (12.5 mg total) by mouth 2 (two) times daily with a meal.  . diclofenac sodium (VOLTAREN) 1 % GEL Apply topically to back or knees every day as needed for pain  . diltiazem (CARDIZEM CD) 120 MG 24 hr capsule Take 1 capsule (120 mg total) by mouth daily.  [name] doxercalciferol (HECTOROL) 4 MCG/2ML injection Inject 0.53ml into the vein every Monday, Wednesday and Friday with hemodialysis  . feeding supplement (BOOST HIGH PROTEIN) LIQD Take 1 Container by mouth daily.  . ferric gluconate 125 mg in sodium chloride 0.9 % 100 mL Inject 125mg  into the vein every Monday, Wednesday and Friday with hemodialysis  . Amino Acids-Protein Hydrolys (FEEDING SUPPLEMENT, PRO-STAT SUGAR FREE 64,) LIQD Take 30 mLs by mouth 2 (two) times daily with a meal.  . pantoprazole (PROTONIX) 40 MG tablet Take  1 tablet (40 mg total) by mouth daily. Switch for any other PPI at similar dose and frequency (Patient not taking: Reported on 10/15/2017)  . SPIRIVA RESPIMAT 2.5 MCG/ACT AERS INHALE 2 PUFFS INTO THE LUNGS EVERY MORNING (Patient not taking: Reported on 10/15/2017)    FAMILY HISTORY:   His indicated that his mother is deceased. He indicated that his father is deceased. He indicated that the status of his brother is unknown. He indicated that the status of his neg hx is unknown.   SOCIAL HISTORY: He  reports that he has been smoking cigarettes.  He has a 25.00 pack-year smoking history. he has never used smokeless tobacco. He reports that he does not drink alcohol or use drugs.  REVIEW OF SYSTEMS:   unable  SUBJECTIVE:  No acute events Continues off pressors.  VITAL SIGNS: BP 140/60 (BP Location: Right Leg)   Pulse 79   Temp 99 F (37.2 C) (Oral)   Resp 16   Ht 5\' 3"  (1.6 m)   Wt 126 lb 8.7 oz (57.4 kg)   SpO2 99%   BMI 22.42 kg/m   HEMODYNAMICS:    VENTILATOR SETTINGS: Vent Mode: PRVC FiO2 (%):  [30 %] 30 % Set Rate:  [16 bmp] 16 bmp Vt Set:  [550 mL] 550 mL PEEP:  [5 cmH20] 5 cmH20 Plateau Pressure:  [22 cmH20-25 cmH20] 24 cmH20  INTAKE / OUTPUT: I/O last 3 completed shifts: In: 682 [I.V.:238.8; NG/GT:393.3; IV Piggyback:50] Out: 0   PHYSICAL EXAMINATION: Gen:      No acute distress, frail appearing HEENT:  EOMI, sclera anicteric Neck:     No masses; no thyromegaly Lungs:    Clear to auscultation bilaterally; normal respiratory effort CV:         Regular rate and rhythm; no murmurs Abd:      + bowel sounds; soft, non-tender; no palpable masses, no distension Ext:    No edema; adequate peripheral perfusion Skin:      Warm and dry; no rash Neuro: Sedated, unresponsive  LABS:  BMET Recent Labs  Lab 11/06/17 2116 11/07/17 0238 11/08/17 0708  NA 140 138 138  K 4.5 3.9 3.5  CL 98* 97* 100*  CO2 18* 20* 21*  BUN 22* 26* 57*  CREATININE 8.10* 7.70* 9.72*  GLUCOSE 109* 107* 113*    Electrolytes Recent Labs  Lab 11/06/17 1744 11/06/17 2116 11/07/17 0238 11/08/17 0708  CALCIUM 9.0 8.7* 8.4* 8.4*  MG 3.2* 3.0*  --  2.9*  PHOS  --  7.9*  --  6.9*    CBC Recent Labs  Lab 11/06/17 2116 11/07/17 0238 11/08/17 0708  WBC 11.0*  10.8* 9.5  HGB 10.6* 11.2* 10.0*  HCT 36.3* 36.8* 32.7*  PLT 89* 68* 67*    Coag's No results for input(s): APTT, INR in the last 168 hours.  Sepsis Markers Recent Labs  Lab 11/06/17 0609 11/06/17 1808 11/06/17 2116 11/07/17 0238 11/08/17 0708  LATICACIDVEN 1.2 20.4* 12.1*  --   --   PROCALCITON  --   --  6.32 65.79 >150.00    ABG Recent Labs  Lab 11/06/17 1848  PHART 7.238*  PCO2ART 31.8*  PO2ART 371.0*    Liver Enzymes Recent Labs  Lab 10/31/2017 1224 11/06/17 0338 11/06/17 2116  AST 20 18 719*  ALT 10* 8* 411*  ALKPHOS 59 54 75  BILITOT 1.1 0.6 1.3*  ALBUMIN 3.4* 3.0* 3.3*    Cardiac Enzymes Recent Labs  Lab 11/06/17 2116 11/07/17 0238 11/07/17 0715  TROPONINI 0.11* 0.26* 0.31*    Glucose Recent Labs  Lab 11/07/17 1126 11/07/17 1627 11/07/17 2010 11/08/17 0005 11/08/17 0402 11/08/17 0824  GLUCAP 81 45* 74 123* 88 112*    Imaging Dg Chest Port 1 View  Result Date: 11/08/2017 CLINICAL DATA:  Acute respiratory failure EXAM: PORTABLE CHEST 1 VIEW COMPARISON:  11/07/2017 FINDINGS: Endotracheal tube in good position. Gastric tube enters the stomach with the tip not visualized. VP shunt tubing overlying the right chest unchanged Right lower lobe airspace disease with mild progression. Possible pneumonia or atelectasis. Negative for heart failure or effusion IMPRESSION: Endotracheal tube remains in good position Mild progression of right lower lobe atelectasis/ pneumonia. Electronically Signed   By: Franchot Gallo M.D.   On: 11/08/2017 06:57    STUDIES:  LP 11/27 > clear, gluc 59, RBC 1, color yellow, xanthochromic, protein 276, WBC 4. CT chest 11/27 > Consolidations in the dependent right lower lobe and perifissural left upper lobe and lingula. Cardiomegaly with mild septal thickening/ pulmonary edema. Ground-glass opacities in the left lung and right upper lobe, likely alveolar pulmonary edema, less likely infectious pneumonitis. Small bilateral  pleural effusions. Aortic atherosclerosis coronary artery calcifications. Mild emphysema. CT head 11/27 > No acute intracranial abnormality. Unchanged size and configuration of the shunted ventricles. Severe atrophy and white matter disease consistent with chronic ischemic microangiopathy.  CULTURES: CSF 11/27 > Blood 11/27 >  ANTIBIOTICS: Cefepime 11/27 >  CTX 11/27 > 11/28 Azithro 11/27 > 11/28 Vanco 11/27 >  SIGNIFICANT EVENTS: 11/27 admit  LINES/TUBES: 11/27 ETT >  DISCUSSION: 71 year old male with end-stage renal disease, COPD, CHF, coronary artery disease, dementia admitted with acute encephalopathy, sepsis.  Had a cardiac arrest during dialysis.  ASSESSMENT / PLAN:  PULMONARY A: Acute respiratory failure secondary to cardiac arrest COPD without acute exacerbation (has send RB in past 2016) ? Pulmonary edema now s/p HD with 2L removed  P:   Start PSV weans Follow chest x-ray Duo nebs  CARDIOVASCULAR A: Cardiac arrest PEA 12 mins Acute on chronic diastolic CHF Hx HTN  P:  Off pressors.  DNR if he arrests Follow troponin, echo  RENAL A:   ESRD on HD  P:   Nephrology is following HD today.  GASTROINTESTINAL A:   GERD Hx GIB Elevated LFTs. ? Shock liver from cardiac arrest P:   PPI for SUP Repeat LFTs, ammonia levels.   HEMATOLOGIC A:   Thrombocytopenia  Anemia  P:  SQ heparin Follow CBC Transfuse per ICU protocol  INFECTIOUS A:   Likely HCAP Also concern for meningitis on presentation but CSF is not diagnostic   P:   Continue ABX as above Follow cx  ENDOCRINE A:   No acute issues  P:   Follow glucose on chemistry  NEUROLOGIC A:   Acute metabolic/anoxic encephalopathy. Concern for anoxic injury Dementia Hx stroke Blind EEG noted for diffuse slowing. No seizures P:   RASS goal: 0 Limit sedation Monitor mental status.   FAMILY  - Updates: wife updated, understands severity of chronic illness and acute situation as  well. DNR. No escalation. No procedures.  - Inter-disciplinary family meet or Palliative Care meeting due by:  12/3  The patient is critically ill with multiple organ system failure and requires high complexity decision making for assessment and support, frequent evaluation and titration of therapies, advanced monitoring, review of radiographic studies and interpretation of complex data.   Critical Care Time devoted to patient care services, exclusive of separately billable procedures,  described in this note is 35 minutes.   Marshell Garfinkel MD [facility] Pager [phone] If no answer or after 3pm call: [phone] 11/08/2017, 10:19 AM

## 2017-11-08 NOTE — Progress Notes (Signed)
*  PRELIMINARY RESULTS* Echocardiogram 2D Echocardiogram has been performed.  Leavy Cella 11/08/2017, 12:14 PM

## 2017-11-08 NOTE — Care Management Note (Signed)
Case Management Note  Patient Details  Name: [name] MRN: [id number] Date of Birth: 1946/08/27  Subjective/Objective:   Pt admitted with AMS, received HD at [facility] and suffered Cardiac Arrest - resuscitated for a total of 12 mins  Action/Plan:  PTA from home with spouse.  Active with [facility] for HHPT. Poor prognosis, possible anoxic brain injury , underlying blindness and dementia/ ESRD. Palliative Care consulted      Expected Discharge Date:                  Expected Discharge Plan:     In-House Referral:     Discharge planning Services     Post Acute Care Choice:    Choice offered to:     DME Arranged:    DME Agency:     HH Arranged:    HH Agency:     Status of Service:     If discussed at [employer], dates discussed:    Additional Comments:  Maryclare Labrador, RN 11/08/2017, 6:03 PM

## 2017-11-08 NOTE — Progress Notes (Signed)
[facility] Progress Note  Subjective: 2L off w HD yesterday. CXR cleared up today. Pt not responding.   Vitals:   11/08/17 1022 11/08/17 1023 11/08/17 1111 11/08/17 1159  BP: 138/76     Pulse: 82  91   Resp: 16  19   Temp:    98.1 F (36.7 C)  TempSrc:    Oral  SpO2: 99% 99% 99%   Weight:      Height:        Inpatient medications: . chlorhexidine gluconate (MEDLINE KIT)  15 mL Mouth Rinse BID  . doxercalciferol  5 mcg Intravenous Q M,W,F-HD  . feeding supplement (NEPRO CARB STEADY)  1,000 mL Per Tube Q24H  . feeding supplement (PRO-STAT SUGAR FREE 64)  30 mL Per Tube BID  . heparin  5,000 Units Subcutaneous Q8H  . ipratropium-albuterol  3 mL Nebulization TID  . labetalol  5 mg Intravenous Once  . mouth rinse  15 mL Mouth Rinse QID  . pantoprazole sodium  40 mg Per Tube Daily  . sodium chloride flush  3 mL Intravenous Q12H   . sodium chloride    . ceFEPime (MAXIPIME) IV 2 g (11/07/17 1811)  . ferric gluconate (FERRLECIT/NULECIT) IV    . norepinephrine (LEVOPHED) Adult infusion Stopped (11/06/17 2248)  . vancomycin     sodium chloride, acetaminophen **OR** acetaminophen, fentaNYL (SUBLIMAZE) injection, fentaNYL (SUBLIMAZE) injection, labetalol, midazolam, midazolam, sodium chloride flush  Exam: Gen blind AAM, intubated No jvd or bruits Chest clear ant and lat RRR no MRG Abd soft ntnd no mass or ascites +bs MS no joint effusions or deformity Ext trace LE edema / no wounds or ulcers Neuro nonresponsive on the vent LUE AVF+bruit    Dialysis:  [place] MWF 4h  62.5kg  2/2.25  Hep none  LUE AVF -meds pending   Impression: 1. Cardiac arrest - PEA arrest on 11/27, 12 min code.  2. AMS - not waking up on vent, poss anoxic brain injury 3. VDRF - due to #1. pulm edema resolved  4. ESRD MWF HD 5. Anemia of CKD - get records 6. MBD of CKD - get records 7. Blindness 8. Dementia   Plan - HD tonight, off schedule. Poor prognosis, possible anoxic  brain injury , underlying blindness and dementia/ ESRD. Given severe comorbidities, will consult palliative care to get involved now.      Kelly Splinter MD [facility] pager [phone]   11/08/2017, 1:14 PM   Recent Labs  Lab 11/06/17 2116 11/07/17 0238 11/08/17 0708  NA 140 138 138  K 4.5 3.9 3.5  CL 98* 97* 100*  CO2 18* 20* 21*  GLUCOSE 109* 107* 113*  BUN 22* 26* 57*  CREATININE 8.10* 7.70* 9.72*  CALCIUM 8.7* 8.4* 8.4*  PHOS 7.9*  --  6.9*   Recent Labs  Lab 11/02/2017 1224 11/06/17 0338 11/06/17 2116  AST 20 18 719*  ALT 10* 8* 411*  ALKPHOS 59 54 75  BILITOT 1.1 0.6 1.3*  PROT 7.8 7.0 8.0  ALBUMIN 3.4* 3.0* 3.3*   Recent Labs  Lab 11/06/17 2116 11/07/17 0238 11/08/17 0708  WBC 11.0* 10.8* 9.5  HGB 10.6* 11.2* 10.0*  HCT 36.3* 36.8* 32.7*  MCV 100.0 96.6 93.2  PLT 89* 68* 67*   Iron/TIBC/Ferritin/ %Sat    Component Value Date/Time   IRON 46 12/16/2016 1057   TIBC 176 (L) 12/16/2016 1057   FERRITIN 1,185 (H) 11/18/2015 0955   IRONPCTSAT 26 12/16/2016 1057   IRONPCTSAT  22 05/18/2009 1203     

## 2017-11-09 ENCOUNTER — Inpatient Hospital Stay (HOSPITAL_COMMUNITY): Payer: Medicare Other

## 2017-11-09 LAB — COMPREHENSIVE METABOLIC PANEL
ALT: 181 U/L — ABNORMAL HIGH (ref 17–63)
ANION GAP: 16 — AB (ref 5–15)
AST: 378 U/L — ABNORMAL HIGH (ref 15–41)
Albumin: 2.4 g/dL — ABNORMAL LOW (ref 3.5–5.0)
Alkaline Phosphatase: 62 U/L (ref 38–126)
BUN: 86 mg/dL — ABNORMAL HIGH (ref 6–20)
CHLORIDE: 101 mmol/L (ref 101–111)
CO2: 22 mmol/L (ref 22–32)
Calcium: 8.5 mg/dL — ABNORMAL LOW (ref 8.9–10.3)
Creatinine, Ser: 11.68 mg/dL — ABNORMAL HIGH (ref 0.61–1.24)
GFR, EST AFRICAN AMERICAN: 4 mL/min — AB (ref >60)
GFR, EST NON AFRICAN AMERICAN: 4 mL/min — AB (ref >60)
Glucose, Bld: 116 mg/dL — ABNORMAL HIGH (ref 65–99)
Potassium: 3.2 mmol/L — ABNORMAL LOW (ref 3.5–5.1)
SODIUM: 139 mmol/L (ref 135–145)
Total Bilirubin: 0.7 mg/dL (ref 0.3–1.2)
Total Protein: 6.8 g/dL (ref 6.5–8.1)

## 2017-11-09 LAB — CSF CULTURE: CULTURE: NO GROWTH

## 2017-11-09 LAB — GLUCOSE, CAPILLARY
GLUCOSE-CAPILLARY: 110 mg/dL — AB (ref 65–99)
GLUCOSE-CAPILLARY: 108 mg/dL — AB (ref 65–99)
Glucose-Capillary: 99 mg/dL (ref 65–99)
Glucose-Capillary: 104 mg/dL — ABNORMAL HIGH (ref 65–99)
Glucose-Capillary: 90 mg/dL (ref 65–99)

## 2017-11-09 LAB — BLOOD GAS, ARTERIAL
ACID-BASE DEFICIT: 0.5 mmol/L (ref 0.0–2.0)
Bicarbonate: 22.5 mmol/L (ref 20.0–28.0)
DRAWN BY: 511911
FIO2: 30
LHR: 16 {breaths}/min
MECHVT: 550 mL
O2 SAT: 98.9 %
PATIENT TEMPERATURE: 98.9
PCO2 ART: 30 mmHg — AB (ref 32.0–48.0)
PEEP/CPAP: 5 cmH2O
PH ART: 7.489 — AB (ref 7.350–7.450)
PO2 ART: 112 mmHg — AB (ref 83.0–108.0)

## 2017-11-09 LAB — CSF CULTURE W GRAM STAIN

## 2017-11-09 LAB — CBC
HEMATOCRIT: 28.7 % — AB (ref 39.0–52.0)
HEMOGLOBIN: 8.6 g/dL — AB (ref 13.0–17.0)
MCH: 28 pg (ref 26.0–34.0)
MCHC: 30 g/dL (ref 30.0–36.0)
MCV: 93.5 fL (ref 78.0–100.0)
Platelets: 69 10*3/uL — ABNORMAL LOW (ref 150–400)
RBC: 3.07 MIL/uL — ABNORMAL LOW (ref 4.22–5.81)
RDW: 19.5 % — ABNORMAL HIGH (ref 11.5–15.5)
WBC: 5.6 10*3/uL (ref 4.0–10.5)

## 2017-11-09 LAB — PHOSPHORUS: Phosphorus: 6.6 mg/dL — ABNORMAL HIGH (ref 2.5–4.6)

## 2017-11-09 LAB — MAGNESIUM: MAGNESIUM: 2.9 mg/dL — AB (ref 1.7–2.4)

## 2017-11-09 MED ORDER — DEXTROSE 5 % IV SOLN
500.0000 mg | Freq: Once | INTRAVENOUS | Status: AC
  Administered 2017-11-09: 500 mg via INTRAVENOUS
  Filled 2017-11-09: qty 0.5

## 2017-11-09 MED ORDER — CARVEDILOL 12.5 MG PO TABS
12.5000 mg | ORAL_TABLET | Freq: Two times a day (BID) | ORAL | Status: DC
  Administered 2017-11-09 – 2017-11-12 (×6): 12.5 mg via ORAL
  Filled 2017-11-09 (×8): qty 1

## 2017-11-09 MED ORDER — POTASSIUM CHLORIDE 20 MEQ/15ML (10%) PO SOLN
40.0000 meq | Freq: Once | ORAL | Status: AC
  Administered 2017-11-09: 40 meq
  Filled 2017-11-09: qty 30

## 2017-11-09 MED ORDER — ONDANSETRON HCL 4 MG/2ML IJ SOLN
4.0000 mg | Freq: Once | INTRAMUSCULAR | Status: AC
  Administered 2017-11-09: 4 mg via INTRAVENOUS
  Filled 2017-11-09: qty 2

## 2017-11-09 MED ORDER — AMLODIPINE BESYLATE 5 MG PO TABS
5.0000 mg | ORAL_TABLET | Freq: Every day | ORAL | Status: DC
  Administered 2017-11-09 – 2017-11-12 (×3): 5 mg via ORAL
  Filled 2017-11-09 (×5): qty 1

## 2017-11-09 NOTE — Progress Notes (Signed)
PULMONARY / CRITICAL CARE MEDICINE   Name: Cameron Mendez MRN: [id number] DOB: 11-Apr-1946    ADMISSION DATE:  10/18/2017 CONSULTATION DATE:  11/06/2016   REFERRING MD:  Dr. Wendee Beavers  CHIEF COMPLAINT:  11/06/2017  HISTORY OF PRESENT ILLNESS:   71 year old male with past medical history as below, which is significant for end-stage renal disease on dialysis, COPD, CHF, coronary artery disease, history of cardiac arrest, dementia, blindness, and history of stroke.  He was recently admitted to [facility] with acute encephalopathy which was poorly defined but did seem to improve and he was discharged home with a palliative care referral.  He presented again to [facility] on 11/27 with complaints of altered mental status and is accompanied by his wife.  There was some concern for infection including meningitis so he is covered with Rocephin and also azithromycin with concern for pneumonia.  Lumbar puncture was performed in the emergency department as well.  Due to this acute illness he did miss dialysis and was seen by nephrology as an inpatient.  They felt as though he is volume overloaded and he was transferred to the dialysis unit for HD.  During his dialysis treatment after about 2.5 L was removed he suffered a cardiac arrest and was PEA.  He was resuscitated for a total of 12 minutes.  PCCM has been asked to evaluate.  SUBJECTIVE:  Off pressors, remains unresponsive.  Now weaning.  VITAL SIGNS: BP (!) 150/65   Pulse 96   Temp 99.1 F (37.3 C) (Oral)   Resp 18   Ht 5\' 3"  (1.6 m)   Wt 126 lb 12.2 oz (57.5 kg)   SpO2 100%   BMI 22.46 kg/m   HEMODYNAMICS:    VENTILATOR SETTINGS: Vent Mode: PSV;CPAP FiO2 (%):  [30 %] 30 % Set Rate:  [16 bmp] 16 bmp Vt Set:  [550 mL] 550 mL PEEP:  [5 cmH20] 5 cmH20 Pressure Support:  [15 cmH20] 15 cmH20 Plateau Pressure:  [13 cmH20-24 cmH20] 16 cmH20  INTAKE / OUTPUT: I/O last 3 completed shifts: In: 1400 [I.V.:350; NG/GT:1050] Out:  0   PHYSICAL EXAMINATION: General: Unresponsive 71 year old male currently on pressure support ventilation unresponsive. HEENT: Orally intubated no JVD, mucous membranes are moist.   Pulmonary: Clear to auscultation excellent tidal volume on pressure support ventilation equal chest rise no accessory muscle use. Cardiac: Regular rate and rhythm without murmur rub or gallop  abdomen: Soft non-tender, tolerating tube feeds. Extremities/musculoskeletal: No extremity edema, warm, brisk cap refill, good pulses. Neuro/psych unresponsive.  GCS 3.  Does not follow commands or move to noxious stimulus.  LABS:  BMET Recent Labs  Lab 11/08/17 0708 11/08/17 1616 11/09/17 0422  NA 138 140 139  K 3.5 3.5 3.2*  CL 100* 103 101  CO2 21* 23 22  BUN 57* 69* 86*  CREATININE 9.72* 10.75* 11.68*  GLUCOSE 113* 115* 116*    Electrolytes Recent Labs  Lab 11/06/17 2116  11/08/17 0708 11/08/17 1616 11/09/17 0422  CALCIUM 8.7*   < > 8.4* 8.8* 8.5*  MG 3.0*  --  2.9*  --  2.9*  PHOS 7.9*  --  6.9*  --  6.6*   < > = values in this interval not displayed.    CBC Recent Labs  Lab 11/07/17 0238 11/08/17 0708 11/09/17 0422  WBC 10.8* 9.5 5.6  HGB 11.2* 10.0* 8.6*  HCT 36.8* 32.7* 28.7*  PLT 68* 67* 69*    Coag's No results for input(s):  APTT, INR in the last 168 hours.  Sepsis Markers Recent Labs  Lab 11/06/17 1808 11/06/17 2116 11/07/17 0238 11/08/17 0708 11/08/17 1115  LATICACIDVEN 20.4* 12.1*  --   --  2.6*  PROCALCITON  --  6.32 65.79 >150.00  --     ABG Recent Labs  Lab 11/06/17 1848 11/09/17 0359  PHART 7.238* 7.489*  PCO2ART 31.8* 30.0*  PO2ART 371.0* 112*    Liver Enzymes Recent Labs  Lab 11/06/17 2116 11/08/17 1616 11/09/17 0422  AST 719* 499* 378*  ALT 411* 228* 181*  ALKPHOS 75 66 62  BILITOT 1.3* 0.8 0.7  ALBUMIN 3.3* 2.5* 2.4*    Cardiac Enzymes Recent Labs  Lab 11/07/17 0238 11/07/17 0715 11/08/17 1115  TROPONINI 0.26* 0.31* 0.27*     Glucose Recent Labs  Lab 11/08/17 1158 11/08/17 1618 11/08/17 2029 11/09/17 0000 11/09/17 0419 11/09/17 0838  GLUCAP 107* 105* 120* 81 110* 99    Imaging Dg Chest Port 1 View  Result Date: 11/09/2017 CLINICAL DATA:  Acute respiratory failure. EXAM: PORTABLE CHEST 1 VIEW COMPARISON:  Radiograph of November 08, 2017. FINDINGS: Stable cardiomediastinal silhouette. Endotracheal and nasogastric tubes are unchanged in position. No pneumothorax or pleural effusion is noted. Right sided ventriculoperitoneal shunt is again noted. No pneumothorax or pleural effusion is noted. Bony thorax is unremarkable. IMPRESSION: Stable support apparatus. No acute cardiopulmonary abnormality seen. Electronically Signed   By: Marijo Conception, M.D.   On: 11/09/2017 09:19    STUDIES:  LP 11/27 > clear, gluc 59, RBC 1, color yellow, xanthochromic, protein 276, WBC 4. CT chest 11/27 > Consolidations in the dependent right lower lobe and perifissural left upper lobe and lingula. Cardiomegaly with mild septal thickening/ pulmonary edema. Ground-glass opacities in the left lung and right upper lobe, likely alveolar pulmonary edema, less likely infectious pneumonitis. Small bilateral pleural effusions. Aortic atherosclerosis coronary artery calcifications. Mild emphysema. CT head 11/27 > No acute intracranial abnormality. Unchanged size and configuration of the shunted ventricles. Severe atrophy and white matter disease consistent with chronic ischemic microangiopathy.  CULTURES: CSF 11/27 > negative Blood 11/27 >  ANTIBIOTICS: Cefepime 11/27 >  CTX 11/27 > 11/28 Azithro 11/27 > 11/28 Vanco 11/27 > 11/30  SIGNIFICANT EVENTS: 11/27 admit  LINES/TUBES: 11/27 ETT >  DISCUSSION: 72 year old male with end-stage renal disease, COPD, CHF, coronary artery disease, dementia admitted with acute encephalopathy, sepsis.  Had a cardiac arrest during dialysis.  Suspect infection complicated by metabolic derangements  mediating cardiac arrest.  Course complicated by what looks to be anoxic injury.  Palliative care working with family now.  Suspect will be working towards one-way extubation at some point  ASSESSMENT / PLAN:   Cardiac arrest PEA 12 mins-->? R/t sepsis and metabolic derangements?  Acute on chronic diastolic CHF Hx HTN -off pressors -EF 41-32% w/ nml systolic fxn. PAS estimated at 38 mmHg; dilated LA  Plan Resuming norvasc Cont tele  DNR   Acute respiratory failure secondary to cardiac arrest COPD without acute exacerbation (has send RB in past 2016) ? Pulmonary edema now s/p HD with 2L removed -Mental status preventing extubation at this point. May be looking at one way extubation.  PLan Cont PSV as tolerated Volume removal w/ HD  Acute metabolic/anoxic encephalopathy. Concern for anoxic injury Dementia Hx stroke Blind EEG noted for diffuse slowing. No seizures Plan  Cont supportive care RASS goal 0 Awaiting  Further d/w family    Likely HCAP Also concern for meningitis on presentation but CSF  is not diagnostic  Plan Cefepime and vanc started 11/26 All cultures negative to date; dc vanc  ESRD on HD Mild hypokalemia  Plan HD MWF per nephrology  Repeat K in am  GERD Hx GIB Elevated LFTs. ? Shock liver from cardiac arrest-->improved Plan PRN LFTs Cont PPI   Anemia & Thrombocytopenia  Plan [place] heparin  Follow CBC Transfuse per protocol     FAMILY  - Updates: wife updated, understands severity of chronic illness and acute situation as well. DNR. No escalation. No procedures.  - Inter-disciplinary family meet or Palliative Care meeting due by:  12/3    My critical care time 35 minutes  Erick Colace ACNP-BC [place] Pager # [phone] OR # [phone] if no answer

## 2017-11-09 NOTE — Progress Notes (Signed)
[facility] Progress Note  Subjective: still not waking up  Vitals:   11/09/17 0630 11/09/17 0718 11/09/17 0724 11/09/17 0837  BP: 136/70 (!) 150/65    Pulse: 94 96    Resp: 16 18    Temp:    99.1 F (37.3 C)  TempSrc:    Oral  SpO2: 99% 98% 99%   Weight:      Height:        Inpatient medications: . chlorhexidine gluconate (MEDLINE KIT)  15 mL Mouth Rinse BID  . doxercalciferol  5 mcg Intravenous Q M,W,F-HD  . feeding supplement (NEPRO CARB STEADY)  1,000 mL Per Tube Q24H  . feeding supplement (PRO-STAT SUGAR FREE 64)  30 mL Per Tube BID  . heparin  5,000 Units Subcutaneous Q8H  . ipratropium-albuterol  3 mL Nebulization TID  . labetalol  5 mg Intravenous Once  . mouth rinse  15 mL Mouth Rinse QID  . pantoprazole sodium  40 mg Per Tube Daily  . sodium chloride flush  3 mL Intravenous Q12H   . sodium chloride    . ceFEPime (MAXIPIME) IV 2 g (11/07/17 1811)  . ferric gluconate (FERRLECIT/NULECIT) IV    . norepinephrine (LEVOPHED) Adult infusion Stopped (11/06/17 2248)  . vancomycin     sodium chloride, acetaminophen **OR** acetaminophen, fentaNYL (SUBLIMAZE) injection, fentaNYL (SUBLIMAZE) injection, labetalol, midazolam, midazolam, sodium chloride flush  Exam: Gen blind AAM, intubated No jvd or bruits Chest clear ant and lat RRR no MRG Abd soft ntnd no mass or ascites +bs MS no joint effusions or deformity Ext trace LE edema / no wounds or ulcers Neuro nonresponsive on the vent LUE AVF+bruit    Dialysis:  [place] MWF 4h  62.5kg  2/2.25  Hep none  LUE AVF -home BP: norvasc 5/ coreg 12.5 bid/ cardizem CD 120   Impression: 1. Cardiac arrest - PEA arrest on 11/27, 12 min code.  2. AMS - not waking up on vent, poss anoxic brain injury. Pall consult is pending.  3. VDRF - due to #1. pulm edema resolved  4. ESRD MWF HD 5. HTN - bp's up a bit, will resume home norvasc and coreg 6. Volume - down 5kg under dry, euvol on  exam 7. Blindness 8. Dementia/ hx CVA 41. Hx cardiac arrest - in 2016   Plan - missed HD yest, plan HD today and tomorrow.    Kelly Splinter MD [facility] pager [phone]   11/09/2017, 10:46 AM   Recent Labs  Lab 11/06/17 2116  11/08/17 0708 11/08/17 1616 11/09/17 0422  NA 140   < > 138 140 139  K 4.5   < > 3.5 3.5 3.2*  CL 98*   < > 100* 103 101  CO2 18*   < > 21* 23 22  GLUCOSE 109*   < > 113* 115* 116*  BUN 22*   < > 57* 69* 86*  CREATININE 8.10*   < > 9.72* 10.75* 11.68*  CALCIUM 8.7*   < > 8.4* 8.8* 8.5*  PHOS 7.9*  --  6.9*  --  6.6*   < > = values in this interval not displayed.   Recent Labs  Lab 11/06/17 2116 11/08/17 1616 11/09/17 0422  AST 719* 499* 378*  ALT 411* 228* 181*  ALKPHOS 75 66 62  BILITOT 1.3* 0.8 0.7  PROT 8.0 6.9 6.8  ALBUMIN 3.3* 2.5* 2.4*   Recent Labs  Lab 11/07/17 0238 11/08/17 0708 11/09/17 0422  WBC 10.8* 9.5  5.6  HGB 11.2* 10.0* 8.6*  HCT 36.8* 32.7* 28.7*  MCV 96.6 93.2 93.5  PLT 68* 67* 69*   Iron/TIBC/Ferritin/ %Sat    Component Value Date/Time   IRON 46 12/16/2016 1057   TIBC 176 (L) 12/16/2016 1057   FERRITIN 1,185 (H) 11/18/2015 0955   IRONPCTSAT 26 12/16/2016 1057   IRONPCTSAT 22 05/18/2009 1203

## 2017-11-09 NOTE — Progress Notes (Signed)
Dialysis treatment completed.  500 mL ultrafiltrated and net fluid removal 500 mL.    Patient status unchanged. Lung sounds diminished to ausculation in all fields. Generalized edema. Cardiac: NSR.  Disconnected lines and removed needles.  Pressure held for 10 minutes and band aid/gauze dressing applied.  Report given to bedside RN, Izola Price.

## 2017-11-09 NOTE — Progress Notes (Signed)
Arrived to patient room 13M-13 at 1630.  Reviewed treatment plan and this RN agrees.  Report received from bedside RN, Bigelow.  Consent verified.  Patient responds to pain. Lung sounds diminished to ausculation in all fields. Generalized edema. Cardiac: NSR.  Prepped LUAVF with alcohol and cannulated with two 15 gauge needles.  Pulsation of blood noted.  Flushed access well with saline per protocol.  Connected and secured lines and initiated tx at 1700.  UF goal of 500 mL and net fluid removal of 0 mL.  Will continue to monitor.

## 2017-11-09 NOTE — Consult Note (Signed)
Consultation Note Date: 11/09/2017   Patient Name: Cameron Mendez  DOB: Apr 30, 1946  MRN: [id number]  Age / Sex: 71 y.o., male  PCP: Cameron Beard, MD Referring Physician: Marshell Garfinkel, MD  Reason for Consultation: Establishing goals of care  HPI/Patient Profile: 71 y.o. male  with past medical history of ESRD, blindness, dementia, previous right parietal ventriculostomy (presumably for prior hydrocephalus) COPD, CAD, LVH who was admitted on 10/18/2017 after having cardiac arrest at hemodialysis.  He was resuscitated and ROSC was noted as 12 min.  He is currently intubated and has altered mental status.  Brain MRI shows severe chronic microvascular changes with parenchymal volume loss. Initial CXR showed pneumonia in RLL (aspiration?) with pulmonary edema as well.    Clinical Assessment and Goals of Care:  I have reviewed medical records including EPIC notes, labs and imaging, received report from the bedside RN, Cameron Mendez, assessed the patient and then spoke on the phone with his wife, Cameron Mendez, to discuss diagnosis prognosis, [place], EOL wishes.  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 It focuses on providing relief from the symptoms and stress of a serious illness. The goal is to improve quality of life for both the patient and the family.  We discussed a brief life review of the patient. Cameron Mendez describes him as very gentle and easy going.  He has never met a stranger and makes friends with everyone he meets.  He had multiple careers - with [employer], [employer], and with the [employer] Cameron Mendez and Cameron Mendez have 1 daughter Cameron Mendez) who lives here in [place].  As far as functional and nutritional status  Cameron Mendez mentioned he was walking at home and doing ok until November 2018.  Since then he has been having episodes of being altered and not eating well.  He was hospitalized in early November with altered mental status and volume overload.    Cameron Mendez mentioned that 2 years ago the patient had cardiac arrest.  He was having difficulty breathing - so fortunately she had already called EMS.  When they arrived his heart stopped.  He responded to defibrillation.  After that episode the patient expressed that he did not want to live his life on machines - he would want to be allowed to pass on peacefully.     I attempted to gather information about what would be the patient's minimal acceptable quality of life  - Cameron Mendez mentioned that she had not thought about that but she would give it some consideration.  I expressed to her that the doctors were very worried about [name] and that he may not do well.  Cameron Mendez and the family are very interested to hear how Cameron Mendez does with hemodialysis today.  They will not be at the hospital until after HD (past 5 pm.)   I expressed to Cameron Mendez that I would  Mendez forward to seeing her on Monday.   Primary Decision Maker:  NEXT OF [place]    SUMMARY OF RECOMMENDATIONS     Patient is currently tubed but "does not want to live his life on machines" - No tracheostomy.    Need to discuss the patient's underlying dementia, previous strokes and how that is impacting him now with possible anoxic brain injury.  Acute respiratory failure with aspiration pneumonia after cardiac arrest.   Had previous cardiac arrest 2 yrs ago.  PMT will continue to meet with the family in person.  Code Status/Advance Care Planning:  DNR   Symptom Management:   Per primary team.   Wife expressed that the patient suffers with left knee pain.  Additional Recommendations (Limitations, Scope, Preferences):  Full Scope Treatment at this point.  Psycho-social/Spiritual:  Desire for  further Chaplaincy support:  Yes, [facility].    Prognosis:  Poor prognosis.  Hospital death is a possibility.  Possible anoxic brain injury in the setting of significant dementia.  Acute respiratory failure with likely aspiration pneumonia.  Cardiac arrest in HD.  Has been failing recently with recurrent altered mental status.    Discharge Planning: Anticipated Hospital Death vs Hospice      Primary Diagnoses: Present on Admission: . Fever . Acute encephalopathy . Essential hypertension, benign . Cardiac arrest [facility])   I have reviewed the medical record, interviewed the patient and family, and examined the patient. The following aspects are pertinent.  Past Medical History:  Diagnosis Date  . Anemia   . Arthritis    Gout  . Blind   . BPH (benign prostatic hyperplasia)   . Cardiac arrest ([place])   . CHF (congestive heart failure) ([place])   . CKD (chronic kidney disease), stage IV ([place])    a. L upper extremity AV fistula created 07/2012.  . Colon polyps   . COPD (chronic obstructive pulmonary disease) ([place])   . Diastolic dysfunction   . Gangrene of finger ([place])    a. 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  . GI (gastrointestinal bleed) feb 2016  . Hypertension    dx--"long time"  . Irregular heart rate 03/03/2015  . Pericardial effusion    a. Noted on echo 10/2009. b. Again seen on echo 09/2013.  [name] Retinitis pigmentosa    Blindness--has shunt --placed yrs ago in [place]..  . Stroke (cerebrum) ([place])    12/19/16 CT scan revealed old left lacunar infarct   Social History   Socioeconomic History  . Marital status: Married    Spouse name: None  . Number of children: 1  . Years of education: None  . Highest education level: None  Social Needs  . Financial resource strain: None  . Food insecurity - worry: None  . Food insecurity - inability: None  . Transportation needs - medical: None  . Transportation needs - non-medical: None  Occupational History  . Occupation:  unemployeed  Tobacco Use  . Smoking status: Current Every Day Smoker    Packs/day: 0.50    Years: 50.00    Pack years: 25.00    Types: Cigarettes  . Smokeless tobacco: Never Used  . Tobacco comment: Pt given handout on how to quit smoking 12/31/14  Substance and Sexual Activity  . Alcohol use: No    Alcohol/week: 0.0 oz    Comment: "quit driinking in ~ 2000"  . Drug use: No  . Sexual activity: Not Currently  Other Topics Concern  . None  Social  History Narrative  . None   Family History  Problem Relation Age of Onset  . Ovarian cancer Mother   . Colon polyps Brother   . Colon cancer Brother   . Kidney disease Neg Hx   . Gallbladder disease Neg Hx   . Esophageal cancer Neg Hx   . Heart disease Neg Hx   . Stomach cancer Neg Hx    Scheduled Meds: . chlorhexidine gluconate (MEDLINE KIT)  15 mL Mouth Rinse BID  . doxercalciferol  5 mcg Intravenous Q M,W,F-HD  . feeding supplement (NEPRO CARB STEADY)  1,000 mL Per Tube Q24H  . feeding supplement (PRO-STAT SUGAR FREE 64)  30 mL Per Tube BID  . heparin  5,000 Units Subcutaneous Q8H  . ipratropium-albuterol  3 mL Nebulization TID  . labetalol  5 mg Intravenous Once  . mouth rinse  15 mL Mouth Rinse QID  . pantoprazole sodium  40 mg Per Tube Daily  . sodium chloride flush  3 mL Intravenous Q12H   Continuous Infusions: . sodium chloride    . ceFEPime (MAXIPIME) IV 2 g (11/07/17 1811)  . ferric gluconate (FERRLECIT/NULECIT) IV    . norepinephrine (LEVOPHED) Adult infusion Stopped (11/06/17 2248)  . vancomycin     PRN Meds:.sodium chloride, acetaminophen **OR** acetaminophen, fentaNYL (SUBLIMAZE) injection, fentaNYL (SUBLIMAZE) injection, labetalol, midazolam, midazolam, sodium chloride flush Allergies  Allergen Reactions  . Ibuprofen Other (See Comments)    Kidney problems-dialysis patient  . Nsaids Other (See Comments)    Kidney problems-dialysis patient   Review of Systems patient intubated and non-verbal  Physical  Exam  Thin frail gentleman, intubated.  Opens eyes but is blind.  Calm.  Moves left leg spontaneously.  Did not follow commands. CV rrr Resp no distress Abdomen soft, NT, periumbilical hernia LE no edema  Vital Signs: BP (!) 150/65   Pulse 96   Temp 99.1 F (37.3 C) (Oral)   Resp 18   Ht 5' 3"  (1.6 m)   Wt 57.5 kg (126 lb 12.2 oz)   SpO2 99%   BMI 22.46 kg/m  Pain Assessment: CPOT       SpO2: SpO2: 99 % O2 Device:SpO2: 99 % O2 Flow Rate: .O2 Flow Rate (L/min): 3 L/min  IO: Intake/output summary:   Intake/Output Summary (Last 24 hours) at 11/09/2017 1011 Last data filed at 11/09/2017 0800 Gross per 24 hour  Intake 930 ml  Output 0 ml  Net 930 ml    LBM: Last BM Date: 11/08/17 Baseline Weight: Weight: 59 kg (130 lb) Most recent weight: Weight: 57.5 kg (126 lb 12.2 oz)     Palliative Assessment/Data: 10%     Time In: 9:00 Time Out: 10:10 Time Total: 70 min. Greater than 50%  of this time was spent counseling and coordinating care related to the above assessment and plan.  Signed by: Florentina Jenny, PA-C Palliative Medicine Pager: [phone]  Please contact Palliative Medicine Team phone at [phone] for questions and concerns.  For individual provider: See Shea Evans

## 2017-11-10 LAB — CBC
HEMATOCRIT: 30.3 % — AB (ref 39.0–52.0)
Hemoglobin: 8.9 g/dL — ABNORMAL LOW (ref 13.0–17.0)
MCH: 28.3 pg (ref 26.0–34.0)
MCHC: 29.4 g/dL — ABNORMAL LOW (ref 30.0–36.0)
MCV: 96.5 fL (ref 78.0–100.0)
Platelets: 67 10*3/uL — ABNORMAL LOW (ref 150–400)
RBC: 3.14 MIL/uL — AB (ref 4.22–5.81)
RDW: 19.9 % — AB (ref 11.5–15.5)
WBC: 6.6 10*3/uL (ref 4.0–10.5)

## 2017-11-10 LAB — GLUCOSE, CAPILLARY
GLUCOSE-CAPILLARY: 100 mg/dL — AB (ref 65–99)
GLUCOSE-CAPILLARY: 107 mg/dL — AB (ref 65–99)
GLUCOSE-CAPILLARY: 128 mg/dL — AB (ref 65–99)
Glucose-Capillary: 102 mg/dL — ABNORMAL HIGH (ref 65–99)
Glucose-Capillary: 89 mg/dL (ref 65–99)
Glucose-Capillary: 96 mg/dL (ref 65–99)

## 2017-11-10 LAB — MAGNESIUM: Magnesium: 2.3 mg/dL (ref 1.7–2.4)

## 2017-11-10 LAB — CULTURE, BLOOD (ROUTINE X 2)
CULTURE: NO GROWTH
Culture: NO GROWTH
Special Requests: ADEQUATE
Special Requests: ADEQUATE

## 2017-11-10 LAB — BASIC METABOLIC PANEL
ANION GAP: 12 (ref 5–15)
BUN: 54 mg/dL — ABNORMAL HIGH (ref 6–20)
CALCIUM: 8.7 mg/dL — AB (ref 8.9–10.3)
CO2: 25 mmol/L (ref 22–32)
Chloride: 100 mmol/L — ABNORMAL LOW (ref 101–111)
Creatinine, Ser: 7.3 mg/dL — ABNORMAL HIGH (ref 0.61–1.24)
GFR, EST AFRICAN AMERICAN: 8 mL/min — AB (ref >60)
GFR, EST NON AFRICAN AMERICAN: 7 mL/min — AB (ref >60)
Glucose, Bld: 124 mg/dL — ABNORMAL HIGH (ref 65–99)
POTASSIUM: 4.1 mmol/L (ref 3.5–5.1)
Sodium: 137 mmol/L (ref 135–145)

## 2017-11-10 LAB — PHOSPHORUS: PHOSPHORUS: 4.8 mg/dL — AB (ref 2.5–4.6)

## 2017-11-10 MED ORDER — DEXTROSE 5 % IV SOLN
1.0000 g | Freq: Once | INTRAVENOUS | Status: AC
  Administered 2017-11-10: 1 g via INTRAVENOUS
  Filled 2017-11-10: qty 1

## 2017-11-10 NOTE — Progress Notes (Signed)
[facility] Progress Note  Subjective: no new issues  Vitals:   11/10/17 1115 11/10/17 1130 11/10/17 1140 11/10/17 1218  BP: (!) 125/54 (!) 122/49    Pulse: 72 71    Resp: (!) 24 (!) 22    Temp:    99.5 F (37.5 C)  TempSrc:    Oral  SpO2: 98% 93% 97%   Weight:      Height:        Inpatient medications: . amLODipine  5 mg Oral Daily  . carvedilol  12.5 mg Oral BID WC  . chlorhexidine gluconate (MEDLINE KIT)  15 mL Mouth Rinse BID  . doxercalciferol  5 mcg Intravenous Q M,W,F-HD  . feeding supplement (NEPRO CARB STEADY)  1,000 mL Per Tube Q24H  . feeding supplement (PRO-STAT SUGAR FREE 64)  30 mL Per Tube BID  . heparin  5,000 Units Subcutaneous Q8H  . ipratropium-albuterol  3 mL Nebulization TID  . labetalol  5 mg Intravenous Once  . mouth rinse  15 mL Mouth Rinse QID  . pantoprazole sodium  40 mg Per Tube Daily  . sodium chloride flush  3 mL Intravenous Q12H   . sodium chloride 250 mL (11/10/17 0000)  . ceFEPime (MAXIPIME) IV    . ceFEPime (MAXIPIME) IV Stopped (11/09/17 1830)  . ferric gluconate (FERRLECIT/NULECIT) IV Stopped (11/09/17 1815)   sodium chloride, acetaminophen **OR** acetaminophen, fentaNYL (SUBLIMAZE) injection, fentaNYL (SUBLIMAZE) injection, labetalol, midazolam, midazolam, sodium chloride flush  Exam: Gen blind AAM, intubated No jvd or bruits Chest clear ant and lat RRR no MRG Abd soft ntnd no mass or ascites +bs MS no joint effusions or deformity Ext trace LE edema / no wounds or ulcers Neuro nonresponsive on the vent LUE AVF+bruit    Dialysis:  [place] MWF 4h  62.5kg  2/2.25  Hep none  LUE AVF -home BP: norvasc 5/ coreg 12.5 bid/ cardizem CD 120   Impression: 1. Cardiac arrest - PEA arrest on 11/27, 12 min code.  2. AMS - not waking up on vent, poss anoxic brain injury. Seen by pall care, see their notes.  3. VDRF - due to #1. pulm edema resolved  4. ESRD MWF HD 5. HTN - have resumed home norvasc and coreg, BP's  good 6. Volume - under dry wt, euvol on exam 7. Blindness 8. Dementia/ hx CVA 70. Hx cardiac arrest - in 2016   Plan - no HD today, HD tomorrow off schedule   Kelly Splinter MD [facility] pager [phone]   11/10/2017, 12:48 PM   Recent Labs  Lab 11/08/17 0708 11/08/17 1616 11/09/17 0422 11/10/17 0951  NA 138 140 139 137  K 3.5 3.5 3.2* 4.1  CL 100* 103 101 100*  CO2 21* 23 22 25   GLUCOSE 113* 115* 116* 124*  BUN 57* 69* 86* 54*  CREATININE 9.72* 10.75* 11.68* 7.30*  CALCIUM 8.4* 8.8* 8.5* 8.7*  PHOS 6.9*  --  6.6* 4.8*   Recent Labs  Lab 11/06/17 2116 11/08/17 1616 11/09/17 0422  AST 719* 499* 378*  ALT 411* 228* 181*  ALKPHOS 75 66 62  BILITOT 1.3* 0.8 0.7  PROT 8.0 6.9 6.8  ALBUMIN 3.3* 2.5* 2.4*   Recent Labs  Lab 11/08/17 0708 11/09/17 0422 11/10/17 0951  WBC 9.5 5.6 6.6  HGB 10.0* 8.6* 8.9*  HCT 32.7* 28.7* 30.3*  MCV 93.2 93.5 96.5  PLT 67* 69* 67*   Iron/TIBC/Ferritin/ %Sat    Component Value Date/Time   IRON 46  12/16/2016 1057   TIBC 176 (L) 12/16/2016 1057   FERRITIN 1,185 (H) 11/18/2015 0955   IRONPCTSAT 26 12/16/2016 1057   IRONPCTSAT 22 05/18/2009 1203

## 2017-11-10 NOTE — Progress Notes (Signed)
PULMONARY / CRITICAL CARE MEDICINE   Name: Cameron Mendez MRN: [id number] DOB: 11/19/1946    ADMISSION DATE:  10/20/2017 CONSULTATION DATE:  11/06/2016   REFERRING MD:  Dr. Wendee Beavers  CHIEF COMPLAINT:  11/06/2017  HISTORY OF PRESENT ILLNESS:   71 year old male with past medical history as below, which is significant for end-stage renal disease on dialysis, COPD, CHF, coronary artery disease, history of cardiac arrest, dementia, blindness, and history of stroke.  He was recently admitted to [facility] with acute encephalopathy which was poorly defined but did seem to improve and he was discharged home with a palliative care referral.  He presented again to [facility] on 11/27 with complaints of altered mental status and is accompanied by his wife.  There was some concern for infection including meningitis so he is covered with Rocephin and also azithromycin with concern for pneumonia.  Lumbar puncture was performed in the emergency department as well.  Due to this acute illness he did miss dialysis and was seen by nephrology as an inpatient.  They felt as though he is volume overloaded and he was transferred to the dialysis unit for HD.  During his dialysis treatment after about 2.5 L was removed he suffered a cardiac arrest and was PEA.  He was resuscitated for a total of 12 minutes.  PCCM has been asked to evaluate.  SUBJECTIVE:  Mental status remains poor. No event overnight  VITAL SIGNS: BP (!) 170/78   Pulse 88   Temp (!) 100.4 F (38 C) (Oral)   Resp (!) 23   Ht 5\' 3"  (1.6 m)   Wt 127 lb 10.3 oz (57.9 kg)   SpO2 98%   BMI 22.61 kg/m   HEMODYNAMICS:    VENTILATOR SETTINGS: Vent Mode: PSV;CPAP FiO2 (%):  [30 %] 30 % Set Rate:  [16 bmp] 16 bmp Vt Set:  [550 mL] 550 mL PEEP:  [5 cmH20] 5 cmH20 Pressure Support:  [15 cmH20] 15 cmH20 Plateau Pressure:  [15 cmH20-29 cmH20] 18 cmH20  INTAKE / OUTPUT: I/O last 3 completed shifts: In: 1411 [I.V.:320; NG/GT:781; IV  Piggyback:310] Out: 0   PHYSICAL EXAMINATION: Gen:      No acute distress HEENT:  EOMI, sclera anicteric, ETT in place Neck:     No masses; no thyromegaly Lungs:    Clear to auscultation bilaterally; normal respiratory effort CV:         Regular rate and rhythm; no murmurs Abd:      + bowel sounds; soft, non-tender; no palpable masses, no distension Ext:    No edema; adequate peripheral perfusion Skin:      Warm and dry; no rash Neuro: Unresponsive. Does not follow commands, no response to noxious stimuli  LABS:  BMET Recent Labs  Lab 11/08/17 0708 11/08/17 1616 11/09/17 0422  NA 138 140 139  K 3.5 3.5 3.2*  CL 100* 103 101  CO2 21* 23 22  BUN 57* 69* 86*  CREATININE 9.72* 10.75* 11.68*  GLUCOSE 113* 115* 116*    Electrolytes Recent Labs  Lab 11/06/17 2116  11/08/17 0708 11/08/17 1616 11/09/17 0422  CALCIUM 8.7*   < > 8.4* 8.8* 8.5*  MG 3.0*  --  2.9*  --  2.9*  PHOS 7.9*  --  6.9*  --  6.6*   < > = values in this interval not displayed.    CBC Recent Labs  Lab 11/07/17 0238 11/08/17 0708 11/09/17 0422  WBC 10.8* 9.5 5.6  HGB 11.2* 10.0* 8.6*  HCT 36.8* 32.7* 28.7*  PLT 68* 67* 69*    Coag's No results for input(s): APTT, INR in the last 168 hours.  Sepsis Markers Recent Labs  Lab 11/06/17 1808 11/06/17 2116 11/07/17 0238 11/08/17 0708 11/08/17 1115  LATICACIDVEN 20.4* 12.1*  --   --  2.6*  PROCALCITON  --  6.32 65.79 >150.00  --     ABG Recent Labs  Lab 11/06/17 1848 11/09/17 0359  PHART 7.238* 7.489*  PCO2ART 31.8* 30.0*  PO2ART 371.0* 112*    Liver Enzymes Recent Labs  Lab 11/06/17 2116 11/08/17 1616 11/09/17 0422  AST 719* 499* 378*  ALT 411* 228* 181*  ALKPHOS 75 66 62  BILITOT 1.3* 0.8 0.7  ALBUMIN 3.3* 2.5* 2.4*    Cardiac Enzymes Recent Labs  Lab 11/07/17 0238 11/07/17 0715 11/08/17 1115  TROPONINI 0.26* 0.31* 0.27*    Glucose Recent Labs  Lab 11/09/17 1201 11/09/17 1618 11/09/17 2035 11/10/17 0006  11/10/17 0343 11/10/17 0836  GLUCAP 108* 104* 90 107* 96 102*    Imaging No results found.  STUDIES:  LP 11/27 > clear, gluc 59, RBC 1, color yellow, xanthochromic, protein 276, WBC 4. CT chest 11/27 > Consolidations in the dependent right lower lobe and perifissural left upper lobe and lingula. Cardiomegaly with mild septal thickening/ pulmonary edema. Ground-glass opacities in the left lung and right upper lobe, likely alveolar pulmonary edema, less likely infectious pneumonitis. Small bilateral pleural effusions. Aortic atherosclerosis coronary artery calcifications. Mild emphysema. CT head 11/27 > No acute intracranial abnormality. Unchanged size and configuration of the shunted ventricles. Severe atrophy and white matter disease consistent with chronic ischemic microangiopathy.  CULTURES: CSF 11/27 > negative Blood 11/27 >  ANTIBIOTICS: Cefepime 11/27 >  CTX 11/27 > 11/28 Azithro 11/27 > 11/28 Vanco 11/27 > 11/30  SIGNIFICANT EVENTS: 11/27 admit  LINES/TUBES: 11/27 ETT >  DISCUSSION: 71 year old male with end-stage renal disease, COPD, CHF, coronary artery disease, dementia admitted with acute encephalopathy, sepsis.  Had a cardiac arrest during dialysis.  Suspect infection complicated by metabolic derangements mediating cardiac arrest.  Course complicated by what looks to be anoxic injury.  Palliative care working with family now.  Suspect will be working towards one-way extubation at some point  ASSESSMENT / PLAN:   Cardiac arrest PEA 12 mins-->? R/t sepsis and metabolic derangements?  Acute on chronic diastolic CHF Hx HTN -off pressors -EF 26-71% w/ nml systolic fxn. PAS estimated at 38 mmHg; dilated LA  Plan Continue norvasc Telemonitoring DNR   Acute respiratory failure secondary to cardiac arrest COPD without acute exacerbation (has send RB in past 2016) ? Pulmonary edema now s/p HD with 2L removed -Mental status preventing extubation at this point. May be  looking at one way extubation.  PLan PSV weans as tolerated Volume removal w/ HD  Acute metabolic/anoxic encephalopathy. Concern for anoxic injury Dementia Hx stroke Blind EEG noted for diffuse slowing. No seizures Plan  Cont supportive care RASS goal 0 Awaiting  Further d/w family   Likely HCAP Also concern for meningitis on presentation but CSF is not diagnostic  Plan Continue on cefepime Follow cultures  ESRD on HD Mild hypokalemia  Plan HD MWF per nephrology   GERD Hx GIB Elevated LFTs. ? Shock liver from cardiac arrest-->improved Plan PRN LFTs Cont PPI  Anemia & Thrombocytopenia  Plan [place] heparin  Follow CBC Transfuse per protocol   FAMILY  - Updates: wife updated, understands severity of chronic illness and  acute situation as well. DNR. No escalation. No procedures.  - Inter-disciplinary family meet or Palliative Care meeting due by:  12/3  The patient is critically ill with multiple organ system failure and requires high complexity decision making for assessment and support, frequent evaluation and titration of therapies, advanced monitoring, review of radiographic studies and interpretation of complex data.   Critical Care Time devoted to patient care services, exclusive of separately billable procedures, described in this note is 35 minutes.   Marshell Garfinkel MD [facility] Pager [phone] If no answer or after 3pm call: [phone] 11/10/2017, 9:40 AM

## 2017-11-10 NOTE — Progress Notes (Signed)
Pharmacy Antibiotic Note  Cameron Mendez is a 71 y.o. male admitted on 10/21/2017 with pneumonia.  Pt is ESRD on HD MWF. WBC WNL. Remains on cefepime, day #5, all culture data negative to date.  Vanc 11/27 >> 11/30 Cefepime 11/27 >>  11/26 BCx: ngtd 11/27 CSF cx: ngtd   Protein high, glucose wnl  Plan: Cefepime 2g IV qHD - MWF Trend WBC, temp, HD schedule  F/U infectious work-up   Height: 5\' 3"  (160 cm) Weight: 127 lb 10.3 oz (57.9 kg) IBW/kg (Calculated) : 56.9  Temp (24hrs), Avg:98.7 F (37.1 C), Min:97.7 F (36.5 C), Max:100.4 F (38 C)  Recent Labs  Lab 11/06/17 0338 11/06/17 0609  11/06/17 1808 11/06/17 2116 11/07/17 0238 11/08/17 0708 11/08/17 1115 11/08/17 1616 11/09/17 0422  WBC 4.1  --   --   --  11.0* 10.8* 9.5  --   --  5.6  CREATININE 11.71*  --    < >  --  8.10* 7.70* 9.72*  --  10.75* 11.68*  LATICACIDVEN 1.4 1.2  --  20.4* 12.1*  --   --  2.6*  --   --    < > = values in this interval not displayed.      Allergies  Allergen Reactions  . Ibuprofen Other (See Comments)    Kidney problems-dialysis patient  . Nsaids Other (See Comments)    Kidney problems-dialysis patient     Harvel Quale 11/10/2017 10:20 AM

## 2017-11-10 NOTE — Progress Notes (Signed)
Pt temp of 100.4 F orally. Rn notified.

## 2017-11-10 DEATH — deceased

## 2017-11-11 DIAGNOSIS — Z515 Encounter for palliative care: Secondary | ICD-10-CM

## 2017-11-11 DIAGNOSIS — Z7189 Other specified counseling: Secondary | ICD-10-CM

## 2017-11-11 LAB — BASIC METABOLIC PANEL
Anion gap: 17 — ABNORMAL HIGH (ref 5–15)
BUN: 92 mg/dL — AB (ref 6–20)
CHLORIDE: 99 mmol/L — AB (ref 101–111)
CO2: 21 mmol/L — AB (ref 22–32)
CREATININE: 8.46 mg/dL — AB (ref 0.61–1.24)
Calcium: 8.7 mg/dL — ABNORMAL LOW (ref 8.9–10.3)
GFR calc Af Amer: 6 mL/min — ABNORMAL LOW (ref >60)
GFR calc non Af Amer: 6 mL/min — ABNORMAL LOW (ref >60)
Glucose, Bld: 104 mg/dL — ABNORMAL HIGH (ref 65–99)
POTASSIUM: 3.6 mmol/L (ref 3.5–5.1)
Sodium: 137 mmol/L (ref 135–145)

## 2017-11-11 LAB — GLUCOSE, CAPILLARY
GLUCOSE-CAPILLARY: 113 mg/dL — AB (ref 65–99)
GLUCOSE-CAPILLARY: 115 mg/dL — AB (ref 65–99)
GLUCOSE-CAPILLARY: 107 mg/dL — AB (ref 65–99)
GLUCOSE-CAPILLARY: 103 mg/dL — AB (ref 65–99)
Glucose-Capillary: 111 mg/dL — ABNORMAL HIGH (ref 65–99)
Glucose-Capillary: 116 mg/dL — ABNORMAL HIGH (ref 65–99)

## 2017-11-11 LAB — CBC
HCT: 27.6 % — ABNORMAL LOW (ref 39.0–52.0)
Hemoglobin: 8.2 g/dL — ABNORMAL LOW (ref 13.0–17.0)
MCH: 28.1 pg (ref 26.0–34.0)
MCHC: 29.7 g/dL — AB (ref 30.0–36.0)
MCV: 94.5 fL (ref 78.0–100.0)
Platelets: 77 10*3/uL — ABNORMAL LOW (ref 150–400)
RBC: 2.92 MIL/uL — ABNORMAL LOW (ref 4.22–5.81)
RDW: 19.7 % — ABNORMAL HIGH (ref 11.5–15.5)
WBC: 6.2 10*3/uL (ref 4.0–10.5)

## 2017-11-11 LAB — PHOSPHORUS: Phosphorus: 6.1 mg/dL — ABNORMAL HIGH (ref 2.5–4.6)

## 2017-11-11 LAB — MAGNESIUM: Magnesium: 2.3 mg/dL (ref 1.7–2.4)

## 2017-11-11 MED ORDER — PANTOPRAZOLE SODIUM 40 MG IV SOLR
40.0000 mg | INTRAVENOUS | Status: DC
  Administered 2017-11-11 – 2017-11-13 (×3): 40 mg via INTRAVENOUS
  Filled 2017-11-11 (×3): qty 40

## 2017-11-11 NOTE — Progress Notes (Signed)
eLink Physician-Brief Progress Note Patient Name: Cameron Mendez DOB: 1946/07/22 MRN: [id number]   Date of Service  11/11/2017  HPI/Events of Note  Vomiting and abdominal distention - NGT now to LIS. Request to change Protonix per tube to Protonix IV.   eICU Interventions  Will change Protonix per tube to Protonix IV.      Intervention Category Major Interventions: Other:  Lysle Dingwall 11/11/2017, 11:13 PM

## 2017-11-11 NOTE — Progress Notes (Signed)
[facility] Progress Note  Subjective: no new issues  Vitals:   11/11/17 0736 11/11/17 0836 11/11/17 0839 11/11/17 1122  BP: (!) 128/46  (!) 182/0 (!) 178/90  Pulse: 84  (!) 53 81  Resp: 19   (!) 22  Temp:  99.1 F (37.3 C)    TempSrc:  Oral    SpO2: 100%   96%  Weight:      Height:        Inpatient medications: . amLODipine  5 mg Oral Daily  . carvedilol  12.5 mg Oral BID WC  . chlorhexidine gluconate (MEDLINE KIT)  15 mL Mouth Rinse BID  . doxercalciferol  5 mcg Intravenous Q M,W,F-HD  . feeding supplement (NEPRO CARB STEADY)  1,000 mL Per Tube Q24H  . feeding supplement (PRO-STAT SUGAR FREE 64)  30 mL Per Tube BID  . heparin  5,000 Units Subcutaneous Q8H  . ipratropium-albuterol  3 mL Nebulization TID  . labetalol  5 mg Intravenous Once  . mouth rinse  15 mL Mouth Rinse QID  . pantoprazole sodium  40 mg Per Tube Daily  . sodium chloride flush  3 mL Intravenous Q12H   . sodium chloride 250 mL (11/11/17 0400)  . ceFEPime (MAXIPIME) IV Stopped (11/09/17 1830)  . ferric gluconate (FERRLECIT/NULECIT) IV Stopped (11/09/17 1815)   sodium chloride, acetaminophen **OR** acetaminophen, fentaNYL (SUBLIMAZE) injection, fentaNYL (SUBLIMAZE) injection, labetalol, midazolam, midazolam, sodium chloride flush  Exam: Gen blind AAM, intubated, no response to verbal or tactile stimulation No jvd or bruits Chest clear ant and lat RRR no MRG Abd soft ntnd no mass or ascites +bs MS no joint effusions or deformity Ext trace LE edema / no wounds or ulcers Neuro nonresponsive on the vent LUE AVF+bruit    Dialysis:  [place] MWF 4h  62.5kg  2/2.25  Hep none  LUE AVF -home BP: norvasc 5/ coreg 12.5 bid/ cardizem CD 120   Impression: 1. Cardiac arrest - PEA arrest on 11/27, 12 min code.  2. AMS - not waking up on vent, prob sig anoxic brain injury. Seen by pall care, see their notes.  3. VDRF - due to #1. pulm edema resolved  4. ESRD MWF HD 5. HTN - getting home  norvasc and coreg, BP's good 6. Volume - under dry wt, euvol on exam 7. Blindness 8. Dementia/ hx CVA 53. Hx cardiac arrest - in 2016   Plan - HD today off schedule. Do HD on TTS this week.  Pall care to follow up.    Kelly Splinter MD [facility] pager [phone]   11/11/2017, 11:35 AM   Recent Labs  Lab 11/09/17 0422 11/10/17 0951 11/11/17 0518  NA 139 137 137  K 3.2* 4.1 3.6  CL 101 100* 99*  CO2 22 25 21*  GLUCOSE 116* 124* 104*  BUN 86* 54* 92*  CREATININE 11.68* 7.30* 8.46*  CALCIUM 8.5* 8.7* 8.7*  PHOS 6.6* 4.8* 6.1*   Recent Labs  Lab 11/06/17 2116 11/08/17 1616 11/09/17 0422  AST 719* 499* 378*  ALT 411* 228* 181*  ALKPHOS 75 66 62  BILITOT 1.3* 0.8 0.7  PROT 8.0 6.9 6.8  ALBUMIN 3.3* 2.5* 2.4*   Recent Labs  Lab 11/09/17 0422 11/10/17 0951 11/11/17 0518  WBC 5.6 6.6 6.2  HGB 8.6* 8.9* 8.2*  HCT 28.7* 30.3* 27.6*  MCV 93.5 96.5 94.5  PLT 69* 67* 77*   Iron/TIBC/Ferritin/ %Sat    Component Value Date/Time   IRON 46 12/16/2016 1057  TIBC 176 (L) 12/16/2016 1057   FERRITIN 1,185 (H) 11/18/2015 0955   IRONPCTSAT 26 12/16/2016 1057   IRONPCTSAT 22 05/18/2009 1203

## 2017-11-11 NOTE — Progress Notes (Addendum)
Daily Progress Note   Patient Name: Cameron Mendez       Date: 11/11/2017 DOB: 01-26-1946  Age: 71 y.o. MRN#: [id number] Attending Physician: Marshell Garfinkel, MD Primary Care Physician: [name], MD Admit Date: 10/22/2017  Reason for Consultation/Follow-up: Establishing goals of care  Subjective: [name] is lying in bed, on vent. No sedation and unresponsive. No family at bedside.   Length of Stay: 6  Current Medications: Scheduled Meds:  . amLODipine  5 mg Oral Daily  . carvedilol  12.5 mg Oral BID WC  . chlorhexidine gluconate (MEDLINE KIT)  15 mL Mouth Rinse BID  . doxercalciferol  5 mcg Intravenous Q M,W,F-HD  . feeding supplement (NEPRO CARB STEADY)  1,000 mL Per Tube Q24H  . feeding supplement (PRO-STAT SUGAR FREE 64)  30 mL Per Tube BID  . heparin  5,000 Units Subcutaneous Q8H  . ipratropium-albuterol  3 mL Nebulization TID  . labetalol  5 mg Intravenous Once  . mouth rinse  15 mL Mouth Rinse QID  . pantoprazole sodium  40 mg Per Tube Daily  . sodium chloride flush  3 mL Intravenous Q12H    Continuous Infusions: . sodium chloride 250 mL (11/11/17 0400)  . ceFEPime (MAXIPIME) IV Stopped (11/09/17 1830)  . ferric gluconate (FERRLECIT/NULECIT) IV Stopped (11/09/17 1815)    PRN Meds: sodium chloride, acetaminophen **OR** acetaminophen, fentaNYL (SUBLIMAZE) injection, fentaNYL (SUBLIMAZE) injection, labetalol, midazolam, midazolam, sodium chloride flush  Physical Exam  Constitutional: He appears well-developed. He is intubated.  HENT:  Head: Normocephalic and atraumatic.  Cardiovascular: Normal rate.  PVCs  Pulmonary/Chest: Effort normal. No accessory muscle usage. No tachypnea. He is intubated. No respiratory distress.  Abdominal: Soft. Normal appearance.    Neurological: He is unresponsive.  Nursing note and vitals reviewed.           Vital Signs: BP (!) 182/0   Pulse (!) 53   Temp 99.1 F (37.3 C) (Oral)   Resp 19   Ht 5' 3"  (1.6 m)   Wt 57.4 kg (126 lb 8.7 oz)   SpO2 100%   BMI 22.42 kg/m  SpO2: SpO2: 100 % O2 Device: O2 Device: Ventilator O2 Flow Rate: O2 Flow Rate (L/min): 3 L/min  Intake/output summary:   Intake/Output Summary (Last 24 hours) at 11/11/2017 1022 Last data filed at  11/11/2017 0600 Gross per 24 hour  Intake 1050 ml  Output 250 ml  Net 800 ml   LBM: Last BM Date: 11/09/17 Baseline Weight: Weight: 59 kg (130 lb) Most recent weight: Weight: 57.4 kg (126 lb 8.7 oz)       Palliative Assessment/Data:      Patient Active Problem List   Diagnosis Date Noted  . Fever 10/18/2017  . Acute encephalopathy 10/15/2017  . Encephalopathy   . GI bleed 12/15/2016  . Hypokalemia 12/15/2016  . Gastrointestinal hemorrhage   . Symptomatic anemia 11/18/2015  . Acute coronary syndrome ([place]) 09/28/2015  . HCAP (healthcare-associated pneumonia) 09/18/2015  . Cardiac arrest ([place]) 09/13/2015  . Paroxysmal atrial fibrillation ([place]) 03/03/2015  . Tobacco abuse 01/10/2015  . Pleural effusion 01/09/2015  . Chronic diastolic CHF (congestive heart failure) ([place]) 01/08/2015  . History of adenomatous polyp of colon 12/31/2014  . Pancytopenia ([place]) 08/21/2014  . COPD (chronic obstructive pulmonary disease) ([place]) 05/12/2014  . COPD exacerbation ([place]) 04/29/2014  . History of pericardiocentesis 04/27/2014  . Volume overload 04/27/2014  . Anemia 09/18/2013  . Thrombocytopenia ([place]) 09/18/2013  . ESRD on hemodialysis ([place]) 09/18/2013  . Essential hypertension, benign 10/04/2009  . BLINDNESS, [place], [place] DEFINITION 10/01/2009  . OSTEOARTHRITIS 10/01/2009  . Gout, unspecified 09/28/2009    Palliative Care Assessment & Plan   HPI: 71 y.o. male  with past medical history of ESRD, blindness, dementia, previous right parietal  ventriculostomy (presumably for prior hydrocephalus) COPD, CAD, LVH who was admitted from hemodialysis (did not complete) on 10/21/2017 with AMS x ~1 week with poor intake and fever and cough. On 11/27 he was sent for HD in the hospital and had PEA arrest; he was resuscitated and ROSC was noted as 12 min.  He is currently intubated and has altered mental status.  Brain MRI shows severe chronic microvascular changes with parenchymal volume loss. Initial CXR showed pneumonia in RLL (aspiration?) with pulmonary edema as well. Mr. Cass has remained unresponsive to all stimuli and on ventilator and no sedation on board. Prognosis grim.   Assessment: [name] remains unresponsive and did not even respond to pain for me. Does not open eyes even spontaneously. No family at bedside so I called and spoke with his wife, [name]. I reviewed with [name] our current concerns that her husband is showing no improvement in his responsiveness. [name] confirms that she has seen no improvement or response when she visits as well.  We discussed that we need to talk further about where to go from here. [name] agrees to meet in person tomorrow along with their daughter. We will meet 12/3 at 1000 am to further discuss goals of care.   Recommendations/Plan:  Will speak with family tomorrow about compassionate extubation and comfort care.   Goals of Care and Additional Recommendations:  Limitations on Scope of Treatment: No Tracheostomy  Code Status:  DNR  Prognosis:   Hours to days especially if transition to comfort.   Discharge Planning:  Likely hospital death.   Thank you for allowing the Palliative Medicine Team to assist in the care of this patient.   Total Time 25 min Prolonged Time Billed  no       Greater than 50%  of this time was spent counseling and coordinating care related to the above assessment and plan.  Vinie Sill, NP Palliative Medicine Team Pager # [phone] (M-F 8a-5p) Team  Phone # [phone] (Nights/Weekends)

## 2017-11-11 NOTE — Progress Notes (Signed)
PULMONARY / CRITICAL CARE MEDICINE   Name: Cameron Mendez MRN: [id number] DOB: 1946/02/08    ADMISSION DATE:  10/21/2017 CONSULTATION DATE:  11/06/2016   REFERRING MD:  Dr. Wendee Beavers  CHIEF COMPLAINT:  11/06/2017  HISTORY OF PRESENT ILLNESS:   71 year old male with past medical history as below, which is significant for end-stage renal disease on dialysis, COPD, CHF, coronary artery disease, history of cardiac arrest, dementia, blindness, and history of stroke.  He was recently admitted to [facility] with acute encephalopathy which was poorly defined but did seem to improve and he was discharged home with a palliative care referral.  He presented again to [facility] on 11/27 with complaints of altered mental status and is accompanied by his wife.  There was some concern for infection including meningitis so he is covered with Rocephin and also azithromycin with concern for pneumonia.  Lumbar puncture was performed in the emergency department as well.  Due to this acute illness he did miss dialysis and was seen by nephrology as an inpatient.  They felt as though he is volume overloaded and he was transferred to the dialysis unit for HD.  During his dialysis treatment after about 2.5 L was removed he suffered a cardiac arrest and was PEA.  He was resuscitated for a total of 12 minutes.  PCCM has been asked to evaluate.  SUBJECTIVE:  No events overnight. Mental status is unchanged  VITAL SIGNS: BP (!) 128/46   Pulse 84   Temp 99 F (37.2 C) (Oral)   Resp 19   Ht 5\' 3"  (1.6 m)   Wt 126 lb 8.7 oz (57.4 kg)   SpO2 100%   BMI 22.42 kg/m   HEMODYNAMICS:    VENTILATOR SETTINGS: Vent Mode: PSV;CPAP FiO2 (%):  [30 %] 30 % Set Rate:  [16 bmp] 16 bmp Vt Set:  [550 mL] 550 mL PEEP:  [5 cmH20] 5 cmH20 Pressure Support:  [15 cmH20] 15 cmH20 Plateau Pressure:  [15 cmH20-25 cmH20] 16 cmH20  INTAKE / OUTPUT: I/O last 3 completed shifts: In: 4166 [I.V.:360; Other:40; NG/GT:1180;  IV Piggyback:200] Out: 250 [Stool:250]  PHYSICAL EXAMINATION: Gen:      No acute distress HEENT:  EOMI, sclera anicteric Neck:     No masses; no thyromegaly, ETT in place Lungs:    Clear to auscultation bilaterally; normal respiratory effort CV:         Regular rate and rhythm; no murmurs Abd:      + bowel sounds; soft, non-tender; no palpable masses, no distension Ext:    No edema; adequate peripheral perfusion Skin:      Warm and dry; no rash Neuro: Comatose. No response to noxious stimuli  LABS:  BMET Recent Labs  Lab 11/09/17 0422 11/10/17 0951 11/11/17 0518  NA 139 137 137  K 3.2* 4.1 3.6  CL 101 100* 99*  CO2 22 25 21*  BUN 86* 54* 92*  CREATININE 11.68* 7.30* 8.46*  GLUCOSE 116* 124* 104*    Electrolytes Recent Labs  Lab 11/09/17 0422 11/10/17 0951 11/11/17 0518  CALCIUM 8.5* 8.7* 8.7*  MG 2.9* 2.3 2.3  PHOS 6.6* 4.8* 6.1*    CBC Recent Labs  Lab 11/09/17 0422 11/10/17 0951 11/11/17 0518  WBC 5.6 6.6 6.2  HGB 8.6* 8.9* 8.2*  HCT 28.7* 30.3* 27.6*  PLT 69* 67* 77*    Coag's No results for input(s): APTT, INR in the last 168 hours.  Sepsis Markers Recent Labs  Lab  11/06/17 1808 11/06/17 2116 11/07/17 0238 11/08/17 0708 11/08/17 1115  LATICACIDVEN 20.4* 12.1*  --   --  2.6*  PROCALCITON  --  6.32 65.79 >150.00  --     ABG Recent Labs  Lab 11/06/17 1848 11/09/17 0359  PHART 7.238* 7.489*  PCO2ART 31.8* 30.0*  PO2ART 371.0* 112*    Liver Enzymes Recent Labs  Lab 11/06/17 2116 11/08/17 1616 11/09/17 0422  AST 719* 499* 378*  ALT 411* 228* 181*  ALKPHOS 75 66 62  BILITOT 1.3* 0.8 0.7  ALBUMIN 3.3* 2.5* 2.4*    Cardiac Enzymes Recent Labs  Lab 11/07/17 0238 11/07/17 0715 11/08/17 1115  TROPONINI 0.26* 0.31* 0.27*    Glucose Recent Labs  Lab 11/10/17 0343 11/10/17 0836 11/10/17 1219 11/10/17 1643 11/10/17 2335 11/11/17 0350  GLUCAP 96 102* 100* 128* 89 116*    Imaging No results found.  STUDIES:  LP  11/27 > clear, gluc 59, RBC 1, color yellow, xanthochromic, protein 276, WBC 4. CT chest 11/27 > Consolidations in the dependent right lower lobe and perifissural left upper lobe and lingula. Cardiomegaly with mild septal thickening/ pulmonary edema. Ground-glass opacities in the left lung and right upper lobe, likely alveolar pulmonary edema, less likely infectious pneumonitis. Small bilateral pleural effusions. Aortic atherosclerosis coronary artery calcifications. Mild emphysema. CT head 11/27 > No acute intracranial abnormality. Unchanged size and configuration of the shunted ventricles. Severe atrophy and white matter disease consistent with chronic ischemic microangiopathy.  CULTURES: CSF 11/27 > negative Blood 11/27 >  ANTIBIOTICS: Cefepime 11/27 >  CTX 11/27 > 11/28 Azithro 11/27 > 11/28 Vanco 11/27 > 11/30  SIGNIFICANT EVENTS: 11/27 admit  LINES/TUBES: 11/27 ETT >  DISCUSSION: 71 year old male with end-stage renal disease, COPD, CHF, coronary artery disease, dementia admitted with acute encephalopathy, sepsis.  Had a cardiac arrest during dialysis.  Suspect infection complicated by metabolic derangements mediating cardiac arrest.  Course complicated by what looks to be anoxic injury.  Palliative care working with family now.  Suspect will be working towards one-way extubation at some point  ASSESSMENT / PLAN:   Cardiac arrest PEA 12 mins-->? R/t sepsis and metabolic derangements?  Acute on chronic diastolic CHF Hx HTN -off pressors -EF 66-06% w/ nml systolic fxn. PAS estimated at 38 mmHg; dilated LA  Plan Continue norvasc Tele monitoring  Acute respiratory failure secondary to cardiac arrest COPD without acute exacerbation (has send RB in past 2016) ? Pulmonary edema now s/p HD with 2L removed -Mental status preventing extubation at this point. May be looking at one way extubation.  PLan PSV weans Poor mental status is a barrier to extubation  Acute metabolic/anoxic  encephalopathy. Concern for anoxic injury Dementia Hx stroke Blind EEG noted for diffuse slowing. No seizures Plan  Cont supportive care RASS goal 0 Awaiting  Further d/w family  Code status is DNR  Likely HCAP Also concern for meningitis on presentation but CSF is not diagnostic  Plan Continue on cefepime Follow cultures  ESRD on HD Mild hypokalemia  Plan HD MWF per nephrology   GERD Hx GIB Elevated LFTs. ? Shock liver from cardiac arrest-->improved Plan PRN LFTs Cont PPI  Anemia & Thrombocytopenia  Plan [place] heparin  Follow CBC Transfuse per protocol   FAMILY  - Updates: wife updated, understands severity of chronic illness and acute situation as well. DNR. No escalation. No procedures.  - Inter-disciplinary family meet or Palliative Care meeting due by:  12/3  The patient is critically ill with multiple organ  system failure and requires high complexity decision making for assessment and support, frequent evaluation and titration of therapies, advanced monitoring, review of radiographic studies and interpretation of complex data.   Critical Care Time devoted to patient care services, exclusive of separately billable procedures, described in this note is 35 minutes.   Marshell Garfinkel MD [facility] Pager [phone] If no answer or after 3pm call: [phone] 11/11/2017, 8:36 AM

## 2017-11-11 NOTE — Progress Notes (Signed)
Visited with patient and had prayer as requested. HULAN SZUMSKI, Chaplain   11/11/17 1700  Clinical Encounter Type  Visited With Patient  Visit Type Initial;Spiritual support  Referral From Nurse  Consult/Referral To Chaplain  Spiritual Encounters  Spiritual Needs Prayer  Stress Factors  Patient Stress Factors Health changes  Family Stress Factors Loss

## 2017-11-12 ENCOUNTER — Inpatient Hospital Stay (HOSPITAL_COMMUNITY): Payer: Medicare Other

## 2017-11-12 DIAGNOSIS — Z7189 Other specified counseling: Secondary | ICD-10-CM

## 2017-11-12 DIAGNOSIS — J9601 Acute respiratory failure with hypoxia: Secondary | ICD-10-CM

## 2017-11-12 LAB — GLUCOSE, CAPILLARY
GLUCOSE-CAPILLARY: 94 mg/dL (ref 65–99)
GLUCOSE-CAPILLARY: 104 mg/dL — AB (ref 65–99)
GLUCOSE-CAPILLARY: 99 mg/dL (ref 65–99)
Glucose-Capillary: 103 mg/dL — ABNORMAL HIGH (ref 65–99)
Glucose-Capillary: 109 mg/dL — ABNORMAL HIGH (ref 65–99)
Glucose-Capillary: 91 mg/dL (ref 65–99)

## 2017-11-12 LAB — CBC
HEMATOCRIT: 27.2 % — AB (ref 39.0–52.0)
Hemoglobin: 8.2 g/dL — ABNORMAL LOW (ref 13.0–17.0)
MCH: 28.6 pg (ref 26.0–34.0)
MCHC: 30.1 g/dL (ref 30.0–36.0)
MCV: 94.8 fL (ref 78.0–100.0)
PLATELETS: 74 10*3/uL — AB (ref 150–400)
RBC: 2.87 MIL/uL — ABNORMAL LOW (ref 4.22–5.81)
RDW: 19.6 % — AB (ref 11.5–15.5)
WBC: 6 10*3/uL (ref 4.0–10.5)

## 2017-11-12 LAB — RENAL FUNCTION PANEL
ALBUMIN: 2.1 g/dL — AB (ref 3.5–5.0)
Anion gap: 15 (ref 5–15)
BUN: 119 mg/dL — AB (ref 6–20)
CHLORIDE: 101 mmol/L (ref 101–111)
CO2: 23 mmol/L (ref 22–32)
CREATININE: 9.96 mg/dL — AB (ref 0.61–1.24)
Calcium: 8.5 mg/dL — ABNORMAL LOW (ref 8.9–10.3)
GFR calc Af Amer: 5 mL/min — ABNORMAL LOW (ref >60)
GFR, EST NON AFRICAN AMERICAN: 5 mL/min — AB (ref >60)
GLUCOSE: 112 mg/dL — AB (ref 65–99)
POTASSIUM: 4 mmol/L (ref 3.5–5.1)
Phosphorus: 6.3 mg/dL — ABNORMAL HIGH (ref 2.5–4.6)
Sodium: 139 mmol/L (ref 135–145)

## 2017-11-12 LAB — PHOSPHORUS: Phosphorus: 6.2 mg/dL — ABNORMAL HIGH (ref 2.5–4.6)

## 2017-11-12 LAB — MAGNESIUM: MAGNESIUM: 2.3 mg/dL (ref 1.7–2.4)

## 2017-11-12 MED ORDER — LIDOCAINE-PRILOCAINE 2.5-2.5 % EX CREA: 1.0000 "application " | TOPICAL_CREAM | CUTANEOUS | Status: DC | PRN

## 2017-11-12 MED ORDER — HEPARIN SODIUM (PORCINE) 1000 UNIT/ML DIALYSIS: 1000.0000 [IU] | INTRAMUSCULAR | Status: DC | PRN

## 2017-11-12 MED ORDER — SODIUM CHLORIDE 0.9 % IV SOLN: 100.0000 mL | INTRAVENOUS | Status: DC | PRN

## 2017-11-12 MED ORDER — PENTAFLUOROPROP-TETRAFLUOROETH EX AERO: 1.0000 "application " | INHALATION_SPRAY | CUTANEOUS | Status: DC | PRN

## 2017-11-12 MED ORDER — ALTEPLASE 2 MG IJ SOLR: 2.0000 mg | Freq: Once | INTRAMUSCULAR | Status: DC | PRN

## 2017-11-12 MED ORDER — LIDOCAINE HCL (PF) 1 % IJ SOLN: 5.0000 mL | INTRAMUSCULAR | Status: DC | PRN

## 2017-11-12 MED ORDER — GLYCOPYRROLATE 0.2 MG/ML IJ SOLN
0.2000 mg | INTRAMUSCULAR | Status: DC
  Administered 2017-11-12 – 2017-11-17 (×27): 0.2 mg via INTRAVENOUS
  Filled 2017-11-12 (×30): qty 1

## 2017-11-12 NOTE — Progress Notes (Addendum)
Pharmacy notified of current intentions to run patient HD on TTS schedule.   Will continue to monitor.

## 2017-11-12 NOTE — Progress Notes (Signed)
Arrived to patient room 61M-13 at 0035.  Reviewed treatment plan and this RN agrees.  Report received from bedside RN, Izola Price.  Consent verified.  Patient responds to pain. Lung sounds diminished and coarse to ausculation in all fields. Generalized edema. Cardiac: NSR.  Prepped LUAVF with alcohol and cannulated with two 15 gauge needles.  Pulsation of blood noted.  Flushed access well with saline per protocol.  Connected and secured lines and initiated tx at 0100.  UF goal of 500 mL and net fluid removal of 0 mL.  Will continue to monitor.

## 2017-11-12 NOTE — Progress Notes (Signed)
Dialysis treatment completed.  500 mL ultrafiltrated and net fluid removal 0 mL.    Patient status unchanged. Lung sounds diminished and coarse to ausculation in all fields. Generalized edema. Cardiac: NSR.  Disconnected lines and removed needles.  Pressure held for 10 minutes and band aid/gauze dressing applied.  Report given to bedside RN, Izola Price.

## 2017-11-12 NOTE — Progress Notes (Signed)
Daily Progress Note   Patient Name: Cameron Mendez       Date: 11/12/2017 DOB: Mar 15, 1946  Age: 71 y.o. MRN#: [id number] Attending Physician: Marshell Garfinkel, MD Primary Care Physician: [name], MD Admit Date: 10/24/2017  Reason for Consultation/Follow-up: Establishing goals of care  Subjective: Cameron Mendez continues to be unresponsive on vent.   Length of Stay: 7  Current Medications: Scheduled Meds:  . amLODipine  5 mg Oral Daily  . carvedilol  12.5 mg Oral BID WC  . chlorhexidine gluconate (MEDLINE KIT)  15 mL Mouth Rinse BID  . doxercalciferol  5 mcg Intravenous Q M,W,F-HD  . feeding supplement (NEPRO CARB STEADY)  1,000 mL Per Tube Q24H  . feeding supplement (PRO-STAT SUGAR FREE 64)  30 mL Per Tube BID  . heparin  5,000 Units Subcutaneous Q8H  . ipratropium-albuterol  3 mL Nebulization TID  . labetalol  5 mg Intravenous Once  . mouth rinse  15 mL Mouth Rinse QID  . pantoprazole (PROTONIX) IV  40 mg Intravenous Q24H  . sodium chloride flush  3 mL Intravenous Q12H    Continuous Infusions: . sodium chloride 250 mL (11/12/17 0400)  . sodium chloride    . sodium chloride    . ceFEPime (MAXIPIME) IV Stopped (11/09/17 1830)  . ferric gluconate (FERRLECIT/NULECIT) IV Stopped (11/09/17 1815)    PRN Meds: sodium chloride, sodium chloride, sodium chloride, acetaminophen **OR** acetaminophen, alteplase, fentaNYL (SUBLIMAZE) injection, fentaNYL (SUBLIMAZE) injection, heparin, labetalol, lidocaine (PF), lidocaine-prilocaine, midazolam, midazolam, pentafluoroprop-tetrafluoroeth, sodium chloride flush  Physical Exam  Constitutional: He appears well-developed. He is intubated.  HENT:  Head: Normocephalic and atraumatic.  Cardiovascular: Normal rate.  PVCs  Pulmonary/Chest:  Effort normal. No accessory muscle usage. No tachypnea. He is intubated. No respiratory distress.  Abdominal: Soft. Normal appearance.  Neurological: He is unresponsive.  Nursing note and vitals reviewed.           Vital Signs: BP (!) 120/58   Pulse 78   Temp 98 F (36.7 C) (Oral)   Resp (!) 23   Ht 5' 3" (1.6 m)   Wt 63 kg (138 lb 14.2 oz)   SpO2 97%   BMI 24.60 kg/m  SpO2: SpO2: 97 % O2 Device: O2 Device: Ventilator O2 Flow Rate: O2 Flow Rate (L/min): 3 L/min  Intake/output summary:  Intake/Output Summary (Last 24 hours) at 11/12/2017 1053 Last data filed at 11/12/2017 0700 Gross per 24 hour  Intake 610 ml  Output 500 ml  Net 110 ml   LBM: Last BM Date: 11/09/17 Baseline Weight: Weight: 59 kg (130 lb) Most recent weight: Weight: 63 kg (138 lb 14.2 oz)       Palliative Assessment/Data:      Patient Active Problem List   Diagnosis Date Noted  . Goals of care, counseling/discussion   . Palliative care encounter   . Fever 10/14/2017  . Acute encephalopathy 10/15/2017  . Encephalopathy   . GI bleed 12/15/2016  . Hypokalemia 12/15/2016  . Gastrointestinal hemorrhage   . Symptomatic anemia 11/18/2015  . Acute coronary syndrome ([place]) 09/28/2015  . HCAP (healthcare-associated pneumonia) 09/18/2015  . Cardiac arrest ([place]) 09/13/2015  . Paroxysmal atrial fibrillation ([place]) 03/03/2015  . Tobacco abuse 01/10/2015  . Pleural effusion 01/09/2015  . Chronic diastolic CHF (congestive heart failure) ([place]) 01/08/2015  . History of adenomatous polyp of colon 12/31/2014  . Pancytopenia ([place]) 08/21/2014  . COPD (chronic obstructive pulmonary disease) ([place]) 05/12/2014  . COPD exacerbation ([place]) 04/29/2014  . History of pericardiocentesis 04/27/2014  . Volume overload 04/27/2014  . Anemia 09/18/2013  . Thrombocytopenia ([place]) 09/18/2013  . ESRD on hemodialysis ([place]) 09/18/2013  . Essential hypertension, benign 10/04/2009  . BLINDNESS, [place], [place] DEFINITION 10/01/2009  .  OSTEOARTHRITIS 10/01/2009  . Gout, unspecified 09/28/2009    Palliative Care Assessment & Plan   HPI: 71 y.o. male  with past medical history of ESRD, blindness, dementia, previous right parietal ventriculostomy (presumably for prior hydrocephalus) COPD, CAD, LVH who was admitted from hemodialysis (did not complete) on 10/23/2017 with AMS x ~1 week with poor intake and fever and cough. On 11/27 he was sent for HD in the hospital and had PEA arrest; he was resuscitated and ROSC was noted as 12 min.  He is currently intubated and has altered mental status.  Brain MRI shows severe chronic microvascular changes with parenchymal volume loss. Initial CXR showed pneumonia in RLL (aspiration?) with pulmonary edema as well. Cameron Mendez has remained unresponsive to all stimuli and on ventilator and no sedation on board. Prognosis grim.   Assessment: Mr. Toto is continued on vent and continues to be completely unresponsive. I met today with his wife, daughter, and sister. They all understand his poor prognosis and poor QOL. They have dealt with a lot of death in the past few years (they had to extubate their granddaughter at 35 yo 2 years ago) and are unfortunately familiar with one way extubation. We discussed how extubation is the next step to focus on comfort for Cameron Mendez as his wishes are to not be prolonged on machines artificially. They are all on board with this plan and agree that this needs to be done - wife Cameron Mendez is very overwhelmed. They wish for some time to process and notify family members. They would like a couple days to allow family to visit. They have requested to remeet Wednesday 0900 am where I anticipate extubation at this time. In the mean time our focus will be on comfort. Emotional support provided to family.   Recommendations/Plan:  Anticipate one way extubation Wednesday 12/5 0900.    Goals of Care and Additional Recommendations:  Limitations on Scope of Treatment: No  Tracheostomy  Code Status:  DNR  Prognosis:   Hours to days especially if transition to comfort.   Discharge Planning:  Anticipate hospital death.  Thank you for allowing the Palliative Medicine Team to assist in the care of this patient.   Total Time 50 min Prolonged Time Billed  no       Greater than 50%  of this time was spent counseling and coordinating care related to the above assessment and plan.   , NP Palliative Medicine Team Pager # [phone] (M-F 8a-5p) Team Phone # [phone] (Nights/Weekends)      

## 2017-11-12 NOTE — Progress Notes (Signed)
PULMONARY / CRITICAL CARE MEDICINE   Name: Cameron Mendez MRN: [id number] DOB: [birth year]    ADMISSION DATE:  10/17/2017 CONSULTATION DATE:  11/06/2016   REFERRING MD:  Dr. Wendee Beavers  CHIEF COMPLAINT:  11/06/2017  HISTORY OF PRESENT ILLNESS:   71 year old male with past medical history as below, which is significant for end-stage renal disease on dialysis, COPD, CHF, coronary artery disease, history of cardiac arrest, dementia, blindness, and history of stroke.  He was recently admitted to [facility] with acute encephalopathy which was poorly defined but did seem to improve and he was discharged home with a palliative care referral.  He presented again to [facility] on 11/27 with complaints of altered mental status and is accompanied by his wife.  There was some concern for infection including meningitis so he is covered with Rocephin and also azithromycin with concern for pneumonia.  Lumbar puncture was performed in the emergency department as well.  Due to this acute illness he did miss dialysis and was seen by nephrology as an inpatient.  They felt as though he is volume overloaded and he was transferred to the dialysis unit for HD.  During his dialysis treatment after about 2.5 L was removed he suffered a cardiac arrest and was PEA.  He was resuscitated for a total of 12 minutes.  PCCM has been asked to evaluate.  SUBJECTIVE:  RN reports pt's TF on hold, has been vomiting.  Afebrile.  Family at bedside.    VITAL SIGNS: BP (!) 120/58   Pulse 78   Temp 98 F (36.7 C) (Oral)   Resp (!) 23   Ht 5\' 3"  (1.6 m)   Wt 138 lb 14.2 oz (63 kg)   SpO2 97%   BMI 24.60 kg/m   HEMODYNAMICS:    VENTILATOR SETTINGS: Vent Mode: PSV;CPAP FiO2 (%):  [30 %] 30 % Set Rate:  [15 bmp] 15 bmp Vt Set:  [550 mL] 550 mL PEEP:  [5 cmH20] 5 cmH20 Pressure Support:  [14 cmH20-15 cmH20] 14 cmH20 Plateau Pressure:  [14 cmH20-27 cmH20] 27 cmH20  INTAKE / OUTPUT: I/O last 3 completed  shifts: In: 1430 [I.V.:380; Other:40; NG/GT:960; IV Piggyback:50] Out: 750 [Emesis/NG output:350; Stool:400]  PHYSICAL EXAMINATION: General:  Chronically ill appearing male in NAD HEENT: MM pink/moist, ETT Neuro: no response to verbal stimuli, +gag, weaning on PSV, downward gaze CV: s1s2 rrr, no m/r/g PULM: even/non-labored, lungs bilaterally clear  UT:MLYY, non-tender, bsx4 active  Extremities: warm/dry, no edema  Skin: no rashes or lesions  LABS:  BMET Recent Labs  Lab 11/10/17 0951 11/11/17 0518 11/12/17 0004  NA 137 137 139  K 4.1 3.6 4.0  CL 100* 99* 101  CO2 25 21* 23  BUN 54* 92* 119*  CREATININE 7.30* 8.46* 9.96*  GLUCOSE 124* 104* 112*    Electrolytes Recent Labs  Lab 11/10/17 0951 11/11/17 0518 11/12/17 0004  CALCIUM 8.7* 8.7* 8.5*  MG 2.3 2.3 2.3  PHOS 4.8* 6.1* 6.2*  6.3*    CBC Recent Labs  Lab 11/10/17 0951 11/11/17 0518 11/12/17 0004  WBC 6.6 6.2 6.0  HGB 8.9* 8.2* 8.2*  HCT 30.3* 27.6* 27.2*  PLT 67* 77* 74*    Coag's No results for input(s): APTT, INR in the last 168 hours.  Sepsis Markers Recent Labs  Lab 11/06/17 1808 11/06/17 2116 11/07/17 0238 11/08/17 0708 11/08/17 1115  LATICACIDVEN 20.4* 12.1*  --   --  2.6*  PROCALCITON  --  6.32 65.79 >150.00  --  ABG Recent Labs  Lab 11/06/17 1848 11/09/17 0359  PHART 7.238* 7.489*  PCO2ART 31.8* 30.0*  PO2ART 371.0* 112*    Liver Enzymes Recent Labs  Lab 11/06/17 2116 11/08/17 1616 11/09/17 0422 11/12/17 0004  AST 719* 499* 378*  --   ALT 411* 228* 181*  --   ALKPHOS 75 66 62  --   BILITOT 1.3* 0.8 0.7  --   ALBUMIN 3.3* 2.5* 2.4* 2.1*    Cardiac Enzymes Recent Labs  Lab 11/07/17 0238 11/07/17 0715 11/08/17 1115  TROPONINI 0.26* 0.31* 0.27*    Glucose Recent Labs  Lab 11/11/17 1208 11/11/17 1630 11/11/17 1948 11/11/17 2342 11/12/17 0411 11/12/17 0813  GLUCAP 115* 107* 103* 111* 103* 109*    Imaging Dg Chest Port 1 View  Result Date:  11/12/2017 CLINICAL DATA:  Acute respiratory failure. EXAM: PORTABLE CHEST 1 VIEW COMPARISON:  11/09/2017. FINDINGS: Mild cardiac enlargement is stable. Support tubes and lines including ETT, and nasogastric tube grossly stable. Ventriculoperitoneal shunt tubing overlies the RIGHT chest, with some apparent discontinuity of the more lateral tube, likely abandoned revision. There is no consolidation or edema. No pneumothorax is evident. IMPRESSION: Mild cardiac enlargement. No consolidation or edema. Support tubes and lines appear stable. Electronically Signed   By: Staci Righter M.D.   On: 11/12/2017 07:13    STUDIES:  LP 11/27 > clear, gluc 59, RBC 1, color yellow, xanthochromic, protein 276, WBC 4. CT chest 11/27 > Consolidations in the dependent right lower lobe and perifissural left upper lobe and lingula. Cardiomegaly with mild septal thickening/ pulmonary edema. Ground-glass opacities in the left lung and right upper lobe, likely alveolar pulmonary edema, less likely infectious pneumonitis. Small bilateral pleural effusions. Aortic atherosclerosis coronary artery calcifications. Mild emphysema. CT head 11/27 > No acute intracranial abnormality. Unchanged size and configuration of the shunted ventricles. Severe atrophy and white matter disease consistent with chronic ischemic microangiopathy.  CULTURES: CSF 11/27 >> negative Blood 11/27 >>  ANTIBIOTICS: Cefepime 11/27 >> CTX 11/27 >> 11/28 Azithro 11/27 >> 11/28 Vanco 11/27 >> 11/30  SIGNIFICANT EVENTS: 11/27  Admit, arrest during HD 12/03  Continues to have poor neuro exam, family meeting with Palliative   LINES/TUBES: 11/27 ETT >>  DISCUSSION: 71 year old male with end-stage renal disease, COPD, CHF, coronary artery disease, dementia admitted with acute encephalopathy, sepsis.  Had a cardiac arrest during dialysis.  Suspect infection complicated by metabolic derangements mediating cardiac arrest.  Course complicated by what looks to  be anoxic injury.  Palliative care working with family now.  Suspect will be working towards one-way extubation at some point  ASSESSMENT / PLAN:   Cardiac Arrest -  PEA 12 mins, ? R/t sepsis and metabolic derangements?  Off pressors Acute on chronic diastolic CHF- LVEF 89-38% w normal systolic function, PAS estimated 38, dilated LA Hx HTN P: ICU monitoring  Continue norvasc, coreg Tele monitoring  DNR in the event of arrest   Acute respiratory failure secondary to cardiac arrest COPD without acute exacerbation (has send RB in past 2016) ? Pulmonary edema - resolved  P: PSV wean Mental status barrier to extubation  Duoneb TID   Acute metabolic/anoxic encephalopathy. Concern for anoxic injury Dementia Hx stroke Blind EEG noted for diffuse slowing. No seizures P: Ongoing discussions with family RASS goal: 0  Minimize sedation as able  Likely HCAP Also concern for meningitis on presentation but CSF is not diagnostic  P: ABX as above  Follow cultures  ESRD on HD Mild hypokalemia  P: HD per Nephrology  Replace electrolytes as indicated  GERD Hx GIB Elevated LFTs. ? Shock liver from cardiac arrest  Vomiting - r/o ileus P: PPI  Follow LFT's Hold TF with vomiting   Anemia Thrombocytopenia  P: Trend CBC  Heparin for DVT prophylaxis    FAMILY  - Updates: wife updated at bedside 12/3 am at bedside.  DNR, no escalation of care.  Family waiting on a few other members to arrive.  Wife / daughter are both CNA's at a SNF.   - Inter-disciplinary family meet or Palliative Care meeting due by:  12/3  CC Time: 4 minutes   [name], NP-C [place] Pgr: [phone] or if no answer [phone] 11/12/2017, 11:17 AM  Attending Note:  71 year old male with dementia and ESRD-HD who coded in HD.  Patient is not making progress from a neurologic standpoint.  On exam, lungs are clear and patient is not following commands.  I reviewed CXR myself, ETT is  in good position.  Palliative care following.  DNR confirmed, no further escalation of care.  Awaiting some family members and likely withdrawal.  PCCM will follow.  Appreciate input from palliative care.  The patient is critically ill with multiple organ systems failure and requires high complexity decision making for assessment and support, frequent evaluation and titration of therapies, application of advanced monitoring technologies and extensive interpretation of multiple databases.   Critical Care Time devoted to patient care services described in this note is  35  Minutes. This time reflects time of care of this signee Dr Jennet Maduro. This critical care time does not reflect procedure time, or teaching time or supervisory time of PA/NP/Med student/Med Resident etc but could involve care discussion time.  Rush Farmer, M.D. [facility] Pulmonary/Critical Care Medicine. Pager: [phone]. After hours pager: [phone].

## 2017-11-12 NOTE — Progress Notes (Signed)
Pt weaned on PS 14/5 for 5 hours. Placed back on full support d/t no pt effort. Pt resting comfortably with stable VS and no signs of distress.

## 2017-11-12 NOTE — Progress Notes (Signed)
  [facility] Progress Note   Assessment/ Plan:   1. Cardiac arrest - PEA arrest on 11/27, 12 min code.  2. AMS - not waking up on vent, prob sig anoxic brain injury. Seen by pall care, appreciate assistance  3. VDRF - due to #1. pulm edema resolved  4. ESRD MWF HD normally, dialyzing off schedule now TTS. 5. HTN - getting home norvasc and coreg, BP's good 6. Volume - under dry wt, euvol on exam 7. Blindness 8. Dementia/ hx CVA 73. Hx cardiac arrest - in 2016     Subjective:    Unresponsive.  Little change.     Objective:   BP (!) 159/63   Pulse 81   Temp 98 F (36.7 C) (Oral)   Resp 15   Ht '5\' 3"'$  (1.6 m)   Wt 63 kg (138 lb 14.2 oz)   SpO2 97%   BMI 24.60 kg/m   Physical Exam: GENunresponsive HEENT No jvd or bruits PULM  clear ant and lat CV RRR no MRG ABD soft ntnd no mass or ascites +bs EXT: trace LEedema / no wounds or ulcers Neuro nonresponsive on the vent LUE AVF+bruit    Labs: BMET Recent Labs  Lab 11/06/17 2116 11/07/17 0238 11/08/17 0708 11/08/17 1616 11/09/17 0422 11/10/17 0951 11/11/17 0518 11/12/17 0004  NA 140 138 138 140 139 137 137 139  K 4.5 3.9 3.5 3.5 3.2* 4.1 3.6 4.0  CL 98* 97* 100* 103 101 100* 99* 101  CO2 18* 20* 21* '23 22 25 '$ 21* 23  GLUCOSE 109* 107* 113* 115* 116* 124* 104* 112*  BUN 22* 26* 57* 69* 86* 54* 92* 119*  CREATININE 8.10* 7.70* 9.72* 10.75* 11.68* 7.30* 8.46* 9.96*  CALCIUM 8.7* 8.4* 8.4* 8.8* 8.5* 8.7* 8.7* 8.5*  PHOS 7.9*  --  6.9*  --  6.6* 4.8* 6.1* 6.2*  6.3*   CBC Recent Labs  Lab 11/09/17 0422 11/10/17 0951 11/11/17 0518 11/12/17 0004  WBC 5.6 6.6 6.2 6.0  HGB 8.6* 8.9* 8.2* 8.2*  HCT 28.7* 30.3* 27.6* 27.2*  MCV 93.5 96.5 94.5 94.8  PLT 69* 67* 77* 74*    '@IMGRELPRIORS'$ @ Medications:    . amLODipine  5 mg Oral Daily  . carvedilol  12.5 mg Oral BID WC  . chlorhexidine gluconate (MEDLINE KIT)  15 mL Mouth Rinse BID  . doxercalciferol  5 mcg Intravenous Q M,W,F-HD  .  feeding supplement (NEPRO CARB STEADY)  1,000 mL Per Tube Q24H  . feeding supplement (PRO-STAT SUGAR FREE 64)  30 mL Per Tube BID  . heparin  5,000 Units Subcutaneous Q8H  . ipratropium-albuterol  3 mL Nebulization TID  . labetalol  5 mg Intravenous Once  . mouth rinse  15 mL Mouth Rinse QID  . pantoprazole (PROTONIX) IV  40 mg Intravenous Q24H  . sodium chloride flush  3 mL Intravenous Q12H     Madelon Lips MD [facility] pgr [phone] 11/12/2017, 9:23 AM

## 2017-11-13 DIAGNOSIS — G931 Anoxic brain damage, not elsewhere classified: Secondary | ICD-10-CM

## 2017-11-13 DIAGNOSIS — J9600 Acute respiratory failure, unspecified whether with hypoxia or hypercapnia: Secondary | ICD-10-CM

## 2017-11-13 DIAGNOSIS — Z515 Encounter for palliative care: Secondary | ICD-10-CM

## 2017-11-13 LAB — BASIC METABOLIC PANEL
Anion gap: 19 — ABNORMAL HIGH (ref 5–15)
BUN: 78 mg/dL — ABNORMAL HIGH (ref 6–20)
CHLORIDE: 94 mmol/L — AB (ref 101–111)
CO2: 25 mmol/L (ref 22–32)
CREATININE: 7.39 mg/dL — AB (ref 0.61–1.24)
Calcium: 9.2 mg/dL (ref 8.9–10.3)
GFR, EST AFRICAN AMERICAN: 8 mL/min — AB (ref >60)
GFR, EST NON AFRICAN AMERICAN: 7 mL/min — AB (ref >60)
Glucose, Bld: 87 mg/dL (ref 65–99)
POTASSIUM: 4.4 mmol/L (ref 3.5–5.1)
SODIUM: 138 mmol/L (ref 135–145)

## 2017-11-13 LAB — GLUCOSE, CAPILLARY
GLUCOSE-CAPILLARY: 85 mg/dL (ref 65–99)
GLUCOSE-CAPILLARY: 92 mg/dL (ref 65–99)
GLUCOSE-CAPILLARY: 96 mg/dL (ref 65–99)
GLUCOSE-CAPILLARY: 92 mg/dL (ref 65–99)
GLUCOSE-CAPILLARY: 103 mg/dL — AB (ref 65–99)
Glucose-Capillary: 101 mg/dL — ABNORMAL HIGH (ref 65–99)

## 2017-11-13 LAB — CBC
HEMATOCRIT: 29.7 % — AB (ref 39.0–52.0)
Hemoglobin: 9.1 g/dL — ABNORMAL LOW (ref 13.0–17.0)
MCH: 29.1 pg (ref 26.0–34.0)
MCHC: 30.6 g/dL (ref 30.0–36.0)
MCV: 94.9 fL (ref 78.0–100.0)
PLATELETS: 86 10*3/uL — AB (ref 150–400)
RBC: 3.13 MIL/uL — AB (ref 4.22–5.81)
RDW: 20.1 % — AB (ref 11.5–15.5)
WBC: 6.4 10*3/uL (ref 4.0–10.5)

## 2017-11-13 MED ORDER — CARVEDILOL 12.5 MG PO TABS
12.5000 mg | ORAL_TABLET | Freq: Two times a day (BID) | ORAL | Status: DC
  Administered 2017-11-13 – 2017-11-14 (×3): 12.5 mg
  Filled 2017-11-13 (×3): qty 1

## 2017-11-13 MED ORDER — AMLODIPINE BESYLATE 5 MG PO TABS
5.0000 mg | ORAL_TABLET | Freq: Every day | ORAL | Status: DC
  Administered 2017-11-13: 5 mg
  Filled 2017-11-13: qty 1

## 2017-11-13 NOTE — Progress Notes (Signed)
Daily Progress Note   Patient Name: Cameron Mendez       Date: 11/13/2017 DOB: 1946/05/07  Age: 71 y.o. MRN#: [id number] Attending Physician: Marshell Garfinkel, MD Primary Care Physician: [name], MD Admit Date: 10/26/2017  Reason for Consultation/Follow-up: Establishing goals of care  Subjective: Cameron Mendez is unchanged. No family at bedside.   Length of Stay: 8  Current Medications: Scheduled Meds:  . amLODipine  5 mg Per Tube Daily  . carvedilol  12.5 mg Per Tube BID WC  . chlorhexidine gluconate (MEDLINE KIT)  15 mL Mouth Rinse BID  . doxercalciferol  5 mcg Intravenous Q M,W,F-HD  . feeding supplement (PRO-STAT SUGAR FREE 64)  30 mL Per Tube BID  . glycopyrrolate  0.2 mg Intravenous Q4H  . heparin  5,000 Units Subcutaneous Q8H  . ipratropium-albuterol  3 mL Nebulization TID  . mouth rinse  15 mL Mouth Rinse QID  . pantoprazole (PROTONIX) IV  40 mg Intravenous Q24H  . sodium chloride flush  3 mL Intravenous Q12H    Continuous Infusions: . sodium chloride 250 mL (11/12/17 2024)  . sodium chloride    . sodium chloride    . ceFEPime (MAXIPIME) IV Stopped (11/09/17 1830)  . ferric gluconate (FERRLECIT/NULECIT) IV Stopped (11/09/17 1815)    PRN Meds: sodium chloride, sodium chloride, sodium chloride, acetaminophen **OR** acetaminophen, alteplase, fentaNYL (SUBLIMAZE) injection, heparin, labetalol, lidocaine (PF), lidocaine-prilocaine, midazolam, pentafluoroprop-tetrafluoroeth, sodium chloride flush  Physical Exam         Constitutional: He appears well-developed. He is intubated.  HENT:  Head: Normocephalic and atraumatic.  Cardiovascular: Normal rate.  PVCs  Pulmonary/Chest: Effort normal. No accessory muscle usage. No tachypnea. He is intubated. No respiratory  distress.  Abdominal: Soft. Normal appearance.  Neurological: He is unresponsive.  Nursing note and vitals reviewed.   Vital Signs: BP (!) 137/56   Pulse 91   Temp 98.7 F (37.1 C)   Resp 15   Ht _0  (1.6 m)   Wt 57.4 kg (126 lb 8.7 oz)   SpO2 98%   BMI 22.42 kg/m  SpO2: SpO2: 98 % O2 Device: O2 Device: Ventilator O2 Flow Rate: O2 Flow Rate (L/min): 3 L/min  Intake/output summary:   Intake/Output Summary (Last 24 hours) at 11/13/2017 1038 Last data filed at 11/13/2017 1000 Gross per 24  hour  Intake 340 ml  Output 250 ml  Net 90 ml   LBM: Last BM Date: 11/13/17 Baseline Weight: Weight: 59 kg (130 lb) Most recent weight: Weight: 57.4 kg (126 lb 8.7 oz)       Palliative Assessment/Data: 10%   Flowsheet Rows     Most Recent Value  Intake Tab  Referral Department  Pulmonary  Unit at Time of Referral  Med/Surg Unit  Palliative Care Primary Diagnosis  Cardiac  Date Notified  11/08/17  Palliative Care Type  New Palliative care  Reason for referral  Clarify Goals of Care  Date of Admission  11/08/2017  Date first seen by Palliative Care  11/09/17  # of days Palliative referral response time  1 Day(s)  # of days IP prior to Palliative referral  3  Clinical Assessment  Psychosocial & Spiritual Assessment  Palliative Care Outcomes      Patient Active Problem List   Diagnosis Date Noted  . Goals of care, counseling/discussion   . Palliative care encounter   . Fever 10/27/2017  . Acute encephalopathy 10/15/2017  . Encephalopathy   . GI bleed 12/15/2016  . Hypokalemia 12/15/2016  . Gastrointestinal hemorrhage   . Symptomatic anemia 11/18/2015  . Acute coronary syndrome ([place]) 09/28/2015  . HCAP (healthcare-associated pneumonia) 09/18/2015  . Cardiac arrest ([place]) 09/13/2015  . Paroxysmal atrial fibrillation ([place]) 03/03/2015  . Tobacco abuse 01/10/2015  . Pleural effusion 01/09/2015  . Chronic diastolic CHF (congestive heart failure) ([place]) 01/08/2015  . History  of adenomatous polyp of colon 12/31/2014  . Pancytopenia ([place]) 08/21/2014  . COPD (chronic obstructive pulmonary disease) ([place]) 05/12/2014  . COPD exacerbation ([place]) 04/29/2014  . History of pericardiocentesis 04/27/2014  . Volume overload 04/27/2014  . Anemia 09/18/2013  . Thrombocytopenia ([place]) 09/18/2013  . ESRD on hemodialysis ([place]) 09/18/2013  . Essential hypertension, benign 10/04/2009  . BLINDNESS, [place], [place] DEFINITION 10/01/2009  . OSTEOARTHRITIS 10/01/2009  . Gout, unspecified 09/28/2009    Palliative Care Assessment & Plan   HPI: 71 y.o.malewith past medical history of ESRD, blindness, dementia, previous right parietal ventriculostomy (presumably for prior hydrocephalus) COPD, CAD, LVHwho was admitted from hemodialysis (did not complete) on 11/26/2018with AMS x ~1 week with poor intake and fever and cough. On 11/27 he was sent for HD in the hospital and had PEA arrest; he was resuscitated and ROSC was noted as 12 min. He is currently intubated and has altered mental status. Brain MRI shows severe chronic microvascular changes with parenchymal volume loss. Initial CXR showed pneumonia in RLL (aspiration?) with pulmonary edema as well. Cameron Mendez has remained unresponsive to all stimuli and on ventilator and no sedation on board. Prognosis grim.    Assessment: Cameron Mendez is unchanged today. I d/c tube feeding d/t abd distention and vomiting with focus on comfort. Agree with holding dialysis as family planning for one way extubation (likely tomorrow) and goal for comfort. No more vomiting per RN. No further signs/symptoms of discomfort evident on exam.   I called to touch base with family and spoke with daughter, Ivin Booty. Offered support. She has no questions or concerns at this time. She and her mother are both at work today and she confirmed meeting again tomorrow at 0900. Encouraged her that they have my number if they have any questions/concerns or anything else we can do to  support them at this time.   Recommendations/Plan:  Anticipate one way extubation to comfort care tomorrow.   Goals of Care and Additional Recommendations:  Limitations on Scope of Treatment: No Tracheostomy  Code Status:  DNR  Prognosis:   Hours - Days  Discharge Planning:  Anticipated Hospital Death  Care plan was discussed with RN at bedside.   Thank you for allowing the Palliative Medicine Team to assist in the care of this patient.   Total Time 15 min Prolonged Time Billed  no       Greater than 50%  of this time was spent counseling and coordinating care related to the above assessment and plan.  Vinie Sill, NP Palliative Medicine Team Pager # [phone] (M-F 8a-5p) Team Phone # [phone] (Nights/Weekends)

## 2017-11-13 NOTE — Progress Notes (Signed)
PULMONARY / CRITICAL CARE MEDICINE   Name: Cameron Mendez MRN: [id number] DOB: 03-Apr-1946    ADMISSION DATE:  11/04/2017 CONSULTATION DATE:  11/06/2016   REFERRING MD:  Dr. Wendee Beavers  CHIEF COMPLAINT:  11/06/2017  HISTORY OF PRESENT ILLNESS:   71 year old male with past medical history as below, which is significant for end-stage renal disease on dialysis, COPD, CHF, coronary artery disease, history of cardiac arrest, dementia, blindness, and history of stroke.  He was recently admitted to [facility] with acute encephalopathy which was poorly defined but did seem to improve and he was discharged home with a palliative care referral.  He presented again to [facility] on 11/27 with complaints of altered mental status and is accompanied by his wife.  There was some concern for infection including meningitis so he is covered with Rocephin and also azithromycin with concern for pneumonia.  Lumbar puncture was performed in the emergency department as well.  Due to this acute illness he did miss dialysis and was seen by nephrology as an inpatient.  They felt as though he is volume overloaded and he was transferred to the dialysis unit for HD.  During his dialysis treatment after about 2.5 L was removed he suffered a cardiac arrest and was PEA.  He was resuscitated for a total of 12 minutes.  PCCM has been asked to evaluate.  SUBJECTIVE:  Planned family meeting Wednesday 0900 with Palliative.  No acute changes per RN.   VITAL SIGNS: BP (!) 151/63 (BP Location: Right Wrist)   Pulse 87   Temp 98.7 F (37.1 C)   Resp (!) 21   Ht 5\' 3"  (1.6 m)   Wt 126 lb 8.7 oz (57.4 kg)   SpO2 99%   BMI 22.42 kg/m   HEMODYNAMICS:    VENTILATOR SETTINGS: Vent Mode: PRVC FiO2 (%):  [30 %] 30 % Set Rate:  [16 bmp] 16 bmp Vt Set:  [550 mL] 550 mL PEEP:  [5 cmH20] 5 cmH20 Pressure Support:  [14 cmH20] 14 cmH20 Plateau Pressure:  [19 cmH20-28 cmH20] 19 cmH20  INTAKE / OUTPUT: I/O last 3  completed shifts: In: 460 [I.V.:340; NG/GT:120] Out: 750 [Emesis/NG output:600; Stool:150]  PHYSICAL EXAMINATION: General:  Ill appearing male in NAD on vent HEENT: MM pink/dry, no jvd, ETT Neuro: unresponsive / obtunded  CV: s1s2 rrr, no m/r/g PULM: even/non-labored, lungs bilaterally clear  RC:VELF, non-tender, bsx4 active  Extremities: warm/dry, no edema  Skin: no rashes or lesions   LABS:  BMET Recent Labs  Lab 11/11/17 0518 11/12/17 0004 11/13/17 0310  NA 137 139 138  K 3.6 4.0 4.4  CL 99* 101 94*  CO2 21* 23 25  BUN 92* 119* 78*  CREATININE 8.46* 9.96* 7.39*  GLUCOSE 104* 112* 87    Electrolytes Recent Labs  Lab 11/10/17 0951 11/11/17 0518 11/12/17 0004 11/13/17 0310  CALCIUM 8.7* 8.7* 8.5* 9.2  MG 2.3 2.3 2.3  --   PHOS 4.8* 6.1* 6.2*  6.3*  --     CBC Recent Labs  Lab 11/11/17 0518 11/12/17 0004 11/13/17 0310  WBC 6.2 6.0 6.4  HGB 8.2* 8.2* 9.1*  HCT 27.6* 27.2* 29.7*  PLT 77* 74* 86*    Coag's No results for input(s): APTT, INR in the last 168 hours.  Sepsis Markers Recent Labs  Lab 11/06/17 1808 11/06/17 2116 11/07/17 0238 11/08/17 0708 11/08/17 1115  LATICACIDVEN 20.4* 12.1*  --   --  2.6*  PROCALCITON  --  6.32  65.79 >150.00  --     ABG Recent Labs  Lab 11/06/17 1848 11/09/17 0359  PHART 7.238* 7.489*  PCO2ART 31.8* 30.0*  PO2ART 371.0* 112*    Liver Enzymes Recent Labs  Lab 11/06/17 2116 11/08/17 1616 11/09/17 0422 11/12/17 0004  AST 719* 499* 378*  --   ALT 411* 228* 181*  --   ALKPHOS 75 66 62  --   BILITOT 1.3* 0.8 0.7  --   ALBUMIN 3.3* 2.5* 2.4* 2.1*    Cardiac Enzymes Recent Labs  Lab 11/07/17 0238 11/07/17 0715 11/08/17 1115  TROPONINI 0.26* 0.31* 0.27*    Glucose Recent Labs  Lab 11/12/17 1150 11/12/17 1551 11/12/17 2044 11/13/17 0047 11/13/17 0541 11/13/17 0825  GLUCAP 104* 91 99 101* 85 92    Imaging Dg Abd Portable 1v  Result Date: 11/12/2017 CLINICAL DATA:  Nausea,  vomiting. EXAM: PORTABLE ABDOMEN - 1 VIEW COMPARISON:  Radiograph of November 06, 2017. FINDINGS: Distal tip of nasogastric tube is seen in expected position of distal stomach or proximal duodenum. Right-sided ventricular peritoneal shunt is in good position. No abnormal bowel dilatation is noted. IMPRESSION: Distal tip of nasogastric tube seen in expected position of distal stomach or proximal duodenum. No evidence of bowel obstruction or ileus. Electronically Signed   By: Marijo Conception, M.D.   On: 11/12/2017 11:38    STUDIES:  LP 11/27 > clear, gluc 59, RBC 1, color yellow, xanthochromic, protein 276, WBC 4. CT chest 11/27 > Consolidations in the dependent right lower lobe and perifissural left upper lobe and lingula. Cardiomegaly with mild septal thickening/ pulmonary edema. Ground-glass opacities in the left lung and right upper lobe, likely alveolar pulmonary edema, less likely infectious pneumonitis. Small bilateral pleural effusions. Aortic atherosclerosis coronary artery calcifications. Mild emphysema. CT head 11/27 > No acute intracranial abnormality. Unchanged size and configuration of the shunted ventricles. Severe atrophy and white matter disease consistent with chronic ischemic microangiopathy.  CULTURES: CSF 11/27 >> negative Blood 11/27 >> negative  ANTIBIOTICS: Cefepime 11/27 >> CTX 11/27 >> 11/28 Azithro 11/27 >> 11/28 Vanco 11/27 >> 11/30  SIGNIFICANT EVENTS: 11/27  Admit, arrest during HD 12/03  Continues to have poor neuro exam, family meeting with Palliative   LINES/TUBES: 11/27 ETT >>  DISCUSSION: 71 year old male with end-stage renal disease, COPD, CHF, coronary artery disease, dementia admitted with acute encephalopathy, sepsis.  Had a cardiac arrest during dialysis.  Suspect infection complicated by metabolic derangements mediating cardiac arrest.  Course complicated by what looks to be anoxic injury.  Palliative care working with family now.  Suspect will be  working towards one-way extubation at some point  ASSESSMENT / PLAN:   Cardiac Arrest -  PEA 12 mins, ? R/t sepsis and metabolic derangements?  Off pressors Acute on chronic diastolic CHF- LVEF 40-10% w normal systolic function, PAS estimated 38, dilated LA Hx HTN P: ICU monitoring  Continue norvasc, coreg  Tele monitoring  DNR  Acute respiratory failure secondary to cardiac arrest COPD without acute exacerbation (has send RB in past 2016) ? Pulmonary edema - resolved  P: PSV wean  Mental status barrier to extubation  Duoneb TID   Acute metabolic/anoxic encephalopathy. Concern for anoxic injury Dementia Hx stroke Blind EEG noted for diffuse slowing. No seizures P: Palliative care meeting with family  Minimize sedation as able   Likely HCAP Also concern for meningitis on presentation but CSF is not diagnostic  P: ABX as above Cultures as above  ESRD on HD  Mild hypokalemia  P: HD per Nephrology  Replace electrolytes as indicated   GERD Hx GIB Elevated LFTs. ? Shock liver from cardiac arrest  Vomiting - r/o ileus P: PPI  Follow LFT's  Hold TF with vomiting  Anemia Thrombocytopenia  P: Trend CBC  Heparin for DVT prophylaxis     FAMILY  - Updates: DNR.  No family at bedside. Will update on arrival.  Likely family to withdraw care on 12/5.   - Inter-disciplinary family meet or Palliative Care meeting due by:  12/3  CC Time: 38 minutes   [name], NP-C [facility] Pgr: [phone] or if no answer [phone] 11/13/2017, 10:07 AM  Attending Note:  71 year old male with dementia and ESRD-HD who coded in HD.  Patient has made no progress since admission to the ICU from a neurologic standpoint.  On exam, lungs are clear, patient is not following commands.  I reviewed CXR myself, ETT is ok.  Palliative care following.  DNR confirmed.  Plan on withdraw in AM with palliative care involvement.  Appreciate input.  Will d/c all blood draws at  this point.  When family arrives then will plan to withdraw.  The patient is critically ill with multiple organ systems failure and requires high complexity decision making for assessment and support, frequent evaluation and titration of therapies, application of advanced monitoring technologies and extensive interpretation of multiple databases.   Critical Care Time devoted to patient care services described in this note is  35  Minutes. This time reflects time of care of this signee Dr Jennet Maduro. This critical care time does not reflect procedure time, or teaching time or supervisory time of PA/NP/Med student/Med Resident etc but could involve care discussion time.  Rush Farmer, M.D. [facility] Pulmonary/Critical Care Medicine. Pager: [phone]. After hours pager: [phone].

## 2017-11-13 NOTE — Progress Notes (Signed)
Nutrition Brief Note  Chart reviewed. Palliative Care team is following. TF was discontinued due to abdominal distention and vomiting with focus on comfort. Plans for for one way extubation (likely tomorrow) with goal for comfort care.  No further nutrition interventions warranted at this time.  Please re-consult as needed.   Molli Barrows, RD, LDN, [place] Pager [phone] After Hours Pager [phone]

## 2017-11-13 NOTE — Progress Notes (Signed)
[facility] Progress Note   Assessment/ Plan:   1. Cardiac arrest - PEA arrest on 11/27, 12 min code.  2. AMS - not waking up on vent, prob sig anoxic brain injury. Seen by pall care, appreciate assistance.  Family meeting on Wednesday. 3. VDRF - due to #1. pulm edema resolved  4. ESRD MWF HD normally, dialyzing off schedule now TTS.  There is no acute indication for dialysis today and will defer further treatments until after the family meeting on Wednesday. 5. HTN - getting home norvasc and coreg, BP's good 6. Volume - under dry wt, euvol on exam 7. Blindness 8. Dementia/ hx CVA 61. Hx cardiac arrest - in 2016  Subjective:    Family met with palliative care yesterday.  No escalation of care at this point, possible one-way extubation tomorrow.  Little change and remains unresponsive.     Objective:   BP (!) 151/63 (BP Location: Right Wrist)   Pulse 87   Temp 98.7 F (37.1 C)   Resp (!) 21   Ht 5' 3"  (1.6 m)   Wt 57.4 kg (126 lb 8.7 oz)   SpO2 99%   BMI 22.42 kg/m   Physical Exam: GENunresponsive HEENT No jvd or bruits PULM  clear ant and lat CV RRR no MRG ABD soft ntnd no mass or ascites +bs EXT: trace LEedema / no wounds or ulcers Neuro nonresponsive on the vent LUE AVF+bruit    Labs: BMET Recent Labs  Lab 11/06/17 2116  11/08/17 0708 11/08/17 1616 11/09/17 0422 11/10/17 0951 11/11/17 0518 11/12/17 0004 11/13/17 0310  NA 140   < > 138 140 139 137 137 139 138  K 4.5   < > 3.5 3.5 3.2* 4.1 3.6 4.0 4.4  CL 98*   < > 100* 103 101 100* 99* 101 94*  CO2 18*   < > 21* 23 22 25  21* 23 25  GLUCOSE 109*   < > 113* 115* 116* 124* 104* 112* 87  BUN 22*   < > 57* 69* 86* 54* 92* 119* 78*  CREATININE 8.10*   < > 9.72* 10.75* 11.68* 7.30* 8.46* 9.96* 7.39*  CALCIUM 8.7*   < > 8.4* 8.8* 8.5* 8.7* 8.7* 8.5* 9.2  PHOS 7.9*  --  6.9*  --  6.6* 4.8* 6.1* 6.2*  6.3*  --    < > = values in this interval not displayed.   CBC Recent Labs  Lab  11/10/17 0951 11/11/17 0518 11/12/17 0004 11/13/17 0310  WBC 6.6 6.2 6.0 6.4  HGB 8.9* 8.2* 8.2* 9.1*  HCT 30.3* 27.6* 27.2* 29.7*  MCV 96.5 94.5 94.8 94.9  PLT 67* 77* 74* 86*    @IMGRELPRIORS @ Medications:    . amLODipine  5 mg Per Tube Daily  . carvedilol  12.5 mg Per Tube BID WC  . chlorhexidine gluconate (MEDLINE KIT)  15 mL Mouth Rinse BID  . doxercalciferol  5 mcg Intravenous Q M,W,F-HD  . feeding supplement (PRO-STAT SUGAR FREE 64)  30 mL Per Tube BID  . glycopyrrolate  0.2 mg Intravenous Q4H  . heparin  5,000 Units Subcutaneous Q8H  . ipratropium-albuterol  3 mL Nebulization TID  . labetalol  5 mg Intravenous Once  . mouth rinse  15 mL Mouth Rinse QID  . pantoprazole (PROTONIX) IV  40 mg Intravenous Q24H  . sodium chloride flush  3 mL Intravenous Q12H     Madelon Lips MD [facility] pgr [phone] 11/13/2017, 9:03 AM

## 2017-11-14 LAB — BASIC METABOLIC PANEL
Anion gap: 22 — ABNORMAL HIGH (ref 5–15)
BUN: 113 mg/dL — AB (ref 6–20)
CALCIUM: 9.1 mg/dL (ref 8.9–10.3)
CO2: 21 mmol/L — AB (ref 22–32)
Chloride: 96 mmol/L — ABNORMAL LOW (ref 101–111)
Creatinine, Ser: 9.48 mg/dL — ABNORMAL HIGH (ref 0.61–1.24)
GFR calc Af Amer: 6 mL/min — ABNORMAL LOW (ref >60)
GFR calc non Af Amer: 5 mL/min — ABNORMAL LOW (ref >60)
GLUCOSE: 81 mg/dL (ref 65–99)
Potassium: 4.8 mmol/L (ref 3.5–5.1)
Sodium: 139 mmol/L (ref 135–145)

## 2017-11-14 LAB — GLUCOSE, CAPILLARY
GLUCOSE-CAPILLARY: 76 mg/dL (ref 65–99)
Glucose-Capillary: 73 mg/dL (ref 65–99)
Glucose-Capillary: 80 mg/dL (ref 65–99)

## 2017-11-14 LAB — CBC
HCT: 28.9 % — ABNORMAL LOW (ref 39.0–52.0)
Hemoglobin: 8.6 g/dL — ABNORMAL LOW (ref 13.0–17.0)
MCH: 28.5 pg (ref 26.0–34.0)
MCHC: 29.8 g/dL — AB (ref 30.0–36.0)
MCV: 95.7 fL (ref 78.0–100.0)
PLATELETS: 90 10*3/uL — AB (ref 150–400)
RBC: 3.02 MIL/uL — ABNORMAL LOW (ref 4.22–5.81)
RDW: 20.1 % — AB (ref 11.5–15.5)
WBC: 6.1 10*3/uL (ref 4.0–10.5)

## 2017-11-14 MED ORDER — POLYVINYL ALCOHOL 1.4 % OP SOLN
1.0000 [drp] | Freq: Four times a day (QID) | OPHTHALMIC | Status: DC | PRN
  Filled 2017-11-14: qty 15

## 2017-11-14 MED ORDER — BIOTENE DRY MOUTH MT LIQD: 15.0000 mL | OROMUCOSAL | Status: DC | PRN

## 2017-11-14 MED ORDER — MIDAZOLAM HCL 2 MG/2ML IJ SOLN: 1.0000 mg | INTRAMUSCULAR | Status: DC | PRN

## 2017-11-14 MED ORDER — LORAZEPAM 2 MG/ML IJ SOLN: 1.0000 mg | INTRAMUSCULAR | Status: DC | PRN

## 2017-11-14 MED ORDER — FENTANYL CITRATE (PF) 100 MCG/2ML IJ SOLN
50.0000 ug | INTRAMUSCULAR | Status: DC | PRN
  Administered 2017-11-14 – 2017-11-17 (×4): 50 ug via INTRAVENOUS
  Filled 2017-11-14 (×4): qty 2

## 2017-11-14 NOTE — Progress Notes (Signed)
Daily Progress Note   Patient Name: Cameron Mendez       Date: 11/14/2017 DOB: 1946/11/20  Age: 71 y.o. MRN#: [id number] Attending Physician: Marshell Garfinkel, MD Primary Care Physician: [name], MD Admit Date: 10/31/2017  Reason for Consultation/Follow-up: Establishing goals of care  Subjective: Cameron Mendez is unchanged, still unresponsive. Family at bedside.    Length of Stay: 9  Current Medications: Scheduled Meds:  . amLODipine  5 mg Per Tube Daily  . carvedilol  12.5 mg Per Tube BID WC  . chlorhexidine gluconate (MEDLINE KIT)  15 mL Mouth Rinse BID  . doxercalciferol  5 mcg Intravenous Q M,W,F-HD  . feeding supplement (PRO-STAT SUGAR FREE 64)  30 mL Per Tube BID  . glycopyrrolate  0.2 mg Intravenous Q4H  . heparin  5,000 Units Subcutaneous Q8H  . ipratropium-albuterol  3 mL Nebulization TID  . mouth rinse  15 mL Mouth Rinse QID  . pantoprazole (PROTONIX) IV  40 mg Intravenous Q24H  . sodium chloride flush  3 mL Intravenous Q12H    Continuous Infusions: . sodium chloride 10 mL (11/13/17 2029)  . sodium chloride    . sodium chloride    . ceFEPime (MAXIPIME) IV Stopped (11/09/17 1830)  . ferric gluconate (FERRLECIT/NULECIT) IV Stopped (11/09/17 1815)    PRN Meds: sodium chloride, sodium chloride, sodium chloride, acetaminophen **OR** acetaminophen, alteplase, fentaNYL (SUBLIMAZE) injection, heparin, labetalol, lidocaine (PF), lidocaine-prilocaine, midazolam, pentafluoroprop-tetrafluoroeth, sodium chloride flush  Physical Exam         Constitutional: He appears well-developed. He is intubated.  HENT:  Head: Normocephalic and atraumatic.  Cardiovascular: Normal rate.  PVCs  Pulmonary/Chest: Effort normal. No accessory muscle usage. No tachypnea. He is intubated. No  respiratory distress.  Abdominal: Soft. Normal appearance.  Neurological: He is unresponsive.  Nursing note and vitals reviewed.   Vital Signs: BP (!) 144/62   Pulse 81   Temp 98.3 F (36.8 C) (Oral)   Resp 16   Ht 5' 3"  (1.6 m)   Wt 56.4 kg (124 lb 5.4 oz)   SpO2 100%   BMI 22.03 kg/m  SpO2: SpO2: 100 % O2 Device: O2 Device: Ventilator O2 Flow Rate: O2 Flow Rate (L/min): 3 L/min  Intake/output summary:   Intake/Output Summary (Last 24 hours) at 11/14/2017 0840 Last data filed at 11/14/2017 0700  Gross per 24 hour  Intake 280 ml  Output 200 ml  Net 80 ml   LBM: Last BM Date: 11/13/17 Baseline Weight: Weight: 59 kg (130 lb) Most recent weight: Weight: 56.4 kg (124 lb 5.4 oz)       Palliative Assessment/Data: 10%   Flowsheet Rows     Most Recent Value  Intake Tab  Referral Department  Pulmonary  Unit at Time of Referral  Med/Surg Unit  Palliative Care Primary Diagnosis  Cardiac  Date Notified  11/08/17  Palliative Care Type  New Palliative care  Reason for referral  Clarify Goals of Care  Date of Admission  11/09/2017  Date first seen by Palliative Care  11/09/17  # of days Palliative referral response time  1 Day(s)  # of days IP prior to Palliative referral  3  Clinical Assessment  Psychosocial & Spiritual Assessment  Palliative Care Outcomes      Patient Active Problem List   Diagnosis Date Noted  . Anoxic brain damage (HCC)   . Acute respiratory failure ([place])   . Goals of care, counseling/discussion   . Palliative care encounter   . Fever 10/16/2017  . Acute encephalopathy 10/15/2017  . Encephalopathy   . GI bleed 12/15/2016  . Hypokalemia 12/15/2016  . Gastrointestinal hemorrhage   . Symptomatic anemia 11/18/2015  . Acute coronary syndrome ([place]) 09/28/2015  . HCAP (healthcare-associated pneumonia) 09/18/2015  . Cardiac arrest ([place]) 09/13/2015  . Paroxysmal atrial fibrillation ([place]) 03/03/2015  . Tobacco abuse 01/10/2015  . Pleural effusion  01/09/2015  . Chronic diastolic CHF (congestive heart failure) ([place]) 01/08/2015  . History of adenomatous polyp of colon 12/31/2014  . Pancytopenia ([place]) 08/21/2014  . COPD (chronic obstructive pulmonary disease) ([place]) 05/12/2014  . COPD exacerbation ([place]) 04/29/2014  . History of pericardiocentesis 04/27/2014  . Volume overload 04/27/2014  . Anemia 09/18/2013  . Thrombocytopenia ([place]) 09/18/2013  . ESRD on hemodialysis ([place]) 09/18/2013  . Essential hypertension, benign 10/04/2009  . BLINDNESS, [place], [place] DEFINITION 10/01/2009  . OSTEOARTHRITIS 10/01/2009  . Gout, unspecified 09/28/2009    Palliative Care Assessment & Plan   HPI: 71 y.o.malewith past medical history of ESRD, blindness, dementia, previous right parietal ventriculostomy (presumably for prior hydrocephalus) COPD, CAD, LVHwho was admitted from hemodialysis (did not complete) on 11/26/2018with AMS x ~1 week with poor intake and fever and cough. On 11/27 he was sent for HD in the hospital and had PEA arrest; he was resuscitated and ROSC was noted as 12 min. He is currently intubated and has altered mental status. Brain MRI shows severe chronic microvascular changes with parenchymal volume loss. Initial CXR showed pneumonia in RLL (aspiration?) with pulmonary edema as well. Cameron Mendez has remained unresponsive to all stimuli and on ventilator and no sedation on board. Prognosis grim.    Assessment: I met at bedside with Cameron Mendez wife, daughter, sister, and niece. Discussed poor prognosis but that he could live hours or could be days once extubated. Discussed changes in breathing pattern at EOL vs discomfort. RT and RN gathered at bedside and Cameron Mendez successfully extubated to room air. Full comfort care. He appears comfortable and is maintaining respiratory status at this time. Family holding vigil at bedside. Emotional support provided.   Recommendations/Plan:  Full comfort care.   PRN medications liberalized to  maintain comfort.   Goals of Care and Additional Recommendations:  Limitations on Scope of Treatment: Full comfort care  Code Status:  DNR  Prognosis:   Hours - Days  Discharge Planning:  Anticipated Hospital Death  Care plan was discussed with RN, PCCM.    Thank you for allowing the Palliative Medicine Team to assist in the care of this patient.   Total Time 45 min Prolonged Time Billed  no       Greater than 50%  of this time was spent counseling and coordinating care related to the above assessment and plan.  Vinie Sill, NP Palliative Medicine Team Pager # [phone] (M-F 8a-5p) Team Phone # [phone] (Nights/Weekends)

## 2017-11-14 NOTE — Progress Notes (Signed)
Visited with family who is with this patient at end of life.  Spouse and sister of patient is at bedside.  Chaplain provided prayer and ministry of presence.    11/14/17 1247  Clinical Encounter Type  Visited With Patient and family together  Visit Type Initial;Spiritual support;Patient actively dying  Spiritual Encounters  Spiritual Needs Prayer;Grief support

## 2017-11-14 NOTE — Progress Notes (Signed)
  [facility] Progress Note   Assessment/ Plan:   1. Cardiac arrest - PEA arrest on 11/27, 12 min code.  2. AMS - not waking up on vent, prob sig anoxic brain injury. Seen by pall care, appreciate assistance.  Family meeting today 0900. 3. VDRF - due to #1. pulm edema resolved  4. ESRD MWF HD normally, dialyzing off schedule now TTS.  There is no acute indication for dialysis today and will defer further treatments until after the family meeting on Wednesday. 5. HTN - getting home norvasc and coreg, BP's good 6. Volume - under dry wt, euvol on exam 7. Blindness 8. Dementia/ hx CVA 17. Hx cardiac arrest - in 2016 10. Dispo: anticipate transition to comfort care.  Deferring dialysis until after family meetings.  Subjective:    For possible transition to comfort care today.   Objective:   BP (!) 144/62   Pulse 81   Temp 98 F (36.7 C) (Oral)   Resp 16   Ht 5' 3" (1.6 m)   Wt 56.4 kg (124 lb 5.4 oz)   SpO2 100%   BMI 22.03 kg/m   Physical Exam: GENunresponsive HEENT No jvd or bruits, NGT in place with dark-colored secretions PULM  clear ant and lat CV RRR no MRG ABD soft ntnd no mass or ascites +bs EXT: trace LEedema / no wounds or ulcers Neuro nonresponsive on the vent LUE AVF+bruit    Labs: BMET Recent Labs  Lab 11/08/17 0708 11/08/17 1616 11/09/17 0422 11/10/17 0951 11/11/17 0518 11/12/17 0004 11/13/17 0310 11/14/17 0254  NA 138 140 139 137 137 139 138 139  K 3.5 3.5 3.2* 4.1 3.6 4.0 4.4 4.8  CL 100* 103 101 100* 99* 101 94* 96*  CO2 21* _0 21* 23 25 21*  GLUCOSE 113* 115* 116* 124* 104* 112* 87 81  BUN 57* 69* 86* 54* 92* 119* 78* 113*  CREATININE 9.72* 10.75* 11.68* 7.30* 8.46* 9.96* 7.39* 9.48*  CALCIUM 8.4* 8.8* 8.5* 8.7* 8.7* 8.5* 9.2 9.1  PHOS 6.9*  --  6.6* 4.8* 6.1* 6.2*  6.3*  --   --    CBC Recent Labs  Lab 11/11/17 0518 11/12/17 0004 11/13/17 0310 11/14/17 0254  WBC 6.2 6.0 6.4 6.1  HGB 8.2* 8.2* 9.1* 8.6*  HCT  27.6* 27.2* 29.7* 28.9*  MCV 94.5 94.8 94.9 95.7  PLT 77* 74* 86* 90*    _1 @ Medications:    . chlorhexidine gluconate (MEDLINE KIT)  15 mL Mouth Rinse BID  . glycopyrrolate  0.2 mg Intravenous Q4H  . mouth rinse  15 mL Mouth Rinse QID  . sodium chloride flush  3 mL Intravenous Q12H     Madelon Lips MD [facility] pgr [phone] 11/14/2017, 9:25 AM

## 2017-11-14 NOTE — Progress Notes (Signed)
Patient extubated per orders "Withdrawal of Life Sustaining Treatment" to Room Air. Family at bedside. RN at bedside.

## 2017-11-14 NOTE — Progress Notes (Signed)
Patient extubated with RN and RT at bedside. Appears comfortable and in no distress. Family at bedside. RN will continue to monitor for signs of discomfort & distress.

## 2017-11-14 NOTE — Progress Notes (Signed)
Palliative Medicine RN Note: Symptom check following extubation. Wife, sister, brother at bedside. Pt is relaxed, without facial grimacing or body tension, breathing is even and unlabored. Discussed pt's comfort with family and signs to look for distress (grimacing, fast breathing, body tension, groaning). Family verbalized understanding. PMT will continue to monitor during the course of the day.  Marjie Skiff Divon Krabill, RN, BSN, [facility] 11/14/2017 10:05 AM Cell [phone] 8:00-4:00 Monday-Friday Office [phone]

## 2017-11-14 NOTE — Progress Notes (Addendum)
Palliative Medicine RN Note: Wife, sister, daughter at bedside. Patient now on 6N. He has required one PRN dose of fentanyl since this morning. He is relaxed with very shallow breathing (it was difficult to determine that he was even breathing). Patient's respiratory status is very fragile, and our team is concerned that he is not stable for other transfers.   Reinforced teaching re when to call the RN. Plan for PMT follow up tomorrow.  Marjie Skiff Ily Denno, RN, BSN, [facility] 11/14/2017 2:54 PM Cell [phone] 8:00-4:00 Monday-Friday Office [phone]

## 2017-11-14 NOTE — Progress Notes (Signed)
PULMONARY / CRITICAL CARE MEDICINE   Name: Cameron Mendez MRN: [id number] DOB: 1946/06/26    ADMISSION DATE:  10/22/2017 CONSULTATION DATE:  11/06/2016   REFERRING MD:  Dr. Wendee Beavers  CHIEF COMPLAINT:  11/06/2017  HISTORY OF PRESENT ILLNESS:   71 year old male with past medical history as below, which is significant for end-stage renal disease on dialysis, COPD, CHF, coronary artery disease, history of cardiac arrest, dementia, blindness, and history of stroke.  He was recently admitted to [facility] with acute encephalopathy which was poorly defined but did seem to improve and he was discharged home with a palliative care referral.  He presented again to [facility] on 11/27 with complaints of altered mental status and is accompanied by his wife.  There was some concern for infection including meningitis so he is covered with Rocephin and also azithromycin with concern for pneumonia.  Lumbar puncture was performed in the emergency department as well.  Due to this acute illness he did miss dialysis and was seen by nephrology as an inpatient.  They felt as though he is volume overloaded and he was transferred to the dialysis unit for HD.  During his dialysis treatment after about 2.5 L was removed he suffered a cardiac arrest and was PEA.  He was resuscitated for a total of 12 minutes.  PCCM has been asked to evaluate.  SUBJECTIVE:  Palliative NP reports family transitioning to comfort care.     VITAL SIGNS: BP (!) 144/62   Pulse 81   Temp 98 F (36.7 C) (Oral)   Resp 16   Ht 5\' 3"  (1.6 m)   Wt 124 lb 5.4 oz (56.4 kg)   SpO2 100%   BMI 22.03 kg/m   HEMODYNAMICS:    VENTILATOR SETTINGS: Vent Mode: PRVC FiO2 (%):  [30 %] 30 % Set Rate:  [16 bmp] 16 bmp Vt Set:  [550 mL] 550 mL PEEP:  [5 cmH20] 5 cmH20 Plateau Pressure:  [10 [id number] cmH20] 23 cmH20  INTAKE / OUTPUT: I/O last 3 completed shifts: In: 510 [I.V.:360; NG/GT:150] Out: 450 [Emesis/NG  output:450]  PHYSICAL EXAMINATION: General: elderly male in NAD, appears peaceful  HEENT: MM pink/dry Neuro: obtunded CV: s1s2 rrr, no m/r/g PULM: even/non-labored, lungs bilaterally clear  JY:NWGN, non-tender, bsx4 active  Extremities: warm/dry, no edema  Skin: no rashes or lesions  LABS:  BMET Recent Labs  Lab 11/12/17 0004 11/13/17 0310 11/14/17 0254  NA 139 138 139  K 4.0 4.4 4.8  CL 101 94* 96*  CO2 23 25 21*  BUN 119* 78* 113*  CREATININE 9.96* 7.39* 9.48*  GLUCOSE 112* 87 81    Electrolytes Recent Labs  Lab 11/10/17 0951 11/11/17 0518 11/12/17 0004 11/13/17 0310 11/14/17 0254  CALCIUM 8.7* 8.7* 8.5* 9.2 9.1  MG 2.3 2.3 2.3  --   --   PHOS 4.8* 6.1* 6.2*  6.3*  --   --     CBC Recent Labs  Lab 11/12/17 0004 11/13/17 0310 11/14/17 0254  WBC 6.0 6.4 6.1  HGB 8.2* 9.1* 8.6*  HCT 27.2* 29.7* 28.9*  PLT 74* 86* 90*    Coag's No results for input(s): APTT, INR in the last 168 hours.  Sepsis Markers Recent Labs  Lab 11/08/17 0708 11/08/17 1115  LATICACIDVEN  --  2.6*  PROCALCITON >150.00  --     ABG Recent Labs  Lab 11/09/17 0359  PHART 7.489*  PCO2ART 30.0*  PO2ART 112*    Liver Enzymes Recent  Labs  Lab 11/08/17 1616 11/09/17 0422 11/12/17 0004  AST 499* 378*  --   ALT 228* 181*  --   ALKPHOS 66 62  --   BILITOT 0.8 0.7  --   ALBUMIN 2.5* 2.4* 2.1*    Cardiac Enzymes Recent Labs  Lab 11/08/17 1115  TROPONINI 0.27*    Glucose Recent Labs  Lab 11/13/17 1152 11/13/17 1542 11/13/17 2021 11/14/17 0013 11/14/17 0348 11/14/17 0817  GLUCAP 96 92 103* 76 73 80    Imaging No results found.  STUDIES:  LP 11/27 > clear, gluc 59, RBC 1, color yellow, xanthochromic, protein 276, WBC 4. CT chest 11/27 > Consolidations in the dependent right lower lobe and perifissural left upper lobe and lingula. Cardiomegaly with mild septal thickening/ pulmonary edema. Ground-glass opacities in the left lung and right upper lobe,  likely alveolar pulmonary edema, less likely infectious pneumonitis. Small bilateral pleural effusions. Aortic atherosclerosis coronary artery calcifications. Mild emphysema. CT head 11/27 > No acute intracranial abnormality. Unchanged size and configuration of the shunted ventricles. Severe atrophy and white matter disease consistent with chronic ischemic microangiopathy.  CULTURES: CSF 11/27 >> negative Blood 11/27 >> negative  ANTIBIOTICS: Cefepime 11/27 >> CTX 11/27 >> 11/28 Azithro 11/27 >> 11/28 Vanco 11/27 >> 11/30  SIGNIFICANT EVENTS: 11/27  Admit, arrest during HD 12/03  Continues to have poor neuro exam, family meeting with Palliative  12/05  Transitioned to comfort care   LINES/TUBES: 11/27 ETT >>  DISCUSSION: 71 year old male with end-stage renal disease, COPD, CHF, coronary artery disease, dementia admitted with acute encephalopathy, sepsis.  Had a cardiac arrest during dialysis.  Suspect infection complicated by metabolic derangements mediating cardiac arrest.  Course complicated by what looks to be anoxic injury.  Palliative care working with family now.  Suspect will be working towards one-way extubation at some point  ASSESSMENT / PLAN:   Cardiac Arrest -  PEA 12 mins, ? R/t sepsis and metabolic derangements?  Off pressors Acute on chronic diastolic CHF- LVEF 44-31% w normal systolic function, PAS estimated 38, dilated LA Hx HTN P: Comfort care  Discontinue all measures that do not add to comfort  DNR  Acute respiratory failure secondary to cardiac arrest COPD without acute exacerbation (has send RB in past 2016) ? Pulmonary edema - resolved  P: Comfort extubation  PRN fentanyl for pain Robinul for secretions  PRN versed for sedation / agitation  Continue oral care  Acute metabolic/anoxic encephalopathy. Concern for anoxic injury Dementia Hx stroke Blind EEG noted for diffuse slowing. No seizures P: Palliative support  Likely HCAP Also concern for  meningitis on presentation but CSF is not diagnostic  P: Discontinue abx  ESRD on HD Mild hypokalemia  P: Hold further HD No further labs  GERD Hx GIB Elevated LFTs. ? Shock liver from cardiac arrest  Vomiting - r/o ileus P: Oral care  No further labs  Anemia Thrombocytopenia  P: Hold further labs    FAMILY  - Updates: Family support offered at bedside.  No further aggressive measures.  Full comfort care.  Appreciate Palliative care involvement.  Discussed with family, will transition out of ICU to allow for family comfort.    [name], NP-C [facility] Pgr: [phone] or if no answer [phone] 11/14/2017, 10:14 AM   Attending Note:  71 year old male s/p cardiac arrest and severe anoxic injury who is now comfort care per family's wishes.  On exam, patient is unresponsive with clear lungs.  I reviewed CXR myself, ETT is in good position.  Proceed with comfort care.  Appreciate input from palliative care.  Patient now appears comfortable on morphine and is extubated.  Will transfer to palliative care floor and to [facility] service with PCCM off 12/6.  The patient is critically ill with multiple organ systems failure and requires high complexity decision making for assessment and support, frequent evaluation and titration of therapies, application of advanced monitoring technologies and extensive interpretation of multiple databases.   Critical Care Time devoted to patient care services described in this note is  35  Minutes. This time reflects time of care of this signee Dr Jennet Maduro. This critical care time does not reflect procedure time, or teaching time or supervisory time of PA/NP/Med student/Med Resident etc but could involve care discussion time.  Rush Farmer, M.D. [facility]. Pager: [phone]. After hours pager: [phone].

## 2017-11-15 DIAGNOSIS — R509 Fever, unspecified: Secondary | ICD-10-CM

## 2017-11-15 DIAGNOSIS — Z992 Dependence on renal dialysis: Secondary | ICD-10-CM

## 2017-11-15 DIAGNOSIS — N186 End stage renal disease: Secondary | ICD-10-CM

## 2017-11-15 NOTE — Progress Notes (Signed)
Chart reviewed.  Now comfort care.  No further dialysis.  We will sign off.    Please re-consult as needed.  Madelon Lips MD [facility] pgr [phone]

## 2017-11-15 NOTE — Progress Notes (Signed)
[facility]                                                                              Patient Demographics  Cameron Mendez, is a 71 y.o. male, DOB - 02-08-1946, LNL:[id number]  Admit date - 10/11/2017   Admitting Physician Marshell Garfinkel, MD  Outpatient Primary MD for the patient is [name], MD  Outpatient specialists:   LOS - 10  days   Medical records reviewed and are as summarized below:    Chief Complaint  Patient presents with  . Weakness       Brief summary   71 year old male with past medical history as below, which is significant for end-stage renal disease on dialysis, COPD, CHF, coronary artery disease, history of cardiac arrest, dementia, blindness, and history of stroke.  He was recently admitted to [facility] with acute encephalopathy which was poorly defined but did seem to improve and he was discharged home with a palliative care referral.  He presented again to [facility] on 11/27 with complaints of altered mental status and is accompanied by his wife.  There was some concern for infection including meningitis so he is covered with Rocephin and also azithromycin with concern for pneumonia.  Lumbar puncture was performed in the emergency department as well.  Due to this acute illness he did miss dialysis and was seen by nephrology as an inpatient.  They felt as though he is volume overloaded and he was transferred to the dialysis unit for HD.  During his dialysis on 11/27, after about 2.5 L was removed he suffered a cardiac arrest and was PEA.  He was resuscitated for a total of 12 minutes.    Patient was admitted to ICU, intubated.  Family discussion was held by [facility] CCM and palliative medicine, patient was extubated after comfort care goals were established. [facility] hospitalist service assumed care on 12/6    Assessment & Plan    Principal Problem: Cardiac arrest with PEA 12 minutes -Possibly due to sepsis or metabolic  derangements, acute on chronic diastolic CHF. -During his dialysis on 11/27, after about 2.5 L were removed, patient suffered a cardiac arrest and PEA. 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  Active Problems: Acute hypoxic respiratory failure following cardiac arrest, also acute on chronic diastolic CHF -2D echo showed EF of 55-60% with normal systolic function.  Dilated LA -Patient was intubated on 11/27, after comfort care goals were established, extubated on 40/8  Acute metabolic/anoxic encephalopathy in the setting of dementia, history of stroke -Concern for anoxic injury -EEG showed diffuse slowing, no seizures -Currently comfort care goals    ESRD on hemodialysis ([place]) -Due to comfort care goals, hemodialysis has been discontinued per family's wishes  Possible H CAP -Antibiotics have been discontinued due to comfort care goals.  There was a concern for meningitis on presentation however CSF was nondiagnostic.  History of GI bleed, transaminitis -Likely due to shock liver  from cardiac arrest -No further labs  Anemia/thrombocytopenia -Comfort care goals, no further labs   Code Status: Comfort care DVT Prophylaxis:   Family Communication: No family member at the bedside  Disposition Plan: Social work consult placed for residential hospice  Time Spent in minutes 25 minutes  Procedures:  Intubation, extubation Hemodialysis  Consultants:   PCCM Palliative   Antimicrobials:      Medications  Scheduled Meds: . chlorhexidine gluconate (MEDLINE KIT)  15 mL Mouth Rinse BID  . glycopyrrolate  0.2 mg Intravenous Q4H  . mouth rinse  15 mL Mouth Rinse QID  . sodium chloride flush  3 mL Intravenous Q12H   Continuous Infusions: . sodium chloride 10 mL  (11/13/17 2029)   PRN Meds:.sodium chloride, antiseptic oral rinse, fentaNYL (SUBLIMAZE) injection, LORazepam, polyvinyl alcohol, sodium chloride flush   Antibiotics   Anti-infectives (From admission, onward)   Start     Dose/Rate Route Frequency Ordered Stop   11/10/17 2000  ceFEPIme (MAXIPIME) 1 g in dextrose 5 % 50 mL IVPB     1 g 100 mL/hr over 30 Minutes Intravenous  Once 11/10/17 1026 11/10/17 2044   11/09/17 2000  ceFEPIme (MAXIPIME) 500 mg in dextrose 5 % 50 mL IVPB     500 mg 100 mL/hr over 30 Minutes Intravenous  Once 11/09/17 1710 11/09/17 2127   11/07/17 1200  vancomycin (VANCOCIN) 500 mg in sodium chloride 0.9 % 100 mL IVPB  Status:  Discontinued     500 mg 100 mL/hr over 60 Minutes Intravenous Every M-W-F (Hemodialysis) 11/06/17 0253 11/10/17 1018   11/06/17 0300  vancomycin (VANCOCIN) IVPB 1000 mg/200 mL premix     1,000 mg 200 mL/hr over 60 Minutes Intravenous  Once 11/06/17 0253 11/06/17 0632   11/06/17 0300  ceFEPIme (MAXIPIME) 2 g in dextrose 5 % 50 mL IVPB  Status:  Discontinued     2 g 100 mL/hr over 30 Minutes Intravenous Every M-W-F (1800) 11/06/17 0253 11/14/17 0913   10/16/2017 2330  cefTRIAXone (ROCEPHIN) 2 g in dextrose 5 % 50 mL IVPB     2 g 100 mL/hr over 30 Minutes Intravenous  Once 10/18/2017 2326 11/06/17 0328   10/18/2017 2330  azithromycin (ZITHROMAX) 500 mg in dextrose 5 % 250 mL IVPB     500 mg 250 mL/hr over 60 Minutes Intravenous  Once 10/13/2017 2326 11/06/17 0328        Subjective:   Cameron Mendez was seen and examined today.  Somnolent, comfortable    Objective:   Vitals:   11/14/17 1100 11/14/17 1200 11/14/17 1328 11/15/17 0512  BP:   (!) 150/47 (!) 142/56  Pulse: 86 82 81 71  Resp: _0 (!) 22  Temp:   97.6 F (36.4 C) 98.4 F (36.9 C)  TempSrc:   Oral Oral  SpO2: 94% 94% 97% 94%  Weight:      Height:        Intake/Output Summary (Last 24 hours) at 11/15/2017 1034 Last data filed at 11/15/2017 0546 Gross per 24 hour  Intake  0 ml  Output -  Net 0 ml     Wt Readings from Last 3 Encounters:  11/14/17 56.4 kg (124 lb 5.4 oz)  10/16/17 60.6 kg (133 lb 11.2 oz)  01/04/17 61.2 kg (135 lb)     Exam  General: Somnolent, does not follow any commands  Eyes:   HEENT:    Cardiovascular: S1 S2 auscultated  Respiratory: Lungs clear bilaterally  Gastrointestinal: Soft, nontender,  nondistended, + bowel sounds  Ext: no pedal edema bilaterally  Neuro: unable to assess  Musculoskeletal: No digital cyanosis, clubbing  Skin: No rashes  Psych: unable to assess   Data Reviewed:  I have personally reviewed following labs and imaging studies  Micro Results Recent Results (from the past 240 hour(s))  Blood culture (routine x 2)     Status: None   Collection Time: 11/03/2017  7:43 PM  Result Value Ref Range Status   Specimen Description BLOOD RIGHT HAND  Final   Special Requests IN PEDIATRIC BOTTLE Blood Culture adequate volume  Final   Culture NO GROWTH 5 DAYS  Final   Report Status 11/10/2017 FINAL  Final  Blood culture (routine x 2)     Status: None   Collection Time: 10/21/2017  8:13 PM  Result Value Ref Range Status   Specimen Description BLOOD LEFT ARM  Final   Special Requests   Final    BOTTLES DRAWN AEROBIC AND ANAEROBIC Blood Culture adequate volume   Culture NO GROWTH 5 DAYS  Final   Report Status 11/10/2017 FINAL  Final  CSF culture     Status: None   Collection Time: 11/06/17 10:30 AM  Result Value Ref Range Status   Specimen Description CSF  Final   Special Requests NONE  Final   Gram Stain   Final    CYTOSPIN SMEAR WBC PRESENT,BOTH PMN AND MONONUCLEAR NO ORGANISMS SEEN    Culture NO GROWTH 3 DAYS  Final   Report Status 11/09/2017 FINAL  Final  MRSA PCR Screening     Status: None   Collection Time: 11/06/17  6:38 PM  Result Value Ref Range Status   MRSA by PCR NEGATIVE NEGATIVE Final    Comment:        The GeneXpert MRSA Assay (FDA approved for NASAL specimens only), is one  component of a comprehensive MRSA colonization surveillance program. It is not intended to diagnose MRSA infection nor to guide or monitor treatment for MRSA infections.     Radiology Reports Dg Chest 2 View  Result Date: 11/02/2017 CLINICAL DATA:  Cough, altered mental status EXAM: CHEST  2 VIEW COMPARISON:  10/26/2017 FINDINGS: Cardiomegaly with vascular congestion and bilateral interstitial and alveolar opacities most compatible with edema. Small effusions. No acute bony abnormality. VP shunt catheter noted in the right chest wall. IMPRESSION: Mild edema/ CHF.  Small effusions. Electronically Signed   By: Rolm Baptise M.D.   On: 10/28/2017 16:57   Dg Chest 2 View  Result Date: 10/26/2017 CLINICAL DATA:  Weakness EXAM: CHEST  2 VIEW COMPARISON:  05/18/2010 FINDINGS: Cardiac shadow is enlarged but stable. Shunt catheter is again noted on the right. Aortic calcifications are again seen and stable. The lungs are well aerated bilaterally without focal confluent infiltrate. Small bilateral pleural effusions are noted. These have increased slightly in the interval from the prior exam. Mild central vascular congestion is noted likely related to a component of fluid overload. IMPRESSION: Mild vascular congestion small effusions. A portion of this is likely related to some volume overload given the patient's clinical history. Electronically Signed   By: Inez Catalina M.D.   On: 10/26/2017 17:55   Dg Chest 2 View  Result Date: 10/16/2017 CLINICAL DATA:  Acute encephalopathy, end-stage renal disease on dialysis. Paroxysms mole atrial fibrillation. History of COPD. EXAM: CHEST  2 VIEW COMPARISON:  Portable chest x-ray of October 15, 2017 FINDINGS: The lungs are well-expanded. The interstitial markings remain mildly increased. There is  no alveolar infiltrate. There is no pleural effusion. The cardiac silhouette remains enlarged. The pulmonary vascularity remains engorged but is more distinct. There is  calcification in the wall of the thoracic aorta. A ventriculoperitoneal shunt tube is present. IMPRESSION: CHF with slightly decreased pulmonary vascular congestion. No pneumonia. Thoracic aortic atherosclerosis. Electronically Signed   By: David  Martinique M.D.   On: 10/16/2017 14:20   Ct Head Wo Contrast  Result Date: 10/24/2017 CLINICAL DATA:  Altered mental status at dialysis EXAM: CT HEAD WITHOUT CONTRAST TECHNIQUE: Contiguous axial images were obtained from the base of the skull through the vertex without intravenous contrast. COMPARISON:  CT 10/15/2017 FINDINGS: Brain: There is a right parietal approach shunt catheter with tip in the right lateral ventricle. The size and configuration of the ventricles are unchanged. There is no acute hemorrhage or evidence of acute cortical infarct. Prominent extra-axial CSF spaces are unchanged. There is severe periventricular hypoattenuation consistent with chronic small vessel ischemia. No midline shift or other mass effect. Old bilateral basal ganglia lacunar infarcts. Vascular: No hyperdense vessel or unexpected calcification. Skull: Normal visualized skull base, calvarium and extracranial soft tissues. Sinuses/Orbits: Bilateral maxillary retention cysts. Normal orbits. IMPRESSION: 1. No acute intracranial abnormality. 2. Unchanged size and configuration of the shunted ventricles. 3. Severe atrophy and white matter disease consistent with chronic ischemic microangiopathy. Electronically Signed   By: Ulyses Jarred M.D.   On: 10/17/2017 21:55   Ct Chest Wo Contrast  Result Date: 11/06/2017 CLINICAL DATA:  Fever of unknown origin.  Pleural effusion. EXAM: CT CHEST WITHOUT CONTRAST TECHNIQUE: Multidetector CT imaging of the chest was performed following the standard protocol without IV contrast. COMPARISON:  Radiographs yesterday. FINDINGS: Cardiovascular: Dense aortic atherosclerosis, normal caliber aorta. Multi chamber cardiomegaly with coronary artery  calcifications. Minimal pericardial fluid. Mediastinum/Nodes: Shotty mediastinal nodes, increased in number abuts subcentimeter. The esophagus is decompressed. Visualized thyroid gland is normal. Lungs/Pleura: Mild emphysema. Mild septal thickening consistent pulmonary edema. Patchy ground-glass opacities throughout the left lung and perihilar right upper lobe. More consolidative opacities in the lingula and left upper lobe abutting the fissure and dependent right lower lobe. There is bronchial thickening. No definite endotracheal or endobronchial debris. Small bilateral pleural effusions, greater on the left. Calcified granuloma in the left upper lobe. Upper Abdomen: Upper normal spleen size. Vascular calcifications of abdominal vasculature. Atrophic kidneys. Musculoskeletal: Shunt catheter tubing in the right anterior chest wall. Degenerative change in the spine. There are no acute or suspicious osseous abnormalities. IMPRESSION: 1. Consolidations in the dependent right lower lobe and perifissural left upper lobe and lingula, may be atelectasis, pneumonia, or possibly aspiration. 2. Cardiomegaly with mild septal thickening/ pulmonary edema. Ground-glass opacities in the left lung and right upper lobe, likely alveolar pulmonary edema, less likely infectious pneumonitis. Small bilateral pleural effusions. 3. Aortic atherosclerosis coronary artery calcifications. Mild emphysema. Aortic Atherosclerosis (ICD10-I70.0) and Emphysema (ICD10-J43.9). Electronically Signed   By: Jeb Levering M.D.   On: 11/06/2017 02:55   Mr Brain Wo Contrast  Result Date: 11/06/2017 CLINICAL DATA:  71 y/o  M; patient coded during dialysis. EXAM: MRI HEAD WITHOUT CONTRAST TECHNIQUE: Multiplanar, multiecho pulse sequences of the brain and surrounding structures were obtained without intravenous contrast. COMPARISON:  10/12/2017 CT of the head. FINDINGS: Brain: No acute infarction, hemorrhage, hydrocephalus, extra-axial collection or  mass lesion. Diffuse nonspecific T2 FLAIR hyperintense signal abnormality of white matter compatible with severe chronic microvascular ischemic changes. Severe brain parenchymal volume loss and diffuse prominent perivascular spaces. Small hemosiderin stained chronic lacunar  infarctions within the caudate heads bilaterally. Stable foci of chronic microhemorrhage within the left putamen and right splenium of corpus callosum. Right parietal approach ventriculostomy catheter with tip in the right lateral ventricle. No hydrocephalus. Vascular: Loss of normal flow void in the right transverse sinus. Skull and upper cervical spine: Normal marrow signal. Sinuses/Orbits: Fluid and debris within the oropharynx. Multiple mucous retention cysts within the maxillary sinuses bilaterally and mild diffuse paranasal sinus mucosal thickening. No abnormal signal of mastoid air cells. Bilateral intra-ocular lens replacement. Other: None. IMPRESSION: 1. No acute/early subacute infarct, hemorrhage, or focal mass effect. 2. Severe chronic microvascular ischemic changes and parenchymal volume loss of the brain. Small chronic lacunar infarcts in the basal ganglia bilaterally. 3. Right parietal approach ventriculostomy catheter. No hydrocephalus. 4. Mild paranasal sinus disease. 5. Loss of normal flow void in right transverse sinus, probably slow flow, less likely thrombosis. This can be further assessed with CT or MRI venogram of the head. These results will be called to the ordering clinician or representative by the Radiologist Assistant, and communication documented in the PACS or zVision Dashboard. Electronically Signed   By: Kristine Garbe M.D.   On: 11/06/2017 22:41   Dg Chest Port 1 View  Result Date: 11/12/2017 CLINICAL DATA:  Acute respiratory failure. EXAM: PORTABLE CHEST 1 VIEW COMPARISON:  11/09/2017. FINDINGS: Mild cardiac enlargement is stable. Support tubes and lines including ETT, and nasogastric tube grossly  stable. Ventriculoperitoneal shunt tubing overlies the RIGHT chest, with some apparent discontinuity of the more lateral tube, likely abandoned revision. There is no consolidation or edema. No pneumothorax is evident. IMPRESSION: Mild cardiac enlargement. No consolidation or edema. Support tubes and lines appear stable. Electronically Signed   By: Staci Righter M.D.   On: 11/12/2017 07:13   Dg Chest Port 1 View  Result Date: 11/09/2017 CLINICAL DATA:  Acute respiratory failure. EXAM: PORTABLE CHEST 1 VIEW COMPARISON:  Radiograph of November 08, 2017. FINDINGS: Stable cardiomediastinal silhouette. Endotracheal and nasogastric tubes are unchanged in position. No pneumothorax or pleural effusion is noted. Right sided ventriculoperitoneal shunt is again noted. No pneumothorax or pleural effusion is noted. Bony thorax is unremarkable. IMPRESSION: Stable support apparatus. No acute cardiopulmonary abnormality seen. Electronically Signed   By: Marijo Conception, M.D.   On: 11/09/2017 09:19   Dg Chest Port 1 View  Result Date: 11/08/2017 CLINICAL DATA:  Acute respiratory failure EXAM: PORTABLE CHEST 1 VIEW COMPARISON:  11/07/2017 FINDINGS: Endotracheal tube in good position. Gastric tube enters the stomach with the tip not visualized. VP shunt tubing overlying the right chest unchanged Right lower lobe airspace disease with mild progression. Possible pneumonia or atelectasis. Negative for heart failure or effusion IMPRESSION: Endotracheal tube remains in good position Mild progression of right lower lobe atelectasis/ pneumonia. Electronically Signed   By: Franchot Gallo M.D.   On: 11/08/2017 06:57   Dg Chest Port 1 View  Result Date: 11/07/2017 CLINICAL DATA:  71 year old male with fever of unknown origin. Cough and altered mental status. EXAM: PORTABLE CHEST 1 VIEW COMPARISON:  11/06/2017 CT, radiographs, and earlier. FINDINGS: Portable AP semi upright view at 0427 hours. Endotracheal tube tip in good position  between the clavicles and carina. Enteric tube courses to the abdomen, tip not included. Right neck, chest and abdomen shunt catheter re- demonstrated. Stable cardiomegaly and mediastinal contours. No pneumothorax. Regressed pulmonary vascularity since 10/18/2017. Patchy right lung base consolidation and pleural effusions are not evident today. Mildly improved perihilar ventilation since 11/06/2017. No areas of worsening  ventilation. Paucity of upper abdominal bowel gas. IMPRESSION: 1.  Stable lines and tubes. 2. Regressed pulmonary edema since 10/24/2017. Right lower lobe consolidation and left greater than right pleural effusions seen by CT yesterday are not evident. No areas of worsening ventilation. Electronically Signed   By: Genevie Ann M.D.   On: 11/07/2017 06:51   Dg Chest Port 1 View  Result Date: 11/06/2017 CLINICAL DATA:  Intubation. EXAM: PORTABLE CHEST 1 VIEW COMPARISON:  11/06/2017 chest CT and 10/19/2017 chest radiograph FINDINGS: An endotracheal tube with tip 2.5 cm above the carina and NG tube with tip overlying the mid stomach noted. Cardiomegaly with pulmonary vascular congestion again identified. Bibasilar atelectasis, right greater than left noted. The left pleural effusion identified on today's CT is difficult to visualize radiographically. There is no evidence of pneumothorax. Ventriculoperitoneal shunt catheters are again noted. IMPRESSION: Endotracheal tube with tip 2.5 cm above the carina and NG tube with tip overlying the mid stomach. Cardiomegaly, pulmonary vascular congestion and bibasilar atelectasis, right greater than left. Electronically Signed   By: Margarette Canada M.D.   On: 11/06/2017 19:38   Dg Abd Portable 1v  Result Date: 11/12/2017 CLINICAL DATA:  Nausea, vomiting. EXAM: PORTABLE ABDOMEN - 1 VIEW COMPARISON:  Radiograph of November 06, 2017. FINDINGS: Distal tip of nasogastric tube is seen in expected position of distal stomach or proximal duodenum. Right-sided ventricular  peritoneal shunt is in good position. No abnormal bowel dilatation is noted. IMPRESSION: Distal tip of nasogastric tube seen in expected position of distal stomach or proximal duodenum. No evidence of bowel obstruction or ileus. Electronically Signed   By: Marijo Conception, M.D.   On: 11/12/2017 11:38   Dg Abd Portable 1v  Result Date: 11/06/2017 CLINICAL DATA:  NG tube placement. EXAM: PORTABLE ABDOMEN - 1 VIEW COMPARISON:  None. FINDINGS: An NG tube is identified with tip overlying the mid stomach. VP shunt catheter again noted. Bowel gas pattern is unremarkable. IMPRESSION: NG tube with tip overlying the mid stomach. Electronically Signed   By: Margarette Canada M.D.   On: 11/06/2017 19:38   Dg Fluoro Guide Lumbar Puncture  Result Date: 11/06/2017 CLINICAL DATA:  Altered mental status.  Concern for meningitis. EXAM: DIAGNOSTIC LUMBAR PUNCTURE UNDER FLUOROSCOPIC GUIDANCE FLUOROSCOPY TIME:  Radiation Exposure Index (as provided by the fluoroscopic device): 11.4 mGy If the device does not provide the exposure index: Fluoroscopy Time (in minutes and seconds):  1 minutes 36 seconds Number of Acquired Images:  1 PROCEDURE: Informed consent was obtained from the patient prior to the procedure, including potential complications of headache, allergy, and pain. With the patient prone, the lower back was prepped with Betadine. 1% Lidocaine was used for local anesthesia. Lumbar puncture was performed at the L3-L4 level using a 20 gauge needle with return of straw-colored CSF with an estimated opening pressure of 15 cm water. 8 ml of CSF were obtained for laboratory studies. The patient tolerated the procedure well and there were no apparent complications. IMPRESSION: Successful lumbar puncture with straw-colored CSF. Electronically Signed   By: Suzy Bouchard M.D.   On: 11/06/2017 11:29    Lab Data:  CBC: Recent Labs  Lab 11/10/17 0951 11/11/17 0518 11/12/17 0004 11/13/17 0310 11/14/17 0254  WBC 6.6 6.2 6.0  6.4 6.1  HGB 8.9* 8.2* 8.2* 9.1* 8.6*  HCT 30.3* 27.6* 27.2* 29.7* 28.9*  MCV 96.5 94.5 94.8 94.9 95.7  PLT 67* 77* 74* 86* 90*   Basic Metabolic Panel: Recent Labs  Lab 11/09/17  0422 11/10/17 0951 11/11/17 0518 11/12/17 0004 11/13/17 0310 11/14/17 0254  NA 139 137 137 139 138 139  K 3.2* 4.1 3.6 4.0 4.4 4.8  CL 101 100* 99* 101 94* 96*  CO2 22 25 21* 23 25 21*  GLUCOSE 116* 124* 104* 112* 87 81  BUN 86* 54* 92* 119* 78* 113*  CREATININE 11.68* 7.30* 8.46* 9.96* 7.39* 9.48*  CALCIUM 8.5* 8.7* 8.7* 8.5* 9.2 9.1  MG 2.9* 2.3 2.3 2.3  --   --   PHOS 6.6* 4.8* 6.1* 6.2*  6.3*  --   --    GFR: Estimated Creatinine Clearance: 5.7 mL/min (A) (by C-G formula based on SCr of 9.48 mg/dL (H)). Liver Function Tests: Recent Labs  Lab 11/08/17 1616 11/09/17 0422 11/12/17 0004  AST 499* 378*  --   ALT 228* 181*  --   ALKPHOS 66 62  --   BILITOT 0.8 0.7  --   PROT 6.9 6.8  --   ALBUMIN 2.5* 2.4* 2.1*   No results for input(s): LIPASE, AMYLASE in the last 168 hours. Recent Labs  Lab 11/08/17 1115  AMMONIA 39*   Coagulation Profile: No results for input(s): INR, PROTIME in the last 168 hours. Cardiac Enzymes: Recent Labs  Lab 11/08/17 1115  TROPONINI 0.27*   BNP (last 3 results) No results for input(s): PROBNP in the last 8760 hours. HbA1C: No results for input(s): HGBA1C in the last 72 hours. CBG: Recent Labs  Lab 11/13/17 1542 11/13/17 2021 11/14/17 0013 11/14/17 0348 11/14/17 0817  GLUCAP 92 103* 76 73 80   Lipid Profile: No results for input(s): CHOL, HDL, LDLCALC, TRIG, CHOLHDL, LDLDIRECT in the last 72 hours. Thyroid Function Tests: No results for input(s): TSH, T4TOTAL, FREET4, T3FREE, THYROIDAB in the last 72 hours. Anemia Panel: No results for input(s): VITAMINB12, FOLATE, FERRITIN, TIBC, IRON, RETICCTPCT in the last 72 hours. Urine analysis:    Component Value Date/Time   COLORURINE YELLOW 11/18/2015 0902   APPEARANCEUR CLOUDY (A) 11/18/2015  0902   LABSPEC 1.011 11/18/2015 0902   LABSPEC 1.015 03/09/2009 1116   PHURINE 8.5 (H) 11/18/2015 0902   GLUCOSEU NEGATIVE 11/18/2015 0902   HGBUR SMALL (A) 11/18/2015 0902   BILIRUBINUR NEGATIVE 11/18/2015 0902   BILIRUBINUR Negative 03/09/2009 1116   KETONESUR NEGATIVE 11/18/2015 0902   PROTEINUR 100 (A) 11/18/2015 0902   UROBILINOGEN 0.2 03/03/2015 1950   NITRITE NEGATIVE 11/18/2015 0902   LEUKOCYTESUR SMALL (A) 11/18/2015 0902   LEUKOCYTESUR Trace 03/09/2009 1116     Sharlotte Baka M.D. [facility] 11/15/2017, 10:34 AM  Pager: [phone] Between 7am to 7pm - call Pager - [phone]  After 7pm go to [email or web address] - password [email or web address]  Call night coverage person covering after 7pm

## 2017-11-15 NOTE — Social Work (Signed)
CSW acknowledging consult for transition of pt to residential hospice.   CSW will wait for palliative note tomorrow and support transition to residential hospice if needed.   [place], [place] Work [phone]

## 2017-11-15 NOTE — Progress Notes (Signed)
Palliative Medicine RN Note: Afternoon check by PMT. Patient discussed with Dr. Hilma Favors.  Family is at bedside. Patient has no s/s distress. However, respirations are again so shallow that I thought he had died. I have not attempted to elicit pain response, but he has not responded to any other stimuli I've presented. Mouth is hanging open. Vital signs are stable, but he is at very high risk for death during transport due to tachypnea and poor ventilatory effort. After discussions with Dr Hilma Favors, PMT does not support transport to hospice unless the family accepts the risk of Cameron Mendez dying during transport.   Discussed again GOC and plan with family. The topic of hospice had not been addressed with them, so I introduced this and explored their thoughts. While they are familiar with hospice, they are not ready to consider it for Cameron Mendez and are very happy with the care received here.   Spoke with Amy with [facility]. There are no beds at this time. I will reassess Cameron Mendez tomorrow, and if his breathing has stabilized, Dr Hilma Favors may support referral for inpatient hospice at that time.   Marjie Skiff Fiorela Pelzer, RN, BSN, [facility] 11/15/2017 4:09 PM Cell [phone] 8:00-4:00 Monday-Friday Office [phone]

## 2017-11-15 NOTE — Progress Notes (Signed)
Palliative Medicine RN Note: AM symptom check. No family present to discuss status or plans. PAINAD 0. Respirations are very shallow with a rate of 28 per minute; requested RN bring a dose of prn fentanyl. Plan follow up this afternoon for symptom check.  Cameron Mendez Cameron [place], RN, BSN, [facility] 11/15/2017 9:32 AM Cell [phone] 8:00-4:00 Monday-Friday Office [phone]

## 2017-11-15 NOTE — Progress Notes (Signed)
Nutrition Brief Note  Chart reviewed. Pt now transitioning to comfort care.  No further nutrition interventions warranted at this time.  Please re-consult as needed.   Dalvin Clipper A. Lautaro Koral, RD, LDN, CDE Pager: [phone] After hours Pager: [phone]  

## 2017-11-16 NOTE — Progress Notes (Signed)
Palliative Medicine RN Note: Afternoon symptom check. Wife, daughter, sister back at bedside. He is breathing a little hard and fast, and Mrs Azizi requesting a dose of fentanyl. Discussed care plan and available medications. Due to weather concerns, PMT may not have full coverage this weekend. Family and staff have our contact information in case of unmet needs. Reviewed medications with RN Ria Comment.  Marjie Skiff Tashayla Therien, RN, BSN, [facility] 11/16/2017 3:56 PM Cell [phone] 8:00-4:00 Monday-Friday Office [phone]

## 2017-11-16 NOTE — Progress Notes (Signed)
[facility]                                                                              Patient Demographics  Cameron Mendez, is a 71 y.o. male, DOB - [birth year], HDQ:[id number]  Admit date - 10/19/2017   Admitting Physician Marshell Garfinkel, MD  Outpatient Primary MD for the patient is [name], MD  Outpatient specialists:   LOS - 11  days   Medical records reviewed and are as summarized below:    Chief Complaint  Patient presents with  . Weakness       Brief summary   71 year old male with past medical history as below, which is significant for end-stage renal disease on dialysis, COPD, CHF, coronary artery disease, history of cardiac arrest, dementia, blindness, and history of stroke.  He was recently admitted to [facility] with acute encephalopathy which was poorly defined but did seem to improve and he was discharged home with a palliative care referral.  He presented again to [facility] on 11/27 with complaints of altered mental status and is accompanied by his wife.  There was some concern for infection including meningitis so he is covered with Rocephin and also azithromycin with concern for pneumonia.  Lumbar puncture was performed in the emergency department as well.  Due to this acute illness he did miss dialysis and was seen by nephrology as an inpatient.  They felt as though he is volume overloaded and he was transferred to the dialysis unit for HD.  During his dialysis on 11/27, after about 2.5 L was removed he suffered a cardiac arrest and was PEA.  He was resuscitated for a total of 12 minutes.    Patient was admitted to ICU, intubated.  Family discussion was held by [facility] CCM and palliative medicine, patient was extubated after comfort care goals were established. [facility] hospitalist service assumed care on 12/6    Assessment & Plan    Principal Problem: Cardiac arrest with PEA 12 minutes -Possibly due to sepsis or metabolic  derangements, acute on chronic diastolic CHF. -During his dialysis on 11/27, after about 2.5 L were removed, patient suffered a cardiac arrest and PEA. 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 - cont comfort care   Active Problems: Acute hypoxic respiratory failure following cardiac arrest, also acute on chronic diastolic CHF -2D echo showed EF of 55-60% with normal systolic function.  Dilated LA -Patient was intubated on 11/27, after comfort care goals were established, extubated on 12/5 - cont comfort care   Acute metabolic/anoxic encephalopathy in the setting of dementia, history of stroke -Concern for anoxic injury -EEG showed diffuse slowing, no seizures -Currently comfort care goals    ESRD on hemodialysis ([place]) -Due to comfort care goals, hemodialysis has been discontinued per family's wishes  Possible H CAP -Antibiotics have been discontinued due to comfort care goals.  There was a concern for meningitis on presentation however CSF was nondiagnostic.  History of GI bleed, transaminitis -Likely due to shock liver from cardiac arrest -No further labs  Anemia/thrombocytopenia -Comfort care goals, no further labs   Code Status: Comfort care DVT Prophylaxis:   Family Communication: No family member at the bedside  Disposition Plan: per palliative, not stable for residential hospice  Time Spent in minutes 15 minutes  Procedures:  Intubation, extubation Hemodialysis  Consultants:   PCCM Palliative   Antimicrobials:      Medications  Scheduled Meds: . chlorhexidine gluconate (MEDLINE KIT)  15 mL Mouth Rinse BID  . glycopyrrolate  0.2 mg Intravenous Q4H  . mouth rinse  15 mL Mouth Rinse QID  . sodium chloride flush  3 mL Intravenous Q12H   Continuous  Infusions: . sodium chloride 10 mL (11/13/17 2029)   PRN Meds:.sodium chloride, antiseptic oral rinse, fentaNYL (SUBLIMAZE) injection, LORazepam, polyvinyl alcohol, sodium chloride flush   Antibiotics   Anti-infectives (From admission, onward)   Start     Dose/Rate Route Frequency Ordered Stop   11/10/17 2000  ceFEPIme (MAXIPIME) 1 g in dextrose 5 % 50 mL IVPB     1 g 100 mL/hr over 30 Minutes Intravenous  Once 11/10/17 1026 11/10/17 2044   11/09/17 2000  ceFEPIme (MAXIPIME) 500 mg in dextrose 5 % 50 mL IVPB     500 mg 100 mL/hr over 30 Minutes Intravenous  Once 11/09/17 1710 11/09/17 2127   11/07/17 1200  vancomycin (VANCOCIN) 500 mg in sodium chloride 0.9 % 100 mL IVPB  Status:  Discontinued     500 mg 100 mL/hr over 60 Minutes Intravenous Every M-W-F (Hemodialysis) 11/06/17 0253 11/10/17 1018   11/06/17 0300  vancomycin (VANCOCIN) IVPB 1000 mg/200 mL premix     1,000 mg 200 mL/hr over 60 Minutes Intravenous  Once 11/06/17 0253 11/06/17 0632   11/06/17 0300  ceFEPIme (MAXIPIME) 2 g in dextrose 5 % 50 mL IVPB  Status:  Discontinued     2 g 100 mL/hr over 30 Minutes Intravenous Every M-W-F (1800) 11/06/17 0253 11/14/17 0913   10/18/2017 2330  cefTRIAXone (ROCEPHIN) 2 g in dextrose 5 % 50 mL IVPB     2 g 100 mL/hr over 30 Minutes Intravenous  Once 11/04/2017 2326 11/06/17 0328   10/21/2017 2330  azithromycin (ZITHROMAX) 500 mg in dextrose 5 % 250 mL IVPB     500 mg 250 mL/hr over 60 Minutes Intravenous  Once 10/30/2017 2326 11/06/17 0328        Subjective:   Cameron Mendez was seen and examined today.  Unresponsive, comfortable   Objective:   Vitals:   11/14/17 1328 11/15/17 0512 11/15/17 1200 11/16/17 0456  BP: (!) 150/47 (!) 142/56  (!) 148/41  Pulse: 81 71 90 (!) 102  Resp: 20 (!) 22 (!) 22 (!) 22  Temp: 97.6 F (36.4 C) 98.4 F (36.9 C) 99 F (37.2 C) 98.2 F (36.8 C)  TempSrc: Oral Oral Axillary Axillary  SpO2: 97% 94% 98% 93%  Weight:      Height:         Intake/Output Summary (Last 24 hours) at 11/16/2017 1441 Last data filed at 11/16/2017 5409 Gross per 24 hour  Intake 0 ml  Output -  Net 0 ml     Wt Readings from Last 3 Encounters:  11/14/17 56.4 kg (124 lb 5.4 oz)  10/16/17 60.6 kg (133 lb 11.2 oz)  01/04/17 61.2 kg (135 lb)     Exam   General: unresponsive  Eyes:   HEENT:  Cardiovascular: S1 S2 auscultated, no rubs, murmurs or gallops. Regular rate and rhythm. No pedal edema b/l  Respiratory: scattered rhonchi   Gastrointestinal: Soft, NT, ND  Ext: no pedal edema bilaterally  Neuro: unresponsive  Musculoskeletal: No digital cyanosis, clubbing  Skin: No rashes  Psych: unresponsive    Data Reviewed:  I have personally reviewed following labs and imaging studies  Micro Results Recent Results (from the past 240 hour(s))  MRSA PCR Screening     Status: None   Collection Time: 11/06/17  6:38 PM  Result Value Ref Range Status   MRSA by PCR NEGATIVE NEGATIVE Final    Comment:        The GeneXpert MRSA Assay (FDA approved for NASAL specimens only), is one component of a comprehensive MRSA colonization surveillance program. It is not intended to diagnose MRSA infection nor to guide or monitor treatment for MRSA infections.     Radiology Reports Dg Chest 2 View  Result Date: 10/24/2017 CLINICAL DATA:  Cough, altered mental status EXAM: CHEST  2 VIEW COMPARISON:  10/26/2017 FINDINGS: Cardiomegaly with vascular congestion and bilateral interstitial and alveolar opacities most compatible with edema. Small effusions. No acute bony abnormality. VP shunt catheter noted in the right chest wall. IMPRESSION: Mild edema/ CHF.  Small effusions. Electronically Signed   By: Rolm Baptise M.D.   On: 10/11/2017 16:57   Dg Chest 2 View  Result Date: 10/26/2017 CLINICAL DATA:  Weakness EXAM: CHEST  2 VIEW COMPARISON:  05/18/2010 FINDINGS: Cardiac shadow is enlarged but stable. Shunt catheter is again noted on the  right. Aortic calcifications are again seen and stable. The lungs are well aerated bilaterally without focal confluent infiltrate. Small bilateral pleural effusions are noted. These have increased slightly in the interval from the prior exam. Mild central vascular congestion is noted likely related to a component of fluid overload. IMPRESSION: Mild vascular congestion small effusions. A portion of this is likely related to some volume overload given the patient's clinical history. Electronically Signed   By: Inez Catalina M.D.   On: 10/26/2017 17:55   Ct Head Wo Contrast  Result Date: 10/13/2017 CLINICAL DATA:  Altered mental status at dialysis EXAM: CT HEAD WITHOUT CONTRAST TECHNIQUE: Contiguous axial images were obtained from the base of the skull through the vertex without intravenous contrast. COMPARISON:  CT 10/15/2017 FINDINGS: Brain: There is a right parietal approach shunt catheter with tip in the right lateral ventricle. The size and configuration of the ventricles are unchanged. There is no acute hemorrhage or evidence of acute cortical infarct. Prominent extra-axial CSF spaces are unchanged. There is severe periventricular hypoattenuation consistent with chronic small vessel ischemia. No midline shift or other mass effect. Old bilateral basal ganglia lacunar infarcts. Vascular: No hyperdense vessel or unexpected calcification. Skull: Normal visualized skull base, calvarium and extracranial soft tissues. Sinuses/Orbits: Bilateral maxillary retention cysts. Normal orbits. IMPRESSION: 1. No acute intracranial abnormality. 2. Unchanged size and configuration of the shunted ventricles. 3. Severe atrophy and white matter disease consistent with chronic ischemic microangiopathy. Electronically Signed   By: Ulyses Jarred M.D.   On: 10/16/2017 21:55   Ct Chest Wo Contrast  Result Date: 11/06/2017 CLINICAL DATA:  Fever of unknown origin.  Pleural effusion. EXAM: CT CHEST WITHOUT CONTRAST TECHNIQUE:  Multidetector CT imaging of the chest was performed following the standard protocol without IV contrast. COMPARISON:  Radiographs yesterday. FINDINGS: Cardiovascular: Dense aortic atherosclerosis, normal caliber aorta. Multi chamber cardiomegaly with coronary artery calcifications. Minimal pericardial fluid. Mediastinum/Nodes: Shotty mediastinal  nodes, increased in number abuts subcentimeter. The esophagus is decompressed. Visualized thyroid gland is normal. Lungs/Pleura: Mild emphysema. Mild septal thickening consistent pulmonary edema. Patchy ground-glass opacities throughout the left lung and perihilar right upper lobe. More consolidative opacities in the lingula and left upper lobe abutting the fissure and dependent right lower lobe. There is bronchial thickening. No definite endotracheal or endobronchial debris. Small bilateral pleural effusions, greater on the left. Calcified granuloma in the left upper lobe. Upper Abdomen: Upper normal spleen size. Vascular calcifications of abdominal vasculature. Atrophic kidneys. Musculoskeletal: Shunt catheter tubing in the right anterior chest wall. Degenerative change in the spine. There are no acute or suspicious osseous abnormalities. IMPRESSION: 1. Consolidations in the dependent right lower lobe and perifissural left upper lobe and lingula, may be atelectasis, pneumonia, or possibly aspiration. 2. Cardiomegaly with mild septal thickening/ pulmonary edema. Ground-glass opacities in the left lung and right upper lobe, likely alveolar pulmonary edema, less likely infectious pneumonitis. Small bilateral pleural effusions. 3. Aortic atherosclerosis coronary artery calcifications. Mild emphysema. Aortic Atherosclerosis (ICD10-I70.0) and Emphysema (ICD10-J43.9). Electronically Signed   By: Jeb Levering M.D.   On: 11/06/2017 02:55   Mr Brain Wo Contrast  Result Date: 11/06/2017 CLINICAL DATA:  71 y/o  M; patient coded during dialysis. EXAM: MRI HEAD WITHOUT CONTRAST  TECHNIQUE: Multiplanar, multiecho pulse sequences of the brain and surrounding structures were obtained without intravenous contrast. COMPARISON:  11/03/2017 CT of the head. FINDINGS: Brain: No acute infarction, hemorrhage, hydrocephalus, extra-axial collection or mass lesion. Diffuse nonspecific T2 FLAIR hyperintense signal abnormality of white matter compatible with severe chronic microvascular ischemic changes. Severe brain parenchymal volume loss and diffuse prominent perivascular spaces. Small hemosiderin stained chronic lacunar infarctions within the caudate heads bilaterally. Stable foci of chronic microhemorrhage within the left putamen and right splenium of corpus callosum. Right parietal approach ventriculostomy catheter with tip in the right lateral ventricle. No hydrocephalus. Vascular: Loss of normal flow void in the right transverse sinus. Skull and upper cervical spine: Normal marrow signal. Sinuses/Orbits: Fluid and debris within the oropharynx. Multiple mucous retention cysts within the maxillary sinuses bilaterally and mild diffuse paranasal sinus mucosal thickening. No abnormal signal of mastoid air cells. Bilateral intra-ocular lens replacement. Other: None. IMPRESSION: 1. No acute/early subacute infarct, hemorrhage, or focal mass effect. 2. Severe chronic microvascular ischemic changes and parenchymal volume loss of the brain. Small chronic lacunar infarcts in the basal ganglia bilaterally. 3. Right parietal approach ventriculostomy catheter. No hydrocephalus. 4. Mild paranasal sinus disease. 5. Loss of normal flow void in right transverse sinus, probably slow flow, less likely thrombosis. This can be further assessed with CT or MRI venogram of the head. These results will be called to the ordering clinician or representative by the Radiologist Assistant, and communication documented in the PACS or zVision Dashboard. Electronically Signed   By: Kristine Garbe M.D.   On: 11/06/2017  22:41   Dg Chest Port 1 View  Result Date: 11/12/2017 CLINICAL DATA:  Acute respiratory failure. EXAM: PORTABLE CHEST 1 VIEW COMPARISON:  11/09/2017. FINDINGS: Mild cardiac enlargement is stable. Support tubes and lines including ETT, and nasogastric tube grossly stable. Ventriculoperitoneal shunt tubing overlies the RIGHT chest, with some apparent discontinuity of the more lateral tube, likely abandoned revision. There is no consolidation or edema. No pneumothorax is evident. IMPRESSION: Mild cardiac enlargement. No consolidation or edema. Support tubes and lines appear stable. Electronically Signed   By: Staci Righter M.D.   On: 11/12/2017 07:13   Dg Chest [facility]  Result Date: 11/09/2017 CLINICAL DATA:  Acute respiratory failure. EXAM: PORTABLE CHEST 1 VIEW COMPARISON:  Radiograph of November 08, 2017. FINDINGS: Stable cardiomediastinal silhouette. Endotracheal and nasogastric tubes are unchanged in position. No pneumothorax or pleural effusion is noted. Right sided ventriculoperitoneal shunt is again noted. No pneumothorax or pleural effusion is noted. Bony thorax is unremarkable. IMPRESSION: Stable support apparatus. No acute cardiopulmonary abnormality seen. Electronically Signed   By: Marijo Conception, M.D.   On: 11/09/2017 09:19   Dg Chest Port 1 View  Result Date: 11/08/2017 CLINICAL DATA:  Acute respiratory failure EXAM: PORTABLE CHEST 1 VIEW COMPARISON:  11/07/2017 FINDINGS: Endotracheal tube in good position. Gastric tube enters the stomach with the tip not visualized. VP shunt tubing overlying the right chest unchanged Right lower lobe airspace disease with mild progression. Possible pneumonia or atelectasis. Negative for heart failure or effusion IMPRESSION: Endotracheal tube remains in good position Mild progression of right lower lobe atelectasis/ pneumonia. Electronically Signed   By: Franchot Gallo M.D.   On: 11/08/2017 06:57   Dg Chest Port 1 View  Result Date:  11/07/2017 CLINICAL DATA:  71 year old male with fever of unknown origin. Cough and altered mental status. EXAM: PORTABLE CHEST 1 VIEW COMPARISON:  11/06/2017 CT, radiographs, and earlier. FINDINGS: Portable AP semi upright view at 0427 hours. Endotracheal tube tip in good position between the clavicles and carina. Enteric tube courses to the abdomen, tip not included. Right neck, chest and abdomen shunt catheter re- demonstrated. Stable cardiomegaly and mediastinal contours. No pneumothorax. Regressed pulmonary vascularity since 10/27/2017. Patchy right lung base consolidation and pleural effusions are not evident today. Mildly improved perihilar ventilation since 11/06/2017. No areas of worsening ventilation. Paucity of upper abdominal bowel gas. IMPRESSION: 1.  Stable lines and tubes. 2. Regressed pulmonary edema since 11/09/2017. Right lower lobe consolidation and left greater than right pleural effusions seen by CT yesterday are not evident. No areas of worsening ventilation. Electronically Signed   By: Genevie Ann M.D.   On: 11/07/2017 06:51   Dg Chest Port 1 View  Result Date: 11/06/2017 CLINICAL DATA:  Intubation. EXAM: PORTABLE CHEST 1 VIEW COMPARISON:  11/06/2017 chest CT and 11/04/2017 chest radiograph FINDINGS: An endotracheal tube with tip 2.5 cm above the carina and NG tube with tip overlying the mid stomach noted. Cardiomegaly with pulmonary vascular congestion again identified. Bibasilar atelectasis, right greater than left noted. The left pleural effusion identified on today's CT is difficult to visualize radiographically. There is no evidence of pneumothorax. Ventriculoperitoneal shunt catheters are again noted. IMPRESSION: Endotracheal tube with tip 2.5 cm above the carina and NG tube with tip overlying the mid stomach. Cardiomegaly, pulmonary vascular congestion and bibasilar atelectasis, right greater than left. Electronically Signed   By: Margarette Canada M.D.   On: 11/06/2017 19:38   Dg Abd  Portable 1v  Result Date: 11/12/2017 CLINICAL DATA:  Nausea, vomiting. EXAM: PORTABLE ABDOMEN - 1 VIEW COMPARISON:  Radiograph of November 06, 2017. FINDINGS: Distal tip of nasogastric tube is seen in expected position of distal stomach or proximal duodenum. Right-sided ventricular peritoneal shunt is in good position. No abnormal bowel dilatation is noted. IMPRESSION: Distal tip of nasogastric tube seen in expected position of distal stomach or proximal duodenum. No evidence of bowel obstruction or ileus. Electronically Signed   By: Marijo Conception, M.D.   On: 11/12/2017 11:38   Dg Abd Portable 1v  Result Date: 11/06/2017 CLINICAL DATA:  NG tube placement. EXAM: PORTABLE ABDOMEN - 1 VIEW COMPARISON:  None. FINDINGS: An NG tube is identified with tip overlying the mid stomach. VP shunt catheter again noted. Bowel gas pattern is unremarkable. IMPRESSION: NG tube with tip overlying the mid stomach. Electronically Signed   By: Margarette Canada M.D.   On: 11/06/2017 19:38   Dg Fluoro Guide Lumbar Puncture  Result Date: 11/06/2017 CLINICAL DATA:  Altered mental status.  Concern for meningitis. EXAM: DIAGNOSTIC LUMBAR PUNCTURE UNDER FLUOROSCOPIC GUIDANCE FLUOROSCOPY TIME:  Radiation Exposure Index (as provided by the fluoroscopic device): 11.4 mGy If the device does not provide the exposure index: Fluoroscopy Time (in minutes and seconds):  1 minutes 36 seconds Number of Acquired Images:  1 PROCEDURE: Informed consent was obtained from the patient prior to the procedure, including potential complications of headache, allergy, and pain. With the patient prone, the lower back was prepped with Betadine. 1% Lidocaine was used for local anesthesia. Lumbar puncture was performed at the L3-L4 level using a 20 gauge needle with return of straw-colored CSF with an estimated opening pressure of 15 cm water. 8 ml of CSF were obtained for laboratory studies. The patient tolerated the procedure well and there were no apparent  complications. IMPRESSION: Successful lumbar puncture with straw-colored CSF. Electronically Signed   By: Suzy Bouchard M.D.   On: 11/06/2017 11:29    Lab Data:  CBC: Recent Labs  Lab 11/10/17 0951 11/11/17 0518 11/12/17 0004 11/13/17 0310 11/14/17 0254  WBC 6.6 6.2 6.0 6.4 6.1  HGB 8.9* 8.2* 8.2* 9.1* 8.6*  HCT 30.3* 27.6* 27.2* 29.7* 28.9*  MCV 96.5 94.5 94.8 94.9 95.7  PLT 67* 77* 74* 86* 90*   Basic Metabolic Panel: Recent Labs  Lab 11/10/17 0951 11/11/17 0518 11/12/17 0004 11/13/17 0310 11/14/17 0254  NA 137 137 139 138 139  K 4.1 3.6 4.0 4.4 4.8  CL 100* 99* 101 94* 96*  CO2 25 21* 23 25 21*  GLUCOSE 124* 104* 112* 87 81  BUN 54* 92* 119* 78* 113*  CREATININE 7.30* 8.46* 9.96* 7.39* 9.48*  CALCIUM 8.7* 8.7* 8.5* 9.2 9.1  MG 2.3 2.3 2.3  --   --   PHOS 4.8* 6.1* 6.2*  6.3*  --   --    GFR: Estimated Creatinine Clearance: 5.7 mL/min (A) (by C-G formula based on SCr of 9.48 mg/dL (H)). Liver Function Tests: Recent Labs  Lab 11/12/17 0004  ALBUMIN 2.1*   No results for input(s): LIPASE, AMYLASE in the last 168 hours. No results for input(s): AMMONIA in the last 168 hours. Coagulation Profile: No results for input(s): INR, PROTIME in the last 168 hours. Cardiac Enzymes: No results for input(s): CKTOTAL, CKMB, CKMBINDEX, TROPONINI in the last 168 hours. BNP (last 3 results) No results for input(s): PROBNP in the last 8760 hours. HbA1C: No results for input(s): HGBA1C in the last 72 hours. CBG: Recent Labs  Lab 11/13/17 1542 11/13/17 2021 11/14/17 0013 11/14/17 0348 11/14/17 0817  GLUCAP 92 103* 76 73 80   Lipid Profile: No results for input(s): CHOL, HDL, LDLCALC, TRIG, CHOLHDL, LDLDIRECT in the last 72 hours. Thyroid Function Tests: No results for input(s): TSH, T4TOTAL, FREET4, T3FREE, THYROIDAB in the last 72 hours. Anemia Panel: No results for input(s): VITAMINB12, FOLATE, FERRITIN, TIBC, IRON, RETICCTPCT in the last 72 hours. Urine  analysis:    Component Value Date/Time   COLORURINE YELLOW 11/18/2015 0902   APPEARANCEUR CLOUDY (A) 11/18/2015 0902   LABSPEC 1.011 11/18/2015 0902   LABSPEC 1.015 03/09/2009 1116   PHURINE 8.5 (H) 11/18/2015 0902  GLUCOSEU NEGATIVE 11/18/2015 0902   HGBUR SMALL (A) 11/18/2015 0902   BILIRUBINUR NEGATIVE 11/18/2015 0902   BILIRUBINUR Negative 03/09/2009 1116   KETONESUR NEGATIVE 11/18/2015 0902   PROTEINUR 100 (A) 11/18/2015 0902   UROBILINOGEN 0.2 03/03/2015 1950   NITRITE NEGATIVE 11/18/2015 0902   LEUKOCYTESUR SMALL (A) 11/18/2015 0902   LEUKOCYTESUR Trace 03/09/2009 1116     Daneen Volcy M.D. [facility] 11/16/2017, 2:41 PM  Pager: [phone] Between 7am to 7pm - call Pager - [phone]  After 7pm go to [email or web address] - password [email or web address]  Call night coverage person covering after 7pm

## 2017-11-16 NOTE — Social Work (Signed)
CSW acknowledging Palliative Care Note. CSW will continue to follow should any needs arise.  [place], [place] Work [phone]

## 2017-11-16 NOTE — Progress Notes (Signed)
Palliative Medicine RN Note: Pt seen with Dr Hilma Favors. She does not feel he is stable to transfer to hospice, and a full note from her will follow.  Discussed with Michiel Cowboy (SW) and Nira Conn [facility]).  Marjie Skiff Bernardo Brayman, RN, BSN, [facility] 11/16/2017 12:02 PM Cell [phone] 8:00-4:00 Monday-Friday Office [phone]

## 2017-11-17 DIAGNOSIS — Z4659 Encounter for fitting and adjustment of other gastrointestinal appliance and device: Secondary | ICD-10-CM

## 2017-11-17 DIAGNOSIS — Z515 Encounter for palliative care: Secondary | ICD-10-CM

## 2017-11-17 MED ORDER — FENTANYL CITRATE (PF) 100 MCG/2ML IJ SOLN
50.0000 ug | Freq: Four times a day (QID) | INTRAMUSCULAR | Status: DC
  Administered 2017-11-17: 50 ug via INTRAVENOUS
  Filled 2017-11-17 (×2): qty 2

## 2017-11-17 MED ORDER — GLYCOPYRROLATE 0.2 MG/ML IJ SOLN
0.2000 mg | Freq: Four times a day (QID) | INTRAMUSCULAR | Status: DC
  Administered 2017-11-17 (×2): 0.2 mg via INTRAVENOUS
  Filled 2017-11-17 (×2): qty 1

## 2017-12-11 NOTE — Progress Notes (Signed)
1803 Pt has no respiration, no heartbeat. Pt's family at the bedside. Time of death 43 verified with Samantha Crimes RN.

## 2017-12-11 NOTE — Progress Notes (Signed)
[facility]                                                                              Patient Demographics  Cameron Mendez, is a 72 y.o. male, DOB - Jun 12, 1946, KPT:[id number]  Admit date - 10/29/2017   Admitting Physician Marshell Garfinkel, MD  Outpatient Primary MD for the patient is [name], MD  Outpatient specialists:   LOS - 12  days   Medical records reviewed and are as summarized below:    Chief Complaint  Patient presents with  . Weakness       Brief summary   72 year old male with past medical history as below, which is significant for end-stage renal disease on dialysis, COPD, CHF, coronary artery disease, history of cardiac arrest, dementia, blindness, and history of stroke.  He was recently admitted to [facility] with acute encephalopathy which was poorly defined but did seem to improve and he was discharged home with a palliative care referral.  He presented again to [facility] on 11/27 with complaints of altered mental status and is accompanied by his wife.  There was some concern for infection including meningitis so he is covered with Rocephin and also azithromycin with concern for pneumonia.  Lumbar puncture was performed in the emergency department as well.  Due to this acute illness he did miss dialysis and was seen by nephrology as an inpatient.  They felt as though he is volume overloaded and he was transferred to the dialysis unit for HD.  During his dialysis on 11/27, after about 2.5 L was removed he suffered a cardiac arrest and was PEA.  He was resuscitated for a total of 12 minutes.    Patient was admitted to ICU, intubated.  Family discussion was held by [facility] CCM and palliative medicine, patient was extubated after comfort care goals were established. [facility] hospitalist service assumed care on 12/6    Assessment & Plan    Principal Problem: Cardiac arrest with PEA 12 minutes -Possibly due to sepsis or metabolic  derangements, acute on chronic diastolic CHF. -During his dialysis on 11/27, after about 2.5 L were removed, patient suffered a cardiac arrest and PEA. 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 - cont comfort care.  Discussed with palliative care, shallow breathing, placed on fentanyl  Active Problems: Acute hypoxic respiratory failure following cardiac arrest, also acute on chronic diastolic CHF -2D echo showed EF of 55-60% with normal systolic function.  Dilated LA -Patient was intubated on 11/27, after comfort care goals were established, extubated on 12/5 - cont comfort care   Acute metabolic/anoxic encephalopathy in the setting of dementia, history of stroke -Concern for anoxic injury -EEG showed diffuse slowing, no seizures -Currently comfort care goals    ESRD on hemodialysis ([facility]) -Due to comfort care goals, hemodialysis has been discontinued per family's wishes  Possible H CAP -Antibiotics have been discontinued due to comfort care goals.  There was a concern  for meningitis on presentation however CSF was nondiagnostic.  History of GI bleed, transaminitis -Likely due to shock liver from cardiac arrest -No further labs  Anemia/thrombocytopenia -Comfort care goals, no further labs   Code Status: Comfort care DVT Prophylaxis:   Family Communication: No family member at the bedside  Disposition Plan: per palliative, not stable for residential hospice  Time Spent in minutes 15 minutes  Procedures:  Intubation, extubation Hemodialysis  Consultants:   PCCM Palliative   Antimicrobials:      Medications  Scheduled Meds: . chlorhexidine gluconate (MEDLINE KIT)  15 mL Mouth Rinse BID  . fentaNYL (SUBLIMAZE) injection  50 mcg Intravenous Q6H  . glycopyrrolate   0.2 mg Intravenous Q6H  . mouth rinse  15 mL Mouth Rinse QID  . sodium chloride flush  3 mL Intravenous Q12H   Continuous Infusions: . sodium chloride 10 mL (11/13/17 2029)   PRN Meds:.sodium chloride, antiseptic oral rinse, fentaNYL (SUBLIMAZE) injection, LORazepam, polyvinyl alcohol, sodium chloride flush   Antibiotics   Anti-infectives (From admission, onward)   Start     Dose/Rate Route Frequency Ordered Stop   11/10/17 2000  ceFEPIme (MAXIPIME) 1 g in dextrose 5 % 50 mL IVPB     1 g 100 mL/hr over 30 Minutes Intravenous  Once 11/10/17 1026 11/10/17 2044   11/09/17 2000  ceFEPIme (MAXIPIME) 500 mg in dextrose 5 % 50 mL IVPB     500 mg 100 mL/hr over 30 Minutes Intravenous  Once 11/09/17 1710 11/09/17 2127   11/07/17 1200  vancomycin (VANCOCIN) 500 mg in sodium chloride 0.9 % 100 mL IVPB  Status:  Discontinued     500 mg 100 mL/hr over 60 Minutes Intravenous Every M-W-F (Hemodialysis) 11/06/17 0253 11/10/17 1018   11/06/17 0300  vancomycin (VANCOCIN) IVPB 1000 mg/200 mL premix     1,000 mg 200 mL/hr over 60 Minutes Intravenous  Once 11/06/17 0253 11/06/17 0632   11/06/17 0300  ceFEPIme (MAXIPIME) 2 g in dextrose 5 % 50 mL IVPB  Status:  Discontinued     2 g 100 mL/hr over 30 Minutes Intravenous Every M-W-F (1800) 11/06/17 0253 11/14/17 0913   10/21/2017 2330  cefTRIAXone (ROCEPHIN) 2 g in dextrose 5 % 50 mL IVPB     2 g 100 mL/hr over 30 Minutes Intravenous  Once 10/17/2017 2326 11/06/17 0328   10/19/2017 2330  azithromycin (ZITHROMAX) 500 mg in dextrose 5 % 250 mL IVPB     500 mg 250 mL/hr over 60 Minutes Intravenous  Once 10/13/2017 2326 11/06/17 0328        Subjective:   Cameron Mendez was seen and examined today.  Unresponsive, shallow breathing   Objective:   Vitals:   11/15/17 0512 11/15/17 1200 11/16/17 0456 2017/12/02 0421  BP: (!) 142/56  (!) 148/41 (!) 143/38  Pulse: 71 90 (!) 102 (!) 107  Resp: (!) 22 (!) 22 (!) 22 19  Temp: 98.4 F (36.9 C) 99 F (37.2 C) 98.2  F (36.8 C) 100.3 F (37.9 C)  TempSrc: Oral Axillary Axillary Oral  SpO2: 94% 98% 93% 93%  Weight:      Height:        Intake/Output Summary (Last 24 hours) at Dec 02, 2017 1419 Last data filed at 2017-12-02 0425 Gross per 24 hour  Intake 0 ml  Output 0 ml  Net 0 ml     Wt Readings from Last 3 Encounters:  11/14/17 56.4 kg (124 lb 5.4 oz)  10/16/17 60.6 kg (133 lb 11.2  oz)  01/04/17 61.2 kg (135 lb)     Exam   General: Unresponsive, shallow breathing  Eyes:   HEENT:   Cardiovascular: S1S2 clear  Respiratory: Decreased breath sounds  Gastrointestinal: Soft,NT  Ext: no pedal edema bilaterally  Neuro: unresponsive  Musculoskeletal: No digital cyanosis, clubbing  Skin: No rashes  Psych:unresponsive   Data Reviewed:  I have personally reviewed following labs and imaging studies  Micro Results No results found for this or any previous visit (from the past 240 hour(s)).  Radiology Reports Dg Chest 2 View  Result Date: 10/22/2017 CLINICAL DATA:  Cough, altered mental status EXAM: CHEST  2 VIEW COMPARISON:  10/26/2017 FINDINGS: Cardiomegaly with vascular congestion and bilateral interstitial and alveolar opacities most compatible with edema. Small effusions. No acute bony abnormality. VP shunt catheter noted in the right chest wall. IMPRESSION: Mild edema/ CHF.  Small effusions. Electronically Signed   By: Rolm Baptise M.D.   On: 10/14/2017 16:57   Dg Chest 2 View  Result Date: 10/26/2017 CLINICAL DATA:  Weakness EXAM: CHEST  2 VIEW COMPARISON:  05/18/2010 FINDINGS: Cardiac shadow is enlarged but stable. Shunt catheter is again noted on the right. Aortic calcifications are again seen and stable. The lungs are well aerated bilaterally without focal confluent infiltrate. Small bilateral pleural effusions are noted. These have increased slightly in the interval from the prior exam. Mild central vascular congestion is noted likely related to a component of fluid  overload. IMPRESSION: Mild vascular congestion small effusions. A portion of this is likely related to some volume overload given the patient's clinical history. Electronically Signed   By: Inez Catalina M.D.   On: 10/26/2017 17:55   Ct Head Wo Contrast  Result Date: 11/06/2017 CLINICAL DATA:  Altered mental status at dialysis EXAM: CT HEAD WITHOUT CONTRAST TECHNIQUE: Contiguous axial images were obtained from the base of the skull through the vertex without intravenous contrast. COMPARISON:  CT 10/15/2017 FINDINGS: Brain: There is a right parietal approach shunt catheter with tip in the right lateral ventricle. The size and configuration of the ventricles are unchanged. There is no acute hemorrhage or evidence of acute cortical infarct. Prominent extra-axial CSF spaces are unchanged. There is severe periventricular hypoattenuation consistent with chronic small vessel ischemia. No midline shift or other mass effect. Old bilateral basal ganglia lacunar infarcts. Vascular: No hyperdense vessel or unexpected calcification. Skull: Normal visualized skull base, calvarium and extracranial soft tissues. Sinuses/Orbits: Bilateral maxillary retention cysts. Normal orbits. IMPRESSION: 1. No acute intracranial abnormality. 2. Unchanged size and configuration of the shunted ventricles. 3. Severe atrophy and white matter disease consistent with chronic ischemic microangiopathy. Electronically Signed   By: Ulyses Jarred M.D.   On: 10/14/2017 21:55   Ct Chest Wo Contrast  Result Date: 11/06/2017 CLINICAL DATA:  Fever of unknown origin.  Pleural effusion. EXAM: CT CHEST WITHOUT CONTRAST TECHNIQUE: Multidetector CT imaging of the chest was performed following the standard protocol without IV contrast. COMPARISON:  Radiographs yesterday. FINDINGS: Cardiovascular: Dense aortic atherosclerosis, normal caliber aorta. Multi chamber cardiomegaly with coronary artery calcifications. Minimal pericardial fluid. Mediastinum/Nodes:  Shotty mediastinal nodes, increased in number abuts subcentimeter. The esophagus is decompressed. Visualized thyroid gland is normal. Lungs/Pleura: Mild emphysema. Mild septal thickening consistent pulmonary edema. Patchy ground-glass opacities throughout the left lung and perihilar right upper lobe. More consolidative opacities in the lingula and left upper lobe abutting the fissure and dependent right lower lobe. There is bronchial thickening. No definite endotracheal or endobronchial debris. Small bilateral pleural effusions, greater  on the left. Calcified granuloma in the left upper lobe. Upper Abdomen: Upper normal spleen size. Vascular calcifications of abdominal vasculature. Atrophic kidneys. Musculoskeletal: Shunt catheter tubing in the right anterior chest wall. Degenerative change in the spine. There are no acute or suspicious osseous abnormalities. IMPRESSION: 1. Consolidations in the dependent right lower lobe and perifissural left upper lobe and lingula, may be atelectasis, pneumonia, or possibly aspiration. 2. Cardiomegaly with mild septal thickening/ pulmonary edema. Ground-glass opacities in the left lung and right upper lobe, likely alveolar pulmonary edema, less likely infectious pneumonitis. Small bilateral pleural effusions. 3. Aortic atherosclerosis coronary artery calcifications. Mild emphysema. Aortic Atherosclerosis (ICD10-I70.0) and Emphysema (ICD10-J43.9). Electronically Signed   By: Jeb Levering M.D.   On: 11/06/2017 02:55   Mr Brain Wo Contrast  Result Date: 11/06/2017 CLINICAL DATA:  72 y/o  M; patient coded during dialysis. EXAM: MRI HEAD WITHOUT CONTRAST TECHNIQUE: Multiplanar, multiecho pulse sequences of the brain and surrounding structures were obtained without intravenous contrast. COMPARISON:  10/30/2017 CT of the head. FINDINGS: Brain: No acute infarction, hemorrhage, hydrocephalus, extra-axial collection or mass lesion. Diffuse nonspecific T2 FLAIR hyperintense signal  abnormality of white matter compatible with severe chronic microvascular ischemic changes. Severe brain parenchymal volume loss and diffuse prominent perivascular spaces. Small hemosiderin stained chronic lacunar infarctions within the caudate heads bilaterally. Stable foci of chronic microhemorrhage within the left putamen and right splenium of corpus callosum. Right parietal approach ventriculostomy catheter with tip in the right lateral ventricle. No hydrocephalus. Vascular: Loss of normal flow void in the right transverse sinus. Skull and upper cervical spine: Normal marrow signal. Sinuses/Orbits: Fluid and debris within the oropharynx. Multiple mucous retention cysts within the maxillary sinuses bilaterally and mild diffuse paranasal sinus mucosal thickening. No abnormal signal of mastoid air cells. Bilateral intra-ocular lens replacement. Other: None. IMPRESSION: 1. No acute/early subacute infarct, hemorrhage, or focal mass effect. 2. Severe chronic microvascular ischemic changes and parenchymal volume loss of the brain. Small chronic lacunar infarcts in the basal ganglia bilaterally. 3. Right parietal approach ventriculostomy catheter. No hydrocephalus. 4. Mild paranasal sinus disease. 5. Loss of normal flow void in right transverse sinus, probably slow flow, less likely thrombosis. This can be further assessed with CT or MRI venogram of the head. These results will be called to the ordering clinician or representative by the Radiologist Assistant, and communication documented in the PACS or zVision Dashboard. Electronically Signed   By: Kristine Garbe M.D.   On: 11/06/2017 22:41   Dg Chest Port 1 View  Result Date: 11/12/2017 CLINICAL DATA:  Acute respiratory failure. EXAM: PORTABLE CHEST 1 VIEW COMPARISON:  11/09/2017. FINDINGS: Mild cardiac enlargement is stable. Support tubes and lines including ETT, and nasogastric tube grossly stable. Ventriculoperitoneal shunt tubing overlies the RIGHT  chest, with some apparent discontinuity of the more lateral tube, likely abandoned revision. There is no consolidation or edema. No pneumothorax is evident. IMPRESSION: Mild cardiac enlargement. No consolidation or edema. Support tubes and lines appear stable. Electronically Signed   By: Staci Righter M.D.   On: 11/12/2017 07:13   Dg Chest Port 1 View  Result Date: 11/09/2017 CLINICAL DATA:  Acute respiratory failure. EXAM: PORTABLE CHEST 1 VIEW COMPARISON:  Radiograph of November 08, 2017. FINDINGS: Stable cardiomediastinal silhouette. Endotracheal and nasogastric tubes are unchanged in position. No pneumothorax or pleural effusion is noted. Right sided ventriculoperitoneal shunt is again noted. No pneumothorax or pleural effusion is noted. Bony thorax is unremarkable. IMPRESSION: Stable support apparatus. No acute cardiopulmonary abnormality seen. Electronically  Signed   By: Marijo Conception, M.D.   On: 11/09/2017 09:19   Dg Chest Port 1 View  Result Date: 11/08/2017 CLINICAL DATA:  Acute respiratory failure EXAM: PORTABLE CHEST 1 VIEW COMPARISON:  11/07/2017 FINDINGS: Endotracheal tube in good position. Gastric tube enters the stomach with the tip not visualized. VP shunt tubing overlying the right chest unchanged Right lower lobe airspace disease with mild progression. Possible pneumonia or atelectasis. Negative for heart failure or effusion IMPRESSION: Endotracheal tube remains in good position Mild progression of right lower lobe atelectasis/ pneumonia. Electronically Signed   By: Franchot Gallo M.D.   On: 11/08/2017 06:57   Dg Chest Port 1 View  Result Date: 11/07/2017 CLINICAL DATA:  72 year old male with fever of unknown origin. Cough and altered mental status. EXAM: PORTABLE CHEST 1 VIEW COMPARISON:  11/06/2017 CT, radiographs, and earlier. FINDINGS: Portable AP semi upright view at 0427 hours. Endotracheal tube tip in good position between the clavicles and carina. Enteric tube courses to  the abdomen, tip not included. Right neck, chest and abdomen shunt catheter re- demonstrated. Stable cardiomegaly and mediastinal contours. No pneumothorax. Regressed pulmonary vascularity since 10/18/2017. Patchy right lung base consolidation and pleural effusions are not evident today. Mildly improved perihilar ventilation since 11/06/2017. No areas of worsening ventilation. Paucity of upper abdominal bowel gas. IMPRESSION: 1.  Stable lines and tubes. 2. Regressed pulmonary edema since 11/04/2017. Right lower lobe consolidation and left greater than right pleural effusions seen by CT yesterday are not evident. No areas of worsening ventilation. Electronically Signed   By: Genevie Ann M.D.   On: 11/07/2017 06:51   Dg Chest Port 1 View  Result Date: 11/06/2017 CLINICAL DATA:  Intubation. EXAM: PORTABLE CHEST 1 VIEW COMPARISON:  11/06/2017 chest CT and 10/15/2017 chest radiograph FINDINGS: An endotracheal tube with tip 2.5 cm above the carina and NG tube with tip overlying the mid stomach noted. Cardiomegaly with pulmonary vascular congestion again identified. Bibasilar atelectasis, right greater than left noted. The left pleural effusion identified on today's CT is difficult to visualize radiographically. There is no evidence of pneumothorax. Ventriculoperitoneal shunt catheters are again noted. IMPRESSION: Endotracheal tube with tip 2.5 cm above the carina and NG tube with tip overlying the mid stomach. Cardiomegaly, pulmonary vascular congestion and bibasilar atelectasis, right greater than left. Electronically Signed   By: Margarette Canada M.D.   On: 11/06/2017 19:38   Dg Abd Portable 1v  Result Date: 11/12/2017 CLINICAL DATA:  Nausea, vomiting. EXAM: PORTABLE ABDOMEN - 1 VIEW COMPARISON:  Radiograph of November 06, 2017. FINDINGS: Distal tip of nasogastric tube is seen in expected position of distal stomach or proximal duodenum. Right-sided ventricular peritoneal shunt is in good position. No abnormal bowel  dilatation is noted. IMPRESSION: Distal tip of nasogastric tube seen in expected position of distal stomach or proximal duodenum. No evidence of bowel obstruction or ileus. Electronically Signed   By: Marijo Conception, M.D.   On: 11/12/2017 11:38   Dg Abd Portable 1v  Result Date: 11/06/2017 CLINICAL DATA:  NG tube placement. EXAM: PORTABLE ABDOMEN - 1 VIEW COMPARISON:  None. FINDINGS: An NG tube is identified with tip overlying the mid stomach. VP shunt catheter again noted. Bowel gas pattern is unremarkable. IMPRESSION: NG tube with tip overlying the mid stomach. Electronically Signed   By: Margarette Canada M.D.   On: 11/06/2017 19:38   Dg Fluoro Guide Lumbar Puncture  Result Date: 11/06/2017 CLINICAL DATA:  Altered mental status.  Concern for meningitis.  EXAM: DIAGNOSTIC LUMBAR PUNCTURE UNDER FLUOROSCOPIC GUIDANCE FLUOROSCOPY TIME:  Radiation Exposure Index (as provided by the fluoroscopic device): 11.4 mGy If the device does not provide the exposure index: Fluoroscopy Time (in minutes and seconds):  1 minutes 36 seconds Number of Acquired Images:  1 PROCEDURE: Informed consent was obtained from the patient prior to the procedure, including potential complications of headache, allergy, and pain. With the patient prone, the lower back was prepped with Betadine. 1% Lidocaine was used for local anesthesia. Lumbar puncture was performed at the L3-L4 level using a 20 gauge needle with return of straw-colored CSF with an estimated opening pressure of 15 cm water. 8 ml of CSF were obtained for laboratory studies. The patient tolerated the procedure well and there were no apparent complications. IMPRESSION: Successful lumbar puncture with straw-colored CSF. Electronically Signed   By: Suzy Bouchard M.D.   On: 11/06/2017 11:29    Lab Data:  CBC: Recent Labs  Lab 11/11/17 0518 11/12/17 0004 11/13/17 0310 11/14/17 0254  WBC 6.2 6.0 6.4 6.1  HGB 8.2* 8.2* 9.1* 8.6*  HCT 27.6* 27.2* 29.7* 28.9*  MCV 94.5  94.8 94.9 95.7  PLT 77* 74* 86* 90*   Basic Metabolic Panel: Recent Labs  Lab 11/11/17 0518 11/12/17 0004 11/13/17 0310 11/14/17 0254  NA 137 139 138 139  K 3.6 4.0 4.4 4.8  CL 99* 101 94* 96*  CO2 21* 23 25 21*  GLUCOSE 104* 112* 87 81  BUN 92* 119* 78* 113*  CREATININE 8.46* 9.96* 7.39* 9.48*  CALCIUM 8.7* 8.5* 9.2 9.1  MG 2.3 2.3  --   --   PHOS 6.1* 6.2*  6.3*  --   --    GFR: Estimated Creatinine Clearance: 5.7 mL/min (A) (by C-G formula based on SCr of 9.48 mg/dL (H)). Liver Function Tests: Recent Labs  Lab 11/12/17 0004  ALBUMIN 2.1*   No results for input(s): LIPASE, AMYLASE in the last 168 hours. No results for input(s): AMMONIA in the last 168 hours. Coagulation Profile: No results for input(s): INR, PROTIME in the last 168 hours. Cardiac Enzymes: No results for input(s): CKTOTAL, CKMB, CKMBINDEX, TROPONINI in the last 168 hours. BNP (last 3 results) No results for input(s): PROBNP in the last 8760 hours. HbA1C: No results for input(s): HGBA1C in the last 72 hours. CBG: Recent Labs  Lab 11/13/17 1542 11/13/17 2021 11/14/17 0013 11/14/17 0348 11/14/17 0817  GLUCAP 92 103* 76 73 80   Lipid Profile: No results for input(s): CHOL, HDL, LDLCALC, TRIG, CHOLHDL, LDLDIRECT in the last 72 hours. Thyroid Function Tests: No results for input(s): TSH, T4TOTAL, FREET4, T3FREE, THYROIDAB in the last 72 hours. Anemia Panel: No results for input(s): VITAMINB12, FOLATE, FERRITIN, TIBC, IRON, RETICCTPCT in the last 72 hours. Urine analysis:    Component Value Date/Time   COLORURINE YELLOW 11/18/2015 0902   APPEARANCEUR CLOUDY (A) 11/18/2015 0902   LABSPEC 1.011 11/18/2015 0902   LABSPEC 1.015 03/09/2009 1116   PHURINE 8.5 (H) 11/18/2015 0902   GLUCOSEU NEGATIVE 11/18/2015 0902   HGBUR SMALL (A) 11/18/2015 0902   BILIRUBINUR NEGATIVE 11/18/2015 0902   BILIRUBINUR Negative 03/09/2009 1116   KETONESUR NEGATIVE 11/18/2015 0902   PROTEINUR 100 (A) 11/18/2015  0902   UROBILINOGEN 0.2 03/03/2015 1950   NITRITE NEGATIVE 11/18/2015 0902   LEUKOCYTESUR SMALL (A) 11/18/2015 0902   LEUKOCYTESUR Trace 03/09/2009 1116     Ripudeep Rai M.D. [facility] 12-07-2017, 2:19 PM  Pager: [phone] Between 7am to 7pm - call Pager -  [phone]  After 7pm go to [email or web address] - password [email or web address]  Call night coverage person covering after 7pm

## 2017-12-11 NOTE — Progress Notes (Signed)
Daily Progress Note   Patient Name: Cameron Mendez       Date: 2017-12-08 DOB: 1946-02-15  Age: 72 y.o. MRN#: [id number] Attending Physician: Mendel Corning, MD Primary Care Physician: [name], MD Admit Date: 10/22/2017  Reason for Consultation/Follow-up: Non pain symptom management, Pain control, Psychosocial/spiritual support and Terminal Care  Subjective: Patient seen, chart reviewed.  No family at the bedside.  No nonverbal signs and symptoms of respiratory distress or pain.  Patient has received 3 boluses of fentanyl for respiratory distress in the past 24 hours  Length of Stay: 12  Current Medications: Scheduled Meds:  . chlorhexidine gluconate (MEDLINE KIT)  15 mL Mouth Rinse BID  . fentaNYL (SUBLIMAZE) injection  50 mcg Intravenous Q6H  . glycopyrrolate  0.2 mg Intravenous Q6H  . mouth rinse  15 mL Mouth Rinse QID  . sodium chloride flush  3 mL Intravenous Q12H    Continuous Infusions: . sodium chloride 10 mL (11/13/17 2029)    PRN Meds: sodium chloride, antiseptic oral rinse, fentaNYL (SUBLIMAZE) injection, LORazepam, polyvinyl alcohol, sodium chloride flush  Physical Exam  Constitutional:  Frail, cachectic older man.  He is unresponsive  HENT:  Temporal wasting  Cardiovascular: Normal rate.  Pulmonary/Chest:  Respirations shallow, uneven  Neurological:  Unresponsive  Skin: Skin is warm and dry.  Psychiatric:  Unable to test  Nursing note and vitals reviewed.           Vital Signs: BP (!) 143/38 (BP Location: Right Arm)   Pulse (!) 107   Temp 100.3 F (37.9 C) (Oral)   Resp 19   Ht _0  (1.6 m)   Wt 56.4 kg (124 lb 5.4 oz)   SpO2 93%   BMI 22.03 kg/m  SpO2: SpO2: 93 % O2 Device: O2 Device: Not Delivered O2 Flow Rate: O2 Flow Rate (L/min): 3  L/min  Intake/output summary:   Intake/Output Summary (Last 24 hours) at 12-08-17 1143 Last data filed at 12/08/17 0425 Gross per 24 hour  Intake 0 ml  Output 0 ml  Net 0 ml   LBM: Last BM Date: 11/14/17 Baseline Weight: Weight: 59 kg (130 lb) Most recent weight: Weight: 56.4 kg (124 lb 5.4 oz)       Palliative Assessment/Data:    Flowsheet Rows     Most Recent Value  Intake Tab  Referral Department  Pulmonary  Unit at Time of Referral  Med/Surg Unit  Palliative Care Primary Diagnosis  Cardiac  Date Notified  11/08/17  Palliative Care Type  New Palliative care  Reason for referral  Clarify Goals of Care  Date of Admission  11/03/2017  Date first seen by Palliative Care  11/09/17  # of days Palliative referral response time  1 Day(s)  # of days IP prior to Palliative referral  3  Clinical Assessment  Psychosocial & Spiritual Assessment  Palliative Care Outcomes      Patient Active Problem List   Diagnosis Date Noted  . Anoxic brain damage (HCC)   . Acute respiratory failure ([place])   . Goals of care, counseling/discussion   . Palliative care encounter   . Fever 10/21/2017  . Acute encephalopathy 10/15/2017  . Encephalopathy   . GI bleed 12/15/2016  . Hypokalemia 12/15/2016  . Gastrointestinal hemorrhage   . Symptomatic anemia 11/18/2015  . Acute coronary syndrome ([place]) 09/28/2015  . HCAP (healthcare-associated pneumonia) 09/18/2015  . Cardiac arrest ([place]) 09/13/2015  . Paroxysmal atrial fibrillation ([place]) 03/03/2015  . Tobacco abuse 01/10/2015  . Pleural effusion 01/09/2015  . Chronic diastolic CHF (congestive heart failure) ([place]) 01/08/2015  . History of adenomatous polyp of colon 12/31/2014  . Pancytopenia ([place]) 08/21/2014  . COPD (chronic obstructive pulmonary disease) ([place]) 05/12/2014  . COPD exacerbation ([place]) 04/29/2014  . History of pericardiocentesis 04/27/2014  . Volume overload 04/27/2014  . Anemia 09/18/2013  . Thrombocytopenia ([place])  09/18/2013  . ESRD on hemodialysis ([place]) 09/18/2013  . Essential hypertension, benign 10/04/2009  . BLINDNESS, [place], [place] DEFINITION 10/01/2009  . OSTEOARTHRITIS 10/01/2009  . Gout, unspecified 09/28/2009    Palliative Care Assessment & Plan   Patient Profile: 72 year old male with past medical history as below, which is significant for end-stage renal disease on dialysis, COPD, CHF, coronary artery disease, history of cardiac arrest, dementia, blindness, and history of stroke. He was recently admitted to [facility] with acute encephalopathy which was poorly defined but did seem to improve and he was discharged home with a palliative care referral. He presented again to [facility] on 11/27 with complaints of altered mental status and is accompanied by his wife. There was some concern for infection including meningitis so he is covered with Rocephin and also azithromycin with concern for pneumonia. Lumbar puncture was performed in the emergency department as well. Due to this acute illness he did miss dialysis and was seen by nephrology as an inpatient. They felt as though he is volume overloaded and he was transferred to the dialysis unit for HD. During his dialysis on 11/27, after about 2.5 L was removed he suffered a cardiac arrest and was PEA. He was resuscitated for a total of 12 minutes.   Patient was admitted to ICU, intubated.  Family discussion was held by [facility] CCM and palliative medicine, patient was extubated after comfort care goals were established.  Patient is now actively dying.  He is unstable for transport to residential hospice    Recommendations/Plan:  We will schedule fentanyl 50 mcg every 6 hours and continue breakthrough dosing at 50 mcg to 100 mcg every 2 hours as needed.  Monitor for need for fentanyl continuous infusion for shortness of breath and/or pain  Oral mucosa is  dry and there is no up upper airway secretions noted.  Will decrease Robinul  to 0.2 every 6 hours  Goals of Care and Additional  Recommendations:  Limitations on Scope of Treatment: Full Comfort Care  Code Status:    Code Status Orders  (From admission, onward)        Start     Ordered   11/14/17 0919  Do not attempt resuscitation (DNR)  Continuous    Question Answer Comment  In the event of cardiac or respiratory ARREST Do not call a "code blue"   In the event of cardiac or respiratory ARREST Do not perform Intubation, CPR, defibrillation or ACLS   In the event of cardiac or respiratory ARREST Use medication by any route, position, wound care, and other measures to relive pain and suffering. May use oxygen, suction and manual treatment of airway obstruction as needed for comfort.      11/14/17 0918    Code Status History    Date Active Date Inactive Code Status Order ID Comments User Context   11/14/2017 09:18 11/14/2017 09:18 DNR [id number]  Pershing Proud, NP Inpatient   11/06/2017 18:38 11/14/2017 09:18 DNR [id number]  Corey Harold, NP Inpatient   11/06/2017 02:09 11/06/2017 18:37 Full Code [id number]  Jani Gravel, MD ED   10/15/2017 23:28 10/16/2017 19:11 Full Code [id number]  Norval Morton, MD ED   12/15/2016 20:22 12/21/2016 23:26 Full Code [id number]  Vianne Bulls, MD ED   11/23/2016 09:25 11/28/2016 21:30 Full Code [id number]  Samella Parr, NP Inpatient   11/18/2015 11:55 11/22/2015 17:53 Full Code [id number]  Willia Craze, NP ED   09/28/2015 20:36 10/01/2015 22:39 DNR [id number]  Dixie Dials, MD ED   09/13/2015 09:39 09/21/2015 19:15 Full Code [id number]  Donita Brooks, NP ED   03/04/2015 00:48 03/04/2015 22:47 Full Code [id number]  Dixie Dials, MD Inpatient   01/08/2015 21:53 01/14/2015 17:32 Full Code [id number]  [name], MD Inpatient   09/10/2014 20:29 09/15/2014 19:10 Full Code [id number]  Berle Mull, MD ED   08/21/2014 20:47 08/24/2014 18:50 Full Code [id number]  [name], MD Inpatient   04/27/2014 18:07 05/01/2014 18:08  Full Code [id number]  Sol Blazing, MD Inpatient   09/17/2013 06:07 09/26/2013 19:12 Full Code [id number]  Rise Patience, MD Inpatient       Prognosis:   Hours - Days  Discharge Planning:  Anticipated Hospital Death   Thank you for allowing the Palliative Medicine Team to assist in the care of this patient.   Time In: 1115 Time Out: 1140 Total Time 25 min Prolonged Time Billed  no       Greater than 50%  of this time was spent counseling and coordinating care related to the above assessment and plan.  Dory Horn, NP  Please contact Palliative Medicine Team phone at [phone] for questions and concerns.

## 2017-12-11 NOTE — Death Summary Note (Signed)
DEATH SUMMARY   Patient Details  Name: Cameron Mendez MRN: [id number] DOB: 04-05-1946  Admission/Discharge Information   Admit Date:  2017-11-09  Date of Death: Date of Death: 2017-11-21  Time of Death: Time of Death: 1802-03-12  Length of Stay: 02-24-2023  Referring Physician: [name], MD   Reason(s) for Hospitalization  Acute metabolic encephalopathy  Diagnoses  Preliminary cause of death:   Acute hypoxic respiratory failure  Secondary Diagnoses (including complications and co-morbidities):  Cardiac arrest ([place])  Acute on chronic diastolic congestive failure    Essential hypertension, benign   ESRD on hemodialysis ([place])   Acute encephalopathy   Anoxic brain damage (H[facility])   Dementia   Transamnitis   Possible health care associated pneumonia    Brief Hospital Course (including significant findings, care, treatment, and services provided and events leading to death)  Cameron Mendez  was a 72 y.o. year old male with past medical history  significant for end-stage renal disease on dialysis, COPD, CHF, coronary artery disease, history of cardiac arrest, dementia, blindness, and history of stroke. He was recently admitted to [facility] with acute encephalopathy which was poorly defined but did seem to improve and he was discharged home with a palliative care referral. He presented again to [facility] [place] with complaints of altered mental status and accompanied by his wife. There was some concern for infection including meningitis so he is covered with Rocephin and also azithromycin with concern for pneumonia. Lumbar puncture was performed in the emergency department as well. Due to this acute illness he did miss dialysis and was seen by nephrology as an inpatient. They felt as though he is volume overloaded and he was transferred to the dialysis unit for HD. During his dialysis on 11/27, after about 2.5 L was removed he suffered a cardiac arrest and was PEA. He was  resuscitated for a total of 12 minutes.   Patient was admitted to ICU, intubated.  Family discussion was held by [facility] CCM and palliative medicine, patient was extubated after comfort care goals were established. [facility] hospitalist service assumed care on 12/6  Hospital course  Cardiac arrest with PEA 12 minutes -Possibly due to sepsis or metabolic derangements, acute on chronic diastolic CHF. -During his dialysis on 11/27, after about 2.5 L were removed, patient suffered a cardiac arrest and PEA. Suffered from anoxic encephalopathy, EEG noted to be diffuse slowing however no seizures -With no significant improvement, palliative medicine was consulted, goals of care meeting were held and patient was placed on comfort care goals per family's wishes.  Acute hypoxic respiratory failure following cardiac arrest, also acute on chronic diastolic CHF -2D echo showed EF of 55-60% with normal systolic function.  Dilated LA -Patient was intubated on 11/27, after comfort care goals were established, terminally extubated on 64/3   Acute metabolic/anoxic encephalopathy in the setting of dementia, history of stroke -Concern for anoxic brain injury after cardiac arrest -EEG showed diffuse slowing, no seizures    ESRD on hemodialysis ([name]) -Due to comfort care goals, hemodialysis was discontinued per family's wishes  Possible HCAP -Antibiotics were discontinued due to comfort care goals.  There was a concern for meningitis on presentation however CSF was nondiagnostic.  History of GI bleed, transaminitis -Likely due to shock liver from cardiac arrest -No further labs were obtained  Anemia/thrombocytopenia -Comfort care goals  Patient passed on 2017/11/21.     Pertinent Labs and Studies  Significant Diagnostic Studies Dg Chest 2 View  Result  Date: 10/24/2017 CLINICAL DATA:  Cough, altered mental status EXAM: CHEST  2 VIEW COMPARISON:  10/26/2017 FINDINGS: Cardiomegaly with vascular congestion  and bilateral interstitial and alveolar opacities most compatible with edema. Small effusions. No acute bony abnormality. VP shunt catheter noted in the right chest wall. IMPRESSION: Mild edema/ CHF.  Small effusions. Electronically Signed   By: Rolm Baptise M.D.   On: 10/26/2017 16:57   Dg Chest 2 View  Result Date: 10/26/2017 CLINICAL DATA:  Weakness EXAM: CHEST  2 VIEW COMPARISON:  05/18/2010 FINDINGS: Cardiac shadow is enlarged but stable. Shunt catheter is again noted on the right. Aortic calcifications are again seen and stable. The lungs are well aerated bilaterally without focal confluent infiltrate. Small bilateral pleural effusions are noted. These have increased slightly in the interval from the prior exam. Mild central vascular congestion is noted likely related to a component of fluid overload. IMPRESSION: Mild vascular congestion small effusions. A portion of this is likely related to some volume overload given the patient's clinical history. Electronically Signed   By: Inez Catalina M.D.   On: 10/26/2017 17:55   Ct Head Wo Contrast  Result Date: 11/02/2017 CLINICAL DATA:  Altered mental status at dialysis EXAM: CT HEAD WITHOUT CONTRAST TECHNIQUE: Contiguous axial images were obtained from the base of the skull through the vertex without intravenous contrast. COMPARISON:  CT 10/15/2017 FINDINGS: Brain: There is a right parietal approach shunt catheter with tip in the right lateral ventricle. The size and configuration of the ventricles are unchanged. There is no acute hemorrhage or evidence of acute cortical infarct. Prominent extra-axial CSF spaces are unchanged. There is severe periventricular hypoattenuation consistent with chronic small vessel ischemia. No midline shift or other mass effect. Old bilateral basal ganglia lacunar infarcts. Vascular: No hyperdense vessel or unexpected calcification. Skull: Normal visualized skull base, calvarium and extracranial soft tissues. Sinuses/Orbits:  Bilateral maxillary retention cysts. Normal orbits. IMPRESSION: 1. No acute intracranial abnormality. 2. Unchanged size and configuration of the shunted ventricles. 3. Severe atrophy and white matter disease consistent with chronic ischemic microangiopathy. Electronically Signed   By: Ulyses Jarred M.D.   On: 11/01/2017 21:55   Ct Chest Wo Contrast  Result Date: 11/06/2017 CLINICAL DATA:  Fever of unknown origin.  Pleural effusion. EXAM: CT CHEST WITHOUT CONTRAST TECHNIQUE: Multidetector CT imaging of the chest was performed following the standard protocol without IV contrast. COMPARISON:  Radiographs yesterday. FINDINGS: Cardiovascular: Dense aortic atherosclerosis, normal caliber aorta. Multi chamber cardiomegaly with coronary artery calcifications. Minimal pericardial fluid. Mediastinum/Nodes: Shotty mediastinal nodes, increased in number abuts subcentimeter. The esophagus is decompressed. Visualized thyroid gland is normal. Lungs/Pleura: Mild emphysema. Mild septal thickening consistent pulmonary edema. Patchy ground-glass opacities throughout the left lung and perihilar right upper lobe. More consolidative opacities in the lingula and left upper lobe abutting the fissure and dependent right lower lobe. There is bronchial thickening. No definite endotracheal or endobronchial debris. Small bilateral pleural effusions, greater on the left. Calcified granuloma in the left upper lobe. Upper Abdomen: Upper normal spleen size. Vascular calcifications of abdominal vasculature. Atrophic kidneys. Musculoskeletal: Shunt catheter tubing in the right anterior chest wall. Degenerative change in the spine. There are no acute or suspicious osseous abnormalities. IMPRESSION: 1. Consolidations in the dependent right lower lobe and perifissural left upper lobe and lingula, may be atelectasis, pneumonia, or possibly aspiration. 2. Cardiomegaly with mild septal thickening/ pulmonary edema. Ground-glass opacities in the left  lung and right upper lobe, likely alveolar pulmonary edema, less likely infectious pneumonitis. Small  bilateral pleural effusions. 3. Aortic atherosclerosis coronary artery calcifications. Mild emphysema. Aortic Atherosclerosis (ICD10-I70.0) and Emphysema (ICD10-J43.9). Electronically Signed   By: Jeb Levering M.D.   On: 11/06/2017 02:55   Mr Brain Wo Contrast  Result Date: 11/06/2017 CLINICAL DATA:  72 y/o  M; patient coded during dialysis. EXAM: MRI HEAD WITHOUT CONTRAST TECHNIQUE: Multiplanar, multiecho pulse sequences of the brain and surrounding structures were obtained without intravenous contrast. COMPARISON:  10/23/2017 CT of the head. FINDINGS: Brain: No acute infarction, hemorrhage, hydrocephalus, extra-axial collection or mass lesion. Diffuse nonspecific T2 FLAIR hyperintense signal abnormality of white matter compatible with severe chronic microvascular ischemic changes. Severe brain parenchymal volume loss and diffuse prominent perivascular spaces. Small hemosiderin stained chronic lacunar infarctions within the caudate heads bilaterally. Stable foci of chronic microhemorrhage within the left putamen and right splenium of corpus callosum. Right parietal approach ventriculostomy catheter with tip in the right lateral ventricle. No hydrocephalus. Vascular: Loss of normal flow void in the right transverse sinus. Skull and upper cervical spine: Normal marrow signal. Sinuses/Orbits: Fluid and debris within the oropharynx. Multiple mucous retention cysts within the maxillary sinuses bilaterally and mild diffuse paranasal sinus mucosal thickening. No abnormal signal of mastoid air cells. Bilateral intra-ocular lens replacement. Other: None. IMPRESSION: 1. No acute/early subacute infarct, hemorrhage, or focal mass effect. 2. Severe chronic microvascular ischemic changes and parenchymal volume loss of the brain. Small chronic lacunar infarcts in the basal ganglia bilaterally. 3. Right parietal approach  ventriculostomy catheter. No hydrocephalus. 4. Mild paranasal sinus disease. 5. Loss of normal flow void in right transverse sinus, probably slow flow, less likely thrombosis. This can be further assessed with CT or MRI venogram of the head. These results will be called to the ordering clinician or representative by the Radiologist Assistant, and communication documented in the PACS or zVision Dashboard. Electronically Signed   By: Kristine Garbe M.D.   On: 11/06/2017 22:41   Dg Chest Port 1 View  Result Date: 11/12/2017 CLINICAL DATA:  Acute respiratory failure. EXAM: PORTABLE CHEST 1 VIEW COMPARISON:  11/09/2017. FINDINGS: Mild cardiac enlargement is stable. Support tubes and lines including ETT, and nasogastric tube grossly stable. Ventriculoperitoneal shunt tubing overlies the RIGHT chest, with some apparent discontinuity of the more lateral tube, likely abandoned revision. There is no consolidation or edema. No pneumothorax is evident. IMPRESSION: Mild cardiac enlargement. No consolidation or edema. Support tubes and lines appear stable. Electronically Signed   By: Staci Righter M.D.   On: 11/12/2017 07:13   Dg Chest Port 1 View  Result Date: 11/09/2017 CLINICAL DATA:  Acute respiratory failure. EXAM: PORTABLE CHEST 1 VIEW COMPARISON:  Radiograph of November 08, 2017. FINDINGS: Stable cardiomediastinal silhouette. Endotracheal and nasogastric tubes are unchanged in position. No pneumothorax or pleural effusion is noted. Right sided ventriculoperitoneal shunt is again noted. No pneumothorax or pleural effusion is noted. Bony thorax is unremarkable. IMPRESSION: Stable support apparatus. No acute cardiopulmonary abnormality seen. Electronically Signed   By: Marijo Conception, M.D.   On: 11/09/2017 09:19   Dg Chest Port 1 View  Result Date: 11/08/2017 CLINICAL DATA:  Acute respiratory failure EXAM: PORTABLE CHEST 1 VIEW COMPARISON:  11/07/2017 FINDINGS: Endotracheal tube in good position.  Gastric tube enters the stomach with the tip not visualized. VP shunt tubing overlying the right chest unchanged Right lower lobe airspace disease with mild progression. Possible pneumonia or atelectasis. Negative for heart failure or effusion IMPRESSION: Endotracheal tube remains in good position Mild progression of right lower lobe atelectasis/ pneumonia.  Electronically Signed   By: Franchot Gallo M.D.   On: 11/08/2017 06:57   Dg Chest Port 1 View  Result Date: 11/07/2017 CLINICAL DATA:  72 year old male with fever of unknown origin. Cough and altered mental status. EXAM: PORTABLE CHEST 1 VIEW COMPARISON:  11/06/2017 CT, radiographs, and earlier. FINDINGS: Portable AP semi upright view at 0427 hours. Endotracheal tube tip in good position between the clavicles and carina. Enteric tube courses to the abdomen, tip not included. Right neck, chest and abdomen shunt catheter re- demonstrated. Stable cardiomegaly and mediastinal contours. No pneumothorax. Regressed pulmonary vascularity since 10/21/2017. Patchy right lung base consolidation and pleural effusions are not evident today. Mildly improved perihilar ventilation since 11/06/2017. No areas of worsening ventilation. Paucity of upper abdominal bowel gas. IMPRESSION: 1.  Stable lines and tubes. 2. Regressed pulmonary edema since 10/22/2017. Right lower lobe consolidation and left greater than right pleural effusions seen by CT yesterday are not evident. No areas of worsening ventilation. Electronically Signed   By: Genevie Ann M.D.   On: 11/07/2017 06:51   Dg Chest Port 1 View  Result Date: 11/06/2017 CLINICAL DATA:  Intubation. EXAM: PORTABLE CHEST 1 VIEW COMPARISON:  11/06/2017 chest CT and 10/16/2017 chest radiograph FINDINGS: An endotracheal tube with tip 2.5 cm above the carina and NG tube with tip overlying the mid stomach noted. Cardiomegaly with pulmonary vascular congestion again identified. Bibasilar atelectasis, right greater than left noted. The  left pleural effusion identified on today's CT is difficult to visualize radiographically. There is no evidence of pneumothorax. Ventriculoperitoneal shunt catheters are again noted. IMPRESSION: Endotracheal tube with tip 2.5 cm above the carina and NG tube with tip overlying the mid stomach. Cardiomegaly, pulmonary vascular congestion and bibasilar atelectasis, right greater than left. Electronically Signed   By: Margarette Canada M.D.   On: 11/06/2017 19:38   Dg Abd Portable 1v  Result Date: 11/12/2017 CLINICAL DATA:  Nausea, vomiting. EXAM: PORTABLE ABDOMEN - 1 VIEW COMPARISON:  Radiograph of November 06, 2017. FINDINGS: Distal tip of nasogastric tube is seen in expected position of distal stomach or proximal duodenum. Right-sided ventricular peritoneal shunt is in good position. No abnormal bowel dilatation is noted. IMPRESSION: Distal tip of nasogastric tube seen in expected position of distal stomach or proximal duodenum. No evidence of bowel obstruction or ileus. Electronically Signed   By: Marijo Conception, M.D.   On: 11/12/2017 11:38   Dg Abd Portable 1v  Result Date: 11/06/2017 CLINICAL DATA:  NG tube placement. EXAM: PORTABLE ABDOMEN - 1 VIEW COMPARISON:  None. FINDINGS: An NG tube is identified with tip overlying the mid stomach. VP shunt catheter again noted. Bowel gas pattern is unremarkable. IMPRESSION: NG tube with tip overlying the mid stomach. Electronically Signed   By: Margarette Canada M.D.   On: 11/06/2017 19:38   Dg Fluoro Guide Lumbar Puncture  Result Date: 11/06/2017 CLINICAL DATA:  Altered mental status.  Concern for meningitis. EXAM: DIAGNOSTIC LUMBAR PUNCTURE UNDER FLUOROSCOPIC GUIDANCE FLUOROSCOPY TIME:  Radiation Exposure Index (as provided by the fluoroscopic device): 11.4 mGy If the device does not provide the exposure index: Fluoroscopy Time (in minutes and seconds):  1 minutes 36 seconds Number of Acquired Images:  1 PROCEDURE: Informed consent was obtained from the patient prior  to the procedure, including potential complications of headache, allergy, and pain. With the patient prone, the lower back was prepped with Betadine. 1% Lidocaine was used for local anesthesia. Lumbar puncture was performed at the L3-L4 level using a 20  gauge needle with return of straw-colored CSF with an estimated opening pressure of 15 cm water. 8 ml of CSF were obtained for laboratory studies. The patient tolerated the procedure well and there were no apparent complications. IMPRESSION: Successful lumbar puncture with straw-colored CSF. Electronically Signed   By: Suzy Bouchard M.D.   On: 11/06/2017 11:29    Microbiology No results found for this or any previous visit (from the past 240 hour(s)).  Lab Basic Metabolic Panel: Recent Labs  Lab 11/11/17 0518 11/12/17 0004 11/13/17 0310 11/14/17 0254  NA 137 139 138 139  K 3.6 4.0 4.4 4.8  CL 99* 101 94* 96*  CO2 21* 23 25 21*  GLUCOSE 104* 112* 87 81  BUN 92* 119* 78* 113*  CREATININE 8.46* 9.96* 7.39* 9.48*  CALCIUM 8.7* 8.5* 9.2 9.1  MG 2.3 2.3  --   --   PHOS 6.1* 6.2*  6.3*  --   --    Liver Function Tests: Recent Labs  Lab 11/12/17 0004  ALBUMIN 2.1*   No results for input(s): LIPASE, AMYLASE in the last 168 hours. No results for input(s): AMMONIA in the last 168 hours. CBC: Recent Labs  Lab 11/11/17 0518 11/12/17 0004 11/13/17 0310 11/14/17 0254  WBC 6.2 6.0 6.4 6.1  HGB 8.2* 8.2* 9.1* 8.6*  HCT 27.6* 27.2* 29.7* 28.9*  MCV 94.5 94.8 94.9 95.7  PLT 77* 74* 86* 90*   Cardiac Enzymes: No results for input(s): CKTOTAL, CKMB, CKMBINDEX, TROPONINI in the last 168 hours. Sepsis Labs: Recent Labs  Lab 11/11/17 0518 11/12/17 0004 11/13/17 0310 11/14/17 0254  WBC 6.2 6.0 6.4 6.1    Procedures/Operations  Intubation, extubation Hemodialysis   Ripudeep Rai 12-14-2017, 6:45 PM

## 2017-12-11 DEATH — deceased
